# Patient Record
Sex: Male | Born: 1935
Health system: Southern US, Community
[De-identification: ages and names within clinical notes are randomized; demographics above are authoritative.]

## PROBLEM LIST (undated history)

## (undated) DIAGNOSIS — G629 Polyneuropathy, unspecified: Secondary | ICD-10-CM

## (undated) DIAGNOSIS — I714 Abdominal aortic aneurysm, without rupture, unspecified: Secondary | ICD-10-CM

## (undated) DIAGNOSIS — J219 Acute bronchiolitis, unspecified: Secondary | ICD-10-CM

## (undated) DIAGNOSIS — I219 Acute myocardial infarction, unspecified: Secondary | ICD-10-CM

## (undated) DIAGNOSIS — K4090 Unilateral inguinal hernia, without obstruction or gangrene, not specified as recurrent: Secondary | ICD-10-CM

## (undated) DIAGNOSIS — K635 Polyp of colon: Secondary | ICD-10-CM

## (undated) DIAGNOSIS — M1990 Unspecified osteoarthritis, unspecified site: Secondary | ICD-10-CM

## (undated) DIAGNOSIS — J449 Chronic obstructive pulmonary disease, unspecified: Secondary | ICD-10-CM

## (undated) DIAGNOSIS — N529 Male erectile dysfunction, unspecified: Secondary | ICD-10-CM

## (undated) DIAGNOSIS — G5602 Carpal tunnel syndrome, left upper limb: Secondary | ICD-10-CM

## (undated) DIAGNOSIS — R0600 Dyspnea, unspecified: Secondary | ICD-10-CM

## (undated) DIAGNOSIS — E785 Hyperlipidemia, unspecified: Secondary | ICD-10-CM

## (undated) DIAGNOSIS — Z923 Personal history of irradiation: Secondary | ICD-10-CM

## (undated) DIAGNOSIS — I10 Essential (primary) hypertension: Secondary | ICD-10-CM

## (undated) DIAGNOSIS — I2510 Atherosclerotic heart disease of native coronary artery without angina pectoris: Secondary | ICD-10-CM

## (undated) DIAGNOSIS — H3530 Unspecified macular degeneration: Secondary | ICD-10-CM

## (undated) HISTORY — DX: Essential (primary) hypertension: I10

## (undated) HISTORY — DX: Polyneuropathy, unspecified: G62.9

## (undated) HISTORY — PX: INCISION AND DRAINAGE PERIRECTAL ABSCESS: SHX1804

## (undated) HISTORY — DX: Abdominal aortic aneurysm, without rupture, unspecified: I71.40

## (undated) HISTORY — PX: COLONOSCOPY: SHX174

## (undated) HISTORY — DX: Hyperlipidemia, unspecified: E78.5

## (undated) HISTORY — PX: INGUINAL HERNIA REPAIR: SHX194

## (undated) HISTORY — PX: OTHER SURGICAL HISTORY: SHX169

## (undated) HISTORY — DX: Male erectile dysfunction, unspecified: N52.9

## (undated) HISTORY — DX: Atherosclerotic heart disease of native coronary artery without angina pectoris: I25.10

## (undated) HISTORY — PX: HERNIA REPAIR: SHX51

## (undated) HISTORY — DX: Polyp of colon: K63.5

## (undated) HISTORY — DX: Acute bronchiolitis, unspecified: J21.9

## (undated) HISTORY — DX: Chronic obstructive pulmonary disease, unspecified: J44.9

## (undated) HISTORY — PX: EYE SURGERY: SHX253

## (undated) HISTORY — DX: Unspecified macular degeneration: H35.30

## (undated) HISTORY — DX: Acute myocardial infarction, unspecified: I21.9

## (undated) HISTORY — DX: Personal history of irradiation: Z92.3

## (undated) HISTORY — DX: Abdominal aortic aneurysm, without rupture: I71.4

---

## 1998-09-28 ENCOUNTER — Encounter: Admission: RE | Admit: 1998-09-28 | Discharge: 1998-12-27 | Payer: Self-pay | Admitting: Internal Medicine

## 1999-09-05 ENCOUNTER — Ambulatory Visit (HOSPITAL_BASED_OUTPATIENT_CLINIC_OR_DEPARTMENT_OTHER): Admission: RE | Admit: 1999-09-05 | Discharge: 1999-09-05 | Payer: Self-pay | Admitting: Orthopedic Surgery

## 2000-03-19 ENCOUNTER — Ambulatory Visit (HOSPITAL_BASED_OUTPATIENT_CLINIC_OR_DEPARTMENT_OTHER): Admission: RE | Admit: 2000-03-19 | Discharge: 2000-03-19 | Payer: Self-pay | Admitting: Orthopedic Surgery

## 2001-12-17 ENCOUNTER — Encounter: Payer: Self-pay | Admitting: Internal Medicine

## 2001-12-17 ENCOUNTER — Ambulatory Visit (HOSPITAL_COMMUNITY): Admission: RE | Admit: 2001-12-17 | Discharge: 2001-12-17 | Payer: Self-pay | Admitting: Internal Medicine

## 2003-12-31 ENCOUNTER — Ambulatory Visit: Payer: Self-pay | Admitting: Internal Medicine

## 2004-02-17 ENCOUNTER — Ambulatory Visit: Payer: Self-pay | Admitting: Internal Medicine

## 2004-03-30 ENCOUNTER — Ambulatory Visit: Payer: Self-pay | Admitting: Internal Medicine

## 2004-05-26 ENCOUNTER — Ambulatory Visit: Payer: Self-pay | Admitting: Internal Medicine

## 2004-07-28 ENCOUNTER — Ambulatory Visit: Payer: Self-pay | Admitting: Internal Medicine

## 2004-11-30 ENCOUNTER — Ambulatory Visit: Payer: Self-pay | Admitting: Internal Medicine

## 2004-12-07 ENCOUNTER — Ambulatory Visit: Payer: Self-pay | Admitting: Internal Medicine

## 2005-01-15 ENCOUNTER — Ambulatory Visit: Payer: Self-pay | Admitting: Internal Medicine

## 2005-05-02 ENCOUNTER — Ambulatory Visit: Payer: Self-pay | Admitting: Internal Medicine

## 2005-05-08 ENCOUNTER — Ambulatory Visit: Payer: Self-pay | Admitting: Internal Medicine

## 2005-09-07 ENCOUNTER — Ambulatory Visit: Payer: Self-pay | Admitting: Internal Medicine

## 2005-09-18 ENCOUNTER — Ambulatory Visit: Payer: Self-pay | Admitting: Internal Medicine

## 2006-01-22 ENCOUNTER — Ambulatory Visit: Payer: Self-pay | Admitting: Internal Medicine

## 2006-01-22 LAB — CONVERTED CEMR LAB
BUN: 13 mg/dL (ref 6–23)
Potassium: 4.5 meq/L (ref 3.5–5.1)
Uric Acid, Serum: 5.5 mg/dL (ref 2.4–7.0)

## 2006-01-29 ENCOUNTER — Ambulatory Visit: Payer: Self-pay | Admitting: Internal Medicine

## 2006-05-22 ENCOUNTER — Ambulatory Visit: Payer: Self-pay | Admitting: Internal Medicine

## 2006-05-24 ENCOUNTER — Ambulatory Visit: Payer: Self-pay | Admitting: Internal Medicine

## 2006-05-29 ENCOUNTER — Ambulatory Visit: Payer: Self-pay | Admitting: Internal Medicine

## 2006-05-29 LAB — CONVERTED CEMR LAB
Hgb A1c MFr Bld: 6.1 % — ABNORMAL HIGH (ref 4.6–6.0)
Uric Acid, Serum: 5.2 mg/dL (ref 2.4–7.0)

## 2006-09-20 ENCOUNTER — Ambulatory Visit: Payer: Self-pay | Admitting: Internal Medicine

## 2006-09-23 DIAGNOSIS — Z8739 Personal history of other diseases of the musculoskeletal system and connective tissue: Secondary | ICD-10-CM

## 2006-09-24 ENCOUNTER — Ambulatory Visit: Payer: Self-pay | Admitting: Internal Medicine

## 2006-09-24 DIAGNOSIS — E785 Hyperlipidemia, unspecified: Secondary | ICD-10-CM | POA: Insufficient documentation

## 2006-09-24 DIAGNOSIS — E1169 Type 2 diabetes mellitus with other specified complication: Secondary | ICD-10-CM | POA: Insufficient documentation

## 2006-09-24 DIAGNOSIS — R7303 Prediabetes: Secondary | ICD-10-CM

## 2006-09-24 LAB — CONVERTED CEMR LAB
Cholesterol, target level: 200 mg/dL
HDL goal, serum: 40 mg/dL
LDL Goal: 100 mg/dL

## 2006-09-30 ENCOUNTER — Telehealth (INDEPENDENT_AMBULATORY_CARE_PROVIDER_SITE_OTHER): Payer: Self-pay | Admitting: *Deleted

## 2006-10-02 LAB — CONVERTED CEMR LAB
Basophils Relative: 1.1 % — ABNORMAL HIGH (ref 0.0–1.0)
Eosinophils Relative: 3.4 % (ref 0.0–5.0)
Folate: 10.5 ng/mL
HCT: 40.3 % (ref 39.0–52.0)
Iron: 75 ug/dL (ref 42–165)
Neutrophils Relative %: 50.7 % (ref 43.0–77.0)
RBC: 4.33 M/uL (ref 4.22–5.81)
RDW: 13.2 % (ref 11.5–14.6)
Transferrin: 268.6 mg/dL (ref >=212.0)
WBC: 5.6 10*3/uL (ref 4.5–10.5)

## 2006-10-03 ENCOUNTER — Encounter (INDEPENDENT_AMBULATORY_CARE_PROVIDER_SITE_OTHER): Payer: Self-pay | Admitting: *Deleted

## 2006-11-25 ENCOUNTER — Telehealth (INDEPENDENT_AMBULATORY_CARE_PROVIDER_SITE_OTHER): Payer: Self-pay | Admitting: *Deleted

## 2006-12-03 ENCOUNTER — Ambulatory Visit: Payer: Self-pay | Admitting: Internal Medicine

## 2006-12-08 LAB — CONVERTED CEMR LAB
HDL: 42.8 mg/dL (ref >39.0)
Total CK: 69 units/L (ref 7–195)
Triglycerides: 139 mg/dL (ref 0–149)

## 2006-12-09 ENCOUNTER — Encounter (INDEPENDENT_AMBULATORY_CARE_PROVIDER_SITE_OTHER): Payer: Self-pay | Admitting: *Deleted

## 2007-01-01 ENCOUNTER — Ambulatory Visit: Payer: Self-pay | Admitting: Internal Medicine

## 2007-01-01 ENCOUNTER — Telehealth (INDEPENDENT_AMBULATORY_CARE_PROVIDER_SITE_OTHER): Payer: Self-pay | Admitting: *Deleted

## 2007-02-27 ENCOUNTER — Telehealth (INDEPENDENT_AMBULATORY_CARE_PROVIDER_SITE_OTHER): Payer: Self-pay | Admitting: *Deleted

## 2007-03-11 ENCOUNTER — Encounter: Payer: Self-pay | Admitting: Internal Medicine

## 2007-05-22 ENCOUNTER — Telehealth (INDEPENDENT_AMBULATORY_CARE_PROVIDER_SITE_OTHER): Payer: Self-pay | Admitting: *Deleted

## 2007-06-11 ENCOUNTER — Ambulatory Visit: Payer: Self-pay | Admitting: Gastroenterology

## 2007-06-19 ENCOUNTER — Ambulatory Visit: Payer: Self-pay | Admitting: Gastroenterology

## 2007-06-19 ENCOUNTER — Encounter: Payer: Self-pay | Admitting: Internal Medicine

## 2007-07-16 ENCOUNTER — Ambulatory Visit: Payer: Self-pay | Admitting: Internal Medicine

## 2007-07-16 DIAGNOSIS — D126 Benign neoplasm of colon, unspecified: Secondary | ICD-10-CM | POA: Insufficient documentation

## 2007-07-19 LAB — CONVERTED CEMR LAB
ALT: 15 units/L (ref 0–53)
Basophils Absolute: 0 10*3/uL (ref 0.0–0.1)
Basophils Relative: 0.7 % (ref 0.0–1.0)
Bilirubin, Direct: 0.1 mg/dL (ref 0.0–0.3)
CO2: 28 meq/L (ref 19–32)
Calcium: 9.6 mg/dL (ref 8.4–10.5)
Cholesterol: 173 mg/dL (ref 0–200)
Creatinine, Ser: 1.1 mg/dL (ref 0.4–1.5)
Creatinine,U: 179.8 mg/dL
Eosinophils Absolute: 0.3 10*3/uL (ref 0.0–0.7)
GFR calc non Af Amer: 70 mL/min
Hemoglobin: 13.9 g/dL (ref 13.0–17.0)
Hgb A1c MFr Bld: 5.9 % (ref 4.6–6.0)
LDL Cholesterol: 101 mg/dL — ABNORMAL HIGH (ref 0–99)
Lymphocytes Relative: 35.4 % (ref 12.0–46.0)
MCHC: 33.3 g/dL (ref 30.0–36.0)
MCV: 94.9 fL (ref 78.0–100.0)
Microalb Creat Ratio: 17.8 mg/g (ref 0.0–30.0)
Microalb, Ur: 3.2 mg/dL — ABNORMAL HIGH (ref 0.0–1.9)
Neutro Abs: 2.8 10*3/uL (ref 1.4–7.7)
Neutrophils Relative %: 47.8 % (ref 43.0–77.0)
PSA: 1.42 ng/mL (ref 0.10–4.00)
RDW: 13.3 % (ref 11.5–14.6)
Sodium: 140 meq/L (ref 135–145)
Total Bilirubin: 1.1 mg/dL (ref 0.3–1.2)
Triglycerides: 119 mg/dL (ref 0–149)
Uric Acid, Serum: 5.6 mg/dL (ref 4.0–7.8)
VLDL: 24 mg/dL (ref 0–40)

## 2007-07-21 ENCOUNTER — Encounter (INDEPENDENT_AMBULATORY_CARE_PROVIDER_SITE_OTHER): Payer: Self-pay | Admitting: *Deleted

## 2007-08-28 ENCOUNTER — Encounter: Payer: Self-pay | Admitting: Internal Medicine

## 2007-08-28 ENCOUNTER — Telehealth (INDEPENDENT_AMBULATORY_CARE_PROVIDER_SITE_OTHER): Payer: Self-pay | Admitting: *Deleted

## 2007-12-18 ENCOUNTER — Telehealth (INDEPENDENT_AMBULATORY_CARE_PROVIDER_SITE_OTHER): Payer: Self-pay | Admitting: *Deleted

## 2008-02-16 ENCOUNTER — Telehealth (INDEPENDENT_AMBULATORY_CARE_PROVIDER_SITE_OTHER): Payer: Self-pay | Admitting: *Deleted

## 2008-07-15 ENCOUNTER — Ambulatory Visit: Payer: Self-pay | Admitting: Internal Medicine

## 2008-07-15 DIAGNOSIS — M545 Low back pain, unspecified: Secondary | ICD-10-CM | POA: Insufficient documentation

## 2008-07-16 ENCOUNTER — Encounter (INDEPENDENT_AMBULATORY_CARE_PROVIDER_SITE_OTHER): Payer: Self-pay | Admitting: *Deleted

## 2008-07-16 LAB — CONVERTED CEMR LAB
Hgb A1c MFr Bld: 6.2 % (ref 4.6–6.5)
Microalb Creat Ratio: 3.6 mg/g (ref 0.0–30.0)

## 2008-07-23 ENCOUNTER — Telehealth (INDEPENDENT_AMBULATORY_CARE_PROVIDER_SITE_OTHER): Payer: Self-pay | Admitting: *Deleted

## 2008-07-26 ENCOUNTER — Ambulatory Visit: Payer: Self-pay | Admitting: Internal Medicine

## 2008-07-26 DIAGNOSIS — M542 Cervicalgia: Secondary | ICD-10-CM | POA: Insufficient documentation

## 2008-08-20 ENCOUNTER — Encounter: Payer: Self-pay | Admitting: Internal Medicine

## 2008-11-17 ENCOUNTER — Ambulatory Visit: Payer: Self-pay | Admitting: Internal Medicine

## 2008-11-17 DIAGNOSIS — IMO0002 Reserved for concepts with insufficient information to code with codable children: Secondary | ICD-10-CM

## 2008-12-01 ENCOUNTER — Ambulatory Visit: Payer: Self-pay | Admitting: Internal Medicine

## 2008-12-09 ENCOUNTER — Ambulatory Visit: Payer: Self-pay | Admitting: Internal Medicine

## 2008-12-28 ENCOUNTER — Ambulatory Visit: Payer: Self-pay | Admitting: Internal Medicine

## 2008-12-28 DIAGNOSIS — G479 Sleep disorder, unspecified: Secondary | ICD-10-CM

## 2009-01-11 LAB — CONVERTED CEMR LAB
AST: 15 units/L (ref 0–37)
BUN: 12 mg/dL (ref 6–23)
Cholesterol: 148 mg/dL (ref 0–200)
HDL: 43.9 mg/dL (ref >39.00)
Hgb A1c MFr Bld: 6.4 % (ref 4.6–6.5)
LDL Cholesterol: 91 mg/dL (ref 0–99)
Triglycerides: 65 mg/dL (ref 0.0–149.0)
VLDL: 13 mg/dL (ref 0.0–40.0)

## 2009-02-08 ENCOUNTER — Telehealth (INDEPENDENT_AMBULATORY_CARE_PROVIDER_SITE_OTHER): Payer: Self-pay | Admitting: *Deleted

## 2009-03-31 ENCOUNTER — Encounter: Payer: Self-pay | Admitting: Internal Medicine

## 2009-05-12 ENCOUNTER — Telehealth: Payer: Self-pay | Admitting: Internal Medicine

## 2009-06-06 ENCOUNTER — Encounter: Payer: Self-pay | Admitting: Internal Medicine

## 2009-07-08 ENCOUNTER — Ambulatory Visit: Payer: Self-pay | Admitting: Internal Medicine

## 2009-07-11 LAB — CONVERTED CEMR LAB
Creatinine,U: 288.9 mg/dL
Hgb A1c MFr Bld: 6 % (ref 4.6–6.5)
Microalb Creat Ratio: 0.5 mg/g (ref 0.0–30.0)

## 2009-07-20 ENCOUNTER — Ambulatory Visit: Payer: Self-pay | Admitting: Internal Medicine

## 2009-07-20 DIAGNOSIS — I714 Abdominal aortic aneurysm, without rupture, unspecified: Secondary | ICD-10-CM | POA: Insufficient documentation

## 2009-09-22 ENCOUNTER — Encounter: Payer: Self-pay | Admitting: Internal Medicine

## 2009-11-24 ENCOUNTER — Ambulatory Visit: Payer: Self-pay | Admitting: Internal Medicine

## 2009-11-28 ENCOUNTER — Encounter: Payer: Self-pay | Admitting: Internal Medicine

## 2010-02-19 HISTORY — PX: CORONARY ARTERY BYPASS GRAFT: SHX141

## 2010-02-27 ENCOUNTER — Emergency Department (HOSPITAL_COMMUNITY)
Admission: EM | Admit: 2010-02-27 | Discharge: 2010-02-27 | Payer: Self-pay | Source: Home / Self Care | Admitting: Emergency Medicine

## 2010-02-27 ENCOUNTER — Inpatient Hospital Stay (HOSPITAL_COMMUNITY)
Admission: EM | Admit: 2010-02-27 | Discharge: 2010-03-11 | Payer: Self-pay | Source: Home / Self Care | Attending: Cardiothoracic Surgery | Admitting: Cardiothoracic Surgery

## 2010-02-28 ENCOUNTER — Encounter: Payer: Self-pay | Admitting: Cardiovascular Disease

## 2010-02-28 ENCOUNTER — Encounter: Payer: Self-pay | Admitting: Cardiothoracic Surgery

## 2010-03-01 ENCOUNTER — Encounter: Payer: Self-pay | Admitting: Cardiothoracic Surgery

## 2010-03-03 DIAGNOSIS — I219 Acute myocardial infarction, unspecified: Secondary | ICD-10-CM

## 2010-03-03 HISTORY — DX: Acute myocardial infarction, unspecified: I21.9

## 2010-03-03 HISTORY — PX: OTHER SURGICAL HISTORY: SHX169

## 2010-03-06 LAB — POCT I-STAT 4, (NA,K, GLUC, HGB,HCT)
Glucose, Bld: 136 mg/dL — ABNORMAL HIGH (ref 70–99)
Glucose, Bld: 114 mg/dL — ABNORMAL HIGH (ref 70–99)
Glucose, Bld: 98 mg/dL (ref 70–99)
Glucose, Bld: 113 mg/dL — ABNORMAL HIGH (ref 70–99)
Glucose, Bld: 147 mg/dL — ABNORMAL HIGH (ref 70–99)
Glucose, Bld: 96 mg/dL (ref 70–99)
HCT: 32 % — ABNORMAL LOW (ref 39.0–52.0)
HCT: 26 % — ABNORMAL LOW (ref 39.0–52.0)
HCT: 29 % — ABNORMAL LOW (ref 39.0–52.0)
HCT: 26 % — ABNORMAL LOW (ref 39.0–52.0)
HCT: 26 % — ABNORMAL LOW (ref 39.0–52.0)
HCT: 27 % — ABNORMAL LOW (ref 39.0–52.0)
Hemoglobin: 10.9 g/dL — ABNORMAL LOW (ref 13.0–17.0)
Hemoglobin: 8.8 g/dL — ABNORMAL LOW (ref 13.0–17.0)
Hemoglobin: 9.9 g/dL — ABNORMAL LOW (ref 13.0–17.0)
Hemoglobin: 8.8 g/dL — ABNORMAL LOW (ref 13.0–17.0)
Hemoglobin: 8.8 g/dL — ABNORMAL LOW (ref 13.0–17.0)
Hemoglobin: 9.2 g/dL — ABNORMAL LOW (ref 13.0–17.0)
Potassium: 3.8 mEq/L (ref 3.5–5.1)
Potassium: 3.8 mEq/L (ref 3.5–5.1)
Potassium: 4.1 mEq/L (ref 3.5–5.1)
Potassium: 4.7 mEq/L (ref 3.5–5.1)
Potassium: 4.6 mEq/L (ref 3.5–5.1)
Potassium: 4 mEq/L (ref 3.5–5.1)
Sodium: 140 mEq/L (ref 135–145)
Sodium: 137 mEq/L (ref 135–145)
Sodium: 140 mEq/L (ref 135–145)
Sodium: 138 mEq/L (ref 135–145)
Sodium: 135 mEq/L (ref 135–145)
Sodium: 138 mEq/L (ref 135–145)

## 2010-03-06 LAB — CBC
HCT: 41.4 % (ref 39.0–52.0)
HCT: 37 % — ABNORMAL LOW (ref 39.0–52.0)
HCT: 37.4 % — ABNORMAL LOW (ref 39.0–52.0)
HCT: 39.4 % (ref 39.0–52.0)
HCT: 38.9 % — ABNORMAL LOW (ref 39.0–52.0)
HCT: 26.7 % — ABNORMAL LOW (ref 39.0–52.0)
HCT: 27.9 % — ABNORMAL LOW (ref 39.0–52.0)
HCT: 28.6 % — ABNORMAL LOW (ref 39.0–52.0)
HCT: 41.1 % (ref 39.0–52.0)
HCT: 26.7 % — ABNORMAL LOW (ref 39.0–52.0)
Hemoglobin: 13.9 g/dL (ref 13.0–17.0)
Hemoglobin: 12.5 g/dL — ABNORMAL LOW (ref 13.0–17.0)
Hemoglobin: 12.6 g/dL — ABNORMAL LOW (ref 13.0–17.0)
Hemoglobin: 13.3 g/dL (ref 13.0–17.0)
Hemoglobin: 13.3 g/dL (ref 13.0–17.0)
Hemoglobin: 9.1 g/dL — ABNORMAL LOW (ref 13.0–17.0)
Hemoglobin: 9.6 g/dL — ABNORMAL LOW (ref 13.0–17.0)
Hemoglobin: 9.7 g/dL — ABNORMAL LOW (ref 13.0–17.0)
Hemoglobin: 13.7 g/dL (ref 13.0–17.0)
Hemoglobin: 9 g/dL — ABNORMAL LOW (ref 13.0–17.0)
MCH: 30.5 pg (ref 26.0–34.0)
MCH: 30.9 pg (ref 26.0–34.0)
MCH: 30.9 pg (ref 26.0–34.0)
MCH: 30.9 pg (ref 26.0–34.0)
MCH: 31.4 pg (ref 26.0–34.0)
MCH: 31 pg (ref 26.0–34.0)
MCH: 31.4 pg (ref 26.0–34.0)
MCH: 30.9 pg (ref 26.0–34.0)
MCH: 30.9 pg (ref 26.0–34.0)
MCH: 31.4 pg (ref 26.0–34.0)
MCHC: 33.6 g/dL (ref 30.0–36.0)
MCHC: 33.8 g/dL (ref 30.0–36.0)
MCHC: 33.7 g/dL (ref 30.0–36.0)
MCHC: 33.8 g/dL (ref 30.0–36.0)
MCHC: 34.2 g/dL (ref 30.0–36.0)
MCHC: 34.1 g/dL (ref 30.0–36.0)
MCHC: 34.4 g/dL (ref 30.0–36.0)
MCHC: 33.9 g/dL (ref 30.0–36.0)
MCHC: 33.3 g/dL (ref 30.0–36.0)
MCHC: 33.7 g/dL (ref 30.0–36.0)
MCV: 90.8 fL (ref 78.0–100.0)
MCV: 91.6 fL (ref 78.0–100.0)
MCV: 91.7 fL (ref 78.0–100.0)
MCV: 91.4 fL (ref 78.0–100.0)
MCV: 91.7 fL (ref 78.0–100.0)
MCV: 90.8 fL (ref 78.0–100.0)
MCV: 91.2 fL (ref 78.0–100.0)
MCV: 91.1 fL (ref 78.0–100.0)
MCV: 92.8 fL (ref 78.0–100.0)
MCV: 93 fL (ref 78.0–100.0)
Platelets: 186 10*3/uL (ref 150–400)
Platelets: 163 10*3/uL (ref 150–400)
Platelets: 163 10*3/uL (ref 150–400)
Platelets: 185 10*3/uL (ref 150–400)
Platelets: 177 10*3/uL (ref 150–400)
Platelets: 98 10*3/uL — ABNORMAL LOW (ref 150–400)
Platelets: 144 10*3/uL — ABNORMAL LOW (ref 150–400)
Platelets: 147 10*3/uL — ABNORMAL LOW (ref 150–400)
Platelets: 59 10*3/uL — ABNORMAL LOW (ref 150–400)
Platelets: 155 10*3/uL (ref 150–400)
RBC: 4.56 MIL/uL (ref 4.22–5.81)
RBC: 4.04 MIL/uL — ABNORMAL LOW (ref 4.22–5.81)
RBC: 4.08 MIL/uL — ABNORMAL LOW (ref 4.22–5.81)
RBC: 4.31 MIL/uL (ref 4.22–5.81)
RBC: 4.24 MIL/uL (ref 4.22–5.81)
RBC: 2.94 MIL/uL — ABNORMAL LOW (ref 4.22–5.81)
RBC: 3.06 MIL/uL — ABNORMAL LOW (ref 4.22–5.81)
RBC: 3.14 MIL/uL — ABNORMAL LOW (ref 4.22–5.81)
RBC: 4.43 MIL/uL (ref 4.22–5.81)
RBC: 2.87 MIL/uL — ABNORMAL LOW (ref 4.22–5.81)
RDW: 12.6 % (ref 11.5–15.5)
RDW: 12.6 % (ref 11.5–15.5)
RDW: 12.6 % (ref 11.5–15.5)
RDW: 12.6 % (ref 11.5–15.5)
RDW: 12.6 % (ref 11.5–15.5)
RDW: 12.5 % (ref 11.5–15.5)
RDW: 12.7 % (ref 11.5–15.5)
RDW: 12.8 % (ref 11.5–15.5)
RDW: 13.2 % (ref 11.5–15.5)
RDW: 13.1 % (ref 11.5–15.5)
WBC: 6.8 10*3/uL (ref 4.0–10.5)
WBC: 7.5 10*3/uL (ref 4.0–10.5)
WBC: 8 10*3/uL (ref 4.0–10.5)
WBC: 6.5 10*3/uL (ref 4.0–10.5)
WBC: 8.4 10*3/uL (ref 4.0–10.5)
WBC: 8.7 10*3/uL (ref 4.0–10.5)
WBC: 10.8 10*3/uL — ABNORMAL HIGH (ref 4.0–10.5)
WBC: 11.1 10*3/uL — ABNORMAL HIGH (ref 4.0–10.5)
WBC: 7.7 10*3/uL (ref 4.0–10.5)
WBC: 10.6 10*3/uL — ABNORMAL HIGH (ref 4.0–10.5)

## 2010-03-06 LAB — CARDIAC PANEL(CRET KIN+CKTOT+MB+TROPI)
CK, MB: 91.8 ng/mL (ref 0.3–4.0)
CK, MB: 133.9 ng/mL (ref 0.3–4.0)
CK, MB: 117.3 ng/mL (ref 0.3–4.0)
Relative Index: 17.8 — ABNORMAL HIGH (ref 0.0–2.5)
Relative Index: 17.3 — ABNORMAL HIGH (ref 0.0–2.5)
Relative Index: 14.7 — ABNORMAL HIGH (ref 0.0–2.5)
Total CK: 517 U/L — ABNORMAL HIGH (ref 7–232)
Total CK: 775 U/L — ABNORMAL HIGH (ref 7–232)
Total CK: 799 U/L — ABNORMAL HIGH (ref 7–232)
Troponin I: 3.24 ng/mL (ref 0.00–0.06)
Troponin I: 9.11 ng/mL (ref 0.00–0.06)
Troponin I: 11.68 ng/mL (ref 0.00–0.06)

## 2010-03-06 LAB — GLUCOSE, CAPILLARY
Glucose-Capillary: 128 mg/dL — ABNORMAL HIGH (ref 70–99)
Glucose-Capillary: 105 mg/dL — ABNORMAL HIGH (ref 70–99)
Glucose-Capillary: 184 mg/dL — ABNORMAL HIGH (ref 70–99)
Glucose-Capillary: 149 mg/dL — ABNORMAL HIGH (ref 70–99)
Glucose-Capillary: 153 mg/dL — ABNORMAL HIGH (ref 70–99)
Glucose-Capillary: 127 mg/dL — ABNORMAL HIGH (ref 70–99)
Glucose-Capillary: 109 mg/dL — ABNORMAL HIGH (ref 70–99)
Glucose-Capillary: 104 mg/dL — ABNORMAL HIGH (ref 70–99)
Glucose-Capillary: 143 mg/dL — ABNORMAL HIGH (ref 70–99)
Glucose-Capillary: 103 mg/dL — ABNORMAL HIGH (ref 70–99)
Glucose-Capillary: 111 mg/dL — ABNORMAL HIGH (ref 70–99)
Glucose-Capillary: 167 mg/dL — ABNORMAL HIGH (ref 70–99)
Glucose-Capillary: 88 mg/dL (ref 70–99)
Glucose-Capillary: 115 mg/dL — ABNORMAL HIGH (ref 70–99)
Glucose-Capillary: 93 mg/dL (ref 70–99)
Glucose-Capillary: 103 mg/dL — ABNORMAL HIGH (ref 70–99)
Glucose-Capillary: 106 mg/dL — ABNORMAL HIGH (ref 70–99)
Glucose-Capillary: 141 mg/dL — ABNORMAL HIGH (ref 70–99)
Glucose-Capillary: 58 mg/dL — ABNORMAL LOW (ref 70–99)
Glucose-Capillary: 65 mg/dL — ABNORMAL LOW (ref 70–99)
Glucose-Capillary: 96 mg/dL (ref 70–99)
Glucose-Capillary: 122 mg/dL — ABNORMAL HIGH (ref 70–99)
Glucose-Capillary: 126 mg/dL — ABNORMAL HIGH (ref 70–99)
Glucose-Capillary: 117 mg/dL — ABNORMAL HIGH (ref 70–99)
Glucose-Capillary: 117 mg/dL — ABNORMAL HIGH (ref 70–99)
Glucose-Capillary: 123 mg/dL — ABNORMAL HIGH (ref 70–99)
Glucose-Capillary: 119 mg/dL — ABNORMAL HIGH (ref 70–99)
Glucose-Capillary: 181 mg/dL — ABNORMAL HIGH (ref 70–99)
Glucose-Capillary: 197 mg/dL — ABNORMAL HIGH (ref 70–99)
Glucose-Capillary: 187 mg/dL — ABNORMAL HIGH (ref 70–99)
Glucose-Capillary: 188 mg/dL — ABNORMAL HIGH (ref 70–99)
Glucose-Capillary: 174 mg/dL — ABNORMAL HIGH (ref 70–99)
Glucose-Capillary: 223 mg/dL — ABNORMAL HIGH (ref 70–99)

## 2010-03-06 LAB — CROSSMATCH
ABO/RH(D): B POS
Antibody Screen: NEGATIVE
Unit division: 0
Unit division: 0

## 2010-03-06 LAB — BASIC METABOLIC PANEL
BUN: 12 mg/dL (ref 6–23)
BUN: 15 mg/dL (ref 6–23)
BUN: 14 mg/dL (ref 6–23)
BUN: 10 mg/dL (ref 6–23)
BUN: 28 mg/dL — ABNORMAL HIGH (ref 6–23)
CO2: 23 mEq/L (ref 19–32)
CO2: 24 mEq/L (ref 19–32)
CO2: 25 mEq/L (ref 19–32)
CO2: 21 mEq/L (ref 19–32)
CO2: 24 mEq/L (ref 19–32)
Calcium: 8.6 mg/dL (ref 8.4–10.5)
Calcium: 8.7 mg/dL (ref 8.4–10.5)
Calcium: 9 mg/dL (ref 8.4–10.5)
Calcium: 7.8 mg/dL — ABNORMAL LOW (ref 8.4–10.5)
Calcium: 8 mg/dL — ABNORMAL LOW (ref 8.4–10.5)
Chloride: 104 mEq/L (ref 96–112)
Chloride: 106 mEq/L (ref 96–112)
Chloride: 109 mEq/L (ref 96–112)
Chloride: 109 mEq/L (ref 96–112)
Chloride: 106 mEq/L (ref 96–112)
Creatinine, Ser: 1.3 mg/dL (ref 0.4–1.5)
Creatinine, Ser: 1.35 mg/dL (ref 0.4–1.5)
Creatinine, Ser: 1.41 mg/dL (ref 0.4–1.5)
Creatinine, Ser: 1.29 mg/dL (ref 0.4–1.5)
Creatinine, Ser: 2.08 mg/dL — ABNORMAL HIGH (ref 0.4–1.5)
GFR calc Af Amer: >60 mL/min (ref >60)
GFR calc Af Amer: >60 mL/min (ref >60)
GFR calc Af Amer: 59 mL/min — ABNORMAL LOW (ref >60)
GFR calc Af Amer: >60 mL/min (ref >60)
GFR calc Af Amer: 38 mL/min — ABNORMAL LOW (ref >60)
GFR calc non Af Amer: 54 mL/min — ABNORMAL LOW (ref >60)
GFR calc non Af Amer: 51 mL/min — ABNORMAL LOW (ref >60)
GFR calc non Af Amer: 49 mL/min — ABNORMAL LOW (ref >60)
GFR calc non Af Amer: 54 mL/min — ABNORMAL LOW (ref >60)
GFR calc non Af Amer: 31 mL/min — ABNORMAL LOW (ref >60)
Glucose, Bld: 106 mg/dL — ABNORMAL HIGH (ref 70–99)
Glucose, Bld: 98 mg/dL (ref 70–99)
Glucose, Bld: 129 mg/dL — ABNORMAL HIGH (ref 70–99)
Glucose, Bld: 127 mg/dL — ABNORMAL HIGH (ref 70–99)
Glucose, Bld: 174 mg/dL — ABNORMAL HIGH (ref 70–99)
Potassium: 3.7 mEq/L (ref 3.5–5.1)
Potassium: 3.8 mEq/L (ref 3.5–5.1)
Potassium: 4 mEq/L (ref 3.5–5.1)
Potassium: 4.3 mEq/L (ref 3.5–5.1)
Potassium: 4.5 mEq/L (ref 3.5–5.1)
Sodium: 136 mEq/L (ref 135–145)
Sodium: 139 mEq/L (ref 135–145)
Sodium: 140 mEq/L (ref 135–145)
Sodium: 138 mEq/L (ref 135–145)
Sodium: 137 mEq/L (ref 135–145)

## 2010-03-06 LAB — COMPREHENSIVE METABOLIC PANEL
ALT: 14 U/L (ref 0–53)
ALT: 21 U/L (ref 0–53)
AST: 21 U/L (ref 0–37)
AST: 68 U/L — ABNORMAL HIGH (ref 0–37)
Albumin: 3.8 g/dL (ref 3.5–5.2)
Albumin: 3 g/dL — ABNORMAL LOW (ref 3.5–5.2)
Alkaline Phosphatase: 64 U/L (ref 39–117)
Alkaline Phosphatase: 44 U/L (ref 39–117)
BUN: 22 mg/dL (ref 6–23)
BUN: 14 mg/dL (ref 6–23)
CO2: 25 mEq/L (ref 19–32)
CO2: 24 mEq/L (ref 19–32)
Calcium: 9.7 mg/dL (ref 8.4–10.5)
Calcium: 8.6 mg/dL (ref 8.4–10.5)
Chloride: 108 mEq/L (ref 96–112)
Chloride: 106 mEq/L (ref 96–112)
Creatinine, Ser: 1.5 mg/dL (ref 0.4–1.5)
Creatinine, Ser: 1.23 mg/dL (ref 0.4–1.5)
GFR calc Af Amer: 55 mL/min — ABNORMAL LOW (ref >60)
GFR calc Af Amer: >60 mL/min (ref >60)
GFR calc non Af Amer: 46 mL/min — ABNORMAL LOW (ref >60)
GFR calc non Af Amer: 57 mL/min — ABNORMAL LOW (ref >60)
Glucose, Bld: 155 mg/dL — ABNORMAL HIGH (ref 70–99)
Glucose, Bld: 137 mg/dL — ABNORMAL HIGH (ref 70–99)
Potassium: 4.2 mEq/L (ref 3.5–5.1)
Potassium: 3.9 mEq/L (ref 3.5–5.1)
Sodium: 142 mEq/L (ref 135–145)
Sodium: 138 mEq/L (ref 135–145)
Total Bilirubin: 0.6 mg/dL (ref 0.3–1.2)
Total Bilirubin: 0.6 mg/dL (ref 0.3–1.2)
Total Protein: 7.4 g/dL (ref 6.0–8.3)
Total Protein: 5.9 g/dL — ABNORMAL LOW (ref 6.0–8.3)

## 2010-03-06 LAB — ABO/RH: ABO/RH(D): B POS

## 2010-03-06 LAB — LIPID PANEL
Cholesterol: 136 mg/dL (ref 0–200)
HDL: 47 mg/dL (ref >39)
LDL Cholesterol: 74 mg/dL (ref 0–99)
Total CHOL/HDL Ratio: 2.9 RATIO
Triglycerides: 75 mg/dL (ref <150)
VLDL: 15 mg/dL (ref 0–40)

## 2010-03-06 LAB — CK TOTAL AND CKMB (NOT AT ARMC)
CK, MB: 5.3 ng/mL — ABNORMAL HIGH (ref 0.3–4.0)
Relative Index: 4.5 — ABNORMAL HIGH (ref 0.0–2.5)
Total CK: 118 U/L (ref 7–232)

## 2010-03-06 LAB — DIFFERENTIAL
Basophils Absolute: 0 10*3/uL (ref 0.0–0.1)
Basophils Absolute: 0 10*3/uL (ref 0.0–0.1)
Basophils Relative: 1 % (ref 0–1)
Basophils Relative: 0 % (ref 0–1)
Eosinophils Absolute: 0.3 10*3/uL (ref 0.0–0.7)
Eosinophils Absolute: 0.2 10*3/uL (ref 0.0–0.7)
Eosinophils Relative: 4 % (ref 0–5)
Eosinophils Relative: 2 % (ref 0–5)
Lymphocytes Relative: 32 % (ref 12–46)
Lymphocytes Relative: 32 % (ref 12–46)
Lymphs Abs: 2.2 10*3/uL (ref 0.7–4.0)
Lymphs Abs: 2.4 10*3/uL (ref 0.7–4.0)
Monocytes Absolute: 0.6 10*3/uL (ref 0.1–1.0)
Monocytes Absolute: 0.9 10*3/uL (ref 0.1–1.0)
Monocytes Relative: 8 % (ref 3–12)
Monocytes Relative: 11 % (ref 3–12)
Neutro Abs: 3.7 10*3/uL (ref 1.7–7.7)
Neutro Abs: 4.1 10*3/uL (ref 1.7–7.7)
Neutrophils Relative %: 55 % (ref 43–77)
Neutrophils Relative %: 54 % (ref 43–77)

## 2010-03-06 LAB — POCT CARDIAC MARKERS
CKMB, poc: 1.7 ng/mL (ref 1.0–8.0)
Myoglobin, poc: 107 ng/mL (ref 12–200)
Troponin i, poc: <0.05 ng/mL (ref 0.00–0.09)

## 2010-03-06 LAB — MAGNESIUM
Magnesium: 1.6 mg/dL (ref 1.5–2.5)
Magnesium: 2.4 mg/dL (ref 1.5–2.5)
Magnesium: 2.3 mg/dL (ref 1.5–2.5)

## 2010-03-06 LAB — POCT I-STAT 3, ART BLOOD GAS (G3+)
Acid-base deficit: 2 mmol/L (ref 0.0–2.0)
Acid-base deficit: 2 mmol/L (ref 0.0–2.0)
Acid-base deficit: 2 mmol/L (ref 0.0–2.0)
Acid-base deficit: 2 mmol/L (ref 0.0–2.0)
Acid-base deficit: 6 mmol/L — ABNORMAL HIGH (ref 0.0–2.0)
Acid-base deficit: 4 mmol/L — ABNORMAL HIGH (ref 0.0–2.0)
Bicarbonate: 23.7 mEq/L (ref 20.0–24.0)
Bicarbonate: 23 mEq/L (ref 20.0–24.0)
Bicarbonate: 22.8 mEq/L (ref 20.0–24.0)
Bicarbonate: 23.3 mEq/L (ref 20.0–24.0)
Bicarbonate: 19.2 mEq/L — ABNORMAL LOW (ref 20.0–24.0)
Bicarbonate: 21.5 mEq/L (ref 20.0–24.0)
O2 Saturation: 100 %
O2 Saturation: 100 %
O2 Saturation: 100 %
O2 Saturation: 97 %
O2 Saturation: 95 %
O2 Saturation: 94 %
Patient temperature: 35.3
Patient temperature: 37
Patient temperature: 37.8
TCO2: 25 mmol/L (ref 0–100)
TCO2: 24 mmol/L (ref 0–100)
TCO2: 24 mmol/L (ref 0–100)
TCO2: 25 mmol/L (ref 0–100)
TCO2: 20 mmol/L (ref 0–100)
TCO2: 23 mmol/L (ref 0–100)
pCO2 arterial: 41.5 mmHg (ref 35.0–45.0)
pCO2 arterial: 39.1 mmHg (ref 35.0–45.0)
pCO2 arterial: 38.3 mmHg (ref 35.0–45.0)
pCO2 arterial: 38.9 mmHg (ref 35.0–45.0)
pCO2 arterial: 36 mmHg (ref 35.0–45.0)
pCO2 arterial: 39.4 mmHg (ref 35.0–45.0)
pH, Arterial: 7.364 (ref 7.350–7.450)
pH, Arterial: 7.378 (ref 7.350–7.450)
pH, Arterial: 7.383 (ref 7.350–7.450)
pH, Arterial: 7.378 (ref 7.350–7.450)
pH, Arterial: 7.336 — ABNORMAL LOW (ref 7.350–7.450)
pH, Arterial: 7.348 — ABNORMAL LOW (ref 7.350–7.450)
pO2, Arterial: 363 mmHg — ABNORMAL HIGH (ref 80.0–100.0)
pO2, Arterial: 308 mmHg — ABNORMAL HIGH (ref 80.0–100.0)
pO2, Arterial: 349 mmHg — ABNORMAL HIGH (ref 80.0–100.0)
pO2, Arterial: 87 mmHg (ref 80.0–100.0)
pO2, Arterial: 79 mmHg — ABNORMAL LOW (ref 80.0–100.0)
pO2, Arterial: 76 mmHg — ABNORMAL LOW (ref 80.0–100.0)

## 2010-03-06 LAB — POCT I-STAT, CHEM 8
BUN: 10 mg/dL (ref 6–23)
Calcium, Ion: 1.14 mmol/L (ref 1.12–1.32)
Chloride: 106 mEq/L (ref 96–112)
Creatinine, Ser: 1.1 mg/dL (ref 0.4–1.5)
Glucose, Bld: 147 mg/dL — ABNORMAL HIGH (ref 70–99)
HCT: 28 % — ABNORMAL LOW (ref 39.0–52.0)
Hemoglobin: 9.5 g/dL — ABNORMAL LOW (ref 13.0–17.0)
Potassium: 4.5 mEq/L (ref 3.5–5.1)
Sodium: 137 mEq/L (ref 135–145)
TCO2: 21 mmol/L (ref 0–100)

## 2010-03-06 LAB — PROTIME-INR
INR: 0.93 (ref 0.00–1.49)
INR: 1.02 (ref 0.00–1.49)
INR: 1.64 — ABNORMAL HIGH (ref 0.00–1.49)
Prothrombin Time: 12.7 seconds (ref 11.6–15.2)
Prothrombin Time: 13.6 seconds (ref 11.6–15.2)
Prothrombin Time: 19.6 seconds — ABNORMAL HIGH (ref 11.6–15.2)

## 2010-03-06 LAB — HEPARIN LEVEL (UNFRACTIONATED)
Heparin Unfractionated: 0.31 IU/mL (ref 0.30–0.70)
Heparin Unfractionated: 0.32 IU/mL (ref 0.30–0.70)
Heparin Unfractionated: 0.39 IU/mL (ref 0.30–0.70)
Heparin Unfractionated: 0.42 IU/mL (ref 0.30–0.70)

## 2010-03-06 LAB — BLOOD GAS, ARTERIAL
Acid-Base Excess: 1 mmol/L (ref 0.0–2.0)
Bicarbonate: 24.7 mEq/L — ABNORMAL HIGH (ref 20.0–24.0)
Drawn by: 31996
FIO2: 0.21 %
O2 Saturation: 91.3 %
Patient temperature: 98.6
TCO2: 25.8 mmol/L (ref 0–100)
pCO2 arterial: 36.7 mmHg (ref 35.0–45.0)
pH, Arterial: 7.443 (ref 7.350–7.450)
pO2, Arterial: 62.3 mmHg — ABNORMAL LOW (ref 80.0–100.0)

## 2010-03-06 LAB — TSH: TSH: 1.31 u[IU]/mL (ref 0.350–4.500)

## 2010-03-06 LAB — CREATININE, SERUM
Creatinine, Ser: 1.13 mg/dL (ref 0.4–1.5)
Creatinine, Ser: 2.07 mg/dL — ABNORMAL HIGH (ref 0.4–1.5)
GFR calc Af Amer: >60 mL/min (ref >60)
GFR calc Af Amer: 38 mL/min — ABNORMAL LOW (ref >60)
GFR calc non Af Amer: >60 mL/min (ref >60)
GFR calc non Af Amer: 31 mL/min — ABNORMAL LOW (ref >60)

## 2010-03-06 LAB — URINALYSIS, ROUTINE W REFLEX MICROSCOPIC
Bilirubin Urine: NEGATIVE
Hgb urine dipstick: NEGATIVE
Ketones, ur: NEGATIVE mg/dL
Nitrite: NEGATIVE
Protein, ur: NEGATIVE mg/dL
Specific Gravity, Urine: 1.017 (ref 1.005–1.030)
Urine Glucose, Fasting: NEGATIVE mg/dL
Urobilinogen, UA: 1 mg/dL (ref 0.0–1.0)
pH: 5.5 (ref 5.0–8.0)

## 2010-03-06 LAB — PREPARE PLATELETS: Unit division: 0

## 2010-03-06 LAB — HEMOGLOBIN AND HEMATOCRIT, BLOOD
HCT: 22.1 % — ABNORMAL LOW (ref 39.0–52.0)
Hemoglobin: 7.6 g/dL — ABNORMAL LOW (ref 13.0–17.0)

## 2010-03-06 LAB — HEMOGLOBIN A1C
Hgb A1c MFr Bld: 6.4 % — ABNORMAL HIGH (ref <5.7)
Mean Plasma Glucose: 137 mg/dL — ABNORMAL HIGH (ref <117)

## 2010-03-06 LAB — PLATELET INHIBITION P2Y12
P2Y12 % Inhibition: 0 %
P2Y12 % Inhibition: 0 %
Platelet Function  P2Y12: 260 [PRU] (ref 194–418)
Platelet Function  P2Y12: 304 [PRU] (ref 194–418)
Platelet Function Baseline: 248 [PRU] (ref 194–418)
Platelet Function Baseline: 247 [PRU] (ref 194–418)

## 2010-03-06 LAB — TROPONIN I
Troponin I: 0.09 ng/mL — ABNORMAL HIGH (ref 0.00–0.06)
Troponin I: 2.2 ng/mL (ref 0.00–0.06)

## 2010-03-06 LAB — APTT
aPTT: 26 seconds (ref 24–37)
aPTT: 41 seconds — ABNORMAL HIGH (ref 24–37)

## 2010-03-06 LAB — MRSA PCR SCREENING: MRSA by PCR: NEGATIVE

## 2010-03-06 LAB — PLATELET COUNT: Platelets: 98 10*3/uL — ABNORMAL LOW (ref 150–400)

## 2010-03-06 LAB — GLYCOHEMOGLOBIN, TOTAL: Hemoglobin-A1c: 7.4 % (ref 5.4–7.4)

## 2010-03-06 LAB — LIPASE, BLOOD: Lipase: 37 U/L (ref 11–59)

## 2010-03-06 LAB — PHOSPHORUS: Phosphorus: 3.2 mg/dL (ref 2.3–4.6)

## 2010-03-08 LAB — GLUCOSE, CAPILLARY
Glucose-Capillary: 109 mg/dL — ABNORMAL HIGH (ref 70–99)
Glucose-Capillary: 146 mg/dL — ABNORMAL HIGH (ref 70–99)
Glucose-Capillary: 136 mg/dL — ABNORMAL HIGH (ref 70–99)
Glucose-Capillary: 147 mg/dL — ABNORMAL HIGH (ref 70–99)
Glucose-Capillary: 85 mg/dL (ref 70–99)
Glucose-Capillary: 169 mg/dL — ABNORMAL HIGH (ref 70–99)
Glucose-Capillary: 147 mg/dL — ABNORMAL HIGH (ref 70–99)
Glucose-Capillary: 218 mg/dL — ABNORMAL HIGH (ref 70–99)

## 2010-03-08 LAB — CBC
HCT: 26 % — ABNORMAL LOW (ref 39.0–52.0)
HCT: 25 % — ABNORMAL LOW (ref 39.0–52.0)
Hemoglobin: 8.7 g/dL — ABNORMAL LOW (ref 13.0–17.0)
Hemoglobin: 8.1 g/dL — ABNORMAL LOW (ref 13.0–17.0)
MCH: 31.2 pg (ref 26.0–34.0)
MCH: 30.2 pg (ref 26.0–34.0)
MCHC: 33.5 g/dL (ref 30.0–36.0)
MCHC: 32.4 g/dL (ref 30.0–36.0)
MCV: 93.2 fL (ref 78.0–100.0)
MCV: 93.3 fL (ref 78.0–100.0)
Platelets: 173 10*3/uL (ref 150–400)
Platelets: 229 10*3/uL (ref 150–400)
RBC: 2.79 MIL/uL — ABNORMAL LOW (ref 4.22–5.81)
RBC: 2.68 MIL/uL — ABNORMAL LOW (ref 4.22–5.81)
RDW: 13.2 % (ref 11.5–15.5)
RDW: 13.2 % (ref 11.5–15.5)
WBC: 10.1 10*3/uL (ref 4.0–10.5)
WBC: 9.6 10*3/uL (ref 4.0–10.5)

## 2010-03-08 LAB — BASIC METABOLIC PANEL
BUN: 33 mg/dL — ABNORMAL HIGH (ref 6–23)
BUN: 31 mg/dL — ABNORMAL HIGH (ref 6–23)
CO2: 25 mEq/L (ref 19–32)
CO2: 26 mEq/L (ref 19–32)
Calcium: 8.1 mg/dL — ABNORMAL LOW (ref 8.4–10.5)
Calcium: 8.4 mg/dL (ref 8.4–10.5)
Chloride: 103 mEq/L (ref 96–112)
Chloride: 106 mEq/L (ref 96–112)
Creatinine, Ser: 1.83 mg/dL — ABNORMAL HIGH (ref 0.4–1.5)
Creatinine, Ser: 1.7 mg/dL — ABNORMAL HIGH (ref 0.4–1.5)
GFR calc Af Amer: 44 mL/min — ABNORMAL LOW (ref >60)
GFR calc Af Amer: 48 mL/min — ABNORMAL LOW (ref >60)
GFR calc non Af Amer: 36 mL/min — ABNORMAL LOW (ref >60)
GFR calc non Af Amer: 39 mL/min — ABNORMAL LOW (ref >60)
Glucose, Bld: 75 mg/dL (ref 70–99)
Glucose, Bld: 104 mg/dL — ABNORMAL HIGH (ref 70–99)
Potassium: 3.8 mEq/L (ref 3.5–5.1)
Potassium: 3.9 mEq/L (ref 3.5–5.1)
Sodium: 135 mEq/L (ref 135–145)
Sodium: 141 mEq/L (ref 135–145)

## 2010-03-13 LAB — BASIC METABOLIC PANEL
BUN: 27 mg/dL — ABNORMAL HIGH (ref 6–23)
BUN: 21 mg/dL (ref 6–23)
CO2: 27 mEq/L (ref 19–32)
CO2: 29 mEq/L (ref 19–32)
Calcium: 8.5 mg/dL (ref 8.4–10.5)
Calcium: 8.8 mg/dL (ref 8.4–10.5)
Chloride: 108 mEq/L (ref 96–112)
Chloride: 103 mEq/L (ref 96–112)
Creatinine, Ser: 1.57 mg/dL — ABNORMAL HIGH (ref 0.4–1.5)
Creatinine, Ser: 1.6 mg/dL — ABNORMAL HIGH (ref 0.4–1.5)
GFR calc Af Amer: 52 mL/min — ABNORMAL LOW (ref >60)
GFR calc Af Amer: 51 mL/min — ABNORMAL LOW (ref >60)
GFR calc non Af Amer: 43 mL/min — ABNORMAL LOW (ref >60)
GFR calc non Af Amer: 42 mL/min — ABNORMAL LOW (ref >60)
Glucose, Bld: 86 mg/dL (ref 70–99)
Glucose, Bld: 131 mg/dL — ABNORMAL HIGH (ref 70–99)
Potassium: 3.8 mEq/L (ref 3.5–5.1)
Potassium: 4.5 mEq/L (ref 3.5–5.1)
Sodium: 144 mEq/L (ref 135–145)
Sodium: 143 mEq/L (ref 135–145)

## 2010-03-13 LAB — GLUCOSE, CAPILLARY
Glucose-Capillary: 88 mg/dL (ref 70–99)
Glucose-Capillary: 166 mg/dL — ABNORMAL HIGH (ref 70–99)
Glucose-Capillary: 137 mg/dL — ABNORMAL HIGH (ref 70–99)
Glucose-Capillary: 128 mg/dL — ABNORMAL HIGH (ref 70–99)
Glucose-Capillary: 104 mg/dL — ABNORMAL HIGH (ref 70–99)
Glucose-Capillary: 163 mg/dL — ABNORMAL HIGH (ref 70–99)
Glucose-Capillary: 107 mg/dL — ABNORMAL HIGH (ref 70–99)
Glucose-Capillary: 129 mg/dL — ABNORMAL HIGH (ref 70–99)
Glucose-Capillary: 55 mg/dL — ABNORMAL LOW (ref 70–99)
Glucose-Capillary: 110 mg/dL — ABNORMAL HIGH (ref 70–99)

## 2010-03-13 LAB — CBC
HCT: 24.5 % — ABNORMAL LOW (ref 39.0–52.0)
HCT: 26.8 % — ABNORMAL LOW (ref 39.0–52.0)
Hemoglobin: 8 g/dL — ABNORMAL LOW (ref 13.0–17.0)
Hemoglobin: 8.7 g/dL — ABNORMAL LOW (ref 13.0–17.0)
MCH: 30.2 pg (ref 26.0–34.0)
MCH: 30 pg (ref 26.0–34.0)
MCHC: 32.7 g/dL (ref 30.0–36.0)
MCHC: 32.5 g/dL (ref 30.0–36.0)
MCV: 92.5 fL (ref 78.0–100.0)
MCV: 92.4 fL (ref 78.0–100.0)
Platelets: 255 10*3/uL (ref 150–400)
Platelets: 398 10*3/uL (ref 150–400)
RBC: 2.65 MIL/uL — ABNORMAL LOW (ref 4.22–5.81)
RBC: 2.9 MIL/uL — ABNORMAL LOW (ref 4.22–5.81)
RDW: 13.1 % (ref 11.5–15.5)
RDW: 13.1 % (ref 11.5–15.5)
WBC: 6.8 10*3/uL (ref 4.0–10.5)
WBC: 8.5 10*3/uL (ref 4.0–10.5)

## 2010-03-13 NOTE — Consult Note (Addendum)
NAME:  Brian [name], Brian [name] NO.:  [id number]  MEDICAL RECORD NO.:  [id number]          PATIENT TYPE:  EMS  LOCATION:  [facility]                         FACILITY:  [facility]  PHYSICIAN:  Kerin Perna, M.D.  DATE OF BIRTH:  Jul 21, 1931  DATE OF CONSULTATION:  02/28/2010 DATE OF DISCHARGE:  02/27/2010                                CONSULTATION   REASON FOR CONSULTATION:  Severe multivessel coronary artery disease, non-STEMI, unstable angina.  HISTORY OF PRESENT ILLNESS:  I was asked to evaluate this very nice 77- year-old [place] American male, diabetic, ex-smoker for possible multivessel bypass grafting after being recently diagnosed with severe coronary artery disease.  The patient presented to the hospital with several days of substernal chest pain, which was similar to indigestion that lasted for several hours.  He eventually presented to the emergency room where he had some nonspecific EKG changes.  However, his point-of- care cardiac enzymes were positive and subsequently he was admitted and cardiac enzymes were repeated, and his peak CPK-MB was 133 with the troponin of 9.1.  The patient had no associated nausea, vomiting, diuresis, or syncope.  There is no prior history of angina or cardiac disease.  The patient was given a 600 mg Plavix load yesterday and was scheduled for cardiac catheterization today after his creatinine decreased from 1.5-1.3.  Cardiac cath demonstrated occlusion of a large circumflex marginal, 95% stenosis of the proximal LAD, and 80% stenosis of a large dominant right coronary.  LVEF was 50% and LVEDP was 26 mmHg. He was cathed via the right radial artery.  Due to his coronary anatomy, a surgical consultation was requested.  PAST MEDICAL HISTORY: 1. Type 2 diabetes mellitus. 2. Gout. 3. Hypertension. 4. Mild-to-moderate renal insufficiency.  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  He is widowed.  He does  not smoke, but he stopped smoking 11 years ago.  He does not drink alcohol.  HOME MEDICATIONS: 1. Allopurinol 150 mg daily. 2. Aspirin 81 mg daily. 3. Pravastatin 40 mg daily. 4. Enalapril 5 mg daily. 5. Vitamin D. 6. Ambien 7.5 mg daily. 7. Claritin 10 mg daily.  ALLERGIES:  DEMEROL and ACCUPRIL.  FAMILY HISTORY:  Negative for coronary artery disease.  Positive for diabetes.  REVIEW OF SYSTEMS:  CONSTITUTIONAL REVIEW:  Negative for fever or weight loss.  ENT REVIEW:  Negative for change in vision.  He has had bilateral cataract surgery.  No difficulty swallowing.  THORACIC REVIEW:  Negative for history of thoracic trauma, abnormal chest x-ray, recent episode of bronchitis, or prior history of hemoptysis.  CARDIAC REVIEW:  Positive for coronary artery disease and negative for history of valve disease, arrhythmia, or murmur.  A 2-D echo is pending.  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  He has also had an inguinal hernia repair.  UROLOGIC REVIEW:  Negative for BPH, hematuria, or UTI.  ENDOCRINE REVIEW:  Positive for diabetes.  Negative for thyroid disease.  VASCULAR REVIEW:  Positive for history of left leg varicose veins, negative for DVT, claudication, TIA.  NEUROLOGIC REVIEW: Negative for stroke or seizure.  HEMATOLOGIC REVIEW:  Negative for bleeding disorder.  His platelet function assay with P2Y12 following his Plavix was with minimal inhibition.  A repeat is pending.  PHYSICAL EXAMINATION:  VITAL SIGNS:  The patient is 5 feet 11 inches, weighs 82 kg.  Blood pressure 115/80, heart rate 60 sinus. GENERAL APPEARANCE:  A pleasant African American male, in his CC room following cardiac cath in no acute distress. HEENT:  Normocephalic.  Pupils are equal.  Pharynx is clear.  Dentition is adequate. NECK:  Without JVD, mass, or carotid bruit. LYMPHATICS:  No palpable supraclavicular or cervical adenopathy. LUNGS:  Breath sounds are  clear and there is no thoracic deformity. CARDIAC:  Regular rhythm without S3 gallop or murmur. ABDOMEN:  Soft and nontender without pulsatile mass. EXTREMITIES:  No clubbing, cyanosis, or edema.  Peripheral pulses are 2+ in both radial and ulnar vessels, 2+ in the femoral nonpalpable in the pedal region.  He has severe varicosities of the left leg.  No varicose veins in the right leg. NEUROLOGIC:  Alert and oriented without focal motor deficit.  LABORATORY DATA:  Duplex ultrasound of the abdomen shows a 3.8 cm abdominal aortic aneurysm.  His creatinine was 1.5 on admission, now down to 1.3.  Hemoglobin A1c is pending.  His LFTs are normal.  His peak CPK-MB was 133 ng/mL.  IMPRESSION AND PLAN:  The patient would benefit from multivessel bypass grafting.  We will assess his right leg for adequacy of saphenous vein conduit.  We will review his 2-D echocardiogram.  We will repeat the P2Y12 platelet assay in view of the 600 mg Plavix load yesterday.  He will be scheduled for surgical revascularization later this week.  Thank you for consultation.     Kerin Perna, M.D.     PV/MEDQ  D:  02/28/2010  T:  03/01/2010  Job:  [id number]  cc:   Verne Carrow, MD Titus Dubin. Alwyn Ren, MD,FACP,FCCP  Electronically Signed by Kerin Perna M.D. on 03/13/2010 08:18:08 AM

## 2010-03-13 NOTE — Op Note (Signed)
NAME:  Brian [name], Brian [name] NO.:  [id number]  MEDICAL RECORD NO.:  [id number]          PATIENT TYPE:  INP  LOCATION:  2315                         FACILITY:  [facility]  PHYSICIAN:  Kerin Perna, M.D.  DATE OF BIRTH:  1931/04/03  DATE OF PROCEDURE:  03/03/2010 DATE OF DISCHARGE:                              OPERATIVE REPORT   OPERATION: 1. Coronary artery bypass grafting x3 (left internal mammary artery to     LAD, saphenous vein graft to OM1, saphenous vein graft to posterior     descending). 2. Endoscopic harvest of right leg saphenous vein.  SURGEON:  Kerin Perna, MD.  ASSISTANT:  Sheliah Plane, MD and Stephanie Acre Dasovich, PA-C.  PREOPERATIVE DIAGNOSIS:  Severe three-vessel coronary artery disease, non-ST elevation myocardial infarction, unstable angina.  POSTOPERATIVE DIAGNOSIS:  Severe three-vessel coronary artery disease, non-ST elevation myocardial infarction, unstable angina.  ANESTHESIA:  General.  INDICATIONS:  The patient is a 77 year old African American male who presented with chest pain and positive cardiac enzymes.  Cardiac catheterization by Dr. Clifton James demonstrated occlusion of the circumflex, 95% stenosis of the LAD, and 80% stenosis of the distal right.  EF was fairly well-preserved, but he had left ventricular hypertrophy and hypertension.  There is a small 3.5-cm abdominal aortic aneurysm noted.  His cardiac enzymes peaked at 140 ng/mL and he was given a Plavix load of 600 mg.  After 72 hours of reperfusion and allowing a Plavix washout, he was scheduled for multivessel surgical revascularization.  Prior to surgery, I reviewed the results of cardiac cath with the patient and family and discussed indications and expected benefits of multivessel coronary artery bypass grafting for treatment of his severe coronary artery disease.  I discussed with him the details of surgery including the location of the surgical incisions, use of  general anesthesia and cardiopulmonary bypass, and the expected postoperative hospital recovery.  I discussed with the patient the risks to him of coronary bypass surgery including risks of MI, stroke, bleeding, blood transfusion requirement, infection, and death.  After reviewing all these issues, he demonstrated his understanding and agreed to proceed with the surgery under what I felt was an informed consent.  OPERATIVE FINDINGS: 1. Severe pleural adhesions in the left chest number with COPD -     emphysema. 2. Small caliber right leg saphenous vein measuring 2.5 mm in     diameter. 3. Severe varicosities of the left leg with no available conduit in     the left leg. 4. Left ventricular hypertrophy. 5. Good-quality mammary artery and some diffuse LAD disease.  PROCEDURE:  The patient was brought to the operating room and placed supine on the operating room table.  General anesthesia was induced.  A transesophageal echo was placed by the anesthesiologist.  The patient was prepped and draped as a sterile field.  A sternal incision was made as the saphenous vein was harvested endoscopically from the right leg. The left internal mammary artery was harvested as a pedicle graft from its origin at the subclavian vessels.  It was a good vessel with excellent flow.  The sternal retractor was placed.  When the vein was harvested, heparin was administered and the ACT was documented as being therapeutic.  The pericardium was opened and suspended.  Pursestrings were placed in the ascending aorta and right atrium and the patient was cannulated and placed on cardiopulmonary bypass.  The coronaries were identified for grafting - and cardioplegia catheters were placed for both antegrade and retrograde cold blood cardioplegia.  The patient was cooled to 32 degrees and the aortic crossclamp was applied.  800 mL of cold blood cardioplegia was delivered in split doses between the antegrade aortic and  retrograde coronary sinus catheters.  There is good cardioplegic arrest and septal temperature dropped less than 12 degrees. Cardioplegia was delivered every 20 minutes or less while the crossclamp was in place.  The distal coronary anastomoses were then performed.  The first distal anastomosis was to the posterior descending branch of the right coronary.  There is a proximal 80% stenosis.  Reverse saphenous vein was sewn end-to-side to this 1.5-mm vessel with running 7-0 Prolene and there is good flow through the graft.  The second distal anastomosis was the obtuse marginal branch of the left circumflex.  This vessel was totally occluded proximally.  As a 1.7-mm vessel, reverse saphenous vein was sewn end-to-side with running 7-0 Prolene with good flow through the graft.  Cardioplegia was redosed.  The third distal anastomosis was to the distal LAD.  It was placed beyond the plaque in the distal third of the vessel.  It was a proximal 95% high-grade stenosis plaque.  The left internal mammary arteries brought through an opening in the left lateral prior lateral pericardium was brought down on the LAD and sewn end-to- side with running 8-0 Prolene.  There is good flow through the anastomosis after briefly releasing the pedicle bulldog on the mammary artery.  The bulldog was reapplied and the pedicle was secured at the epicardium.  Cardioplegia was redosed.  While the crossclamp was still in place, two proximal vein anastomoses were performed on the ascending aorta using a 4.0-mm punch.  Prior to tying down the final proximal anastomosis, air was vented from the coronaries with a dose of retrograde warm blood cardioplegia.  The final proximal anastomosis was tied and the crossclamp was removed.  The heart resumed a spontaneous rhythm.  The grafts were opened and each had good flow.  Hemostasis was documented with proximal and distal anastomoses.  Temporary pacing wires were applied.  The  patient was rewarmed to 37 degrees.  The lungs were expanded and ventilator was resumed.  The patient was weaned off bypass without requiring inotropes. Blood pressure and cardiac output were stable.  Global LV function was preserved by 2-D echo.  Protamine was administered without adverse reaction.  The cannula was removed.  The mediastinum was irrigated with warm saline.  The pericardium was closed superiorly.  Two mediastinal and a left pleural chest tube were placed and brought out through separate incisions.  The sternum was closed with interrupted steel wire.  The pectoralis fascia was closed with a running #1 Vicryl.  Subcutaneous and skin layers were closed with running Vicryl and sterile dressings were applied.  Total bypass time was 108 minutes.     Kerin Perna, M.D.     PV/MEDQ  D:  03/03/2010  T:  03/04/2010  Job:  [id number]  cc:   Verne Carrow, MD Dr. Alwyn Ren  Electronically Signed by Kerin Perna M.D. on 03/13/2010 08:18:18 AM

## 2010-03-14 LAB — BASIC METABOLIC PANEL
BUN: 21 mg/dL (ref 6–23)
CO2: 30 mEq/L (ref 19–32)
Calcium: 8.6 mg/dL (ref 8.4–10.5)
Chloride: 98 mEq/L (ref 96–112)
Creatinine, Ser: 1.68 mg/dL — ABNORMAL HIGH (ref 0.4–1.5)
GFR calc Af Amer: 48 mL/min — ABNORMAL LOW (ref >60)
GFR calc non Af Amer: 40 mL/min — ABNORMAL LOW (ref >60)
Glucose, Bld: 85 mg/dL (ref 70–99)
Potassium: 3.4 mEq/L — ABNORMAL LOW (ref 3.5–5.1)
Sodium: 141 mEq/L (ref 135–145)

## 2010-03-18 NOTE — H&P (Signed)
NAME:  Brian, [name] NO.:  [id number]  MEDICAL RECORD NO.:  [id number]          PATIENT TYPE:  EMS  LOCATION:  [facility]JO                         FACILITY:  [facility]  PHYSICIAN:  Michiel Cowboy, MDDATE OF BIRTH:  December 14, 1931  DATE OF ADMISSION:  02/27/2010 DATE OF DISCHARGE:                             HISTORY & PHYSICAL   PRIMARY CARE PROVIDER:  Titus Dubin. Alwyn Ren, MD, FACP, FCCP  CHIEF COMPLAINT:  Chest pain.  The patient is a 77 year old male with past medical history consistent of hypertension.  The patient today woke up at night with chest pain which was dull ache.  He thought that was maybe indigestion.  He tried taking some Tums without much of relief.  The pain lasted for 3 hours. He eventually presented to the emergency department.  Pain was non- radiational, did not cause any shortness of breath.  No diaphoresis.  No nausea, no vomiting.  At this point, the patient is almost completely chest pain free, is greatly improved.  He did receive a nitro patch while he was in the emergency department and currently has a mild headache.  REVIEW OF SYSTEMS:  Otherwise negative.  No diarrhea.  No constipation. Review of systems is otherwise negative.  PAST MEDICAL HISTORY:  Significant for: 1. Gout. 2. Allergies. 3. Diabetes.  His diabetes is very mild.  SOCIAL HISTORY:  The patient does not smoke, does not drink or abuse drugs.  FAMILY HISTORY:  No coronary artery disease in the recent family.  ALLERGIES:  DEMEROL.  MEDICATIONS: 1. Tums as needed. 2. Allopurinol 150 mg a daily. 3. Aspirin 81 mg a daily. 4. Pravastatin 40 mg a daily. 5. Enalapril 5 mg daily. 6. Triamcinolone cream. 7. Vitamin D. 8. Claritin 10 mg a daily. 9. Ambien 7.5 mg a daily.  PHYSICAL EXAMINATION:  VITAL SIGNS:  Temperature 97.8, blood pressure 162/74, pulse 54, aspirations 24, satting 97% on room air. GENERAL:  The patient appears to be in no acute distress. HEENT:   Head, nontraumatic.  Moist mucous membranes. LUNGS:  Clear to auscultation bilaterally. HEART:  Regular rhythm.  No murmurs appreciated. ABDOMEN:  Soft, nontender, nondistended. EXTREMITIES:  Lower extremities without clubbing, cyanosis, edema. NEUROLOGIC:  Grossly intact. SKIN:  Clean, dry, and intact.  LABORATORY DATA:  White blood cell count 6.8, hemoglobin 13.9.  Sodium 144, potassium 4.2; creatinine 1.5, unknown baseline.  Lipase 37.  LFTs within normal limits.  Cardiac enzymes, iSTATs negative.  EKG showing possible early repol.  They have somewhat scooped out, ST-segment in leads V2, V5, V6, lead 2 and lead 3.  I do not have an old EKG compare to this.  ASSESSMENT AND PLAN: 1. This is a 78 year old gentleman diabetic with hypertension with     chest pain as well as somewhat atypical, but given somewhat     abnormal EKG, we will admit and further evaluate.  We will cycle     cardiac enzymes, repeat serial EKG, repeat another one on arrival     to the floor.  We will further risk stratify with fasting lipid    panel and hemoglobin A1c.  If  cardiac markers turn out positive, we     will call Cardiology.  The patient never had a stress test and may     probably benefit from one in the near future. 2. Diabetes.  We will put him on sliding scale. 3. Hyperlipidemia.  Continue pravastatin.  He actually denies history     of hypertension. 4. Prophylaxis.  Protonix and Lovenox.     Michiel Cowboy, MD     AVD/MEDQ  D:  02/27/2010  T:  02/27/2010  Job:  [id number]  cc:   Titus Dubin. Alwyn Ren, MD,FACP,FCCP  Electronically Signed by Therisa Doyne MD on 03/18/2010 06:23:13 AM

## 2010-03-20 NOTE — Discharge Summary (Signed)
NAME:  Brian [name], Brian [name] NO.:  [id number]  MEDICAL RECORD NO.:  [id number]          PATIENT TYPE:  INP  LOCATION:  2017                         FACILITY:  [facility]  PHYSICIAN:  Kerin Perna, M.D.  DATE OF BIRTH:  15-Oct-1931  DATE OF ADMISSION:  02/27/2010 DATE OF DISCHARGE:                              DISCHARGE SUMMARY   PRIMARY ADMITTING DIAGNOSIS:  Chest pain.  ADDITIONAL/DISCHARGE DIAGNOSES: 1. Severe multivessel coronary artery disease. 2. Non-ST elevation myocardial infarction. 3. Type 2 diabetes mellitus. 4. Gout. 5. Hypertension. 6. Postoperative acute on chronic renal insufficiency. 7. Remote history of tobacco abuse. 8. Postoperative acute blood loss anemia.  PROCEDURES PERFORMED: 1. Cardiac catheterization. 2. Coronary artery bypass grafting x3 (left internal mammary artery to     the LAD, saphenous vein graft to the first obtuse marginal,     saphenous vein graft to the posterior descending). 3. Endoscopic vein harvest, right leg.  HISTORY:  The patient is a 77 year old male who presented to the Emergency Department at [facility] complaining of an acute onset of chest discomfort.  The pain awakened him from sleep and was dull and aching.  He thought it was maybe indigestion and took some Tums without relief.  He did not have any associated shortness of breath, diaphoresis, nausea or vomiting.  He was seen by Cardiology and was admitted for further evaluation and treatment.  HOSPITAL COURSE:  Initially, the patient was noted to have nonspecific ST changes.  However, his point-of-care cardiac enzymes ultimately were positive and his peak CPK-MB was 133 with a troponin of 9.1.  He remained stable and pain-free following admission.  He was taken for cardiac catheterization on February 28, 2010 by Dr. Clifton James and was noted to have severe three-vessel coronary artery disease with large circumflex marginal, which was occluded and 95%  stenosis of the proximal LAD and 80% stenosis of large dominant right coronary artery, left ventricular ejection fraction was 50% and LVEDP was 26 mmHg.  Because of his multivessel disease and his coronary anatomy, he was felt to be a poor candidate for percutaneous intervention.  A cardiac surgery consultation was requested and the patient was seen by Dr. Kathlee Nations Trigt.  After his films were reviewed, Dr. Donata Clay agreed with need for CABG.  It was felt that since the patient had been given Plavix on admission, and had had a slight increase in his creatinine postcatheterization, that surgery should be delayed for 2-3 days to allow his renal function to return to baseline.  In the interim, he underwent a preoperative workup which included carotid Doppler studies, which showed no ICA stenosis.  He also underwent lower extremity ABIs, which were within normal limits bilaterally.  He remained stable and pain free.  All risks, benefits and alternatives of surgery were explained to the patient.  He agreed to proceed.  He was taken to the operating room on March 03, 2010, and underwent CABG x3 as described above.  Please see previously dictated operative report for complete details of surgery.  He tolerated the procedure well and was transferred to the SICU in stable condition.  He was able to be extubated shortly after surgery.  He was hemodynamically stable and doing well on postop day #1. In that time, his chest tubes and hemodynamic monitoring lines were removed and he was able to be transferred to the Step-Down Unit.  His postoperative course been notable for some bradycardia, initially requiring atrial pacing.  His pacer was slowly weaned and able to be discontinued and he has been started on a low-dose beta-blocker, which he appears to be tolerating well.  He has had no further episodes of bradycardia and presently he is in sinus rhythm with heart rates in the 70s-80s.  He has also  continued to have mild elevation in his creatinine postoperatively, which peaked on postop day #2 at 2.0.  His creatinine has continued to trend downward and at the present time is 1.5.  He has been mildly volume overloaded and was started on Lasix, which he has responded well.  He has had a mild blood loss anemia, which has not required a transfusion and which has been treated with supplemental iron.  He has remained afebrile and all vital signs have been stable. He has had some mild respiratory insufficiency, which has been treated with aggressive pulmonary toilet measures including incentive spirometry and a flutter valve.  Currently, his supplemental oxygen is being weaned, although he remains on 1-2 liters.  He is ambulating without difficulty with cardiac rehab phase 1 and is progressing well.  He is tolerating a regular diet and having normal bowel and bladder function. His most recent labs on postop day #6 show hemoglobin of 8.0, hematocrit 24.5, platelets 255, white count 6.8,  Sodium 143, potassium 4.5, BUN 21, creatinine 1.60.  His most recent chest x-ray shows bibasilar atelectasis and small bilateral pleural effusions.  We will repeat CBC on the morning of March 10, 2010.  We will also monitor his oxygen saturations over the next 24 hours in the event that he may need home oxygen briefly.  Hopefully has no acute changes occurred and he continues to remain stable, he will be ready for discharge home on March 10, 2010.  DISCHARGE MEDICATIONS: 1. Aggrenox 25/200 mg b.i.d. 2. Folic acid 1 mg daily. 3. Mucinex 600 mg b.i.d. 4. Nu-Iron 150 mg daily. 5. Metformin 500 mg b.i.d. 6. Crestor 40 mg nightly. 7. Ultram 50-100 mg q.4-6 h. p.r.n. for pain. 8. Enteric-coated aspirin 81 mg daily. 9. Allopurinol 100 mg daily. 10.Claritin 10 mg daily. 11.Temazepam 7.5 mg nightly. 12.Triamcinolone topical cream b.i.d. p.r.n. 13.Vitamin D 1000 units nightly.    Of note, the patient  has not been     started on ACE inhibitor postoperatively secondary to his elevated     renal function and this will be readdressed at his outpatient     cardiology appointment.  DISCHARGE INSTRUCTIONS:  He is asked to refrain from driving, heavy lifting, or strenuous activity.  He may continue ambulating daily and using his incentive spirometer.  He may shower daily and clean his incisions with soap and water.  He will continue low-fat, low-sodium, and carbohydrate-modified diet.  DISCHARGE FOLLOWUP:  He will need to make an appointment to follow up with Dr. Clifton James in 2 weeks.  He will then see Dr. Donata Clay in 3 weeks with a chest x-ray.  In the interim if he experiences any problems or has questions, he is asked to contact our office immediately.     Coral Ceo, P.A.   ______________________________ Kerin Perna, M.D.    GC/MEDQ  D:  03/09/2010  T:  03/10/2010  Job:  [id number]  cc:   [facility] Office Verne Carrow, MD Titus Dubin. Alwyn Ren, MD,FACP,FCCP  Electronically Signed by Coral Ceo P.A. on 03/13/2010 04:09:54 PM Electronically Signed by Kerin Perna M.D. on 03/20/2010 05:18:01 PM

## 2010-03-21 NOTE — Letter (Signed)
Summary: [facility]   Imported By: Lanelle Bal 10/04/2009 08:25:08  _____________________________________________________________________  External Attachment:    Type:   Image     Comment:   External Document

## 2010-03-21 NOTE — Letter (Signed)
Summary: [facility]   Imported By: Lanelle Bal 12/06/2009 16:11:24  _____________________________________________________________________  External Attachment:    Type:   Image     Comment:   External Document

## 2010-03-21 NOTE — Assessment & Plan Note (Signed)
Summary: FOLLOW UP/RH......Marland Kitchen   Vital Signs:  Patient profile:   77 year old male Weight:      178.2 pounds Pulse rate:   64 / minute Resp:     16 per minute BP sitting:   120 / 64  (left arm) Cuff size:   large  Vitals Entered By: Shonna Chock (July 20, 2009 9:07 AM) CC: 1.) 6 Month follow-up  2.) Refill Pravachol and Allopurinol Comments REVIEWED MED LIST, PATIENT AGREED DOSE AND INSTRUCTION CORRECT    CC:  1.) 6 Month follow-up  2.) Refill Pravachol and Allopurinol.  History of Present Illness: FBS 82-110; 2 hrs post meal < 160. No hypoglycemia; weight down 7-8#.No diet; CVE as walking 3-4X/week for 25-30 min w/o symptoms. A1c in NON Diabetes range of 6%.A1c was 5.8% , creat 1.2, HDL 52 & LDL 77 in 03/2009 @ [facility](records reviewed). "Small Aneurysm , < 3cm will be rechecked 04/2010"Review of records back to 2007 reveals no A1c > 6%. He believes A1c high was >7% in 2000.  Allergies: 1)  ! Demerol 2)  ! Accupril 3)  ! * Artifical Sweetners  Review of Systems General:  Denies fatigue. Eyes:  Denies blurring, double vision, and vision loss-both eyes; No retinopathy 01/2009. CV:  Denies chest pain or discomfort, leg cramps with exertion, lightheadness, near fainting, and shortness of breath with exertion. GI:  Denies abdominal pain, bloody stools, dark tarry stools, and indigestion. MS:  Denies joint pain, joint redness, and joint swelling; Last gout attack 2008. Derm:  Denies poor wound healing. Neuro:  Denies numbness and tingling; Occasional sharp pain under toes "momentarily". Psych:  Denies anxiety and depression. Endo:  Denies excessive hunger, excessive thirst, and excessive urination.  Physical Exam  General:  Appears younger than age,well-nourished; alert,appropriate and cooperative throughout examination Neck:  No deformities, masses, or tenderness noted. Lungs:  Normal respiratory effort, chest expands symmetrically. Lungs are clear to auscultation, no crackles or  wheezes. Heart:  regular rhythm, no murmur, no gallop, no rub, no JVD, no HJR, and bradycardia.   Abdomen:  Bowel sounds positive,abdomen soft and non-tender without masses, organomegaly or hernias noted. No AAA palpable; no bruits Pulses:  R and L carotid,radial,dorsalis pedis and posterior tibial pulses are full and equal bilaterally Extremities:  No clubbing, cyanosis, edema, or deformity noted . Good nail health Neurologic:  alert & oriented X3, sensation intact to light touch over feet, and DTRs symmetrical and normal.   Skin:  Intact without suspicious lesions or rashes Cervical Nodes:  No lymphadenopathy noted Axillary Nodes:  No palpable lymphadenopathy Psych:  memory intact for recent and remote, normally interactive, and subdued.     Impression & Recommendations:  Problem # 1:  DIABETES-TYPE 2 (ICD-250.00) A1c in NON DM range consistently The following medications were removed from the medication list:    Glucophage 500 Mg Tabs (Metformin hcl) .Marland Kitchen... 1 once daily with largest meal His updated medication list for this problem includes:    Adult Aspirin Low Strength 81 Mg Tbdp (Aspirin)    Enalapril Maleate 5 Mg Tabs (Enalapril maleate) .Marland Kitchen... 1 by mouth once daily  Problem # 2:  HYPERLIPIDEMIA NEC/NOS (ICD-272.4)  Lipids @ goal His updated medication list for this problem includes:    Pravachol 40 Mg Tabs (Pravastatin sodium) .Marland Kitchen... 1 qhs  Orders: Prescription Created Electronically [phone])  Problem # 3:  GOUT (ICD-274.9)  Inactive  Orders: Prescription Created Electronically [phone])  Problem # 4:  AAA (ICD-441.4) as per [facility]  Complete Medication List: 1)  Adult Aspirin Low Strength 81 Mg Tbdp (Aspirin) 2)  Triamcinolone Acetonide 0.1 % Crea (Triamcinolone acetonide) .... Prn 3)  Enalapril Maleate 5 Mg Tabs (Enalapril maleate) .Marland Kitchen.. 1 by mouth once daily 4)  Allopurinol 100 Mg Tabs (allopurinol)  .Marland Kitchen.. 1 once daily 5)  Pravachol 40 Mg Tabs (Pravastatin sodium) .Marland Kitchen.. 1  qhs 6)  Claritin 10 Mg Tabs (Loratadine) .... As needed 7)  Accu-chek Plus Test Strip  .... Test once daily 8)  Accu-chek Plus Lancet  .... Test once daily 9)  Temazepam 7.5 Mg Caps (Temazepam) .Marland Kitchen.. 1 by mouth at bedtime, changed by the [facility]  Patient Instructions: 1)  Please schedule a follow-up appointment @ [facility] in 6 months. 2)  Check your blood sugars regularly. If your readings are usually above :150 or below 90 you should contact our office. 3)  See your eye doctor yearly to check for diabetic eye damage. 4)  Check your feet each night for sore areas, calluses or signs of infection. 5)  Check your Blood Pressure regularly. If it is above:135/85 ON AVERAGE you should make an appointment. 6)  Hepatic Panel ;uric acid; 7)  Lipid Panel ; 8)  HbgA1C; 9)  BMP; 10)  Urine Microalbumin  @ [facility] Prescriptions: PRAVACHOL 40 MG  TABS (PRAVASTATIN SODIUM) 1 qhs  #90 x 3   Entered and Authorized by:   Marga Melnick MD   Signed by:   Marga Melnick MD on 07/20/2009   Method used:   Printed then faxed to ...       [facility] Environmental education officer)       [address] [address]       [place], [place]  [address]       Ph: [phone]       Fax: [phone]   RxID:   [id number] ALLOPURINOL 100 MG  TABS (ALLOPURINOL) 1 once daily  #90 x 3   Entered and Authorized by:   Marga Melnick MD   Signed by:   Marga Melnick MD on 07/20/2009   Method used:   Printed then faxed to ...       [facility] [employer] education officer)       [address] [address]       [place], [place]  [address]       Ph: [phone]       Fax: [phone]   RxID:   [id number]

## 2010-03-21 NOTE — Letter (Signed)
Summary: Letter to Patient with [place] of Aorta Results/[facility]  Letter to Patient with [place] of A[place] Results/[facility]   Imported By: Lanelle Bal 06/20/2009 12:20:14  _____________________________________________________________________  External Attachment:    Type:   Image     Comment:   External Document

## 2010-03-21 NOTE — Progress Notes (Signed)
Summary: [facility]WORK [facility]AP[facility] TO REVIEW  Phone Note Call from Patient Call back at Home Phone [phone]   Caller: Patient Summary of Call: PATIENT DROPPED OFF LABWORK PAPERS FROM [facility] FOR DR Vardaan Depascale TO REVIEW  ASKED HIM TO CALL [phone] IF DR Nissi Doffing NEEDED TO TALK TO HIM ABOUT THESE TESTS  WILL TAKE TO [name]'S DESK IN [employer] Initial call taken by: Jerolyn Shin,  May 12, 2009 12:14 PM  Follow-up for Phone Call        labs placed on ledge for dr Cendy Oconnor to review...........Marland KitchenFelecia Deloach CMA  May 12, 2009 12:32 PM   Additional Follow-up for Phone Call Additional follow up Details #1::        GREAT reports! DECREASE Glucophage from 500 mg 1&1/2 two times a day to 1 two times a day with 2 largest meals. Check A1c & urine microalbumin  in 3 months (790.29) Additional Follow-up by: Marga Melnick MD,  May 13, 2009 7:50 AM    New/Updated Medications: GLUCOPHAGE 500 MG  TABS (METFORMIN HCL) 1 two times a day

## 2010-03-21 NOTE — Miscellaneous (Signed)
Summary: Flu vacc documentation  Clinical Lists Changes  Orders: Added new Service order of Flu Vaccine 37yrs + MEDICARE PATIENTS (G2542) - Signed Added new Service order of Administration Flu vaccine - MCR (H0623) - Signed Observations: Added new observation of FLU VAX VIS: 09/13/2009 version (11/24/2009 9:31) Added new observation of FLU VAXLOT: [id number] (11/24/2009 9:31) Added new observation of FLU VAXMFR: Glaxosmithkline (11/24/2009 9:31) Added new observation of FLU VAX EXP: 08/19/2010 (11/24/2009 9:31) Added new observation of FLU VAX DSE: 0.74ml (11/24/2009 9:31) Added new observation of FLU VAX: Fluvax 3+ (11/24/2009 9:31)        Flu Vaccine Consent Questions     Do you have a history of severe allergic reactions to this vaccine? no    Any prior history of allergic reactions to egg and/or gelatin? no    Do you have a sensitivity to the preservative Thimersol? no    Do you have a past history of Guillan-Barre Syndrome? no    Do you currently have an acute febrile illness? no    Have you ever had a severe reaction to latex? no    Vaccine information given and explained to patient? yes    Are you currently pregnant? no    Lot Number:[id number]   Exp Date:08/19/2010   Site Given  Left Deltoid IMedflu

## 2010-03-28 ENCOUNTER — Other Ambulatory Visit: Payer: Self-pay | Admitting: Cardiothoracic Surgery

## 2010-03-28 DIAGNOSIS — I2510 Atherosclerotic heart disease of native coronary artery without angina pectoris: Secondary | ICD-10-CM

## 2010-03-29 ENCOUNTER — Ambulatory Visit
Admission: RE | Admit: 2010-03-29 | Discharge: 2010-03-29 | Disposition: A | Discharge status: Home / Self Care | Payer: MEDICARE | Source: Ambulatory Visit | Attending: Cardiothoracic Surgery | Admitting: Cardiothoracic Surgery

## 2010-03-29 ENCOUNTER — Ambulatory Visit (INDEPENDENT_AMBULATORY_CARE_PROVIDER_SITE_OTHER): Payer: MEDICARE | Admitting: Cardiothoracic Surgery

## 2010-03-29 ENCOUNTER — Encounter: Payer: Self-pay | Admitting: Internal Medicine

## 2010-03-29 ENCOUNTER — Encounter: Payer: Self-pay | Admitting: Cardiovascular Disease

## 2010-03-29 DIAGNOSIS — I2510 Atherosclerotic heart disease of native coronary artery without angina pectoris: Secondary | ICD-10-CM

## 2010-03-30 ENCOUNTER — Encounter: Payer: Self-pay | Admitting: Cardiovascular Disease

## 2010-03-31 ENCOUNTER — Other Ambulatory Visit: Payer: Self-pay

## 2010-03-31 ENCOUNTER — Encounter: Payer: Self-pay | Admitting: Cardiovascular Disease

## 2010-03-31 ENCOUNTER — Encounter (INDEPENDENT_AMBULATORY_CARE_PROVIDER_SITE_OTHER): Payer: MEDICARE | Admitting: Cardiovascular Disease

## 2010-03-31 DIAGNOSIS — I2510 Atherosclerotic heart disease of native coronary artery without angina pectoris: Secondary | ICD-10-CM

## 2010-03-31 DIAGNOSIS — I739 Peripheral vascular disease, unspecified: Secondary | ICD-10-CM

## 2010-03-31 DIAGNOSIS — Z951 Presence of aortocoronary bypass graft: Secondary | ICD-10-CM

## 2010-03-31 NOTE — Assessment & Plan Note (Signed)
OFFICE VISIT  JEVONTE, CLANTON A DOB:  [birth year]                                        March 29, 2010 CHART #:  [id number]  CURRENT PROBLEMS: 1. Status post coronary artery bypass grafting x3 for non-ST segment     elevation myocardial infarction and unstable angina, March 03, 2010. 2. Type 2 diabetes mellitus. 3. Hypertension.  PRESENT ILLNESS:  The patient is a very nice 77 year old gentleman who underwent multivessel bypass grafting 3 weeks ago when he presented with unstable angina and positive cardiac enzymes.  Cardiac cath by Dr. Clifton James demonstrated a 95% stenosis of the LAD, total occlusion of the circumflex, and an 80% stenosis of the right coronary.  LVEF was fairly well preserved.  After a Plavix washout, he underwent left IMA grafting to his LAD and vein grafts to the OM1 and posterior descending. Postoperatively, he did well and was discharged home on the 4th postoperative day on home oxygen due to a persistent low oxygen saturation, metformin, simvastatin, Ultram, Aggrenox, metoprolol 12.5 b.i.d., allopurinol, and iron tablets.  Since returning home, he has had no recurrent angina and surgical incisions are healing well.  He is walking daily and he has had no edema or weight gain.  In fact, he has lost weight and his appetite is poor.  He has had some nausea.  PHYSICAL EXAMINATION:  Vital Signs:  His blood pressure is 118/70, pulse 80, respirations 20, and saturation on room air is now on 100%.  Lungs: His breath sounds are clear and equal.  The sternum is stable and the incision is well healed.  Cardiac:  Rhythm is regular.  There is no gallop or murmur.  Extremities:  His leg incisions are well healed and he has no pedal edema.  DIAGNOSTIC TESTS:  A PA and lateral chest x-ray reveals changes with COPD, but no pleural effusion, no edema, and the sternal wires are all intact.  IMPRESSION AND PLAN:  The patient has done well  3-1/2 weeks after surgery and is ready to advance his activity level to include driving and light lifting up to 15 pounds.  He is encouraged to start cardiac rehab at the hospital.  Because of his nausea, I told him he could stop his iron and continue his other medications including the pravastatin 40 mg a day, Lopressor 12.5 mg a day, Aggrenox 1 p.o. b.i.d., and enteric- coated aspirin 81 mg a day.  We will discontinue his home oxygen.  The patient knows not to lift more than 15-20 pounds until May 21, 2010. Otherwise, he will return to the care of his physicians and return here as needed.  Kerin Perna, M.D. Electronically Signed  PV/MEDQ  D:  03/29/2010  T:  03/30/2010  Job:  [id number]  cc:   Titus Dubin. Alwyn Ren, MD,FACP,FCCP Verne Carrow, MD

## 2010-04-03 NOTE — Consult Note (Signed)
NAME:  Brian Brian [name], Brian [name] NO.:  [id number]  MEDICAL RECORD NO.:  [id number]          Brian [name] TYPE:  EMS  LOCATION:  [facility]                         FACILITY:  [facility]  PHYSICIAN:  Marca Ancona, MD      DATE OF BIRTH:  09/27/1931  DATE OF CONSULTATION:  02/27/2010 DATE OF DISCHARGE:  02/27/2010                                CONSULTATION   PRIMARY CARE PHYSICIAN:  Titus Dubin. Alwyn Ren, MD, FACP, FCCP  REASON FOR CONSULTATION:  Chest discomfort.  HISTORY OF PRESENT ILLNESS:  Brian Brian [name] Brian [name] is a 77 year old African American male with no known history of coronary artery disease but CAD equivalent of NIDDM since 2000, hyperlipidemia, and gout, as well as history significant for AAA (3.8 x 3.7 cm per ultrasound today), who presents with increasing frequency of chest discomfort at rest since 2 weeks ago with significant worsening this morning. Brian Brian [name] Brian [name] was in his usual state of health until 2 weeks ago when he was awakened with substernal chest discomfort described as a dull ache. Brian Brian [name] Brian [name] waited several minutes and it resolved on its own, and he ignored Brian Brian [name] symptoms until last night when he had approximately 4 hours of chest discomfort again like a dull ache in Brian Brian [name] substernal area without radiation or associated symptoms.  He thought perhaps it was indigestion and ignored again until he was awakened this morning with a similar discomfort, more severe this time and was brought to [facility] ED by his lady friend.  Here in Brian Brian [name] emergency department, Brian Brian [name] Brian [name] was noted to have very minimal less than to 1 mm of inferior ST depression, otherwise, no significant ST changes on EKG.  Minimal troponin I elevation of 0.09 in Brian Brian [name] setting of renal insufficiency with a creatinine of 1.5.  Brian Brian [name] Brian [name] had an ultrasound of Brian Brian [name] aorta showing a distal aortic aneurysm, 3.8 x 3.7 cm.  Chest x-ray showed no acute findings.  Of note, Brian Brian [name] Brian [name] has no history of hypertension but was mildly  hypertensive on admission with systolic blood pressure peak at 162.  His heart rate has been in Brian Brian [name] 50s to 60s.  All other vital signs within normal limits and stable.  Currently, Brian Brian [name] Brian [name] is asymptomatic but did have slight recurrence of chest discomfort briefly.  Also of note, Brian Brian [name] Brian [name]'s symptoms resolved when he arrived at Brian Brian [name] emergency department after receiving Nitropaste.  Brian Brian [name] Brian [name] has also received Vicodin and 40 mg Lovenox injection as well as four baby aspirin.  PAST MEDICAL HISTORY: 1. NIDDM (Glucophage 500 mg p.o. daily), 2000. 2. Hyperlipidemia. 3. Gout. 4. AAA (3.8 x 3.7 cm per ultrasound of aorta, February 27, 2010). 5. Seasonal allergies.  SOCIAL HISTORY:  Brian Brian [name] Brian [name] is a widower.  He is retired.  He has remote tobacco abuse history quitting in 1997.  No significant EtOH or illicit drug use.  No herbal meds.  Carb-modified diet.  No regular exercise but he is active and independent in regard to daily activities.  FAMILY HISTORY:  Brian Brian [name] Brian [name] has one brother and one sister with history of myocardial infarction but negative for premature diagnosis of coronary artery disease.  REVIEW OF  SYSTEMS:  Please see HPI.  All other systems reviewed and were negative.  CODE STATUS:  Full.  ALLERGIES: 1. DEMEROL. 2. ACCUPRIL. 3. ARTIFICIAL SWEETENERS.  MEDICATIONS: 1. Enteric-coated aspirin 81 mg p.o. daily. 2. Enalapril 5 mg p.o. daily. 3. Glucophage 500 mg p.o. daily. 4. Pravachol 40 mg p.o. nightly. 5. Allopurinol 100 mg p.o. daily. 6. Triamcinolone acetonide 0.1% cream p.r.n. 7. Temazepam 7.5 mg p.o. nightly p.r.n.  PHYSICAL EXAMINATION:  VITAL SIGNS:  Temperature 97.8 degrees Fahrenheit, BP 105-162 over 43-75 with pulse in Brian Brian [name] 50s to low 60s, respiration rate 14-24, O2 saturation 97% on room air. GENERAL:  Brian Brian [name] Brian [name] is alert and oriented x3, in no apparent distress, able to speak easily in  full sentences without respiratory distress. Brian Brian [name] Brian [name] is  lying comfortably with head of bed at 15 degrees and one pillow. HEENT:  Head is normocephalic, atraumatic.  Pupils equal, round, reactive to light.  Extraocular muscles are intact.  Nares are patent without discharge.  Oropharynx without erythema or exudates. NECK:  Supple without lymphadenopathy.  No thyromegaly.  No JVD. CARDIAC:  Heart rate is regular with audible S1 and S2.  No clicks, rubs, murmurs, or gallops.  Pulses are 2+ and equal in both upper and lower extremities bilaterally. LUNGS:  Clear to auscultation bilaterally. SKIN:  No rashes, lesions, or petechiae. ABDOMEN:  Soft, nontender, nondistended.  Normal abdominal bowel sounds. No rebound or guarding.  No hepatosplenomegaly. EXTREMITIES:  Without clubbing, cyanosis, or edema. MUSCULOSKELETAL:  Without joint deformity or effusions.  No spinal or CVA tenderness. NEUROLOGIC:  Cranial nerves II through XII grossly intact.  Strength 5/5 in all extremities and axial groups.  Normal sensation throughout and normal cerebellar function.  RADIOLOGY:  An ultrasound of Brian Brian [name] aorta showed a distal aortic abdominal aneurysm at 3.8 x 3.7 cm. Chest x-ray showed no acute findings. EKG; normal sinus and sinus bradycardia.  Both tracings completed today show 0.5-1 mm of ST depression in inferior leads also on initial tracing completed this morning 0.5- to 1-mm ST depression in V5 and V6.  No significant Q-waves.  Normal axis and no evidence of hypertrophy.  When compared to a prior tracing from 2009, these inferior ST changes are new.  LABORATORY DATA:  WBC is 6.8 with normal differential, HGB is 13.9, HCT is 41.4, PLT count is 186.  Sodium 142, potassium 4.2, chloride 108, bicarb 25, BUN 22, creatinine 1.5, glucose 155, lipase 37.  Point-of- care markers negative x1.  First full set of enzymes, CK 118, MB 5.3, and troponin I of 0.09.  Pro time is 12.7.  INR is 0.93.  ASSESSMENT AND PLAN:  Brian Brian [name] Brian [name] was seen by both Jarrett Ables,  PA- C, and Dr. Marca Ancona.  Brian Brian [name] Brian [name] is a 77 year old African American male with no known history of coronary artery disease but coronary artery disease equivalent of non- insulin-dependent diabetes mellitus, as well as hyperlipidemia, and gout who presents with chest discomfort and mild troponin I elevation, as well as mild changes on his EKG consistent with ischemia. 1. Coronary artery disease:  I suspect non-ST-segment elevation     myocardial infarction with mild troponin elevation already.  He is     a diabetic and currently chest pain free.     a.     EC ASA 325 p.o. daily, load with Plavix, heparin per      pharmacy, Nitropaste, Lopressor 12.5 mg p.o. q.8 h., Crestor 40 mg      p.o. nightly.  b.     Hydrate and plan for left heart catheterization in Brian Brian [name] a.m.      as long as Brian Brian [name] Brian [name] does not have any significant recurrence of      chest discomfort.     c.     Cycle cardiac enzymes. 2. Renal insufficiency:  Creatinine 1.5 up from 1.1 in 2009.  No other     labs available for comparison.  We will hold enalapril in Brian Brian [name] a.m.     prior to cath tomorrow.  We will hydrate starting at 8:00 p.m.     tonight in anticipation of left heart catheterization in a.m. 3. Diabetes mellitus:  Insulin sliding scale sensitive and hold     metformin.  Thanks for Brian Brian [name] consult.  We will continue to follow.     Jarrett Ables, PAC   ______________________________ Marca Ancona, MD    MS/MEDQ  D:  02/27/2010  T:  02/28/2010  Job:  [id number]  Electronically Signed by Jarrett Ables PAC on 03/01/2010 10:44:38 AM Electronically Signed by Marca Ancona MD on 03/06/2010 09:26:36 AM

## 2010-04-04 ENCOUNTER — Other Ambulatory Visit: Payer: Self-pay | Admitting: Internal Medicine

## 2010-04-04 ENCOUNTER — Ambulatory Visit (INDEPENDENT_AMBULATORY_CARE_PROVIDER_SITE_OTHER): Payer: MEDICARE | Admitting: Internal Medicine

## 2010-04-04 ENCOUNTER — Encounter: Payer: Self-pay | Admitting: Internal Medicine

## 2010-04-04 DIAGNOSIS — H9319 Tinnitus, unspecified ear: Secondary | ICD-10-CM | POA: Insufficient documentation

## 2010-04-04 DIAGNOSIS — M25559 Pain in unspecified hip: Secondary | ICD-10-CM | POA: Insufficient documentation

## 2010-04-04 DIAGNOSIS — E119 Type 2 diabetes mellitus without complications: Secondary | ICD-10-CM

## 2010-04-04 LAB — BASIC METABOLIC PANEL
BUN: 19 mg/dL (ref 6–23)
Calcium: 9.2 mg/dL (ref 8.4–10.5)
Chloride: 103 mEq/L (ref 96–112)
Creatinine, Ser: 1.4 mg/dL (ref 0.4–1.5)

## 2010-04-05 LAB — HEMOGLOBIN A1C: Hgb A1c MFr Bld: 6.3 % (ref 4.6–6.5)

## 2010-04-06 NOTE — Assessment & Plan Note (Signed)
Summary: eph/wpa   Visit Type:  post hospital Primary Provider:  Marga Melnick MD  CC:  no complaints.  History of Present Illness: 77 yo AAM with h/o HTN, hyperlipidemia, DM who was admitted to [facility] January 2012 with chest pain and ruled in for an MI with cardiac enzymes. He underwent cardiac cath on 02/28/10 demonstrating triple vessel CAD. 3V CABG on 03/03/10 per Dr. Donata Clay. He has done well. He did have postoperative anemia and volume overload. He has been seen by Dr. Donata Clay this week for f/u.   He has been doing well. No chest pain, SOB, palpitations, dizziness, near syncope or syncope.  He has been taking all of his medications.   Lipids in hospital 03/02/10 reviewed. Total chol 136, HDL 47, LDL 74.   Current Medications (verified): 1)  Adult Aspirin Low Strength 81 Mg  Tbdp (Aspirin) 2)  Triamcinolone Acetonide 0.1 %  Crea (Triamcinolone Acetonide) .... Prn 3)  Allopurinol 100 Mg  Tabs (Allopurinol) .Marland Kitchen.. 1 Once Daily 4)  Pravachol 40 Mg  Tabs (Pravastatin Sodium) .... ***not On Discharge List*** 1 At Bedtime 5)  Claritin 10 Mg Tabs (Loratadine) .... As Needed 6)  Accu-Chek Plus Test Strip .... Test Once Daily 7)  Accu-Chek Plus Lancet .... Test Once Daily 8)  Temazepam 7.5 Mg Caps (Temazepam) .Marland Kitchen.. 1 By Mouth At Bedtime, Ch[facility] By [facility] 9)  Aggrenox 25-200 Mg Xr12h-Cap (Aspirin-Dipyridamole) .... Two Times A Day 10)  Metformin Hcl 500 Mg Tabs (Metformin Hcl) .... Two Times A Day 11)  Pravastatin Sodium 40 Mg Tabs (Pravastatin Sodium) .... Take One Tablet By Mouth Daily At Bedtime 12)  Vitamin D 1000 Unit Tabs (Cholecalciferol) .... At Bedtime 13)  Ultram 50 Mg Tabs (Tramadol Hcl) .Marland Kitchen.. 1-2 Tablets Every 4 To 6 Hours As Needed For Pain  Allergies (verified): 1)  ! Demerol 2)  ! Accupril 3)  ! * Artifical Sweetners  Past History:  Past Medical History: CAD s/p 3V CABG 03/03/10. Diabetes mellitus, onset 2000 Gout ED Hyperlipidemia COLON POLYPS, PMH of  (none 2009), Dr Russella Dar ;  Macular Degeneration AAA  Past Surgical History: Perirectal abscess & fistulae 1953 (surgery x7 from 1953 to 1972) Colonoscopy neg 2009,Dr Stark ,due 2014 Inguinal herniorrhaphy  Cataract left eye 3 V CABG  Family History: Reviewed history from 07/15/2008 and no changes required. Father:deceased, MI @ 15 ,TB Mother: deceased, CHF Siblings: brother MI @ 54,DM ; sister MI @ 74; health hx of GP unknown  Social History: Reviewed history from 07/15/2008 and no changes required. Retired-Sales Rep for [employer] Former Smoker: 1998 Alcohol use-yes: socially  Regular exercise-no( see HPI) No illicit drug use  Review of Systems  The patient denies fatigue, malaise, fever, weight gain/loss, vision loss, decreased hearing, hoarseness, chest pain, palpitations, shortness of breath, prolonged cough, wheezing, sleep apnea, coughing up blood, abdominal pain, blood in stool, nausea, vomiting, diarrhea, heartburn, incontinence, blood in urine, muscle weakness, joint pain, leg swelling, rash, skin lesions, headache, fainting, dizziness, depression, anxiety, enlarged lymph nodes, easy bruising or bleeding, and environmental allergies.    Vital Signs:  Patient profile:   77 year old male Height:      71 inches Weight:      173 pounds BMI:     24.22 Pulse rate:   79 / minute BP sitting:   112 / 64  (left arm) Cuff size:   regular  Vitals Entered By: Hardin Negus, RMA (March 31, 2010 1:57 PM)  Physical Exam  General:  General: Well developed, well nourished, NAD HEENT: OP clear, mucus membranes moist SKIN: warm, dry. Sternal wound well healed.  Neuro: No focal deficits Musculoskeletal: Muscle strength 5/5 all ext Psychiatric: Mood and affect normal Neck: No JVD, no carotid bruits, no thyromegaly, no lymphadenopathy. Lungs:Clear bilaterally, no wheezes, rhonci, crackles CV: RRR no murmurs, gallops rubs Abdomen: soft, NT, ND, BS present Extremities: No  edema, pulses 2+.    EKG  Procedure date:  03/31/2010  Findings:      NSR, rate 79 bpm. TWI precordial leads post bypass.   Cardiac Cath  Procedure date:  02/28/2010  Findings:       1. The left main coronary artery had a 40-50% mid stenosis.   2. The left anterior descending artery was a large vessel that coursed       to the apex and gave off a moderate-sized diagonal branch.  The       proximal LAD had mild plaque disease.  The mid LAD had a 95% hazy       stenosis.  The distal LAD had diffuse plaque with 1 focal area of       40% stenosis.  First diagonal was moderate size with mild plaque       disease.   3. The circumflex artery was composed mainly of a large obtuse       marginal branch which had a 100% occlusion.   4. The right coronary artery is a large dominant vessel with plaque       throughout the proximal portion and a discrete 80% stenosis in the       midportion of the vessel.  There is diffuse plaque throughout the       distal vessel with a discrete 50% stenosis.  The posterior       descending artery and posterolateral branch had diffuse plaque but       no focally obstructive lesions.   5. Left ventricular angiogram was performed in the RAO projection that       showed normal left ventricular systolic function with ejection       fraction of 50-55%.   Echocardiogram  Procedure date:  02/28/2010  Findings:       Left ventricle: Wall thickness was increased in a pattern of mild     LVH. Systolic function was normal. The estimated ejection fraction     was in the range of 50% to 55%.   - Right ventricle: The cavity size was mildly decreased.   - Pulmonary arteries: Systolic pressure was mildly increased. PA     peak pressure: 41mm Hg (S)  Impression & Recommendations:  Problem # 1:  CORONARY ARTERY BYPASS GRAFT, HX OF (ICD-V45.81) Stable post bypass surgery. Continue ASA and beta blocker. Will d/c Aggrenox.  Will check BMET today. If his creatinine  is stable, will restart Enalapril 5mg  by mouth Qdaily.   Orders: TLB-BMP (Basic Metabolic Panel-BMET) (80048-METABOL) EKG w/ Interpretation (93000)  Problem # 2:  AAA (ICD-441.4) Will need yearly u/s. Will repeat at next visit.   Patient Instructions: 1)  Your physician recommends that you schedule a follow-up appointment in: 6 months. 2)  Your physician recommends that you continue on your current medications as directed. Please refer to the Current Medication list given to you today. Prescriptions: METOPROLOL TARTRATE 25 MG TABS (METOPROLOL TARTRATE) Take one half  tablet by mouth twice a day  #30 x 8   Entered by:  Whitney Maeola Sarah RN   Authorized by:   Verne Carrow, MD   Signed by:   Ellender Hose RN on 03/31/2010   Method used:   Electronically to        [facility] [id number] (retail)       [address] [place], [place]  [address]       Ph: [phone]       Fax: [phone]   RxID:   [id number]   Appended Document: eph/wpa Dear Mr. Vallin, Thank you for having your records forwarded to me . I reviewed your past medical history; you had done everything you could possibly do from a preventive care standpoint to protect your health. I think that helped prevent more severe complications. You will be in my thoughts & prayers. Hopp  Appended Document: eph/wpa mailed.

## 2010-04-06 NOTE — Consult Note (Signed)
Summary: [facility] Consultation Report  [facility] Consultation Report   Imported By: Earl Many 03/31/2010 16:59:02  _____________________________________________________________________  External Attachment:    Type:   Image     Comment:   External Document

## 2010-04-06 NOTE — Miscellaneous (Signed)
  Clinical Lists Changes  Observations: Added new observation of ECHOINTERP:   - Left ventricle: Wall thickness was increased in a pattern of mild     LVH. Systolic function was normal. The estimated ejection fraction     was in the range of 50% to 55%.   - Right ventricle: The cavity size was mildly decreased.   - Pulmonary arteries: Systolic pressure was mildly increased. PA     peak pressure: 41mm Hg (S). (02/28/2010 8:06) Added new observation of CARDCATHFIND: IMPRESSION: 1. Triple-vessel coronary artery disease. 2. Non-ST-elevation myocardial infarction. 3. Preserved left ventricular systolic function.   RECOMMENDATIONS:  This patient has severe three-vessel coronary artery disease.  He also has a history of diabetes mellitus.  I feel that surgical revascularization would be the best option for him at this time.  We will place a consultation to cardiothoracic surgery for possible coronary artery bypass grafting surgery.  We will check a P2Y12 platelet reactivity test today.  Unfortunately the patient was loaded with Plavix yesterday.  So likely delay his surgical procedure.   (02/28/2010 8:04)      Cardiac Cath  Procedure date:  02/28/2010  Findings:      IMPRESSION: 1. Triple-vessel coronary artery disease. 2. Non-ST-elevation myocardial infarction. 3. Preserved left ventricular systolic function.   RECOMMENDATIONS:  This patient has severe three-vessel coronary artery disease.  He also has a history of diabetes mellitus.  I feel that surgical revascularization would be the best option for him at this time.  We will place a consultation to cardiothoracic surgery for possible coronary artery bypass grafting surgery.  We will check a P2Y12 platelet reactivity test today.  Unfortunately the patient was loaded with Plavix yesterday.  So likely delay his surgical procedure.    Echocardiogram  Procedure date:  02/28/2010  Findings:        - Left ventricle: Wall  thickness was increased in a pattern of mild     LVH. Systolic function was normal. The estimated ejection fraction     was in the range of 50% to 55%.   - Right ventricle: The cavity size was mildly decreased.   - Pulmonary arteries: Systolic pressure was mildly increased. PA     peak pressure: 41mm Hg (S).

## 2010-04-11 ENCOUNTER — Telehealth: Payer: Self-pay | Admitting: Cardiovascular Disease

## 2010-04-12 NOTE — Letter (Signed)
Summary: [facility]  [facility]   Imported By: Maryln Gottron 04/05/2010 09:57:53  _____________________________________________________________________  External Attachment:    Type:   Image     Comment:   External Document

## 2010-04-12 NOTE — Progress Notes (Signed)
Summary: [facility]: Office Visit  [facility]: Office Visit   Imported By: Earl Many 04/04/2010 17:14:22  _____________________________________________________________________  External Attachment:    Type:   Image     Comment:   External Document

## 2010-04-12 NOTE — Assessment & Plan Note (Signed)
Summary: heart surg 03/03/2010---needs followup with pcp///sph   ([employer] .Marland KitchenMarland Kitchen   Vital Signs:  Patient profile:   77 year old male Weight:      174.4 pounds BMI:     24.41 Pulse rate:   72 / minute Resp:     14 per minute BP sitting:   116 / 62  (left arm) Cuff size:   regular  Vitals Entered By: Shonna Chock CMA (April 04, 2010 3:11 PM) CC: 1.) Follow-up visit: patient had heart surgery 03/03/10     2.) Sound in left ear x 2-3 days   3.) Left Hip concerns  4.)? if ok to sleep on left side, Type 2 diabetes mellitus follow-up, Lower Extremity Joint pain   Primary Care Provider:  Marga Melnick MD  CC:  1.) Follow-up visit: patient had heart surgery 03/03/10     2.) Sound in left ear x 2-3 days   3.) Left Hip concerns  4.)? if ok to sleep on left side, Type 2 diabetes mellitus follow-up, and Lower Extremity Joint pain.  History of Present Illness:    D/C Summary reviewed ; copy provided for him to take to [facility]. In Hospital his A1c was7.2%; it was  6.0% in 06/2009. He  reports weight loss since surgery due to anorexia, but denies polyuria, polydipsia, blurred vision, self managed hypoglycemia, and numbness of extremities.  The patient denies the following symptoms: neuropathic pain, chest pain, vomiting, orthostatic symptoms, poor wound healing, intermittent claudication, vision loss, and foot ulcer.  Since the last visit the patient reports good dietary compliance, exercising regularly, and monitoring blood glucose.  The patient has been measuring capillary blood glucose before breakfast, 108-138.       The patient also presents with Lower Extremity Joint pain which began while bedridden in [facility]. The patient reports decreased ROM, but denies swelling, redness, giving away, locking, popping, and stiffness for >1 hr.  The pain is located in the left hip &  began without  injury.  The pain is described as sharp, intermittent, and occuring at rest.  It resolved when he was lifted on to operating table ,  but it recurred 3 days  ago @ home.The patient denies the following symptoms: fever, rash, photosensitivity, eye symptoms, diarrhea, and dysuria.  Rx: Tramadol  helps.  Current Medications (verified): 1)  Adult Aspirin Low Strength 81 Mg  Tbdp (Aspirin) 2)  Triamcinolone Acetonide 0.1 %  Crea (Triamcinolone Acetonide) .... Prn 3)  Allopurinol 100 Mg  Tabs (Allopurinol) .Marland Kitchen.. 1 Once Daily 4)  Pravachol 40 Mg  Tabs (Pravastatin Sodium) .... ***not On Discharge List*** 1 At Bedtime 5)  Claritin 10 Mg Tabs (Loratadine) .... As Needed 6)  Accu-Chek Plus Test Strip .... Test Once Daily 7)  Accu-Chek Plus Lancet .... Test Once Daily 8)  Temazepam 7.5 Mg Caps (Temazepam) .Marland Kitchen.. 1 By Mouth At Bedtime, Changed By [facility] 9)  Aggrenox 25-200 Mg Xr12h-Cap (Aspirin-Dipyridamole) .... Two Times A Day 10)  Metformin Hcl 500 Mg Tabs (Metformin Hcl) .... Two Times A Day 11)  Pravastatin Sodium 40 Mg Tabs (Pravastatin Sodium) .... Take One Tablet By Mouth Daily At Bedtime 12)  Vitamin D 1000 Unit Tabs (Cholecalciferol) .... At Bedtime 13)  Ultram 50 Mg Tabs (Tramadol Hcl) .Marland Kitchen.. 1-2 Tablets Every 4 To 6 Hours As Needed For Pain 14)  Metoprolol Tartrate 25 Mg Tabs (Metoprolol Tartrate) .... Take One Half  Tablet By Mouth Twice A Day  Allergies: 1)  ! Demerol 2)  !  Accupril 3)  ! * Artifical Sweetners 4)  ! Ambien (Zolpidem Tartrate)  Review of Systems ENT:  Denies decreased hearing and earache; Roaring in L ear .  Physical Exam  General:  Appears tired but in no acute distress; alert,appropriate and cooperative throughout examination Ears:  External ear exam shows no significant lesions or deformities.  Otoscopic examination reveals clear canals, tympanic membranes are intact bilaterally without bulging, retraction, inflammation or discharge.  Hearing decreased to whisper in  L ear Lungs:  Normal respiratory effort, chest expands symmetrically. Lungs are clear to auscultation, no crackles or wheezes. Heart:   Normal rate and regular rhythm. S1 and S2 normal without gallop, murmur, click, rub .S4 Pulses:  R and L carotid,radial  pulses are full and equal bilaterally. Decreased pedal pulses w/o ischmic change Extremities:  No clubbing, cyanosis, edema. Good nail health. Pain Lposterior hip area with hip ROM.   Neurologic:  alert & oriented X3 and sensation intact to light touch over feet.     Impression & Recommendations:  Problem # 1:  DIABETES-TYPE 2 (ICD-250.00)  His updated medication list for this problem includes:    Adult Aspirin Low Strength 81 Mg Tbdp (Aspirin)    Metformin Hcl 500 Mg Tabs (Metformin hcl) .Marland Kitchen..Marland Kitchen Two times a day  Orders: Venipuncture (13086) TLB-A1C / Hgb A1C (Glycohemoglobin) (83036-A1C) TLB-Microalbumin/Creat Ratio, Urine (82043-MALB) Specimen Handling (57846)  Problem # 2:  TINNITUS (ICD-388.30)  "roaring " L ear with hearing loss  Orders: Audiology (Audio)  Problem # 3:  HIP PAIN, LEFT (ICD-719.45)  His updated medication list for this problem includes:    Adult Aspirin Low Strength 81 Mg Tbdp (Aspirin)    Ultram 50 Mg Tabs (Tramadol hcl) .Marland Kitchen... 1-2 tablets every 4 to 6 hours as needed for pain  Complete Medication List: 1)  Adult Aspirin Low Strength 81 Mg Tbdp (Aspirin) 2)  Triamcinolone Acetonide 0.1 % Crea (Triamcinolone acetonide) .... Prn 3)  Allopurinol 100 Mg Tabs (allopurinol)  .Marland Kitchen.. 1 once daily 4)  Pravachol 40 Mg Tabs (Pravastatin sodium) .... ***not on discharge list*** 1 at bedtime 5)  Claritin 10 Mg Tabs (Loratadine) .... As needed 6)  Accu-chek Plus Test Strip  .... Test once daily 7)  Accu-chek Plus Lancet  .... Test once daily 8)  Temazepam 7.5 Mg Caps (Temazepam) .Marland Kitchen.. 1 by mouth at bedtime, changed by the va 9)  Aggrenox 25-200 Mg Xr12h-cap (Aspirin-dipyridamole) .... Two times a day 10)  Metformin Hcl 500 Mg Tabs (Metformin hcl) .... Two times a day 11)  Pravastatin Sodium 40 Mg Tabs (Pravastatin sodium) .... Take one tablet by mouth  daily at bedtime 12)  Vitamin D 1000 Unit Tabs (Cholecalciferol) .... At bedtime 13)  Ultram 50 Mg Tabs (Tramadol hcl) .Marland Kitchen.. 1-2 tablets every 4 to 6 hours as needed for pain 14)  Metoprolol Tartrate 25 Mg Tabs (Metoprolol tartrate) .... Take one half  tablet by mouth twice a day  Patient Instructions: 1)  Physical Therapy @ Cardiac  Rehab should evaluate hip pain & gait. 2)  Check your blood sugars regularly. If your readings are usually above : 150 or below 90 you should contact our office. 3)  See your eye doctor yearly to check for diabetic eye damage. 4)  Check your feet each night for sore areas, calluses or signs of infection. Prescriptions: ULTRAM 50 MG TABS (TRAMADOL HCL) 1-2 tablets every 4 to 6 hours as needed for pain  #60 x 2   Entered and Authorized by:  Marga Melnick MD   Signed by:   Marga Melnick MD on 04/04/2010   Method used:   Print then Give to Patient   RxID:   [id number]    Orders Added: 1)  Est. Patient Level IV [14782] 2)  Venipuncture [95621] 3)  TLB-A1C / Hgb A1C (Glycohemoglobin) [83036-A1C] 4)  TLB-Microalbumin/Creat Ratio, Urine [82043-MALB] 5)  Specimen Handling [99000] 6)  Audiology [Audio]

## 2010-04-17 ENCOUNTER — Encounter (HOSPITAL_COMMUNITY): Payer: MEDICARE

## 2010-04-17 ENCOUNTER — Ambulatory Visit (HOSPITAL_COMMUNITY): Payer: MEDICARE

## 2010-04-17 ENCOUNTER — Other Ambulatory Visit: Payer: Self-pay

## 2010-04-17 ENCOUNTER — Encounter (HOSPITAL_COMMUNITY): Payer: MEDICARE | Attending: Cardiovascular Disease

## 2010-04-17 DIAGNOSIS — Z951 Presence of aortocoronary bypass graft: Secondary | ICD-10-CM | POA: Insufficient documentation

## 2010-04-17 DIAGNOSIS — E119 Type 2 diabetes mellitus without complications: Secondary | ICD-10-CM | POA: Insufficient documentation

## 2010-04-17 DIAGNOSIS — I714 Abdominal aortic aneurysm, without rupture, unspecified: Secondary | ICD-10-CM | POA: Insufficient documentation

## 2010-04-17 DIAGNOSIS — N189 Chronic kidney disease, unspecified: Secondary | ICD-10-CM | POA: Insufficient documentation

## 2010-04-17 DIAGNOSIS — I472 Ventricular tachycardia, unspecified: Secondary | ICD-10-CM | POA: Insufficient documentation

## 2010-04-17 DIAGNOSIS — E785 Hyperlipidemia, unspecified: Secondary | ICD-10-CM | POA: Insufficient documentation

## 2010-04-17 DIAGNOSIS — J4489 Other specified chronic obstructive pulmonary disease: Secondary | ICD-10-CM | POA: Insufficient documentation

## 2010-04-17 DIAGNOSIS — I4729 Other ventricular tachycardia: Secondary | ICD-10-CM | POA: Insufficient documentation

## 2010-04-17 DIAGNOSIS — I129 Hypertensive chronic kidney disease with stage 1 through stage 4 chronic kidney disease, or unspecified chronic kidney disease: Secondary | ICD-10-CM | POA: Insufficient documentation

## 2010-04-17 DIAGNOSIS — N289 Disorder of kidney and ureter, unspecified: Secondary | ICD-10-CM | POA: Insufficient documentation

## 2010-04-17 DIAGNOSIS — Z7982 Long term (current) use of aspirin: Secondary | ICD-10-CM | POA: Insufficient documentation

## 2010-04-17 DIAGNOSIS — I2510 Atherosclerotic heart disease of native coronary artery without angina pectoris: Secondary | ICD-10-CM | POA: Insufficient documentation

## 2010-04-17 DIAGNOSIS — J449 Chronic obstructive pulmonary disease, unspecified: Secondary | ICD-10-CM | POA: Insufficient documentation

## 2010-04-17 DIAGNOSIS — Z87891 Personal history of nicotine dependence: Secondary | ICD-10-CM | POA: Insufficient documentation

## 2010-04-17 DIAGNOSIS — Z5189 Encounter for other specified aftercare: Secondary | ICD-10-CM | POA: Insufficient documentation

## 2010-04-17 DIAGNOSIS — Z79899 Other long term (current) drug therapy: Secondary | ICD-10-CM | POA: Insufficient documentation

## 2010-04-17 DIAGNOSIS — I252 Old myocardial infarction: Secondary | ICD-10-CM | POA: Insufficient documentation

## 2010-04-18 ENCOUNTER — Encounter: Payer: Self-pay | Admitting: Internal Medicine

## 2010-04-18 LAB — GLUCOSE, CAPILLARY
Glucose-Capillary: 100 mg/dL — ABNORMAL HIGH (ref 70–99)
Glucose-Capillary: 99 mg/dL (ref 70–99)

## 2010-04-18 NOTE — Progress Notes (Signed)
Summary: LAB RESULTS  Phone Note Call from Patient Call back at Home Phone [phone]   Caller: Patient Reason for Call: Talk to Nurse, Lab or Test Results Summary of Call: PT CALLING FOR LAB RESULTS Initial call taken by: Edman Circle,  April 11, 2010 10:02 AM  Follow-up for Phone Call        Patient aware of lab results. Since his renal function is okay, he will restart Enalapril 5mg  daily. Whitney Maeola Sarah RN  April 11, 2010 1:56 PM  Follow-up by: Whitney Maeola Sarah RN,  April 11, 2010 1:56 PM    New/Updated Medications: ENALAPRIL MALEATE 5 MG TABS (ENALAPRIL MALEATE) take one tablet by mouth daily.

## 2010-04-19 ENCOUNTER — Other Ambulatory Visit: Payer: Self-pay

## 2010-04-19 ENCOUNTER — Encounter (HOSPITAL_COMMUNITY): Payer: MEDICARE

## 2010-04-20 LAB — GLUCOSE, CAPILLARY: Glucose-Capillary: 137 mg/dL — ABNORMAL HIGH (ref 70–99)

## 2010-04-21 ENCOUNTER — Other Ambulatory Visit: Payer: Self-pay

## 2010-04-21 ENCOUNTER — Encounter (HOSPITAL_COMMUNITY): Payer: MEDICARE | Attending: Cardiovascular Disease

## 2010-04-21 ENCOUNTER — Encounter: Payer: Self-pay | Admitting: Cardiovascular Disease

## 2010-04-21 ENCOUNTER — Encounter (HOSPITAL_COMMUNITY): Payer: MEDICARE

## 2010-04-21 DIAGNOSIS — I472 Ventricular tachycardia, unspecified: Secondary | ICD-10-CM | POA: Insufficient documentation

## 2010-04-21 DIAGNOSIS — E119 Type 2 diabetes mellitus without complications: Secondary | ICD-10-CM | POA: Insufficient documentation

## 2010-04-21 DIAGNOSIS — I714 Abdominal aortic aneurysm, without rupture, unspecified: Secondary | ICD-10-CM | POA: Insufficient documentation

## 2010-04-21 DIAGNOSIS — Z7982 Long term (current) use of aspirin: Secondary | ICD-10-CM | POA: Insufficient documentation

## 2010-04-21 DIAGNOSIS — J449 Chronic obstructive pulmonary disease, unspecified: Secondary | ICD-10-CM | POA: Insufficient documentation

## 2010-04-21 DIAGNOSIS — I4729 Other ventricular tachycardia: Secondary | ICD-10-CM | POA: Insufficient documentation

## 2010-04-21 DIAGNOSIS — I129 Hypertensive chronic kidney disease with stage 1 through stage 4 chronic kidney disease, or unspecified chronic kidney disease: Secondary | ICD-10-CM | POA: Insufficient documentation

## 2010-04-21 DIAGNOSIS — N289 Disorder of kidney and ureter, unspecified: Secondary | ICD-10-CM | POA: Insufficient documentation

## 2010-04-21 DIAGNOSIS — Z87891 Personal history of nicotine dependence: Secondary | ICD-10-CM | POA: Insufficient documentation

## 2010-04-21 DIAGNOSIS — Z951 Presence of aortocoronary bypass graft: Secondary | ICD-10-CM | POA: Insufficient documentation

## 2010-04-21 DIAGNOSIS — J4489 Other specified chronic obstructive pulmonary disease: Secondary | ICD-10-CM | POA: Insufficient documentation

## 2010-04-21 DIAGNOSIS — N189 Chronic kidney disease, unspecified: Secondary | ICD-10-CM | POA: Insufficient documentation

## 2010-04-21 DIAGNOSIS — Z79899 Other long term (current) drug therapy: Secondary | ICD-10-CM | POA: Insufficient documentation

## 2010-04-21 DIAGNOSIS — I2510 Atherosclerotic heart disease of native coronary artery without angina pectoris: Secondary | ICD-10-CM | POA: Insufficient documentation

## 2010-04-21 DIAGNOSIS — E785 Hyperlipidemia, unspecified: Secondary | ICD-10-CM | POA: Insufficient documentation

## 2010-04-21 DIAGNOSIS — I252 Old myocardial infarction: Secondary | ICD-10-CM | POA: Insufficient documentation

## 2010-04-21 DIAGNOSIS — Z5189 Encounter for other specified aftercare: Secondary | ICD-10-CM | POA: Insufficient documentation

## 2010-04-21 LAB — GLUCOSE, CAPILLARY: Glucose-Capillary: 94 mg/dL (ref 70–99)

## 2010-04-24 ENCOUNTER — Encounter (HOSPITAL_COMMUNITY): Payer: MEDICARE

## 2010-04-25 ENCOUNTER — Ambulatory Visit (INDEPENDENT_AMBULATORY_CARE_PROVIDER_SITE_OTHER): Payer: MEDICARE | Admitting: Internal Medicine

## 2010-04-25 ENCOUNTER — Encounter: Payer: Self-pay | Admitting: Internal Medicine

## 2010-04-25 DIAGNOSIS — J209 Acute bronchitis, unspecified: Secondary | ICD-10-CM

## 2010-04-26 ENCOUNTER — Encounter (HOSPITAL_COMMUNITY): Payer: MEDICARE

## 2010-04-27 NOTE — Therapy (Signed)
Summary: [facility]iology Evaluation/[facility]  [facility]iology Evaluation/[facility]   Imported By: Maryln Gottron 04/21/2010 08:59:17  _____________________________________________________________________  External Attachment:    Type:   Image     Comment:   External Document

## 2010-04-28 ENCOUNTER — Encounter (HOSPITAL_COMMUNITY): Payer: MEDICARE

## 2010-05-01 ENCOUNTER — Encounter (HOSPITAL_COMMUNITY): Payer: MEDICARE

## 2010-05-01 ENCOUNTER — Other Ambulatory Visit: Payer: Self-pay

## 2010-05-02 LAB — GLUCOSE, CAPILLARY: Glucose-Capillary: 180 mg/dL — ABNORMAL HIGH (ref 70–99)

## 2010-05-02 NOTE — Assessment & Plan Note (Signed)
Summary: cough and chest congestion/cdj   Vital Signs:  Patient profile:   77 year old male Weight:      166.8 pounds BMI:     23.35 Temp:     98.3 degrees F oral Pulse rate:   72 / minute Resp:     16 per minute BP sitting:   110 / 62  (left arm) Cuff size:   regular  Vitals Entered By: Shonna Chock CMA (April 25, 2010 10:26 AM) CC: Chest cold since last Thursday , URI symptoms   Primary Care Provider:  Marga Melnick MD  CC:  Chest cold since last Thursday  and URI symptoms.  History of Present Illness:    Onset 04/20/2010 as cough with white sputum; he now  reports persistent productive cough, but denies nasal congestion, purulent nasal discharge, sore throat, and earache.  The patient denies fever, dyspnea, and wheezing.  The patient denies headache.  The patient denies the following risk factors for Strep sinusitis: unilateral facial pain and tender adenopathy.  Pneumovax & Flu shots are up to date. Rx: Mucinex. FBS 94-120.  Current Medications (verified): 1)  Adult Aspirin Low Strength 81 Mg  Tbdp (Aspirin) 2)  Triamcinolone Acetonide 0.1 %  Crea (Triamcinolone Acetonide) .... Prn 3)  Allopurinol 100 Mg  Tabs (Allopurinol) .Marland Kitchen.. 1 Once Daily 4)  Claritin 10 Mg Tabs (Loratadine) .... As Needed 5)  Accu-Chek Plus Test Strip .... Test Once Daily 6)  Accu-Chek Plus Lancet .... Test Once Daily 7)  Temazepam 7.5 Mg Caps (Temazepam) .Marland Kitchen.. 1 By Mouth At Bedtime, Changed By [facility] 8)  Metformin Hcl 500 Mg Tabs (Metformin Hcl) .... Two Times A Day 9)  Pravastatin Sodium 40 Mg Tabs (Pravastatin Sodium) .... Take One Tablet By Mouth Daily At Bedtime 10)  Vitamin D 1000 Unit Tabs (Cholecalciferol) .... At Bedtime 11)  Ultram 50 Mg Tabs (Tramadol Hcl) .Marland Kitchen.. 1-2 Tablets Every 4 To 6 Hours As Needed For Pain 12)  Metoprolol Tartrate 25 Mg Tabs (Metoprolol Tartrate) .... Take One Half  Tablet By Mouth Twice A Day 13)  Enalapril Maleate 5 Mg Tabs (Enalapril Maleate) .... Take One Tablet By  Mouth Daily. 14)  Mucinex 600 Mg Xr12h-Tab (Guaifenesin) .... As Needed  Allergies: 1)  ! Demerol 2)  ! Accupril 3)  ! * Artifical Sweetners 4)  ! Ambien (Zolpidem Tartrate)  Physical Exam  General:  Appears tired but in no acute distress; alert,appropriate and cooperative throughout examination Ears:  R ear  and L ear  have wax Nose:  External nasal examination shows no deformity or inflammation. Nasal mucosa are  dry  without lesions or exudates. Mouth:  Oral mucosa and oropharynx without lesions or exudates.  Dentures Lungs:  Normal respiratory effort, chest expands symmetrically. Lungs are clear to auscultation, no crackles or wheezes. Cervical Nodes:  No lymphadenopathy noted Axillary Nodes:  No palpable lymphadenopathy   Impression & Recommendations:  Problem # 1:  BRONCHITIS-ACUTE (ICD-466.0)  His updated medication list for this problem includes:    Mucinex 600 Mg Xr12h-tab (Guaifenesin) .Marland Kitchen... As needed    Azithromycin 250 Mg Tabs (Azithromycin) .Marland Kitchen... As per pack  Orders: Prescription Created Electronically [phone])  Problem # 2:  GOUT (ICD-274.9) well controlled  Complete Medication List: 1)  Adult Aspirin Low Strength 81 Mg Tbdp (Aspirin) 2)  Triamcinolone Acetonide 0.1 % Crea (Triamcinolone acetonide) .... Prn 3)  Allopurinol 100 Mg Tabs (allopurinol)  .Marland Kitchen.. 1 once daily 4)  Claritin 10 Mg Tabs (Loratadine) .Marland KitchenMarland KitchenMarland Kitchen  As needed 5)  Accu-chek Plus Test Strip  .... Test once daily 6)  Accu-chek Plus Lancet  .... Test once daily 7)  Temazepam 7.5 Mg Caps (Temazepam) .Marland Kitchen.. 1 by mouth at bedtime, changed by the va 8)  Metformin Hcl 500 Mg Tabs (Metformin hcl) .... Two times a day 9)  Pravastatin Sodium 40 Mg Tabs (Pravastatin sodium) .... Take one tablet by mouth daily at bedtime 10)  Vitamin D 1000 Unit Tabs (Cholecalciferol) .... At bedtime 11)  Ultram 50 Mg Tabs (Tramadol hcl) .Marland Kitchen.. 1-2 tablets every 4 to 6 hours as needed for pain 12)  Metoprolol Tartrate 25 Mg Tabs  (Metoprolol tartrate) .... Take one half  tablet by mouth twice a day 13)  Enalapril Maleate 5 Mg Tabs (Enalapril maleate) .... Take one tablet by mouth daily. 14)  Mucinex 600 Mg Xr12h-tab (Guaifenesin) .... As needed 15)  Azithromycin 250 Mg Tabs (Azithromycin) .... As per pack  Patient Instructions: 1)  Use Mucinex DM as needed for cough. 2)  Drink as much NON dairy  fluid as you can tolerate for the next few days. Prescriptions: AZITHROMYCIN 250 MG TABS (AZITHROMYCIN) as per pack  #1 x 0   Entered and Authorized by:   Marga Melnick MD   Signed by:   Marga Melnick MD on 04/25/2010   Method used:   Electronically to        [facility] [id number] (retail)       [address] [place], [place]  [address]       Ph: [phone]       Fax: [phone]   RxID:   [id number]    Orders Added: 1)  Est. Patient Level III [84132] 2)  Prescription Created Electronically [phone]

## 2010-05-03 ENCOUNTER — Encounter (HOSPITAL_COMMUNITY): Payer: MEDICARE

## 2010-05-03 ENCOUNTER — Other Ambulatory Visit: Payer: Self-pay

## 2010-05-05 ENCOUNTER — Other Ambulatory Visit: Payer: Self-pay

## 2010-05-05 ENCOUNTER — Encounter (HOSPITAL_COMMUNITY): Payer: MEDICARE

## 2010-05-08 ENCOUNTER — Encounter (HOSPITAL_COMMUNITY): Payer: MEDICARE

## 2010-05-10 ENCOUNTER — Other Ambulatory Visit: Payer: Self-pay

## 2010-05-10 ENCOUNTER — Ambulatory Visit (INDEPENDENT_AMBULATORY_CARE_PROVIDER_SITE_OTHER): Payer: Self-pay | Admitting: Cardiothoracic Surgery

## 2010-05-10 ENCOUNTER — Encounter (HOSPITAL_COMMUNITY): Payer: MEDICARE

## 2010-05-10 DIAGNOSIS — I2510 Atherosclerotic heart disease of native coronary artery without angina pectoris: Secondary | ICD-10-CM

## 2010-05-11 NOTE — Assessment & Plan Note (Signed)
OFFICE VISIT  SIGMUND, MORERA A DOB:  12-07-1931                                        May 10, 2010 CHART #:  [id number]  CURRENT PROBLEMS: 1. Status post coronary artery bypass graft x3 on March 03, 2010 for     subendocardial myocardial infarction with unstable angina. 2. Chronic obstructive pulmonary disease with transient requirement of     home oxygen therapy following heart surgery, now improved. 3. Diabetes mellitus type 2.  PRESENT ILLNESS:  Mr. Weiand is a 77 year old gentleman returns for final surgical followup, 2 months after multivessel bypass grafting for unstable angina and he presented with chest pain and positive cardiac enzymes.  He is done well now and is performing well at cardiac rehab. He denies recurrent symptoms of angina and the surgical incisions are healing well.  His home oxygen has been removed and he has no breathing problems.  He remains on several medications monitored by his primary care physician and cardiologist including pravastatin, enalapril, metformin, tramadol, allopurinol, aspirin, metoprolol, vitamin D, and temazepam.  PHYSICAL EXAMINATION:  Vital Signs:  Blood pressure 114/70, pulse 90 and regular, respirations 18, saturation 94% on room air.  General:  He is alert and pleasant.  Chest:  Breath sounds are somewhat distant, but clear.  His sternal incision is well healed.  Cardiac:  Rhythm is regular without murmur or gallop.  Leg incision well healed.  There is no peripheral edema.  Neurologic exam is nonfocal.  IMPRESSION AND PLAN:  The patient has done well 2 months after surgery, understands that he can lift up to 20 pounds and then discontinue any limitation on activity after mid April.  He did complain of some isolated episodes (two) of expressive dysphasia.  I reviewed his carotid duplex scans and they were without significant disease preoperatively. He is only taking 81 mg of aspirin a day and I told  him to double that dose for to take a full 324 dose daily.  These symptoms have been isolated and are not increasing.  Otherwise, he seems to be doing well and will return here as needed.  Kerin Perna, M.D. Electronically Signed  PV/MEDQ  D:  05/10/2010  T:  05/11/2010  Job:  [id number]  cc:   Marca Ancona, MD Titus Dubin. Alwyn Ren, MD,FACP,FCCP

## 2010-05-12 ENCOUNTER — Encounter (HOSPITAL_COMMUNITY): Payer: MEDICARE

## 2010-05-15 ENCOUNTER — Encounter (HOSPITAL_COMMUNITY): Payer: MEDICARE

## 2010-05-17 ENCOUNTER — Encounter (HOSPITAL_COMMUNITY): Payer: MEDICARE

## 2010-05-18 NOTE — Letter (Signed)
Summary: [facility]  [facility]   Imported By: Marylou Mccoy 05/08/2010 15:15:28  _____________________________________________________________________  External Attachment:    Type:   Image     Comment:   External Document

## 2010-05-19 ENCOUNTER — Encounter (HOSPITAL_COMMUNITY): Payer: MEDICARE

## 2010-05-22 ENCOUNTER — Encounter (HOSPITAL_COMMUNITY): Payer: MEDICARE

## 2010-05-22 ENCOUNTER — Encounter (HOSPITAL_COMMUNITY): Payer: MEDICARE | Attending: Cardiovascular Disease

## 2010-05-22 DIAGNOSIS — I2510 Atherosclerotic heart disease of native coronary artery without angina pectoris: Secondary | ICD-10-CM | POA: Insufficient documentation

## 2010-05-22 DIAGNOSIS — I472 Ventricular tachycardia, unspecified: Secondary | ICD-10-CM | POA: Insufficient documentation

## 2010-05-22 DIAGNOSIS — J449 Chronic obstructive pulmonary disease, unspecified: Secondary | ICD-10-CM | POA: Insufficient documentation

## 2010-05-22 DIAGNOSIS — E785 Hyperlipidemia, unspecified: Secondary | ICD-10-CM | POA: Insufficient documentation

## 2010-05-22 DIAGNOSIS — Z87891 Personal history of nicotine dependence: Secondary | ICD-10-CM | POA: Insufficient documentation

## 2010-05-22 DIAGNOSIS — N289 Disorder of kidney and ureter, unspecified: Secondary | ICD-10-CM | POA: Insufficient documentation

## 2010-05-22 DIAGNOSIS — Z7982 Long term (current) use of aspirin: Secondary | ICD-10-CM | POA: Insufficient documentation

## 2010-05-22 DIAGNOSIS — I252 Old myocardial infarction: Secondary | ICD-10-CM | POA: Insufficient documentation

## 2010-05-22 DIAGNOSIS — I714 Abdominal aortic aneurysm, without rupture, unspecified: Secondary | ICD-10-CM | POA: Insufficient documentation

## 2010-05-22 DIAGNOSIS — Z79899 Other long term (current) drug therapy: Secondary | ICD-10-CM | POA: Insufficient documentation

## 2010-05-22 DIAGNOSIS — Z5189 Encounter for other specified aftercare: Secondary | ICD-10-CM | POA: Insufficient documentation

## 2010-05-22 DIAGNOSIS — E119 Type 2 diabetes mellitus without complications: Secondary | ICD-10-CM | POA: Insufficient documentation

## 2010-05-22 DIAGNOSIS — I4729 Other ventricular tachycardia: Secondary | ICD-10-CM | POA: Insufficient documentation

## 2010-05-22 DIAGNOSIS — I129 Hypertensive chronic kidney disease with stage 1 through stage 4 chronic kidney disease, or unspecified chronic kidney disease: Secondary | ICD-10-CM | POA: Insufficient documentation

## 2010-05-22 DIAGNOSIS — N189 Chronic kidney disease, unspecified: Secondary | ICD-10-CM | POA: Insufficient documentation

## 2010-05-22 DIAGNOSIS — J4489 Other specified chronic obstructive pulmonary disease: Secondary | ICD-10-CM | POA: Insufficient documentation

## 2010-05-22 DIAGNOSIS — Z951 Presence of aortocoronary bypass graft: Secondary | ICD-10-CM | POA: Insufficient documentation

## 2010-05-24 ENCOUNTER — Other Ambulatory Visit: Payer: Self-pay

## 2010-05-24 ENCOUNTER — Encounter (HOSPITAL_COMMUNITY): Payer: MEDICARE

## 2010-05-24 MED ORDER — GLUCOSE BLOOD VI STRP: ORAL_STRIP | Status: DC

## 2010-05-26 ENCOUNTER — Encounter (HOSPITAL_COMMUNITY): Payer: MEDICARE

## 2010-05-29 ENCOUNTER — Encounter (HOSPITAL_COMMUNITY): Payer: MEDICARE

## 2010-05-31 ENCOUNTER — Encounter (HOSPITAL_COMMUNITY): Payer: MEDICARE

## 2010-06-02 ENCOUNTER — Encounter (HOSPITAL_COMMUNITY): Payer: MEDICARE

## 2010-06-05 ENCOUNTER — Encounter (HOSPITAL_COMMUNITY): Payer: MEDICARE

## 2010-06-07 ENCOUNTER — Encounter (HOSPITAL_COMMUNITY): Payer: MEDICARE

## 2010-06-09 ENCOUNTER — Encounter (HOSPITAL_COMMUNITY): Payer: MEDICARE

## 2010-06-12 ENCOUNTER — Encounter (HOSPITAL_COMMUNITY): Payer: MEDICARE

## 2010-06-14 ENCOUNTER — Encounter (HOSPITAL_COMMUNITY): Payer: MEDICARE

## 2010-06-16 ENCOUNTER — Encounter (HOSPITAL_COMMUNITY): Payer: MEDICARE

## 2010-06-19 ENCOUNTER — Encounter (HOSPITAL_COMMUNITY): Payer: MEDICARE

## 2010-06-21 ENCOUNTER — Encounter (HOSPITAL_COMMUNITY): Payer: MEDICARE

## 2010-06-21 ENCOUNTER — Encounter (HOSPITAL_COMMUNITY): Payer: MEDICARE | Attending: Cardiovascular Disease

## 2010-06-21 DIAGNOSIS — E785 Hyperlipidemia, unspecified: Secondary | ICD-10-CM | POA: Insufficient documentation

## 2010-06-21 DIAGNOSIS — J4489 Other specified chronic obstructive pulmonary disease: Secondary | ICD-10-CM | POA: Insufficient documentation

## 2010-06-21 DIAGNOSIS — Z87891 Personal history of nicotine dependence: Secondary | ICD-10-CM | POA: Insufficient documentation

## 2010-06-21 DIAGNOSIS — Z5189 Encounter for other specified aftercare: Secondary | ICD-10-CM | POA: Insufficient documentation

## 2010-06-21 DIAGNOSIS — E119 Type 2 diabetes mellitus without complications: Secondary | ICD-10-CM | POA: Insufficient documentation

## 2010-06-21 DIAGNOSIS — I2510 Atherosclerotic heart disease of native coronary artery without angina pectoris: Secondary | ICD-10-CM | POA: Insufficient documentation

## 2010-06-21 DIAGNOSIS — I252 Old myocardial infarction: Secondary | ICD-10-CM | POA: Insufficient documentation

## 2010-06-21 DIAGNOSIS — Z951 Presence of aortocoronary bypass graft: Secondary | ICD-10-CM | POA: Insufficient documentation

## 2010-06-21 DIAGNOSIS — N189 Chronic kidney disease, unspecified: Secondary | ICD-10-CM | POA: Insufficient documentation

## 2010-06-21 DIAGNOSIS — I714 Abdominal aortic aneurysm, without rupture, unspecified: Secondary | ICD-10-CM | POA: Insufficient documentation

## 2010-06-21 DIAGNOSIS — Z79899 Other long term (current) drug therapy: Secondary | ICD-10-CM | POA: Insufficient documentation

## 2010-06-21 DIAGNOSIS — N289 Disorder of kidney and ureter, unspecified: Secondary | ICD-10-CM | POA: Insufficient documentation

## 2010-06-21 DIAGNOSIS — Z7982 Long term (current) use of aspirin: Secondary | ICD-10-CM | POA: Insufficient documentation

## 2010-06-21 DIAGNOSIS — I129 Hypertensive chronic kidney disease with stage 1 through stage 4 chronic kidney disease, or unspecified chronic kidney disease: Secondary | ICD-10-CM | POA: Insufficient documentation

## 2010-06-21 DIAGNOSIS — I472 Ventricular tachycardia, unspecified: Secondary | ICD-10-CM | POA: Insufficient documentation

## 2010-06-21 DIAGNOSIS — J449 Chronic obstructive pulmonary disease, unspecified: Secondary | ICD-10-CM | POA: Insufficient documentation

## 2010-06-21 DIAGNOSIS — I4729 Other ventricular tachycardia: Secondary | ICD-10-CM | POA: Insufficient documentation

## 2010-06-23 ENCOUNTER — Encounter (HOSPITAL_COMMUNITY): Payer: MEDICARE

## 2010-06-26 ENCOUNTER — Encounter (HOSPITAL_COMMUNITY): Payer: MEDICARE

## 2010-06-28 ENCOUNTER — Encounter (HOSPITAL_COMMUNITY): Payer: MEDICARE

## 2010-06-30 ENCOUNTER — Encounter (HOSPITAL_COMMUNITY): Payer: MEDICARE

## 2010-07-03 ENCOUNTER — Encounter (HOSPITAL_COMMUNITY): Payer: MEDICARE

## 2010-07-05 ENCOUNTER — Encounter (HOSPITAL_COMMUNITY): Payer: MEDICARE

## 2010-07-07 ENCOUNTER — Encounter (HOSPITAL_COMMUNITY): Payer: MEDICARE

## 2010-07-07 NOTE — Op Note (Signed)
Brian [name]. [facility]  Patient:    Brian [name], Brian [name]                      MRN: [id number] Proc. Date: 03/19/00 Adm. Date:  [id number] Attending:  Susa Day CC:         Two copies to Dr. Teressa Senter   Operative Report  PREOPERATIVE DIAGNOSIS:  Painful left wrist with ulnar sided ganglion and clinical evidence of both central and peripheral triangle fiber cartilage tear.  POSTOPERATIVE DIAGNOSIS:  Painful left wrist with ulnar sided ganglion and clinical evidence of both central and peripheral triangle fiber cartilage tear.  OPERATION: 1. Diagnostic arthroscopy, right wrist. 2. Arthroscopic debridement of degenerative central peripheral triangle    fiber cartilage tear. 3. Arthroscopic repair of peripheral triangle fiber cartilage tear with    0 Tycron utilizing Tuohy needle. 4. Excision of ulnar sided ganglion cyst.  SURGEON:  Katy Fitch. Sypher, Montez Hageman., M.D.  ASSISTANT:  Jonni Sanger, P.A.  ANESTHESIA:  Axillary block by the anesthesiologist.  ANESTHESIOLOGIST:  Burna Forts, M.D.  INDICATIONS:  Brian [name] is a 77 year old man who was referred by Dr. Alwyn Ren for evaluation of an ulnar sided ganglion and ulnar sided wrist pain. Clinical examination revealed ulnar neutral wrist with signs of a degenerative triangle fiber cartilage tear.  He had an ulnar sided ganglion between the ulna styloid and a piece of triquetral joint.  Initially recommended surgery approximately eight months ago; however, his ganglion involuted.  He postponed surgery until this time.  He had a recurrence of the ganglion and increasing ulnar sided wrist pain.  We recommended diagnostic arthroscopy followed by appropriate management of the ganglion.  DESCRIPTION OF PROCEDURE:  Brian [name] was brought to the operating room and placed in the supine position upon the operating table.  Following placement of axillary block in the holding area by Dr.  Jacklynn Bue, anesthesia was satisfactory in the right arm.  The arm was prepped with Betadine soap and solution and sterilely draped. Ancef 1 gram was administered as an IV prophylactic antibiotic.  The arm was exsanguinated with Esmarch bandage.  Arterial tourniquet was inflated to 240 mmHg.  The procedure commenced with introduction of the arthroscope through a standard 3-4 dorsal portal.  Diagnostic arthroscopy revealed intact hyaline intra-articular cartilages on the scaphoid, lunate, and triquetrum. The volar radial carpal and ulnar carpal ligaments were normal.  The scapholunate interosseous ligament had some fraying.  There was a very prominent dorsal synovial shelf extending across the dorsal aspect of the wrist from the ulnar scaphoid to the triquetrum.  This somewhat obscured view of the triangle fiber cartilage in the ulnar carpal joint.  There was a substantial central peripheral triangle fiber cartilage tear with the ulnar head visible through the tear and mild grade 1 and 2 chondromalacia of the ulnar head visible.  There was no corresponding chondromalacia on the ulnar aspect of the lunate.  There was a large peripheral triangle fiber cartilage tear dorsal to the prestyloid recess.  This extended to the capsule adjacent to the triquetral joint and is probably the origin of the ulnar sided ganglion.  The margins of the tear were obscured by reactive synovium.  The synovium was debrided with a resector followed by inspection of the peripheral tear.  This tear was amenable to repair with arthroscopic technique.  After debridement of the margins of the peripheral tear, the central tear was debrided to a stable rim with a  suction basket forceps and a suction shaver.  A Tuohy needle was used to place two mattress sutures of 0 Tycron that satisfactory obliterated the peripheral tear.  These were tied through an incision on the ulnar aspect of the wrist, distal to the ulnar  styloid which allowed visualization of the ganglion.  Remarkably after debridement of the peripheral tear, the ganglion had been drained of its contents.  The membrane of the ganglion was excised.  The sutures were tied over the capsule of the wrist joint taking care to avoid the dorsal ulnar sensory branch and a large dorsal vein.  The scope was then used to confirm satisfactory obliteration of the peripheral tear.  Photographic documentation of the tear and repair were completed with a digital camera.  The scope was then removed and the portal sites dressed with Xeroform.  The ulnar incision was then repaired with intradermal 3-0 Prolene and Steri-Strips.  The tourniquet was released and immediate capillary refill was noted in the fingers.  A voluminous gauze dressing was applied with sugar tong splint maintaining the forearm in supination and the wrist in neutral position. DD:  03/19/00 TD:  03/19/00 Job: [id number]

## 2010-07-07 NOTE — Assessment & Plan Note (Signed)
[facility]                        [place] OFFICE NOTE   NAME:Delauder, JABRE HEO                      MRN:          [id number]  DATE:05/24/2006                            DOB:          04-Nov-1931    Ralene Muskrat, date of birth 23-Mar-1931, was seen May 24, 2006;  he awoke April 3 and noted dried blood in his underwear.  He had  experienced no symptoms during the night.  He denies any rectal bleeding  or melena.  He had a negative colonoscopy in 1999 and in 2004, and would  have repeat scheduled in 2009.  He did have a perirectal abscess in 1999  with colon polyps history.  He was previously seen at [facility]  for ED;  there is no history of renal calculi or other bladder or kidney  problems.  Brother does have prostate cancer.   Weight is stable at 188, pulse was 60, respiratory rate 15, blood  pressure 110/60.  Genital exam was unremarkable.  Prostate is enlarged  without nodularity.  Hemoccult testing was negative.  There are no  perirectal abscesses or other lesions.   Dip urine was negative for blood.  PSA will be drawn with other labs  05/29/2006 at [facility].     Titus Dubin. Alwyn Ren, MD,FACP,FCCP  Electronically Signed    [facility]/MedQ  DD: 05/24/2006  DT: 05/24/2006  Job #: [id number]

## 2010-07-10 ENCOUNTER — Encounter (HOSPITAL_COMMUNITY): Payer: MEDICARE

## 2010-07-12 ENCOUNTER — Encounter (HOSPITAL_COMMUNITY): Payer: MEDICARE

## 2010-07-14 ENCOUNTER — Encounter (HOSPITAL_COMMUNITY): Payer: MEDICARE

## 2010-07-17 ENCOUNTER — Encounter (HOSPITAL_COMMUNITY): Payer: MEDICARE

## 2010-07-19 ENCOUNTER — Encounter (HOSPITAL_COMMUNITY): Payer: MEDICARE

## 2010-07-21 ENCOUNTER — Encounter (HOSPITAL_COMMUNITY): Payer: Medicare Other

## 2010-07-21 ENCOUNTER — Encounter (HOSPITAL_COMMUNITY): Payer: Medicare Other | Attending: Cardiovascular Disease

## 2010-07-21 DIAGNOSIS — I129 Hypertensive chronic kidney disease with stage 1 through stage 4 chronic kidney disease, or unspecified chronic kidney disease: Secondary | ICD-10-CM | POA: Insufficient documentation

## 2010-07-21 DIAGNOSIS — Z7982 Long term (current) use of aspirin: Secondary | ICD-10-CM | POA: Insufficient documentation

## 2010-07-21 DIAGNOSIS — Z79899 Other long term (current) drug therapy: Secondary | ICD-10-CM | POA: Insufficient documentation

## 2010-07-21 DIAGNOSIS — Z951 Presence of aortocoronary bypass graft: Secondary | ICD-10-CM | POA: Insufficient documentation

## 2010-07-21 DIAGNOSIS — N189 Chronic kidney disease, unspecified: Secondary | ICD-10-CM | POA: Insufficient documentation

## 2010-07-21 DIAGNOSIS — Z87891 Personal history of nicotine dependence: Secondary | ICD-10-CM | POA: Insufficient documentation

## 2010-07-21 DIAGNOSIS — I252 Old myocardial infarction: Secondary | ICD-10-CM | POA: Insufficient documentation

## 2010-07-21 DIAGNOSIS — I472 Ventricular tachycardia, unspecified: Secondary | ICD-10-CM | POA: Insufficient documentation

## 2010-07-21 DIAGNOSIS — I4729 Other ventricular tachycardia: Secondary | ICD-10-CM | POA: Insufficient documentation

## 2010-07-21 DIAGNOSIS — E785 Hyperlipidemia, unspecified: Secondary | ICD-10-CM | POA: Insufficient documentation

## 2010-07-21 DIAGNOSIS — J449 Chronic obstructive pulmonary disease, unspecified: Secondary | ICD-10-CM | POA: Insufficient documentation

## 2010-07-21 DIAGNOSIS — N289 Disorder of kidney and ureter, unspecified: Secondary | ICD-10-CM | POA: Insufficient documentation

## 2010-07-21 DIAGNOSIS — I2510 Atherosclerotic heart disease of native coronary artery without angina pectoris: Secondary | ICD-10-CM | POA: Insufficient documentation

## 2010-07-21 DIAGNOSIS — I714 Abdominal aortic aneurysm, without rupture, unspecified: Secondary | ICD-10-CM | POA: Insufficient documentation

## 2010-07-21 DIAGNOSIS — Z5189 Encounter for other specified aftercare: Secondary | ICD-10-CM | POA: Insufficient documentation

## 2010-07-21 DIAGNOSIS — E119 Type 2 diabetes mellitus without complications: Secondary | ICD-10-CM | POA: Insufficient documentation

## 2010-07-21 DIAGNOSIS — J4489 Other specified chronic obstructive pulmonary disease: Secondary | ICD-10-CM | POA: Insufficient documentation

## 2010-07-24 ENCOUNTER — Encounter: Payer: Self-pay | Admitting: Cardiovascular Disease

## 2010-07-25 ENCOUNTER — Ambulatory Visit: Payer: MEDICARE | Admitting: Internal Medicine

## 2010-07-27 ENCOUNTER — Encounter: Payer: Self-pay | Admitting: Internal Medicine

## 2010-07-27 ENCOUNTER — Ambulatory Visit (INDEPENDENT_AMBULATORY_CARE_PROVIDER_SITE_OTHER): Admitting: Internal Medicine

## 2010-07-27 DIAGNOSIS — E785 Hyperlipidemia, unspecified: Secondary | ICD-10-CM

## 2010-07-27 DIAGNOSIS — Z951 Presence of aortocoronary bypass graft: Secondary | ICD-10-CM

## 2010-07-27 DIAGNOSIS — E119 Type 2 diabetes mellitus without complications: Secondary | ICD-10-CM

## 2010-07-27 DIAGNOSIS — M109 Gout, unspecified: Secondary | ICD-10-CM

## 2010-07-27 LAB — HEMOGLOBIN A1C: Hgb A1c MFr Bld: 6.5 % (ref 4.6–6.5)

## 2010-07-27 LAB — HEPATIC FUNCTION PANEL
ALT: 12 U/L (ref 0–53)
AST: 18 U/L (ref 0–37)
Albumin: 4.2 g/dL (ref 3.5–5.2)

## 2010-07-27 LAB — CREATININE, SERUM: Creatinine, Ser: 1.1 mg/dL (ref 0.4–1.5)

## 2010-07-27 LAB — URIC ACID: Uric Acid, Serum: 5.9 mg/dL (ref 4.0–7.8)

## 2010-07-27 LAB — LIPID PANEL
HDL: 53.3 mg/dL (ref >39.00)
LDL Cholesterol: 100 mg/dL — ABNORMAL HIGH (ref 0–99)
VLDL: 14.2 mg/dL (ref 0.0–40.0)

## 2010-07-27 LAB — MICROALBUMIN / CREATININE URINE RATIO
Creatinine,U: 121.6 mg/dL
Microalb Creat Ratio: 0.8 mg/g (ref 0.0–30.0)

## 2010-07-27 LAB — TSH: TSH: 1.22 u[IU]/mL (ref 0.35–5.50)

## 2010-07-27 MED ORDER — PRAVASTATIN SODIUM 40 MG PO TABS: 40.0000 mg | ORAL_TABLET | Freq: Every day | ORAL | Status: DC

## 2010-07-27 MED ORDER — ALLOPURINOL 100 MG PO TABS: 100.0000 mg | ORAL_TABLET | Freq: Every day | ORAL | Status: DC

## 2010-07-27 NOTE — Patient Instructions (Signed)
Diabetes Monitor   The A1c test is used primarily to monitor the glucose control of diabetics over time. The goal of those with diabetes is to keep their blood glucose levels as close to normal as possible. This helps to minimize the complications caused by chronically elevated glucose levels, such as progressive damage to body organs like the kidneys, eyes, cardiovascular system, and nerves. The A1c test gives a picture of the average amount of glucose in the blood over the last few months. It can help a patient and his doctor know if the measures they are taking to control the patient's diabetes are successful or need to be adjusted.      NORMAL VALUES  Non diabetic adults: 5 %-6.1%  Good diabetic control: 6.2-6.4 %  Fair diabetic control: 6.5-7%  Poor diabetic control: greater than 7 % ( except with additional factors such as  advanced age; significant coronary or neurologic disease,etc). Check the A1c every 6 months if it is < 6.5%; every 4 months if  6.5% or higher. Goals for home glucose monitoring are : fasting  or morning glucose goal of  90-150. Two hours after any meal , goal = < 180, preferably < 150.

## 2010-07-27 NOTE — Progress Notes (Signed)
Subjective:    Patient ID: Brian [name], male    DOB: 03-May-1931, 77 y.o.   MRN: [id number]  HPI Diabetes status assessment: Fasting or morning glucose range:  88- 139 or average :  106  . Highest glucose 2 hours after any meal:  < 150. Hypoglycemia :  no .                                                     Excess thirst :no;  Excess hunger:  no ;  Excess urination:  no.                                  Lightheadedness with standing:  no. Chest pain:  no ; Palpitations :no ;  Pain in  calves with walking:  Minor RLE ? From donor vein  status  Non healing skin  ulcers or sores,especially over the feet:  no. Numbness or tingling or burning in feet : no .                                                                                                                                              Significant change in  Weight : no. Vision changes : no  .                                                                    Exercise : S/P Rehab . Nutrition/diet:  Heart healthy. Medication compliance : yes. Medication adverse  Effects:  no . Eye exam : due. Foot care : due.  A1c/ urine microalbumin monitor:  6.4% pre admission       Review of Systems no gout flares recently. Dr Aldean Ast seen 6/6 ; no active issue.     Objective:   Physical Exam Gen.: Healthy and well-nourished in appearance. Alert, appropriate and cooperative throughout exam. Neck: No deformities, masses, or tenderness noted.  Thyroid normal. Lungs: Normal respiratory effort; chest expands symmetrically. Lungs are clear to auscultation without rales, wheezes, or increased work of breathing. Heart: Slow rate and reg rhythm. Normal S1 and S2. No gallop, click, or rub. no murmur.  No clubbing, cyanosis, edema, or deformity noted. Range of motion  normal .Joints normal. Nail health  good. Vascular: Carotid, radial artery, dorsalis pedis and dorsalis posterior tibial pulses are full and equal. No bruits present.Pedal pulses  slightly reduced . Neurologic: Alert and oriented  x3. Deep tendon reflexes symmetrical and normal.  Light touch normal        Skin: Intact without suspicious lesions or rashes.No ischemic changes Lymph: No cervical, axillary, or inguinal lymphadenopathy present. Psych: Mood and affect are normal. Normally interactive                                                                                         Assessment & Plan:  #1 DM , ? Control #2 gout, quiescent #3 CAD   Plan: see Orders & recommendations

## 2010-07-31 ENCOUNTER — Other Ambulatory Visit: Payer: Self-pay

## 2010-07-31 MED ORDER — SIMVASTATIN 40 MG PO TABS: 40.0000 mg | ORAL_TABLET | Freq: Every day | ORAL | Status: DC

## 2010-08-10 ENCOUNTER — Ambulatory Visit: Admitting: Cardiovascular Disease

## 2010-08-24 DIAGNOSIS — Z0279 Encounter for issue of other medical certificate: Secondary | ICD-10-CM

## 2010-08-31 ENCOUNTER — Encounter: Payer: Self-pay | Admitting: Cardiovascular Disease

## 2010-09-05 ENCOUNTER — Encounter: Payer: Self-pay | Admitting: Cardiovascular Disease

## 2010-09-05 ENCOUNTER — Ambulatory Visit (INDEPENDENT_AMBULATORY_CARE_PROVIDER_SITE_OTHER): Payer: Medicare Other | Admitting: Cardiovascular Disease

## 2010-09-05 VITALS — BP 141/78 | HR 71 | Resp 16 | Ht 71.0 in | Wt 176.0 lb

## 2010-09-05 DIAGNOSIS — I2510 Atherosclerotic heart disease of native coronary artery without angina pectoris: Secondary | ICD-10-CM | POA: Insufficient documentation

## 2010-09-05 MED ORDER — METOPROLOL TARTRATE 25 MG PO TABS: 25.0000 mg | ORAL_TABLET | Freq: Two times a day (BID) | ORAL | Status: DC

## 2010-09-05 NOTE — Progress Notes (Signed)
History of Present Illness:77 yo AAM with h/o HTN, hyperlipidemia, DM who was admitted to [facility] January 2012 with chest pain and ruled in for an MI with cardiac enzymes. He underwent cardiac cath on 02/28/10 demonstrating triple vessel CAD. 3V CABG on 03/03/10 per Dr. Donata Clay.  He has been doing well. No chest pain, palpitations, dizziness, near syncope or syncope.  He has been taking all of his medications. His blood pressure has been well controlled at home. He does get fatigued and dyspneic when walking in the yard.   Primary care is Dr. Alwyn Ren. His last LDL in June 2012 was 100. Dr. Alwyn Ren has planned to change his statin. He will do this when his current Pravachol runs out.   Past Medical History  Diagnosis Date  . CAD (coronary artery disease) 03/03/10\  . Diabetes mellitus 2000    onset  . Gout   . ED (erectile dysfunction)   . Hyperlipidemia   . Colon polyps   . Macular degeneration   . AAA (abdominal aortic aneurysm)     Past Surgical History  Procedure Date  . Incision and drainage perirectal abscess   . Colonoscopy     neg 2009, due 2014  . Inguinal hernia repair   . Cataract left eye   . 3 v cabg     Current Outpatient Prescriptions  Medication Sig Dispense Refill  . allopurinol (ZYLOPRIM) 100 MG tablet Take 1 tablet (100 mg total) by mouth daily.  90 tablet  1  . Aspirin (ADULT ASPIRIN LOW STRENGTH) 81 MG EC tablet Take 81 mg by mouth daily.        . cholecalciferol (VITAMIN D) 1000 UNITS tablet Take 1,000 Units by mouth daily.        . enalapril (VASOTEC) 5 MG tablet Take 5 mg by mouth daily.        Marland Kitchen glucose blood (ACCU-CHEK COMPACT TEST DRUM) test strip Check blood sugar once daily  100 each  3  . loratadine (CLARITIN) 10 MG tablet Take 10 mg by mouth daily.        . metFORMIN (GLUMETZA) 500 MG (MOD) 24 hr tablet Take 500 mg by mouth 2 (two) times daily.        . pravastatin (PRAVACHOL) 40 MG tablet Take 1 tablet (40 mg total) by mouth daily. At  bedtime   90 tablet  1  . temazepam (RESTORIL) 7.5 MG capsule Take 7.5 mg by mouth. 1 at bedtime, changed by the [place]       . traMADol (ULTRAM) 50 MG tablet Take 50 mg by mouth every 6 (six) hours as needed.        . triamcinolone (KENALOG) 0.1 % cream Apply topically as needed.          Allergies  Allergen Reactions  . Meperidine Hcl   . Quinapril Hcl   . Zolpidem Tartrate     REACTION: Crazy dreams    History   Social History  . Marital Status: Widowed    Spouse Name: N/A    Number of Children: N/A  . Years of Education: N/A   Occupational History  . Not on file.   Social History Main Topics  . Smoking status: Former Games developer  . Smokeless tobacco: Not on file  . Alcohol Use: Yes     socially   . Drug Use: No  . Sexually Active: Not on file   Other Topics Concern  . Not on file   Social  History Narrative   Quit smoking in 1998. Retired Tax adviser. Regular exercise- no.     Family History  Problem Relation Age of Onset  . Heart failure Mother   . Heart attack Father   . Heart attack Sister 88  . Heart attack Brother 77  . Diabetes Brother     Review of Systems:  As stated in the HPI and otherwise negative.   BP 141/78  Pulse 71  Resp 16  Ht 5\' 11"  (1.803 m)  Wt 176 lb (79.833 kg)  BMI 24.55 kg/m2  Physical Examination: General: Well developed, well nourished, NAD HEENT: OP clear, mucus membranes moist SKIN: warm, dry. No rashes. Neuro: No focal deficits Musculoskeletal: Muscle strength 5/5 all ext Psychiatric: Mood and affect normal Neck: No JVD, no carotid bruits, no thyromegaly, no lymphadenopathy. Lungs:Clear bilaterally, no wheezes, rhonci, crackles Cardiovascular: Regular rate and rhythm. No murmurs, gallops or rubs. Abdomen:Soft. Bowel sounds present. Non-tender.  Extremities: No lower extremity edema. Pulses are 2 + in the bilateral DP/PT.

## 2010-09-05 NOTE — Patient Instructions (Signed)
Your physician recommends that you schedule a follow-up appointment in: 6 months  Your physician has requested that you have an echocardiogram. Echocardiography is a painless test that uses sound waves to create images of your heart. It provides your doctor with information about the size and shape of your heart and how well your heart's chambers and valves are working. This procedure takes approximately one hour. There are no restrictions for this procedure.  Your physician has recommended you make the following change in your medication: START LOPRESSOR 25 mg twice daily.

## 2010-09-05 NOTE — Assessment & Plan Note (Signed)
Stable. Will continue statin, ASA and start Lopressor 25 mg po BID. Will repeat Echo to assess LV function.

## 2010-09-15 ENCOUNTER — Other Ambulatory Visit: Payer: Self-pay | Admitting: *Deleted

## 2010-09-15 ENCOUNTER — Ambulatory Visit (HOSPITAL_COMMUNITY): Payer: Medicare Other | Attending: Cardiovascular Disease | Admitting: Radiology

## 2010-09-15 DIAGNOSIS — E785 Hyperlipidemia, unspecified: Secondary | ICD-10-CM | POA: Insufficient documentation

## 2010-09-15 DIAGNOSIS — I2510 Atherosclerotic heart disease of native coronary artery without angina pectoris: Secondary | ICD-10-CM

## 2010-09-15 DIAGNOSIS — I059 Rheumatic mitral valve disease, unspecified: Secondary | ICD-10-CM | POA: Insufficient documentation

## 2010-09-15 DIAGNOSIS — I079 Rheumatic tricuspid valve disease, unspecified: Secondary | ICD-10-CM | POA: Insufficient documentation

## 2010-09-15 MED ORDER — METOPROLOL TARTRATE 25 MG PO TABS: 25.0000 mg | ORAL_TABLET | Freq: Two times a day (BID) | ORAL | Status: DC

## 2010-09-15 NOTE — Telephone Encounter (Signed)
[facility]. Danielle Rankin

## 2010-09-27 ENCOUNTER — Encounter: Payer: Self-pay | Admitting: Internal Medicine

## 2010-10-25 ENCOUNTER — Telehealth: Payer: Self-pay | Admitting: Cardiovascular Disease

## 2010-10-25 NOTE — Telephone Encounter (Signed)
Spoke with Brian [name] and he would like to have cataract surgery on right eye in September by Dr. Dagoberto Ligas. Surgery not scheduled yet. Brian [name] wants to know if Dr. Clifton James feels he can proceed with surgery.  Brian [name] does not know if surgeon will require him to hold any medications prior to surgery. He will contact Dr. Vic Blackbird office to find out this information and call us back.  I told Brian [name] I would forward note to Dr. Clifton James for review.

## 2010-10-25 NOTE — Telephone Encounter (Signed)
Returning call back to nurse.  

## 2010-10-25 NOTE — Telephone Encounter (Signed)
Pt catarac surgery in October, wants to know if ok to proceed

## 2010-10-25 NOTE — Telephone Encounter (Signed)
Left message to call back  

## 2010-10-25 NOTE — Telephone Encounter (Signed)
OK to have surgery. Can we let him know? Thanks, chris

## 2010-10-26 ENCOUNTER — Telehealth: Payer: Self-pay | Admitting: Cardiovascular Disease

## 2010-10-26 NOTE — Telephone Encounter (Signed)
Pt. Aware Dr. Clifton James has said he is OK to have surgery.

## 2010-10-26 NOTE — Telephone Encounter (Signed)
He called eye doctor and they leave it up to cardiologist regarding d/c meds prior to surgery.  Please call him if any questions.

## 2010-10-26 NOTE — Telephone Encounter (Signed)
Left message to call back. See phone note started 10/25/10

## 2010-10-26 NOTE — Telephone Encounter (Signed)
Left message to call back  

## 2010-12-21 ENCOUNTER — Ambulatory Visit (INDEPENDENT_AMBULATORY_CARE_PROVIDER_SITE_OTHER): Payer: Medicare Other

## 2010-12-21 DIAGNOSIS — Z23 Encounter for immunization: Secondary | ICD-10-CM

## 2011-01-26 ENCOUNTER — Ambulatory Visit (INDEPENDENT_AMBULATORY_CARE_PROVIDER_SITE_OTHER): Payer: Medicare Other | Admitting: Internal Medicine

## 2011-01-26 VITALS — BP 126/60 | HR 73 | Temp 98.8°F | Wt 178.4 lb

## 2011-01-26 DIAGNOSIS — J209 Acute bronchitis, unspecified: Secondary | ICD-10-CM

## 2011-01-26 MED ORDER — AZITHROMYCIN 250 MG PO TABS: ORAL_TABLET | ORAL | Status: AC

## 2011-01-26 MED ORDER — HYDROCODONE-HOMATROPINE 5-1.5 MG/5ML PO SYRP: 5.0000 mL | ORAL_SOLUTION | Freq: Four times a day (QID) | ORAL | Status: DC | PRN

## 2011-01-26 NOTE — Patient Instructions (Signed)
Plain Mucinex for thick secretions ;force NON dairy fluids. Use a Neti pot daily as needed for sinus congestion  

## 2011-01-26 NOTE — Progress Notes (Signed)
  Subjective:    Patient ID: Brian [name], male    DOB: [birth year], 77 y.o.   MRN: [id number]  HPI Respiratory tract infection Onset/symptoms:12/4 as dry cough Exposures (illness/environmental/extrinsic):no Progression of symptoms:to intermittent white sputum Treatments/response:OTCs , ? "tussin". Flu shot taken 10/12 Present symptoms: Fever/chills/sweats:12/4 evening Frontal headache:yes Facial pain:no Nasal purulence:no but rhinitis Sore throat:no Dental pain:no Lymphadenopathy:no Wheezing/shortness of breath:some wheezing Associated extrinsic/allergic symptoms:itchy eyes/ sneezing:no Past medical history: Seasonal allergies: no/asthma:remotely diagnosed with asthma Smoking history:quit 2002          Review of Systems     Objective:   Physical Exam General appearance is of good health and nourishment; no acute distress or increased work of breathing is present.  No  lymphadenopathy about the head, neck, or axilla noted.   Eyes: No conjunctival inflammation or lid edema is present. Small pterygia present; vision normal  Ears:  External ear exam shows no significant lesions or deformities.  Otoscopic examination reveals clear canals, tympanic membranes are intact bilaterally without bulging, retraction, inflammation or discharge.  Nose:  External nasal examination shows no deformity or inflammation. Nasal mucosa are dry without lesions or exudates. No septal dislocation .No obstruction to airflow.   Oral exam: Dentures; lips and gums are healthy appearing.There is no oropharyngeal erythema or exudate noted.     Heart:  Normal rate and regular rhythm. S1 and S2 normal without gallop, murmur, click, rub or other extra sounds.   Lungs: no wheezes; minimal rhonchi present @ bases.No increased work of breathing.    Extremities:  No cyanosis, edema, or clubbing  noted    Skin: Warm & dry         Assessment & Plan:  #1 bronchitis without criteria for  rhinosinusitis  Plan: See orders and recommendations

## 2011-01-29 ENCOUNTER — Telehealth: Payer: Self-pay | Admitting: Internal Medicine

## 2011-01-29 DIAGNOSIS — J209 Acute bronchitis, unspecified: Secondary | ICD-10-CM

## 2011-01-30 MED ORDER — HYDROCODONE-HOMATROPINE 5-1.5 MG/5ML PO SYRP: 5.0000 mL | ORAL_SOLUTION | Freq: Four times a day (QID) | ORAL | Status: AC | PRN

## 2011-01-30 NOTE — Telephone Encounter (Signed)
Rx was sent, re-printed to re-send

## 2011-02-02 ENCOUNTER — Other Ambulatory Visit: Payer: Self-pay | Admitting: Internal Medicine

## 2011-02-02 DIAGNOSIS — E119 Type 2 diabetes mellitus without complications: Secondary | ICD-10-CM

## 2011-02-05 ENCOUNTER — Other Ambulatory Visit (INDEPENDENT_AMBULATORY_CARE_PROVIDER_SITE_OTHER): Payer: Medicare Other

## 2011-02-05 DIAGNOSIS — E119 Type 2 diabetes mellitus without complications: Secondary | ICD-10-CM

## 2011-02-05 DIAGNOSIS — Z0000 Encounter for general adult medical examination without abnormal findings: Secondary | ICD-10-CM

## 2011-02-05 LAB — HEPATIC FUNCTION PANEL
ALT: 11 U/L (ref 0–53)
AST: 15 U/L (ref 0–37)
Alkaline Phosphatase: 56 U/L (ref 39–117)
Total Bilirubin: 0.3 mg/dL (ref 0.3–1.2)

## 2011-02-05 LAB — LIPID PANEL: HDL: 43.4 mg/dL (ref >39.00)

## 2011-02-05 LAB — HEMOGLOBIN A1C: Hgb A1c MFr Bld: 6.2 % (ref 4.6–6.5)

## 2011-02-05 LAB — MICROALBUMIN / CREATININE URINE RATIO
Creatinine,U: 140 mg/dL
Microalb Creat Ratio: 2.2 mg/g (ref 0.0–30.0)
Microalb, Ur: 3.1 mg/dL — ABNORMAL HIGH (ref 0.0–1.9)

## 2011-02-07 ENCOUNTER — Encounter: Payer: Self-pay | Admitting: Internal Medicine

## 2011-02-07 ENCOUNTER — Ambulatory Visit (INDEPENDENT_AMBULATORY_CARE_PROVIDER_SITE_OTHER): Payer: Medicare Other | Admitting: Internal Medicine

## 2011-02-07 DIAGNOSIS — E119 Type 2 diabetes mellitus without complications: Secondary | ICD-10-CM

## 2011-02-07 DIAGNOSIS — R1013 Epigastric pain: Secondary | ICD-10-CM

## 2011-02-07 DIAGNOSIS — R1011 Right upper quadrant pain: Secondary | ICD-10-CM

## 2011-02-07 DIAGNOSIS — E785 Hyperlipidemia, unspecified: Secondary | ICD-10-CM

## 2011-02-07 LAB — CBC WITH DIFFERENTIAL/PLATELET
Basophils Absolute: 0 10*3/uL (ref 0.0–0.1)
Basophils Relative: 0.5 % (ref 0.0–3.0)
Eosinophils Absolute: 0.2 10*3/uL (ref 0.0–0.7)
Eosinophils Relative: 2.3 % (ref 0.0–5.0)
HCT: 37.7 % — ABNORMAL LOW (ref 39.0–52.0)
Hemoglobin: 12.6 g/dL — ABNORMAL LOW (ref 13.0–17.0)
Lymphocytes Relative: 31.7 % (ref 12.0–46.0)
Lymphs Abs: 2.2 10*3/uL (ref 0.7–4.0)
MCHC: 33.4 g/dL (ref 30.0–36.0)
MCV: 95.1 fl (ref 78.0–100.0)
Monocytes Absolute: 0.8 10*3/uL (ref 0.1–1.0)
Monocytes Relative: 11.1 % (ref 3.0–12.0)
Neutro Abs: 3.7 10*3/uL (ref 1.4–7.7)
Neutrophils Relative %: 54.4 % (ref 43.0–77.0)
Platelets: 350 10*3/uL (ref 150.0–400.0)
RBC: 3.97 Mil/uL — ABNORMAL LOW (ref 4.22–5.81)
RDW: 13.3 % (ref 11.5–14.6)
WBC: 6.8 10*3/uL (ref 4.5–10.5)

## 2011-02-07 LAB — ALT: ALT: 12 U/L (ref 0–53)

## 2011-02-07 LAB — AMYLASE: Amylase: 148 U/L — ABNORMAL HIGH (ref 27–131)

## 2011-02-07 LAB — AST: AST: 18 U/L (ref 0–37)

## 2011-02-07 MED ORDER — RANITIDINE HCL 150 MG PO CAPS: 150.0000 mg | ORAL_CAPSULE | Freq: Two times a day (BID) | ORAL | Status: DC

## 2011-02-07 NOTE — Assessment & Plan Note (Signed)
A1c is 6.2%; he has minimal microalbumin elevation of 3.1. His LDL goal would be less than 7 as long as  having no hypoglycemia.  I doubt the metformin is related to his GI symptoms; but it will be discontinued and A1c checked in 6-8 weeks. He will continue his nutrition and exercise.

## 2011-02-07 NOTE — Patient Instructions (Signed)
The triggers for reflux  include stress; the "aspirin family" ; alcohol; peppermint; and caffeine (coffee, tea, cola, and chocolate). The aspirin family would include aspirin and the nonsteroidal agents such as ibuprofen &  Naproxen. Tylenol would not cause reflux. If having GI symptoms ; food & drink should be avoided for @ least 2 hours before going to bed.Please complete stool cards  Please  schedule  Labs in 6-8 weeks  : A1c & urine microalbumin. PLEASE BRING THESE INSTRUCTIONS TO FOLLOW UP  LAB APPOINTMENT.This will guarantee correct labs are drawn, eliminating need for repeat blood sampling ( needle sticks ! ). Diagnoses /Codes: 250.00

## 2011-02-07 NOTE — Assessment & Plan Note (Signed)
Lipids are excellent; LDL is 66 and HDL is 43.4. No changes indicated

## 2011-02-07 NOTE — Progress Notes (Signed)
Subjective:    Patient ID: Brian [name], male    DOB: 1932/01/12, 77 y.o.   MRN: [id number]  HPI ABDOMINAL PAIN: Location: epigastric  Onset: 12/16   Radiation: no Severity: up to 4 Quality: indefinite  Duration: all day 12/16- present but resolving  Better with: no treament  Worse with: with direct pressure Symptoms Dysphagia: no Nausea/Vomiting: no  Diarrhea: no  Constipation: no  Melena/BRBPR: no  Hematemesis: no  Anorexia: no  Fever/Chills: no  Wt loss: no  EtOH use: no  NSAIDs/ASA: no  Past Surgeries: no endoscopy  Diabetes status assessment: Fasting or morning glucose  average :  101; low 62  . Highest glucose 2 hours after any meal:  < 147. Hypoglycemia :  No symptoms .                                                     Excess thirst; hunger; urination:  no.                                  Lightheadedness with standing:  no. Chest pain:  no ; Palpitations :no ;  Pain in  calves with walking:  no .                                                                                                                                 Non healing skin  ulcers or sores,especially over the feet:  no. Numbness or tingling or burning in feet : no .                                                                                                                                               Vision changes : no  .  Exercise : yes, walking 35 min 5X/ week . Nutrition/diet:  No plan. Medication compliance : yes, Metformin twice a day. Medication adverse  Effects:  no . Eye exam : last 9/11; appt 2/13. Foot care : no.  A1c/ urine microalbumin monitor:  6.2%   ; microalb 3.1.  He does have a past history of colon polyps. He has no history of gallbladder disease      Review of Systems     Objective:   Physical Exam Gen.: Thin but healthy  & well-nourished, appropriate and alert, weight Eyes: No  lid/conjunctival changes, small pterygia Neck: Normal range of motion, thyroid normal  Respiratory: No increased work of breathing or abnormal breath sounds Cardiac : Slow,regular rhythm, no extra heart sounds, gallop, murmur Abdomen: No organomegaly ,masses, bruits or aortic enlargement. Epigastric scars are well healed. He has slight tenderness in right upper quadrant Lymph: No lymphadenopathy of the neck or axilla Skin: No rashes, lesions, ulcers or ischemic changes Muscle skeletal: no nail changes Vasc:All pulses intact, no bruits present Neuro: Normal deep tendon reflexes, alert & oriented, sensation over feet intact Psych: judgment and insight, mood and affect normal         Assessment & Plan:   #1 epigastric vague discomfort with right upper quadrant tenderness. Hepatic enzymes were normal 12/17. He'll be placed on ranitidine. Stool cards, CBC and differential, repeat AST and ALT, amylase, and lipase will be corrected. Antireflux measures will be initiated

## 2011-02-09 ENCOUNTER — Other Ambulatory Visit: Payer: Self-pay | Admitting: Internal Medicine

## 2011-02-09 ENCOUNTER — Telehealth: Payer: Self-pay

## 2011-02-09 DIAGNOSIS — M109 Gout, unspecified: Secondary | ICD-10-CM

## 2011-02-09 MED ORDER — TEMAZEPAM 15 MG PO CAPS: 15.0000 mg | ORAL_CAPSULE | Freq: Every evening | ORAL | Status: DC | PRN

## 2011-02-09 MED ORDER — SIMVASTATIN 40 MG PO TABS: 40.0000 mg | ORAL_TABLET | Freq: Every day | ORAL | Status: DC

## 2011-02-09 MED ORDER — ALLOPURINOL 100 MG PO TABS: 100.0000 mg | ORAL_TABLET | Freq: Every day | ORAL | Status: DC

## 2011-02-09 NOTE — Telephone Encounter (Signed)
faxed to [facility] drug     KP

## 2011-02-09 NOTE — Telephone Encounter (Signed)
Anemia improved. Possible Mild pancreatitis based on amylase & lipase values. Avoid all alcohol ; repeat Amylase & Lipase 12/24 am. [place] of pancreas if symptoms not improving; to ER if symptoms progress significantly.   Spoke with patient and he stated he is doing much better and the pain has subsided. He agreed to come in on Monday to repeat the labs and understood if symptoms came back or worsened he would need to go to the ED. He also wanted me to make Hopp aware that he had an abdominal aneurysm.     KP

## 2011-02-09 NOTE — Telephone Encounter (Signed)
OK #[phone] 

## 2011-02-09 NOTE — Telephone Encounter (Signed)
Call from patient and he requested refills, he stated the 7.5 Temazepam is not working and he wants to increase to 10 mg. Please advise    KP

## 2011-02-12 ENCOUNTER — Other Ambulatory Visit (INDEPENDENT_AMBULATORY_CARE_PROVIDER_SITE_OTHER): Payer: Medicare Other

## 2011-02-12 DIAGNOSIS — R109 Unspecified abdominal pain: Secondary | ICD-10-CM

## 2011-02-14 ENCOUNTER — Telehealth: Payer: Self-pay

## 2011-02-14 NOTE — Telephone Encounter (Signed)
Message copied by Maurice Small on Wed Feb 14, 2011 10:23 AM ------      Message from: Pecola Lawless      Created: Mon Feb 12, 2011  3:06 PM       These values are now normal; any pancreatitis was mild and has resolved.

## 2011-02-14 NOTE — Telephone Encounter (Signed)
Left message on voicemail with lab results, informed patient copy to be mailed. Patient to call if any questions

## 2011-02-22 ENCOUNTER — Other Ambulatory Visit (INDEPENDENT_AMBULATORY_CARE_PROVIDER_SITE_OTHER): Payer: Medicare Other

## 2011-02-22 DIAGNOSIS — Z1211 Encounter for screening for malignant neoplasm of colon: Secondary | ICD-10-CM

## 2011-02-22 LAB — HEMOCCULT GUIAC POC 1CARD (OFFICE): Card #3 Fecal Occult Blood, POC: NEGATIVE

## 2011-02-23 ENCOUNTER — Other Ambulatory Visit: Payer: Self-pay | Admitting: Internal Medicine

## 2011-02-23 NOTE — Telephone Encounter (Signed)
Dr.Hopper please advise if this is was prescribed to be a long-term medication

## 2011-02-23 NOTE — Telephone Encounter (Signed)
Patient wants to know if he should get next refill of ranitidine and continue taking med, please contact patient

## 2011-02-23 NOTE — Telephone Encounter (Signed)
Reassess after 8 weeks as to whether  needed long-term.

## 2011-02-23 NOTE — Telephone Encounter (Signed)
Attempted to Inform patient; No answer, No answer machine.09:47am//SLS

## 2011-02-26 ENCOUNTER — Telehealth: Payer: Self-pay | Admitting: Internal Medicine

## 2011-02-26 NOTE — Telephone Encounter (Signed)
I would recommend taking the ranitidine twice a day for a total of 8 weeks and then as needed.

## 2011-02-26 NOTE — Telephone Encounter (Signed)
Dr.Hopper please advise 

## 2011-02-27 NOTE — Telephone Encounter (Signed)
Left a message for pt to return call 

## 2011-02-27 NOTE — Telephone Encounter (Signed)
Pt aware.

## 2011-02-28 NOTE — Telephone Encounter (Signed)
Left message on machine with Dr.Hopper's response, patient to call if questions or concern

## 2011-03-16 ENCOUNTER — Encounter: Payer: Self-pay | Admitting: Cardiovascular Disease

## 2011-03-16 ENCOUNTER — Ambulatory Visit (INDEPENDENT_AMBULATORY_CARE_PROVIDER_SITE_OTHER): Payer: Medicare Other | Admitting: Cardiovascular Disease

## 2011-03-16 VITALS — BP 152/71 | HR 48 | Ht 71.0 in | Wt 179.0 lb

## 2011-03-16 DIAGNOSIS — I2510 Atherosclerotic heart disease of native coronary artery without angina pectoris: Secondary | ICD-10-CM

## 2011-03-16 MED ORDER — METOPROLOL TARTRATE 25 MG PO TABS: 25.0000 mg | ORAL_TABLET | Freq: Two times a day (BID) | ORAL | Status: DC

## 2011-03-16 NOTE — Progress Notes (Signed)
History of Present Illness:78 yo AAM with h/o HTN, hyperlipidemia, DM who was admitted to [facility] January 2012 with chest pain and ruled in for an MI with cardiac enzymes. He underwent cardiac cath on 02/28/10 demonstrating triple vessel CAD. 3V CABG on 03/03/10 per Dr. Donata Clay. He is here today for follow up.   He has been doing well. No chest pain, palpitations, dizziness, near syncope or syncope. He has been taking all of his medications. His blood pressure has been well controlled at home in the 120-130 range systolically.   Primary care is Dr. Alwyn Ren. His last lipids 02/05/12: LDL was 66 and HDL was 43, followed by Dr. Alwyn Ren.     Past Medical History  Diagnosis Date  . CAD (coronary artery disease) 03/03/10\  . Diabetes mellitus 2000    onset  . Gout   . ED (erectile dysfunction)   . Hyperlipidemia   . Colon polyps   . Macular degeneration   . AAA (abdominal aortic aneurysm)     Past Surgical History  Procedure Date  . Incision and drainage perirectal abscess   . Colonoscopy     neg 2009, due 2014  . Inguinal hernia repair   . Cataract left eye   . 3 v cabg     Current Outpatient Prescriptions  Medication Sig Dispense Refill  . allopurinol (ZYLOPRIM) 100 MG tablet Take 1 tablet (100 mg total) by mouth daily.  90 tablet  1  . Aspirin (ADULT ASPIRIN LOW STRENGTH) 81 MG EC tablet Take 81 mg by mouth daily.        . cholecalciferol (VITAMIN D) 1000 UNITS tablet Take 1,000 Units by mouth daily.        . enalapril (VASOTEC) 5 MG tablet Take 5 mg by mouth daily.        Marland Kitchen glucose blood (ACCU-CHEK COMPACT TEST DRUM) test strip Check blood sugar once daily  100 each  3  . loratadine (CLARITIN) 10 MG tablet Take 10 mg by mouth daily.        . metoprolol tartrate (LOPRESSOR) 25 MG tablet Take 1 tablet (25 mg total) by mouth 2 (two) times daily.  180 tablet  3  . ranitidine (ZANTAC) 150 MG capsule Take 1 capsule (150 mg total) by mouth 2 (two) times daily.  60 capsule   2  . simvastatin (ZOCOR) 40 MG tablet Take 1 tablet (40 mg total) by mouth at bedtime.  90 tablet  0  . triamcinolone (KENALOG) 0.1 % cream Apply topically as needed.          Allergies  Allergen Reactions  . Meperidine Hcl   . Quinapril Hcl   . Zolpidem Tartrate     REACTION: Crazy dreams    History   Social History  . Marital Status: Widowed    Spouse Name: N/A    Number of Children: N/A  . Years of Education: N/A   Occupational History  . RETIRED    Social History Main Topics  . Smoking status: Former Smoker    Quit date: 02/20/1996  . Smokeless tobacco: Not on file  . Alcohol Use: Yes     socially   . Drug Use: No  . Sexually Active: Not on file   Other Topics Concern  . Not on file   Social History Narrative   Quit smoking in 1998. Retired Tax adviser. Regular exercise- no.     Family History  Problem Relation Age of Onset  .  Heart failure Mother   . Heart disease Mother     CHF  . Heart attack Father   . Heart disease Father 31    MI  . Heart attack Sister 30  . Heart disease Sister 51    MI  . Heart attack Brother 50  . Diabetes Brother   . Heart disease Brother 30    MI    Review of Systems:  As stated in the HPI and otherwise negative.   BP 159/68  Pulse 48  Ht 5\' 11"  (1.803 m)  Wt 179 lb (81.194 kg)  BMI 24.97 kg/m2  Physical Examination: General: Well developed, well nourished, NAD HEENT: OP clear, mucus membranes moist SKIN: warm, dry. No rashes. Neuro: No focal deficits Musculoskeletal: Muscle strength 5/5 all ext Psychiatric: Mood and affect normal Neck: No JVD, no carotid bruits, no thyromegaly, no lymphadenopathy. Lungs:Clear bilaterally, no wheezes, rhonci, crackles Cardiovascular: Regular rate and rhythm. No murmurs, gallops or rubs. Abdomen:Soft. Bowel sounds present. Non-tender.  Extremities: No lower extremity edema. Pulses are 2 + in the bilateral DP/PT.  EKG: Sinus bradycardia, 48 bpm.

## 2011-03-16 NOTE — Patient Instructions (Signed)
Your physician wants you to follow-up in: 12 months.  You will receive a reminder letter in the mail two months in advance. If you don't receive a letter, please call our office to schedule the follow-up appointment.  Your physician recommends that you continue on your current medications as directed. Please refer to the Current Medication list given to you today.   

## 2011-03-16 NOTE — Assessment & Plan Note (Signed)
Doing well s/p CABG in January 2012. He is on good medical therapy. He is tolerating all meds. BP is well controlled at home. Lipids are at goal. No changes today.

## 2011-03-21 ENCOUNTER — Other Ambulatory Visit: Payer: Self-pay | Admitting: Internal Medicine

## 2011-03-21 DIAGNOSIS — E119 Type 2 diabetes mellitus without complications: Secondary | ICD-10-CM

## 2011-03-22 ENCOUNTER — Other Ambulatory Visit (INDEPENDENT_AMBULATORY_CARE_PROVIDER_SITE_OTHER): Payer: Medicare Other

## 2011-03-22 DIAGNOSIS — E119 Type 2 diabetes mellitus without complications: Secondary | ICD-10-CM

## 2011-04-11 ENCOUNTER — Telehealth: Payer: Self-pay | Admitting: Cardiovascular Disease

## 2011-04-11 NOTE — Telephone Encounter (Signed)
New Msg: Pt calling wanting to know if he should continue to take metoprolol prior to pt cataract surgery scheduled for March 5th, 2013.   Lowest HR: 49 Highest HR: 66  Please return pt call to discuss further.

## 2011-04-11 NOTE — Telephone Encounter (Signed)
Spoke with pt and told him he should continue metoprolol

## 2011-05-01 ENCOUNTER — Encounter: Payer: Self-pay | Admitting: Internal Medicine

## 2011-05-01 ENCOUNTER — Ambulatory Visit (INDEPENDENT_AMBULATORY_CARE_PROVIDER_SITE_OTHER): Payer: Medicare Other | Admitting: Internal Medicine

## 2011-05-01 DIAGNOSIS — R001 Bradycardia, unspecified: Secondary | ICD-10-CM

## 2011-05-01 DIAGNOSIS — I498 Other specified cardiac arrhythmias: Secondary | ICD-10-CM

## 2011-05-01 DIAGNOSIS — E119 Type 2 diabetes mellitus without complications: Secondary | ICD-10-CM

## 2011-05-01 DIAGNOSIS — I2510 Atherosclerotic heart disease of native coronary artery without angina pectoris: Secondary | ICD-10-CM

## 2011-05-01 DIAGNOSIS — M109 Gout, unspecified: Secondary | ICD-10-CM

## 2011-05-01 DIAGNOSIS — R1013 Epigastric pain: Secondary | ICD-10-CM

## 2011-05-01 LAB — CBC WITH DIFFERENTIAL/PLATELET
Basophils Relative: 0.5 % (ref 0.0–3.0)
Eosinophils Absolute: 0.3 10*3/uL (ref 0.0–0.7)
Eosinophils Relative: 5.1 % — ABNORMAL HIGH (ref 0.0–5.0)
HCT: 39.8 % (ref 39.0–52.0)
Lymphs Abs: 2 10*3/uL (ref 0.7–4.0)
MCHC: 32.8 g/dL (ref 30.0–36.0)
MCV: 94 fl (ref 78.0–100.0)
Monocytes Absolute: 0.5 10*3/uL (ref 0.1–1.0)
Platelets: 163 10*3/uL (ref 150.0–400.0)
RBC: 4.23 Mil/uL (ref 4.22–5.81)
WBC: 5.3 10*3/uL (ref 4.5–10.5)

## 2011-05-01 LAB — URIC ACID: Uric Acid, Serum: 5.7 mg/dL (ref 4.0–7.8)

## 2011-05-01 MED ORDER — ALLOPURINOL 100 MG PO TABS: 100.0000 mg | ORAL_TABLET | Freq: Every day | ORAL | Status: DC

## 2011-05-01 MED ORDER — RANITIDINE HCL 150 MG PO TABS: 150.0000 mg | ORAL_TABLET | Freq: Two times a day (BID) | ORAL | Status: DC

## 2011-05-01 MED ORDER — RANITIDINE HCL 150 MG PO CAPS: 150.0000 mg | ORAL_CAPSULE | Freq: Two times a day (BID) | ORAL | Status: DC

## 2011-05-01 NOTE — Progress Notes (Signed)
  Subjective:    Patient ID: Brian [name], male    DOB: 07-07-1931, 78 y.o.   MRN: [id number]  HPI  #1 abdominal pain : Symptoms have  completely resolved with the ranitidine twice a day. He denies dysphagia, abdominal pain, rectal bleeding, or melena. He has never had an endoscopy #2 hypertension: He states his blood pressure averages 141/72 at home. He denies chest pain, palpitations, paroxysmal nocturnal dyspnea, claudication or edema. He does have exertional dyspnea. He is on no bronchodilators. O2 sats were 86%. He gives a past medical history of asthma diagnosed in 1976. There is no family history of lung disease. He smoked for 45 years up to one half packs per day.    Review of Systems He denies severe snoring or apnea; he does sleep alone. O2 sat was rechecked; it was 94%. Pulse was 44.He has noted the pulse rate as low as the mid 40s @ home.   Off metformin his fasting blood sugars average 122; glucoses 2 hours after a meal are less than 150. He denies hypoglycemia, polyuria, polydipsia, or polyphagia.     Objective:   Physical Exam Gen.: Healthy and well-nourished in appearance. Alert, appropriate and cooperative throughout exam. Eyes: No corneal or conjunctival inflammation noted. Nose: External nasal exam reveals no deformity or inflammation. Nasal mucosa are pink and moist. No lesions or exudates noted.   Mouth: Oral mucosa and oropharynx reveal no lesions or exudates. Complete dentures Neck: No deformities, masses, or tenderness noted.  No NVD Lungs: Normal respiratory effort; chest expands symmetrically. Lungs are clear to auscultation without rales, wheezes, or increased work of breathing.Decreased BS Heart: SLOW rate and slightly irregular  rhythm. Grade 1/2 over 6 flow murmur  Abdomen: Bowel sounds normal; abdomen soft and nontender. No masses, organomegaly or hernias noted.No HJR                                                                   Musculoskeletal/extremities:  No  cyanosis, edema, or deformity noted. Range of motion  normal .Tone & strength  normal.Joints normal. Nail health good; slight clubbing. Vascular: Carotid, radial artery pulses are full and equal.Decreased dorsalis pedis and  posterior tibial No bruits present. Neurologic: Alert and oriented x3.  Skin: Intact without suspicious lesions or rashes. Lymph: No cervical, axillary lymphadenopathy present. Psych: Mood and affect are normal. Normally interactive                                                                                         Assessment & Plan:  #1 abdominal pain resolved with ranitidine. This will be taken as needed in the future

## 2011-05-01 NOTE — Patient Instructions (Signed)
The triggers for dyspepsia or "heart burn"  include stress; the "aspirin family" ; alcohol; peppermint; and caffeine (coffee, tea, cola, and chocolate). The aspirin family would include aspirin and the nonsteroidal agents such as ibuprofen &  Naproxen. Tylenol would not cause reflux. If having dyspepsia ; food & drink should be avoided for @ least 2 hours before going to bed.  

## 2011-05-01 NOTE — Assessment & Plan Note (Signed)
By report glucoses are well controlled off metformin; A1c will be checked

## 2011-05-01 NOTE — Assessment & Plan Note (Signed)
He is on a beta blocker at low dose but is having significant bradycardia. Blood pressure is mildly elevated. Reassessment of the drug  regimen with his cardiologist will be pursued.

## 2011-05-09 ENCOUNTER — Ambulatory Visit: Payer: Medicare Other | Admitting: Physician Assistant

## 2011-05-10 ENCOUNTER — Encounter: Payer: Self-pay | Admitting: Physician Assistant

## 2011-05-10 ENCOUNTER — Ambulatory Visit (INDEPENDENT_AMBULATORY_CARE_PROVIDER_SITE_OTHER): Payer: Medicare Other | Admitting: Physician Assistant

## 2011-05-10 VITALS — BP 154/70 | HR 48 | Ht 71.0 in | Wt 183.0 lb

## 2011-05-10 DIAGNOSIS — I498 Other specified cardiac arrhythmias: Secondary | ICD-10-CM

## 2011-05-10 DIAGNOSIS — R001 Bradycardia, unspecified: Secondary | ICD-10-CM | POA: Insufficient documentation

## 2011-05-10 DIAGNOSIS — I2510 Atherosclerotic heart disease of native coronary artery without angina pectoris: Secondary | ICD-10-CM

## 2011-05-10 DIAGNOSIS — I714 Abdominal aortic aneurysm, without rupture, unspecified: Secondary | ICD-10-CM

## 2011-05-10 DIAGNOSIS — I10 Essential (primary) hypertension: Secondary | ICD-10-CM

## 2011-05-10 DIAGNOSIS — E1159 Type 2 diabetes mellitus with other circulatory complications: Secondary | ICD-10-CM | POA: Insufficient documentation

## 2011-05-10 MED ORDER — METOPROLOL SUCCINATE ER 25 MG PO TB24: ORAL_TABLET | ORAL | Status: DC

## 2011-05-10 MED ORDER — ENALAPRIL MALEATE 10 MG PO TABS
10.0000 mg | ORAL_TABLET | Freq: Every day | ORAL | Status: DC
Start: 2011-05-10 — End: 2012-02-26

## 2011-05-10 NOTE — Patient Instructions (Signed)
Your physician has recommended you make the following change in your medication:  Stop Metoprolol   Start Toprol XL 25mg  1/2 tablet daily   Increase Enalapril to 10 mg daily  Your physician recommends that you return for lab work in: 1 week for a BMET  Your physician recommends that you schedule a follow-up appointment in: 6 weeks with Tereso Newcomer PA. Same day as Dr. Clifton James is in the office.

## 2011-05-10 NOTE — Progress Notes (Signed)
[address] [place], [place]  [address] Phone: [phone] Fax:  [phone]  Date:  05/10/2011   Name:  Brian [name]       DOB:  09/21/1931 MRN:  [id number]  PCP:  Dr. Alwyn Ren  Primary Cardiologist:  Dr. Verne Carrow  Primary Electrophysiologist:  None    History of Present Illness: Brian [name] is a 78 y.o. male who presents for evaluation of bradycardia.  He has a history of CAD, status post CABG, AAA (4.0 cm by u/s in 11/2010 at [facility]), hypertension, hyperlipidemia, diabetes.  He suffered a NSTEMI in 02/2010.  LHC demonstrated three-vessel CAD with EF 50-55%.  CABG was performed by Dr. Donata [name] (LIMA-LAD, SVG-OM1, SVG-PDA).  Last echo in 08/2010: Mild LVH, EF 55%, grade 2 diastolic dysfunction, mild MR, mild LAE, mildly reduced RV function, mild RAE, PASP 42.  Last seen by Dr. Verne Carrow 02/2011 with plans for 1 year follow up.  Saw PCP recently.  BP was mildly elevated and HR noted to be low in the 50s.  His heart rate has been fairly stable over the last several mos.  He denies syncope or near syncope.  No lightheadedness or dizziness.  He feels tired but does not sleep well.  He denies orthopnea, PND or edema.  He notes dyspnea with more extreme activities since his MI without change.  No chest pain.    Past Medical History  Diagnosis Date  . CAD (coronary artery disease)     NSTEMI 02/2010;  s/p CABG 1/12 (L-LAD, SOM1, S-PDA); echo in 08/2010: Mild LVH, EF 55%, grade 2 diastolic dysfunction, mild MR, mild LAE, mildly reduced RV function, mild RAE, PASP 42  . Diabetes mellitus 2000    onset  . Gout   . ED (erectile dysfunction)   . Hyperlipidemia   . Colon polyps   . Macular degeneration   . AAA (abdominal aortic aneurysm)   . Asthma 1976  . HTN (hypertension)     Current Outpatient Prescriptions  Medication Sig Dispense Refill  . allopurinol (ZYLOPRIM) 100 MG tablet Take 1 tablet (100 mg total) by mouth daily.  90 tablet   1  . Aspirin (ADULT ASPIRIN LOW STRENGTH) 81 MG EC tablet Take 81 mg by mouth daily.        . Bromfenac Sodium (BROMDAY) 0.09 % SOLN Apply to eye. 1 drop in right eye daily      . cholecalciferol (VITAMIN D) 1000 UNITS tablet 2 ;000 units daily      . enalapril (VASOTEC) 5 MG tablet Take 5 mg by mouth daily.        Marland Kitchen gatifloxacin (ZYMAXID) 0.5 % SOLN Place 1 drop into the right eye 4 (four) times daily.      Marland Kitchen glucose blood (ACCU-CHEK COMPACT TEST DRUM) test strip Check blood sugar once daily  100 each  3  . loratadine (CLARITIN) 10 MG tablet Take 10 mg by mouth daily.        . metoprolol tartrate (LOPRESSOR) 25 MG tablet Take 1 tablet (25 mg total) by mouth 2 (two) times daily.  180 tablet  3  . ranitidine (ZANTAC) 150 MG tablet Take 1 tablet (150 mg total) by mouth 2 (two) times daily.  180 tablet  1  . simvastatin (ZOCOR) 40 MG tablet Take 1 tablet (40 mg total) by mouth at bedtime.  90 tablet  0  . triamcinolone (KENALOG) 0.1 % cream Apply topically as needed.  Allergies: Allergies  Allergen Reactions  . Meperidine Hcl   . Quinapril Hcl   . Zolpidem Tartrate     REACTION: Crazy dreams    History  Substance Use Topics  . Smoking status: Former Smoker    Quit date: 02/20/2003  . Smokeless tobacco: Not on file  . Alcohol Use: Yes     socially      ROS:  Please see the history of present illness.   All other systems reviewed and negative.   PHYSICAL EXAM: VS:  BP 154/70  Pulse 48  Ht 5\' 11"  (1.803 m)  Wt 183 lb (83.008 kg)  BMI 25.52 kg/m2 Well nourished, well developed, in no acute distress HEENT: normal Neck: no JVD Vasc: no carotid bruits Cardiac:  slow S1, S2; RRR; no murmur Lungs:  clear to auscultation bilaterally, no wheezing, rhonchi or rales Abd: soft, nontender, no hepatomegaly Ext: no edema Skin: warm and dry Neuro:  CNs 2-12 intact, no focal abnormalities noted  EKG:  Sinus bradycardia, heart rate 45, normal axis, nonspecific ST-T wave changes,  no change from prior tracing  ASSESSMENT AND PLAN:  1. CAD (coronary artery disease)  With MI occurring in 02/2010, would like to keep him some beta blocker through 2015.  He has been able to tolerate in the past.  Question if his fatigue may be related (ie mildly symptomatic from bradycardia).  Will decrease metoprolol as noted below.  Continue ASA and statin.  Follow up with Dr. Verne Carrow as planned.   2. Bradycardia  As noted above.  D/c metoprolol tartrate and change to metoprolol succinate 12.5 mg daily.  Follow up with me in 6 weeks to reassess.     3. Hypertension  Uncontrolled.  Increase Enalapril to 10 mg QD.  Check BMET in one week.  I can see him back in 6 weeks.    4. AAA  Followed closely by [facility] with serial ultrasounds.      SignedTereso Newcomer, PA-C  11:46 AM 05/10/2011

## 2011-05-16 ENCOUNTER — Telehealth: Payer: Self-pay | Admitting: Cardiovascular Disease

## 2011-05-16 NOTE — Telephone Encounter (Signed)
Please return call to patient at [phone]  Patient is having problem getting his Rx for enalapril (VASOTEC) 10 MG tablet Filled through [facility] as they have notes saying patient allergic to enalapril.  This is not the case as patient is currently taking Enalapril.  Please get in touch with [facility] [phone], as well as patient to advise.

## 2011-05-16 NOTE — Telephone Encounter (Signed)
Fax was completed yesterday and sent indicating pt has been on enalapril and tolerating it without problems for a year and that it was OK to fill. I spoke with [facility] rep today and gave them this information. They will fill prescription.  I called and let pt know this information.

## 2011-05-17 ENCOUNTER — Ambulatory Visit (INDEPENDENT_AMBULATORY_CARE_PROVIDER_SITE_OTHER): Payer: Medicare Other | Admitting: *Deleted

## 2011-05-17 DIAGNOSIS — I2510 Atherosclerotic heart disease of native coronary artery without angina pectoris: Secondary | ICD-10-CM

## 2011-05-17 DIAGNOSIS — E785 Hyperlipidemia, unspecified: Secondary | ICD-10-CM

## 2011-05-17 DIAGNOSIS — I714 Abdominal aortic aneurysm, without rupture: Secondary | ICD-10-CM

## 2011-05-17 LAB — BASIC METABOLIC PANEL
CO2: 27 mEq/L (ref 19–32)
Calcium: 9.1 mg/dL (ref 8.4–10.5)
Creatinine, Ser: 1.3 mg/dL (ref 0.4–1.5)
Glucose, Bld: 135 mg/dL — ABNORMAL HIGH (ref 70–99)

## 2011-05-18 ENCOUNTER — Telehealth: Payer: Self-pay | Admitting: *Deleted

## 2011-05-18 NOTE — Telephone Encounter (Signed)
Message copied by Tarri Fuller on Fri May 18, 2011 11:58 AM ------      Message from: [place], [place] T      Created: Thu May 17, 2011  1:22 PM       Please notify patient that the lab results are ok.      Tereso Newcomer, PA-C  1:21 PM 05/17/2011

## 2011-05-18 NOTE — Telephone Encounter (Signed)
pt notified of lab results today. Brian [name]  

## 2011-06-11 ENCOUNTER — Other Ambulatory Visit: Payer: Self-pay | Admitting: Internal Medicine

## 2011-06-21 ENCOUNTER — Encounter: Payer: Self-pay | Admitting: Physician Assistant

## 2011-06-21 ENCOUNTER — Ambulatory Visit (INDEPENDENT_AMBULATORY_CARE_PROVIDER_SITE_OTHER): Payer: Medicare Other | Admitting: Physician Assistant

## 2011-06-21 VITALS — BP 128/62 | HR 60 | Ht 71.0 in | Wt 183.0 lb

## 2011-06-21 DIAGNOSIS — R001 Bradycardia, unspecified: Secondary | ICD-10-CM

## 2011-06-21 DIAGNOSIS — I714 Abdominal aortic aneurysm, without rupture: Secondary | ICD-10-CM

## 2011-06-21 DIAGNOSIS — I10 Essential (primary) hypertension: Secondary | ICD-10-CM

## 2011-06-21 DIAGNOSIS — I2510 Atherosclerotic heart disease of native coronary artery without angina pectoris: Secondary | ICD-10-CM

## 2011-06-21 DIAGNOSIS — I498 Other specified cardiac arrhythmias: Secondary | ICD-10-CM

## 2011-06-21 NOTE — Patient Instructions (Signed)
WE WILL SEND YOU A LATER SOMETIME IN THE FALL TO MAKE AN APPOINTMENT TO SEE DR. MCALHANY IN JAN 2014  NO CHANGES WERE MADE TODAY

## 2011-06-21 NOTE — Progress Notes (Signed)
[address] [place], [place]  [address] Phone: [phone] Fax:  [phone]  Date:  06/21/2011   Name:  Brian [name]       DOB:  1931/10/31 MRN:  [id number]  PCP:  Dr. Alwyn Ren  Primary Cardiologist:  Dr. Verne Carrow  Primary Electrophysiologist:  None    History of Present Illness: Brian [name] is a 78 y.o. male who presents for follow up of bradycardia.  He has a history of CAD, status post CABG, AAA (4.0 cm by u/s in 11/2010 at [facility]), hypertension, hyperlipidemia, diabetes.  He suffered a NSTEMI in 02/2010.  LHC demonstrated three-vessel CAD with EF 50-55%.  CABG was performed by Dr. Donata Clay (LIMA-LAD, SVG-OM1, SVG-PDA).  Last echo in 08/2010: Mild LVH, EF 55%, grade 2 diastolic dysfunction, mild MR, mild LAE, mildly reduced RV function, mild RAE, PASP 42.  Last seen by Dr. Verne Carrow 02/2011 with plans for 1 year follow up.  I saw him 3/21 for bradycardia.  Question if he was mildly symptomatic.  I cut his metoprolol down to 12.5 mg QD.  I increased his enalapril for BP.  Follow up renal fxn and K+ ok.  BP and HR look better today.  He feels much better.  Notes dyspnea with more extreme activities, but this is improved.  Otherwise, denies chest pain, syncope, orthopnea, PND, edema.    Past Medical History  Diagnosis Date  . CAD (coronary artery disease)     NSTEMI 02/2010;  s/p CABG 1/12 (L-LAD, SOM1, S-PDA); echo in 08/2010: Mild LVH, EF 55%, grade 2 diastolic dysfunction, mild MR, mild LAE, mildly reduced RV function, mild RAE, PASP 42  . Diabetes mellitus 2000    onset  . Gout   . ED (erectile dysfunction)   . Hyperlipidemia   . Colon polyps   . Macular degeneration   . AAA (abdominal aortic aneurysm)   . Asthma 1976  . HTN (hypertension)     Current Outpatient Prescriptions  Medication Sig Dispense Refill  . allopurinol (ZYLOPRIM) 100 MG tablet Take 1 tablet (100 mg total) by mouth daily.  90 tablet  1  . Aspirin (ADULT  ASPIRIN LOW STRENGTH) 81 MG EC tablet Take 81 mg by mouth daily.        . cholecalciferol (VITAMIN D) 1000 UNITS tablet 2 ;000 units daily      . enalapril (VASOTEC) 10 MG tablet Take 1 tablet (10 mg total) by mouth daily.  90 tablet  3  . glucose blood (ACCU-CHEK COMPACT TEST DRUM) test strip Check blood sugar once daily  100 each  3  . loratadine (CLARITIN) 10 MG tablet Take 10 mg by mouth daily.        . metoprolol succinate (TOPROL-XL) 25 MG 24 hr tablet 1/2 tablet daily  45 tablet  3  . ranitidine (ZANTAC) 150 MG tablet Take 1 tablet (150 mg total) by mouth 2 (two) times daily.  180 tablet  1  . simvastatin (ZOCOR) 40 MG tablet TAKE 1 TABLET BY MOUTH AT BEDTIME  90 tablet  1  . temazepam (RESTORIL) 7.5 MG capsule Take 7.5 mg by mouth at bedtime as needed.      . triamcinolone (KENALOG) 0.1 % cream Apply topically as needed.        Marland Kitchen DISCONTD: temazepam (RESTORIL) 15 MG capsule Take 1 capsule (15 mg total) by mouth at bedtime as needed for sleep.  30 capsule  0    Allergies: Allergies  Allergen Reactions  . Meperidine Hcl   . Zolpidem Tartrate     REACTION: Crazy dreams    History  Substance Use Topics  . Smoking status: Former Smoker    Quit date: 02/20/2003  . Smokeless tobacco: Never Used  . Alcohol Use: Yes     socially      PHYSICAL EXAM: VS:  BP 128/62  Pulse 60  Ht 5\' 11"  (1.803 m)  Wt 183 lb (83.008 kg)  BMI 25.52 kg/m2 Well nourished, well developed, in no acute distress HEENT: normal Neck: no JVD Cardiac:  normal S1, S2; RRR; no murmur Lungs:  clear to auscultation bilaterally, no wheezing, rhonchi or rales Abd: soft, nontender, no hepatomegaly Ext: no edema Skin: warm and dry Neuro:  CNs 2-12 intact, no focal abnormalities noted  EKG:  Sinus bradycardia, heart rate 57, normal axis, nonspecific ST-T wave changes, PAC, no change from prior tracing  ASSESSMENT AND PLAN:  1. Bradycardia Much improved.  Continue current Rx.    2. HTN Controlled.   Continue current therapy.   3. CAD No angina.  Continue current Rx.  Follow up with Dr. Verne Carrow in 02/2012.  4. [facility]   Signed, Tereso Newcomer, PA-C  10:33 AM 06/21/2011

## 2011-07-12 ENCOUNTER — Encounter: Payer: Self-pay | Admitting: Internal Medicine

## 2011-07-12 ENCOUNTER — Ambulatory Visit (INDEPENDENT_AMBULATORY_CARE_PROVIDER_SITE_OTHER): Payer: Medicare Other | Admitting: Internal Medicine

## 2011-07-12 VITALS — BP 116/60 | HR 74 | Temp 98.0°F | Wt 182.6 lb

## 2011-07-12 DIAGNOSIS — S8992XA Unspecified injury of left lower leg, initial encounter: Secondary | ICD-10-CM

## 2011-07-12 DIAGNOSIS — M25569 Pain in unspecified knee: Secondary | ICD-10-CM

## 2011-07-12 MED ORDER — CELECOXIB 200 MG PO CAPS: 200.0000 mg | ORAL_CAPSULE | Freq: Two times a day (BID) | ORAL | Status: AC

## 2011-07-12 NOTE — Progress Notes (Signed)
  Subjective:    Patient ID: Brian [name], male    DOB: 05/11/1931, 78 y.o.   MRN: [id number]  HPI  One week ago he developed acute burning pain in the left inguinal area after he attempted to push his riding lawnmower away from electrical equipment up an incline.  He denies feeling or hearing a "snap" or "crack"  Since that time he's had constant pain in this area up to a 6. It does radiate to the upper left thigh both posteriorly and anteriorly.  It is described as a burning discomfort. Heat topically has been of some benefit.  Significantly Demerol caused angioedema remotely    Review of Systems he has had no nocturnal pain, melena, or rectal bleeding. There's been no associated fever, chills, or sweats. He denies dysuria, hematuria, or incontinence.  He's had no numbness and tingling in this area.     Objective:   Physical Exam he is thin but appears well-nourished. Appears younger than stated age.  Abdomen is nontender to palpation.  He limps with gait on the left leg. He is able to heel and toe walk without difficulty.  He is able to lie flat and sit up without help. Straight leg raising is negative  He describes pain in the left lateral thigh with range of motion of the hip. There is no pain to percussion over the left hip  There is tenderness to palpation in the left inguinal area. I cannot appreciate a direct or inguinal hernia.  Varices are noted in the left scrotum.        Assessment & Plan:  #1 left inguinal and thigh pain; clinically muscular/tendinous injury is suggested rather than herniation or hip fracture  Plan: He'll be given samples of Celebrex short-term. Referral will be made to physical therapy.

## 2011-07-12 NOTE — Patient Instructions (Addendum)
.  Share results with Physical Therapy. Soak in hot  bath twice a day

## 2011-07-24 ENCOUNTER — Ambulatory Visit: Payer: Medicare Other | Attending: Internal Medicine

## 2011-07-24 DIAGNOSIS — M25559 Pain in unspecified hip: Secondary | ICD-10-CM | POA: Insufficient documentation

## 2011-07-24 DIAGNOSIS — IMO0001 Reserved for inherently not codable concepts without codable children: Secondary | ICD-10-CM | POA: Insufficient documentation

## 2011-07-24 DIAGNOSIS — M25659 Stiffness of unspecified hip, not elsewhere classified: Secondary | ICD-10-CM | POA: Insufficient documentation

## 2011-07-24 DIAGNOSIS — R262 Difficulty in walking, not elsewhere classified: Secondary | ICD-10-CM | POA: Insufficient documentation

## 2011-07-27 ENCOUNTER — Ambulatory Visit: Payer: Medicare Other

## 2011-08-13 IMAGING — CR DG CHEST 2V
2 series · 2 of 2 positions shown · non-contrast
Comparison: 03/08/2010

CLINICAL DATA: Coronary atherosclerosis.

CHEST - 2 VIEW

[view not recorded (1 of 2)]
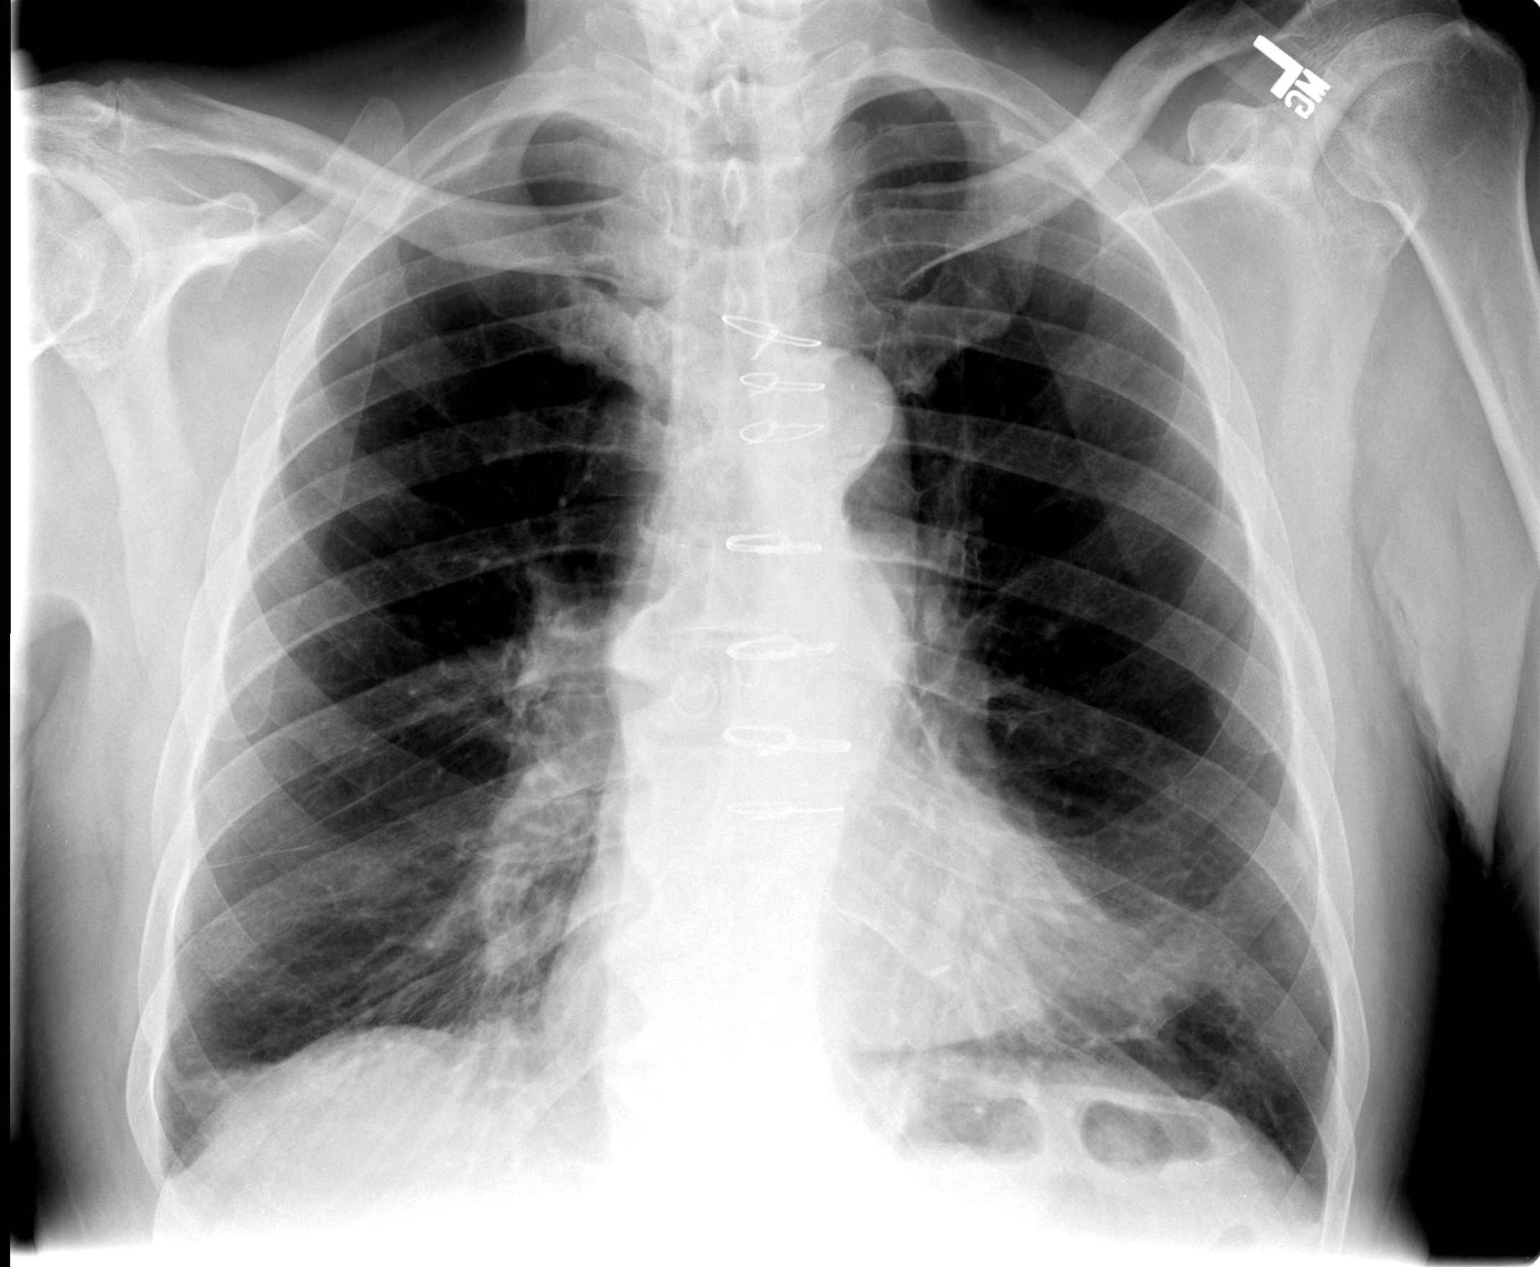

[view not recorded (2 of 2)]
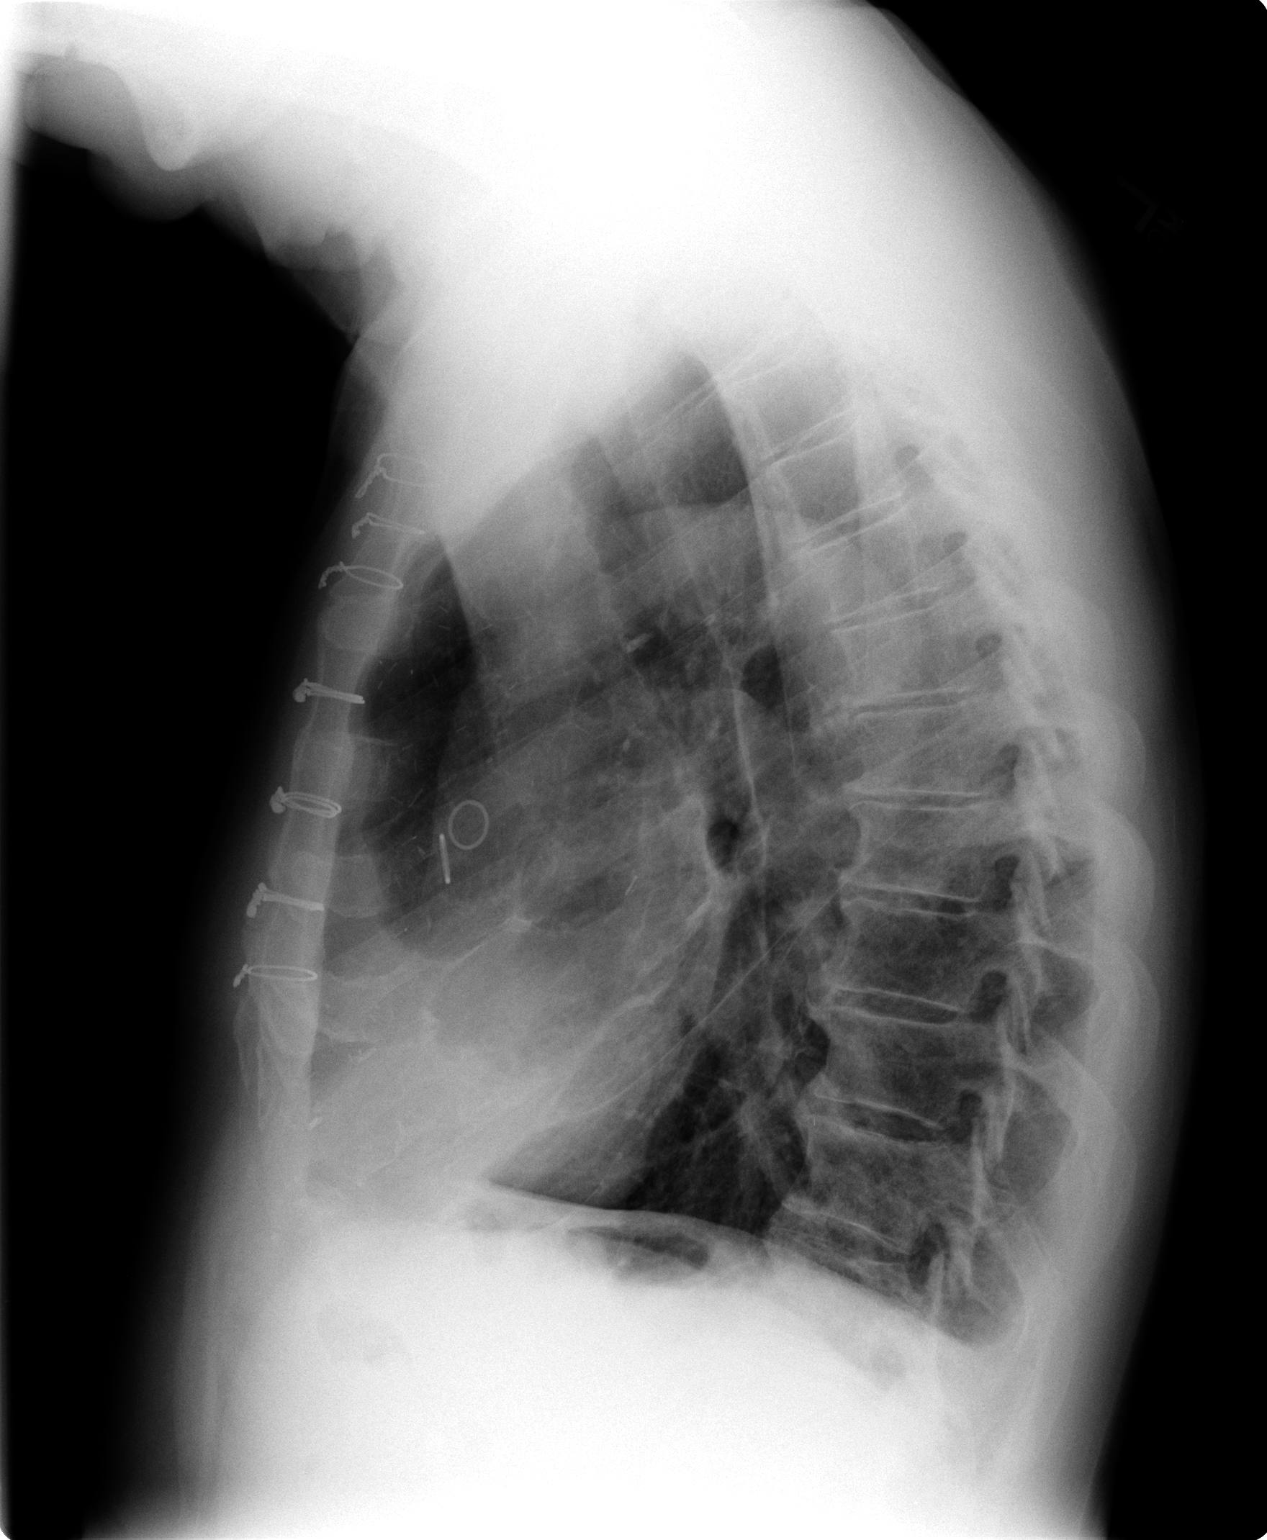

[2 of 2 positions shown; findings below may reference images not displayed]

FINDINGS: Changes of median sternotomy for CABG.  Heart size is
normal.  Thoracic aorta mildly tortuous.  Pulmonary vascularity is
normal.  The lungs are clear.  Right costophrenic angle minimally
blunted.  Left costophrenic angle is clear; previously identified
left pleural effusion has completely resolved.  Degenerative
changes of the thoracic spine.  No acute bony abnormality.
IMPRESSION: Minimal blunting of the right costophrenic angle may reflect a
tiny residual pleural fluid or pleural thickening.

## 2011-08-18 ENCOUNTER — Other Ambulatory Visit: Payer: Self-pay | Admitting: Internal Medicine

## 2011-10-23 ENCOUNTER — Encounter: Payer: Self-pay | Admitting: Internal Medicine

## 2011-10-23 ENCOUNTER — Ambulatory Visit (INDEPENDENT_AMBULATORY_CARE_PROVIDER_SITE_OTHER): Payer: Medicare Other | Admitting: Internal Medicine

## 2011-10-23 VITALS — BP 130/82 | HR 62 | Wt 180.2 lb

## 2011-10-23 DIAGNOSIS — I10 Essential (primary) hypertension: Secondary | ICD-10-CM

## 2011-10-23 DIAGNOSIS — M109 Gout, unspecified: Secondary | ICD-10-CM

## 2011-10-23 DIAGNOSIS — E785 Hyperlipidemia, unspecified: Secondary | ICD-10-CM

## 2011-10-23 DIAGNOSIS — E119 Type 2 diabetes mellitus without complications: Secondary | ICD-10-CM

## 2011-10-23 LAB — HEPATIC FUNCTION PANEL
Albumin: 4.1 g/dL (ref 3.5–5.2)
Alkaline Phosphatase: 54 U/L (ref 39–117)
Total Protein: 7.5 g/dL (ref 6.0–8.3)

## 2011-10-23 LAB — BASIC METABOLIC PANEL
BUN: 26 mg/dL — ABNORMAL HIGH (ref 6–23)
CO2: 26 mEq/L (ref 19–32)
Calcium: 9.3 mg/dL (ref 8.4–10.5)
Creatinine, Ser: 1.5 mg/dL (ref 0.4–1.5)
GFR: 58.7 mL/min — ABNORMAL LOW (ref >60.00)
Glucose, Bld: 106 mg/dL — ABNORMAL HIGH (ref 70–99)
Sodium: 138 mEq/L (ref 135–145)

## 2011-10-23 LAB — LIPID PANEL
Cholesterol: 136 mg/dL (ref 0–200)
HDL: 50.5 mg/dL (ref >39.00)
Triglycerides: 91 mg/dL (ref 0.0–149.0)

## 2011-10-23 LAB — HEMOGLOBIN A1C: Hgb A1c MFr Bld: 6.3 % (ref 4.6–6.5)

## 2011-10-23 LAB — URIC ACID: Uric Acid, Serum: 6 mg/dL (ref 4.0–7.8)

## 2011-10-23 LAB — MICROALBUMIN / CREATININE URINE RATIO: Microalb Creat Ratio: 0.8 mg/g (ref 0.0–30.0)

## 2011-10-23 NOTE — Assessment & Plan Note (Addendum)
A1c and urine microalbumin will be checked. He is on diet and exercise alone for diabetes

## 2011-10-23 NOTE — Patient Instructions (Addendum)
Blood Pressure Goal  Ideally is an AVERAGE < 135/85. This AVERAGE should be calculated from @ least 5-7 BP readings taken @ different times of day on different days of week. You should not respond to isolated BP readings , but rather the AVERAGE for that week.   If you have  activated My Chart; the results can be released to you as soon as they populate from the lab.

## 2011-10-23 NOTE — Assessment & Plan Note (Signed)
Lipid and hepatic function will be checked. LDL goal is less than 70

## 2011-10-23 NOTE — Assessment & Plan Note (Signed)
Focus will be on average rather than extreme range for blood pressure. Goals discussed; renal function will be checked

## 2011-10-23 NOTE — Progress Notes (Signed)
Subjective:    Patient ID: Brian [name], male    DOB: 02/26/1931, 78 y.o.   MRN: [id number]  HPI HYPERTENSION: Disease Monitoring: Blood pressure range-112/72-157/77 Chest pain, palpitations-no       Dyspnea- yes with yardwork Medications: Compliance- Cardiology increased Enalapril Lightheadedness,Syncope- no    Edema- no  DIABETES: Disease Monitoring: A1c 6.4% in 3/13 Blood Sugar ranges-average FBS 117; 2 hrs post high =162 Polyuria/phagia/dipsia- no but nocturia X 3       Visual problems-no; last Ophth exam 3/13; no retinopathy Medications: Compliance- on diet & exercise alone  Hypoglycemic symptoms- no  HYPERLIPIDEMIA: Disease Monitoring: See symptoms for Hypertension Medications: Compliance- yes Abd pain, bowel changes- no   Muscle aches- no      Review of Systems For the last month he describes rhinitis with clear drainage despite taking loratadine as needed. He denies associated itchy/watery eyes or sneezing. He also has no angioedema, cough, or wheezing. He is not experiencing nasal congestion/obstruction; nasal purulence; facial pain; anosmia; fatigue; fever; headache; halitosis; earache and dental pain.   For several months he describes pain in the right thumb and first 2 fingers as well as the 5 toes of the right foot. This is instantaneous and not associated with any particular position or activity. He denies associated numbness or tingling. He denies weakness in extremities or incontinence of urine or stool.     Objective:   Physical Exam Gen.:  well-nourished in appearance. Alert, appropriate and cooperative throughout exam.Appears younger than stated age . Eyes: No corneal or conjunctival inflammation noted.  Extraocular motion intact.  Ears: wax bilaterally Nose: External nasal exam reveals no deformity or inflammation. Nasal mucosa are pink and moist. No lesions or exudates noted.  Mouth: Oral mucosa and oropharynx reveal no lesions or exudates.  Dentures. Neck: No deformities, masses, or tenderness noted.  Thyroid normal.ROM essentially normal Lungs: Normal respiratory effort; chest expands symmetrically. Lungs are clear to auscultation without rales, wheezes, or increased work of breathing. Heart: Normal rate and rhythm. Normal S1 and S2. No gallop, click, or rub. S4 w/o  Murmur. Occasional premature Abdomen: Bowel sounds normal; abdomen soft and nontender. No masses, organomegaly or hernias noted.AAA not palpable  Musculoskeletal/extremities: No deformity or scoliosis noted of  the thoracic or lumbar spine. No clubbing, cyanosis, edema, or deformity noted. Range of motion  normal .Tone & strength  normal.Joints normal. Nail health  good. Negative straight leg raising bilaterally. Pes planus Vascular: Carotid, radial artery, dorsalis pedis and  posterior tibial pulses are full and equal. No bruits present. Neurologic: Alert and oriented x3. Deep tendon reflexes symmetrical but decreased @ knees. Tinel sign negative. Light touch normal over feet          Skin: Intact without suspicious lesions or rashes. Lymph: No cervical, axillary lymphadenopathy present. Psych: Mood and affect are normal. Normally interactive                                                                                         Assessment & Plan:  #1 see problem list with assessments and recommendations  #2 paroxysmal pain in C- 6 distribution of the right  hand & the R  toes. He'll be asked to keep a diary concerning the frequency and any potential triggers for the symptoms. Further evaluation will be based on those observations. If the foot symptoms persist or progress; podiatry consult would be indicated

## 2011-10-23 NOTE — Assessment & Plan Note (Signed)
This to symptoms do not sound like gout; but uric acid will be checked

## 2011-10-26 ENCOUNTER — Other Ambulatory Visit: Payer: Self-pay | Admitting: Internal Medicine

## 2011-11-28 ENCOUNTER — Other Ambulatory Visit: Payer: Self-pay | Admitting: Internal Medicine

## 2011-11-28 ENCOUNTER — Telehealth: Payer: Self-pay | Admitting: Internal Medicine

## 2011-11-28 DIAGNOSIS — I714 Abdominal aortic aneurysm, without rupture: Secondary | ICD-10-CM

## 2011-11-28 NOTE — Telephone Encounter (Signed)
Spoke with patient, patient states he has an aneurysm in his stomach. Patient states Dr.Hopper is aware of this. [facility] stated [facility] and he needs [place] to refer him to a specialist.   Special Instruction: Patient request AM appointment

## 2011-11-28 NOTE — Telephone Encounter (Signed)
Pt called and stated that he needs a referral for his stomach, he would like you to call him.

## 2011-11-29 ENCOUNTER — Other Ambulatory Visit: Payer: Self-pay | Admitting: Cardiology

## 2011-11-29 DIAGNOSIS — I714 Abdominal aortic aneurysm, without rupture: Secondary | ICD-10-CM

## 2011-12-03 ENCOUNTER — Encounter (INDEPENDENT_AMBULATORY_CARE_PROVIDER_SITE_OTHER): Payer: Medicare Other

## 2011-12-03 DIAGNOSIS — I714 Abdominal aortic aneurysm, without rupture: Secondary | ICD-10-CM

## 2011-12-05 ENCOUNTER — Ambulatory Visit (INDEPENDENT_AMBULATORY_CARE_PROVIDER_SITE_OTHER): Payer: Medicare Other | Admitting: *Deleted

## 2011-12-05 DIAGNOSIS — Z23 Encounter for immunization: Secondary | ICD-10-CM

## 2011-12-07 ENCOUNTER — Telehealth: Payer: Self-pay | Admitting: Internal Medicine

## 2011-12-07 ENCOUNTER — Other Ambulatory Visit: Payer: Self-pay | Admitting: Internal Medicine

## 2011-12-07 DIAGNOSIS — I714 Abdominal aortic aneurysm, without rupture: Secondary | ICD-10-CM

## 2011-12-07 NOTE — Telephone Encounter (Signed)
I asked if he had a preference but to date have received no notice. I'll make generic referral

## 2011-12-07 NOTE — Telephone Encounter (Signed)
Patient calling to check the status of his referral for his abdominal aneurysm to a surgeon.  Patient states he has been 'my charting' with Dr. Alwyn Ren about this, and that on 12-03-11, was told he would be referred to a Cardiovascular Surgeon.  There is no referral in the system.  Please advise.

## 2011-12-07 NOTE — Telephone Encounter (Signed)
Patient's referral and all pertinent information has been sent to [facility] for the AAA.  I have called & made patient aware of all, he understands.

## 2011-12-10 NOTE — Telephone Encounter (Addendum)
Per return fax from [facility], this patient should be referred to [facility], not their office.  I have completed [facility]'s form, and all info & faxed to their office.  They will contact the patient to schedule him for an appointment.  Per my call to patient, he has already received a call from [facility] to schedule him for an appointment, but patient is reluctant, because he states "Dr. Alwyn Ren specifically indicated I see a Cardiovascular surgeon.  Dr. Alwyn Ren, please advise.    Note:  Patient gone to a funeral this morning, will not be back home until after 1:00pm today.

## 2011-12-27 ENCOUNTER — Other Ambulatory Visit: Payer: Self-pay | Admitting: Internal Medicine

## 2011-12-28 NOTE — Telephone Encounter (Signed)
Rx sent.    MW 

## 2011-12-31 ENCOUNTER — Encounter: Payer: Medicare Other | Admitting: Surgery

## 2011-12-31 ENCOUNTER — Encounter: Payer: Self-pay | Admitting: Vascular Surgery

## 2012-01-01 ENCOUNTER — Ambulatory Visit (INDEPENDENT_AMBULATORY_CARE_PROVIDER_SITE_OTHER): Payer: Medicare Other | Admitting: Vascular Surgery

## 2012-01-01 ENCOUNTER — Encounter: Payer: Self-pay | Admitting: Vascular Surgery

## 2012-01-01 VITALS — BP 162/67 | HR 59 | Resp 16 | Ht 71.0 in | Wt 191.0 lb

## 2012-01-01 DIAGNOSIS — I714 Abdominal aortic aneurysm, without rupture: Secondary | ICD-10-CM

## 2012-01-01 NOTE — Progress Notes (Signed)
Subjective:     Patient ID: Brian [name], male   DOB: [birth year], 78 y.o.   MRN: [id number]  HPI this 78 year old male was referred by Dr. Lona Kettle for evaluation of abdominal aortic aneurysm. Patient was found to have an aneurysm at the [facility] in [place] and 20 in. Aneurysm has slowly enlarged and recently ultrasound performed 12/03/2011 revealed the aneurysm to be 4.2 x 3.8 cm in maximum diameter. Patient has had no abdominal or back symptoms. He does have a history of coronary artery bypass grafting by Dr. Donata Clay January 2012 and has done well since that time.  Past Medical History  Diagnosis Date  . CAD (coronary artery disease)     NSTEMI 02/2010;  s/p CABG 1/12 (L-LAD, SOM1, S-PDA); echo in 08/2010: Mild LVH, EF 55%, grade 2 diastolic dysfunction, mild MR, mild LAE, mildly reduced RV function, mild RAE, PASP 42  . Diabetes mellitus 2000    adult onset  . Gout   . ED (erectile dysfunction)   . Hyperlipidemia   . Colon polyps   . Macular degeneration   . AAA (abdominal aortic aneurysm)   . Asthma 1976  . HTN (hypertension)   . COPD (chronic obstructive pulmonary disease)   . Myocardial infarction 1 -13-2012    History  Substance Use Topics  . Smoking status: Former Smoker    Types: Cigarettes    Quit date: 02/20/2003  . Smokeless tobacco: Never Used  . Alcohol Use: 3.6 oz/week    3 Glasses of wine, 3 Shots of liquor per week     Comment: socially     Family History  Problem Relation Age of Onset  . Heart failure Mother   . Heart disease Mother     CHF  . Heart attack Father   . Heart disease Father 35    MI  . Heart attack Sister 38  . Heart disease Sister 7    MI  . Diabetes Sister   . Hyperlipidemia Sister   . Heart attack Brother 9  . Diabetes Brother   . Heart disease Brother 44    MI  . Cancer Brother   . Deep vein thrombosis Daughter   . Other Son     varicose veins    Allergies  Allergen Reactions  . Meperidine Hcl   . Zolpidem  Tartrate     REACTION: Crazy dreams    Current outpatient prescriptions:ACCU-CHEK COMPACT STRIPS test strip, TEST BLOOD SUGAR ONCE DAILY, Disp: 100 each, Rfl: 1;  allopurinol (ZYLOPRIM) 100 MG tablet, TAKE 1 TABLET BY MOUTH DAILY, Disp: 90 tablet, Rfl: 0;  Aspirin (ADULT ASPIRIN LOW STRENGTH) 81 MG EC tablet, Take 81 mg by mouth daily.  , Disp: , Rfl: ;  cholecalciferol (VITAMIN D) 1000 UNITS tablet, 2 ;000 units daily, Disp: , Rfl:  enalapril (VASOTEC) 10 MG tablet, Take 1 tablet (10 mg total) by mouth daily., Disp: 90 tablet, Rfl: 3;  loratadine (CLARITIN) 10 MG tablet, Take 10 mg by mouth daily.  , Disp: , Rfl: ;  metoprolol succinate (TOPROL-XL) 25 MG 24 hr tablet, 1/2 tablet daily, Disp: 45 tablet, Rfl: 3;  ranitidine (ZANTAC) 150 MG tablet, TAKE 1 TABLET BY MOUTH 2 TIMES DAILY, Disp: 180 tablet, Rfl: 2 simvastatin (ZOCOR) 40 MG tablet, TAKE 1 TABLET BY MOUTH AT BEDTIME, Disp: 90 tablet, Rfl: 1;  temazepam (RESTORIL) 7.5 MG capsule, Take 7.5 mg by mouth at bedtime as needed., Disp: , Rfl: ;  triamcinolone (KENALOG) 0.1 %  cream, Apply topically as needed.  , Disp: , Rfl:   BP 162/67  Pulse 59  Resp 16  Ht 5\' 11"  (1.803 m)  Wt 191 lb (86.637 kg)  BMI 26.64 kg/m2  SpO2 98%  Body mass index is 26.64 kg/(m^2).         Review of Systems denies chest pain or dyspnea on exertion. Is able to ambulate for 30 minutes 5 times a week. Denies hemoptysis, lateralizing weakness, amaurosis fugax, syncope. Other systems negative and complete review of systems     Objective:   Physical Exam blood pressure 162/6 MR rate 59 respirations 16 Gen.-alert and oriented x3 in no apparent distress HEENT normal for age Lungs no rhonchi or wheezing Cardiovascular regular rhythm no murmurs carotid pulses 3+ palpable no bruits audible Abdomen soft nontender -4 cm pulsatile mass in mid epigastrium-nontender  Musculoskeletal free of  major deformities Skin clear -no rashes Neurologic normal Lower extremities  3+ femoral and 2+ posterior tibial pulses palpable bilaterally with no edema  Today I reviewed the report of the duplex scan performed at [facility] care on 12/03/2011       Assessment:     #1-4.2 cm infrarenal AAA the duplex scan #2-coronary artery disease-status post coronary artery bypass grafting January 2012-doing well #3 hypertension #4 diabetes mellitus type 2    Plan:     Patient to return in one year with CT angiogram to see if size has enlarged and also to see if patient would be stent graft candidate if that becomes indicated in the future. No indication to repair aneurysm at this time Patient educated regarding symptoms of rupture

## 2012-01-02 NOTE — Addendum Note (Signed)
Addended by: Sharee Pimple on: 01/02/2012 09:09 AM   Modules accepted: Orders

## 2012-02-01 ENCOUNTER — Other Ambulatory Visit: Payer: Self-pay | Admitting: Internal Medicine

## 2012-02-20 HISTORY — PX: COLONOSCOPY: SHX174

## 2012-02-26 ENCOUNTER — Ambulatory Visit (INDEPENDENT_AMBULATORY_CARE_PROVIDER_SITE_OTHER): Payer: Medicare Other | Admitting: Cardiovascular Disease

## 2012-02-26 ENCOUNTER — Encounter: Payer: Self-pay | Admitting: Cardiovascular Disease

## 2012-02-26 VITALS — BP 142/72 | HR 67 | Ht 71.0 in | Wt 189.0 lb

## 2012-02-26 DIAGNOSIS — I2510 Atherosclerotic heart disease of native coronary artery without angina pectoris: Secondary | ICD-10-CM

## 2012-02-26 MED ORDER — ENALAPRIL MALEATE 10 MG PO TABS: 10.0000 mg | ORAL_TABLET | Freq: Every day | ORAL | Status: DC

## 2012-02-26 MED ORDER — METOPROLOL SUCCINATE ER 25 MG PO TB24: ORAL_TABLET | ORAL | Status: DC

## 2012-02-26 NOTE — Progress Notes (Signed)
History of Present Illness: 79 yo AAM with h/o HTN, hyperlipidemia, DM and CAD who was admitted to [facility] January 2012 with chest pain and ruled in for an MI with cardiac enzymes. He underwent cardiac cath on 02/28/10 demonstrating triple vessel CAD. 3V CABG on 03/03/10 per Dr. Donata Clay. He is here today for follow up. AAA followed in [facility].   He has been doing well. No chest pain, palpitations, dizziness, near syncope or syncope. He has been taking all of his medications. His blood pressure has been well controlled at home in the 130-140 range systolically.   Primary Care Physician: Dr. Alwyn Ren  Last Lipid Profile:Lipid Panel     Component Value Date/Time   CHOL 136 10/23/2011 0849   TRIG 91.0 10/23/2011 0849   HDL 50.50 10/23/2011 0849   CHOLHDL 3 10/23/2011 0849   VLDL 18.2 10/23/2011 0849   LDLCALC 67 10/23/2011 0849     Past Medical History  Diagnosis Date  . CAD (coronary artery disease)     NSTEMI 02/2010;  s/p CABG 1/12 (L-LAD, SOM1, S-PDA); echo in 08/2010: Mild LVH, EF 55%, grade 2 diastolic dysfunction, mild MR, mild LAE, mildly reduced RV function, mild RAE, PASP 42  . Diabetes mellitus 2000    adult onset  . Gout   . ED (erectile dysfunction)   . Hyperlipidemia   . Colon polyps   . Macular degeneration   . AAA (abdominal aortic aneurysm)   . Asthma 1976  . HTN (hypertension)   . COPD (chronic obstructive pulmonary disease)   . Myocardial infarction 1 -13-2012    Past Surgical History  Procedure Date  . Incision and drainage perirectal abscess   . Colonoscopy     neg 2009, due 2014  . Inguinal hernia repair   . Cataract left eye   . 3 vessel cabg 03/03/2010  . Coronary artery bypass graft 2012    X3    Current Outpatient Prescriptions  Medication Sig Dispense Refill  . ACCU-CHEK COMPACT STRIPS test strip TEST BLOOD SUGAR ONCE DAILY  100 each  1  . allopurinol (ZYLOPRIM) 100 MG tablet TAKE 1 TABLET BY MOUTH DAILY  90 tablet  0  . Aspirin (ADULT ASPIRIN  LOW STRENGTH) 81 MG EC tablet Take 81 mg by mouth daily.        . cholecalciferol (VITAMIN D) 1000 UNITS tablet 2 ;000 units daily      . enalapril (VASOTEC) 10 MG tablet Take 1 tablet (10 mg total) by mouth daily.  90 tablet  3  . metoprolol succinate (TOPROL-XL) 25 MG 24 hr tablet 1/2 tablet daily  45 tablet  3  . ranitidine (ZANTAC) 150 MG tablet TAKE 1 TABLET BY MOUTH 2 TIMES DAILY  180 tablet  2  . simvastatin (ZOCOR) 40 MG tablet TAKE 1 TABLET BY MOUTH AT BEDTIME  90 tablet  2  . temazepam (RESTORIL) 7.5 MG capsule Take 7.5 mg by mouth at bedtime as needed.      . triamcinolone (KENALOG) 0.1 % cream Apply topically as needed.          Allergies  Allergen Reactions  . Meperidine Hcl   . Zolpidem Tartrate     REACTION: Crazy dreams    History   Social History  . Marital Status: Widowed    Spouse Name: N/A    Number of Children: N/A  . Years of Education: N/A   Occupational History  . RETIRED    Social History Main  Topics  . Smoking status: Former Smoker    Types: Cigarettes    Quit date: 02/20/2003  . Smokeless tobacco: Never Used  . Alcohol Use: 3.6 oz/week    3 Glasses of wine, 3 Shots of liquor per week     Comment: socially   . Drug Use: No  . Sexually Active: Not on file   Other Topics Concern  . Not on file   Social History Narrative   Quit smoking in 1998. Retired Tax adviser. Regular exercise- no.     Family History  Problem Relation Age of Onset  . Heart failure Mother   . Heart disease Mother     CHF  . Heart attack Father   . Heart disease Father 28    MI  . Heart attack Sister 13  . Heart disease Sister 5    MI  . Diabetes Sister   . Hyperlipidemia Sister   . Heart attack Brother 13  . Diabetes Brother   . Heart disease Brother 30    MI  . Cancer Brother   . Deep vein thrombosis Daughter   . Other Son     varicose veins    Review of Systems:  As stated in the HPI and otherwise negative.   BP 142/72  Pulse 67  Ht 5\' 11"  (1.803 m)   Wt 189 lb (85.73 kg)  BMI 26.36 kg/m2  Physical Examination: General: Well developed, well nourished, NAD HEENT: OP clear, mucus membranes moist SKIN: warm, dry. No rashes. Neuro: No focal deficits Musculoskeletal: Muscle strength 5/5 all ext Psychiatric: Mood and affect normal Neck: No JVD, no carotid bruits, no thyromegaly, no lymphadenopathy. Lungs:Clear bilaterally, no wheezes, rhonci, crackles Cardiovascular: Regular rate and rhythm. No murmurs, gallops or rubs. Abdomen:Soft. Bowel sounds present. Non-tender.  Extremities: No lower extremity edema. Pulses are 2 + in the bilateral DP/PT.  Echo July 2012:  Left ventricle: The cavity size was normal. Wall thickness was increased in a pattern of mild LVH. Septal bounce consistent with prior cardiac surgery. Systolic function was normal. The estimated ejection fraction was 55%. Wall motion was normal; there were no regional wall motion abnormalities. Features are consistent with a pseudonormal left ventricular filling pattern, with concomitant abnormal relaxation and increased filling pressure (grade 2 diastolic dysfunction). E/medial e' > 15 suggests LV end diastolic pressure at least 20 mmHg. Septal thickness:13mm (ED, 2D). - Aortic valve: There was no stenosis. - Mitral valve: Mildly calcified annulus. Mild regurgitation. - Left atrium: The atrium was mildly dilated. - Right ventricle: The cavity size was normal. Systolic function was mildly reduced. - Right atrium: The atrium was mildly dilated. - Tricuspid valve: Peak RV-RA gradient: 32mm Hg (S). - Pulmonary arteries: PA peak pressure: 42mm Hg (S). - Systemic veins: IVC measured 1.8 cm with some respirophasic variation, suggesting RA pressure 10 mmHg. Impressions:  - Normal LV size with mild LV hypertrophy. Septal bounce consistent with prior cardiac surgery. EF 55%. Moderate diastolic dysfunction with evidence for elevated LV filling pressure. Normal RV size with  mildly decreased systolic function. Mild pulmonary hypertension. Mild MR.     Assessment and Plan:   1. CAD: Doing well s/p CABG in January 2012. He is on good medical therapy. He is tolerating all meds. BP is well controlled at home. Lipids are at goal. No changes today.

## 2012-02-26 NOTE — Patient Instructions (Addendum)
Your physician wants you to follow-up in:  12 months.  You will receive a reminder letter in the mail two months in advance. If you don't receive a letter, please call our office to schedule the follow-up appointment.   

## 2012-02-29 ENCOUNTER — Other Ambulatory Visit: Payer: Self-pay | Admitting: Internal Medicine

## 2012-03-06 ENCOUNTER — Other Ambulatory Visit: Payer: Self-pay | Admitting: Physician Assistant

## 2012-04-22 ENCOUNTER — Ambulatory Visit (INDEPENDENT_AMBULATORY_CARE_PROVIDER_SITE_OTHER): Payer: Medicare Other | Admitting: Internal Medicine

## 2012-04-22 ENCOUNTER — Encounter: Payer: Self-pay | Admitting: Internal Medicine

## 2012-04-22 VITALS — BP 132/78 | HR 51 | Wt 189.0 lb

## 2012-04-22 DIAGNOSIS — I714 Abdominal aortic aneurysm, without rupture, unspecified: Secondary | ICD-10-CM

## 2012-04-22 DIAGNOSIS — E785 Hyperlipidemia, unspecified: Secondary | ICD-10-CM

## 2012-04-22 DIAGNOSIS — I2510 Atherosclerotic heart disease of native coronary artery without angina pectoris: Secondary | ICD-10-CM

## 2012-04-22 DIAGNOSIS — E119 Type 2 diabetes mellitus without complications: Secondary | ICD-10-CM

## 2012-04-22 DIAGNOSIS — I10 Essential (primary) hypertension: Secondary | ICD-10-CM

## 2012-04-22 MED ORDER — ENALAPRIL MALEATE 10 MG PO TABS: ORAL_TABLET | ORAL | Status: DC

## 2012-04-22 NOTE — Patient Instructions (Addendum)
Minimal Blood Pressure Goal= AVERAGE <  135/85 due to AAA. This AVERAGE should be calculated from @ least 5-7 BP readings taken @ different times of day on different days of week. You should not respond to isolated BP readings , but rather the AVERAGE for that week .Please bring your  blood pressure cuff to office visits to verify that it is reliable.It  can also be checked against the blood pressure device at the pharmacy. Finger or wrist cuffs are not dependable; an arm cuff is. Review and correct the record as indicated. Please share record with all medical staff seen.

## 2012-04-22 NOTE — Assessment & Plan Note (Signed)
Copies of January labs from the [place] will be obtained to enter into the electronic medical record for continuity of care

## 2012-04-22 NOTE — Addendum Note (Signed)
Addended byMarga Melnick F on: 04/22/2012 10:53 AM   Modules accepted: Orders

## 2012-04-22 NOTE — Assessment & Plan Note (Signed)
A1c is excellent at 6.5% with no hypoglycemia. Minimal A1c goal would be 8% in view of his comorbidities

## 2012-04-22 NOTE — Assessment & Plan Note (Addendum)
Average 143/63; goal = < 135/85.  HCTZ is not an option because of his history of gout.  Increasing the beta blocker  is problematic because of his bradycardia.  Spironolactone addition would require close renal function monitor because of his ACE inhibitor.  At this time it  will be recommended he increase his Vasotec to one and one half pills daily.

## 2012-04-22 NOTE — Progress Notes (Signed)
Subjective:    Patient ID: Brian [name], male    DOB: 11/14/1931, 79 y.o.   MRN: [id number]  HPI The patient is here for followup of diabetes , hyperlipidemia, and hypertension.  The most recent A1c here  10/23/11 was 6.3 % , which correlates to an average sugar of 135, and long-term risk of 26 %. At [facility] it was 6.5% in January. Fasting blood sugar   average 121; low 84 w/o symptoms .  Highest two-hour postprandial glucose is <  151 . No hypoglycemia reported Last ophthalmologic examination 02/26/12 for detached retina; S/P laser; no retinopathy. Podiatry assessment pending @ [facility]. Diet is modified  low carb  Exercise  irregular 5 X/ week as walking CVE for 30  minutes.  The most recent lipids done @ [facility] January   . There is medical compliance with the statin.  Blood pressure range average 126/65- 163/76; average 143/63 . There is medical  compliance with antihypertensive medications  No  medication adverse effects suggested ; specifically no cough or angioedema      Review of Systems Constitutional: No  significant weight change;  excess fatigue Eyes: No  blurred vision;  double vision ; loss of vision Cardiovascular: no chest pain ;palpitations; racing; irregular rhythm ;syncope; claudication ; edema  Respiratory: EXERTIONAL dyspnea initially but this resolves;  paroxysmal nocturnal dyspnea Genitourinary: No dysuria; hematuria ; pyuria; frequency;  Incontinence;  Nocturia X 0-2 Musculoskeletal: No myalgias or muscle cramping  Dermatologic: No non healing  Lesions; change in color or temperature of skin Neurologic: No  limb weakness;  numbness ; Intermittent BURNING & SHOOTING pains under toes R> L. TINGLING in RUE fingertips sporadically Endocrine: No change in hair, skin, nails. No excessive thirst; excessive hunger;  excessive urination      Objective:   Physical Exam Gen.:  well-nourished in appearance. Alert, appropriate and cooperative throughout exam.Appears younger  than stated age   Eyes: No corneal or conjunctival inflammation noted.  Mouth: Oral mucosa and oropharynx reveal no lesions or exudates. denturesin good repair. Neck: No deformities, masses, or tenderness noted. Thyroid normal Lungs: Normal respiratory effort; chest expands symmetrically. Lungs are clear to auscultation without rales, wheezes, or increased work of breathing. Heart: Slow rate and regular rhythm. Normal S1 and S2. No gallop, click, or rub. No murmur. Abdomen: Bowel sounds normal; abdomen soft and nontender. No masses, organomegaly or hernias noted.Aorta palpable (known  AAA )                                Musculoskeletal/extremities: No clubbing, cyanosis, edema, or significant extremity  deformity noted. Range of motion normal .Tone & strength  Normal. Joints normal . Nail health good. Able to lie down & sit up w/o help.  Vascular: Carotid, radial artery, dorsalis pedis and  posterior tibial pulses are full and equal. No bruits present. Flat venous changes LLE Neurologic: Alert and oriented x3. Deep tendon reflexes symmetrical and normal.  Light touch normal over feet.    Skin: Intact without suspicious lesions or rashes. Lymph: No cervical, axillary lymphadenopathy present.  Psych: Mood and affect are normal. Normally interactive  Assessment & Plan:

## 2012-04-22 NOTE — Assessment & Plan Note (Signed)
Followup with Dr. Jerilee Field in November 2014. Focus should be on maintaining a blood pressure less than 135/85 on average if possible

## 2012-05-07 ENCOUNTER — Encounter: Payer: Self-pay | Admitting: Internal Medicine

## 2012-05-18 ENCOUNTER — Other Ambulatory Visit: Payer: Self-pay | Admitting: Internal Medicine

## 2012-05-30 ENCOUNTER — Encounter: Payer: Self-pay | Admitting: Gastroenterology

## 2012-06-11 ENCOUNTER — Encounter: Payer: Self-pay | Admitting: Gastroenterology

## 2012-07-10 ENCOUNTER — Encounter: Payer: Self-pay | Admitting: Gastroenterology

## 2012-07-10 ENCOUNTER — Ambulatory Visit (AMBULATORY_SURGERY_CENTER): Payer: Medicare Other | Admitting: *Deleted

## 2012-07-10 VITALS — Ht 71.0 in | Wt 184.2 lb

## 2012-07-10 DIAGNOSIS — Z1211 Encounter for screening for malignant neoplasm of colon: Secondary | ICD-10-CM

## 2012-07-10 MED ORDER — NA SULFATE-K SULFATE-MG SULF 17.5-3.13-1.6 GM/177ML PO SOLN: ORAL | Status: DC

## 2012-07-24 ENCOUNTER — Encounter: Payer: Self-pay | Admitting: Gastroenterology

## 2012-07-24 ENCOUNTER — Ambulatory Visit (AMBULATORY_SURGERY_CENTER): Payer: Medicare Other | Admitting: Gastroenterology

## 2012-07-24 VITALS — BP 139/65 | HR 52 | Temp 96.6°F | Resp 21 | Ht 71.0 in | Wt 184.0 lb

## 2012-07-24 DIAGNOSIS — D126 Benign neoplasm of colon, unspecified: Secondary | ICD-10-CM

## 2012-07-24 DIAGNOSIS — Z1211 Encounter for screening for malignant neoplasm of colon: Secondary | ICD-10-CM

## 2012-07-24 DIAGNOSIS — Z86010 Personal history of colonic polyps: Secondary | ICD-10-CM

## 2012-07-24 LAB — GLUCOSE, CAPILLARY: Glucose-Capillary: 82 mg/dL (ref 70–99)

## 2012-07-24 MED ORDER — SODIUM CHLORIDE 0.9 % IV SOLN: 500.0000 mL | INTRAVENOUS | Status: DC

## 2012-07-24 NOTE — Progress Notes (Signed)
Patient did not experience any of the following events: a burn prior to discharge; a fall within the facility; wrong site/side/patient/procedure/implant event; or a hospital transfer or hospital admission upon discharge from the facility. (G8907) Patient did not have preoperative order for IV antibiotic SSI prophylaxis. (G8918)  

## 2012-07-24 NOTE — Progress Notes (Signed)
Called to room to assist during endoscopic procedure.  Patient ID and intended procedure confirmed with present staff. Received instructions for my participation in the procedure from the performing physician.  

## 2012-07-24 NOTE — Patient Instructions (Addendum)
YOU HAD AN ENDOSCOPIC PROCEDURE TODAY AT [facility]: Refer to the procedure report that was given to you for any specific questions about what was found during the examination.  If the procedure report does not answer your questions, please call your gastroenterologist to clarify.  If you requested that your care partner not be given the details of your procedure findings, then the procedure report has been included in a sealed envelope for you to review at your convenience later.  YOU SHOULD EXPECT: Some feelings of bloating in the abdomen. Passage of more gas than usual.  Walking can help get rid of the air that was put into your GI tract during the procedure and reduce the bloating. If you had a lower endoscopy (such as a colonoscopy or flexible sigmoidoscopy) you may notice spotting of blood in your stool or on the toilet paper. If you underwent a bowel prep for your procedure, then you may not have a normal bowel movement for a few days.  DIET: Your first meal following the procedure should be a light meal and then it is ok to progress to your normal diet.  A half-sandwich or bowl of soup is an example of a good first meal.  Heavy or fried foods are harder to digest and may make you feel nauseous or bloated.  Likewise meals heavy in dairy and vegetables can cause extra gas to form and this can also increase the bloating.  Drink plenty of fluids but you should avoid alcoholic beverages for 24 hours.  ACTIVITY: Your care partner should take you home directly after the procedure.  You should plan to take it easy, moving slowly for the rest of the day.  You can resume normal activity the day after the procedure however you should NOT DRIVE or use heavy machinery for 24 hours (because of the sedation medicines used during the test).    SYMPTOMS TO REPORT IMMEDIATELY: A gastroenterologist can be reached at any hour.  During normal business hours, 8:30 AM to 5:00 PM Monday through Friday,  call [phone].  After hours and on weekends, please call the GI answering service at [phone] who will take a message and have the physician on call contact you.   Following lower endoscopy (colonoscopy or flexible sigmoidoscopy):  Excessive amounts of blood in the stool  Significant tenderness or worsening of abdominal pains  Swelling of the abdomen that is new, acute  Fever of 100F or higher s  FOLLOW UP: If any biopsies were taken you will be contacted by phone or by letter within the next 1-3 weeks.  Call your gastroenterologist if you have not heard about the biopsies in 3 weeks.  Our staff will call the home number listed on your records the next business day following your procedure to check on you and address any questions or concerns that you may have at that time regarding the information given to you following your procedure. This is a courtesy call and so if there is no answer at the home number and we have not heard from you through the emergency physician on call, we will assume that you have returned to your regular daily activities without incident.  SIGNATURES/CONFIDENTIALITY: You and/or your care partner have signed paperwork which will be entered into your electronic medical record.  These signatures attest to the fact that that the information above on your After Visit Summary has been reviewed and is understood.  Full responsibility of the confidentiality of  this discharge information lies with you and/or your care-partner.  Polyp information given.

## 2012-07-24 NOTE — Progress Notes (Signed)
Report to pacu rn, vss, bbs=clear 

## 2012-07-24 NOTE — Op Note (Signed)
[facility] [address] [place], [address]   COLONOSCOPY PROCEDURE REPORT  PATIENT: Brian, [name]  MR#: [id number] BIRTHDATE: August 08, 1931 , 80  yrs. old GENDER: Male ENDOSCOPIST: Meryl Dare, MD, [facility] PROCEDURE DATE:  07/24/2012 PROCEDURE:   Colonoscopy with snare polypectomy ASA CLASS:   Class II INDICATIONS:Patient's personal history of adenomatous colon polyps.  MEDICATIONS: MAC sedation, administered by CRNA and propofol (Diprivan) 170mg  IV DESCRIPTION OF PROCEDURE:   After the risks benefits and alternatives of the procedure were thoroughly explained, informed consent was obtained.  A digital rectal exam revealed no abnormalities of the rectum.   The LB [id number]  endoscope was introduced through the anus and advanced to the cecum, which was identified by both the appendix and ileocecal valve. No adverse events experienced.   The quality of the prep was excellent, using MoviPrep  The instrument was then slowly withdrawn as the colon was fully examined.  COLON FINDINGS: A 4mm, non-bleeding AVM was noted at the cecum.  A sessile polyp measuring 5 mm in size was found in the ascending colon.  A polypectomy was performed with a cold snare.  The resection was complete and the polyp tissue was completely retrieved.   The colon was otherwise normal.  There was no diverticulosis, inflammation, polyps or cancers unless previously stated.  Retroflexed views revealed no abnormalities. The time to cecum=3 minutes 33 seconds.  Withdrawal time=8 minutes 51 seconds. The scope was withdrawn and the procedure completed.  COMPLICATIONS: There were no complications.  ENDOSCOPIC IMPRESSION: 1.   AVM at the cecum 2.   Sessile polyp measuring 5 mm in the ascending colon; polypectomy performed with a cold snare  RECOMMENDATIONS: 1.  Await pathology results 2.  Given your age, you will not need another colonoscopy for colon cancer screening or polyp  surveillance.  These types of tests usually stop around the age 47.   eSigned:  Meryl Dare, MD, [facility] 07/24/2012 10:35 AM

## 2012-07-25 ENCOUNTER — Telehealth: Payer: Self-pay | Admitting: *Deleted

## 2012-07-25 NOTE — Telephone Encounter (Signed)
  Follow up Call-  Call back number 07/24/2012  Post procedure Call Back phone  # [phone]  Permission to leave phone message Yes     Patient questions:  Do you have a fever, pain , or abdominal swelling? no Pain Score  0 *  Have you tolerated food without any problems? yes  Have you been able to return to your normal activities? yes  Do you have any questions about your discharge instructions: Diet   no Medications  no Follow up visit  no  Do you have questions or concerns about your Care? no  Actions: * If pain score is 4 or above: No action needed, pain <4.  Pt. Stated that he had a "runny nose" up until bedtime last night.  No dyspnea with cough.  He did cough and sneeze often post procedure yesterday.   Denies any allergy to eggs.  Advised to call us back if needed. States runny nose has resolved this am.

## 2012-08-03 ENCOUNTER — Encounter: Payer: Self-pay | Admitting: Gastroenterology

## 2012-09-08 ENCOUNTER — Ambulatory Visit (INDEPENDENT_AMBULATORY_CARE_PROVIDER_SITE_OTHER): Payer: Medicare Other | Admitting: Internal Medicine

## 2012-09-08 ENCOUNTER — Encounter: Payer: Self-pay | Admitting: Internal Medicine

## 2012-09-08 VITALS — BP 130/78 | HR 52 | Temp 97.7°F | Wt 182.0 lb

## 2012-09-08 DIAGNOSIS — J310 Chronic rhinitis: Secondary | ICD-10-CM

## 2012-09-08 MED ORDER — FLUTICASONE PROPIONATE 50 MCG/ACT NA SUSP: NASAL | Status: DC

## 2012-09-08 NOTE — Progress Notes (Signed)
  Subjective:    Patient ID: Brian [name], male    DOB: May 26, 1931, 79 y.o.   MRN: [id number]  HPI   Symptoms began 3 months ago as rhinitis with clear drainage. This has persisted and been stable. There was no significant response to Claritin or Allegra 12.  He has a past history of asthma but has been quiescent. He is not using inhalers at this time. He quit smoking in 2005.    Review of Systems  He denies other extrinsic symptoms such as itchy, watery eyes or sneezing. There's been no associated fever, chills, sweats.  He has not had rhinosinusitis symptoms of frontal headache, facial pain, nasal purulence, dental pain, sore throat, otic pain, or otic discharge.  He has no cough or sputum production. He states that rarely he'll note a wheeze while supine at night.     Objective:   Physical Exam General appearance:thin but in good health ;well nourished; no acute distress or increased work of breathing is present.  No  lymphadenopathy about the head, neck, or axilla noted.   Eyes: No conjunctival inflammation or lid edema is present. Minor pterygia bilaterally  Ears:  External ear exam shows no significant lesions or deformities.  Otoscopic examination reveals clear canals, tympanic membranes are intact bilaterally without bulging, retraction, inflammation or discharge.  Nose:  External nasal examination shows no deformity or inflammation. Nasal mucosa are dry w/o without lesions or exudates. No septal dislocation or deviation.No obstruction to airflow.   Oral exam: Dentures; lips and gums are healthy appearing.There is no oropharyngeal erythema or exudate noted.   Neck:  No deformities,  masses, or tenderness noted.      Heart:  Slow rate and slightly irregular rhythm. S1 and S2 normal without gallop, murmur, click, rub or other extra sounds.   Lungs:Chest clear to auscultation; no wheezes, rhonchi,rales ,or rubs present.No increased work of breathing.Decreased  BS  Extremities:  No cyanosis, edema, or clubbing  noted    Skin: Warm & dry          Assessment & Plan:  #1rhinitis See Orders

## 2012-09-08 NOTE — Patient Instructions (Addendum)

## 2012-09-18 ENCOUNTER — Other Ambulatory Visit: Payer: Self-pay | Admitting: Internal Medicine

## 2012-09-24 ENCOUNTER — Other Ambulatory Visit: Payer: Self-pay

## 2012-09-29 ENCOUNTER — Other Ambulatory Visit: Payer: Self-pay | Admitting: Internal Medicine

## 2012-10-21 ENCOUNTER — Other Ambulatory Visit: Payer: Self-pay | Admitting: Internal Medicine

## 2012-10-21 ENCOUNTER — Encounter: Payer: Self-pay | Admitting: Internal Medicine

## 2012-10-22 ENCOUNTER — Other Ambulatory Visit: Payer: Self-pay | Admitting: *Deleted

## 2012-10-22 NOTE — Telephone Encounter (Signed)
This was opened in error I see that patient has 5 refills for this medication.  Ag cma

## 2012-10-22 NOTE — Telephone Encounter (Signed)
Med filled.  

## 2012-10-27 ENCOUNTER — Telehealth: Payer: Self-pay | Admitting: *Deleted

## 2012-10-27 ENCOUNTER — Other Ambulatory Visit: Payer: Self-pay | Admitting: *Deleted

## 2012-10-27 DIAGNOSIS — J310 Chronic rhinitis: Secondary | ICD-10-CM

## 2012-10-27 MED ORDER — FLUTICASONE PROPIONATE 50 MCG/ACT NA SUSP: NASAL | Status: DC

## 2012-10-27 NOTE — Telephone Encounter (Signed)
Patient requested that his prescription for Flonase be sent to [facility] and it has been done.  Ag cma

## 2012-10-27 NOTE — Telephone Encounter (Signed)
Rx request from [facility], However patient has already picked up his prescription from [facility] 09/08/12 and he still has 4 -R left.  I have attempted to contact patient to ask would he like his medication switched to [facility] had to leave vm.   Ag cma

## 2012-10-29 ENCOUNTER — Encounter: Payer: Self-pay | Admitting: Internal Medicine

## 2012-10-29 ENCOUNTER — Ambulatory Visit (INDEPENDENT_AMBULATORY_CARE_PROVIDER_SITE_OTHER): Payer: Medicare Other | Admitting: Internal Medicine

## 2012-10-29 VITALS — BP 122/64 | HR 58 | Temp 97.7°F | Wt 181.6 lb

## 2012-10-29 DIAGNOSIS — M109 Gout, unspecified: Secondary | ICD-10-CM

## 2012-10-29 DIAGNOSIS — R7309 Other abnormal glucose: Secondary | ICD-10-CM

## 2012-10-29 DIAGNOSIS — B353 Tinea pedis: Secondary | ICD-10-CM

## 2012-10-29 DIAGNOSIS — G609 Hereditary and idiopathic neuropathy, unspecified: Secondary | ICD-10-CM | POA: Insufficient documentation

## 2012-10-29 DIAGNOSIS — I10 Essential (primary) hypertension: Secondary | ICD-10-CM

## 2012-10-29 DIAGNOSIS — E785 Hyperlipidemia, unspecified: Secondary | ICD-10-CM

## 2012-10-29 DIAGNOSIS — R7303 Prediabetes: Secondary | ICD-10-CM

## 2012-10-29 LAB — BASIC METABOLIC PANEL
BUN: 22 mg/dL (ref 6–23)
CO2: 27 mEq/L (ref 19–32)
Chloride: 105 mEq/L (ref 96–112)
Creatinine, Ser: 1.4 mg/dL (ref 0.4–1.5)

## 2012-10-29 LAB — AST: AST: 16 U/L (ref 0–37)

## 2012-10-29 LAB — LIPID PANEL
Cholesterol: 139 mg/dL (ref 0–200)
HDL: 43.9 mg/dL (ref >39.00)
LDL Cholesterol: 77 mg/dL (ref 0–99)
VLDL: 17.8 mg/dL (ref 0.0–40.0)

## 2012-10-29 LAB — MICROALBUMIN / CREATININE URINE RATIO: Microalb, Ur: 1.4 mg/dL (ref 0.0–1.9)

## 2012-10-29 LAB — TSH: TSH: 1.32 u[IU]/mL (ref 0.35–5.50)

## 2012-10-29 MED ORDER — TRIAMCINOLONE ACETONIDE 0.1 % EX CREA: TOPICAL_CREAM | CUTANEOUS | Status: DC

## 2012-10-29 MED ORDER — TRIAMCINOLONE ACETONIDE 0.1 % EX CREA: TOPICAL_CREAM | CUTANEOUS | Status: DC | PRN

## 2012-10-29 NOTE — Assessment & Plan Note (Addendum)
A1c and urine microalbumin 

## 2012-10-29 NOTE — Patient Instructions (Addendum)
Please review the record and make any corrections; share this with all medical staff seen. If you note change or progression in symptoms please contact us through My Chart ASAP. This will allow us to respond as quickly as possible and schedule appropriate studies (blood test or imaging). 

## 2012-10-29 NOTE — Assessment & Plan Note (Signed)
With history of aortic aneurysm; blood pressure goal is average less than 135/85 minimally

## 2012-10-29 NOTE — Assessment & Plan Note (Signed)
LDL was 86 in February at the [place]. It should be less than 70. Lipids will be checked

## 2012-10-29 NOTE — Assessment & Plan Note (Signed)
Uric acid

## 2012-10-29 NOTE — Progress Notes (Signed)
Subjective:    Patient ID: Brian [name], male    DOB: Apr 22, 1931, 79 y.o.   MRN: [id number]  HPI He is here for followup of his pre-diabetic condition. His last fasting glucose here was 106 with an A1c of 6.3% in September of 2013. He is on no diabetic agents  He has dyslipidemia and is on simvastatin. Lipids were at goal in September.  The [place] records were reviewed. His LDL was 86 in February and A1c 6.5%. Urine microalbumin was elevated at 2.66.    Review of Systems He is on a heart healthy diet; he exercises 30 minutes 5 times per week without symptoms. Specifically he denies chest pain, palpitations, dyspnea, or claudication.  Family history is positive for premature coronary disease in his sister who had a heart attack at 34. His brother and father had heart attacks at 39 and 63 respectively. His LDL goal is less than 70.  He has not had gout in the recent past  His fasting blood sugars average 121 and two-hour postprandial glucoses average 166. He denies polyphagia, polydipsia, or probably urea. He does have nocturia intermittently up to 2-3 times per night.  He's been diagnosis of an early peripheral neuropathy in the [place]; topical agent has been prescribed which has been beneficial.  He has no nonhealing lesions of the extremities.     Objective:   Physical Exam Gen.: Healthy and well-nourished in appearance. Alert, appropriate and cooperative throughout exam.Appears younger than stated age   Eyes: No corneal or conjunctival inflammation noted. Pterygia Nose: External nasal exam reveals no deformity or inflammation. Nasal mucosa are pink and moist. No lesions or exudates noted.   Mouth: Oral mucosa and oropharynx reveal no lesions or exudates. Dentures in good repair. Neck: No deformities, masses, or tenderness noted.  Thyroid small. Lungs: Normal respiratory effort; chest expands symmetrically. Lungs are clear to auscultation without rales, wheezes, or increased work of  breathing. Heart: Slow rate and regular rhythm. Normal S1 and S2. No gallop, click, or rub. No murmur. Abdomen: Bowel sounds normal; abdomen soft and nontender. No masses, organomegaly or hernias noted.                                  Musculoskeletal/extremities:  No clubbing, cyanosis, edema, or significant extremity  deformity noted. Range of motion normal .Tone & strength  Normal. Joints normal . Post traumatic nail changes. Able to lie down & sit up w/o help. Negative SLR bilaterally Vascular: Carotid, radial artery, dorsalis pedis and  posterior tibial pulses are full and equal. No bruits present. Neurologic: Alert and oriented x3. Deep tendon reflexes symmetrical and normal.   Light touch normal over feet.      Skin: Intact without suspicious lesions or rashes. Lymph: No cervical, axillary lymphadenopathy present. Psych: Mood and affect are normal. Normally interactive                                                                                        Assessment & Plan:  See Current Assessment & Plan in Problem List under specific Diagnosis

## 2012-11-05 ENCOUNTER — Encounter: Payer: Self-pay | Admitting: Internal Medicine

## 2012-11-20 ENCOUNTER — Ambulatory Visit (INDEPENDENT_AMBULATORY_CARE_PROVIDER_SITE_OTHER): Payer: Medicare Other

## 2012-11-20 DIAGNOSIS — Z23 Encounter for immunization: Secondary | ICD-10-CM

## 2012-11-21 ENCOUNTER — Other Ambulatory Visit: Payer: Self-pay | Admitting: Internal Medicine

## 2012-11-21 NOTE — Telephone Encounter (Signed)
Refill for simvastatin sent to [facility] 

## 2012-11-30 ENCOUNTER — Other Ambulatory Visit: Payer: Self-pay | Admitting: Internal Medicine

## 2012-12-01 ENCOUNTER — Other Ambulatory Visit: Payer: Self-pay | Admitting: *Deleted

## 2012-12-01 DIAGNOSIS — I2510 Atherosclerotic heart disease of native coronary artery without angina pectoris: Secondary | ICD-10-CM

## 2012-12-01 DIAGNOSIS — I10 Essential (primary) hypertension: Secondary | ICD-10-CM

## 2012-12-01 MED ORDER — ENALAPRIL MALEATE 10 MG PO TABS: ORAL_TABLET | ORAL | Status: DC

## 2012-12-01 NOTE — Telephone Encounter (Signed)
Enalapril refill sent to pharmacy

## 2012-12-23 ENCOUNTER — Other Ambulatory Visit: Payer: Self-pay | Admitting: Vascular Surgery

## 2012-12-23 LAB — CREATININE, SERUM: Creat: 1.36 mg/dL — ABNORMAL HIGH (ref 0.50–1.35)

## 2012-12-23 LAB — BUN: BUN: 19 mg/dL (ref 6–23)

## 2012-12-29 ENCOUNTER — Encounter: Payer: Self-pay | Admitting: Vascular Surgery

## 2012-12-30 ENCOUNTER — Ambulatory Visit (INDEPENDENT_AMBULATORY_CARE_PROVIDER_SITE_OTHER): Payer: Medicare Other | Admitting: Vascular Surgery

## 2012-12-30 ENCOUNTER — Encounter: Payer: Self-pay | Admitting: Vascular Surgery

## 2012-12-30 ENCOUNTER — Ambulatory Visit
Admission: RE | Admit: 2012-12-30 | Discharge: 2012-12-30 | Disposition: A | Discharge status: Home / Self Care | Payer: Medicare Other | Source: Ambulatory Visit | Attending: Vascular Surgery | Admitting: Vascular Surgery

## 2012-12-30 ENCOUNTER — Other Ambulatory Visit: Payer: Self-pay | Admitting: Vascular Surgery

## 2012-12-30 VITALS — BP 138/65 | HR 65 | Ht 71.0 in | Wt 183.0 lb

## 2012-12-30 DIAGNOSIS — I714 Abdominal aortic aneurysm, without rupture: Secondary | ICD-10-CM

## 2012-12-30 MED ORDER — IOHEXOL 350 MG/ML SOLN
80.0000 mL | Freq: Once | INTRAVENOUS | Status: AC | PRN
  Administered 2012-12-30: 80 mL via INTRAVENOUS

## 2012-12-30 NOTE — Progress Notes (Signed)
Subjective:     Patient ID: Brian [name], male   DOB: 1931/06/13, 79 y.o.   MRN: [id number]  HPI this 79 year old male returns for continued followup regarding his abdominal aortic aneurysm which was discovered 1 year ago by Dr. Lona Kettle on ultrasound exam. At that time it measured 4.2 cm. Patient has had no abnormal or back symptoms and has been healthy over the past 12 months. He does have a remote history of coronary artery bypass grafting 2 years ago but has had no cardiac symptoms.  Past Medical History  Diagnosis Date  . CAD (coronary artery disease)     NSTEMI 02/2010;  s/p CABG 1/12 (L-LAD, SOM1, S-PDA); echo in 08/2010: Mild LVH, EF 55%, grade 2 diastolic dysfunction, mild MR, mild LAE, mildly reduced RV function, mild RAE, PASP 42  . Diabetes mellitus 2000    adult onset  . Gout   . ED (erectile dysfunction)   . Hyperlipidemia   . Colon polyps   . Macular degeneration   . AAA (abdominal aortic aneurysm)   . Asthma 1976  . HTN (hypertension)   . COPD (chronic obstructive pulmonary disease)   . Myocardial infarction 1 -13-2012  . Neuropathy     History  Substance Use Topics  . Smoking status: Former Smoker    Types: Cigarettes    Quit date: 02/20/2003  . Smokeless tobacco: Never Used  . Alcohol Use: 3.6 oz/week    3 Glasses of wine, 3 Shots of liquor per week     Comment:      Family History  Problem Relation Age of Onset  . Heart failure Mother   . Heart disease Mother     CHF  . Heart attack Father   . Heart disease Father 34    MI  . Heart attack Sister 14  . Heart disease Sister 62    MI  . Diabetes Sister   . Hyperlipidemia Sister   . Heart attack Brother 90  . Diabetes Brother   . Heart disease Brother 63    MI  . Cancer Brother   . Deep vein thrombosis Daughter   . Other Son     varicose veins  . Colon cancer Neg Hx     Allergies  Allergen Reactions  . Meperidine Hcl     Tongue swelling Because of a history of documented adverse  serious drug reaction;Medi Alert bracelet  is recommended  . Zolpidem Tartrate     REACTION: Crazy dreams    Current outpatient prescriptions:ACCU-CHEK COMPACT STRIPS test strip, TEST BLOOD SUGAR ONCE DAILY, Disp: 100 each, Rfl: 1;  allopurinol (ZYLOPRIM) 100 MG tablet, TAKE 1 TABLET DAILY, Disp: 90 tablet, Rfl: 1;  Aspirin (ADULT ASPIRIN LOW STRENGTH) 81 MG EC tablet, Take 81 mg by mouth daily.  , Disp: , Rfl: ;  capsaicin (ZOSTRIX) 0.025 % cream, Apply 1 application topically as needed., Disp: , Rfl:  capsicum oleoresin (TRIXAICIN) 0.025 % cream, Apply 1 application topically as needed., Disp: , Rfl: ;  cetirizine (ZYRTEC) 10 MG tablet, Take 10 mg by mouth daily., Disp: , Rfl: ;  cholecalciferol (VITAMIN D) 1000 UNITS tablet, 2 ;000 units daily, Disp: , Rfl: ;  enalapril (VASOTEC) 10 MG tablet, TAKE ONE AND ONE-HALF TABLETS DAILY, Disp: 135 tablet, Rfl: 1;  enalapril (VASOTEC) 10 MG tablet, 1& 1/2 qd, Disp: 135 tablet, Rfl: 1 fluticasone (FLONASE) 50 MCG/ACT nasal spray, 1 spray in each nostril twice a day as needed. Use the "crossover" technique  as discussed, Disp: 16 g, Rfl: 5;  metoprolol succinate (TOPROL-XL) 25 MG 24 hr tablet, 1/2 tablet daily, Disp: 45 tablet, Rfl: 3;  ranitidine (ZANTAC) 150 MG tablet, TAKE 1 TABLET TWICE A DAY, Disp: 180 tablet, Rfl: 1;  simvastatin (ZOCOR) 40 MG tablet, TAKE 1 TABLET AT BEDTIME, Disp: 90 tablet, Rfl: 1 temazepam (RESTORIL) 7.5 MG capsule, Take 7.5 mg by mouth at bedtime as needed., Disp: , Rfl: ;  triamcinolone cream (KENALOG) 0.1 %, Bid prn, Disp: 240 g, Rfl: 2  BP 138/65  Pulse 65  Ht 5\' 11"  (1.803 m)  Wt 183 lb (83.008 kg)  BMI 25.53 kg/m2  SpO2 94%  Body mass index is 25.53 kg/(m^2).          Review of Systems denies chest pain but does have mild dyspnea on exertion. No claudication, lateralizing weakness, aphasia, or any other symptoms in the complete review of systems     Objective:   Physical Exam BP 138/65  Pulse 65  Ht 5\' 11"   (1.803 m)  Wt 183 lb (83.008 kg)  BMI 25.53 kg/m2  SpO2 94%  Gen.-alert and oriented x3 in no apparent distress HEENT normal for age Lungs no rhonchi or wheezing Cardiovascular regular rhythm no murmurs carotid pulses 3+ palpable no bruits audible Abdomen soft nontender no palpable masses Musculoskeletal free of  major deformities Skin clear -no rashes Neurologic normal Lower extremities 3+ femoral and dorsalis pedis pulses palpable bilaterally with no edema  Today I ordered a CT angiogram of the abdomen and pelvis which are reviewed a computer. The aneurysm does appear to be a good candidate for stent grafting if ever necessary. Maximum diameter today is between 4.6 and 4.7 cm-larger than the previous measurement one year ago which was by duplex scanning      Assessment:     4.6 cm infrarenal abdominal aortic aneurysm-good candidate for aortic stent grafting if necessary    Plan:     Return in 6 months with duplex scan in our office to follow this aortic aneurysm

## 2013-01-06 ENCOUNTER — Ambulatory Visit: Payer: Medicare Other | Admitting: Vascular Surgery

## 2013-01-06 ENCOUNTER — Other Ambulatory Visit: Payer: Medicare Other

## 2013-01-19 ENCOUNTER — Telehealth: Payer: Self-pay | Admitting: Internal Medicine

## 2013-01-19 ENCOUNTER — Other Ambulatory Visit: Payer: Self-pay | Admitting: Internal Medicine

## 2013-01-19 DIAGNOSIS — G609 Hereditary and idiopathic neuropathy, unspecified: Secondary | ICD-10-CM

## 2013-01-19 DIAGNOSIS — R7303 Prediabetes: Secondary | ICD-10-CM

## 2013-01-19 NOTE — Telephone Encounter (Signed)
Order placed

## 2013-01-19 NOTE — Telephone Encounter (Signed)
Please advise 

## 2013-01-19 NOTE — Telephone Encounter (Signed)
Patient is calling to request a referral to see a podiatrist for his diabetic foot pain. Please advise.

## 2013-01-24 ENCOUNTER — Other Ambulatory Visit: Payer: Self-pay | Admitting: Cardiovascular Disease

## 2013-01-28 LAB — HM DIABETES FOOT EXAM

## 2013-02-03 ENCOUNTER — Ambulatory Visit (INDEPENDENT_AMBULATORY_CARE_PROVIDER_SITE_OTHER): Payer: Medicare Other

## 2013-02-03 VITALS — BP 162/86 | HR 55 | Resp 20 | Ht 71.0 in | Wt 177.0 lb

## 2013-02-03 DIAGNOSIS — R52 Pain, unspecified: Secondary | ICD-10-CM

## 2013-02-03 DIAGNOSIS — E1140 Type 2 diabetes mellitus with diabetic neuropathy, unspecified: Secondary | ICD-10-CM

## 2013-02-03 DIAGNOSIS — E1142 Type 2 diabetes mellitus with diabetic polyneuropathy: Secondary | ICD-10-CM

## 2013-02-03 DIAGNOSIS — E1149 Type 2 diabetes mellitus with other diabetic neurological complication: Secondary | ICD-10-CM

## 2013-02-03 DIAGNOSIS — M2040 Other hammer toe(s) (acquired), unspecified foot: Secondary | ICD-10-CM

## 2013-02-03 NOTE — Progress Notes (Signed)
Subjective:    Patient ID: Brian [name], male    DOB: [birth year], 79 y.o.   MRN: [id number] "Foot pain, under the bottom I'm having shooting pains.  It doesn't happen every day."  Foot Pain This is a new (Plantar Pain B/L, Shooting Pains from Metatarsals To Toes) problem. Episode onset: 5 years. The problem occurs intermittently. The problem has been gradually worsening. Associated symptoms comments: Diabetes, Neuropathy;  Tingling on Right foot. Nothing aggravates the symptoms. Treatments tried: Trixaicin Cream. The treatment provided mild relief.   patient has greater than five-year history of non-insulin-dependent diabetes diet controlled. Most recent A1c is been 6.5 patient cases been steady all along.    Review of Systems  Constitutional: Negative.   HENT: Negative.   Respiratory: Negative.   Cardiovascular: Negative.   Gastrointestinal: Negative.   Endocrine: Negative.   Musculoskeletal: Negative.   Skin: Negative.   Allergic/Immunologic: Negative.   Neurological: Negative.   Hematological: Negative.   Psychiatric/Behavioral: Negative.   All other systems reviewed and are negative.       Objective:   Physical Exam  Vitals reviewed. Constitutional: He is oriented to person, place, and time. He appears well-developed and well-nourished.  Cardiovascular:  Pulses:      Dorsalis pedis pulses are 2+ on the right side, and 2+ on the left side.       Posterior tibial pulses are 1+ on the right side, and 1+ on the left side.  Capillary refill timed 3-4 seconds all digits. Skin temperature warm. Turgor normal. Moderate varicosities noted both lower extremities left slightly worse than right  Musculoskeletal:  Orthopedic biomechanical exam reveals rectus foot type bilateral some mild flexible digital contractures 2 through 5/hammertoe deformities. Hallux is rectus bilateral no signs of fracture or other osseous abnormalities x-rays reveal mild inferior and retrocalcaneal  spurring. There is some promontory changes more so on the left and right foot with some Lisfranc's asymmetric joint space narrowing and possibly early arthropathy most significant Lisfranc for fifth metatarsal base and cuboid left more so than right  Neurological: He is alert and oriented to person, place, and time. He has normal strength and normal reflexes.  Epicritic and proprioceptive sensations appear to be intact bilateral there is normal plantar response. DTRs intact and symmetric bilateral. Patient is describing paresthesias or burning in his feet and toes especially when he goes to bed on certain days this is not constant and worse a reproducible at today's visit.  Skin: Skin is warm and dry. No cyanosis. Nails show no clubbing.  Skin color pigment normal hair growth diminished absent nails slightly criptotic with slight discoloration. No open wounds ulcerations no significant keratoses identified.  Psychiatric: He has a normal mood and affect. His behavior is normal.          Assessment & Plan:  Assessment this time his diabetes with early diabetic neuropathy and slight paresthesias. Orthopedic biomechanical exam unremarkable with rectus feet noted bilateral only significant finding other than the neuropathy is patient's wearing his shoes 2 short [place] shoes actually just barely long enough for his toes suggested going up a half size to a size in length to allow more room for toes. The tight shoes may also be causing some of the constriction and possible neuritis or neuralgia which is affecting his toes and feet. Patient also recommended to try a adults or senior multivitamin with B12 complex and folic acid suggested are read followup and revisit in 3-6 months to assess any changes or  exacerbations. Suggest a 36 month followup next  Alvan Dame DPM

## 2013-02-03 NOTE — Patient Instructions (Signed)
Diabetes and Foot Care Diabetes may cause you to have problems because of poor blood supply (circulation) to your feet and legs. This may cause the skin on your feet to become thinner, break easier, and heal more slowly. Your skin may become dry, and the skin may peel and crack. You may also have nerve damage in your legs and feet causing decreased feeling in them. You may not notice minor injuries to your feet that could lead to infections or more serious problems. Taking care of your feet is one of the most important things you can do for yourself.  HOME CARE INSTRUCTIONS  Wear shoes at all times, even in the house. Do not go barefoot. Bare feet are easily injured.  Check your feet daily for blisters, cuts, and redness. If you cannot see the bottom of your feet, use a mirror or ask someone for help.  Wash your feet with warm water (do not use hot water) and mild soap. Then pat your feet and the areas between your toes until they are completely dry. Do not soak your feet as this can dry your skin.  Apply a moisturizing lotion or petroleum jelly (that does not contain alcohol and is unscented) to the skin on your feet and to dry, brittle toenails. Do not apply lotion between your toes.  Trim your toenails straight across. Do not dig under them or around the cuticle. File the edges of your nails with an emery board or nail file.  Do not cut corns or calluses or try to remove them with medicine.  Wear clean socks or stockings every day. Make sure they are not too tight. Do not wear knee-high stockings since they may decrease blood flow to your legs.  Wear shoes that fit properly and have enough cushioning. To break in new shoes, wear them for just a few hours a day. This prevents you from injuring your feet. Always look in your shoes before you put them on to be sure there are no objects inside.  Do not cross your legs. This may decrease the blood flow to your feet.  If you find a minor scrape,  cut, or break in the skin on your feet, keep it and the skin around it clean and dry. These areas may be cleansed with mild soap and water. Do not cleanse the area with peroxide, alcohol, or iodine.  When you remove an adhesive bandage, be sure not to damage the skin around it.  If you have a wound, look at it several times a day to make sure it is healing.  Diabetic Neuropathy Diabetic neuropathy is a nerve disease or nerve damage that is caused by diabetes mellitus. About half of all people with diabetes mellitus have some form of nerve damage. Nerve damage is more common in those who have had diabetes mellitus for many years and who generally have not had good control of their blood sugar (glucose) level. Diabetic neuropathy is a common complication of diabetes mellitus. There are three more common types of diabetic neuropathy and a fourth type that is less common and less understood:   Peripheral neuropathy This is the most common type of diabetic neuropathy. It causes damage to the nerves of the feet and legs first and then eventually the hands and arms.The damage affects the ability to sense touch.  Autonomic neuropathy This type causes damage to the autonomic nervous system, which controls the following functions:  Heartbeat.  Body temperature.  Blood pressure.  Urination.  Digestion.  Sweating.  Sexual function.  Focal neuropathy Focal neuropathy can be painful and unpredictable and occurs most often in older adults with diabetes mellitus. It involves a specific nerve or one area and often comes on suddenly. It usually does not cause long-term problems.  Radiculoplexus neuropathy Sometimes called lumbosacral radiculoplexus neuropathy, radiculoplexus neuropathy affects the nerves of the thighs, hips, buttocks, or legs. It is more common in people with type 2 diabetes mellitus and in older men. It is characterized by debilitating pain, weakness, and atrophy, usually in the thigh  muscles. CAUSES  The cause of peripheral, autonomic, and focal neuropathies is diabetes mellitus that is uncontrolled and high glucose levels. The cause of radiculoplexus neuropathy is unknown. However, it is thought to be caused by inflammation related to uncontrolled glucose levels. SIGNS AND SYMPTOMS  Peripheral Neuropathy Peripheral neuropathy develops slowly over time. When the nerves of the feet and legs no longer work there may be:   Burning, stabbing, or aching pain in the legs or feet.  Inability to feel pressure or pain in your feet. This can lead to:  Thick calluses over pressure areas.  Pressure sores.  Ulcers.  Foot deformities.  Reduced ability to feel temperature changes.  Muscle weakness. Autonomic Neuropathy The symptoms of autonomic neuropathy vary depending on which nerves are affected. Symptoms may include:  Problems with digestion, such as:  Feeling sick to your stomach (nausea).  Vomiting.  Bloating.  Constipation.  Diarrhea.  Abdominal pain.  Difficulty with urination. This occurs if you lose your ability to sense when your bladder is full. Problems include:  Urine leakage (incontinence).  Inability to empty your bladder completely (retention).  Rapid or irregular heartbeat (palpitations).  Blood pressure drops when you stand up (orthostatic hypotension). When you stand up you may feel:  Dizzy.  Weak.  Faint.  In men, inability to attain and maintain an erection.  In women, vaginal dryness and problems with decreased sexual desire and arousal.  Problems with body temperature regulation.  Increased or decreased sweating. Focal Neuropathy  Abnormal eye movements or abnormal alignment of both eyes.  Weakness in the wrist.  Foot drop. This results in an inability to lift the foot properly and abnormal walking or foot movement.  Paralysis on one side of your face (Bell palsy).  Chest or abdominal pain. Radiculoplexus  Neuropathy  Sudden, severe pain in your hip, thigh, or buttocks.  Weakness and wasting of thigh muscles.  Difficulty rising from a seated position.  Abdominal swelling.  Unexplained weight loss (usually more than 10 lb [4.5 kg]). DIAGNOSIS  Peripheral Neuropathy Your senses may be tested. Sensory function testing can be done with:  A light touch using a monofilament.  A vibration with tuning fork.  A sharp sensation with a pin prick Other tests that can help diagnose neuropathy are:  Nerve conduction velocity. This test checks the transmission of an electrical current through a nerve.  Electromyography. This shows how muscles respond to electrical signals transmitted by nearby nerves.  Quantitative sensory testing. This is used to assess how your nerves respond to vibrations and changes in temperature. Autonomic Neuropathy Diagnosis is often based on reported symptoms. Tell your health care provider if you experience:   Dizziness.   Constipation.   Diarrhea.   Inappropriate urination or inability to urinate.   Inability to get or maintain an erection.  Tests that may be done include:   Electrocardiography or Holter monitor. These are tests that can help show problems with  the heart rate or heart rhythm.   An X-ray exam may be done. Focal Neuropathy Diagnosis is made based on your symptoms and what your health care provider finds during your exam. Other tests may be done. They may include:  Nerve conduction velocities. This checks the transmission of electrical current through a nerve.  Electromyography. This shows how muscles respond to electrical signals transmitted by nearby nerves.  Quantitative sensory testing. This test is used to assess how your nerves respond to vibration and changes in temperature. Radiculoplexus Neuropathy  Often the first thing is to eliminate any other issue or problems that might be the cause, as there is no stick test for  diagnosis.  X-ray exam of your spine and lumbar region.  Spinal tap to rule out cancer.  MRI to rule out other lesions. TREATMENT  Once nerve damage occurs it cannot be reversed. The goal of treatment is to keep the disease or nerve damage from getting worse and affecting more nerve fibers. Controlling your blood glucose level is the key. Most people with radiculoplexus neuropathy see at least a partial improvement over time. You will need to keep your blood glucose and HbA1c levels in the target range determined by your health care provider. Things that help control blood glucose levels include:   Blood glucose monitoring.   Meal planning.   Physical activity.   Diabetes medicine.  Over time, maintaining lower blood glucose levels helps lessen symptoms. Sometimes, prescription pain medicine is needed. HOME CARE INSTRUCTIONS:  Do not smoke.  Keep your blood glucose level in the range that you and your health care provider have determined acceptable for you.  Keep your blood pressure level in the range that you and your health care provider have determined acceptable for you.  Eat a well-balanced diet.  Be active every day.  Check your feet every day. SEEK MEDICAL CARE IF:   You have burning, stabbing, or aching pain in the legs or feet.  You are unable to feel pressure or pain in your feet.  You develop problems with digestion such as:  Nausea.  Vomiting.  Bloating.  Constipation.  Diarrhea.  Abdominal pain.  You have difficulty with urination, such as:  Incontinence.  Retention.  You have palpitations.  You develop orthostatic hypotension. When you stand up you may feel:  Dizzy.  Weak.  Faint.  You cannot attain and maintain an erection (in men).  You have vaginal dryness and problems with decreased sexual desire and arousal (in women).  You have severe pain in your thighs, legs, or buttocks.  You have unexplained weight loss. Document  Released: 04/16/2001 Document Revised: 10/08/2012 Document Reviewed: 07/17/2012 [facility] Patient Information 2014 [place], [place].   Do not use heating pads or hot water bottles. They may burn your skin. If you have lost feeling in your feet or legs, you may not know it is happening until it is too late.  Make sure your health care provider performs a complete foot exam at least annually or more often if you have foot problems. Report any cuts, sores, or bruises to your health care provider immediately. SEEK MEDICAL CARE IF:   You have an injury that is not healing.  You have cuts or breaks in the skin.  You have an ingrown nail.  You notice redness on your legs or feet.  You feel burning or tingling in your legs or feet.  You have pain or cramps in your legs and feet.  Your legs or  feet are numb.  Your feet always feel cold. SEEK IMMEDIATE MEDICAL CARE IF:   There is increasing redness, swelling, or pain in or around a wound.  There is a red line that goes up your leg.  Pus is coming from a wound.  You develop a fever or as directed by your health care provider.  You notice a bad smell coming from an ulcer or wound. Document Released: 02/03/2000 Document Revised: 10/08/2012 Document Reviewed: 07/15/2012 [facility] Patient Information 2014 [place], [place].    Recommended to possibly help with diabetic neuropathy is to take a daily adults or senior adult multivitamin such as a [employer], quantity or any [employer] were [employer] similar to that. Look for multivitamins with B complex and folic acid Try taking his vitamins daily for Lisa 3 months duration to see if any improvements occur.

## 2013-02-24 ENCOUNTER — Other Ambulatory Visit: Payer: Self-pay | Admitting: Internal Medicine

## 2013-02-24 NOTE — Telephone Encounter (Signed)
Ranitidine refilled per protocol. JG//CMA

## 2013-03-10 ENCOUNTER — Other Ambulatory Visit: Payer: Self-pay | Admitting: *Deleted

## 2013-03-10 ENCOUNTER — Telehealth: Payer: Self-pay | Admitting: Internal Medicine

## 2013-03-10 MED ORDER — SIMVASTATIN 40 MG PO TABS: ORAL_TABLET | ORAL | Status: DC

## 2013-03-10 NOTE — Telephone Encounter (Signed)
Simvastatin e-scribed to pharmacy. JG//CMA

## 2013-03-10 NOTE — Telephone Encounter (Signed)
Patient states that he received a letter from [facility] today letting him know that [facility] has been unsucessful with getting refills approved at our office for his Simvastatin rx. Patient wants to know if there is an issue with him getting this rx refilled. Please advise.

## 2013-03-15 ENCOUNTER — Other Ambulatory Visit: Payer: Self-pay | Admitting: Internal Medicine

## 2013-03-16 NOTE — Telephone Encounter (Signed)
Allopurinol refilled per protocol. JG//CMA 

## 2013-03-20 ENCOUNTER — Ambulatory Visit: Payer: Medicare Other | Admitting: Cardiovascular Disease

## 2013-03-20 ENCOUNTER — Ambulatory Visit (INDEPENDENT_AMBULATORY_CARE_PROVIDER_SITE_OTHER): Payer: Medicare Other | Admitting: Cardiovascular Disease

## 2013-03-20 ENCOUNTER — Encounter: Payer: Self-pay | Admitting: Cardiovascular Disease

## 2013-03-20 VITALS — BP 131/49 | HR 55 | Ht 71.0 in | Wt 181.0 lb

## 2013-03-20 DIAGNOSIS — E785 Hyperlipidemia, unspecified: Secondary | ICD-10-CM

## 2013-03-20 DIAGNOSIS — I10 Essential (primary) hypertension: Secondary | ICD-10-CM

## 2013-03-20 DIAGNOSIS — I2510 Atherosclerotic heart disease of native coronary artery without angina pectoris: Secondary | ICD-10-CM

## 2013-03-20 NOTE — Patient Instructions (Signed)
Your physician wants you to follow-up in:  12 months.  You will receive a reminder letter in the mail two months in advance. If you don't receive a letter, please call our office to schedule the follow-up appointment.   

## 2013-03-20 NOTE — Progress Notes (Signed)
History of Present Illness: 80 yo AAM with h/o HTN, hyperlipidemia, DM and CAD here today for cardiac follow up. He was admitted to [facility] January 2012 with chest pain and ruled in for an MI with cardiac enzymes. He underwent cardiac cath on 02/28/10 demonstrating triple vessel CAD. 3V CABG on 03/03/10 per Dr. Prescott Gum. AAA followed in [facility]. Echo July 2012 with normal LV size and function, mild MR.   He is here today for follow up. He has been doing well. No chest pain, palpitations, dizziness, near syncope or syncope. He has been taking all of his medications. His blood pressure has been well controlled at home.   Primary Care Physician: Dr. Linna Darner  Last Lipid Profile:Lipid Panel     Component Value Date/Time   CHOL 139 10/29/2012 1005   TRIG 89.0 10/29/2012 1005   HDL 43.90 10/29/2012 1005   CHOLHDL 3 10/29/2012 1005   VLDL 17.8 10/29/2012 1005   LDLCALC 77 10/29/2012 1005    Past Medical History  Diagnosis Date  . CAD (coronary artery disease)     NSTEMI 02/2010;  s/p CABG 1/12 (L-LAD, SOM1, S-PDA); echo in 08/2010: Mild LVH, EF 12%, grade 2 diastolic dysfunction, mild MR, mild LAE, mildly reduced RV function, mild RAE, PASP 42  . Diabetes mellitus 2000    adult onset  . Gout   . ED (erectile dysfunction)   . Hyperlipidemia   . Colon polyps   . Macular degeneration   . AAA (abdominal aortic aneurysm)   . Asthma 1976  . HTN (hypertension)   . COPD (chronic obstructive pulmonary disease)   . Myocardial infarction 1 -13-2012  . Neuropathy     Past Surgical History  Procedure Laterality Date  . Incision and drainage perirectal abscess    . Colonoscopy      neg 2009, due 2014  . Inguinal hernia repair    . Cataract left eye    . 3 vessel cabg  03/03/2010  . Coronary artery bypass graft  2012    X3    Current Outpatient Prescriptions  Medication Sig Dispense Refill  . ACCU-CHEK COMPACT STRIPS test strip TEST BLOOD SUGAR ONCE DAILY  100 each  1  . allopurinol  (ZYLOPRIM) 100 MG tablet TAKE 1 TABLET DAILY  90 tablet  1  . Aspirin (ADULT ASPIRIN LOW STRENGTH) 81 MG EC tablet Take 81 mg by mouth daily.        . capsaicin (ZOSTRIX) 0.025 % cream Apply 1 application topically as needed.      . capsicum oleoresin (TRIXAICIN) 0.025 % cream Apply 1 application topically as needed.      . cetirizine (ZYRTEC) 10 MG tablet Take 10 mg by mouth daily.      . cholecalciferol (VITAMIN D) 1000 UNITS tablet 2 ;000 units daily      . enalapril (VASOTEC) 10 MG tablet TAKE ONE AND ONE-HALF TABLETS DAILY  135 tablet  1  . fluticasone (FLONASE) 50 MCG/ACT nasal spray 1 spray in each nostril twice a day as needed. Use the "crossover" technique as discussed  16 g  5  . metoprolol succinate (TOPROL-XL) 25 MG 24 hr tablet TAKE ONE-HALF (1/2) TABLET DAILY  45 tablet  0  . Multiple Vitamins-Minerals (MULTIVITAL PO) Take by mouth daily.      . ranitidine (ZANTAC) 150 MG tablet TAKE 1 TABLET TWICE A DAY  180 tablet  1  . simvastatin (ZOCOR) 40 MG tablet TAKE 1 TABLET BY  MOUTH AT BEDTIME  90 tablet  1  . temazepam (RESTORIL) 7.5 MG capsule Take 7.5 mg by mouth at bedtime as needed.      . triamcinolone cream (KENALOG) 0.1 % Bid prn  240 g  2   No current facility-administered medications for this visit.    Allergies  Allergen Reactions  . Meperidine Hcl     Tongue swelling Because of a history of documented adverse serious drug reaction;Medi Alert bracelet  is recommended  . Zolpidem Tartrate     REACTION: Crazy dreams    History   Social History  . Marital Status: Widowed    Spouse Name: N/A    Number of Children: N/A  . Years of Education: N/A   Occupational History  . RETIRED    Social History Main Topics  . Smoking status: Former Smoker    Types: Cigarettes    Quit date: 02/20/2003  . Smokeless tobacco: Never Used  . Alcohol Use: 3.6 oz/week    3 Glasses of wine, 3 Shots of liquor per week     Comment:  1-3 drinks a week  . Drug Use: No  . Sexual  Activity: Not on file   Other Topics Concern  . Not on file   Social History Narrative   Quit smoking in 1998. 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     Family History  Problem Relation Age of Onset  . Heart failure Mother   . Heart disease Mother     CHF  . Heart attack Father   . Heart disease Father 61    MI  . Heart attack Sister 17  . Heart disease Sister 43    MI  . Diabetes Sister   . Hyperlipidemia Sister   . Heart attack Brother 34  . Diabetes Brother   . Heart disease Brother 12    MI  . Cancer Brother   . Deep vein thrombosis Daughter   . Other Son     varicose veins  . Colon cancer Neg Hx     Review of Systems:  As stated in the HPI and otherwise negative.   BP 131/49  Pulse 55  Ht 5\' 11"  (1.803 m)  Wt 181 lb (82.101 kg)  BMI 25.26 kg/m2  Physical Examination: General: Well developed, well nourished, NAD HEENT: OP clear, mucus membranes moist SKIN: warm, dry. No rashes. Neuro: No focal deficits Musculoskeletal: Muscle strength 5/5 all ext Psychiatric: Mood and affect normal Neck: No JVD, no carotid bruits, no thyromegaly, no lymphadenopathy. Lungs:Clear bilaterally, no wheezes, rhonci, crackles Cardiovascular: Regular rate and rhythm. No murmurs, gallops or rubs. Abdomen:Soft. Bowel sounds present. Non-tender.  Extremities: No lower extremity edema. Pulses are 2 + in the bilateral DP/PT.  Echo July 2012:  Left ventricle: The cavity size was normal. Wall thickness was increased in a pattern of mild LVH. Septal bounce consistent with prior cardiac surgery. Systolic function was normal. The estimated ejection fraction was 55%. Wall motion was normal; there were no regional wall motion abnormalities. Features are consistent with a pseudonormal left ventricular filling pattern, with concomitant abnormal relaxation and increased filling pressure (grade 2 diastolic dysfunction). E/medial e' > 15 suggests LV end diastolic pressure at least 20  mmHg. Septal thickness:18mm (ED, 2D). - Aortic valve: There was no stenosis. - Mitral valve: Mildly calcified annulus. Mild regurgitation. - Left atrium: The atrium was mildly dilated. - Right ventricle: The cavity size was normal. Systolic function was mildly reduced. - Right atrium: The  atrium was mildly dilated. - Tricuspid valve: Peak RV-RA gradient: 86mm Hg (S). - Pulmonary arteries: PA peak pressure: 68mm Hg (S). - Systemic veins: IVC measured 1.8 cm with some respirophasic variation, suggesting RA pressure 10 mmHg. Impressions:  - Normal LV size with mild LV hypertrophy. Septal bounce consistent with prior cardiac surgery. EF 55%. Moderate diastolic dysfunction with evidence for elevated LV filling pressure. Normal RV size with mildly decreased systolic function. Mild pulmonary hypertension. Mild MR.  EKG: Sinus brady, rate 55 bpm. 1st degree AV block.   Assessment and Plan:   1. CAD: Doing well s/p CABG in January 2012. He is on good medical therapy. He is tolerating all meds. BP is well controlled at home. Lipids are at goal. No changes today.   2. HTN: BP is well controlled. No changes.   3. Hyperlipidemia: He is on a statin. Lipids are well controlled.

## 2013-04-08 ENCOUNTER — Other Ambulatory Visit: Payer: Self-pay | Admitting: Cardiovascular Disease

## 2013-04-26 ENCOUNTER — Other Ambulatory Visit: Payer: Self-pay | Admitting: Internal Medicine

## 2013-04-28 ENCOUNTER — Encounter: Payer: Self-pay | Admitting: Internal Medicine

## 2013-04-28 ENCOUNTER — Ambulatory Visit (INDEPENDENT_AMBULATORY_CARE_PROVIDER_SITE_OTHER): Payer: Medicare Other | Admitting: Internal Medicine

## 2013-04-28 VITALS — BP 140/70 | HR 57 | Temp 97.3°F | Resp 15 | Ht 70.0 in | Wt 181.8 lb

## 2013-04-28 DIAGNOSIS — Z23 Encounter for immunization: Secondary | ICD-10-CM

## 2013-04-28 DIAGNOSIS — I10 Essential (primary) hypertension: Secondary | ICD-10-CM

## 2013-04-28 DIAGNOSIS — Z0000 Encounter for general adult medical examination without abnormal findings: Secondary | ICD-10-CM

## 2013-04-28 DIAGNOSIS — R7303 Prediabetes: Secondary | ICD-10-CM

## 2013-04-28 DIAGNOSIS — R7309 Other abnormal glucose: Secondary | ICD-10-CM

## 2013-04-28 DIAGNOSIS — E785 Hyperlipidemia, unspecified: Secondary | ICD-10-CM

## 2013-04-28 NOTE — Progress Notes (Signed)
Subjective:    Patient ID: [name], male    DOB: [birth year], 80 y.o.   MRN: [id number]  HPI  Medicare Wellness Visit: Psychosocial and medical history were reviewed as required by Medicare (history related to abuse, antisocial behavior , firearm risk). Social history: Caffeine: 1 cup /day  , Alcohol:2-3 shots on weekend , Tobacco ZSW:FUXN 2008 Exercise:5X/week as walking  Personal safety/fall risk:no Limitations of activities of daily living:no Seatbelt/ smoke alarm use:yes Healthcare Power of Attorney/Living Will status: UTD Ophthalmologic exam status:UTD Hearing evaluation status:not current Orientation: Oriented X 3 Memory and recall: good Math testing: good Depression/anxiety assessment: no Foreign travel history: Last time out of the [place] was a visit to the [place] in [place] status for influenza/pneumonia/ shingles /tetanus:UTD Transfusion history: None Preventive health care maintenance status: Colonoscopy as per protocol/standard care: UTD Dental care: Dentures Chart reviewed and updated. Active issues reviewed and addressed as documented below.    Review of Systems HYPERTENSION: Disease Monitoring: Blood pressure range/ average : 136-7/67 Medication Compliance: yes  PRE Diabetes :  FBS range/average:123.55 Highest 2 hr post meal glucose:<160 Medication compliance:no meds Ophthamology care:no retinopathy 8/14 Podiatry care:UTD  HYPERLIPIDEMIA: Disease Monitoring: Medication Compliance:   Labs were performed at the [facility] 03/06/13. A1c was prediabetic at 6.3%. LDL was 65, HDL 49, triglycerides 55. Urine microalbumin was minimally elevated at 2.44 with normals less than 2.4. Renal function revealed a creatinine of 1.4, high normal. GFR was 59, slightly reduced.  Chest pain, palpitations: No     Dyspnea:Yes Edema: No Claudication: No Lightheadedness,Syncope: No Weight gain/loss: No Polyuria/phagia/dipsia: Gets up to urinate 2-3 times nightly    Blurred vision /diplopia/lossof vision: Limb numbness/tingling/burning: No Non healing skin lesions:No Abd pain, bowel changes: No  Myalgias: No Memory loss:No       Objective:   Physical Exam Gen.: Healthy and well-nourished in appearance. Alert, appropriate and cooperative throughout exam. Appears younger than stated age  Head: Normocephalic without obvious abnormalities; pattern alopecia  Eyes: No corneal or conjunctival inflammation noted. Pupils equal round reactive to light and accommodation. Extraocular motion intact. Minimal pterygia bilatEars: External  ear exam reveals no significant lesions or deformities. Canals clear .TMs normal. Hearing is grossly decreased on R Nose: External nasal exam reveals no deformity or inflammation. Nasal mucosa are pink and moist. No lesions or exudates noted.   Mouth: Oral mucosa and oropharynx reveal no lesions or exudates. dentures in good repair. Neck: No deformities, masses, or tenderness noted. Range of motion decreased. Thyroid normal Lungs: Normal respiratory effort; chest expands symmetrically. Lungs are clear to auscultation without rales, wheezes, or increased work of breathing. Heart:Sounds slightly distant. Slow rate and regular  rhythm. Normal S1 and S2. No gallop, click, or rub. No murmur. Abdomen: Bowel sounds normal; abdomen soft and nontender. No masses, organomegaly or hernias noted.Aorta palpable Genitalia: as per Urology                                  Musculoskeletal/extremities: No deformity or scoliosis noted of  the thoracic or lumbar spine.   No clubbing, cyanosis, edema, or significant extremity  deformity noted. Range of motion normal .Tone & strength normal. Hand joints normal Fingernail / toenail health good. Able to lie down & sit up w/o help. Negative SLR bilaterally Vascular: Carotid, radial artery, dorsalis pedis and  posterior tibial pulses are full and equal. No bruits present. Neurologic: Alert and oriented x3.  Deep tendon reflexes symmetrical and normal.  Gait normal  including Skin: Intact without suspicious lesions or rashes. Lymph: No cervical, axillary lymphadenopathy present. Psych: Mood and affect are normal. Normally interactive                                                                                        Assessment & Plan:  #1 comprehensive physical exam; no acute findings  Plan: see Orders  & Recommendations

## 2013-04-28 NOTE — Progress Notes (Signed)
Pre visit review using our clinic review tool, if applicable. No additional management support is needed unless otherwise documented below in the visit note. 

## 2013-04-28 NOTE — Patient Instructions (Signed)
Minimal Blood Pressure Goal= AVERAGE < 140/90;  Ideal is an AVERAGE < 135/85 to protect the aorta. This AVERAGE should be calculated from @ least 5-7 BP readings taken @ different times of day on different days of week. You should not respond to isolated BP readings , but rather the AVERAGE for that week .Please bring your  blood pressure cuff to office visits to verify that it is reliable.It  can also be checked against the blood pressure device at the pharmacy.

## 2013-04-29 ENCOUNTER — Encounter: Payer: Self-pay | Admitting: Internal Medicine

## 2013-05-28 ENCOUNTER — Other Ambulatory Visit: Payer: Self-pay

## 2013-07-06 ENCOUNTER — Encounter: Payer: Self-pay | Admitting: Family

## 2013-07-07 ENCOUNTER — Ambulatory Visit (HOSPITAL_COMMUNITY)
Admission: RE | Admit: 2013-07-07 | Discharge: 2013-07-07 | Disposition: A | Discharge status: Home / Self Care | Payer: Medicare Other | Source: Ambulatory Visit | Attending: Family | Admitting: Family

## 2013-07-07 ENCOUNTER — Ambulatory Visit (INDEPENDENT_AMBULATORY_CARE_PROVIDER_SITE_OTHER): Payer: Medicare Other | Admitting: Family

## 2013-07-07 ENCOUNTER — Encounter: Payer: Self-pay | Admitting: Family

## 2013-07-07 VITALS — BP 125/72 | HR 69 | Temp 97.0°F | Ht 70.0 in | Wt 176.0 lb

## 2013-07-07 DIAGNOSIS — I714 Abdominal aortic aneurysm, without rupture, unspecified: Secondary | ICD-10-CM

## 2013-07-07 DIAGNOSIS — Z48812 Encounter for surgical aftercare following surgery on the circulatory system: Secondary | ICD-10-CM

## 2013-07-07 NOTE — Progress Notes (Signed)
[facility] [place]  Established Abdominal Aortic Aneurysm  History of Present Illness  Brian [name] is a 80 y.o. (03-Jun-1931) male patient of Dr. Kellie Simmering who returns for continued followup regarding his abdominal aortic aneurysm which was discovered 1 year ago by Dr. Ignacia Palma on ultrasound exam. At that time it measured 4.2 cm. He does have a remote history of coronary artery bypass grafting but has had no cardiac symptoms. In November, 2014 CT angiogram of the abdomen and pelvis demonstartes that the aneurysm does appear to be a good candidate for stent grafting if ever necessary. Maximum diameter then was between 4.6 and 4.7 cm-larger than the previous measurement one year ago which was by duplex scanning   The patient does not have back or abdominal pain.   The patient denies claudication in legs with walking, denies non healing wounds. The patient denies history of stroke or TIA symptoms.  Pt Diabetic: Yes, states last A1C was 6.3, in control Pt smoker: former smoker, quit in 2005  Past Medical History  Diagnosis Date  . CAD (coronary artery disease)     NSTEMI 02/2010;  s/p CABG 1/12 (L-LAD, SOM1, S-PDA); echo in 08/2010: Mild LVH, EF 92%, grade 2 diastolic dysfunction, mild MR, mild LAE, mildly reduced RV function, mild RAE, PASP 42  . Diabetes mellitus 2000    adult onset  . Gout   . ED (erectile dysfunction)   . Hyperlipidemia   . Colon polyps   . Macular degeneration   . AAA (abdominal aortic aneurysm)   . Asthma 1976  . HTN (hypertension)   . COPD (chronic obstructive pulmonary disease)   . Myocardial infarction 1 -13-2012  . Neuropathy    Past Surgical History  Procedure Laterality Date  . Incision and drainage perirectal abscess    . Colonoscopy      neg 2009, due 2014  . Inguinal hernia repair    . Cataract left eye    . 3 vessel cabg  03/03/2010  . Coronary artery bypass graft  2012    X3   Social History History   Social  History  . Marital Status: Widowed    Spouse Name: N/A    Number of Children: N/A  . Years of Education: N/A   Occupational History  . RETIRED    Social History Main Topics  . Smoking status: Former Smoker    Types: Cigarettes    Quit date: 02/20/2003  . Smokeless tobacco: Never Used  . Alcohol Use: 3.6 oz/week    3 Glasses of wine, 3 Shots of liquor per week     Comment:  1-3 drinks a week  . Drug Use: No  . Sexual Activity: Not on file   Other Topics Concern  . Not on file   Social History Narrative   Quit smoking in 1998. 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    Family History Family History  Problem Relation Age of Onset  . Heart failure Mother   . Heart disease Mother     CHF  . Heart attack Mother   . Heart attack Father   . Heart disease Father 4    MI  . Heart attack Sister 40  . Heart disease Sister 29    MI  . Diabetes Sister   . Hyperlipidemia Sister   . Hypertension Sister   . Heart attack Brother 69  . Diabetes Brother   . Heart disease Brother 49    MI  .  Cancer Brother   . Hypertension Brother   . Deep vein thrombosis Daughter   . Other Son     varicose veins  . Colon cancer Neg Hx     Current Outpatient Prescriptions on File Prior to Visit  Medication Sig Dispense Refill  . allopurinol (ZYLOPRIM) 100 MG tablet TAKE 1 TABLET DAILY  90 tablet  1  . Aspirin (ADULT ASPIRIN LOW STRENGTH) 81 MG EC tablet Take 81 mg by mouth daily.        . capsaicin (ZOSTRIX) 0.025 % cream Apply 1 application topically as needed.      . capsicum oleoresin (TRIXAICIN) 0.025 % cream Apply 1 application topically as needed.      . cholecalciferol (VITAMIN D) 1000 UNITS tablet 2 ;000 units daily      . enalapril (VASOTEC) 10 MG tablet TAKE ONE AND ONE-HALF TABLETS DAILY  135 tablet  0  . metoprolol succinate (TOPROL-XL) 25 MG 24 hr tablet TAKE ONE-HALF (1/2) TABLET DAILY  45 tablet  3  . Multiple Vitamins-Minerals (MULTIVITAL PO) Take by mouth daily.      .  ranitidine (ZANTAC) 150 MG tablet TAKE 1 TABLET TWICE A DAY  180 tablet  1  . simvastatin (ZOCOR) 40 MG tablet TAKE 1 TABLET BY MOUTH AT BEDTIME  90 tablet  1  . temazepam (RESTORIL) 7.5 MG capsule Take 7.5 mg by mouth at bedtime as needed.      . triamcinolone cream (KENALOG) 0.1 % Bid prn  240 g  2  . ACCU-CHEK COMPACT STRIPS test strip TEST BLOOD SUGAR ONCE DAILY  100 each  1   No current facility-administered medications on file prior to visit.   Allergies  Allergen Reactions  . Meperidine Hcl     Tongue swelling Because of a history of documented adverse serious drug reaction;Medi Alert bracelet  is recommended  . Zolpidem Tartrate     REACTION: Crazy dreams    ROS: See HPI for pertinent positives and negatives.  Physical Examination  Filed Vitals:   07/07/13 0904  BP: 125/72  Pulse: 69  Temp: 97 F (36.1 C)  TempSrc: Oral  Height: 5\' 10"  (1.778 m)  Weight: 176 lb (79.833 kg)  SpO2: 99%   Body mass index is 25.25 kg/(m^2).  General: A&O x 3, WD.  Pulmonary: Sym exp, good air movt, CTAB, no rales, rhonchi, or wheezing.  Cardiac: RRR, Nl S1, S2, no detected murmur.   Carotid Bruits Left Right   Negative Negative   Aorta is not palpable Radial pulses are 2+ palpable and =                          VASCULAR EXAM:                                                                                                         LE Pulses LEFT RIGHT       FEMORAL   palpable   palpable        POPLITEAL  not  palpable   not palpable       POSTERIOR TIBIAL  not palpable   not palpable        DORSALIS PEDIS      ANTERIOR TIBIAL not palpable   palpable      Gastrointestinal: soft, NTND, -G/R, - HSM, - masses, - CVAT B.  Musculoskeletal: M/S 5/5 throughout, Extremities without ischemic changes.  Neurologic: CN 2-12 intact, Pain and light touch intact in extremities are intact, Motor exam as listed above.  Non-Invasive Vascular Imaging  AAA Duplex  (07/07/2013)  Previous size: 4.65 cm (Date: November, 2014 CTA)  Current size:  4.29 cm (Date: 07/07/2013)  ABDOMINAL AORTA DUPLEX EVALUATION    INDICATION: Evaluation of abdominal aorta.    PREVIOUS INTERVENTION(S):     DUPLEX EXAM:     LOCATION DIAMETER AP (cm) DIAMETER TRANSVERSE (cm) VELOCITIES (cm/sec)  Aorta Proximal 2.26 2.34 33  Aorta Mid 3.41 3.61 28  Aorta Distal 4.29 3.85 37  Right Common Iliac Artery 1.32 1.50 62  Left Common Iliac Artery 1.48 1.63 342    Previous max aortic diameter:  4.2 (outside facility) Date: 2013     ADDITIONAL FINDINGS:     IMPRESSION: Patent abdominal aorta with tandem aneurysmal dilatations in the mid and distal segments. Intraluminal thrombus present.    Compared to the previous exam:  No prior exam performed at this facility for comparison.     Medical Decision Making  The patient is a 80 y.o. male who presents with asymptomatic AAA with no increase in size, largest measurement today at 4.29 cm at distal aorta. Patent abdominal aorta with tandem aneurysmal dilatations in the mid and distal segments. Intraluminal thrombus present.   Based on this patient's exam and diagnostic studies, the patient will follow up in 6 months  with the following studies: AAA Duplex.  Consideration for repair of AAA would be made when the size approaches 4.8 or 5.0 cm, growth > 1 cm/yr, and symptomatic status.  I emphasized the importance of maximal medical management including strict control of blood pressure, blood glucose, and lipid levels, antiplatelet agents, obtaining regular exercise, and continued cessation of smoking.   The patient was given information about AAA including signs, symptoms, treatment, and how to minimize the risk of enlargement and rupture of aneurysms.    The patient was advised to call 911 should the patient experience sudden onset abdominal or back pain.   Thank you for allowing [place] to participate in this patient's  care.  Clemon Chambers, RN, MSN, FNP-C [facility] [place] Office: [facility] Physician: Early  07/07/2013, 9:20 AM

## 2013-07-07 NOTE — Patient Instructions (Signed)

## 2013-07-09 ENCOUNTER — Other Ambulatory Visit: Payer: Self-pay | Admitting: Internal Medicine

## 2013-08-04 ENCOUNTER — Ambulatory Visit (INDEPENDENT_AMBULATORY_CARE_PROVIDER_SITE_OTHER): Payer: Medicare Other

## 2013-08-04 VITALS — BP 142/80 | HR 62 | Resp 16 | Ht 71.0 in | Wt 175.5 lb

## 2013-08-04 DIAGNOSIS — R52 Pain, unspecified: Secondary | ICD-10-CM

## 2013-08-04 DIAGNOSIS — E1149 Type 2 diabetes mellitus with other diabetic neurological complication: Secondary | ICD-10-CM

## 2013-08-04 DIAGNOSIS — E1140 Type 2 diabetes mellitus with diabetic neuropathy, unspecified: Secondary | ICD-10-CM

## 2013-08-04 DIAGNOSIS — E1142 Type 2 diabetes mellitus with diabetic polyneuropathy: Secondary | ICD-10-CM

## 2013-08-04 NOTE — Progress Notes (Signed)
   Subjective:    Patient ID: Brian [name], male    DOB: 1932/01/24, 80 y.o.   MRN: [id number]  HPI Comments: Pt states still has the burning pain on and off, but not as severe, and occasionally the shooting pain from the feet up his legs.     Review of Systems no new findings or systemic changes noted     Objective:   Physical Exam Vascular status is intact pedal pulses are palpable DP +2/4 bilateral PT one over 4 bilateral temperature is warm to cool turgor diminished there is no edema rubor pallor noted neurologically epicritic and proprioceptive sensations appear to be intact patient continues to have some slight paresthesia occasions and shooting sensation or tingle but not as intense or severe as it had previously been patient is walking regularly 3 or 4 times a week exercising well he is wearing a larger shoes and no longer having the pain or cramping or discomfort in the toes he was having previously. Cement taking diet supplements with vitamins including folic acid B complex and the sensations have improved no burning or tingling or stinging is noted at this time. No cramping is noted by the patient orthopedic biomechanical exam unremarkable rectus foot ankle mid tarsus subtalar joint motion is normal assessment this time his diabetes with peripheral neuropathy and slight paresthesia which is responding well to vitamin some limitation in shoe changes. Patient been managing his diabetes well       Assessment & Plan:  Assessment diabetes with peripheral neuropathy improved with use of vitamin supplementation and topical treatments patient also monitoring his diet and and blood sugar well discharge to an as-needed basis suggest a 1 year followup in an admit foot checkup if needed. Contact us immediately if any new problems exacerbations difficulties wounds or sores or ulcerations or infections were to occur  Harriet Masson DPM

## 2013-08-04 NOTE — Patient Instructions (Signed)
Diabetes and Foot Care Diabetes may cause you to have problems because of poor blood supply (circulation) to your feet and legs. This may cause the skin on your feet to become thinner, break easier, and heal more slowly. Your skin may become dry, and the skin may peel and crack. You may also have nerve damage in your legs and feet causing decreased feeling in them. You may not notice minor injuries to your feet that could lead to infections or more serious problems. Taking care of your feet is one of the most important things you can do for yourself.  HOME CARE INSTRUCTIONS  Wear shoes at all times, even in the house. Do not go barefoot. Bare feet are easily injured.  Check your feet daily for blisters, cuts, and redness. If you cannot see the bottom of your feet, use a mirror or ask someone for help.  Wash your feet with warm water (do not use hot water) and mild soap. Then pat your feet and the areas between your toes until they are completely dry. Do not soak your feet as this can dry your skin.  Apply a moisturizing lotion or petroleum jelly (that does not contain alcohol and is unscented) to the skin on your feet and to dry, brittle toenails. Do not apply lotion between your toes.  Trim your toenails straight across. Do not dig under them or around the cuticle. File the edges of your nails with an emery board or nail file.  Do not cut corns or calluses or try to remove them with medicine.  Wear clean socks or stockings every day. Make sure they are not too tight. Do not wear knee-high stockings since they may decrease blood flow to your legs.  Wear shoes that fit properly and have enough cushioning. To break in new shoes, wear them for just a few hours a day. This prevents you from injuring your feet. Always look in your shoes before you put them on to be sure there are no objects inside.  Do not cross your legs. This may decrease the blood flow to your feet.  If you find a minor scrape,  cut, or break in the skin on your feet, keep it and the skin around it clean and dry. These areas may be cleansed with mild soap and water. Do not cleanse the area with peroxide, alcohol, or iodine.  When you remove an adhesive bandage, be sure not to damage the skin around it.  If you have a wound, look at it several times a day to make sure it is healing.  Do not use heating pads or hot water bottles. They may burn your skin. If you have lost feeling in your feet or legs, you may not know it is happening until it is too late.  Make sure your health care Brian [name] performs a complete foot exam at least annually or more often if you have foot problems. Report any cuts, sores, or bruises to your health care Brian [name] immediately. SEEK MEDICAL CARE IF:   You have an injury that is not healing.  You have cuts or breaks in the skin.  You have an ingrown nail.  You notice redness on your legs or feet.  You feel burning or tingling in your legs or feet.  You have pain or cramps in your legs and feet.  Your legs or feet are numb.  Your feet always feel cold. SEEK IMMEDIATE MEDICAL CARE IF:   There is increasing redness,   swelling, or pain in or around a wound.  There is a red line that goes up your leg.  Pus is coming from a wound.  You develop a fever or as directed by your health care Brian [name].  You notice a bad smell coming from an ulcer or wound. Document Released: 02/03/2000 Document Revised: 10/08/2012 Document Reviewed: 07/15/2012 Exit[employer] Patient Information 2014 [employer].  

## 2013-08-07 ENCOUNTER — Other Ambulatory Visit: Payer: Self-pay | Admitting: Internal Medicine

## 2013-08-24 ENCOUNTER — Other Ambulatory Visit: Payer: Self-pay | Admitting: Internal Medicine

## 2013-10-05 ENCOUNTER — Encounter: Payer: Self-pay | Admitting: Gastroenterology

## 2013-10-22 LAB — HM DIABETES EYE EXAM

## 2013-10-29 ENCOUNTER — Other Ambulatory Visit (INDEPENDENT_AMBULATORY_CARE_PROVIDER_SITE_OTHER): Payer: Medicare Other

## 2013-10-29 ENCOUNTER — Encounter: Payer: Self-pay | Admitting: Internal Medicine

## 2013-10-29 ENCOUNTER — Ambulatory Visit (INDEPENDENT_AMBULATORY_CARE_PROVIDER_SITE_OTHER): Payer: Medicare Other | Admitting: Internal Medicine

## 2013-10-29 VITALS — BP 130/60 | HR 57 | Temp 97.8°F | Wt 175.2 lb

## 2013-10-29 DIAGNOSIS — R7303 Prediabetes: Secondary | ICD-10-CM

## 2013-10-29 DIAGNOSIS — R7309 Other abnormal glucose: Secondary | ICD-10-CM

## 2013-10-29 DIAGNOSIS — G609 Hereditary and idiopathic neuropathy, unspecified: Secondary | ICD-10-CM

## 2013-10-29 DIAGNOSIS — Z23 Encounter for immunization: Secondary | ICD-10-CM

## 2013-10-29 LAB — MICROALBUMIN / CREATININE URINE RATIO
CREATININE, U: 99.8 mg/dL
Microalb Creat Ratio: 1.3 mg/g (ref 0.0–30.0)
Microalb, Ur: 1.3 mg/dL (ref 0.0–1.9)

## 2013-10-29 LAB — HEMOGLOBIN A1C: Hgb A1c MFr Bld: 6.5 % (ref 4.6–6.5)

## 2013-10-29 NOTE — Patient Instructions (Signed)
Your next office appointment will be determined based upon review of your pending labs. Those instructions will be transmitted to you through My Chart . 

## 2013-10-29 NOTE — Progress Notes (Signed)
Pre visit review using our clinic review tool, if applicable. No additional management support is needed unless otherwise documented below in the visit note. 

## 2013-10-29 NOTE — Progress Notes (Signed)
   Subjective:    Patient ID: Brian [name], male    DOB: 12-25-1931, 80 y.o.   MRN: [id number]  HPI FBS range/ average: 105-150/130 2 hour post meal glucose :<201/ average 162 Hypoglycemia not reported Ophthalmologic exam is current ;no retinopathy present. Foot care current Diet is  low carb Exercise minutes 5-6  times per week as walking 30-40 min w/o symptoms     Review of Systems  Polyuria, polyphagia, polydipsia absent.  There is no blurred vision, double vision, or loss of vision.  Also denied are numbness, tingling, or burning of the extremities.  Occasional shooting pains in feet No nonhealing skin lesions present.  Weight is stable.      Objective:   Physical Exam  Positive or pertinent findings include: Pattern alopecia is present. He appears much younger than stated age Has bilateral pterygia medially. He has complete dentures He has a slow slightly irregular rhythm. Aorta is palpable ; I cannot appreciate an aneurysm clinically. Pedal pulses are decreased but equal.   Appears healthy and well-nourished & in no acute distress No carotid bruits are present.No neck pain distention present at 10 - 15 degrees. Thyroid normal to palpation Chest is clear with no increased work of breathing No renal artery bruits Abdomen soft with no organomegaly or masses. No HJR No clubbing, cyanosis or edema present. No ischemic skin changes are present .  Fingernails healthy  Alert and oriented. Strength, tone, DTRs reflexes normal                Assessment & Plan:  #1pre Diabetes See orders

## 2013-11-30 ENCOUNTER — Other Ambulatory Visit: Payer: Self-pay | Admitting: Internal Medicine

## 2014-01-11 ENCOUNTER — Encounter: Payer: Self-pay | Admitting: Family

## 2014-01-12 ENCOUNTER — Ambulatory Visit (HOSPITAL_COMMUNITY)
Admission: RE | Admit: 2014-01-12 | Discharge: 2014-01-12 | Disposition: A | Discharge status: Home / Self Care | Payer: Medicare Other | Source: Ambulatory Visit | Attending: Family | Admitting: Family

## 2014-01-12 ENCOUNTER — Ambulatory Visit (INDEPENDENT_AMBULATORY_CARE_PROVIDER_SITE_OTHER): Payer: Medicare Other | Admitting: Family

## 2014-01-12 ENCOUNTER — Encounter: Payer: Self-pay | Admitting: Family

## 2014-01-12 VITALS — BP 143/76 | HR 52 | Resp 16 | Ht 71.0 in | Wt 175.0 lb

## 2014-01-12 DIAGNOSIS — I714 Abdominal aortic aneurysm, without rupture, unspecified: Secondary | ICD-10-CM

## 2014-01-12 DIAGNOSIS — Z48812 Encounter for surgical aftercare following surgery on the circulatory system: Secondary | ICD-10-CM

## 2014-01-12 NOTE — Progress Notes (Addendum)
[facility]  Established Abdominal Aortic Aneurysm  History of Present Illness  Brian [name] is a 80 y.o. (01-23-1932) male patient of Dr. Kellie Simmering who returns for continued followup regarding his abdominal aortic aneurysm which was discovered in 2013 by Dr. Ignacia Palma on ultrasound exam. At that time it measured 4.2 cm. He does have a remote history of coronary artery bypass grafting but has had no cardiac symptoms. In November, 2014 CT angiogram of the abdomen and pelvis demonstartes that the aneurysm does appear to be a good candidate for stent grafting if ever necessary. Maximum diameter then was between 4.6 and 4.7 cm-larger than the previous measurement one year ago which was by duplex scanning.  The patient does not have back or abdominal pain.  The patient denies claudication in legs with walking, denies non healing wounds. The patient denies history of stroke or TIA symptoms. He exercises (walks) 35-40 minutes, 5 days/week. The pt states that his PCP is aware of his irregular heart rhythm.  Pt Diabetic: Yes, states diet controlled Pt smoker: former smoker, quit in 2005  Past Medical History  Diagnosis Date  . CAD (coronary artery disease)     NSTEMI 02/2010;  s/p CABG 1/12 (L-LAD, SOM1, S-PDA); echo in 08/2010: Mild LVH, EF 50%, grade 2 diastolic dysfunction, mild MR, mild LAE, mildly reduced RV function, mild RAE, PASP 42  . Diabetes mellitus 2000    adult onset  . Gout   . ED (erectile dysfunction)   . Hyperlipidemia   . Colon polyps   . Macular degeneration   . AAA (abdominal aortic aneurysm)   . Asthma 1976  . HTN (hypertension)   . COPD (chronic obstructive pulmonary disease)   . Myocardial infarction 1 -13-2012  . Neuropathy    Past Surgical History  Procedure Laterality Date  . Incision and drainage perirectal abscess    . Colonoscopy      neg 2009, due 2014  . Inguinal hernia repair    . Cataract left eye    . 3 vessel cabg   03/03/2010  . Coronary artery bypass graft  2012    X3   Social History History   Social History  . Marital Status: Widowed    Spouse Name: N/A    Number of Children: N/A  . Years of Education: N/A   Occupational History  . RETIRED    Social History Main Topics  . Smoking status: Former Smoker    Types: Cigarettes    Quit date: 02/20/2003  . Smokeless tobacco: Never Used  . Alcohol Use: 3.6 oz/week    3 Glasses of wine, 3 Shots of liquor per week     Comment:  1-3 drinks a week  . Drug Use: No  . Sexual Activity: Not on file   Other Topics Concern  . Not on file   Social History Narrative   Quit smoking in 1998. 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    Family History Family History  Problem Relation Age of Onset  . Heart failure Mother   . Heart disease Mother     CHF  . Heart attack Mother   . Heart attack Father   . Heart disease Father 81    MI  . Heart attack Sister 34  . Heart disease Sister 6    MI  . Diabetes Sister   . Hyperlipidemia Sister   . Hypertension Sister   . Heart attack Brother 68  . Diabetes  Brother   . Heart disease Brother 61    MI  . Cancer Brother   . Hypertension Brother   . Deep vein thrombosis Daughter   . Other Son     varicose veins  . Colon cancer Neg Hx     Current Outpatient Prescriptions on File Prior to Visit  Medication Sig Dispense Refill  . ACCU-CHEK COMPACT STRIPS test strip TEST BLOOD SUGAR ONCE DAILY 100 each 1  . allopurinol (ZYLOPRIM) 100 MG tablet TAKE 1 TABLET DAILY 90 tablet 3  . Aspirin (ADULT ASPIRIN LOW STRENGTH) 81 MG EC tablet Take 81 mg by mouth daily.      . capsaicin (ZOSTRIX) 0.025 % cream Apply 1 application topically as needed.    . capsicum oleoresin (TRIXAICIN) 0.025 % cream Apply 1 application topically as needed.    . chlorpheniramine (CHLOR-TRIMETON) 4 MG tablet Take 4 mg by mouth every 4 (four) hours as needed for allergies.    . cholecalciferol (VITAMIN D) 1000 UNITS tablet 2 ;000  units daily    . enalapril (VASOTEC) 10 MG tablet TAKE ONE AND ONE-HALF TABLETS DAILY 135 tablet 0  . metoprolol succinate (TOPROL-XL) 25 MG 24 hr tablet TAKE ONE-HALF (1/2) TABLET DAILY 45 tablet 3  . Multiple Vitamins-Minerals (MULTIVITAL PO) Take by mouth daily.    . ranitidine (ZANTAC) 150 MG tablet TAKE 1 TABLET TWICE A DAY 180 tablet 1  . simvastatin (ZOCOR) 40 MG tablet TAKE 1 TABLET AT BEDTIME 90 tablet 3  . temazepam (RESTORIL) 7.5 MG capsule Take 7.5 mg by mouth at bedtime as needed.    . triamcinolone cream (KENALOG) 0.1 % Bid prn 240 g 2   No current facility-administered medications on file prior to visit.   Allergies  Allergen Reactions  . Meperidine Hcl     Tongue swelling Because of a history of documented adverse serious drug reaction;Medi Alert bracelet  is recommended  . Zolpidem Tartrate     REACTION: Crazy dreams    ROS: See HPI for pertinent positives and negatives.  Physical Examination  Filed Vitals:   01/12/14 0921  BP: 143/76  Pulse: 52  Resp: 16  Height: 5\' 11"  (1.803 m)  Weight: 175 lb (79.379 kg)  SpO2: 99%   Body mass index is 24.42 kg/(m^2).   General: A&O x 3, WD.  Pulmonary: Sym exp, good air movt, CTAB, no rales, rhonchi, or wheezing.  Cardiac: Irregular, Nl S1, S2, no detected murmur.   Carotid Bruits Left Right   Negative Negative  Aorta is not palpable Radial pulses are 2+ palpable and =   VASCULAR EXAM:     LE Pulses LEFT RIGHT   FEMORAL  palpable  palpable    POPLITEAL not palpable  not palpable   POSTERIOR TIBIAL not palpable  not palpable    DORSALIS PEDIS  ANTERIOR TIBIAL palpable  palpable      Gastrointestinal: soft, NTND, -G/R, - HSM, - masses palpated, - CVAT B.  Musculoskeletal: M/S 5/5 throughout,  Extremities without ischemic changes.  Neurologic: CN 2-12 intact, Pain and light touch intact in extremities are intact, Motor exam as listed above.     Non-Invasive Vascular Imaging  AAA Duplex (01/12/2014)  Previous size: 4.29 cm (Date: 07/07/13)  Current size:  4.42 cm (Date: 01/12/2014)  Medical Decision Making  The patient is a 80 y.o. male who presents with asymptomatic AAA stable in size compared to 2014 CT abdomen.   Based on this patient's exam and diagnostic studies, the  patient will follow up in 6 months  with the following studies: AAA Duplex.  Consideration for repair of AAA would be made when the size approaches 4.8 or 5.0 cm, growth > 1 cm/yr, and symptomatic status.  I emphasized the importance of maximal medical management including strict control of blood pressure, blood glucose, and lipid levels, antiplatelet agents, obtaining regular exercise, and continued  cessation of smoking.   The patient was given information about AAA including signs, symptoms, treatment, and how to minimize the risk of enlargement and rupture of aneurysms.    The patient was advised to call 911 should the patient experience sudden onset abdominal or back pain.   Thank you for allowing [place] to participate in this patient's care.  Clemon Chambers, RN, MSN, FNP-C [facility] Office: [phone]  Clinic Physician: Kellie Simmering  01/12/2014, 9:14 AM

## 2014-01-12 NOTE — Patient Instructions (Signed)
Abdominal Aortic Aneurysm An aneurysm is a weakened or damaged part of an artery wall that bulges from the normal force of blood pumping through the body. An abdominal aortic aneurysm is an aneurysm that occurs in the lower part of the aorta, the main artery of the body.  The major concern with an abdominal aortic aneurysm is that it can enlarge and burst (rupture) or blood can flow between the layers of the wall of the aorta through a tear (aorticdissection). Both of these conditions can cause bleeding inside the body and can be life threatening unless diagnosed and treated promptly. CAUSES  The exact cause of an abdominal aortic aneurysm is unknown. Some contributing factors are:   A hardening of the arteries caused by the buildup of fat and other substances in the lining of a blood vessel (arteriosclerosis).  Inflammation of the walls of an artery (arteritis).   Connective tissue diseases, such as Marfan syndrome.   Abdominal trauma.   An infection, such as syphilis or staphylococcus, in the wall of the aorta (infectious aortitis) caused by bacteria. RISK FACTORS  Risk factors that contribute to an abdominal aortic aneurysm may include:  Age older than 60 years.   High blood pressure (hypertension).  Male gender.  Ethnicity (white race).  Obesity.  Family history of aneurysm (first degree relatives only).  Tobacco use. PREVENTION  The following healthy lifestyle habits may help decrease your risk of abdominal aortic aneurysm:  Quitting smoking. Smoking can raise your blood pressure and cause arteriosclerosis.  Limiting or avoiding alcohol.  Keeping your blood pressure, blood sugar level, and cholesterol levels within normal limits.  Decreasing your salt intake. In somepeople, too much salt can raise blood pressure and increase your risk of abdominal aortic aneurysm.  Eating a diet low in saturated fats and cholesterol.  Increasing your fiber intake by including  whole grains, vegetables, and fruits in your diet. Eating these foods may help lower blood pressure.  Maintaining a healthy weight.  Staying physically active and exercising regularly. SYMPTOMS  The symptoms of abdominal aortic aneurysm may vary depending on the size and rate of growth of the aneurysm.Most grow slowly and do not have any symptoms. When symptoms do occur, they may include:  Pain (abdomen, side, lower back, or groin). The pain may vary in intensity. A sudden onset of severe pain may indicate that the aneurysm has ruptured.  Feeling full after eating only small amounts of food.  Nausea or vomiting or both.  Feeling a pulsating lump in the abdomen.  Feeling faint or passing out. DIAGNOSIS  Since most unruptured abdominal aortic aneurysms have no symptoms, they are often discovered during diagnostic exams for other conditions. An aneurysm may be found during the following procedures:  Ultrasonography (A one-time screening for abdominal aortic aneurysm by ultrasonography is also recommended for all men aged 65-75 years who have ever smoked).  X-ray exams.  A computed tomography (CT).  Magnetic resonance imaging (MRI).  Angiography or arteriography. TREATMENT  Treatment of an abdominal aortic aneurysm depends on the size of your aneurysm, your age, and risk factors for rupture. Medication to control blood pressure and pain may be used to manage aneurysms smaller than 6 cm. Regular monitoring for enlargement may be recommended by your caregiver if:  The aneurysm is 3-4 cm in size (an annual ultrasonography may be recommended).  The aneurysm is 4-4.5 cm in size (an ultrasonography every 6 months may be recommended).  The aneurysm is larger than 4.5 cm in   size (your caregiver may ask that you be examined by a vascular surgeon). If your aneurysm is larger than 6 cm, surgical repair may be recommended. There are two main methods for repair of an aneurysm:   Endovascular  repair (a minimally invasive surgery). This is done most often.  Open repair. This method is used if an endovascular repair is not possible. Document Released: 11/15/2004 Document Revised: 06/02/2012 Document Reviewed: 03/07/2012 [employer] Patient Information 2015 [employer]. This information is not intended to replace advice given to you by your health care provider. Make sure you discuss any questions you have with your health care provider.  

## 2014-01-13 NOTE — Addendum Note (Signed)
Addended by: Dorthula Rue L on: 01/13/2014 12:50 PM   Modules accepted: Orders

## 2014-02-07 ENCOUNTER — Other Ambulatory Visit: Payer: Self-pay | Admitting: Internal Medicine

## 2014-02-08 NOTE — Telephone Encounter (Signed)
I do not see this medication on current medication list

## 2014-02-08 NOTE — Telephone Encounter (Signed)
Now OTC; Rx if his insurance will pay for Rx rather than same OTC drug

## 2014-02-10 ENCOUNTER — Other Ambulatory Visit: Payer: Self-pay | Admitting: Internal Medicine

## 2014-02-25 ENCOUNTER — Other Ambulatory Visit: Payer: Self-pay | Admitting: Cardiovascular Disease

## 2014-03-24 ENCOUNTER — Ambulatory Visit (INDEPENDENT_AMBULATORY_CARE_PROVIDER_SITE_OTHER): Payer: Medicare Other | Admitting: Cardiovascular Disease

## 2014-03-24 ENCOUNTER — Encounter: Payer: Self-pay | Admitting: Cardiovascular Disease

## 2014-03-24 VITALS — BP 130/60 | HR 67 | Ht 70.0 in | Wt 176.1 lb

## 2014-03-24 DIAGNOSIS — I10 Essential (primary) hypertension: Secondary | ICD-10-CM | POA: Diagnosis not present

## 2014-03-24 DIAGNOSIS — I25810 Atherosclerosis of coronary artery bypass graft(s) without angina pectoris: Secondary | ICD-10-CM

## 2014-03-24 DIAGNOSIS — E785 Hyperlipidemia, unspecified: Secondary | ICD-10-CM

## 2014-03-24 NOTE — Patient Instructions (Signed)
Your physician wants you to follow-up in:  12 months.  You will receive a reminder letter in the mail two months in advance. If you don't receive a letter, please call our office to schedule the follow-up appointment.   

## 2014-03-24 NOTE — Progress Notes (Signed)
History of Present Illness: 81 yo AAM with h/o HTN, hyperlipidemia, DM and CAD here today for cardiac follow up. He was admitted to [facility] January 2012 with a NSTEMI. He underwent cardiac cath on 02/28/10 demonstrating triple vessel CAD. 3V CABG on 03/03/10 per Dr. Prescott Gum. AAA followed in [facility], last seen there November 2015. Echo July 2012 with normal LV size and function, mild MR.   He is here today for follow up. He has been doing well. No chest pain, palpitations, dizziness, near syncope or syncope. He has been taking all of his medications. He has been walking 35-40 minutes 4-5 days per week.    Primary Care Physician: Dr. Linna Darner  Last Lipid Profile:Lipid Panel     Component Value Date/Time   CHOL 139 10/29/2012 1005   TRIG 89.0 10/29/2012 1005   HDL 43.90 10/29/2012 1005   CHOLHDL 3 10/29/2012 1005   VLDL 17.8 10/29/2012 1005   LDLCALC 77 10/29/2012 1005    Past Medical History  Diagnosis Date  . CAD (coronary artery disease)     NSTEMI 02/2010;  s/p CABG 1/12 (L-LAD, SOM1, S-PDA); echo in 08/2010: Mild LVH, EF 83%, grade 2 diastolic dysfunction, mild MR, mild LAE, mildly reduced RV function, mild RAE, PASP 42  . Diabetes mellitus 2000    adult onset  . Gout   . ED (erectile dysfunction)   . Hyperlipidemia   . Colon polyps   . Macular degeneration   . AAA (abdominal aortic aneurysm)   . Asthma 1976  . HTN (hypertension)   . COPD (chronic obstructive pulmonary disease)   . Myocardial infarction 1 -13-2012  . Neuropathy     Past Surgical History  Procedure Laterality Date  . Incision and drainage perirectal abscess    . Colonoscopy      neg 2009, due 2014  . Inguinal hernia repair    . Cataract left eye    . 3 vessel cabg  03/03/2010  . Coronary artery bypass graft  2012    X3    Current Outpatient Prescriptions  Medication Sig Dispense Refill  . ACCU-CHEK COMPACT STRIPS test strip TEST BLOOD SUGAR ONCE DAILY 100 each 1  . allopurinol (ZYLOPRIM)  100 MG tablet TAKE 1 TABLET DAILY 90 tablet 3  . Aspirin (ADULT ASPIRIN LOW STRENGTH) 81 MG EC tablet Take 81 mg by mouth daily.      . capsaicin (ZOSTRIX) 0.025 % cream Apply 1 application topically as needed.    . capsicum oleoresin (TRIXAICIN) 0.025 % cream Apply 1 application topically as needed.    . chlorpheniramine (CHLOR-TRIMETON) 4 MG tablet Take 4 mg by mouth every 4 (four) hours as needed for allergies.    . cholecalciferol (VITAMIN D) 1000 UNITS tablet 2 ;000 units daily    . enalapril (VASOTEC) 10 MG tablet TAKE ONE AND ONE-HALF TABLETS DAILY 135 tablet 1  . fluticasone (FLONASE) 50 MCG/ACT nasal spray USE 1 SPRAY IN EACH NOSTRIL TWICE A DAY AS NEEDED (USE THE CROSSOVER TECHNIQUE AS DISCUSSED) 48 g 3  . Magnesium Oxide 420 MG TABS Take 420 mg by mouth daily. 420 mg by mouth daily    . metoprolol succinate (TOPROL-XL) 25 MG 24 hr tablet TAKE ONE-HALF (1/2) TABLET DAILY 45 tablet 0  . Multiple Vitamins-Minerals (MULTIVITAL PO) Take by mouth daily.    . ranitidine (ZANTAC) 150 MG tablet TAKE 1 TABLET TWICE A DAY 180 tablet 1  . simvastatin (ZOCOR) 40 MG tablet TAKE 1  TABLET AT BEDTIME 90 tablet 3  . temazepam (RESTORIL) 7.5 MG capsule Take 7.5 mg by mouth at bedtime as needed.    . triamcinolone cream (KENALOG) 0.1 % Bid prn (Patient taking differently: Apply 1 application topically 2 (two) times daily. Bid prn apply to skin as needed for inflammation) 240 g 2   No current facility-administered medications for this visit.    Allergies  Allergen Reactions  . Meperidine Hcl     Tongue swelling Because of a history of documented adverse serious drug reaction;Medi Alert bracelet  is recommended  . Zolpidem Tartrate     REACTION: Crazy dreams    History   Social History  . Marital Status: Widowed    Spouse Name: N/A    Number of Children: N/A  . Years of Education: N/A   Occupational History  . RETIRED    Social History Main Topics  . Smoking status: Former Smoker     Types: Cigarettes    Quit date: 02/20/2003  . Smokeless tobacco: Never Used  . Alcohol Use: 3.6 oz/week    3 Glasses of wine, 3 Shots of liquor per week     Comment:  1-3 drinks a week  . Drug Use: No  . Sexual Activity: Not on file   Other Topics Concern  . Not on file   Social History Narrative   Quit smoking in 1998. 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     Family History  Problem Relation Age of Onset  . Heart failure Mother   . Heart disease Mother     CHF  . Heart attack Mother   . Hypertension Mother   . Heart attack Father   . Heart disease Father 80    MI  . Hypertension Father   . Heart attack Sister 20  . Heart disease Sister 44    MI  . Diabetes Sister   . Hyperlipidemia Sister   . Hypertension Sister   . Varicose Veins Sister   . Heart attack Brother 42  . Diabetes Brother   . Heart disease Brother 54    MI  . Cancer Brother   . Hypertension Brother   . Deep vein thrombosis Daughter   . Other Son     varicose veins  . Colon cancer Neg Hx     Review of Systems:  As stated in the HPI and otherwise negative.   BP 130/60 mmHg  Pulse 67  Ht 5\' 10"  (1.778 m)  Wt 176 lb 1.9 oz (79.888 kg)  BMI 25.27 kg/m2  SpO2 99%  Physical Examination: General: Well developed, well nourished, NAD HEENT: OP clear, mucus membranes moist SKIN: warm, dry. No rashes. Neuro: No focal deficits Musculoskeletal: Muscle strength 5/5 all ext Psychiatric: Mood and affect normal Neck: No JVD, no carotid bruits, no thyromegaly, no lymphadenopathy. Lungs:Clear bilaterally, no wheezes, rhonci, crackles Cardiovascular: Regular rate and rhythm. No murmurs, gallops or rubs. Abdomen:Soft. Bowel sounds present. Non-tender.  Extremities: No lower extremity edema. Pulses are 2 + in the bilateral DP/PT.  Echo July 2012:  Left ventricle: The cavity size was normal. Wall thickness was increased in a pattern of mild LVH. Septal bounce consistent with prior cardiac surgery.  Systolic function was normal. The estimated ejection fraction was 55%. Wall motion was normal; there were no regional wall motion abnormalities. Features are consistent with a pseudonormal left ventricular filling pattern, with concomitant abnormal relaxation and increased filling pressure (grade 2 diastolic dysfunction). E/medial e' > 15 suggests  LV end diastolic pressure at least 20 mmHg. Septal thickness:49mm (ED, 2D). - Aortic valve: There was no stenosis. - Mitral valve: Mildly calcified annulus. Mild regurgitation. - Left atrium: The atrium was mildly dilated. - Right ventricle: The cavity size was normal. Systolic function was mildly reduced. - Right atrium: The atrium was mildly dilated. - Tricuspid valve: Peak RV-RA gradient: 19mm Hg (S). - Pulmonary arteries: PA peak pressure: 39mm Hg (S). - Systemic veins: IVC measured 1.8 cm with some respirophasic variation, suggesting RA pressure 10 mmHg. Impressions:  - Normal LV size with mild LV hypertrophy. Septal bounce consistent with prior cardiac surgery. EF 55%. Moderate diastolic dysfunction with evidence for elevated LV filling pressure. Normal RV size with mildly decreased systolic function. Mild pulmonary hypertension. Mild MR.  EKG: NSR, rate 67 bpm. 1st degree AV block. PVC  Assessment and Plan:   1. CAD: Doing well s/p CABG in January 2012. He is on good medical therapy. He is tolerating all meds. BP is well controlled at home. Lipids are at goal. No changes today.   2. HTN: BP is well controlled. No changes.   3. Hyperlipidemia: He is on a statin. Lipids are well controlled.

## 2014-03-26 ENCOUNTER — Other Ambulatory Visit: Payer: Self-pay

## 2014-03-26 MED ORDER — RANITIDINE HCL 150 MG PO TABS: 150.0000 mg | ORAL_TABLET | Freq: Two times a day (BID) | ORAL | Status: DC

## 2014-04-20 ENCOUNTER — Ambulatory Visit (INDEPENDENT_AMBULATORY_CARE_PROVIDER_SITE_OTHER): Payer: Medicare Other | Admitting: Internal Medicine

## 2014-04-20 ENCOUNTER — Other Ambulatory Visit (INDEPENDENT_AMBULATORY_CARE_PROVIDER_SITE_OTHER): Payer: Medicare Other

## 2014-04-20 ENCOUNTER — Encounter: Payer: Self-pay | Admitting: Internal Medicine

## 2014-04-20 VITALS — BP 130/68 | HR 64 | Temp 97.7°F | Resp 15 | Ht 70.0 in | Wt 178.8 lb

## 2014-04-20 DIAGNOSIS — R7309 Other abnormal glucose: Secondary | ICD-10-CM | POA: Diagnosis not present

## 2014-04-20 DIAGNOSIS — N289 Disorder of kidney and ureter, unspecified: Secondary | ICD-10-CM | POA: Diagnosis not present

## 2014-04-20 DIAGNOSIS — Z8739 Personal history of other diseases of the musculoskeletal system and connective tissue: Secondary | ICD-10-CM

## 2014-04-20 DIAGNOSIS — Z8639 Personal history of other endocrine, nutritional and metabolic disease: Secondary | ICD-10-CM

## 2014-04-20 DIAGNOSIS — E785 Hyperlipidemia, unspecified: Secondary | ICD-10-CM | POA: Diagnosis not present

## 2014-04-20 DIAGNOSIS — R7303 Prediabetes: Secondary | ICD-10-CM

## 2014-04-20 LAB — URIC ACID: Uric Acid, Serum: 5.4 mg/dL (ref 4.0–7.8)

## 2014-04-20 LAB — TSH: TSH: 1.77 u[IU]/mL (ref 0.35–4.50)

## 2014-04-20 NOTE — Assessment & Plan Note (Addendum)
D/C  Allopurinol if uric acid WNL

## 2014-04-20 NOTE — Patient Instructions (Signed)
  Your next office appointment will be determined based upon review of your pending labs   Those instructions will be transmitted to you through My Chart   Critical values will be called.  Followup as needed for any active or acute issue. Please report any significant change in your symptoms.

## 2014-04-20 NOTE — Assessment & Plan Note (Signed)
Uric acid

## 2014-04-20 NOTE — Progress Notes (Signed)
   Subjective:    Patient ID: Brian [name], male    DOB: Dec 30, 1931, 81 y.o.   MRN: [id number]  HPI The patient is here to assess status of active health conditions.  PMH, FH, & Social History reviewed & updated.   He has been compliant with his medicines without adverse effects. .He exercises 5-6 days per week without cardio pulmonary symptoms except for occasional dyspnea on exertion.  Fasting blood sugar averages 137; glucoses 2 hours after meal less than 170. Ophthalmologic exam revealed no retinopathy in August 2015. Podiatry exam was done in December 2015. He also had diabetic foot testing at the [place].  Pressure average less than 135/85.  Fasting labs 03/02/14 from the [place] were reviewed. Triglycerides 129; HDL 57; LDL 72; A1c 6.4; eosinophil count 5.7%; creatinine 1.5; total protein 8.2 (less than 8.1)   Review of Systems    He has ankle pain @ night; no improvement with Mg++ supplement Rxed by the [facility]. Occasional DOE  All below NEGATIVE: Chest pain, palpitations       Edema Claudication  Lightheadedness,Syncope Weight change Polyuria/phagia/dipsia    Blurred vision /diplopia/lossof vision Limb numbness/tingling/burning Non healing skin lesions Abd pain, bowel changes   Myalgias    Objective:   Physical Exam  Gen.: Pattern alopecia.Healthy  & well-nourished, appropriate and alert, weight Eyes: No lid/conjunctival changes, extraocular motion intact, fundi. Small pterygium bilaterally Neck: Normal range of motion, thyroid Respiratory: No increased work of breathing or abnormal breath sounds Cardiac : regular rhythm, no extra heart sounds, gallop, murmur Abdomen: No organomegaly ,masses, bruits or clinical aortic enlargement. Aortic pulsations palpable Lymph: No lymphadenopathy of the neck or axilla Skin: No rashes, lesions, ulcers or ischemic changes Muscle skeletal: no nail changes; joints Vasc:All pulses equal; DPP decreased Neuro: Normal deep tendon reflexes,  alert & oriented, sensation over feet intact Psych: judgment and insight, mood and affect normal      Assessment & Plan:  See Current Assessment & Plan in Problem List under specific Diagnosis

## 2014-04-20 NOTE — Progress Notes (Signed)
Pre visit review using our clinic review tool, if applicable. No additional management support is needed unless otherwise documented below in the visit note. 

## 2014-04-21 NOTE — Assessment & Plan Note (Signed)
Monitor A1c 6 mos

## 2014-04-21 NOTE — Assessment & Plan Note (Signed)
No change in meds indicated

## 2014-04-28 ENCOUNTER — Encounter: Payer: Self-pay | Admitting: Internal Medicine

## 2014-04-29 ENCOUNTER — Ambulatory Visit: Payer: Medicare Other | Admitting: Internal Medicine

## 2014-05-06 ENCOUNTER — Other Ambulatory Visit: Payer: Self-pay | Admitting: Cardiovascular Disease

## 2014-06-20 DIAGNOSIS — J219 Acute bronchiolitis, unspecified: Secondary | ICD-10-CM

## 2014-06-20 HISTORY — DX: Acute bronchiolitis, unspecified: J21.9

## 2014-07-02 ENCOUNTER — Ambulatory Visit (INDEPENDENT_AMBULATORY_CARE_PROVIDER_SITE_OTHER): Payer: Medicare Other | Admitting: Internal Medicine

## 2014-07-02 ENCOUNTER — Encounter: Payer: Self-pay | Admitting: Internal Medicine

## 2014-07-02 VITALS — BP 122/70 | HR 65 | Temp 98.2°F | Resp 18 | Ht 71.0 in | Wt 174.0 lb

## 2014-07-02 DIAGNOSIS — M609 Myositis, unspecified: Secondary | ICD-10-CM | POA: Diagnosis not present

## 2014-07-02 DIAGNOSIS — M791 Myalgia: Secondary | ICD-10-CM

## 2014-07-02 DIAGNOSIS — IMO0001 Reserved for inherently not codable concepts without codable children: Secondary | ICD-10-CM | POA: Insufficient documentation

## 2014-07-02 MED ORDER — DICLOFENAC SODIUM 1 % TD GEL: 2.0000 g | Freq: Three times a day (TID) | TRANSDERMAL | Status: DC | PRN

## 2014-07-02 NOTE — Progress Notes (Signed)
Pre visit review using our clinic review tool, if applicable. No additional management support is needed unless otherwise documented below in the visit note. 

## 2014-07-02 NOTE — Patient Instructions (Signed)
We will send in a medicine that is a cream with anti-inflammatory medicine in it called voltaren. We have given you a sample to try. You can apply it where the pain is up to 3 times per day.  If you are not feeling better in about 2 weeks call us back and we can try something else.   Muscle Strain A muscle strain is an injury that occurs when a muscle is stretched beyond its normal length. Usually a small number of muscle fibers are torn when this happens. Muscle strain is rated in degrees. First-degree strains have the least amount of muscle fiber tearing and pain. Second-degree and third-degree strains have increasingly more tearing and pain.  Usually, recovery from muscle strain takes 1-2 weeks. Complete healing takes 5-6 weeks.  CAUSES  Muscle strain happens when a sudden, violent force placed on a muscle stretches it too far. This may occur with lifting, sports, or a fall.  RISK FACTORS Muscle strain is especially common in athletes.  SIGNS AND SYMPTOMS At the site of the muscle strain, there may be:  Pain.  Bruising.  Swelling.  Difficulty using the muscle due to pain or lack of normal function. DIAGNOSIS  Your health care provider will perform a physical exam and ask about your medical history. TREATMENT  Often, the best treatment for a muscle strain is resting, icing, and applying cold compresses to the injured area.  HOME CARE INSTRUCTIONS   Use the PRICE method of treatment to promote muscle healing during the first 2-3 days after your injury. The PRICE method involves:  Protecting the muscle from being injured again.  Restricting your activity and resting the injured body part.  Icing your injury. To do this, put ice in a plastic bag. Place a towel between your skin and the bag. Then, apply the ice and leave it on from 15-20 minutes each hour. After the third day, switch to moist heat packs.  Apply compression to the injured area with a splint or elastic bandage. Be  careful not to wrap it too tightly. This may interfere with blood circulation or increase swelling.  Elevate the injured body part above the level of your heart as often as you can.  Only take over-the-counter or prescription medicines for pain, discomfort, or fever as directed by your health care provider.  Warming up prior to exercise helps to prevent future muscle strains. SEEK MEDICAL CARE IF:   You have increasing pain or swelling in the injured area.  You have numbness, tingling, or a significant loss of strength in the injured area. MAKE SURE YOU:   Understand these instructions.  Will watch your condition.  Will get help right away if you are not doing well or get worse. Document Released: 02/05/2005 Document Revised: 11/26/2012 Document Reviewed: 09/04/2012 [facility] Patient Information 2015 [place], [place]. This information is not intended to replace advice given to you by your health care provider. Make sure you discuss any questions you have with your health care provider.

## 2014-07-02 NOTE — Assessment & Plan Note (Signed)
Suspect this is sore muscles from his cough which has since resolved. Given his extensive cardiac history will avoid nsaids. Rx for voltaren gel and can try tylenol prn if needed.

## 2014-07-02 NOTE — Progress Notes (Signed)
   Subjective:    Patient ID: Brian [name], male    DOB: 1932/02/15, 81 y.o.   MRN: [id number]  HPI The patient is an 81 YO man who is coming in for neck pain. He had a cough last week and was coughing quite a bit. After the cough went away he has had pain in the sides of his neck the and tops of his shoulders. No numbness in his hands or arms, no chest pains. No cough or SOB. He tried advil for it and fell asleep because it was so strong. The pain was relieved but back when he awoke. He also tried heat and ice packs which were not effective. Stable to mildly improved over the last few days. Some decreased ROM with his neck from pain.  Review of Systems  Constitutional: Negative for activity change, appetite change and fatigue.  HENT: Negative.   Respiratory: Negative for cough, chest tightness, shortness of breath and wheezing.   Cardiovascular: Negative.   Musculoskeletal: Positive for myalgias. Negative for back pain, arthralgias and neck stiffness.      Objective:   Physical Exam  Constitutional: He appears well-developed and well-nourished.  HENT:  Head: Normocephalic and atraumatic.  Eyes: EOM are normal.  Neck: No JVD present. No thyromegaly present.  Full passive ROM, active ROM limited by pain  Cardiovascular: Normal rate and regular rhythm.   Pulmonary/Chest: Effort normal. No respiratory distress. He has no wheezes. He has no rales.  Abdominal: Soft. Bowel sounds are normal.  Musculoskeletal: He exhibits tenderness.  Kernig negative, tender along the shoulder line, not tender along the spine or in shoulder joint.   Lymphadenopathy:    He has no cervical adenopathy.   Filed Vitals:   07/02/14 1032  BP: 122/70  Pulse: 65  Temp: 98.2 F (36.8 C)  TempSrc: Oral  Resp: 18  Height: '5\' 11"'$  (1.803 m)  Weight: 174 lb (78.926 kg)  SpO2: 99%      Assessment & Plan:

## 2014-07-08 ENCOUNTER — Encounter: Payer: Self-pay | Admitting: Internal Medicine

## 2014-07-08 ENCOUNTER — Ambulatory Visit (INDEPENDENT_AMBULATORY_CARE_PROVIDER_SITE_OTHER)
Admission: RE | Admit: 2014-07-08 | Discharge: 2014-07-08 | Disposition: A | Discharge status: Home / Self Care | Payer: Medicare Other | Source: Ambulatory Visit | Attending: Internal Medicine | Admitting: Internal Medicine

## 2014-07-08 ENCOUNTER — Ambulatory Visit (INDEPENDENT_AMBULATORY_CARE_PROVIDER_SITE_OTHER): Payer: Medicare Other | Admitting: Internal Medicine

## 2014-07-08 VITALS — BP 128/62 | HR 72 | Temp 97.7°F | Resp 16 | Wt 173.0 lb

## 2014-07-08 DIAGNOSIS — R079 Chest pain, unspecified: Secondary | ICD-10-CM | POA: Diagnosis not present

## 2014-07-08 DIAGNOSIS — J209 Acute bronchitis, unspecified: Secondary | ICD-10-CM | POA: Diagnosis not present

## 2014-07-08 DIAGNOSIS — R05 Cough: Secondary | ICD-10-CM | POA: Diagnosis not present

## 2014-07-08 MED ORDER — HYDROCODONE-HOMATROPINE 5-1.5 MG/5ML PO SYRP: 5.0000 mL | ORAL_SOLUTION | Freq: Three times a day (TID) | ORAL | Status: DC | PRN

## 2014-07-08 MED ORDER — AMOXICILLIN-POT CLAVULANATE 875-125 MG PO TABS: 1.0000 | ORAL_TABLET | Freq: Two times a day (BID) | ORAL | Status: DC

## 2014-07-08 NOTE — Patient Instructions (Signed)
  Your next office appointment will be determined based upon review of your pending xrays  Those instructions will be transmitted to you  By My Chart  Critical results will be called.   Followup as needed for any active or acute issue. Please report any significant change in your symptoms.

## 2014-07-08 NOTE — Progress Notes (Signed)
Pre visit review using our clinic review tool, if applicable. No additional management support is needed unless otherwise documented below in the visit note. 

## 2014-07-08 NOTE — Progress Notes (Signed)
   Subjective:    Patient ID: Brian [name], male    DOB: March 08, 1931, 81 y.o.   MRN: [id number]  HPI  His symptoms began 3 weeks ago as sore throat. Within the last week he's developed a cough productive of green/brown sputum. He was seen last week with pain in his neck and upper shoulders related to the cough and was given a topical agent. That has improved but he has some right anterior chest pain with coughing. He's also had some slight wheezing. He's been using Mucinex.  He has no upper respiratory tract infection symptoms.    Review of Systems The cough is not associated with shortness of breath or hemoptysis.  He has no extrinsic symptoms of itchy, watery eyes or sneezing. Frontal headache, facial pain , nasal purulence, dental pain, otic pain or otic discharge denied. No fever , chills or sweats.    Objective:   Physical Exam  General appearance:Adequately nourished; no acute distress or increased work of breathing is present.    Lymphatic: No  lymphadenopathy about the head, neck, or axilla .  Eyes: No conjunctival inflammation or lid edema is present. There is no scleral icterus.  Ears:  External ear exam shows no significant lesions or deformities.  Otoscopic examination reveals clear canals, tympanic membranes are intact bilaterally without bulging, retraction, inflammation or discharge.  Nose:  External nasal examination shows no deformity or inflammation. Nasal mucosa are pink and moist without lesions or exudates No septal dislocation or deviation.No obstruction to airflow.   Oral exam: Complete dentures; lips and gums are healthy appearing.There is no oropharyngeal erythema or exudate .  Neck:  No deformities, thyromegaly, masses, or tenderness noted.   Supple with full range of motion without pain.   Heart:  Normal rate and regular rhythm. S1 and S2 normal without gallop, murmur, click, rub or other extra sounds.   Lungs:Chest clear to auscultation; no wheezes,  rhonchi,rales ,or rubs present. Slight splinting on the right  Extremities:  No cyanosis, edema, or clubbing  noted    Skin: Warm & dry w/o tenting  . No significant lesions or rash.       Assessment & Plan:  #1 acute bronchitis; rule out pneumonia  Plan: See orders and recommendations

## 2014-07-09 ENCOUNTER — Encounter: Payer: Self-pay | Admitting: Family

## 2014-07-13 ENCOUNTER — Ambulatory Visit (HOSPITAL_COMMUNITY)
Admission: RE | Admit: 2014-07-13 | Discharge: 2014-07-13 | Disposition: A | Discharge status: Home / Self Care | Payer: Medicare Other | Source: Ambulatory Visit | Attending: Family | Admitting: Family

## 2014-07-13 ENCOUNTER — Ambulatory Visit (INDEPENDENT_AMBULATORY_CARE_PROVIDER_SITE_OTHER): Payer: Medicare Other | Admitting: Family

## 2014-07-13 ENCOUNTER — Encounter: Payer: Self-pay | Admitting: Family

## 2014-07-13 VITALS — BP 130/63 | HR 52 | Resp 14 | Ht 71.0 in | Wt 170.0 lb

## 2014-07-13 DIAGNOSIS — I714 Abdominal aortic aneurysm, without rupture, unspecified: Secondary | ICD-10-CM

## 2014-07-13 DIAGNOSIS — Z87891 Personal history of nicotine dependence: Secondary | ICD-10-CM | POA: Diagnosis not present

## 2014-07-13 DIAGNOSIS — Z951 Presence of aortocoronary bypass graft: Secondary | ICD-10-CM | POA: Insufficient documentation

## 2014-07-13 DIAGNOSIS — Z48812 Encounter for surgical aftercare following surgery on the circulatory system: Secondary | ICD-10-CM

## 2014-07-13 NOTE — Patient Instructions (Signed)
Abdominal Aortic Aneurysm An aneurysm is a weakened or damaged part of an artery wall that bulges from the normal force of blood pumping through the body. An abdominal aortic aneurysm is an aneurysm that occurs in the lower part of the aorta, the main artery of the body.  The major concern with an abdominal aortic aneurysm is that it can enlarge and burst (rupture) or blood can flow between the layers of the wall of the aorta through a tear (aorticdissection). Both of these conditions can cause bleeding inside the body and can be life threatening unless diagnosed and treated promptly. CAUSES  The exact cause of an abdominal aortic aneurysm is unknown. Some contributing factors are:   A hardening of the arteries caused by the buildup of fat and other substances in the lining of a blood vessel (arteriosclerosis).  Inflammation of the walls of an artery (arteritis).   Connective tissue diseases, such as Marfan syndrome.   Abdominal trauma.   An infection, such as syphilis or staphylococcus, in the wall of the aorta (infectious aortitis) caused by bacteria. RISK FACTORS  Risk factors that contribute to an abdominal aortic aneurysm may include:  Age older than 60 years.   High blood pressure (hypertension).  Male gender.  Ethnicity (white race).  Obesity.  Family history of aneurysm (first degree relatives only).  Tobacco use. PREVENTION  The following healthy lifestyle habits may help decrease your risk of abdominal aortic aneurysm:  Quitting smoking. Smoking can raise your blood pressure and cause arteriosclerosis.  Limiting or avoiding alcohol.  Keeping your blood pressure, blood sugar level, and cholesterol levels within normal limits.  Decreasing your salt intake. In somepeople, too much salt can raise blood pressure and increase your risk of abdominal aortic aneurysm.  Eating a diet low in saturated fats and cholesterol.  Increasing your fiber intake by including  whole grains, vegetables, and fruits in your diet. Eating these foods may help lower blood pressure.  Maintaining a healthy weight.  Staying physically active and exercising regularly. SYMPTOMS  The symptoms of abdominal aortic aneurysm may vary depending on the size and rate of growth of the aneurysm.Most grow slowly and do not have any symptoms. When symptoms do occur, they may include:  Pain (abdomen, side, lower back, or groin). The pain may vary in intensity. A sudden onset of severe pain may indicate that the aneurysm has ruptured.  Feeling full after eating only small amounts of food.  Nausea or vomiting or both.  Feeling a pulsating lump in the abdomen.  Feeling faint or passing out. DIAGNOSIS  Since most unruptured abdominal aortic aneurysms have no symptoms, they are often discovered during diagnostic exams for other conditions. An aneurysm may be found during the following procedures:  Ultrasonography (A one-time screening for abdominal aortic aneurysm by ultrasonography is also recommended for all men aged 65-75 years who have ever smoked).  X-ray exams.  A computed tomography (CT).  Magnetic resonance imaging (MRI).  Angiography or arteriography. TREATMENT  Treatment of an abdominal aortic aneurysm depends on the size of your aneurysm, your age, and risk factors for rupture. Medication to control blood pressure and pain may be used to manage aneurysms smaller than 6 cm. Regular monitoring for enlargement may be recommended by your caregiver if:  The aneurysm is 3-4 cm in size (an annual ultrasonography may be recommended).  The aneurysm is 4-4.5 cm in size (an ultrasonography every 6 months may be recommended).  The aneurysm is larger than 4.5 cm in   size (your caregiver may ask that you be examined by a vascular surgeon). If your aneurysm is larger than 6 cm, surgical repair may be recommended. There are two main methods for repair of an aneurysm:   Endovascular  repair (a minimally invasive surgery). This is done most often.  Open repair. This method is used if an endovascular repair is not possible. Document Released: 11/15/2004 Document Revised: 06/02/2012 Document Reviewed: 03/07/2012 [employer] Patient Information 2015 [employer]. This information is not intended to replace advice given to you by your health care provider. Make sure you discuss any questions you have with your health care provider.  

## 2014-07-13 NOTE — Progress Notes (Signed)
[facility] [place]  Established Abdominal Aortic Aneurysm  History of Present Illness  Brian [name] is a 81 y.o. (26-Jul-1931) male patient of Dr. Kellie Simmering who returns for continued followup regarding his abdominal aortic aneurysm which was discovered in 2013 by Dr. Ignacia Palma on ultrasound exam. At that time it measured 4.2 cm. He does have a remote history of coronary artery bypass grafting but has had no cardiac symptoms. In November, 2014 CT angiogram of the abdomen and pelvis demonstrates that the aneurysm does appear to be a good candidate for stent grafting if ever necessary. Maximum diameter then was between 4.6 and 4.7 cm-larger than the previous measurement the year prior which was by duplex scanning.  The patient does not have back or abdominal pain.  The patient denies claudication in legs with walking, denies non healing wounds. The patient denies history of stroke or TIA symptoms. He exercises (walks) 35-40 minutes, 5 days/week. The pt states that his PCP is aware of his irregular heart rhythm.  Pt Diabetic: Yes, states diet controlled, states last A1C was 6.3 or 6.4 Pt smoker: former smoker, quit in 2005   Past Medical History  Diagnosis Date  . CAD (coronary artery disease)     NSTEMI 02/2010;  s/p CABG 1/12 (L-LAD, SOM1, S-PDA); echo in 08/2010: Mild LVH, EF 58%, grade 2 diastolic dysfunction, mild MR, mild LAE, mildly reduced RV function, mild RAE, PASP 42  . Diabetes mellitus 2000    adult onset  . Gout   . ED (erectile dysfunction)   . Hyperlipidemia   . Colon polyps   . Macular degeneration   . AAA (abdominal aortic aneurysm)   . Asthma 1976  . HTN (hypertension)   . COPD (chronic obstructive pulmonary disease)   . Myocardial infarction 1 -13-2012  . Neuropathy    Past Surgical History  Procedure Laterality Date  . Incision and drainage perirectal abscess    . Colonoscopy      neg 2009  . Inguinal hernia repair    . Cataract  left eye    . 3 vessel cabg  03/03/2010  . Coronary artery bypass graft  2012    X3  . Colonoscopy  2014    5 mm sessile polyp; AVM   Social History History   Social History  . Marital Status: Widowed    Spouse Name: N/A  . Number of Children: N/A  . Years of Education: N/A   Occupational History  . RETIRED    Social History Main Topics  . Smoking status: Former Smoker    Types: Cigarettes    Quit date: 02/20/2003  . Smokeless tobacco: Never Used  . Alcohol Use: 3.6 oz/week    3 Glasses of wine, 3 Shots of liquor per week     Comment:  1-3 drinks a week  . Drug Use: No  . Sexual Activity: Not on file   Other Topics Concern  . Not on file   Social History Narrative   Quit smoking in 1998. 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    Family History Family History  Problem Relation Age of Onset  . Heart failure Mother   . Heart disease Mother     CHF  . Heart attack Mother   . Hypertension Mother   . Heart attack Father   . Heart disease Father 18    MI  . Hypertension Father   . Heart attack Sister 35  . Heart disease Sister 63  MI  . Diabetes Sister   . Hyperlipidemia Sister   . Hypertension Sister   . Varicose Veins Sister   . Heart attack Brother 73  . Diabetes Brother   . Heart disease Brother 31    MI  . Cancer Brother   . Hypertension Brother   . Deep vein thrombosis Daughter   . Other Son     varicose veins  . Colon cancer Neg Hx     Current Outpatient Prescriptions on File Prior to Visit  Medication Sig Dispense Refill  . ACCU-CHEK COMPACT STRIPS test strip TEST BLOOD SUGAR ONCE DAILY 100 each 1  . amoxicillin-clavulanate (AUGMENTIN) 875-125 MG per tablet Take 1 tablet by mouth 2 (two) times daily. 20 tablet 0  . Aspirin (ADULT ASPIRIN LOW STRENGTH) 81 MG EC tablet Take 81 mg by mouth daily.      . capsaicin (ZOSTRIX) 0.025 % cream Apply 1 application topically as needed.    . capsicum oleoresin (TRIXAICIN) 0.025 % cream Apply 1  application topically as needed.    . chlorpheniramine (CHLOR-TRIMETON) 4 MG tablet Take 4 mg by mouth every 4 (four) hours as needed for allergies.    . cholecalciferol (VITAMIN D) 1000 UNITS tablet 2 ;000 units daily    . diclofenac sodium (VOLTAREN) 1 % GEL Apply 2 g topically 3 (three) times daily as needed. 100 g 0  . enalapril (VASOTEC) 10 MG tablet TAKE ONE AND ONE-HALF TABLETS DAILY 135 tablet 1  . fluticasone (FLONASE) 50 MCG/ACT nasal spray USE 1 SPRAY IN EACH NOSTRIL TWICE A DAY AS NEEDED (USE THE CROSSOVER TECHNIQUE AS DISCUSSED) 48 g 3  . guaiFENesin (MUCINEX) 600 MG 12 hr tablet Take by mouth 2 (two) times daily.    Marland Kitchen HYDROcodone-homatropine (HYCODAN) 5-1.5 MG/5ML syrup Take 5 mLs by mouth every 8 (eight) hours as needed for cough. 120 mL 0  . Magnesium Oxide 420 MG TABS Take 420 mg by mouth daily. 420 mg by mouth daily    . metoprolol succinate (TOPROL-XL) 25 MG 24 hr tablet TAKE ONE-HALF (1/2) TABLET DAILY 45 tablet 2  . Multiple Vitamins-Minerals (MULTIVITAL PO) Take by mouth daily.    . ranitidine (ZANTAC) 150 MG tablet Take 1 tablet (150 mg total) by mouth 2 (two) times daily. 180 tablet 1  . simvastatin (ZOCOR) 40 MG tablet TAKE 1 TABLET AT BEDTIME 90 tablet 3  . temazepam (RESTORIL) 7.5 MG capsule Take 7.5 mg by mouth at bedtime as needed.    . triamcinolone cream (KENALOG) 0.1 % Bid prn (Patient taking differently: Apply 1 application topically 2 (two) times daily. Bid prn apply to skin as needed for inflammation) 240 g 2   No current facility-administered medications on file prior to visit.   Allergies  Allergen Reactions  . Meperidine Hcl     Tongue swelling Because of a history of documented adverse serious drug reaction;Medi Alert bracelet  is recommended  . Zolpidem Tartrate     REACTION: Crazy dreams    ROS: See HPI for pertinent positives and negatives.  Physical Examination  Filed Vitals:   07/13/14 0916  BP: 130/63  Pulse: 52  Resp: 14  Height: 5'  11" (1.803 m)  Weight: 170 lb (77.111 kg)  SpO2: 99%   Body mass index is 23.72 kg/(m^2).   General: A&O x 3, WD.  Pulmonary: Sym exp, good air movt, CTAB, no rales, rhonchi, or wheezing.  Cardiac: Irregular, Nl S1, S2, no detected murmur.   Carotid Bruits  Left Right   Negative Negative  Aorta is palpable Radial pulses are 2+ palpable and =   VASCULAR EXAM:     LE Pulses LEFT RIGHT   FEMORAL  palpable  palpable    POPLITEAL not palpable  not palpable   POSTERIOR TIBIAL  palpable  not palpable    DORSALIS PEDIS  ANTERIOR TIBIAL palpable  palpable      Gastrointestinal: soft, NTND, -G/R, - HSM, - masses palpated, - CVAT B.  Musculoskeletal: M/S 5/5 throughout, Extremities without ischemic changes.  Neurologic: CN 2-12 intact, Pain and light touch intact in extremities are intact, Motor exam as listed above.         Non-Invasive Vascular Imaging  AAA Duplex (07/13/2014) ABDOMINAL AORTA DUPLEX EVALUATION    INDICATION: Follow-up abdominal aortic aneurysm     PREVIOUS INTERVENTION(S):     DUPLEX EXAM:     LOCATION DIAMETER AP (cm) DIAMETER TRANSVERSE (cm) VELOCITIES (cm/sec)  Aorta Proximal 2.43 2.42 54  Aorta Mid 3.67 3.65 39  Aorta Distal 4.76 3.59 28  Right Common Iliac Artery 1.46 1.36 93  Left Common Iliac Artery 1.32 1.32 109    Previous max aortic diameter:  4.42cm Date: 01/12/2014  ADDITIONAL FINDINGS:     IMPRESSION: 1. Tandem aneurysm of the infrarenal aorta is observed. The proximal aneurysm appears fusiform and measures 3.67cm x 3.65cm. The distal aneurysm is more saccular and measures 4.76cm. 2. There is a small artery branching from the posterior wall of the aorta at the largest segment of the distal aneurysm with  increased velocities observed (450cm/second). This may be a prominent lumbar artery.    Compared to the previous exam:  Slight increase in distal aneurysm; however, this may be due to saccular nature of the aneurysm and technical variations in measurement.     Medical Decision Making  The patient is a 81 y.o. male who presents with asymptomatic AAA with a slight increase in size to 4.76 cm.   Based on this patient's exam and diagnostic studies, and after discussing with Dr. Kellie Simmering, the patient will follow up in 6 months with the following studies: AAA Duplex and see Dr. Kellie Simmering.  Consideration for repair of AAA would be made when the size approaches 5.0 cm, growth > 1 cm/yr, and symptomatic status.  I emphasized the importance of maximal medical management including strict control of blood pressure, blood glucose, and lipid levels, antiplatelet agents, obtaining regular exercise, and continued cessation of smoking.   The patient was given information about AAA including signs, symptoms, treatment, and how to minimize the risk of enlargement and rupture of aneurysms.    The patient was advised to call 911 should the patient experience sudden onset abdominal or back pain.   Thank you for allowing [place] to participate in this patient's care.  Clemon Chambers, RN, MSN, FNP-C [facility] [place] [facility] Office: [phone]  Clinic Physician: Kellie Simmering  07/13/2014, 9:13 AM

## 2014-07-13 NOTE — Addendum Note (Signed)
Addended by: Mena Goes on: 07/13/2014 03:54 PM   Modules accepted: Orders

## 2014-07-14 ENCOUNTER — Other Ambulatory Visit: Payer: Self-pay | Admitting: Internal Medicine

## 2014-07-20 ENCOUNTER — Other Ambulatory Visit: Payer: Self-pay | Admitting: Internal Medicine

## 2014-07-20 NOTE — Telephone Encounter (Signed)
vasotec rx sent to pharm

## 2014-08-10 ENCOUNTER — Ambulatory Visit (INDEPENDENT_AMBULATORY_CARE_PROVIDER_SITE_OTHER): Payer: Medicare Other | Admitting: Podiatry

## 2014-08-10 ENCOUNTER — Ambulatory Visit: Payer: TRICARE For Life (TFL) | Admitting: Podiatry

## 2014-08-10 ENCOUNTER — Encounter: Payer: Self-pay | Admitting: Podiatry

## 2014-08-10 ENCOUNTER — Ambulatory Visit: Payer: Medicare Other

## 2014-08-10 ENCOUNTER — Ambulatory Visit (INDEPENDENT_AMBULATORY_CARE_PROVIDER_SITE_OTHER): Payer: Medicare Other

## 2014-08-10 VITALS — BP 144/63 | HR 71 | Resp 18

## 2014-08-10 DIAGNOSIS — M76821 Posterior tibial tendinitis, right leg: Secondary | ICD-10-CM

## 2014-08-10 DIAGNOSIS — R52 Pain, unspecified: Secondary | ICD-10-CM | POA: Diagnosis not present

## 2014-08-10 DIAGNOSIS — M2040 Other hammer toe(s) (acquired), unspecified foot: Secondary | ICD-10-CM | POA: Diagnosis not present

## 2014-08-10 DIAGNOSIS — E1140 Type 2 diabetes mellitus with diabetic neuropathy, unspecified: Secondary | ICD-10-CM | POA: Diagnosis not present

## 2014-08-10 LAB — HM DIABETES FOOT EXAM

## 2014-08-11 NOTE — Progress Notes (Signed)
He presents today for one-year follow-up on his diabetes and diabetic peripheral neuropathy. He states that everything seems to be stable however he had developed some pain to the forefoot which lasted about 25 minutes in the right foot just recently. He states that happened one time and was fleeting area  Objective: Vital signs are stable he is alert and oriented 3. Pulses are strongly palpable bilateral. No changes in his neurologic finding. Deep tendon reflexes are intact bilateral and muscle strength is normal bilateral. Orthopedic evaluation of his drains all joints distal to the ankle have full range of motion without crepitation. An cutaneous evaluation demonstrates supple well-hydrated cutis without open lesions or wounds.  Assessment: Diabetic peripheral neuropathy well maintained with diabetic and glucose control.  Plan: In general conservative therapy should that his pain redevelop and become more persistent he is to notify us immediately.

## 2014-08-26 ENCOUNTER — Encounter: Payer: Self-pay | Admitting: Internal Medicine

## 2014-08-26 ENCOUNTER — Ambulatory Visit (INDEPENDENT_AMBULATORY_CARE_PROVIDER_SITE_OTHER): Payer: Medicare Other | Admitting: Internal Medicine

## 2014-08-26 ENCOUNTER — Other Ambulatory Visit (INDEPENDENT_AMBULATORY_CARE_PROVIDER_SITE_OTHER): Payer: Medicare Other

## 2014-08-26 VITALS — BP 142/70 | HR 60 | Temp 97.7°F | Resp 18 | Wt 171.0 lb

## 2014-08-26 DIAGNOSIS — E119 Type 2 diabetes mellitus without complications: Secondary | ICD-10-CM | POA: Diagnosis not present

## 2014-08-26 DIAGNOSIS — I714 Abdominal aortic aneurysm, without rupture, unspecified: Secondary | ICD-10-CM

## 2014-08-26 DIAGNOSIS — I10 Essential (primary) hypertension: Secondary | ICD-10-CM | POA: Diagnosis not present

## 2014-08-26 LAB — HEMOGLOBIN A1C: HEMOGLOBIN A1C: 6.2 % (ref 4.6–6.5)

## 2014-08-26 LAB — BASIC METABOLIC PANEL
BUN: 28 mg/dL — AB (ref 6–23)
CALCIUM: 9.9 mg/dL (ref 8.4–10.5)
CO2: 27 meq/L (ref 19–32)
Chloride: 103 mEq/L (ref 96–112)
Creatinine, Ser: 1.52 mg/dL — ABNORMAL HIGH (ref 0.40–1.50)
GFR: 56.95 mL/min — AB (ref >60.00)
Glucose, Bld: 113 mg/dL — ABNORMAL HIGH (ref 70–99)
Potassium: 5.1 mEq/L (ref 3.5–5.1)
Sodium: 137 mEq/L (ref 135–145)

## 2014-08-26 LAB — MICROALBUMIN / CREATININE URINE RATIO
Creatinine,U: 133.4 mg/dL
Microalb Creat Ratio: 1.3 mg/g (ref 0.0–30.0)
Microalb, Ur: 1.7 mg/dL (ref 0.0–1.9)

## 2014-08-26 NOTE — Progress Notes (Signed)
Pre visit review using our clinic review tool, if applicable. No additional management support is needed unless otherwise documented below in the visit note. 

## 2014-08-26 NOTE — Progress Notes (Signed)
   Subjective:    Patient ID: Brian [name], male    DOB: 14-Mar-1931, 81 y.o.   MRN: [id number]  HPI He is here in follow up of his pre diabetes and HTN.  FBS range/ average: 128 2 hour post meal glucose :150  Last A1c was performed at the [place] in January and was 6.5% by history. Hypoglycemia not reported Ophthalmologic exam is current ;no retinopathy present. Foot care current (in EMR) Diet is heart healthy , low carb &  Low salt Exercise 5-6  times per week for 30-45 minutes as walking w/o cardiopulmonary symptoms.  Dr. Kellie Simmering is monitoring his aneurysm. He states that the diameter is 4.75 cm. Blood pressure at home averages 125/69.  He is off his allopurinol as his last uric acid was 5.4 on 04/20/14. He's had no gout flares off medication.  Review of Systems  Polyuria, polyphagia, polydipsia absent.  There is no blurred vision, double vision, or loss of vision.   No postural dizziness noted. Denied are numbness, tingling, or burning of the extremities.  No nonhealing skin lesions present.  Weight is stable.   Chest pain, palpitations, tachycardia,  paroxysmal nocturnal dyspnea, claudication or edema are absent. Exertional dyspnea only with high level of exercise. Occasional RLE cramping @ vein harvest site. This is treated with ingestion of yellow mustard.         Objective:   Physical Exam Pertinent or positive findings include: Very thin. Pattern alopecia is present. Complete dentures are present. He has bilateral pterygia of the eyes. Second heart sound is accentuated. Aortic aneurysm is palpable.  General appearance :Thin but adequately nourished; in no distress.  Eyes: No conjunctival inflammation or scleral icterus is present.  Oral exam:  Lips and gums are healthy appearing.There is no oropharyngeal erythema or exudate noted.   Heart:  Normal rate and regular rhythm. S1  normal without gallop, murmur, click, rub or other extra sounds    Lungs:Chest clear to  auscultation; no wheezes, rhonchi,rales ,or rubs present.No increased work of breathing.   Abdomen: bowel sounds normal, soft and non-tender without masses, organomegaly or hernias noted.  No guarding or rebound.  Vascular : all pulses equal ; no bruits present.  Skin:Warm & dry.  Intact without suspicious lesions or rashes ; no tenting or jaundice   Lymphatic: No lymphadenopathy is noted about the head, neck, axilla  Neuro: Strength, tone & DTRs normal.        Assessment & Plan:  #1 DM, diet controlled #2 AAA. BP goals discussed See orders

## 2014-08-26 NOTE — Patient Instructions (Signed)
Minimal Blood Pressure Goal= AVERAGE < 135/85. This AVERAGE should be calculated from @ least 5-7 BP readings taken @ different times of day on different days of week. You should not respond to isolated BP readings , but rather the AVERAGE for that week .Please bring your  blood pressure cuff to office visits to verify that it is reliable.It  can also be checked against the blood pressure device at the pharmacy. Finger or wrist cuffs are not dependable; an arm cuff is.   Your next office appointment will be determined based upon review of your pending labs  and  xrays  Those written interpretation of the lab results and instructions will be transmitted to you by My Chart   Critical results will be called.   Followup as needed for any active or acute issue. Please report any significant change in your symptoms.  Share results with [facility] medical staff seen

## 2014-09-03 ENCOUNTER — Other Ambulatory Visit: Payer: Self-pay | Admitting: Internal Medicine

## 2014-09-13 ENCOUNTER — Encounter: Payer: Self-pay | Admitting: Internal Medicine

## 2014-09-13 ENCOUNTER — Ambulatory Visit (INDEPENDENT_AMBULATORY_CARE_PROVIDER_SITE_OTHER): Payer: Medicare Other | Admitting: Internal Medicine

## 2014-09-13 VITALS — BP 132/68 | HR 64 | Temp 97.9°F | Resp 18 | Wt 172.0 lb

## 2014-09-13 DIAGNOSIS — J209 Acute bronchitis, unspecified: Secondary | ICD-10-CM

## 2014-09-13 DIAGNOSIS — K219 Gastro-esophageal reflux disease without esophagitis: Secondary | ICD-10-CM

## 2014-09-13 MED ORDER — OMEPRAZOLE 20 MG PO CPDR: 20.0000 mg | DELAYED_RELEASE_CAPSULE | Freq: Every day | ORAL | Status: DC

## 2014-09-13 MED ORDER — AZITHROMYCIN 250 MG PO TABS: ORAL_TABLET | ORAL | Status: DC

## 2014-09-13 NOTE — Progress Notes (Signed)
Pre visit review using our clinic review tool, if applicable. No additional management support is needed unless otherwise documented below in the visit note. 

## 2014-09-13 NOTE — Patient Instructions (Signed)
Plain Mucinex (NOT D) for thick secretions ;force NON dairy fluids .   Nasal cleansing in the shower as discussed with lather of mild shampoo.After 10 seconds wash off lather while  exhaling through nostrils. Make sure that all residual soap is removed to prevent irritation.  Flonase OR Nasacort AQ 1 spray in each nostril twice a day as needed. Use the "crossover" technique into opposite nostril spraying toward opposite ear @ 45 degree angle, not straight up into nostril.  Plain Allegra (NOT D )  160 daily , Loratidine 10 mg , OR Zyrtec 10 mg @ bedtime  as needed for itchy eyes & sneezing.  Reflux of gastric acid may be asymptomatic as this may occur mainly during sleep.The triggers for reflux  include stress; the "aspirin family" ; alcohol; peppermint; and caffeine (coffee, tea, cola, and chocolate). The aspirin family would include aspirin and the nonsteroidal agents such as ibuprofen &  Naproxen. Tylenol would not cause reflux. If having symptoms ; food & drink should be avoided for @ least 2 hours before going to bed. Take the protein pump inhibitor  Omeprazole 30 minutes before breakfast .  Anoro  one inhalations daily; gargle and spit after use. Lot #: [id number] Exp :2/18

## 2014-09-13 NOTE — Progress Notes (Signed)
   Subjective:    Patient ID: Brian [name], male    DOB: 1931-12-28, 81 y.o.   MRN: [id number]  HPI   He was treated for bronchitis in May with Augmentin and a narcotic cough syrup with complete resolution of symptoms. Chest x-ray at that time revealed no active process.   12-14 days ago he developed a cough and chest congestion with the production of thick, white/yellow sputum. He treated this with Mucinex and expectorant cough syrup. The symptoms are worse during the day but occasionally wake him up due to the cough. He has no associated fever, chills or sweats. There is no associated chest pain, shortness of breath, or wheezing.  Review of Systems Frontal headache, facial pain , nasal purulence, dental pain, sore throat , otic pain or otic discharge denied. No fever , chills or sweats.  He is on ranitidine twice a day and denies significant reflux symptoms.    Objective:   Physical Exam  General appearance:Adequately nourished; no acute distress or increased work of breathing is present.  Pattern alopecia and goatee present.  Lymphatic: No  lymphadenopathy about the head, neck, or axilla .  Eyes: No conjunctival inflammation or lid edema is present. There is no scleral icterus.  Ears:  External ear exam shows no significant lesions or deformities.  Otoscopic examination reveals clear canals, tympanic membranes are intact bilaterally without bulging, retraction, inflammation or discharge.  Nose:  External nasal examination shows no deformity or inflammation. Nasal mucosa erythematous without lesions or exudates No septal dislocation or deviation.No obstruction to airflow.   Oral exam: Complete dentures; lips and gums are healthy appearing.There is mild oropharyngeal erythema without exudate .  Neck:  No deformities, thyromegaly, masses, or tenderness noted.   Supple with full range of motion without pain.   Heart:  Normal rate and regular rhythm. S1 normal. S2 accentuated. No  associated gallop, murmur, click, rub or other extra sounds.   Lungs:Chest clear to auscultation; no wheezes, rhonchi,rales ,or rubs present. Breath sounds slightly decreased.  Extremities:  No cyanosis or edema. Suggestion of clubbing  noted    Skin: Warm & dry w/o tenting  No significant lesions or rash.        Assessment & Plan:  #1 acute bronchitis w/o bronchospasm #2 GERD Plan: See orders and recommendations

## 2014-10-08 ENCOUNTER — Other Ambulatory Visit: Payer: Self-pay | Admitting: Emergency Medicine

## 2014-10-08 DIAGNOSIS — K219 Gastro-esophageal reflux disease without esophagitis: Secondary | ICD-10-CM

## 2014-10-08 MED ORDER — OMEPRAZOLE 20 MG PO CPDR: 20.0000 mg | DELAYED_RELEASE_CAPSULE | Freq: Every day | ORAL | Status: DC

## 2014-10-13 ENCOUNTER — Other Ambulatory Visit: Payer: Self-pay | Admitting: Internal Medicine

## 2014-10-14 ENCOUNTER — Other Ambulatory Visit: Payer: Self-pay

## 2014-10-14 MED ORDER — SIMVASTATIN 40 MG PO TABS: 40.0000 mg | ORAL_TABLET | Freq: Every day | ORAL | Status: DC

## 2014-11-08 DIAGNOSIS — H26491 Other secondary cataract, right eye: Secondary | ICD-10-CM | POA: Diagnosis not present

## 2014-11-08 DIAGNOSIS — Z961 Presence of intraocular lens: Secondary | ICD-10-CM | POA: Diagnosis not present

## 2014-11-08 DIAGNOSIS — H11003 Unspecified pterygium of eye, bilateral: Secondary | ICD-10-CM | POA: Diagnosis not present

## 2014-11-08 DIAGNOSIS — E119 Type 2 diabetes mellitus without complications: Secondary | ICD-10-CM | POA: Diagnosis not present

## 2014-11-08 LAB — HM DIABETES EYE EXAM

## 2014-11-23 ENCOUNTER — Other Ambulatory Visit: Payer: Self-pay

## 2014-11-23 ENCOUNTER — Ambulatory Visit (INDEPENDENT_AMBULATORY_CARE_PROVIDER_SITE_OTHER): Payer: Medicare Other

## 2014-11-23 DIAGNOSIS — K219 Gastro-esophageal reflux disease without esophagitis: Secondary | ICD-10-CM

## 2014-11-23 DIAGNOSIS — Z23 Encounter for immunization: Secondary | ICD-10-CM | POA: Diagnosis not present

## 2014-11-23 MED ORDER — OMEPRAZOLE 20 MG PO CPDR: 20.0000 mg | DELAYED_RELEASE_CAPSULE | Freq: Every day | ORAL | Status: DC

## 2014-11-24 ENCOUNTER — Encounter: Payer: Self-pay | Admitting: Internal Medicine

## 2014-12-29 ENCOUNTER — Ambulatory Visit (INDEPENDENT_AMBULATORY_CARE_PROVIDER_SITE_OTHER): Payer: Medicare Other | Admitting: Internal Medicine

## 2014-12-29 ENCOUNTER — Encounter: Payer: Self-pay | Admitting: Internal Medicine

## 2014-12-29 VITALS — BP 150/62 | HR 57 | Temp 98.1°F | Resp 12 | Ht 71.0 in | Wt 181.0 lb

## 2014-12-29 DIAGNOSIS — L739 Follicular disorder, unspecified: Secondary | ICD-10-CM | POA: Diagnosis not present

## 2014-12-29 MED ORDER — CEPHALEXIN 500 MG PO CAPS: 500.0000 mg | ORAL_CAPSULE | Freq: Two times a day (BID) | ORAL | Status: DC

## 2014-12-29 NOTE — Patient Instructions (Signed)
Use  Aveeno Daily  Moisturizing Lotion  twice a day  for the skin lesions. Bathe with moisturizing liquid soap , not bar soap.  Please take Zyrtec 10 mg or generic equivalent at bedtime

## 2014-12-29 NOTE — Progress Notes (Signed)
Pre visit review using our clinic review tool, if applicable. No additional management support is needed unless otherwise documented below in the visit note. 

## 2014-12-29 NOTE — Progress Notes (Signed)
   Subjective:    Patient ID: Brian [name], male    DOB: [birth year], 81 y.o.   MRN: [id number]  HPI He has had itching over the left chest for 6-7 months; one month ago this began on the right chest area also. As of 11/4 he's noted itching in the mid chest. After he scratched repeatedly he has noted a diffuse rash over the chest. He's been using Cetaphil and Kenalog with partial response.  Review of Systems  No associated itchy, watery eyes.  Swelling of the lips or tongue denied.  Shortness of breath, wheezing, or cough absent.  No rash or urticaria noted.  Fever ,chills , or sweats denied. Purulence absent.  Diarrhea not present.      Objective:   Physical Exam Pertinent or positive findings include: pattern alopecia is present. He has a Engineering geologist. He has complete dentures. There are micropapular lesions over the anterior chest which suggest folliculitis. There is a very large leaf nevus of the left lateral chest. Dorsalis pedis pulses are decreased.  General appearance :adequately nourished; in no distress.  Eyes: No conjunctival inflammation or scleral icterus is present.  Oral exam:  Lips and gums are healthy appearing.There is no oropharyngeal erythema or exudate noted.   Heart:  Normal rate and regular rhythm. S1 and S2 normal without gallop, murmur, click, rub or other extra sounds    Lungs:Chest clear to auscultation; no wheezes, rhonchi,rales ,or rubs present.No increased work of breathing.   Abdomen: bowel sounds normal, soft and non-tender without masses, organomegaly or hernias noted.  No guarding or rebound.  Vascular : all pulses equal ; no bruits present.  Skin:Warm & dry; no tenting    Lymphatic: No lymphadenopathy is noted about the head, neck, axilla  Neuro: Strength, tone normal.      Assessment & Plan:  #1 Folliculitis  See orders and after visit summary

## 2015-01-10 ENCOUNTER — Other Ambulatory Visit: Payer: Self-pay | Admitting: Internal Medicine

## 2015-01-10 ENCOUNTER — Other Ambulatory Visit: Payer: Self-pay | Admitting: Emergency Medicine

## 2015-01-10 MED ORDER — FLUTICASONE PROPIONATE 50 MCG/ACT NA SUSP: NASAL | Status: DC

## 2015-01-11 ENCOUNTER — Encounter: Payer: Self-pay | Admitting: Vascular Surgery

## 2015-01-18 ENCOUNTER — Encounter: Payer: Self-pay | Admitting: Vascular Surgery

## 2015-01-18 ENCOUNTER — Other Ambulatory Visit: Payer: Self-pay | Admitting: Family

## 2015-01-18 ENCOUNTER — Ambulatory Visit (INDEPENDENT_AMBULATORY_CARE_PROVIDER_SITE_OTHER): Payer: Medicare Other | Admitting: Vascular Surgery

## 2015-01-18 ENCOUNTER — Ambulatory Visit (HOSPITAL_COMMUNITY)
Admission: RE | Admit: 2015-01-18 | Discharge: 2015-01-18 | Disposition: A | Discharge status: Home / Self Care | Payer: Medicare Other | Source: Ambulatory Visit | Attending: Vascular Surgery | Admitting: Vascular Surgery

## 2015-01-18 VITALS — BP 166/74 | HR 55 | Temp 97.6°F | Resp 18 | Ht 71.0 in | Wt 180.3 lb

## 2015-01-18 DIAGNOSIS — I714 Abdominal aortic aneurysm, without rupture, unspecified: Secondary | ICD-10-CM

## 2015-01-18 DIAGNOSIS — Z87891 Personal history of nicotine dependence: Secondary | ICD-10-CM | POA: Diagnosis not present

## 2015-01-18 DIAGNOSIS — I739 Peripheral vascular disease, unspecified: Secondary | ICD-10-CM | POA: Insufficient documentation

## 2015-01-18 NOTE — Progress Notes (Signed)
Subjective:     Patient ID: Brian [name], male   DOB: [birth year], 81 y.o.   MRN: [id number]  HPI this 81 year old male returns for continued follow-up regarding his abdominal aortic aneurysm. We have been following this for the past few years and it is gradually increased in size. Most recently it was 4.76 cm 6 months ago and today by ultrasound it measures 4.80 cm. He has had no abdominal or back pain. Have right calf claudication symptoms but is able to ambulate up to 1 mile. He said no history of rest pain and nonhealing ulcers infection or gangrene.  Past Medical History  Diagnosis Date  . CAD (coronary artery disease)     NSTEMI 02/2010;  s/p CABG 1/12 (L-LAD, SOM1, S-PDA); echo in 08/2010: Mild LVH, EF 06%, grade 2 diastolic dysfunction, mild MR, mild LAE, mildly reduced RV function, mild RAE, PASP 42  . Diabetes mellitus 2000    adult onset  . Gout   . ED (erectile dysfunction)   . Hyperlipidemia   . Colon polyps   . Macular degeneration   . AAA (abdominal aortic aneurysm) ([place])   . Asthma 1976  . HTN (hypertension)   . COPD (chronic obstructive pulmonary disease) ([place])   . Myocardial infarction ([place]) 1 -13-2012  . Neuropathy ([place])   . Bronchiolitis May 2016    Social History  Substance Use Topics  . Smoking status: Former Smoker    Types: Cigarettes    Quit date: 02/20/2003  . Smokeless tobacco: Never Used  . Alcohol Use: 3.6 oz/week    3 Glasses of wine, 3 Shots of liquor per week     Comment:  1-3 drinks a week    Family History  Problem Relation Age of Onset  . Heart failure Mother   . Heart disease Mother     CHF  . Heart attack Mother   . Hypertension Mother   . Heart attack Father   . Heart disease Father 8    MI  . Hypertension Father   . Heart attack Sister 71  . Heart disease Sister 4    MI  . Diabetes Sister   . Hyperlipidemia Sister   . Hypertension Sister   . Varicose Veins Sister   . Heart attack Brother 41  . Diabetes Brother   . Heart  disease Brother 61    MI  . Cancer Brother   . Hypertension Brother   . Deep vein thrombosis Daughter   . Other Son     varicose veins  . Colon cancer Neg Hx     Allergies  Allergen Reactions  . Meperidine Hcl     Tongue swelling Because of a history of documented adverse serious drug reaction;Medi Alert bracelet  is recommended  . Zolpidem Tartrate     REACTION: Crazy dreams     Current outpatient prescriptions:  .  ACCU-CHEK COMPACT STRIPS test strip, TEST BLOOD SUGAR ONCE DAILY, Disp: 100 each, Rfl: 1 .  Aspirin (ADULT ASPIRIN LOW STRENGTH) 81 MG EC tablet, Take 81 mg by mouth daily.  , Disp: , Rfl:  .  capsaicin (ZOSTRIX) 0.025 % cream, Apply 1 application topically as needed., Disp: , Rfl:  .  capsicum oleoresin (TRIXAICIN) 0.025 % cream, Apply 1 application topically as needed., Disp: , Rfl:  .  cephALEXin (KEFLEX) 500 MG capsule, Take 1 capsule (500 mg total) by mouth 2 (two) times daily., Disp: 14 capsule, Rfl: 0 .  cetirizine (ZYRTEC) 10 MG tablet,  Take 10 mg by mouth daily., Disp: , Rfl:  .  cholecalciferol (VITAMIN D) 1000 UNITS tablet, 2 ;000 units daily, Disp: , Rfl:  .  enalapril (VASOTEC) 10 MG tablet, TAKE ONE AND ONE-HALF TABLETS DAILY, Disp: 135 tablet, Rfl: 2 .  fluticasone (FLONASE) 50 MCG/ACT nasal spray, USE 1 SPRAY IN EACH NOSTRIL TWICE A DAY AS NEEDED (USE THE CROSSOVER TECHNIQUE AS DISCUSSED), Disp: 48 g, Rfl: 3 .  Magnesium Oxide 420 MG TABS, Take 420 mg by mouth daily. 420 mg by mouth daily, Disp: , Rfl:  .  metoprolol succinate (TOPROL-XL) 25 MG 24 hr tablet, TAKE ONE-HALF (1/2) TABLET DAILY, Disp: 45 tablet, Rfl: 2 .  Multiple Vitamins-Minerals (MULTIVITAL PO), Take by mouth daily., Disp: , Rfl:  .  simvastatin (ZOCOR) 40 MG tablet, Take 1 tablet (40 mg total) by mouth at bedtime., Disp: 90 tablet, Rfl: 1 .  temazepam (RESTORIL) 7.5 MG capsule, Take 7.5 mg by mouth at bedtime as needed., Disp: , Rfl:  .  triamcinolone cream (KENALOG) 0.1 %, Bid prn  (Patient taking differently: Apply 1 application topically 2 (two) times daily. Bid prn apply to skin as needed for inflammation), Disp: 240 g, Rfl: 2 .  azithromycin (ZITHROMAX Z-PAK) 250 MG tablet, 2 day 1, then 1 qd (Patient not taking: Reported on 12/29/2014), Disp: 6 tablet, Rfl: 0 .  chlorpheniramine (CHLOR-TRIMETON) 4 MG tablet, Take 4 mg by mouth every 4 (four) hours as needed for allergies., Disp: , Rfl:  .  omeprazole (PRILOSEC) 20 MG capsule, Take 1 capsule (20 mg total) by mouth daily. (Patient not taking: Reported on 01/18/2015), Disp: 90 capsule, Rfl: 3  Filed Vitals:   01/18/15 0855 01/18/15 0859  BP: 148/65 166/74  Pulse: 55   Temp: 97.6 F (36.4 C)   TempSrc: Oral   Resp: 18   Height: '5\' 11"'$  (1.803 m)   Weight: 180 lb 4.8 oz (81.784 kg)   SpO2: 96%     Body mass index is 25.16 kg/(m^2).           Review of Systems patient has remote history of coronary artery disease with coronary artery bypass grafting but currently asymptomatic. Also has COPD. Right leg, location as described above. Other systems negative and complete review of systems    Objective:   Physical Exam BP 166/74 mmHg  Pulse 55  Temp(Src) 97.6 F (36.4 C) (Oral)  Resp 18  Ht '5\' 11"'$  (1.803 m)  Wt 180 lb 4.8 oz (81.784 kg)  BMI 25.16 kg/m2  SpO2 96%  Gen.-alert and oriented x3 in no apparent distress HEENT normal for age Lungs no rhonchi or wheezing Cardiovascular regular rhythm no murmurs carotid pulses 3+ palpable no bruits audible Abdomen soft nontender -5 cm pulsatile mass Musculoskeletal free of  major deformities Skin clear -no rashes Neurologic normal Lower extremities 3+ femoral and 2+ popliteal and 2+ dorsalis pedis pulse on the left 3+ femoral and absent popliteal and distal pulses on the right. Some pretibial varicosities left leg with no ulceration.  Today I ordered a duplex scan of his abdominal aortic aneurysm which I reviewed and interpreted. Maximum diameter today is  4.80 cm. Also reviewed CT angiogram performed in 2014 which reveals the patient is a satisfactory candidate for aortic stent grafting if aneurysm enlarges any further       Assessment:     Abdominal aortic aneurysm-4.80 cm in maximum diameter PAD with right superficial femoral artery occlusion and right calf claudication-stable Coronary artery disease status post  remote history coronary artery bypass grafting-asymptomatic COPD    Plan:     Return in 6 months with duplex scan of abdominal aortic aneurysm to watch size closely If patient develops any symptoms of abdominal or flank pain he will report immediately to the emergency department for CT scan.

## 2015-01-18 NOTE — Progress Notes (Signed)
Filed Vitals:   01/18/15 0855 01/18/15 0859  BP: 148/65 166/74  Pulse: 55   Temp: 97.6 F (36.4 C)   TempSrc: Oral   Resp: 18   Height: '5\' 11"'$  (1.803 m)   Weight: 180 lb 4.8 oz (81.784 kg)   SpO2: 96%

## 2015-01-18 NOTE — Addendum Note (Signed)
Addended by: Dorthula Rue L on: 01/18/2015 11:05 AM   Modules accepted: Orders

## 2015-01-30 ENCOUNTER — Other Ambulatory Visit: Payer: Self-pay | Admitting: Cardiovascular Disease

## 2015-03-09 ENCOUNTER — Ambulatory Visit (INDEPENDENT_AMBULATORY_CARE_PROVIDER_SITE_OTHER): Payer: Medicare Other | Admitting: Nurse Practitioner

## 2015-03-09 ENCOUNTER — Encounter: Payer: Self-pay | Admitting: Nurse Practitioner

## 2015-03-09 VITALS — BP 138/68 | HR 61 | Temp 97.6°F | Ht 71.0 in | Wt 180.0 lb

## 2015-03-09 DIAGNOSIS — Z23 Encounter for immunization: Secondary | ICD-10-CM | POA: Diagnosis not present

## 2015-03-09 DIAGNOSIS — M542 Cervicalgia: Secondary | ICD-10-CM | POA: Diagnosis not present

## 2015-03-09 MED ORDER — METHOCARBAMOL 500 MG PO TABS: ORAL_TABLET | ORAL | Status: DC

## 2015-03-09 NOTE — Patient Instructions (Signed)
Take 1/2 to 1 tablet right before bed, don't get up with this medication or drive until you feel like it has worn off. Only take it if you feel you need it for the pain.   Aleve once in the morning, heat, and stretching.   Follow up if anything changes  Thank you for getting your Pneumonia vaccine today!

## 2015-03-09 NOTE — Progress Notes (Signed)
Patient ID: Brian [name], male    DOB: 1931/04/03  Age: 82 y.o. MRN: [id number]  CC: Neck Pain and Immunizations   HPI VICENT FEBLES presents for CC of neck generalized x 7 days.   1) Woke up with it on Thursday after sleeping on it wrong he reports  Aching pain has improved today Denies shooting pains or worse on left vs. Right side  History of cervicalgia  Denies fever, chills, sweats  Treatment to date:  Heat- helpful  Ibuprofen- helpful   History Rishikesh has a past medical history of CAD (coronary artery disease); Diabetes mellitus (2000); Gout; ED (erectile dysfunction); Hyperlipidemia; Colon polyps; Macular degeneration; AAA (abdominal aortic aneurysm) ([place]); Asthma (1976); HTN (hypertension); COPD (chronic obstructive pulmonary disease) ([place]); Myocardial infarction ([place]) (1 -13-2012); Neuropathy ([place]); and Bronchiolitis (May 2016).   He has past surgical history that includes Incision and drainage perirectal abscess; Colonoscopy; Inguinal hernia repair; cataract left eye; 3 vessel CABG (03/03/2010); Coronary artery bypass graft (2012); Colonoscopy (2014); and Eye surgery.   His family history includes Cancer in his brother; Deep vein thrombosis in his daughter; Diabetes in his brother and sister; Heart attack in his father and mother; Heart attack (age of onset: 42) in his sister; Heart attack (age of onset: 55) in his brother; Heart disease in his mother; Heart disease (age of onset: 52) in his sister; Heart disease (age of onset: 51) in his brother; Heart disease (age of onset: 77) in his father; Heart failure in his mother; Hyperlipidemia in his sister; Hypertension in his brother, father, mother, and sister; Other in his son; Varicose Veins in his sister. There is no history of Colon cancer.He reports that he quit smoking about 12 years ago. His smoking use included Cigarettes. He has never used smokeless tobacco. He reports that he drinks about 3.6 oz of alcohol per week. He  reports that he does not use illicit drugs.  Outpatient Prescriptions Prior to Visit  Medication Sig Dispense Refill  . ACCU-CHEK COMPACT STRIPS test strip TEST BLOOD SUGAR ONCE DAILY 100 each 1  . Aspirin (ADULT ASPIRIN LOW STRENGTH) 81 MG EC tablet Take 81 mg by mouth daily.      . capsaicin (ZOSTRIX) 0.025 % cream Apply 1 application topically as needed.    . capsicum oleoresin (TRIXAICIN) 0.025 % cream Apply 1 application topically as needed.    . cetirizine (ZYRTEC) 10 MG tablet Take 10 mg by mouth daily.    . cholecalciferol (VITAMIN D) 1000 UNITS tablet 2 ;000 units daily    . enalapril (VASOTEC) 10 MG tablet TAKE ONE AND ONE-HALF TABLETS DAILY 135 tablet 2  . fluticasone (FLONASE) 50 MCG/ACT nasal spray USE 1 SPRAY IN EACH NOSTRIL TWICE A DAY AS NEEDED (USE THE CROSSOVER TECHNIQUE AS DISCUSSED) 48 g 3  . Magnesium Oxide 420 MG TABS Take 420 mg by mouth daily. 420 mg by mouth daily    . Multiple Vitamins-Minerals (MULTIVITAL PO) Take by mouth daily.    . simvastatin (ZOCOR) 40 MG tablet Take 1 tablet (40 mg total) by mouth at bedtime. 90 tablet 1  . temazepam (RESTORIL) 7.5 MG capsule Take 7.5 mg by mouth at bedtime as needed.    . TOPROL XL 25 MG 24 hr tablet TAKE ONE-HALF (1/2) TABLET DAILY 45 tablet 0  . triamcinolone cream (KENALOG) 0.1 % Bid prn (Patient taking differently: Apply 1 application topically 2 (two) times daily. Bid prn apply to skin as needed for inflammation) 240 g 2  .  azithromycin (ZITHROMAX Z-PAK) 250 MG tablet 2 day 1, then 1 qd (Patient not taking: Reported on 12/29/2014) 6 tablet 0  . cephALEXin (KEFLEX) 500 MG capsule Take 1 capsule (500 mg total) by mouth 2 (two) times daily. 14 capsule 0  . chlorpheniramine (CHLOR-TRIMETON) 4 MG tablet Take 4 mg by mouth every 4 (four) hours as needed for allergies.    Marland Kitchen omeprazole (PRILOSEC) 20 MG capsule Take 1 capsule (20 mg total) by mouth daily. (Patient not taking: Reported on 01/18/2015) 90 capsule 3   No  facility-administered medications prior to visit.    ROS Review of Systems  Constitutional: Negative for fever, chills, diaphoresis and fatigue.  Eyes: Negative for visual disturbance.  Musculoskeletal: Positive for myalgias, neck pain and neck stiffness.    Objective:  BP 138/68 mmHg  Pulse 61  Temp(Src) 97.6 F (36.4 C) (Oral)  Ht '5\' 11"'$  (1.803 m)  Wt 180 lb (81.647 kg)  BMI 25.12 kg/m2  SpO2 93%  Physical Exam  Constitutional: He is oriented to person, place, and time. He appears well-developed and well-nourished. No distress.  HENT:  Head: Normocephalic and atraumatic.  Right Ear: External ear normal.  Left Ear: External ear normal.  Eyes: EOM are normal. Pupils are equal, round, and reactive to light. Right eye exhibits no discharge. Left eye exhibits no discharge. No scleral icterus.  Neck: Normal range of motion. Neck supple. No tracheal deviation present.  Musculoskeletal: Normal range of motion. He exhibits tenderness. He exhibits no edema.  Tight trapezius muscles bilaterally  Normal ROM with pain when going extreme left and extreme right, able to touch chin to chest  Lymphadenopathy:    He has no cervical adenopathy.  Neurological: He is alert and oriented to person, place, and time. He exhibits normal muscle tone. Coordination normal.  Skin: Skin is warm and dry. No rash noted. He is not diaphoretic.  Psychiatric: He has a normal mood and affect. His behavior is normal. Judgment and thought content normal.   Assessment & Plan:   Jeptha was seen today for neck pain and immunizations.  Diagnoses and all orders for this visit:  Need for prophylactic vaccination against Streptococcus pneumoniae (pneumococcus) -     Pneumococcal conjugate vaccine 13-valent  Cervicalgia  Other orders -     methocarbamol (ROBAXIN) 500 MG tablet; Take 1/2 tablet to 1 tablet by mouth at bedtime   I have discontinued Mr. Mittman chlorpheniramine, azithromycin, omeprazole, and  cephALEXin. I am also having him start on methocarbamol. Additionally, I am having him maintain his Aspirin, cholecalciferol, temazepam, ACCU-CHEK COMPACT STRIPS, capsicum oleoresin, triamcinolone cream, capsaicin, Multiple Vitamins-Minerals (MULTIVITAL PO), Magnesium Oxide, enalapril, simvastatin, fluticasone, cetirizine, and TOPROL XL.  Meds ordered this encounter  Medications  . methocarbamol (ROBAXIN) 500 MG tablet    Sig: Take 1/2 tablet to 1 tablet by mouth at bedtime    Dispense:  15 tablet    Refill:  0    Order Specific Question:  Supervising Provider    Answer:  Crecencio Mc [[id number]]     Follow-up: Return if symptoms worsen or fail to improve.

## 2015-03-09 NOTE — Progress Notes (Signed)
Pre visit review using our clinic review tool, if applicable. No additional management support is needed unless otherwise documented below in the visit note. 

## 2015-03-09 NOTE — Assessment & Plan Note (Addendum)
Established problem improving MSK in nature  Robaxin prn at night gave precautions related to this prefer him to take 1/2 tablet right before bed NSAIDs okay and heat to continue FU prn worsening/failure to improve.

## 2015-03-11 LAB — LIPID PANEL
CHOLESTEROL: 146 mg/dL (ref 0–200)
HDL: 54 mg/dL (ref 35–70)
LDL Cholesterol: 72 mg/dL
TRIGLYCERIDES: 99 mg/dL (ref 40–160)

## 2015-03-11 LAB — BASIC METABOLIC PANEL
CREATININE: 1.3 mg/dL (ref 0.6–1.3)
Glucose: 102 mg/dL
POTASSIUM: 4.8 mmol/L (ref 3.4–5.3)
Sodium: 143 mmol/L (ref 137–147)

## 2015-03-11 LAB — CBC AND DIFFERENTIAL
NEUTROS ABS: 48 /uL
Platelets: 204 10*3/uL (ref 150–399)
WBC: 7.1 10^3/mL

## 2015-03-11 LAB — HEMOGLOBIN A1C: Hemoglobin A1C: 6.4

## 2015-04-01 LAB — PULMONARY FUNCTION TEST

## 2015-04-05 ENCOUNTER — Other Ambulatory Visit (INDEPENDENT_AMBULATORY_CARE_PROVIDER_SITE_OTHER): Payer: Medicare Other

## 2015-04-05 ENCOUNTER — Ambulatory Visit (INDEPENDENT_AMBULATORY_CARE_PROVIDER_SITE_OTHER): Payer: Medicare Other | Admitting: Internal Medicine

## 2015-04-05 ENCOUNTER — Encounter: Payer: Self-pay | Admitting: Internal Medicine

## 2015-04-05 VITALS — BP 144/70 | HR 52 | Temp 97.7°F | Ht 71.0 in | Wt 188.6 lb

## 2015-04-05 DIAGNOSIS — R040 Epistaxis: Secondary | ICD-10-CM | POA: Diagnosis not present

## 2015-04-05 DIAGNOSIS — N289 Disorder of kidney and ureter, unspecified: Secondary | ICD-10-CM

## 2015-04-05 DIAGNOSIS — I10 Essential (primary) hypertension: Secondary | ICD-10-CM

## 2015-04-05 LAB — BASIC METABOLIC PANEL
BUN: 24 mg/dL — ABNORMAL HIGH (ref 6–23)
CALCIUM: 9.5 mg/dL (ref 8.4–10.5)
CO2: 29 mEq/L (ref 19–32)
CREATININE: 1.32 mg/dL (ref 0.40–1.50)
Chloride: 104 mEq/L (ref 96–112)
GFR: 66.92 mL/min (ref >60.00)
Glucose, Bld: 100 mg/dL — ABNORMAL HIGH (ref 70–99)
Potassium: 4.6 mEq/L (ref 3.5–5.1)
SODIUM: 139 meq/L (ref 135–145)

## 2015-04-05 LAB — CBC WITH DIFFERENTIAL/PLATELET
BASOS ABS: 0.1 10*3/uL (ref 0.0–0.1)
BASOS PCT: 0.8 % (ref 0.0–3.0)
EOS ABS: 0.5 10*3/uL (ref 0.0–0.7)
Eosinophils Relative: 8.1 % — ABNORMAL HIGH (ref 0.0–5.0)
HCT: 40.7 % (ref 39.0–52.0)
HEMOGLOBIN: 13.6 g/dL (ref 13.0–17.0)
Lymphocytes Relative: 39.3 % (ref 12.0–46.0)
Lymphs Abs: 2.6 10*3/uL (ref 0.7–4.0)
MCHC: 33.3 g/dL (ref 30.0–36.0)
MCV: 91 fl (ref 78.0–100.0)
MONO ABS: 0.7 10*3/uL (ref 0.1–1.0)
Monocytes Relative: 10.5 % (ref 3.0–12.0)
NEUTROS ABS: 2.7 10*3/uL (ref 1.4–7.7)
Neutrophils Relative %: 41.3 % — ABNORMAL LOW (ref 43.0–77.0)
PLATELETS: 211 10*3/uL (ref 150.0–400.0)
RBC: 4.47 Mil/uL (ref 4.22–5.81)
RDW: 13.5 % (ref 11.5–15.5)
WBC: 6.6 10*3/uL (ref 4.0–10.5)

## 2015-04-05 LAB — PROTIME-INR
INR: 1.1 ratio — AB (ref 0.8–1.0)
PROTHROMBIN TIME: 11.6 s (ref 9.6–13.1)

## 2015-04-05 MED ORDER — TRIAMCINOLONE ACETONIDE 55 MCG/ACT NA AERO: 2.0000 | INHALATION_SPRAY | Freq: Every day | NASAL | Status: DC

## 2015-04-05 NOTE — Progress Notes (Signed)
Subjective:    Patient ID: Brian [name], male    DOB: 1931/10/31, 82 y.o.   MRN: [id number]  HPI  New pt of Dr Quay Burow (not yet seen), c/o once weekly x 3 wks nosebleed that seems to only occur at night, wakes him up, seems large volume to him, gets up and uses cold compresses and pressure for about 15 min to stop.  Takes the zyrtec daily,m On flonase for allergies, but has only used once this yr last Thursday, has used fairly freq last fall without nosebleed.  No trauma.  On asa 81 mg, but no anticoagulants.  No hx of other chronic sinusitis, polyps or recurrent infections.  No fever, no sick contacts.  BP has been fairly well controlled, at home usually less than 140's.  No platelet problem or other known coagulopathy, no know liver disease. Has admittedly dry night time environment and not sure having the tea kettle on at night for humidity is working Pt denies chest pain, increased sob or doe, wheezing, orthopnea, PND, increased LE swelling, palpitations, dizziness or syncope. Past Medical History  Diagnosis Date  . CAD (coronary artery disease)     NSTEMI 02/2010;  s/p CABG 1/12 (L-LAD, SOM1, S-PDA); echo in 08/2010: Mild LVH, EF 40%, grade 2 diastolic dysfunction, mild MR, mild LAE, mildly reduced RV function, mild RAE, PASP 42  . Diabetes mellitus 2000    adult onset  . Gout   . ED (erectile dysfunction)   . Hyperlipidemia   . Colon polyps   . Macular degeneration   . AAA (abdominal aortic aneurysm) ([place])   . Asthma 1976  . HTN (hypertension)   . COPD (chronic obstructive pulmonary disease) ([place])   . Myocardial infarction ([place]) 1 -13-2012  . Neuropathy ([place])   . Bronchiolitis May 2016   Past Surgical History  Procedure Laterality Date  . Incision and drainage perirectal abscess    . Colonoscopy      neg 2009  . Inguinal hernia repair    . Cataract left eye    . 3 vessel cabg  03/03/2010  . Coronary artery bypass graft  2012    X3  . Colonoscopy  2014    5 mm sessile polyp;  AVM  . Eye surgery      reports that he quit smoking about 12 years ago. His smoking use included Cigarettes. He has never used smokeless tobacco. He reports that he drinks about 3.6 oz of alcohol per week. He reports that he does not use illicit drugs. family history includes Cancer in his brother; Deep vein thrombosis in his daughter; Diabetes in his brother and sister; Heart attack in his father and mother; Heart attack (age of onset: 46) in his sister; Heart attack (age of onset: 37) in his brother; Heart disease in his mother; Heart disease (age of onset: 63) in his sister; Heart disease (age of onset: 34) in his brother; Heart disease (age of onset: 76) in his father; Heart failure in his mother; Hyperlipidemia in his sister; Hypertension in his brother, father, mother, and sister; Other in his son; Varicose Veins in his sister. There is no history of Colon cancer. Allergies  Allergen Reactions  . Meperidine Hcl     Tongue swelling Because of a history of documented adverse serious drug reaction;Medi Alert bracelet  is recommended  . Zolpidem Tartrate     REACTION: Crazy dreams   Review of Systems  Constitutional: Negative for unusual diaphoresis or night sweats HENT:  Negative for ringing in ear or discharge Eyes: Negative for double vision or worsening visual disturbance.  Respiratory: Negative for choking and stridor.   Gastrointestinal: Negative for vomiting or other signifcant bowel change Genitourinary: Negative for hematuria or change in urine volume.  Musculoskeletal: Negative for other MSK pain or swelling Skin: Negative for color change and worsening wound.  Neurological: Negative for tremors and numbness other than noted  Psychiatric/Behavioral: Negative for decreased concentration or agitation other than above       Objective:   Physical Exam BP 144/70 mmHg  Pulse 52  Temp(Src) 97.7 F (36.5 C) (Oral)  Ht '5\' 11"'$  (1.803 m)  Wt 188 lb 9 oz (85.531 kg)  BMI 26.31 kg/m2   SpO2 96% VS noted,  Constitutional: Pt appears in no significant distress HENT: Head: NCAT.  Right Ear: External ear normal.  Left Ear: External ear normal.  Eyes: . Pupils are equal, round, and reactive to light. Conjunctivae and EOM are normal Neck: Normal range of motion. Neck supple.  Cardiovascular: Normal rate and regular rhythm.   Pulmonary/Chest: Effort normal and breath sounds without rales or wheezing.  Neurological: Pt is alert. Not confused , motor grossly intact Skin: Skin is warm. No rash, no LE edema Psychiatric: Pt behavior is normal. No agitation.     Assessment & Plan:

## 2015-04-05 NOTE — Assessment & Plan Note (Signed)
I suspect most likely related to irritation from sleeping in dry environment, for humdifier at bedside at night, ok also to change the flonase to nasacort though seems likely related, check cbc, INR, and refer ENT as has occurred mult times

## 2015-04-05 NOTE — Progress Notes (Signed)
Pre visit review using our clinic review tool, if applicable. No additional management support is needed unless otherwise documented below in the visit note. 

## 2015-04-05 NOTE — Patient Instructions (Signed)
Please use a humidifier at the bedside at night if you can  OK to stop the flonase  Please take all new medication as prescribed - the Nasacort AQ  Please continue all other medications as before, including the aspirin for now  Please have the pharmacy call with any other refills you may need.  Please keep your appointments with your specialists as you may have planned  You will be contacted regarding the referral for: ENT  Please go to the LAB in the Basement (turn left off the elevator) for the tests to be done today  You will be contacted by phone if any changes need to be made immediately.  Otherwise, you will receive a letter about your results with an explanation, but please check with MyChart first.  Please remember to sign up for MyChart if you have not done so, as this will be important to you in the future with finding out test results, communicating by private email, and scheduling acute appointments online when needed.

## 2015-04-05 NOTE — Assessment & Plan Note (Signed)
stable overall by history and exam, recent data reviewed with pt, and pt to continue medical treatment as before,  to f/u any worsening symptoms or concerns BP Readings from Last 3 Encounters:  04/05/15 144/70  03/09/15 138/68  01/18/15 166/74

## 2015-04-05 NOTE — Assessment & Plan Note (Signed)
Last cr July 2016 - ok for fu lab today as well

## 2015-04-20 DIAGNOSIS — R040 Epistaxis: Secondary | ICD-10-CM | POA: Diagnosis not present

## 2015-04-26 ENCOUNTER — Ambulatory Visit (INDEPENDENT_AMBULATORY_CARE_PROVIDER_SITE_OTHER): Payer: Medicare Other | Admitting: Internal Medicine

## 2015-04-26 ENCOUNTER — Encounter: Payer: Self-pay | Admitting: Internal Medicine

## 2015-04-26 ENCOUNTER — Ambulatory Visit (INDEPENDENT_AMBULATORY_CARE_PROVIDER_SITE_OTHER)
Admission: RE | Admit: 2015-04-26 | Discharge: 2015-04-26 | Disposition: A | Discharge status: Home / Self Care | Payer: Medicare Other | Source: Ambulatory Visit | Attending: Internal Medicine | Admitting: Internal Medicine

## 2015-04-26 VITALS — BP 144/76 | HR 56 | Temp 97.6°F | Resp 18 | Wt 180.0 lb

## 2015-04-26 DIAGNOSIS — J441 Chronic obstructive pulmonary disease with (acute) exacerbation: Secondary | ICD-10-CM

## 2015-04-26 DIAGNOSIS — G609 Hereditary and idiopathic neuropathy, unspecified: Secondary | ICD-10-CM

## 2015-04-26 DIAGNOSIS — N289 Disorder of kidney and ureter, unspecified: Secondary | ICD-10-CM

## 2015-04-26 DIAGNOSIS — I2510 Atherosclerotic heart disease of native coronary artery without angina pectoris: Secondary | ICD-10-CM

## 2015-04-26 DIAGNOSIS — E1122 Type 2 diabetes mellitus with diabetic chronic kidney disease: Secondary | ICD-10-CM | POA: Insufficient documentation

## 2015-04-26 DIAGNOSIS — E119 Type 2 diabetes mellitus without complications: Secondary | ICD-10-CM | POA: Insufficient documentation

## 2015-04-26 DIAGNOSIS — I10 Essential (primary) hypertension: Secondary | ICD-10-CM

## 2015-04-26 DIAGNOSIS — E1169 Type 2 diabetes mellitus with other specified complication: Secondary | ICD-10-CM | POA: Insufficient documentation

## 2015-04-26 DIAGNOSIS — E785 Hyperlipidemia, unspecified: Secondary | ICD-10-CM

## 2015-04-26 DIAGNOSIS — R7303 Prediabetes: Secondary | ICD-10-CM

## 2015-04-26 DIAGNOSIS — J439 Emphysema, unspecified: Secondary | ICD-10-CM | POA: Diagnosis not present

## 2015-04-26 MED ORDER — ENALAPRIL MALEATE 10 MG PO TABS: 15.0000 mg | ORAL_TABLET | Freq: Every day | ORAL | Status: DC

## 2015-04-26 MED ORDER — TEMAZEPAM 7.5 MG PO CAPS: 7.5000 mg | ORAL_CAPSULE | Freq: Every evening | ORAL | Status: DC | PRN

## 2015-04-26 MED ORDER — PREDNISONE 20 MG PO TABS: 40.0000 mg | ORAL_TABLET | Freq: Every day | ORAL | Status: DC

## 2015-04-26 MED ORDER — SIMVASTATIN 40 MG PO TABS: 40.0000 mg | ORAL_TABLET | Freq: Every day | ORAL | Status: DC

## 2015-04-26 NOTE — Assessment & Plan Note (Signed)
Slightly elevated here today, but well controlled at home Continue home monitoring  Continue current medications

## 2015-04-26 NOTE — Assessment & Plan Note (Signed)
no current symptoms Monitor for now

## 2015-04-26 NOTE — Assessment & Plan Note (Signed)
Taking simvastatin 40 mg daily Lipid panel controlled - done by [facility]

## 2015-04-26 NOTE — Assessment & Plan Note (Signed)
Ongoing for over two months - no overall improvement Slightly temporary improvement with albuterol cxr to rule out infection Will do prednisone 40 mg daily for 5 days - discussed concern with increasing his sugars temporarily but his diabetes is well controlled and I think the benefits outweigh the risks.  I think this will be more effective than oral steroids at this  Continue albuterol as needed, but use should decrease over the next few days Call if no improvement

## 2015-04-26 NOTE — Progress Notes (Signed)
Subjective:    Patient ID: Brian [name], male    DOB: 1931/02/23, 82 y.o.   MRN: [id number]  HPI He is here to establish with a new pcp.   He is here for follow up.  COPD:  He had a flare of his COPD that started in December.  He went to the [place], but not until January.  He gets very SOB with mild exertion, has a dry cough and wheeze.  He has never had a copd exacerbation like this before.  There has been no improvement since December.  He has never used an inhaler, but when he saw the [place] they prescribed albuterol.  He is using the inhaler  at least 4 times a day.  It does help, but he sometimes needs it more than 4 tims a day.  He still has a dry cough, wheezing that wakes him up at night, and dyspnea with exertion with walking from the parking lot.  When his copd excacerbation started he did not have cold symptoms.     Diabetes: He is not taking any medication now - he was on medication in the past. He is compliant with a diabetic diet. He is exercising regularly, except for the past couple of months due to his COPD. He monitors his sugars and they have been running 120's. He checks his feet daily and denies foot lesions. He is up-to-date with an ophthalmology examination.   CAD, Hypertension: He is taking his medication daily. He is compliant with a low sodium diet.  He denies chest pain, edema,and regular headaches. He is exercising regularly.  He does monitor his blood pressure at home, 133/73 this morning.    Hyperlipidemia: He is taking his medication daily. He is compliant with a low fat/cholesterol diet. He is exercising regularly. He denies myalgias.   Renal insufficiency:  His most recent blood work showed normal renal function, but he has had some mild insufficiency in the recent past.  He does not take nsaids.  He tries to drink plenty of fluids throughout the day, but is not always successful.  Sleep difficulties:  He uses restoril sometimes.  He sometimes wakes up a couple of  hours after he goes to sleep and will sometimes take the medication then.  He does not take it nightly.  Medications and allergies reviewed with patient and updated if appropriate.  Patient Active Problem List   Diagnosis Date Noted  . Epistaxis 04/05/2015  . AAA (abdominal aortic aneurysm) without rupture ([place]) 01/18/2015  . PAD (peripheral artery disease) ([place]) 01/18/2015  . Myalgia and myositis 07/02/2014  . Renal insufficiency 04/20/2014  . Unspecified hereditary and idiopathic peripheral neuropathy 10/29/2012  . Hypertension 05/10/2011  . Bradycardia 05/10/2011  . CAD (coronary artery disease) 09/05/2010  . TINNITUS 04/04/2010  . CORONARY ARTERY BYPASS GRAFT, HX OF 03/31/2010  . SLEEP DISORDER 12/28/2008  . Cervicalgia 07/26/2008  . POLYP, COLON 07/16/2007  . Pre-diabetes 09/24/2006  . Hyperlipidemia 09/24/2006  . History of gout 09/23/2006    Current Outpatient Prescriptions on File Prior to Visit  Medication Sig Dispense Refill  . ACCU-CHEK COMPACT STRIPS test strip TEST BLOOD SUGAR ONCE DAILY 100 each 1  . Aspirin (ADULT ASPIRIN LOW STRENGTH) 81 MG EC tablet Take 81 mg by mouth daily.      . capsaicin (ZOSTRIX) 0.025 % cream Apply 1 application topically as needed.    . capsicum oleoresin (TRIXAICIN) 0.025 % cream Apply 1 application topically as needed.    Marland Kitchen  cetirizine (ZYRTEC) 10 MG tablet Take 10 mg by mouth daily.    . cholecalciferol (VITAMIN D) 1000 UNITS tablet 2 ;000 units daily    . enalapril (VASOTEC) 10 MG tablet TAKE ONE AND ONE-HALF TABLETS DAILY 135 tablet 2  . Magnesium Oxide 420 MG TABS Take 420 mg by mouth daily. 420 mg by mouth daily    . Multiple Vitamins-Minerals (MULTIVITAL PO) Take by mouth daily.    . simvastatin (ZOCOR) 40 MG tablet Take 1 tablet (40 mg total) by mouth at bedtime. 90 tablet 1  . temazepam (RESTORIL) 7.5 MG capsule Take 7.5 mg by mouth at bedtime as needed.    . TOPROL XL 25 MG 24 hr tablet TAKE ONE-HALF (1/2) TABLET DAILY 45  tablet 0  . triamcinolone (NASACORT AQ) 55 MCG/ACT AERO nasal inhaler Place 2 sprays into the nose daily. 1 Inhaler 12   No current facility-administered medications on file prior to visit.    Past Medical History  Diagnosis Date  . CAD (coronary artery disease)     NSTEMI 02/2010;  s/p CABG 1/12 (L-LAD, SOM1, S-PDA); echo in 08/2010: Mild LVH, EF 76%, grade 2 diastolic dysfunction, mild MR, mild LAE, mildly reduced RV function, mild RAE, PASP 42  . Diabetes mellitus 2000    adult onset  . Gout   . ED (erectile dysfunction)   . Hyperlipidemia   . Colon polyps   . Macular degeneration   . AAA (abdominal aortic aneurysm) ([place])   . Asthma 1976  . HTN (hypertension)   . COPD (chronic obstructive pulmonary disease) ([place])   . Myocardial infarction ([place]) 1 -13-2012  . Neuropathy ([place])   . Bronchiolitis May 2016    Past Surgical History  Procedure Laterality Date  . Incision and drainage perirectal abscess    . Colonoscopy      neg 2009  . Inguinal hernia repair    . Cataract left eye    . 3 vessel cabg  03/03/2010  . Coronary artery bypass graft  2012    X3  . Colonoscopy  2014    5 mm sessile polyp; AVM  . Eye surgery      Social History   Social History  . Marital Status: Widowed    Spouse Name: N/A  . Number of Children: N/A  . Years of Education: N/A   Occupational History  . RETIRED    Social History Main Topics  . Smoking status: Former Smoker    Types: Cigarettes    Quit date: 02/20/2003  . Smokeless tobacco: Never Used  . Alcohol Use: 3.6 oz/week    3 Glasses of wine, 3 Shots of liquor per week     Comment:  1-3 drinks a week  . Drug Use: No  . Sexual Activity: Not Asked   Other Topics Concern  . None   Social History Narrative   Quit smoking in 1998. 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     Family History  Problem Relation Age of Onset  . Heart failure Mother   . Heart disease Mother     CHF  . Heart attack Mother   . Hypertension Mother    . Heart attack Father   . Heart disease Father 18    MI  . Hypertension Father   . Heart attack Sister 49  . Heart disease Sister 33    MI  . Diabetes Sister   . Hyperlipidemia Sister   . Hypertension Sister   . Varicose  Veins Sister   . Heart attack Brother 64  . Diabetes Brother   . Heart disease Brother 42    MI  . Cancer Brother   . Hypertension Brother   . Deep vein thrombosis Daughter   . Other Son     varicose veins  . Colon cancer Neg Hx     Review of Systems  Constitutional: Negative for fever, chills and appetite change.  HENT: Negative for congestion, ear pain and sore throat.   Respiratory: Positive for cough, shortness of breath and wheezing.   Cardiovascular: Positive for palpitations (when he lays down - he can't breath well). Negative for chest pain and leg swelling.  Gastrointestinal: Negative for nausea and abdominal pain.       No GERD  Musculoskeletal: Positive for myalgias (leg cramping).  Neurological: Negative for dizziness, light-headedness and headaches.       Objective:   Filed Vitals:   04/26/15 0826  BP: 144/76  Pulse: 56  Temp: 97.6 F (36.4 C)  Resp: 18   Filed Weights   04/26/15 0826  Weight: 180 lb (81.647 kg)   Body mass index is 25.12 kg/(m^2).   Physical Exam Constitutional: Appears well-developed and well-nourished. No distress.  Neck: b/l ear canal with moderate amount of cerumen, no ear canal erythema, TM's only partially visualized, oropharynx without erythema or exudate, Neck supple. No tracheal deviation present. No thyromegaly present.  No carotid bruit. No cervical adenopathy.   Cardiovascular: Normal rate, regular rhythm and normal heart sounds.   No murmur heard.  No edema Pulmonary/Chest: Effort normal, good air entry, no crackles, diffuse mild expiratory wheeze.  No respiratory distress. Abd: soft, nontender       Assessment & Plan:   See Problem List for Assessment and Plan of chronic medical  problems.

## 2015-04-26 NOTE — Progress Notes (Signed)
Pre visit review using our clinic review tool, if applicable. No additional management support is needed unless otherwise documented below in the visit note. 

## 2015-04-26 NOTE — Assessment & Plan Note (Signed)
Last renal function through [place] is normal Avoid nsaids Continue good fluid intake monitor

## 2015-04-26 NOTE — Assessment & Plan Note (Signed)
Recent a1c through the [facility] 6.4% Controlled with lifestyle Continue diabetic diet and regular exercise Blood work monitored through the [place]

## 2015-04-26 NOTE — Assessment & Plan Note (Addendum)
S/p CABG in 2012 On ASA, statin, BB No concerning symptoms Follows with cardiology

## 2015-04-26 NOTE — Patient Instructions (Addendum)
Have a chest xray done today.   Test(s) ordered today. Your results will be released to [place] (or called to you) after review, usually within 72hours after test completion. If any changes need to be made, you will be notified at that same time.   Medications reviewed and updated.  Changes include taking a steroid for 5 days.  Your prescription(s) have been submitted to your pharmacy. Please take as directed and contact our office if you believe you are having problem(s) with the medication(s).

## 2015-04-27 NOTE — Progress Notes (Signed)
Chief Complaint  Patient presents with  . Shortness of Breath      History of Present Illness: 82 yo AAM with h/o HTN, hyperlipidemia, DM and CAD here today for cardiac follow up. He was admitted to [facility] January 2012 with a NSTEMI. He underwent cardiac cath on 02/28/10 demonstrating triple vessel CAD. 3V CABG on 03/03/10 per Dr. Prescott Gum. AAA followed in [facility], last seen there November 2016. Echo July 2012 with normal LV size and function, mild MR.   He is here today for follow up. He has been doing well. No chest pain, palpitations, dizziness, near syncope or syncope. He has been taking all of his medications. He has been walking 35-40 minutes 4-5 days per week.  He has baseline SOB that he feels is due to COPD. No recent changes. No fevers. No LE edema.   Primary Care Physician: Binnie Rail, MD  Past Medical History  Diagnosis Date  . CAD (coronary artery disease)     NSTEMI 02/2010;  s/p CABG 1/12 (L-LAD, SOM1, S-PDA); echo in 08/2010: Mild LVH, EF 37%, grade 2 diastolic dysfunction, mild MR, mild LAE, mildly reduced RV function, mild RAE, PASP 42  . Diabetes mellitus 2000    adult onset  . Gout   . ED (erectile dysfunction)   . Hyperlipidemia   . Colon polyps   . Macular degeneration   . AAA (abdominal aortic aneurysm) ([place])   . Asthma 1976  . HTN (hypertension)   . COPD (chronic obstructive pulmonary disease) ([place])   . Myocardial infarction ([place]) 1 -13-2012  . Neuropathy ([place])   . Bronchiolitis May 2016    Past Surgical History  Procedure Laterality Date  . Incision and drainage perirectal abscess    . Colonoscopy      neg 2009  . Inguinal hernia repair    . Cataract left eye    . 3 vessel cabg  03/03/2010  . Coronary artery bypass graft  2012    X3  . Colonoscopy  2014    5 mm sessile polyp; AVM  . Eye surgery      Current Outpatient Prescriptions  Medication Sig Dispense Refill  . ACCU-CHEK COMPACT STRIPS test strip TEST BLOOD SUGAR ONCE DAILY  100 each 1  . albuterol (PROVENTIL HFA;VENTOLIN HFA) 108 (90 Base) MCG/ACT inhaler Inhale 2 puffs into the lungs every 6 (six) hours as needed for wheezing or shortness of breath.    . Aspirin (ADULT ASPIRIN LOW STRENGTH) 81 MG EC tablet Take 81 mg by mouth daily.      . capsaicin (ZOSTRIX) 0.025 % cream Apply 1 application topically as needed.    . capsicum oleoresin (TRIXAICIN) 0.025 % cream Apply 1 application topically as needed.    . cetirizine (ZYRTEC) 10 MG tablet Take 10 mg by mouth daily.    . cholecalciferol (VITAMIN D) 1000 UNITS tablet 2 ;000 units daily    . enalapril (VASOTEC) 10 MG tablet Take 1.5 tablets (15 mg total) by mouth daily. 135 tablet 3  . Magnesium Oxide 420 MG TABS Take 420 mg by mouth daily. 420 mg by mouth daily    . Multiple Vitamins-Minerals (MULTIVITAL PO) Take by mouth daily.    . predniSONE (DELTASONE) 20 MG tablet Take 2 tablets (40 mg total) by mouth daily with breakfast. 10 tablet 0  . simvastatin (ZOCOR) 40 MG tablet Take 1 tablet (40 mg total) by mouth at bedtime. 90 tablet 3  . temazepam (RESTORIL) 7.5  MG capsule Take 1 capsule (7.5 mg total) by mouth at bedtime as needed. 90 capsule 0  . TOPROL XL 25 MG 24 hr tablet TAKE ONE-HALF (1/2) TABLET DAILY 45 tablet 0  . triamcinolone (NASACORT AQ) 55 MCG/ACT AERO nasal inhaler Place 2 sprays into the nose daily. 1 Inhaler 12   No current facility-administered medications for this visit.    Allergies  Allergen Reactions  . Meperidine Hcl     Tongue swelling Because of a history of documented adverse serious drug reaction;Medi Alert bracelet  is recommended  . Zolpidem Tartrate     REACTION: Crazy dreams    Social History   Social History  . Marital Status: Widowed    Spouse Name: N/A  . Number of Children: N/A  . Years of Education: N/A   Occupational History  . RETIRED    Social History Main Topics  . Smoking status: Former Smoker    Types: Cigarettes    Quit date: 02/20/2003  . Smokeless  tobacco: Never Used  . Alcohol Use: 3.6 oz/week    3 Glasses of wine, 3 Shots of liquor per week     Comment:  1-3 drinks a week  . Drug Use: No  . Sexual Activity: Not on file   Other Topics Concern  . Not on file   Social History Narrative   Quit smoking in 1998. 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     Family History  Problem Relation Age of Onset  . Heart failure Mother   . Heart disease Mother     CHF  . Heart attack Mother   . Hypertension Mother   . Heart attack Father   . Heart disease Father 73    MI  . Hypertension Father   . Heart attack Sister 52  . Heart disease Sister 58    MI  . Diabetes Sister   . Hyperlipidemia Sister   . Hypertension Sister   . Varicose Veins Sister   . Heart attack Brother 70  . Diabetes Brother   . Heart disease Brother 4    MI  . Cancer Brother   . Hypertension Brother   . Deep vein thrombosis Daughter   . Other Son     varicose veins  . Colon cancer Neg Hx     Review of Systems:  As stated in the HPI and otherwise negative.   BP 160/82 mmHg  Pulse 56  Ht '5\' 11"'$  (1.803 m)  Wt 184 lb 1.9 oz (83.516 kg)  BMI 25.69 kg/m2  Physical Examination: General: Well developed, well nourished, NAD HEENT: OP clear, mucus membranes moist SKIN: warm, dry. No rashes. Neuro: No focal deficits Musculoskeletal: Muscle strength 5/5 all ext Psychiatric: Mood and affect normal Neck: No JVD, no carotid bruits, no thyromegaly, no lymphadenopathy. Lungs:Clear bilaterally, no wheezes, rhonci, crackles Cardiovascular: Regular rate and rhythm. No murmurs, gallops or rubs. Abdomen:Soft. Bowel sounds present. Non-tender.  Extremities: No lower extremity edema. Pulses are 2 + in the bilateral DP/PT.  Echo July 2012:  Left ventricle: The cavity size was normal. Wall thickness was increased in a pattern of mild LVH. Septal bounce consistent with prior cardiac surgery. Systolic function was normal. The estimated ejection fraction was 55%.  Wall motion was normal; there were no regional wall motion abnormalities. Features are consistent with a pseudonormal left ventricular filling pattern, with concomitant abnormal relaxation and increased filling pressure (grade 2 diastolic dysfunction). E/medial e' > 15 suggests LV end diastolic pressure at  least 20 mmHg. Septal thickness:72m (ED, 2D). - Aortic valve: There was no stenosis. - Mitral valve: Mildly calcified annulus. Mild regurgitation. - Left atrium: The atrium was mildly dilated. - Right ventricle: The cavity size was normal. Systolic function was mildly reduced. - Right atrium: The atrium was mildly dilated. - Tricuspid valve: Peak RV-RA gradient: 35mHg (S). - Pulmonary arteries: PA peak pressure: 429mg (S). - Systemic veins: IVC measured 1.8 cm with some respirophasic variation, suggesting RA pressure 10 mmHg. Impressions:  - Normal LV size with mild LV hypertrophy. Septal bounce consistent with prior cardiac surgery. EF 55%. Moderate diastolic dysfunction with evidence for elevated LV filling pressure. Normal RV size with mildly decreased systolic function. Mild pulmonary hypertension. Mild MR.  EKG:  EKG is ordered today. The ekg ordered today demonstrates Sinus brady, rate 56 bpm. PACs  Recent Labs: 04/05/2015: BUN 24*; Creatinine, Ser 1.32; Hemoglobin 13.6; Platelets 211.0; Potassium 4.6; Sodium 139   Lipid Panel    Component Value Date/Time   CHOL 139 10/29/2012 1005   TRIG 89.0 10/29/2012 1005   HDL 43.90 10/29/2012 1005   CHOLHDL 3 10/29/2012 1005   VLDL 17.8 10/29/2012 1005   LDLCALC 77 10/29/2012 1005     Wt Readings from Last 3 Encounters:  04/28/15 184 lb 1.9 oz (83.516 kg)  04/26/15 180 lb (81.647 kg)  04/05/15 188 lb 9 oz (85.531 kg)     Other studies Reviewed: Additional studies/ records that were reviewed today include: . Review of the above records demonstrates:    Assessment and Plan:   1. CAD: Doing well s/p CABG in January  2012. He is on good medical therapy. He is tolerating all meds. EKG personally reviewed by me today.   2. HTN: BP is elevated today but has been well controlled at home. I have reviewed his medications in detail with him today. He will follow at home and let me know if it remains elevated. I reviewed his BMET from last month. Renal function is stable. No changes.   3. Hyperlipidemia: He is on a statin. [facility]  4. Dyspnea: Likely due to COPD but will repeat echo now to assess LV function since it has been 5 years since his bypass and he has had no assessment of LVEF since then.   Current medicines are reviewed at length with the patient today.  The patient does not have concerns regarding medicines.  The following changes have been made:  no change  Labs/ tests ordered today include:   Orders Placed This Encounter  Procedures  . EKG 12-Lead  . Echocardiogram    Disposition:   FU with me in 12  months  Signed, ChrLauree ChandlerD 04/28/2015 9:33 AM    [facility] [id number]  [id number]: [phone]: [phone]

## 2015-04-28 ENCOUNTER — Ambulatory Visit (INDEPENDENT_AMBULATORY_CARE_PROVIDER_SITE_OTHER): Payer: Medicare Other | Admitting: Cardiovascular Disease

## 2015-04-28 ENCOUNTER — Encounter: Payer: Self-pay | Admitting: Cardiovascular Disease

## 2015-04-28 VITALS — BP 160/82 | HR 56 | Ht 71.0 in | Wt 184.1 lb

## 2015-04-28 DIAGNOSIS — E785 Hyperlipidemia, unspecified: Secondary | ICD-10-CM | POA: Diagnosis not present

## 2015-04-28 DIAGNOSIS — R0600 Dyspnea, unspecified: Secondary | ICD-10-CM | POA: Diagnosis not present

## 2015-04-28 DIAGNOSIS — I10 Essential (primary) hypertension: Secondary | ICD-10-CM

## 2015-04-28 DIAGNOSIS — I25810 Atherosclerosis of coronary artery bypass graft(s) without angina pectoris: Secondary | ICD-10-CM | POA: Diagnosis not present

## 2015-04-28 NOTE — Patient Instructions (Signed)

## 2015-04-29 ENCOUNTER — Other Ambulatory Visit: Payer: Self-pay | Admitting: Cardiovascular Disease

## 2015-05-12 ENCOUNTER — Encounter: Payer: Self-pay | Admitting: Internal Medicine

## 2015-05-12 ENCOUNTER — Other Ambulatory Visit: Payer: Self-pay

## 2015-05-12 ENCOUNTER — Ambulatory Visit (HOSPITAL_COMMUNITY): Payer: Medicare Other | Attending: Cardiology

## 2015-05-12 DIAGNOSIS — I119 Hypertensive heart disease without heart failure: Secondary | ICD-10-CM | POA: Diagnosis not present

## 2015-05-12 DIAGNOSIS — I2510 Atherosclerotic heart disease of native coronary artery without angina pectoris: Secondary | ICD-10-CM | POA: Insufficient documentation

## 2015-05-12 DIAGNOSIS — R0600 Dyspnea, unspecified: Secondary | ICD-10-CM | POA: Diagnosis not present

## 2015-05-12 DIAGNOSIS — E119 Type 2 diabetes mellitus without complications: Secondary | ICD-10-CM | POA: Diagnosis not present

## 2015-05-12 DIAGNOSIS — Z951 Presence of aortocoronary bypass graft: Secondary | ICD-10-CM | POA: Insufficient documentation

## 2015-05-12 DIAGNOSIS — I071 Rheumatic tricuspid insufficiency: Secondary | ICD-10-CM | POA: Insufficient documentation

## 2015-05-12 DIAGNOSIS — I252 Old myocardial infarction: Secondary | ICD-10-CM | POA: Diagnosis not present

## 2015-05-12 DIAGNOSIS — I340 Nonrheumatic mitral (valve) insufficiency: Secondary | ICD-10-CM | POA: Insufficient documentation

## 2015-05-12 DIAGNOSIS — E785 Hyperlipidemia, unspecified: Secondary | ICD-10-CM | POA: Diagnosis not present

## 2015-05-12 DIAGNOSIS — I714 Abdominal aortic aneurysm, without rupture: Secondary | ICD-10-CM | POA: Diagnosis not present

## 2015-05-16 ENCOUNTER — Telehealth: Payer: Self-pay | Admitting: Cardiovascular Disease

## 2015-05-16 NOTE — Telephone Encounter (Signed)
New message   Pt is calling for ECHO results he is returning call for rn

## 2015-05-16 NOTE — Telephone Encounter (Signed)
Spoke with pt and reviewed echo results with him.  

## 2015-05-30 ENCOUNTER — Emergency Department (HOSPITAL_COMMUNITY): Payer: Medicare Other

## 2015-05-30 ENCOUNTER — Observation Stay (HOSPITAL_COMMUNITY)
Admission: EM | Admit: 2015-05-30 | Discharge: 2015-06-02 | Disposition: A | Discharge status: Home / Self Care | Payer: Medicare Other | Attending: Internal Medicine | Admitting: Internal Medicine

## 2015-05-30 ENCOUNTER — Encounter (HOSPITAL_COMMUNITY): Payer: Self-pay | Admitting: Emergency Medicine

## 2015-05-30 ENCOUNTER — Encounter (HOSPITAL_COMMUNITY): Payer: Self-pay | Admitting: *Deleted

## 2015-05-30 ENCOUNTER — Ambulatory Visit (INDEPENDENT_AMBULATORY_CARE_PROVIDER_SITE_OTHER)
Admission: EM | Admit: 2015-05-30 | Discharge: 2015-05-30 | Disposition: A | Discharge status: Nursing Home (Includes ICF) | Payer: Medicare Other | Source: Home / Self Care | Attending: Emergency Medicine | Admitting: Emergency Medicine

## 2015-05-30 DIAGNOSIS — I2510 Atherosclerotic heart disease of native coronary artery without angina pectoris: Secondary | ICD-10-CM | POA: Diagnosis not present

## 2015-05-30 DIAGNOSIS — N189 Chronic kidney disease, unspecified: Secondary | ICD-10-CM | POA: Diagnosis present

## 2015-05-30 DIAGNOSIS — I10 Essential (primary) hypertension: Secondary | ICD-10-CM | POA: Diagnosis present

## 2015-05-30 DIAGNOSIS — M109 Gout, unspecified: Secondary | ICD-10-CM | POA: Insufficient documentation

## 2015-05-30 DIAGNOSIS — R059 Cough, unspecified: Secondary | ICD-10-CM

## 2015-05-30 DIAGNOSIS — N529 Male erectile dysfunction, unspecified: Secondary | ICD-10-CM | POA: Insufficient documentation

## 2015-05-30 DIAGNOSIS — G629 Polyneuropathy, unspecified: Secondary | ICD-10-CM | POA: Diagnosis not present

## 2015-05-30 DIAGNOSIS — Z951 Presence of aortocoronary bypass graft: Secondary | ICD-10-CM | POA: Diagnosis not present

## 2015-05-30 DIAGNOSIS — K635 Polyp of colon: Secondary | ICD-10-CM | POA: Insufficient documentation

## 2015-05-30 DIAGNOSIS — R6510 Systemic inflammatory response syndrome (SIRS) of non-infectious origin without acute organ dysfunction: Secondary | ICD-10-CM | POA: Diagnosis not present

## 2015-05-30 DIAGNOSIS — N1832 Chronic kidney disease, stage 3b: Secondary | ICD-10-CM | POA: Diagnosis present

## 2015-05-30 DIAGNOSIS — N183 Chronic kidney disease, stage 3 unspecified: Secondary | ICD-10-CM | POA: Diagnosis present

## 2015-05-30 DIAGNOSIS — E785 Hyperlipidemia, unspecified: Secondary | ICD-10-CM | POA: Insufficient documentation

## 2015-05-30 DIAGNOSIS — E1159 Type 2 diabetes mellitus with other circulatory complications: Secondary | ICD-10-CM | POA: Diagnosis present

## 2015-05-30 DIAGNOSIS — K529 Noninfective gastroenteritis and colitis, unspecified: Secondary | ICD-10-CM

## 2015-05-30 DIAGNOSIS — R531 Weakness: Secondary | ICD-10-CM

## 2015-05-30 DIAGNOSIS — K8020 Calculus of gallbladder without cholecystitis without obstruction: Secondary | ICD-10-CM | POA: Diagnosis not present

## 2015-05-30 DIAGNOSIS — R509 Fever, unspecified: Secondary | ICD-10-CM

## 2015-05-30 DIAGNOSIS — H3530 Unspecified macular degeneration: Secondary | ICD-10-CM | POA: Diagnosis not present

## 2015-05-30 DIAGNOSIS — R0989 Other specified symptoms and signs involving the circulatory and respiratory systems: Secondary | ICD-10-CM

## 2015-05-30 DIAGNOSIS — J449 Chronic obstructive pulmonary disease, unspecified: Secondary | ICD-10-CM | POA: Diagnosis present

## 2015-05-30 DIAGNOSIS — R197 Diarrhea, unspecified: Secondary | ICD-10-CM

## 2015-05-30 DIAGNOSIS — N1831 Chronic kidney disease, stage 3a: Secondary | ICD-10-CM | POA: Diagnosis present

## 2015-05-30 DIAGNOSIS — Z79899 Other long term (current) drug therapy: Secondary | ICD-10-CM | POA: Insufficient documentation

## 2015-05-30 DIAGNOSIS — E119 Type 2 diabetes mellitus without complications: Secondary | ICD-10-CM

## 2015-05-30 DIAGNOSIS — I252 Old myocardial infarction: Secondary | ICD-10-CM | POA: Diagnosis not present

## 2015-05-30 DIAGNOSIS — Z87891 Personal history of nicotine dependence: Secondary | ICD-10-CM | POA: Diagnosis not present

## 2015-05-30 DIAGNOSIS — Z7952 Long term (current) use of systemic steroids: Secondary | ICD-10-CM | POA: Insufficient documentation

## 2015-05-30 DIAGNOSIS — N179 Acute kidney failure, unspecified: Secondary | ICD-10-CM | POA: Diagnosis present

## 2015-05-30 DIAGNOSIS — R112 Nausea with vomiting, unspecified: Secondary | ICD-10-CM | POA: Diagnosis not present

## 2015-05-30 DIAGNOSIS — I714 Abdominal aortic aneurysm, without rupture: Secondary | ICD-10-CM | POA: Diagnosis not present

## 2015-05-30 DIAGNOSIS — Z7982 Long term (current) use of aspirin: Secondary | ICD-10-CM | POA: Diagnosis not present

## 2015-05-30 DIAGNOSIS — R05 Cough: Secondary | ICD-10-CM

## 2015-05-30 DIAGNOSIS — N184 Chronic kidney disease, stage 4 (severe): Secondary | ICD-10-CM | POA: Diagnosis present

## 2015-05-30 DIAGNOSIS — E1169 Type 2 diabetes mellitus with other specified complication: Secondary | ICD-10-CM

## 2015-05-30 DIAGNOSIS — E1122 Type 2 diabetes mellitus with diabetic chronic kidney disease: Secondary | ICD-10-CM

## 2015-05-30 LAB — COMPREHENSIVE METABOLIC PANEL
ALBUMIN: 3.4 g/dL — AB (ref 3.5–5.0)
ALT: 19 U/L (ref 17–63)
AST: 28 U/L (ref 15–41)
Alkaline Phosphatase: 49 U/L (ref 38–126)
Anion gap: 11 (ref 5–15)
BUN: 23 mg/dL — ABNORMAL HIGH (ref 6–20)
CHLORIDE: 110 mmol/L (ref 101–111)
CO2: 20 mmol/L — ABNORMAL LOW (ref 22–32)
Calcium: 8.4 mg/dL — ABNORMAL LOW (ref 8.9–10.3)
Creatinine, Ser: 1.71 mg/dL — ABNORMAL HIGH (ref 0.61–1.24)
GFR calc Af Amer: 41 mL/min — ABNORMAL LOW (ref >60)
GFR calc non Af Amer: 36 mL/min — ABNORMAL LOW (ref >60)
Glucose, Bld: 144 mg/dL — ABNORMAL HIGH (ref 65–99)
POTASSIUM: 3.6 mmol/L (ref 3.5–5.1)
Sodium: 141 mmol/L (ref 135–145)
TOTAL PROTEIN: 6.3 g/dL — AB (ref 6.5–8.1)
Total Bilirubin: 0.8 mg/dL (ref 0.3–1.2)

## 2015-05-30 LAB — CBC WITH DIFFERENTIAL/PLATELET
BASOS ABS: 0 10*3/uL (ref 0.0–0.1)
Basophils Relative: 0 %
EOS PCT: 0 %
Eosinophils Absolute: 0 10*3/uL (ref 0.0–0.7)
HEMATOCRIT: 40.5 % (ref 39.0–52.0)
HEMOGLOBIN: 13.5 g/dL (ref 13.0–17.0)
LYMPHS PCT: 3 %
Lymphs Abs: 0.3 10*3/uL — ABNORMAL LOW (ref 0.7–4.0)
MCH: 30.9 pg (ref 26.0–34.0)
MCHC: 33.3 g/dL (ref 30.0–36.0)
MCV: 92.7 fL (ref 78.0–100.0)
Monocytes Absolute: 0.3 10*3/uL (ref 0.1–1.0)
Monocytes Relative: 2 %
NEUTROS ABS: 12.4 10*3/uL — AB (ref 1.7–7.7)
NEUTROS PCT: 95 %
Platelets: 185 10*3/uL (ref 150–400)
RBC: 4.37 MIL/uL (ref 4.22–5.81)
RDW: 13 % (ref 11.5–15.5)
WBC: 13 10*3/uL — AB (ref 4.0–10.5)

## 2015-05-30 LAB — I-STAT CG4 LACTIC ACID, ED: LACTIC ACID, VENOUS: 1.09 mmol/L (ref 0.5–2.0)

## 2015-05-30 MED ORDER — SODIUM CHLORIDE 0.9 % IV BOLUS (SEPSIS)
500.0000 mL | INTRAVENOUS | Status: AC
  Administered 2015-05-31: 500 mL via INTRAVENOUS

## 2015-05-30 MED ORDER — IOPAMIDOL (ISOVUE-300) INJECTION 61%
INTRAVENOUS | Status: AC
  Administered 2015-05-30: 75 mL
  Filled 2015-05-30: qty 75

## 2015-05-30 MED ORDER — ACETAMINOPHEN 325 MG PO TABS
ORAL_TABLET | ORAL | Status: AC
  Filled 2015-05-30: qty 3

## 2015-05-30 MED ORDER — SODIUM CHLORIDE 0.9 % IV BOLUS (SEPSIS)
1000.0000 mL | INTRAVENOUS | Status: AC
  Administered 2015-05-30 (×2): 1000 mL via INTRAVENOUS

## 2015-05-30 MED ORDER — ACETAMINOPHEN 325 MG PO TABS
975.0000 mg | ORAL_TABLET | Freq: Once | ORAL | Status: AC
Start: 2015-05-30 — End: 2015-05-30
  Administered 2015-05-30: 975 mg via ORAL

## 2015-05-30 NOTE — ED Provider Notes (Signed)
CSN: [id number]     Arrival date & time 05/30/15  1849 History   First MD Initiated Contact with Patient 05/30/15 2010     Chief Complaint  Patient presents with  . Fever   (Consider location/radiation/quality/duration/timing/severity/associated sxs/prior Treatment) HPI Pt states that last night he was returning from [place], [place] stopped got a cheese steak and then a few hours later began diarrhea and vomiting.  Tried OTC  Anti diarrheal medication, not helping. TNTC at this time. Also continues to vomit. Not feeling well. Did not know he had fever.  Past Medical History  Diagnosis Date  . CAD (coronary artery disease)     NSTEMI 02/2010;  s/p CABG 1/12 (L-LAD, SOM1, S-PDA); echo in 08/2010: Mild LVH, EF 52%, grade 2 diastolic dysfunction, mild MR, mild LAE, mildly reduced RV function, mild RAE, PASP 42  . Diabetes mellitus 2000    adult onset  . Gout   . ED (erectile dysfunction)   . Hyperlipidemia   . Colon polyps   . Macular degeneration   . AAA (abdominal aortic aneurysm) ([place])   . Asthma 1976  . HTN (hypertension)   . COPD (chronic obstructive pulmonary disease) ([place])   . Myocardial infarction ([place]) 1 -13-2012  . Neuropathy ([place])   . Bronchiolitis May 2016   Past Surgical History  Procedure Laterality Date  . Incision and drainage perirectal abscess    . Colonoscopy      neg 2009  . Inguinal hernia repair    . Cataract left eye    . 3 vessel cabg  03/03/2010  . Coronary artery bypass graft  2012    X3  . Colonoscopy  2014    5 mm sessile polyp; AVM  . Eye surgery     Family History  Problem Relation Age of Onset  . Heart failure Mother   . Heart disease Mother     CHF  . Heart attack Mother   . Hypertension Mother   . Heart attack Father   . Heart disease Father 45    MI  . Hypertension Father   . Heart attack Sister 26  . Heart disease Sister 37    MI  . Diabetes Sister   . Hyperlipidemia Sister   . Hypertension Sister   . Varicose Veins Sister   .  Heart attack Brother 71  . Diabetes Brother   . Heart disease Brother 52    MI  . Cancer Brother   . Hypertension Brother   . Deep vein thrombosis Daughter   . Other Son     varicose veins  . Colon cancer Neg Hx    Social History  Substance Use Topics  . Smoking status: Former Smoker    Types: Cigarettes    Quit date: 02/20/2003  . Smokeless tobacco: Never Used  . Alcohol Use: 3.6 oz/week    3 Glasses of wine, 3 Shots of liquor per week     Comment:  1-3 drinks a week    Review of Systems D&V, fever,  Allergies  Meperidine hcl and Zolpidem tartrate  Home Medications   Prior to Admission medications   Medication Sig Start Date End Date Taking? Authorizing Provider  ACCU-CHEK COMPACT STRIPS test strip TEST BLOOD SUGAR ONCE DAILY 08/18/11   Hendricks Limes, MD  albuterol (PROVENTIL HFA;VENTOLIN HFA) 108 (90 Base) MCG/ACT inhaler Inhale 2 puffs into the lungs every 6 (six) hours as needed for wheezing or shortness of breath.    Historical Provider,  MD  Aspirin (ADULT ASPIRIN LOW STRENGTH) 81 MG EC tablet Take 81 mg by mouth daily.      Historical Provider, MD  capsaicin (ZOSTRIX) 0.025 % cream Apply 1 application topically as needed.    Historical Provider, MD  capsicum oleoresin (TRIXAICIN) 0.025 % cream Apply 1 application topically as needed.    Historical Provider, MD  cetirizine (ZYRTEC) 10 MG tablet Take 10 mg by mouth daily.    Historical Provider, MD  cholecalciferol (VITAMIN D) 1000 UNITS tablet 2 ;000 units daily    Historical Provider, MD  enalapril (VASOTEC) 10 MG tablet Take 1.5 tablets (15 mg total) by mouth daily. 04/26/15   Binnie Rail, MD  Magnesium Oxide 420 MG TABS Take 420 mg by mouth daily. 420 mg by mouth daily    Historical Provider, MD  metoprolol succinate (TOPROL XL) 25 MG 24 hr tablet Take 0.5 tablets (12.5 mg total) by mouth daily. 04/29/15   Burnell Blanks, MD  Multiple Vitamins-Minerals (MULTIVITAL PO) Take by mouth daily.    Historical  Provider, MD  predniSONE (DELTASONE) 20 MG tablet Take 2 tablets (40 mg total) by mouth daily with breakfast. 04/26/15   Binnie Rail, MD  simvastatin (ZOCOR) 40 MG tablet Take 1 tablet (40 mg total) by mouth at bedtime. 04/26/15   Binnie Rail, MD  temazepam (RESTORIL) 7.5 MG capsule Take 1 capsule (7.5 mg total) by mouth at bedtime as needed. 04/26/15   Binnie Rail, MD  triamcinolone (NASACORT AQ) 55 MCG/ACT AERO nasal inhaler Place 2 sprays into the nose daily. 04/05/15   Biagio Borg, MD   Meds Ordered and Administered this Visit   Medications  acetaminophen (TYLENOL) tablet 975 mg (not administered)    BP 130/75 mmHg  Pulse 101  Temp(Src) 102.3 F (39.1 C) (Oral)  Resp 16  SpO2 90% No data found.   Physical Exam NURSES NOTES AND VITAL SIGNS REVIEWED. CONSTITUTIONAL: Well developed, well nourished, no acute distress, but appears ill HEENT: normocephalic, atraumatic, right and left TM's are normal, Mucosa a bit dry EYES: Conjunctiva normal NECK:normal ROM, supple, no adenopathy PULMONARY:No respiratory distress, normal effort, Lungs: CTAb/l, no wheezes, or increased work of breathing CARDIOVASCULAR: RRR, no murmur ABDOMEN: soft, ND, NT, +'ve BS MUSCULOSKELETAL: Normal ROM of all extremities,  SKIN: warm and dry without rash PSYCHIATRIC: Mood and affect, behavior are normal  ED Course  Procedures (including critical care time)  Labs Review Labs Reviewed - No data to display  Imaging Review No results found.   Visual Acuity Review  Right Eye Distance:   Left Eye Distance:   Bilateral Distance:    Right Eye Near:   Left Eye Near:    Bilateral Near:      Advise transfer to ER from [facility] by ambulance.  IV and antipyretic to be given.    MDM   1. Fever, unspecified fever cause   2. Diarrhea in adult patient   3. Weakness generalized    Pt to [facility] ED    Konrad Felix, [place] 05/30/15 2028

## 2015-05-30 NOTE — ED Notes (Signed)
Pt  Reports  Symptoms  Of  Fever  Body  Aches       Nausea   Vomiting  And  Diarrhea        Symptoms  Started  Yesterday  He  Reports  Shortness  Of  Breath   Weakness  And  Body  Aches  As   Well

## 2015-05-30 NOTE — ED Provider Notes (Signed)
CSN: [id number]     Arrival date & time 05/30/15  2116 History   First MD Initiated Contact with Patient 05/30/15 2133     No chief complaint on file.    (Consider location/radiation/quality/duration/timing/severity/associated sxs/prior Treatment) Patient is a 82 y.o. male presenting with diarrhea. The history is provided by the patient and the spouse.  Diarrhea Quality:  Semi-solid Severity:  Moderate Onset quality:  Sudden Duration:  1 day Timing:  Constant Progression:  Unchanged Relieved by:  Nothing Worsened by:  Nothing tried Ineffective treatments:  None tried Associated symptoms: chills, cough (starting yesterday), fever and vomiting   Risk factors: no recent antibiotic use   Risk factors comment:  Recent travel to [place], [place]   Past Medical History  Diagnosis Date  . CAD (coronary artery disease)     NSTEMI 02/2010;  s/p CABG 1/12 (L-LAD, SOM1, S-PDA); echo in 08/2010: Mild LVH, EF 35%, grade 2 diastolic dysfunction, mild MR, mild LAE, mildly reduced RV function, mild RAE, PASP 42  . Diabetes mellitus 2000    adult onset  . Gout   . ED (erectile dysfunction)   . Hyperlipidemia   . Colon polyps   . Macular degeneration   . AAA (abdominal aortic aneurysm) ([place])   . Asthma 1976  . HTN (hypertension)   . COPD (chronic obstructive pulmonary disease) ([place])   . Myocardial infarction ([place]) 1 -13-2012  . Neuropathy ([place])   . Bronchiolitis May 2016   Past Surgical History  Procedure Laterality Date  . Incision and drainage perirectal abscess    . Colonoscopy      neg 2009  . Inguinal hernia repair    . Cataract left eye    . 3 vessel cabg  03/03/2010  . Coronary artery bypass graft  2012    X3  . Colonoscopy  2014    5 mm sessile polyp; AVM  . Eye surgery     Family History  Problem Relation Age of Onset  . Heart failure Mother   . Heart disease Mother     CHF  . Heart attack Mother   . Hypertension Mother   . Heart attack Father   . Heart disease Father  47    MI  . Hypertension Father   . Heart attack Sister 9  . Heart disease Sister 61    MI  . Diabetes Sister   . Hyperlipidemia Sister   . Hypertension Sister   . Varicose Veins Sister   . Heart attack Brother 52  . Diabetes Brother   . Heart disease Brother 53    MI  . Cancer Brother   . Hypertension Brother   . Deep vein thrombosis Daughter   . Other Son     varicose veins  . Colon cancer Neg Hx    Social History  Substance Use Topics  . Smoking status: Former Smoker    Types: Cigarettes    Quit date: 02/20/2003  . Smokeless tobacco: Never Used  . Alcohol Use: 3.6 oz/week    3 Glasses of wine, 3 Shots of liquor per week     Comment:  1-3 drinks a week    Review of Systems  Constitutional: Positive for fever and chills.  Gastrointestinal: Positive for vomiting and diarrhea.  All other systems reviewed and are negative.     Allergies  Meperidine hcl and Zolpidem tartrate  Home Medications   Prior to Admission medications   Medication Sig Start Date End Date Taking? Authorizing Provider  ACCU-CHEK COMPACT STRIPS test strip TEST BLOOD SUGAR ONCE DAILY 08/18/11   Hendricks Limes, MD  albuterol (PROVENTIL HFA;VENTOLIN HFA) 108 (90 Base) MCG/ACT inhaler Inhale 2 puffs into the lungs every 6 (six) hours as needed for wheezing or shortness of breath.    Historical Provider, MD  Aspirin (ADULT ASPIRIN LOW STRENGTH) 81 MG EC tablet Take 81 mg by mouth daily.      Historical Provider, MD  capsaicin (ZOSTRIX) 0.025 % cream Apply 1 application topically as needed.    Historical Provider, MD  capsicum oleoresin (TRIXAICIN) 0.025 % cream Apply 1 application topically as needed.    Historical Provider, MD  cetirizine (ZYRTEC) 10 MG tablet Take 10 mg by mouth daily.    Historical Provider, MD  cholecalciferol (VITAMIN D) 1000 UNITS tablet 2 ;000 units daily    Historical Provider, MD  enalapril (VASOTEC) 10 MG tablet Take 1.5 tablets (15 mg total) by mouth daily. 04/26/15    Binnie Rail, MD  Magnesium Oxide 420 MG TABS Take 420 mg by mouth daily. 420 mg by mouth daily    Historical Provider, MD  metoprolol succinate (TOPROL XL) 25 MG 24 hr tablet Take 0.5 tablets (12.5 mg total) by mouth daily. 04/29/15   Burnell Blanks, MD  Multiple Vitamins-Minerals (MULTIVITAL PO) Take by mouth daily.    Historical Provider, MD  predniSONE (DELTASONE) 20 MG tablet Take 2 tablets (40 mg total) by mouth daily with breakfast. 04/26/15   Binnie Rail, MD  simvastatin (ZOCOR) 40 MG tablet Take 1 tablet (40 mg total) by mouth at bedtime. 04/26/15   Binnie Rail, MD  temazepam (RESTORIL) 7.5 MG capsule Take 1 capsule (7.5 mg total) by mouth at bedtime as needed. 04/26/15   Binnie Rail, MD  triamcinolone (NASACORT AQ) 55 MCG/ACT AERO nasal inhaler Place 2 sprays into the nose daily. 04/05/15   Biagio Borg, MD   BP 120/58 mmHg  Pulse 88  Temp(Src) 101.9 F (38.8 C) (Oral)  Resp 34  Ht '5\' 11"'$  (1.803 m)  Wt 174 lb (78.926 kg)  BMI 24.28 kg/m2  SpO2 95% Physical Exam  Constitutional: He is oriented to person, place, and time. He appears well-developed and well-nourished. He appears ill. No distress.  HENT:  Head: Normocephalic and atraumatic.  Eyes: Conjunctivae are normal.  Neck: Neck supple. No tracheal deviation present.  Cardiovascular: Normal rate, regular rhythm and normal heart sounds.   Pulmonary/Chest: Effort normal and breath sounds normal. No respiratory distress. He has no wheezes. He has no rales.  Abdominal: Soft. He exhibits no distension. There is no tenderness.  Neurological: He is alert and oriented to person, place, and time. GCS eye subscore is 4. GCS verbal subscore is 5. GCS motor subscore is 6.  Skin: Skin is warm and dry. No rash noted.  Psychiatric: He has a normal mood and affect.  Vitals reviewed.   ED Course  Procedures (including critical care time) Labs Review Labs Reviewed  COMPREHENSIVE METABOLIC PANEL - Abnormal; Notable for the  following:    CO2 20 (*)    Glucose, Bld 144 (*)    BUN 23 (*)    Creatinine, Ser 1.71 (*)    Calcium 8.4 (*)    Total Protein 6.3 (*)    Albumin 3.4 (*)    GFR calc non Af Amer 36 (*)    GFR calc Af Amer 41 (*)    All other components within normal limits  CBC WITH DIFFERENTIAL/PLATELET -  Abnormal; Notable for the following:    WBC 13.0 (*)    Neutro Abs 12.4 (*)    Lymphs Abs 0.3 (*)    All other components within normal limits  URINALYSIS, ROUTINE W REFLEX MICROSCOPIC (NOT AT [facility]) - Abnormal; Notable for the following:    Color, Urine AMBER (*)    APPearance CLOUDY (*)    Bilirubin Urine MODERATE (*)    Ketones, ur 15 (*)    Protein, ur 100 (*)    All other components within normal limits  URINE MICROSCOPIC-ADD ON - Abnormal; Notable for the following:    Squamous Epithelial / LPF 0-5 (*)    Bacteria, UA RARE (*)    Casts HYALINE CASTS (*)    All other components within normal limits  CULTURE, BLOOD (ROUTINE X 2)  CULTURE, BLOOD (ROUTINE X 2)  URINE CULTURE  INFLUENZA PANEL BY PCR (TYPE A & B, H1N1)  I-STAT CG4 LACTIC ACID, ED    Imaging Review Dg Chest 2 View  05/30/2015  CLINICAL DATA:  Fever body aches nausea and vomiting. EXAM: CHEST  2 VIEW COMPARISON:  04/26/2015 FINDINGS: There is hyperinflation lungs are clear. The pulmonary vasculature is normal. There is no pleural effusion. Hilar and mediastinal contours are unremarkable and unchanged. There is prior sternotomy and CABG. IMPRESSION: Hyperinflation.  No acute findings. Electronically Signed   By: Andreas Newport M.D.   On: 05/30/2015 22:47   Ct Abdomen Pelvis W Contrast  05/31/2015  CLINICAL DATA:  Nausea vomiting diarrhea and fever for 1 day. EXAM: CT ABDOMEN AND PELVIS WITH CONTRAST TECHNIQUE: Multidetector CT imaging of the abdomen and pelvis was performed using the standard protocol following bolus administration of intravenous contrast. CONTRAST:  64m ISOVUE-300 IOPAMIDOL (ISOVUE-300) INJECTION 61%  COMPARISON:  12/30/2012 FINDINGS: Lower chest:  No significant abnormality Hepatobiliary: There are calculi within the gallbladder lumen. No bile duct dilatation. Liver is unremarkable. Pancreas: Normal Spleen: Go Adrenals/Urinary Tract: The adrenals and kidneys are normal in appearance. There is no urinary calculus evident. There is no hydronephrosis or ureteral dilatation. Collecting systems and ureters appear unremarkable. Stomach/Bowel: The stomach is unremarkable. There is abnormal dilatation of small bowel to a mild degree, throughout its entire length of without caliber transition. There is slightly prominent mural enhancement, particularly in the ileum. This may represent enteritis. No significant mural or mesenteric edema. Vascular/Lymphatic: There is a 4.7 cm infrarenal abdominal aortic aneurysm, not significantly different from 12/30/2012. Reproductive: Unremarkable Other: No focal acute inflammatory changes are evident in the abdomen or pelvis. There is no ascites. There is no adenopathy. Musculoskeletal: No significant skeletal lesions are evident. IMPRESSION: 1. Mild abnormal small bowel dilatation without caliber transition. This may represent a mild degree of enteritis. Very low-grade partial obstruction at the terminal ileum cannot be entirely excluded. 2. Cholelithiasis 3. Unchanged 4.7 cm infrarenal abdominal aortic aneurysm. Electronically Signed   By: DAndreas NewportM.D.   On: 05/31/2015 00:28   I have personally reviewed and evaluated these images and lab results as part of my medical decision-making.   EKG Interpretation   Date/Time:  Monday May 30 2015 22:28:17 EDT Ventricular Rate:  88 PR Interval:  195 QRS Duration: 144 QT Interval:  405 QTC Calculation: 490 R Axis:   -96 Text Interpretation:  Sinus rhythm RBBB and LAFB Right bundle branch block  New since previous tracing Confirmed by Dorsel Flinn MD, Kayven Aldaco (([id number] on  05/30/2015 11:05:52 PM      MDM   Final diagnoses:  Nausea vomiting and diarrhea  SIRS (systemic inflammatory response syndrome) ([place])   82 y.o. male presents with nausea, vomiting and diarrhea today. He was seen at urgent care prior to arrival and noted to have a fever to 102. He was given Tylenol and sent over here for further evaluation. He has a history of COPD, diabetes, coronary artery disease. He had preceding cough symptoms starting yesterday. No evidence of pneumonia on chest x-ray. He is high risk for influenza given his demographics and is within the window for treatment with Tamiflu so he was tested for influenza. Fluid resuscitated pending completion of infectious workup in case of unrecognized sepsis. Elevated white blood cell count and GI symptoms with some decline in his renal function today. CT scan ordered to evaluate for occult intra-abdominal source with possible gastroenteritis. Hospitalist was consulted for admission d/t high risk demographic and SIRS and will see the patient in the emergency department.   Leo Grosser, MD 05/31/15 0100

## 2015-05-30 NOTE — ED Notes (Signed)
N/V/D all day since 0145 this am. Pt picked up [place] doctors office  102 Oral 90% RA 96% 2L Rhonchi lower lungs. Hr Afib 100 110/60 170 CBG  20 LH '975mg'$  tylenol

## 2015-05-31 ENCOUNTER — Encounter (HOSPITAL_COMMUNITY): Payer: Self-pay | Admitting: Internal Medicine

## 2015-05-31 DIAGNOSIS — E1122 Type 2 diabetes mellitus with diabetic chronic kidney disease: Secondary | ICD-10-CM

## 2015-05-31 DIAGNOSIS — R112 Nausea with vomiting, unspecified: Secondary | ICD-10-CM | POA: Diagnosis not present

## 2015-05-31 DIAGNOSIS — N1832 Chronic kidney disease, stage 3b: Secondary | ICD-10-CM | POA: Diagnosis present

## 2015-05-31 DIAGNOSIS — J439 Emphysema, unspecified: Secondary | ICD-10-CM | POA: Diagnosis not present

## 2015-05-31 DIAGNOSIS — R197 Diarrhea, unspecified: Secondary | ICD-10-CM | POA: Diagnosis present

## 2015-05-31 DIAGNOSIS — N1831 Chronic kidney disease, stage 3a: Secondary | ICD-10-CM | POA: Diagnosis present

## 2015-05-31 DIAGNOSIS — K8020 Calculus of gallbladder without cholecystitis without obstruction: Secondary | ICD-10-CM | POA: Diagnosis not present

## 2015-05-31 DIAGNOSIS — I2510 Atherosclerotic heart disease of native coronary artery without angina pectoris: Secondary | ICD-10-CM | POA: Diagnosis not present

## 2015-05-31 DIAGNOSIS — R6510 Systemic inflammatory response syndrome (SIRS) of non-infectious origin without acute organ dysfunction: Secondary | ICD-10-CM

## 2015-05-31 DIAGNOSIS — N183 Chronic kidney disease, stage 3 unspecified: Secondary | ICD-10-CM | POA: Diagnosis present

## 2015-05-31 DIAGNOSIS — N184 Chronic kidney disease, stage 4 (severe): Secondary | ICD-10-CM | POA: Diagnosis present

## 2015-05-31 DIAGNOSIS — N179 Acute kidney failure, unspecified: Secondary | ICD-10-CM

## 2015-05-31 DIAGNOSIS — N182 Chronic kidney disease, stage 2 (mild): Secondary | ICD-10-CM

## 2015-05-31 DIAGNOSIS — K529 Noninfective gastroenteritis and colitis, unspecified: Secondary | ICD-10-CM

## 2015-05-31 DIAGNOSIS — N189 Chronic kidney disease, unspecified: Secondary | ICD-10-CM

## 2015-05-31 DIAGNOSIS — I10 Essential (primary) hypertension: Secondary | ICD-10-CM

## 2015-05-31 DIAGNOSIS — J449 Chronic obstructive pulmonary disease, unspecified: Secondary | ICD-10-CM | POA: Diagnosis present

## 2015-05-31 LAB — COMPREHENSIVE METABOLIC PANEL
ALBUMIN: 3.3 g/dL — AB (ref 3.5–5.0)
ALT: 31 U/L (ref 17–63)
AST: 48 U/L — AB (ref 15–41)
Alkaline Phosphatase: 44 U/L (ref 38–126)
Anion gap: 12 (ref 5–15)
BUN: 20 mg/dL (ref 6–20)
CHLORIDE: 112 mmol/L — AB (ref 101–111)
CO2: 19 mmol/L — AB (ref 22–32)
CREATININE: 1.67 mg/dL — AB (ref 0.61–1.24)
Calcium: 7.9 mg/dL — ABNORMAL LOW (ref 8.9–10.3)
GFR calc Af Amer: 43 mL/min — ABNORMAL LOW (ref >60)
GFR calc non Af Amer: 37 mL/min — ABNORMAL LOW (ref >60)
Glucose, Bld: 155 mg/dL — ABNORMAL HIGH (ref 65–99)
POTASSIUM: 4.1 mmol/L (ref 3.5–5.1)
SODIUM: 143 mmol/L (ref 135–145)
Total Bilirubin: 0.7 mg/dL (ref 0.3–1.2)
Total Protein: 6.4 g/dL — ABNORMAL LOW (ref 6.5–8.1)

## 2015-05-31 LAB — URINALYSIS, ROUTINE W REFLEX MICROSCOPIC
GLUCOSE, UA: NEGATIVE mg/dL
HGB URINE DIPSTICK: NEGATIVE
Ketones, ur: 15 mg/dL — AB
Leukocytes, UA: NEGATIVE
Nitrite: NEGATIVE
Protein, ur: 100 mg/dL — AB
Specific Gravity, Urine: 1.029 (ref 1.005–1.030)
pH: 5 (ref 5.0–8.0)

## 2015-05-31 LAB — GLUCOSE, CAPILLARY
GLUCOSE-CAPILLARY: 74 mg/dL (ref 65–99)
GLUCOSE-CAPILLARY: 78 mg/dL (ref 65–99)
GLUCOSE-CAPILLARY: 84 mg/dL (ref 65–99)
Glucose-Capillary: 136 mg/dL — ABNORMAL HIGH (ref 65–99)
Glucose-Capillary: 123 mg/dL — ABNORMAL HIGH (ref 65–99)

## 2015-05-31 LAB — URINE MICROSCOPIC-ADD ON

## 2015-05-31 LAB — CBC
HEMATOCRIT: 39.8 % (ref 39.0–52.0)
Hemoglobin: 12.6 g/dL — ABNORMAL LOW (ref 13.0–17.0)
MCH: 29.7 pg (ref 26.0–34.0)
MCHC: 31.7 g/dL (ref 30.0–36.0)
MCV: 93.9 fL (ref 78.0–100.0)
Platelets: 178 10*3/uL (ref 150–400)
RBC: 4.24 MIL/uL (ref 4.22–5.81)
RDW: 13.1 % (ref 11.5–15.5)
WBC: 15.6 10*3/uL — AB (ref 4.0–10.5)

## 2015-05-31 LAB — TROPONIN I
TROPONIN I: 0.03 ng/mL (ref <0.031)
Troponin I: <0.03 ng/mL (ref <0.031)
Troponin I: 0.03 ng/mL (ref <0.031)

## 2015-05-31 LAB — MAGNESIUM: Magnesium: 1.4 mg/dL — ABNORMAL LOW (ref 1.7–2.4)

## 2015-05-31 LAB — INFLUENZA PANEL BY PCR (TYPE A & B)
H1N1 flu by pcr: NOT DETECTED
INFLAPCR: NEGATIVE
Influenza B By PCR: NEGATIVE

## 2015-05-31 LAB — TSH: TSH: 0.753 u[IU]/mL (ref 0.350–4.500)

## 2015-05-31 LAB — PHOSPHORUS: PHOSPHORUS: 3.4 mg/dL (ref 2.5–4.6)

## 2015-05-31 MED ORDER — SODIUM CHLORIDE 0.9% FLUSH
3.0000 mL | Freq: Two times a day (BID) | INTRAVENOUS | Status: DC
  Administered 2015-05-31 – 2015-06-02 (×4): 3 mL via INTRAVENOUS

## 2015-05-31 MED ORDER — IPRATROPIUM BROMIDE 0.02 % IN SOLN
0.5000 mg | Freq: Four times a day (QID) | RESPIRATORY_TRACT | Status: DC
  Administered 2015-05-31 – 2015-06-01 (×6): 0.5 mg via RESPIRATORY_TRACT
  Filled 2015-05-31 (×6): qty 2.5

## 2015-05-31 MED ORDER — ONDANSETRON HCL 4 MG/2ML IJ SOLN
4.0000 mg | Freq: Four times a day (QID) | INTRAMUSCULAR | Status: DC | PRN
  Administered 2015-06-01: 4 mg via INTRAVENOUS
  Filled 2015-05-31: qty 2

## 2015-05-31 MED ORDER — HYDROCODONE-ACETAMINOPHEN 5-325 MG PO TABS: 1.0000 | ORAL_TABLET | ORAL | Status: DC | PRN

## 2015-05-31 MED ORDER — LORATADINE 10 MG PO TABS
10.0000 mg | ORAL_TABLET | Freq: Every day | ORAL | Status: DC
  Administered 2015-05-31 – 2015-06-02 (×3): 10 mg via ORAL
  Filled 2015-05-31 (×3): qty 1

## 2015-05-31 MED ORDER — TEMAZEPAM 7.5 MG PO CAPS
7.5000 mg | ORAL_CAPSULE | Freq: Every evening | ORAL | Status: DC | PRN
  Administered 2015-05-31: 7.5 mg via ORAL
  Filled 2015-05-31 (×2): qty 1

## 2015-05-31 MED ORDER — METOPROLOL SUCCINATE ER 25 MG PO TB24
12.5000 mg | ORAL_TABLET | Freq: Every day | ORAL | Status: DC
  Administered 2015-05-31 – 2015-06-02 (×3): 12.5 mg via ORAL
  Filled 2015-05-31 (×3): qty 1

## 2015-05-31 MED ORDER — ONDANSETRON HCL 4 MG PO TABS: 4.0000 mg | ORAL_TABLET | Freq: Four times a day (QID) | ORAL | Status: DC | PRN

## 2015-05-31 MED ORDER — CETYLPYRIDINIUM CHLORIDE 0.05 % MT LIQD
7.0000 mL | Freq: Two times a day (BID) | OROMUCOSAL | Status: DC
  Administered 2015-05-31 – 2015-06-01 (×4): 7 mL via OROMUCOSAL

## 2015-05-31 MED ORDER — MAGNESIUM SULFATE 4 GM/100ML IV SOLN
4.0000 g | Freq: Once | INTRAVENOUS | Status: AC
  Administered 2015-05-31: 4 g via INTRAVENOUS
  Filled 2015-05-31: qty 100

## 2015-05-31 MED ORDER — SODIUM CHLORIDE 0.9 % IV SOLN
INTRAVENOUS | Status: AC
  Administered 2015-05-31: 02:00:00 via INTRAVENOUS

## 2015-05-31 MED ORDER — ASPIRIN 81 MG PO CHEW
81.0000 mg | CHEWABLE_TABLET | Freq: Every day | ORAL | Status: DC
  Administered 2015-05-31 – 2015-06-02 (×3): 81 mg via ORAL
  Filled 2015-05-31 (×3): qty 1

## 2015-05-31 MED ORDER — SIMVASTATIN 40 MG PO TABS
40.0000 mg | ORAL_TABLET | Freq: Every day | ORAL | Status: DC
  Administered 2015-05-31: 40 mg via ORAL
  Filled 2015-05-31: qty 1

## 2015-05-31 MED ORDER — INSULIN ASPART 100 UNIT/ML ~~LOC~~ SOLN: 0.0000 [IU] | Freq: Every day | SUBCUTANEOUS | Status: DC

## 2015-05-31 MED ORDER — INSULIN ASPART 100 UNIT/ML ~~LOC~~ SOLN
0.0000 [IU] | Freq: Three times a day (TID) | SUBCUTANEOUS | Status: DC
  Administered 2015-05-31: 1 [IU] via SUBCUTANEOUS

## 2015-05-31 MED ORDER — ACETAMINOPHEN 325 MG PO TABS: 650.0000 mg | ORAL_TABLET | Freq: Four times a day (QID) | ORAL | Status: DC | PRN

## 2015-05-31 MED ORDER — GUAIFENESIN ER 600 MG PO TB12
600.0000 mg | ORAL_TABLET | Freq: Two times a day (BID) | ORAL | Status: DC
  Administered 2015-05-31 – 2015-06-02 (×5): 600 mg via ORAL
  Filled 2015-05-31 (×5): qty 1

## 2015-05-31 MED ORDER — ACETAMINOPHEN 650 MG RE SUPP: 650.0000 mg | Freq: Four times a day (QID) | RECTAL | Status: DC | PRN

## 2015-05-31 MED ORDER — ENOXAPARIN SODIUM 30 MG/0.3ML ~~LOC~~ SOLN: 30.0000 mg | SUBCUTANEOUS | Status: DC

## 2015-05-31 MED ORDER — ENOXAPARIN SODIUM 40 MG/0.4ML ~~LOC~~ SOLN
40.0000 mg | SUBCUTANEOUS | Status: DC
  Administered 2015-05-31 – 2015-06-02 (×3): 40 mg via SUBCUTANEOUS
  Filled 2015-05-31 (×3): qty 0.4

## 2015-05-31 MED ORDER — SODIUM CHLORIDE 0.9 % IV SOLN
INTRAVENOUS | Status: DC
  Administered 2015-05-31: 21:00:00 via INTRAVENOUS

## 2015-05-31 MED ORDER — ALBUTEROL SULFATE (2.5 MG/3ML) 0.083% IN NEBU
2.5000 mg | INHALATION_SOLUTION | RESPIRATORY_TRACT | Status: DC | PRN
  Administered 2015-05-31: 2.5 mg via RESPIRATORY_TRACT
  Filled 2015-05-31: qty 3

## 2015-05-31 NOTE — Progress Notes (Signed)
Received report.  Room ready.   

## 2015-05-31 NOTE — Care Management Obs Status (Signed)
[facility] NOTIFICATION   Patient Details  Name: KADIR AZUCENA MRN: [id number] Date of Birth: 1931/06/06   Medicare Observation Status Notification Given:  Yes    Aviyon Hocevar, Rory Percy, RN 05/31/2015, 11:45 AM

## 2015-05-31 NOTE — H&P (Signed)
PCP: Binnie Rail, MD  [facility]  Referring provider Laneta Simmers   Chief Complaint:  Nausea/vomitting diarrhea/fever, SOB, bodyaches  HPI: Brian [name] is a 82 y.o. male   has a past medical history of CAD (coronary artery disease); Diabetes mellitus (2000); Gout; ED (erectile dysfunction); Hyperlipidemia; Colon polyps; Macular degeneration; AAA (abdominal aortic aneurysm) ([place]); Asthma (1976); HTN (hypertension); COPD (chronic obstructive pulmonary disease) ([place]); Myocardial infarction ([place]) (1 -13-2012); Neuropathy ([place]); and Bronchiolitis (May 2016).   Presented with < 24 hours of acute onset of diarrhea, fever, nausea vomiting body aches after eating a cheese steak at a place while traveling from [place] there  he was visiting for a funeral. Patietn presented to urgent care and was sent to ER by EMS.He could not keep anything down may have had 40-50 BM's  IN ER: influenza swabed, CXR showng hyperinflaton but  no  infiltrate   Regarding pertinent past history: Diabetes not on insulin diet controlled, has COPD/asthma he has been on Albuteral inhaler as need.   Hospitalist was called for admission for  SIRS  Review of Systems:    Pertinent positives include:  abdominal pain, nausea, vomiting, diarrhea, Fevers, chills, fatigue, shortness of breath at rest.  dyspnea on exertion  Constitutional:  No weight loss, night sweats, weight loss  HEENT:  No headaches, Difficulty swallowing,Tooth/dental problems,Sore throat,  No sneezing, itching, ear ache, nasal congestion, post nasal drip,  Cardio-vascular:  No chest pain, Orthopnea, PND, anasarca, dizziness, palpitations.no Bilateral lower extremity swelling  GI:  No heartburn, indigestion, change in bowel habits, loss of appetite, melena, blood in stool, hematemesis Resp:  no  No, No excess mucus, no productive cough, No non-productive cough, No coughing up of blood.No change in color of mucus.No  wheezing. Skin:  no rash or lesions. No jaundice GU:  no dysuria, change in color of urine, no urgency or frequency. No straining to urinate.  No flank pain.  Musculoskeletal:  No joint pain or no joint swelling. No decreased range of motion. No back pain.  Psych:  No change in mood or affect. No depression or anxiety. No memory loss.  Neuro: no localizing neurological complaints, no tingling, no weakness, no double vision, no gait abnormality, no slurred speech, no confusion  Otherwise ROS are negative except for above, 10 systems were reviewed  Past Medical History: Past Medical History  Diagnosis Date  . CAD (coronary artery disease)     NSTEMI 02/2010;  s/p CABG 1/12 (L-LAD, SOM1, S-PDA); echo in 08/2010: Mild LVH, EF 11%, grade 2 diastolic dysfunction, mild MR, mild LAE, mildly reduced RV function, mild RAE, PASP 42  . Diabetes mellitus 2000    adult onset  . Gout   . ED (erectile dysfunction)   . Hyperlipidemia   . Colon polyps   . Macular degeneration   . AAA (abdominal aortic aneurysm) ([place])   . Asthma 1976  . HTN (hypertension)   . COPD (chronic obstructive pulmonary disease) ([place])   . Myocardial infarction ([place]) 1 -13-2012  . Neuropathy ([place])   . Bronchiolitis May 2016   Past Surgical History  Procedure Laterality Date  . Incision and drainage perirectal abscess    . Colonoscopy      neg 2009  . Inguinal hernia repair    . Cataract left eye    . 3 vessel cabg  03/03/2010  . Coronary artery bypass graft  2012    X3  . Colonoscopy  2014  5 mm sessile polyp; AVM  . Eye surgery       Medications: Prior to Admission medications   Medication Sig Start Date End Date Taking? Authorizing Provider  albuterol (PROVENTIL HFA;VENTOLIN HFA) 108 (90 Base) MCG/ACT inhaler Inhale 2 puffs into the lungs every 6 (six) hours as needed for wheezing or shortness of breath.   Yes Historical Provider, MD  Aspirin (ADULT ASPIRIN LOW STRENGTH) 81 MG EC tablet Take 81 mg by mouth  daily.     Yes Historical Provider, MD  capsaicin (ZOSTRIX) 0.025 % cream Apply 1 application topically as needed (for irritation).    Yes Historical Provider, MD  cetirizine (ZYRTEC) 10 MG tablet Take 10 mg by mouth daily.   Yes Historical Provider, MD  cholecalciferol (VITAMIN D) 1000 UNITS tablet Take 2,000 Units by mouth daily.    Yes Historical Provider, MD  enalapril (VASOTEC) 10 MG tablet Take 1.5 tablets (15 mg total) by mouth daily. 04/26/15  Yes Binnie Rail, MD  loperamide (IMODIUM A-D) 2 MG tablet Take 2 mg by mouth 4 (four) times daily as needed for diarrhea or loose stools.   Yes Historical Provider, MD  Magnesium Oxide 420 MG TABS Take 420 mg by mouth daily.    Yes Historical Provider, MD  metoprolol succinate (TOPROL XL) 25 MG 24 hr tablet Take 0.5 tablets (12.5 mg total) by mouth daily. 04/29/15  Yes Burnell Blanks, MD  Multiple Vitamins-Minerals (MULTIVITAL PO) Take 1 tablet by mouth daily.    Yes Historical Provider, MD  simvastatin (ZOCOR) 40 MG tablet Take 1 tablet (40 mg total) by mouth at bedtime. 04/26/15  Yes Binnie Rail, MD  temazepam (RESTORIL) 7.5 MG capsule Take 1 capsule (7.5 mg total) by mouth at bedtime as needed. 04/26/15  Yes Binnie Rail, MD  triamcinolone (NASACORT AQ) 55 MCG/ACT AERO nasal inhaler Place 2 sprays into the nose daily. 04/05/15  Yes Biagio Borg, MD  ACCU-CHEK COMPACT STRIPS test strip TEST BLOOD SUGAR ONCE DAILY 08/18/11   Hendricks Limes, MD  predniSONE (DELTASONE) 20 MG tablet Take 2 tablets (40 mg total) by mouth daily with breakfast. 04/26/15   Binnie Rail, MD    Allergies:   Allergies  Allergen Reactions  . Meperidine Hcl     Tongue swelling Because of a history of documented adverse serious drug reaction;Medi Alert bracelet  is recommended  . Zolpidem Tartrate     REACTION: Crazy dreams    Social History:  Ambulatory   independently and still drives Lives at home alone family lives close by     reports that he quit smoking  about 12 years ago. His smoking use included Cigarettes. He has never used smokeless tobacco. He reports that he drinks about 3.6 oz of alcohol per week. He reports that he does not use illicit drugs.     Family History: family history includes Cancer in his brother; Deep vein thrombosis in his daughter; Diabetes in his brother and sister; Heart attack in his father and mother; Heart attack (age of onset: 98) in his sister; Heart attack (age of onset: 25) in his brother; Heart disease in his mother; Heart disease (age of onset: 55) in his sister; Heart disease (age of onset: 48) in his brother; Heart disease (age of onset: 70) in his father; Heart failure in his mother; Hyperlipidemia in his sister; Hypertension in his brother, father, mother, and sister; Other in his son; Varicose Veins in his sister. There is no  history of Colon cancer.    Physical Exam: Patient Vitals for the past 24 hrs:  BP Temp Temp src Pulse Resp SpO2 Height Weight  05/30/15 2345 111/62 mmHg - - 87 - 92 % - -  05/30/15 2330 127/59 mmHg - - 87 (!) 30 90 % - -  05/30/15 2315 125/63 mmHg - - 87 - 93 % - -  05/30/15 2300 126/56 mmHg - - 84 (!) 29 94 % - -  05/30/15 2141 120/58 mmHg 101.9 F (38.8 C) Oral 88 (!) 34 95 % - -  05/30/15 2132 - - - - - - '5\' 11"'$  (1.803 m) 78.926 kg (174 lb)  05/30/15 2127 - - - - - 96 % - -    1. General:  in No Acute distress 2. Psychological: Alert and   Oriented 3. Head/ENT:     Dry Mucous Membranes                          Head Non traumatic, neck supple                          Normal  Dentition 4. SKIN:   decreased Skin turgor,  Skin clean Dry and intact no rash 5. Heart: Regular rate and rhythm no  Murmur, Rub or gallop 6. Lungs:   no wheezes or crackles   7. Abdomen: Soft, non-tender, Non distended 8. Lower extremities: no clubbing, cyanosis, or edema 9. Neurologically Grossly intact, moving all 4 extremities equally 10. MSK: Normal range of motion  body mass index is 24.28  kg/(m^2).   Labs on Admission:   Results for orders placed or performed during the hospital encounter of 05/30/15 (from the past 24 hour(s))  Comprehensive metabolic panel     Status: Abnormal   Collection Time: 05/30/15 10:17 PM  Result Value Ref Range   Sodium 141 135 - 145 mmol/L   Potassium 3.6 3.5 - 5.1 mmol/L   Chloride 110 101 - 111 mmol/L   CO2 20 (L) 22 - 32 mmol/L   Glucose, Bld 144 (H) 65 - 99 mg/dL   BUN 23 (H) 6 - 20 mg/dL   Creatinine, Ser 1.71 (H) 0.61 - 1.24 mg/dL   Calcium 8.4 (L) 8.9 - 10.3 mg/dL   Total Protein 6.3 (L) 6.5 - 8.1 g/dL   Albumin 3.4 (L) 3.5 - 5.0 g/dL   AST 28 15 - 41 U/L   ALT 19 17 - 63 U/L   Alkaline Phosphatase 49 38 - 126 U/L   Total Bilirubin 0.8 0.3 - 1.2 mg/dL   GFR calc non Af Amer 36 (L) >60 mL/min   GFR calc Af Amer 41 (L) >60 mL/min   Anion gap 11 5 - 15  CBC WITH DIFFERENTIAL     Status: Abnormal   Collection Time: 05/30/15 10:17 PM  Result Value Ref Range   WBC 13.0 (H) 4.0 - 10.5 K/uL   RBC 4.37 4.22 - 5.81 MIL/uL   Hemoglobin 13.5 13.0 - 17.0 g/dL   HCT 40.5 39.0 - 52.0 %   MCV 92.7 78.0 - 100.0 fL   MCH 30.9 26.0 - 34.0 pg   MCHC 33.3 30.0 - 36.0 g/dL   RDW 13.0 11.5 - 15.5 %   Platelets 185 150 - 400 K/uL   Neutrophils Relative % 95 %   Neutro Abs 12.4 (H) 1.7 - 7.7 K/uL   Lymphocytes Relative 3 %  Lymphs Abs 0.3 (L) 0.7 - 4.0 K/uL   Monocytes Relative 2 %   Monocytes Absolute 0.3 0.1 - 1.0 K/uL   Eosinophils Relative 0 %   Eosinophils Absolute 0.0 0.0 - 0.7 K/uL   Basophils Relative 0 %   Basophils Absolute 0.0 0.0 - 0.1 K/uL  I-Stat CG4 Lactic Acid, ED  (not at  [facility])     Status: None   Collection Time: 05/30/15 10:29 PM  Result Value Ref Range   Lactic Acid, Venous 1.09 0.5 - 2.0 mmol/L    UA ordered   Lab Results  Component Value Date   HGBA1C 6.4 03/11/2015    Estimated Creatinine Clearance: 36.1 mL/min (by C-G formula based on Cr of 1.71).  BNP (last 3 results) No results for input(s): PROBNP in  the last 8760 hours.  Other results:  I have pearsonaly reviewed this: ECG REPORT  Rate:88  Rhythm: RBBB ST&T Change: no acute ischemia QTC 490  Filed Weights   05/30/15 2132  Weight: 78.926 kg (174 lb)     Cultures: No results found for: SDES, SPECREQUEST, CULT, REPTSTATUS   Radiological Exams on Admission: Dg Chest 2 View  05/30/2015  CLINICAL DATA:  Fever body aches nausea and vomiting. EXAM: CHEST  2 VIEW COMPARISON:  04/26/2015 FINDINGS: There is hyperinflation lungs are clear. The pulmonary vasculature is normal. There is no pleural effusion. Hilar and mediastinal contours are unremarkable and unchanged. There is prior sternotomy and CABG. IMPRESSION: Hyperinflation.  No acute findings. Electronically Signed   By: Andreas Newport M.D.   On: 05/30/2015 22:47    Chart has been reviewed  Family   at  Bedside  plan of care was discussed  Son Shontez Sermon [phone]  Assessment/Plan  82 yo M w hx of HTN, COPD, diet controlled DM here with likely gastroenteritis.   Present on Admission:  . SIRS (systemic inflammatory response syndrome) (HCC) - we'll slightly secondary to gastroenteritis. Lactic acid within normal limits. Vital signs improved with IV fluid resuscitation. For now obtain blood cultures but hold off on antibiotics as a source most likely viral. Await results of blood cultures stool PCR and influenza panel  . CAD (coronary artery disease) stable continue aspirin and statin and beta blocker  . COPD (chronic obstructive pulmonary disease) (HCC) albuterol when necessary scheduled Atrovent and Mucinex as needed benefit from an ambulatory pulse ox prior to discharge  . Hypertension stable continue home medications  . Diarrhea was likely secondary to gastroenteritis given febrile illness. We will rehydrate obtain stool PCR  . Acute on chronic renal failure (HCC) secondary to dehydration will hold ACE inhibitor  . Gastroenteritis supportive management Dyspnea  given history of coronary disease post cycle cardiac enzymes repeat serial EKG  Prophylaxis:  Lovenox   CODE STATUS:  FULL CODE  as per patient    Disposition:  To home once workup is complete and patient is stable  Other plan as per orders.  I have spent a total of 56 min on this admission    Alfie Rideaux 05/31/2015, 1:14 AM    [facility]  Pager [phone]   after 2 AM please page floor coverage PA If 7AM-7PM, please contact the day team taking care of the patient  [email or web address]  Password [email or web address]

## 2015-05-31 NOTE — Progress Notes (Signed)
[facility] PROGRESS NOTE  ANASTACIO BUA JOA:[id number] DOB: [birth year] DOA: 05/30/2015 PCP: Binnie Rail, MD  Brief Summary  Brian [name] is a 82 y.o. male with history of CAD (coronary artery disease); Diabetes mellitus (2000); Gout; ED (erectile dysfunction); Hyperlipidemia; Colon polyps; Macular degeneration; AAA (abdominal aortic aneurysm) ([place]); Asthma (1976); HTN (hypertension); COPD (chronic obstructive pulmonary disease) ([place]); Myocardial infarction ([place]) (1 -13-2012); Neuropathy ([place]); and Bronchiolitis (May 2016).   Presented with < 24 hours of acute onset of diarrhea, fever, nausea vomiting body aches after eating a cheese steak.  Patient presented to urgent care and was sent to ER by EMS.  He could not keep anything down may have had 40-50 BM's  Assessment/Plan  SIRS with fever, tachycardia, tachypnea in association with gastroenteritis.  Still having copious stools resulting in hypomagnesemia and nongap metabolic acidosis -  Add on GI pathogen panel -  Continue IVF -  Hold on antibiotics since likely viral infection -  IV magnesium supplementation -  If patient develops hypotension, worsening tachycardia, or other signs of worsening infection, would start antibiotics -  BCx NGTD  CAD, stable, chest pain free, continue aspirin, statin, and BB  COPD, stable, continue atrovent  Hypertension, blood pressures are low normal.   -  Continue low dose metoprolol -  Hold ACEI  Generalized weakness due to acute illness -  PT evaluation  AAA, 4.7 cm infrarenal AAA seen on CT -  F/u with vascular surgery  Diet:  Advance to regular Access:  PIV IVF:  yes Proph:  lovenox  Code Status: full Family Communication: patient and son Disposition Plan: pending tolerating regular diet and able to stay hydrated.  Diarrhea slowing down.    Consultants:  None  Procedures:  CT ab/p  Antibiotics:  none   HPI/Subjective:  Still having copious watery diarrhea  without blood or melena.  Denies nausea and feels hungry.  Denies abdominal pain.      Objective: Filed Vitals:   05/31/15 0804 05/31/15 0858 05/31/15 1013 05/31/15 1415  BP: 113/48  117/56   Pulse: 63     Temp: 98.4 F (36.9 C)     TempSrc: Oral     Resp: 19     Height:      Weight:      SpO2: 95% 95%  94%    Intake/Output Summary (Last 24 hours) at 05/31/15 1512 Last data filed at 05/31/15 1026  Gross per 24 hour  Intake   4120 ml  Output    300 ml  Net   3820 ml   Filed Weights   05/30/15 2132 05/31/15 0220  Weight: 78.926 kg (174 lb) 78.8 kg (173 lb 11.6 oz)   Body mass index is 24.24 kg/(m^2).  Exam:   General:  Adult male, No acute distress  HEENT:  NCAT, MMM  Cardiovascular:  RRR, nl S1, S2 no mrg, 2+ pulses, warm extremities  Respiratory:  CTAB, no increased WOB  Abdomen:   Hyperactive BS, soft, NT/ND  MSK:   Normal tone and bulk, no LEE  Neuro:  Grossly intact  Data Reviewed: Basic Metabolic Panel:  Recent Labs Lab 05/30/15 2217 05/31/15 0225  NA 141 143  K 3.6 4.1  CL 110 112*  CO2 20* 19*  GLUCOSE 144* 155*  BUN 23* 20  CREATININE 1.71* 1.67*  CALCIUM 8.4* 7.9*  MG  --  1.4*  PHOS  --  3.4   Liver Function Tests:  Recent Labs Lab 05/30/15 2217 05/31/15  0225  AST 28 48*  ALT 19 31  ALKPHOS 49 44  BILITOT 0.8 0.7  PROT 6.3* 6.4*  ALBUMIN 3.4* 3.3*   No results for input(s): LIPASE, AMYLASE in the last 168 hours. No results for input(s): AMMONIA in the last 168 hours. CBC:  Recent Labs Lab 05/30/15 2217 05/31/15 0225  WBC 13.0* 15.6*  NEUTROABS 12.4*  --   HGB 13.5 12.6*  HCT 40.5 39.8  MCV 92.7 93.9  PLT 185 178    Recent Results (from the past 240 hour(s))  Blood Culture (routine x 2)     Status: None (Preliminary result)   Collection Time: 05/30/15 10:17 PM  Result Value Ref Range Status   Specimen Description BLOOD RIGHT FOREARM  Final   Special Requests BOTTLES DRAWN AEROBIC ONLY 10CC  Final   Culture NO  GROWTH < 24 HOURS  Final   Report Status PENDING  Incomplete  Blood Culture (routine x 2)     Status: None (Preliminary result)   Collection Time: 05/30/15 10:25 PM  Result Value Ref Range Status   Specimen Description BLOOD RIGHT HAND  Final   Special Requests BOTTLES DRAWN AEROBIC ONLY 5CC  Final   Culture NO GROWTH < 24 HOURS  Final   Report Status PENDING  Incomplete     Studies: Dg Chest 2 View  05/30/2015  CLINICAL DATA:  Fever body aches nausea and vomiting. EXAM: CHEST  2 VIEW COMPARISON:  04/26/2015 FINDINGS: There is hyperinflation lungs are clear. The pulmonary vasculature is normal. There is no pleural effusion. Hilar and mediastinal contours are unremarkable and unchanged. There is prior sternotomy and CABG. IMPRESSION: Hyperinflation.  No acute findings. Electronically Signed   By: Andreas Newport M.D.   On: 05/30/2015 22:47   Ct Abdomen Pelvis W Contrast  05/31/2015  CLINICAL DATA:  Nausea vomiting diarrhea and fever for 1 day. EXAM: CT ABDOMEN AND PELVIS WITH CONTRAST TECHNIQUE: Multidetector CT imaging of the abdomen and pelvis was performed using the standard protocol following bolus administration of intravenous contrast. CONTRAST:  15m ISOVUE-300 IOPAMIDOL (ISOVUE-300) INJECTION 61% COMPARISON:  12/30/2012 FINDINGS: Lower chest:  No significant abnormality Hepatobiliary: There are calculi within the gallbladder lumen. No bile duct dilatation. Liver is unremarkable. Pancreas: Normal Spleen: Go Adrenals/Urinary Tract: The adrenals and kidneys are normal in appearance. There is no urinary calculus evident. There is no hydronephrosis or ureteral dilatation. Collecting systems and ureters appear unremarkable. Stomach/Bowel: The stomach is unremarkable. There is abnormal dilatation of small bowel to a mild degree, throughout its entire length of without caliber transition. There is slightly prominent mural enhancement, particularly in the ileum. This may represent enteritis. No  significant mural or mesenteric edema. Vascular/Lymphatic: There is a 4.7 cm infrarenal abdominal aortic aneurysm, not significantly different from 12/30/2012. Reproductive: Unremarkable Other: No focal acute inflammatory changes are evident in the abdomen or pelvis. There is no ascites. There is no adenopathy. Musculoskeletal: No significant skeletal lesions are evident. IMPRESSION: 1. Mild abnormal small bowel dilatation without caliber transition. This may represent a mild degree of enteritis. Very low-grade partial obstruction at the terminal ileum cannot be entirely excluded. 2. Cholelithiasis 3. Unchanged 4.7 cm infrarenal abdominal aortic aneurysm. Electronically Signed   By: DAndreas NewportM.D.   On: 05/31/2015 00:28    Scheduled Meds: . antiseptic oral rinse  7 mL Mouth Rinse BID  . aspirin  81 mg Oral Daily  . enoxaparin (LOVENOX) injection  40 mg Subcutaneous Q24H  . guaiFENesin  600 mg  Oral BID  . insulin aspart  0-5 Units Subcutaneous QHS  . insulin aspart  0-9 Units Subcutaneous TID WC  . ipratropium  0.5 mg Nebulization Q6H  . loratadine  10 mg Oral Daily  . metoprolol succinate  12.5 mg Oral Daily  . simvastatin  40 mg Oral QHS  . sodium chloride flush  3 mL Intravenous Q12H   Continuous Infusions:   Active Problems:   CAD (coronary artery disease)   Hypertension   Diabetes ([place])   SIRS (systemic inflammatory response syndrome) (HCC)   COPD (chronic obstructive pulmonary disease) (HCC)   Diarrhea   CKD (chronic kidney disease) stage 3, GFR 30-59 ml/min   Acute on chronic renal failure (HCC)   Gastroenteritis    Time spent: 30 min    Tiyana Galla, [facility] Pager [phone]. If 7PM-7AM, please contact night-coverage at [email or web address], password [facility] 05/31/2015, 3:12 PM

## 2015-06-01 ENCOUNTER — Observation Stay (HOSPITAL_COMMUNITY): Payer: Medicare Other

## 2015-06-01 DIAGNOSIS — N183 Chronic kidney disease, stage 3 (moderate): Secondary | ICD-10-CM

## 2015-06-01 DIAGNOSIS — J9811 Atelectasis: Secondary | ICD-10-CM | POA: Diagnosis not present

## 2015-06-01 DIAGNOSIS — J439 Emphysema, unspecified: Secondary | ICD-10-CM | POA: Diagnosis not present

## 2015-06-01 DIAGNOSIS — I2510 Atherosclerotic heart disease of native coronary artery without angina pectoris: Secondary | ICD-10-CM | POA: Diagnosis not present

## 2015-06-01 DIAGNOSIS — R112 Nausea with vomiting, unspecified: Secondary | ICD-10-CM | POA: Diagnosis not present

## 2015-06-01 DIAGNOSIS — N179 Acute kidney failure, unspecified: Secondary | ICD-10-CM | POA: Diagnosis not present

## 2015-06-01 DIAGNOSIS — R6510 Systemic inflammatory response syndrome (SIRS) of non-infectious origin without acute organ dysfunction: Secondary | ICD-10-CM | POA: Diagnosis not present

## 2015-06-01 DIAGNOSIS — R197 Diarrhea, unspecified: Secondary | ICD-10-CM | POA: Diagnosis not present

## 2015-06-01 DIAGNOSIS — R05 Cough: Secondary | ICD-10-CM | POA: Diagnosis not present

## 2015-06-01 LAB — GASTROINTESTINAL PANEL BY PCR, STOOL (REPLACES STOOL CULTURE)
ADENOVIRUS F40/41: NOT DETECTED
Astrovirus: NOT DETECTED
CRYPTOSPORIDIUM: NOT DETECTED
CYCLOSPORA CAYETANENSIS: NOT DETECTED
Campylobacter species: NOT DETECTED
E. coli O157: NOT DETECTED
ENTAMOEBA HISTOLYTICA: NOT DETECTED
ENTEROAGGREGATIVE E COLI (EAEC): NOT DETECTED
Enteropathogenic E coli (EPEC): NOT DETECTED
Enterotoxigenic E coli (ETEC): NOT DETECTED
GIARDIA LAMBLIA: NOT DETECTED
Norovirus GI/GII: DETECTED — AB
PLESIMONAS SHIGELLOIDES: NOT DETECTED
Rotavirus A: NOT DETECTED
SALMONELLA SPECIES: NOT DETECTED
SHIGELLA/ENTEROINVASIVE E COLI (EIEC): NOT DETECTED
Sapovirus (I, II, IV, and V): NOT DETECTED
Shiga like toxin producing E coli (STEC): NOT DETECTED
VIBRIO CHOLERAE: NOT DETECTED
VIBRIO SPECIES: NOT DETECTED
YERSINIA ENTEROCOLITICA: NOT DETECTED

## 2015-06-01 LAB — NOROVIRUS GROUP 1 & 2 BY PCR, STOOL
Norovirus 1 by PCR: NEGATIVE
Norovirus 2  by PCR: POSITIVE — AB

## 2015-06-01 LAB — CBC
HCT: 35.1 % — ABNORMAL LOW (ref 39.0–52.0)
HEMOGLOBIN: 11.4 g/dL — AB (ref 13.0–17.0)
MCH: 31.1 pg (ref 26.0–34.0)
MCHC: 32.5 g/dL (ref 30.0–36.0)
MCV: 95.6 fL (ref 78.0–100.0)
PLATELETS: 137 10*3/uL — AB (ref 150–400)
RBC: 3.67 MIL/uL — AB (ref 4.22–5.81)
RDW: 13.6 % (ref 11.5–15.5)
WBC: 7.9 10*3/uL (ref 4.0–10.5)

## 2015-06-01 LAB — GLUCOSE, CAPILLARY
GLUCOSE-CAPILLARY: 88 mg/dL (ref 65–99)
Glucose-Capillary: 87 mg/dL (ref 65–99)
Glucose-Capillary: 89 mg/dL (ref 65–99)
Glucose-Capillary: 88 mg/dL (ref 65–99)

## 2015-06-01 LAB — COMPREHENSIVE METABOLIC PANEL
ALK PHOS: 57 U/L (ref 38–126)
ALT: 108 U/L — AB (ref 17–63)
AST: 89 U/L — AB (ref 15–41)
Albumin: 2.7 g/dL — ABNORMAL LOW (ref 3.5–5.0)
Anion gap: 7 (ref 5–15)
BUN: 19 mg/dL (ref 6–20)
CALCIUM: 7.6 mg/dL — AB (ref 8.9–10.3)
CHLORIDE: 114 mmol/L — AB (ref 101–111)
CO2: 20 mmol/L — AB (ref 22–32)
CREATININE: 1.31 mg/dL — AB (ref 0.61–1.24)
GFR calc Af Amer: 57 mL/min — ABNORMAL LOW (ref >60)
GFR calc non Af Amer: 49 mL/min — ABNORMAL LOW (ref >60)
GLUCOSE: 94 mg/dL (ref 65–99)
Potassium: 4.5 mmol/L (ref 3.5–5.1)
SODIUM: 141 mmol/L (ref 135–145)
Total Bilirubin: 0.4 mg/dL (ref 0.3–1.2)
Total Protein: 5.2 g/dL — ABNORMAL LOW (ref 6.5–8.1)

## 2015-06-01 LAB — URINE CULTURE: CULTURE: NO GROWTH

## 2015-06-01 LAB — ACETAMINOPHEN LEVEL: Acetaminophen (Tylenol), Serum: <10 ug/mL — ABNORMAL LOW (ref 10–30)

## 2015-06-01 LAB — HEMOGLOBIN A1C
Hgb A1c MFr Bld: 6.5 % — ABNORMAL HIGH (ref 4.8–5.6)
Mean Plasma Glucose: 140 mg/dL

## 2015-06-01 MED ORDER — IPRATROPIUM BROMIDE 0.02 % IN SOLN
0.5000 mg | Freq: Three times a day (TID) | RESPIRATORY_TRACT | Status: DC
  Administered 2015-06-02: 0.5 mg via RESPIRATORY_TRACT
  Filled 2015-06-01: qty 2.5

## 2015-06-01 MED ORDER — WHITE PETROLATUM GEL
Status: AC
  Administered 2015-06-01: 0.2
  Filled 2015-06-01: qty 1

## 2015-06-01 NOTE — Progress Notes (Signed)
[facility] PROGRESS NOTE  Brian [name] PIR:[id number] DOB: March 30, 1931 DOA: 05/30/2015 PCP: Binnie Rail, MD  Brief Summary  Brian [name] is a 82 y.o. male with history of CAD (coronary artery disease); Diabetes mellitus (2000); Gout; ED (erectile dysfunction); Hyperlipidemia; Colon polyps; Macular degeneration; AAA (abdominal aortic aneurysm) ([place]); Asthma (1976); HTN (hypertension); COPD (chronic obstructive pulmonary disease) ([place]); Myocardial infarction ([place]) (1 -13-2012); Neuropathy ([place]); and Bronchiolitis (May 2016).   Presented with < 24 hours of acute onset of diarrhea, fever, nausea vomiting body aches after eating a cheese steak.  Patient presented to urgent care and was sent to ER by EMS.  He could not keep anything down may have had 40-50 BM's  Assessment/Plan  SIRS with fever, tachycardia, tachypnea in association with gastroenteritis.  Stools slowing down, still having nausea, but starting to eat and drink a little.  Hypomagnesemia and nongap metabolic acidosis related to diarrhea.  -  GI pathogen panel -  D/c IVF -  Hold on antibiotics since likely viral infection -  BCx NGTD  CAD, stable, chest pain free, continue aspirin, and BB.  Hold statin due to rising LFTs  COPD, stable, continue atrovent  Hypertension, blood pressures are low normal.   -  Continue low dose metoprolol -  Hold ACEI  Generalized weakness due to acute illness -  PT recommending cardiopulmonary rehab  AAA, 4.7 cm infrarenal AAA seen on CT -  F/u with vascular surgery  Rising LFTs despite IVF, likely related to viral illness -  Acute hepatitis panel and tylenol level -  D/c tylenol and statin   Mild normocytic anemia and thrombocytopenia likely related to acute illness and marrow suppression -  Repeat CBC in AM  Diet:  Advance to regular Access:  PIV IVF:  off Proph:  lovenox  Code Status: full Family Communication: patient and son Disposition Plan: pending tolerating  regular diet and able to stay hydrated.  Diarrhea slowing down.  Likely home tomorrow   Consultants:  None  Procedures:  CT ab/p  Antibiotics:  none   HPI/Subjective:  Still having watery diarrhea but decreasing in frequency.  Having nausea but was able to eat some yesterday and will try to eat some breakfast today.  Denies abdominal pain.   Feels very weak and needs assistance going to the bathroom.    Objective: Filed Vitals:   05/31/15 2020 06/01/15 0447 06/01/15 0827 06/01/15 0855  BP:  109/43  120/55  Pulse:  63  77  Temp: 98.1 F (36.7 C) 98.2 F (36.8 C)  98.7 F (37.1 C)  TempSrc: Oral Oral  Oral  Resp: '20 18  18  '$ Height:      Weight: 78.7 kg (173 lb 8 oz)     SpO2: 97% 90% 95% 95%    Intake/Output Summary (Last 24 hours) at 06/01/15 1554 Last data filed at 06/01/15 1508  Gross per 24 hour  Intake   2055 ml  Output    200 ml  Net   1855 ml   Filed Weights   05/30/15 2132 05/31/15 0220 05/31/15 2020  Weight: 78.926 kg (174 lb) 78.8 kg (173 lb 11.6 oz) 78.7 kg (173 lb 8 oz)   Body mass index is 24.21 kg/(m^2).  Exam:   General:  Adult male, No acute distress  HEENT:  NCAT, MMM  Cardiovascular:  RRR, nl S1, S2 no mrg, 2+ pulses, warm extremities  Respiratory:  CTAB, no increased WOB  Abdomen:   Hyperactive BS, soft, NT/ND  MSK:   Normal tone and bulk, no LEE  Neuro:  Grossly intact  Data Reviewed: Basic Metabolic Panel:  Recent Labs Lab 05/30/15 2217 05/31/15 0225 06/01/15 0834  NA 141 143 141  K 3.6 4.1 4.5  CL 110 112* 114*  CO2 20* 19* 20*  GLUCOSE 144* 155* 94  BUN 23* 20 19  CREATININE 1.71* 1.67* 1.31*  CALCIUM 8.4* 7.9* 7.6*  MG  --  1.4*  --   PHOS  --  3.4  --    Liver Function Tests:  Recent Labs Lab 05/30/15 2217 05/31/15 0225 06/01/15 0834  AST 28 48* 89*  ALT 19 31 108*  ALKPHOS 49 44 57  BILITOT 0.8 0.7 0.4  PROT 6.3* 6.4* 5.2*  ALBUMIN 3.4* 3.3* 2.7*   No results for input(s): LIPASE, AMYLASE in the  last 168 hours. No results for input(s): AMMONIA in the last 168 hours. CBC:  Recent Labs Lab 05/30/15 2217 05/31/15 0225 06/01/15 0834  WBC 13.0* 15.6* 7.9  NEUTROABS 12.4*  --   --   HGB 13.5 12.6* 11.4*  HCT 40.5 39.8 35.1*  MCV 92.7 93.9 95.6  PLT 185 178 137*    Recent Results (from the past 240 hour(s))  Blood Culture (routine x 2)     Status: None (Preliminary result)   Collection Time: 05/30/15 10:17 PM  Result Value Ref Range Status   Specimen Description BLOOD RIGHT FOREARM  Final   Special Requests BOTTLES DRAWN AEROBIC ONLY 10CC  Final   Culture NO GROWTH 2 DAYS  Final   Report Status PENDING  Incomplete  Blood Culture (routine x 2)     Status: None (Preliminary result)   Collection Time: 05/30/15 10:25 PM  Result Value Ref Range Status   Specimen Description BLOOD RIGHT HAND  Final   Special Requests BOTTLES DRAWN AEROBIC ONLY 5CC  Final   Culture NO GROWTH 2 DAYS  Final   Report Status PENDING  Incomplete  Urine culture     Status: None   Collection Time: 05/31/15 12:32 AM  Result Value Ref Range Status   Specimen Description URINE, CLEAN CATCH  Final   Special Requests NONE  Final   Culture NO GROWTH 1 DAY  Final   Report Status 06/01/2015 FINAL  Final     Studies: Dg Chest 2 View  05/30/2015  CLINICAL DATA:  Fever body aches nausea and vomiting. EXAM: CHEST  2 VIEW COMPARISON:  04/26/2015 FINDINGS: There is hyperinflation lungs are clear. The pulmonary vasculature is normal. There is no pleural effusion. Hilar and mediastinal contours are unremarkable and unchanged. There is prior sternotomy and CABG. IMPRESSION: Hyperinflation.  No acute findings. Electronically Signed   By: Andreas Newport M.D.   On: 05/30/2015 22:47   Ct Abdomen Pelvis W Contrast  05/31/2015  CLINICAL DATA:  Nausea vomiting diarrhea and fever for 1 day. EXAM: CT ABDOMEN AND PELVIS WITH CONTRAST TECHNIQUE: Multidetector CT imaging of the abdomen and pelvis was performed using the  standard protocol following bolus administration of intravenous contrast. CONTRAST:  31m ISOVUE-300 IOPAMIDOL (ISOVUE-300) INJECTION 61% COMPARISON:  12/30/2012 FINDINGS: Lower chest:  No significant abnormality Hepatobiliary: There are calculi within the gallbladder lumen. No bile duct dilatation. Liver is unremarkable. Pancreas: Normal Spleen: Go Adrenals/Urinary Tract: The adrenals and kidneys are normal in appearance. There is no urinary calculus evident. There is no hydronephrosis or ureteral dilatation. Collecting systems and ureters appear unremarkable. Stomach/Bowel: The stomach is unremarkable. There is abnormal dilatation of small  bowel to a mild degree, throughout its entire length of without caliber transition. There is slightly prominent mural enhancement, particularly in the ileum. This may represent enteritis. No significant mural or mesenteric edema. Vascular/Lymphatic: There is a 4.7 cm infrarenal abdominal aortic aneurysm, not significantly different from 12/30/2012. Reproductive: Unremarkable Other: No focal acute inflammatory changes are evident in the abdomen or pelvis. There is no ascites. There is no adenopathy. Musculoskeletal: No significant skeletal lesions are evident. IMPRESSION: 1. Mild abnormal small bowel dilatation without caliber transition. This may represent a mild degree of enteritis. Very low-grade partial obstruction at the terminal ileum cannot be entirely excluded. 2. Cholelithiasis 3. Unchanged 4.7 cm infrarenal abdominal aortic aneurysm. Electronically Signed   By: Andreas Newport M.D.   On: 05/31/2015 00:28   Dg Chest Port 1 View  06/01/2015  CLINICAL DATA:  Rows, cough, coronary artery disease post MI, diabetes mellitus, COPD, hypertension EXAM: PORTABLE CHEST 1 VIEW COMPARISON:  Portable exam 1141 hours compared to 05/30/2015 FINDINGS: Upper normal size of cardiac silhouette post CABG. Atherosclerotic calcification mild tortuosity of thoracic aorta. Additional  convex density superimposed with LEFT pulmonary hilum, more prominent than on prior exam. Significance is uncertain; hilar/mediastinal adenopathy not entirely excluded. Minimal bibasilar atelectasis. Lungs emphysematous but otherwise clear. No pleural effusion or pneumothorax. Bones demineralized. IMPRESSION: Post CABG. COPD changes with bibasilar atelectasis. Abnormal soft tissue density superimposed with the LEFT pulmonary hilum uncertain etiology, more prominent than on the preceding study but current exam is AP and prior study was PA. Adenopathy not completely excluded. CT chest with contrast recommended to further evaluate. Electronically Signed   By: Lavonia Dana M.D.   On: 06/01/2015 13:11    Scheduled Meds: . antiseptic oral rinse  7 mL Mouth Rinse BID  . aspirin  81 mg Oral Daily  . enoxaparin (LOVENOX) injection  40 mg Subcutaneous Q24H  . guaiFENesin  600 mg Oral BID  . insulin aspart  0-5 Units Subcutaneous QHS  . insulin aspart  0-9 Units Subcutaneous TID WC  . ipratropium  0.5 mg Nebulization Q6H  . loratadine  10 mg Oral Daily  . metoprolol succinate  12.5 mg Oral Daily  . simvastatin  40 mg Oral QHS  . sodium chloride flush  3 mL Intravenous Q12H   Continuous Infusions:   Active Problems:   CAD (coronary artery disease)   Hypertension   Diabetes ([place])   SIRS (systemic inflammatory response syndrome) (HCC)   COPD (chronic obstructive pulmonary disease) (HCC)   Diarrhea   CKD (chronic kidney disease) stage 3, GFR 30-59 ml/min   Acute on chronic renal failure (HCC)   Gastroenteritis    Time spent: 30 min    Emelie Newsom, [place] [facility] Pager [phone]. If 7PM-7AM, please contact night-coverage at [email or web address], password [facility] 06/01/2015, 3:54 PM

## 2015-06-01 NOTE — Evaluation (Signed)
Physical Therapy Evaluation Patient Details Name: Brian [name] MRN: [id number] DOB: [birth year] Today's Date: 06/01/2015   History of Present Illness  82 y.o. male with Hx of CAD and COPD. Admitted for SIRS with fever, tachycardia, tachypnea in association with gastroenteritis  Clinical Impression  Pt admitted with above diagnosis. Pt currently with functional limitations due to the deficits listed below (see PT Problem List). SpO2 87% on room air while ambulating, 90% at rest on room air, and 97% on 2L supplemental O2 at rest. Minimal sway noted with gait, able to self correct. Educated on energy conservation techniques. Limited in community due to dyspnea with need for frequent rest breaks (would greatly benefit from rollator.) Pt will benefit from skilled PT to increase their independence and safety with mobility to allow discharge to the venue listed below.       Follow Up Recommendations Other (comment) (Cardiopulmonary rehab)    Equipment Recommendations  Other (comment) (Rollator)    Recommendations for Other Services       Precautions / Restrictions Precautions Precaution Comments: Monitor O2 Restrictions Weight Bearing Restrictions: No      Mobility  Bed Mobility               General bed mobility comments: In recliner  Transfers Overall transfer level: Needs assistance Equipment used: None Transfers: Sit to/from Stand Sit to Stand: Supervision         General transfer comment: supervision for safety. No physical assist required. Min sway noted but able to self correct.  Ambulation/Gait Ambulation/Gait assistance: Supervision Ambulation Distance (Feet): 115 Feet Assistive device: None Gait Pattern/deviations: Step-through pattern;Decreased stride length;Drifts right/left Gait velocity: decreased Gait velocity interpretation: Below normal speed for age/gender General Gait Details: Some sway noted but able to self-correct with supervision provided for  safety throughout distance. 4/4 dyspnea requiring 1 prolonged standing rest break but resolved. SpO2 87% on room air, improved quickly upon sitting.  Stairs            Wheelchair Mobility    Modified Rankin (Stroke Patients Only)       Balance Overall balance assessment: Needs assistance Sitting-balance support: No upper extremity supported;Feet supported Sitting balance-Leahy Scale: Good     Standing balance support: No upper extremity supported Standing balance-Leahy Scale: Good                               Pertinent Vitals/Pain Pain Assessment: No/denies pain    Home Living Family/patient expects to be discharged to:: Private residence Living Arrangements: Alone Available Help at Discharge: Friend(s);Family;Available PRN/intermittently Type of Home: House Home Access: Stairs to enter Entrance Stairs-Rails: None Entrance Stairs-Number of Steps: 1 Home Layout: One level Home Equipment: None      Prior Function Level of Independence: Independent               Hand Dominance   Dominant Hand: Right    Extremity/Trunk Assessment   Upper Extremity Assessment: Defer to OT evaluation           Lower Extremity Assessment: Generalized weakness         Communication   Communication: No difficulties  Cognition Arousal/Alertness: Awake/alert Behavior During Therapy: WFL for tasks assessed/performed Overall Cognitive Status: Within Functional Limits for tasks assessed                      General Comments General comments (skin integrity, edema, etc.): SpO2 at rest  on 2L 97%    Exercises        Assessment/Plan    PT Assessment Patient needs continued PT services  PT Diagnosis Difficulty walking;Abnormality of gait;Generalized weakness   PT Problem List Decreased strength;Decreased activity tolerance;Decreased balance;Decreased mobility;Decreased knowledge of use of DME;Cardiopulmonary status limiting activity  PT  Treatment Interventions DME instruction;Gait training;Functional mobility training;Therapeutic activities;Balance training;Stair training;Therapeutic exercise;Patient/family education   PT Goals (Current goals can be found in the Care Plan section) Acute Rehab PT Goals Patient Stated Goal: Go home by tomorrow PT Goal Formulation: With patient Time For Goal Achievement: 06/15/15 Potential to Achieve Goals: Good    Frequency Min 3X/week   Barriers to discharge Decreased caregiver support lives alone    Co-evaluation               End of Session Equipment Utilized During Treatment: Gait belt Activity Tolerance: Patient tolerated treatment well Patient left: in chair;with call bell/phone within reach Nurse Communication: Mobility status (sats)    Functional Assessment Tool Used: clinical observation Functional Limitation: Mobility: Walking and moving around Mobility: Walking and Moving Around Current Status [phone]): At least 1 percent but less than 20 percent impaired, limited or restricted Mobility: Walking and Moving Around Goal Status [phone]): At least 1 percent but less than 20 percent impaired, limited or restricted    Time: 1350-1406 PT Time Calculation (min) (ACUTE ONLY): 16 min   Charges:   PT Evaluation $PT Eval Low Complexity: 1 Procedure     PT G Codes:   PT G-Codes **NOT FOR INPATIENT CLASS** Functional Assessment Tool Used: clinical observation Functional Limitation: Mobility: Walking and moving around Mobility: Walking and Moving Around Current Status (H3716): At least 1 percent but less than 20 percent impaired, limited or restricted Mobility: Walking and Moving Around Goal Status [phone]): At least 1 percent but less than 20 percent impaired, limited or restricted    Ellouise Newer 06/01/2015, 3:42 PM  Camille Bal [place], [place]

## 2015-06-01 NOTE — Progress Notes (Signed)
PT Cancellation Note  Patient Details Name: Brian [name] MRN: [id number] DOB: 1931/10/20   Cancelled Treatment:    Reason Eval/Treat Not Completed: Patient at procedure or test/unavailable NT assisting patient in room at this time. Will follow up shortly for PT evaluation.  Ellouise Newer 06/01/2015, 10:57 AM  Elayne Snare, [place]

## 2015-06-02 ENCOUNTER — Telehealth: Payer: Self-pay | Admitting: *Deleted

## 2015-06-02 ENCOUNTER — Encounter: Payer: Self-pay | Admitting: Internal Medicine

## 2015-06-02 DIAGNOSIS — R112 Nausea with vomiting, unspecified: Secondary | ICD-10-CM | POA: Diagnosis not present

## 2015-06-02 DIAGNOSIS — N179 Acute kidney failure, unspecified: Secondary | ICD-10-CM | POA: Diagnosis not present

## 2015-06-02 DIAGNOSIS — A09 Infectious gastroenteritis and colitis, unspecified: Secondary | ICD-10-CM

## 2015-06-02 DIAGNOSIS — R6510 Systemic inflammatory response syndrome (SIRS) of non-infectious origin without acute organ dysfunction: Secondary | ICD-10-CM | POA: Diagnosis not present

## 2015-06-02 DIAGNOSIS — R197 Diarrhea, unspecified: Secondary | ICD-10-CM

## 2015-06-02 LAB — COMPREHENSIVE METABOLIC PANEL
ALT: 86 U/L — ABNORMAL HIGH (ref 17–63)
ANION GAP: 11 (ref 5–15)
AST: 54 U/L — ABNORMAL HIGH (ref 15–41)
Albumin: 3.1 g/dL — ABNORMAL LOW (ref 3.5–5.0)
Alkaline Phosphatase: 61 U/L (ref 38–126)
BUN: 13 mg/dL (ref 6–20)
CHLORIDE: 111 mmol/L (ref 101–111)
CO2: 20 mmol/L — AB (ref 22–32)
Calcium: 8.1 mg/dL — ABNORMAL LOW (ref 8.9–10.3)
Creatinine, Ser: 1.17 mg/dL (ref 0.61–1.24)
GFR, EST NON AFRICAN AMERICAN: 57 mL/min — AB (ref >60)
Glucose, Bld: 97 mg/dL (ref 65–99)
POTASSIUM: 4.8 mmol/L (ref 3.5–5.1)
SODIUM: 142 mmol/L (ref 135–145)
Total Bilirubin: 0.5 mg/dL (ref 0.3–1.2)
Total Protein: 5.8 g/dL — ABNORMAL LOW (ref 6.5–8.1)

## 2015-06-02 LAB — CBC
HCT: 37.8 % — ABNORMAL LOW (ref 39.0–52.0)
Hemoglobin: 12.1 g/dL — ABNORMAL LOW (ref 13.0–17.0)
MCH: 30.3 pg (ref 26.0–34.0)
MCHC: 32 g/dL (ref 30.0–36.0)
MCV: 94.5 fL (ref 78.0–100.0)
PLATELETS: 163 10*3/uL (ref 150–400)
RBC: 4 MIL/uL — AB (ref 4.22–5.81)
RDW: 13.2 % (ref 11.5–15.5)
WBC: 6.3 10*3/uL (ref 4.0–10.5)

## 2015-06-02 LAB — HEPATITIS PANEL, ACUTE
HEP A IGM: NEGATIVE
HEP B S AG: NEGATIVE
Hep B C IgM: NEGATIVE

## 2015-06-02 LAB — GLUCOSE, CAPILLARY
GLUCOSE-CAPILLARY: 72 mg/dL (ref 65–99)
GLUCOSE-CAPILLARY: 107 mg/dL — AB (ref 65–99)

## 2015-06-02 NOTE — Telephone Encounter (Signed)
Transition Care Management Follow-up Telephone Call   Date discharged? 06/02/15   How have you been since you were released from the hospital? Pt states he is doing all right came home this morning   Do you understand why you were in the hospital? YES   Do you understand the discharge instructions? YES   Where were you discharged to? Home   Items Reviewed:  Medications reviewed: YES  Allergies reviewed: YES  Dietary changes reviewed: YES  Referrals reviewed: No referral needed   Functional Questionnaire:   Activities of Daily Living (ADLs):   He states he are independent in the following: ambulation, bathing and hygiene, feeding, continence, grooming, toileting and dressing States he doesn't require assistance   Any transportation issues/concerns?: YES       Any patient concerns? NO   Confirmed importance and date/time of follow-up visits scheduled{YES, appt 06/09/15  Provider Appointment booked with Terri Piedra  Confirmed with patient if condition begins to worsen call PCP or go to the ER.  Patient was given the office number and encouraged to call back with question or concerns.  : YES

## 2015-06-02 NOTE — Discharge Summary (Signed)
Physician Discharge Summary  Brian [name] XMI:[id number] DOB: February 07, 1932 DOA: 05/30/2015  PCP: Binnie Rail, MD  Admit date: 05/30/2015 Discharge date: 06/02/2015  Time spent: 25 minutes  Recommendations for Outpatient Follow-up:  1. Discharge home with outpatient PCP follow-up in one week. Please check LFTs. If improved , may resume statin.   Discharge Diagnoses:  Principal Problem:   SIRS (systemic inflammatory response syndrome) (HCC)   Active Problems:   Acute viral Gastroenteritis (norovirus)   Diarrhea   CAD (coronary artery disease)   Hypertension   Diabetes (HCC)   COPD (chronic obstructive pulmonary disease) (HCC)   CKD (chronic kidney disease) stage 3, GFR 30-59 ml/min   Acute on chronic renal failure (HCC)   Transaminitis    Discharge Condition: Fair  Diet recommendation: Diabetic/heart healthy  CODE STATUS: Full code  Filed Weights   05/31/15 0220 05/31/15 2020 06/01/15 2001  Weight: 78.8 kg (173 lb 11.6 oz) 78.7 kg (173 lb 8 oz) 78.6 kg (173 lb 4.5 oz)    History of present illness:  Please refer to admission H&P for details, in brief, 82 year old male with a C of CAD, diabetes mellitus, gout, AAA, hypertension, COPD, coronary artery disease presented with acute onset of nonbloody diarrhea associated with fever, nausea, vomiting and bodyaches after eating a cheesesteak. Reported unable to keep anything down and had numerous episodes of diarrhea. Initially presented to urgent care and sent to the ED.  Patient found to have SIRS with associated gastroenteritis. Admitted to hospitalist service.  Hospital Course:  SIRS secondary to acute norovirus gastroenteritis Patient was febrile with tachycardia, tachypnea. Symptoms secondary to acute viral gastroenteritis. Monitor with IV fluids, antiemetics and Lomotil. Had non-anion gap metabolic acidosis and hypomagnesemia secondary to diarrhea. Improved with hydration. Blood cultures negative.  No further  diarrhea since overnight. Nausea and vomiting improved. Stable to be discharged home. Encouraged hydration.  Acute transaminitis Possibly due to viral illness. Hepatitis panel negative. LFTs improving. Will hold his statin until LFTs stable  outpatient  Essential hypertension Blood pressure low normal due to dehydration. Now improved with fluids. Resume home medications.  CAD, continue aspirin, and BB. Holding statin due to transaminitis.  COPD stable, continue atrovent    Generalized weakness  due to acute illness 82 recommended cardiopulmonary rehabilitation  AAA 4.7 cm infrarenal AAA seen on CT - F/u with vascular surgery  Patient stable to be discharged home with outpatient follow-up  Procedures:  None  Consultations:  None  Discharge Exam: Filed Vitals:   06/02/15 0430 06/02/15 0810  BP: 135/54 139/54  Pulse: 66 70  Temp: 98.2 F (36.8 C) 99.5 F (37.5 C)  Resp: 18 17    General: Elderly male not in distress HEENT: No pallor, moist mucosa, supple neck Chest: Clear bilaterally  CVS: Normal S1 and S2, no murmurs rub or gallop GI: Soft, nondistended, nontender, bowel sounds present Musculoskeletal: Warm, no edema CNS: Alert and oriented  Discharge Instructions    Current Discharge Medication List    CONTINUE these medications which have NOT CHANGED   Details  albuterol (PROVENTIL HFA;VENTOLIN HFA) 108 (90 Base) MCG/ACT inhaler Inhale 2 puffs into the lungs every 6 (six) hours as needed for wheezing or shortness of breath.    Aspirin (ADULT ASPIRIN LOW STRENGTH) 81 MG EC tablet Take 81 mg by mouth daily.      capsaicin (ZOSTRIX) 0.025 % cream Apply 1 application topically as needed (for irritation).     cetirizine (ZYRTEC) 10 MG tablet Take 10 mg  by mouth daily.    cholecalciferol (VITAMIN D) 1000 UNITS tablet Take 2,000 Units by mouth daily.     enalapril (VASOTEC) 10 MG tablet Take 1.5 tablets (15 mg total) by mouth daily. Qty: 135 tablet,  Refills: 3    loperamide (IMODIUM A-D) 2 MG tablet Take 2 mg by mouth 4 (four) times daily as needed for diarrhea or loose stools.    Magnesium Oxide 420 MG TABS Take 420 mg by mouth daily.     metoprolol succinate (TOPROL XL) 25 MG 24 hr tablet Take 0.5 tablets (12.5 mg total) by mouth daily. Qty: 45 tablet, Refills: 3    Multiple Vitamins-Minerals (MULTIVITAL PO) Take 1 tablet by mouth daily.          temazepam (RESTORIL) 7.5 MG capsule Take 1 capsule (7.5 mg total) by mouth at bedtime as needed. Qty: 90 capsule, Refills: 0    triamcinolone (NASACORT AQ) 55 MCG/ACT AERO nasal inhaler Place 2 sprays into the nose daily. Qty: 1 Inhaler, Refills: 12    ACCU-CHEK COMPACT STRIPS test strip TEST BLOOD SUGAR ONCE DAILY Qty: 100 each, Refills: 1      STOP taking these medications     predniSONE (DELTASONE) 20 MG tablet   simvastatin (ZOCOR) 40 MG tablet        Allergies  Allergen Reactions  . Meperidine Hcl     Tongue swelling Because of a history of documented adverse serious drug reaction;Medi Alert bracelet  is recommended  . Zolpidem Tartrate     REACTION: Crazy dreams   Follow-up Information    Follow up with Binnie Rail, MD. Schedule an appointment as soon as possible for a visit in 1 week.   Specialty:  Internal Medicine   Contact information:   [place] [address] [phone]        The results of significant diagnostics from this hospitalization (including imaging, microbiology, ancillary and laboratory) are listed below for reference.    Significant Diagnostic Studies: Dg Chest 2 View  05/30/2015  CLINICAL DATA:  Fever body aches nausea and vomiting. EXAM: CHEST  2 VIEW COMPARISON:  04/26/2015 FINDINGS: There is hyperinflation lungs are clear. The pulmonary vasculature is normal. There is no pleural effusion. Hilar and mediastinal contours are unremarkable and unchanged. There is prior sternotomy and CABG. IMPRESSION: Hyperinflation.  No  acute findings. Electronically Signed   By: Andreas Newport M.D.   On: 05/30/2015 22:47   Ct Abdomen Pelvis W Contrast  05/31/2015  CLINICAL DATA:  Nausea vomiting diarrhea and fever for 1 day. EXAM: CT ABDOMEN AND PELVIS WITH CONTRAST TECHNIQUE: Multidetector CT imaging of the abdomen and pelvis was performed using the standard protocol following bolus administration of intravenous contrast. CONTRAST:  77m ISOVUE-300 IOPAMIDOL (ISOVUE-300) INJECTION 61% COMPARISON:  12/30/2012 FINDINGS: Lower chest:  No significant abnormality Hepatobiliary: There are calculi within the gallbladder lumen. No bile duct dilatation. Liver is unremarkable. Pancreas: Normal Spleen: Go Adrenals/Urinary Tract: The adrenals and kidneys are normal in appearance. There is no urinary calculus evident. There is no hydronephrosis or ureteral dilatation. Collecting systems and ureters appear unremarkable. Stomach/Bowel: The stomach is unremarkable. There is abnormal dilatation of small bowel to a mild degree, throughout its entire length of without caliber transition. There is slightly prominent mural enhancement, particularly in the ileum. This may represent enteritis. No significant mural or mesenteric edema. Vascular/Lymphatic: There is a 4.7 cm infrarenal abdominal aortic aneurysm, not significantly different from 12/30/2012. Reproductive: Unremarkable Other: No focal acute  inflammatory changes are evident in the abdomen or pelvis. There is no ascites. There is no adenopathy. Musculoskeletal: No significant skeletal lesions are evident. IMPRESSION: 1. Mild abnormal small bowel dilatation without caliber transition. This may represent a mild degree of enteritis. Very low-grade partial obstruction at the terminal ileum cannot be entirely excluded. 2. Cholelithiasis 3. Unchanged 4.7 cm infrarenal abdominal aortic aneurysm. Electronically Signed   By: Andreas Newport M.D.   On: 05/31/2015 00:28   Dg Chest Port 1 View  06/01/2015   CLINICAL DATA:  Rows, cough, coronary artery disease post MI, diabetes mellitus, COPD, hypertension EXAM: PORTABLE CHEST 1 VIEW COMPARISON:  Portable exam 1141 hours compared to 05/30/2015 FINDINGS: Upper normal size of cardiac silhouette post CABG. Atherosclerotic calcification mild tortuosity of thoracic aorta. Additional convex density superimposed with LEFT pulmonary hilum, more prominent than on prior exam. Significance is uncertain; hilar/mediastinal adenopathy not entirely excluded. Minimal bibasilar atelectasis. Lungs emphysematous but otherwise clear. No pleural effusion or pneumothorax. Bones demineralized. IMPRESSION: Post CABG. COPD changes with bibasilar atelectasis. Abnormal soft tissue density superimposed with the LEFT pulmonary hilum uncertain etiology, more prominent than on the preceding study but current exam is AP and prior study was PA. Adenopathy not completely excluded. CT chest with contrast recommended to further evaluate. Electronically Signed   By: Lavonia Dana M.D.   On: 06/01/2015 13:11    Microbiology: Recent Results (from the past 240 hour(s))  Blood Culture (routine x 2)     Status: None (Preliminary result)   Collection Time: 05/30/15 10:17 PM  Result Value Ref Range Status   Specimen Description BLOOD RIGHT FOREARM  Final   Special Requests BOTTLES DRAWN AEROBIC ONLY 10CC  Final   Culture NO GROWTH 2 DAYS  Final   Report Status PENDING  Incomplete  Blood Culture (routine x 2)     Status: None (Preliminary result)   Collection Time: 05/30/15 10:25 PM  Result Value Ref Range Status   Specimen Description BLOOD RIGHT HAND  Final   Special Requests BOTTLES DRAWN AEROBIC ONLY 5CC  Final   Culture NO GROWTH 2 DAYS  Final   Report Status PENDING  Incomplete  Urine culture     Status: None   Collection Time: 05/31/15 12:32 AM  Result Value Ref Range Status   Specimen Description URINE, CLEAN CATCH  Final   Special Requests NONE  Final   Culture NO GROWTH 1 DAY  Final    Report Status 06/01/2015 FINAL  Final  Gastrointestinal Panel by PCR , Stool     Status: Abnormal   Collection Time: 06/01/15 11:57 AM  Result Value Ref Range Status   Campylobacter species NOT DETECTED NOT DETECTED Final   Plesimonas shigelloides NOT DETECTED NOT DETECTED Final   Salmonella species NOT DETECTED NOT DETECTED Final   Yersinia enterocolitica NOT DETECTED NOT DETECTED Final   Vibrio species NOT DETECTED NOT DETECTED Final   Vibrio cholerae NOT DETECTED NOT DETECTED Final   Enteroaggregative E coli (EAEC) NOT DETECTED NOT DETECTED Final   Enteropathogenic E coli (EPEC) NOT DETECTED NOT DETECTED Final   Enterotoxigenic E coli (ETEC) NOT DETECTED NOT DETECTED Final   Shiga like toxin producing E coli (STEC) NOT DETECTED NOT DETECTED Final   E. coli O157 NOT DETECTED NOT DETECTED Final   Shigella/Enteroinvasive E coli (EIEC) NOT DETECTED NOT DETECTED Final   Cryptosporidium NOT DETECTED NOT DETECTED Final   Cyclospora cayetanensis NOT DETECTED NOT DETECTED Final   Entamoeba histolytica NOT DETECTED NOT DETECTED Final  Giardia lamblia NOT DETECTED NOT DETECTED Final   Adenovirus F40/41 NOT DETECTED NOT DETECTED Final   Astrovirus NOT DETECTED NOT DETECTED Final   Norovirus GI/GII DETECTED (A) NOT DETECTED Final    Comment: CRITICAL RESULT CALLED TO, READ BACK BY AND VERIFIED WITH: JAY NARANBAS AT 9892 06/01/15 SDR    Rotavirus A NOT DETECTED NOT DETECTED Final   Sapovirus (I, II, IV, and V) NOT DETECTED NOT DETECTED Final     Labs: Basic Metabolic Panel:  Recent Labs Lab 05/30/15 2217 05/31/15 0225 06/01/15 0834 06/02/15 0558  NA 141 143 141 142  K 3.6 4.1 4.5 4.8  CL 110 112* 114* 111  CO2 20* 19* 20* 20*  GLUCOSE 144* 155* 94 97  BUN 23* '20 19 13  '$ CREATININE 1.71* 1.67* 1.31* 1.17  CALCIUM 8.4* 7.9* 7.6* 8.1*  MG  --  1.4*  --   --   PHOS  --  3.4  --   --    Liver Function Tests:  Recent Labs Lab 05/30/15 2217 05/31/15 0225 06/01/15 0834  06/02/15 0558  AST 28 48* 89* 54*  ALT 19 31 108* 86*  ALKPHOS 49 44 57 61  BILITOT 0.8 0.7 0.4 0.5  PROT 6.3* 6.4* 5.2* 5.8*  ALBUMIN 3.4* 3.3* 2.7* 3.1*   No results for input(s): LIPASE, AMYLASE in the last 168 hours. No results for input(s): AMMONIA in the last 168 hours. CBC:  Recent Labs Lab 05/30/15 2217 05/31/15 0225 06/01/15 0834 06/02/15 0558  WBC 13.0* 15.6* 7.9 6.3  NEUTROABS 12.4*  --   --   --   HGB 13.5 12.6* 11.4* 12.1*  HCT 40.5 39.8 35.1* 37.8*  MCV 92.7 93.9 95.6 94.5  PLT 185 178 137* 163   Cardiac Enzymes:  Recent Labs Lab 05/31/15 0225 05/31/15 0825 05/31/15 1505  TROPONINI <0.03 0.03 0.03   BNP: BNP (last 3 results) No results for input(s): BNP in the last 8760 hours.  ProBNP (last 3 results) No results for input(s): PROBNP in the last 8760 hours.  CBG:  Recent Labs Lab 06/01/15 0752 06/01/15 1210 06/01/15 1616 06/01/15 2152 06/02/15 0809  GLUCAP 87 89 88 88 72       Signed:  Louellen Molder MD.  [facility] 06/02/2015, 10:03 AM

## 2015-06-02 NOTE — Discharge Instructions (Signed)
Diarrhea  Diarrhea is watery poop (stool). It can make you feel weak, tired, thirsty, or give you a dry mouth (signs of dehydration). Watery poop is a sign of another problem, most often an infection. It often lasts 2-3 days. It can last longer if it is a sign of something serious. Take care of yourself as told by your doctor.  HOME CARE   · Drink 1 cup (8 ounces) of fluid each time you have watery poop.  · Do not drink the following fluids:    Those that contain simple sugars (fructose, glucose, galactose, lactose, sucrose, maltose).    Sports drinks.    Fruit juices.    Whole milk products.    Sodas.    Drinks with caffeine (coffee, tea, soda) or alcohol.  · Oral rehydration solution may be used if the doctor says it is okay. You may make your own solution. Follow this recipe:    ?-? teaspoon table salt.    ¾ teaspoon baking soda.    ? teaspoon salt substitute containing potassium chloride.    1 ? tablespoons sugar.    1 liter (34 ounces) of water.  · Avoid the following foods:    High fiber foods, such as raw fruits and vegetables.    Nuts, seeds, and whole grain breads and cereals.     Those that are sweetened with sugar alcohols (xylitol, sorbitol, mannitol).  · Try eating the following foods:    Starchy foods, such as rice, toast, pasta, low-sugar cereal, oatmeal, baked potatoes, crackers, and bagels.    Bananas.    Applesauce.  · Eat probiotic-rich foods, such as yogurt and milk products that are fermented.  · Wash your hands well after each time you have watery poop.  · Only take medicine as told by your doctor.  · Take a warm bath to help lessen burning or pain from having watery poop.  GET HELP RIGHT AWAY IF:   · You cannot drink fluids without throwing up (vomiting).  · You keep throwing up.  · You have blood in your poop, or your poop looks black and tarry.  · You do not pee (urinate) in 6-8 hours, or there is only a small amount of very dark pee.  · You have belly (abdominal) pain that gets worse or  stays in the same spot (localizes).  · You are weak, dizzy, confused, or light-headed.  · You have a very bad headache.  · Your watery poop gets worse or does not get better.  · You have a fever or lasting symptoms for more than 2-3 days.  · You have a fever and your symptoms suddenly get worse.  MAKE SURE YOU:   · Understand these instructions.  · Will watch your condition.  · Will get help right away if you are not doing well or get worse.     This information is not intended to replace advice given to you by your health care provider. Make sure you discuss any questions you have with your health care provider.     Document Released: 07/25/2007 Document Revised: 02/26/2014 Document Reviewed: 10/14/2011  Elsevier Interactive Patient Education ©2016 [employer]

## 2015-06-04 LAB — CULTURE, BLOOD (ROUTINE X 2)
Culture: NO GROWTH
Culture: NO GROWTH

## 2015-06-09 ENCOUNTER — Encounter: Payer: Self-pay | Admitting: Family

## 2015-06-09 ENCOUNTER — Other Ambulatory Visit (INDEPENDENT_AMBULATORY_CARE_PROVIDER_SITE_OTHER): Payer: Medicare Other

## 2015-06-09 ENCOUNTER — Ambulatory Visit (INDEPENDENT_AMBULATORY_CARE_PROVIDER_SITE_OTHER): Payer: Medicare Other | Admitting: Family

## 2015-06-09 VITALS — BP 114/62 | HR 57 | Temp 97.5°F | Resp 14 | Ht 71.0 in | Wt 178.0 lb

## 2015-06-09 DIAGNOSIS — K529 Noninfective gastroenteritis and colitis, unspecified: Secondary | ICD-10-CM | POA: Diagnosis not present

## 2015-06-09 DIAGNOSIS — Z1283 Encounter for screening for malignant neoplasm of skin: Secondary | ICD-10-CM | POA: Diagnosis not present

## 2015-06-09 DIAGNOSIS — R6510 Systemic inflammatory response syndrome (SIRS) of non-infectious origin without acute organ dysfunction: Secondary | ICD-10-CM

## 2015-06-09 LAB — COMPREHENSIVE METABOLIC PANEL
ALBUMIN: 4 g/dL (ref 3.5–5.2)
ALT: 20 U/L (ref 0–53)
AST: 13 U/L (ref 0–37)
Alkaline Phosphatase: 70 U/L (ref 39–117)
BUN: 17 mg/dL (ref 6–23)
CALCIUM: 9.5 mg/dL (ref 8.4–10.5)
CHLORIDE: 103 meq/L (ref 96–112)
CO2: 29 mEq/L (ref 19–32)
Creatinine, Ser: 1.51 mg/dL — ABNORMAL HIGH (ref 0.40–1.50)
GFR: 57.27 mL/min — AB (ref >60.00)
Glucose, Bld: 94 mg/dL (ref 70–99)
POTASSIUM: 4.8 meq/L (ref 3.5–5.1)
Sodium: 138 mEq/L (ref 135–145)
Total Bilirubin: 0.4 mg/dL (ref 0.2–1.2)
Total Protein: 7.3 g/dL (ref 6.0–8.3)

## 2015-06-09 NOTE — Assessment & Plan Note (Signed)
Symptoms of gastroenteritis appear resolved. Able to tolerate solid foods and advancing diet as tolerated. Encouraged continued consumption sports drinks. Obtain complete metabolic panel to check liver function, kidney function and electrolytes. Continue to hold statin medication at this time pending liver function tests. Follow-up if symptoms return or worsen.

## 2015-06-09 NOTE — Progress Notes (Signed)
Pre visit review using our clinic review tool, if applicable. No additional management support is needed unless otherwise documented below in the visit note. 

## 2015-06-09 NOTE — Patient Instructions (Signed)
Thank you for choosing [facility].  Summary/Instructions:  Please continue to drink plenty of fluids. Include sports drinks and advance your diet as tolerated.   Please stop in the lab for your blood work.  If your symptoms worsen or fail to improve, please contact our office for further instruction, or in case of emergency go directly to the emergency room at the closest medical facility.

## 2015-06-09 NOTE — Progress Notes (Signed)
Subjective:    Patient ID: [name], male    DOB: 08-Feb-1932, 82 y.o.   MRN: [id number]  Chief Complaint  Patient presents with  . Hospitalization Follow-up    has the noro virus and states that he feels much better he is just weak    HPI:  TYSHUN TUCKERMAN is a 82 y.o. male who  has a past medical history of CAD (coronary artery disease); Diabetes mellitus (2000); Gout; ED (erectile dysfunction); Hyperlipidemia; Colon polyps; Macular degeneration; AAA (abdominal aortic aneurysm) ([place]); Asthma (1976); HTN (hypertension); COPD (chronic obstructive pulmonary disease) ([place]); Myocardial infarction ([place]) (1 -13-2012); Neuropathy ([place]); and Bronchiolitis (May 2016). and presents today for an office visit.  Recently evaluated in the emergency department and admitted to the hospital for diarrhea of one day combined with chills, cough, fever, and vomiting. Physical exam with constitutional exam appearing ill and no other significant findings. Blood work Was significant for a glucose of 144, serum creatinine 1.71, calcium 8.4, GFR 41, white blood cells 13, and a normal urinalysis. Prior to arrival in the ED he was seen in urgent care with a fever of 102. Flu test was negative. CT scan consistent with possible gastroenteritis. He was admitted with concerns for systemic inflammatory response syndrome. He was monitored with IV fluids, and time at X, and Lomotil. Improved with hydration and blood cultures were negative. Also noted to have acute transaminitis possibly related to the viral illness. On discharge his LFTs were improving. His statin was held secondary to LFT elevation. He was resumed on home medications upon discharge. Hospitalist recommended follow-up with primary care with the recommendations of checking liver function tests. All hospital records, labs, and imaging reviewed in detail.  Since leaving the hospital he reports feeling weak but overall improved. Denies any additional instance of  diarrhea, vomiting, or nausea. No fevers. He is progressing his diet as tolerated. Reports drinking plenty of fluids. Has about 2 bowel movements per day which are formed. Normally 3-4 per day is his baseline.   Allergies  Allergen Reactions  . Meperidine Hcl     Tongue swelling Because of a history of documented adverse serious drug reaction;Medi Alert bracelet  is recommended  . Zolpidem Tartrate     REACTION: Crazy dreams     Current Outpatient Prescriptions on File Prior to Visit  Medication Sig Dispense Refill  . ACCU-CHEK COMPACT STRIPS test strip TEST BLOOD SUGAR ONCE DAILY 100 each 1  . albuterol (PROVENTIL HFA;VENTOLIN HFA) 108 (90 Base) MCG/ACT inhaler Inhale 2 puffs into the lungs every 6 (six) hours as needed for wheezing or shortness of breath.    . Aspirin (ADULT ASPIRIN LOW STRENGTH) 81 MG EC tablet Take 81 mg by mouth daily.      . capsaicin (ZOSTRIX) 0.025 % cream Apply 1 application topically as needed (for irritation).     . cetirizine (ZYRTEC) 10 MG tablet Take 10 mg by mouth daily.    . cholecalciferol (VITAMIN D) 1000 UNITS tablet Take 2,000 Units by mouth daily.     . enalapril (VASOTEC) 10 MG tablet Take 1.5 tablets (15 mg total) by mouth daily. 135 tablet 3  . Magnesium Oxide 420 MG TABS Take 420 mg by mouth daily.     . metoprolol succinate (TOPROL XL) 25 MG 24 hr tablet Take 0.5 tablets (12.5 mg total) by mouth daily. 45 tablet 3  . Multiple Vitamins-Minerals (MULTIVITAL PO) Take 1 tablet by mouth daily.     . temazepam (  RESTORIL) 7.5 MG capsule Take 1 capsule (7.5 mg total) by mouth at bedtime as needed. 90 capsule 0  . triamcinolone (NASACORT AQ) 55 MCG/ACT AERO nasal inhaler Place 2 sprays into the nose daily. 1 Inhaler 12   No current facility-administered medications on file prior to visit.     Past Surgical History  Procedure Laterality Date  . Incision and drainage perirectal abscess    . Colonoscopy      neg 2009  . Inguinal hernia repair    .  Cataract left eye    . 3 vessel cabg  03/03/2010  . Coronary artery bypass graft  2012    X3  . Colonoscopy  2014    5 mm sessile polyp; AVM  . Eye surgery      Review of Systems  Constitutional: Negative for fever and chills.  Respiratory: Negative for chest tightness and shortness of breath.   Cardiovascular: Negative for chest pain, palpitations and leg swelling.  Gastrointestinal: Negative for nausea, vomiting, abdominal pain, diarrhea, constipation, blood in stool, anal bleeding and rectal pain.      Objective:    BP 114/62 mmHg  Pulse 57  Temp(Src) 97.5 F (36.4 C) (Oral)  Resp 14  Ht _0  (1.803 m)  Wt 178 lb (80.74 kg)  BMI 24.84 kg/m2  SpO2 94% Nursing note and vital signs reviewed.  Physical Exam  Constitutional: He is oriented to person, place, and time. He appears well-developed and well-nourished. No distress.  Cardiovascular: Normal rate, regular rhythm, normal heart sounds and intact distal pulses.   Pulmonary/Chest: Effort normal and breath sounds normal.  Abdominal: Soft. Bowel sounds are normal. He exhibits no distension and no mass. There is no tenderness. There is no rebound and no guarding.  Neurological: He is alert and oriented to person, place, and time.  Skin: Skin is warm and dry.  Psychiatric: He has a normal mood and affect. His behavior is normal. Judgment and thought content normal.       Assessment & Plan:   Problem List Items Addressed This Visit      Digestive   Gastroenteritis - Primary    Symptoms of gastroenteritis appear resolved. Able to tolerate solid foods and advancing diet as tolerated. Encouraged continued consumption sports drinks. Obtain complete metabolic panel to check liver function, kidney function and electrolytes. Continue to hold statin medication at this time pending liver function tests. Follow-up if symptoms return or worsen.      Relevant Orders   Comp Met (CMET) (Completed)     Other   RESOLVED: SIRS  (systemic inflammatory response syndrome) (HCC)    Other Visit Diagnoses    Screening exam for skin cancer        Relevant Orders    Ambulatory referral to Dermatology        I have discontinued Mr. Closs loperamide. I am also having him maintain his Aspirin, cholecalciferol, ACCU-CHEK COMPACT STRIPS, capsaicin, Multiple Vitamins-Minerals (MULTIVITAL PO), Magnesium Oxide, cetirizine, triamcinolone, enalapril, temazepam, albuterol, and metoprolol succinate.   Follow-up: Return if symptoms worsen or fail to improve.  Mauricio Po, FNP

## 2015-06-30 DIAGNOSIS — L821 Other seborrheic keratosis: Secondary | ICD-10-CM | POA: Diagnosis not present

## 2015-06-30 DIAGNOSIS — D485 Neoplasm of uncertain behavior of skin: Secondary | ICD-10-CM | POA: Diagnosis not present

## 2015-06-30 DIAGNOSIS — D225 Melanocytic nevi of trunk: Secondary | ICD-10-CM | POA: Diagnosis not present

## 2015-07-13 ENCOUNTER — Encounter: Payer: Self-pay | Admitting: Vascular Surgery

## 2015-07-19 ENCOUNTER — Ambulatory Visit (INDEPENDENT_AMBULATORY_CARE_PROVIDER_SITE_OTHER)
Admission: RE | Admit: 2015-07-19 | Discharge: 2015-07-19 | Disposition: A | Discharge status: Home / Self Care | Payer: Medicare Other | Source: Ambulatory Visit | Attending: Vascular Surgery | Admitting: Vascular Surgery

## 2015-07-19 ENCOUNTER — Encounter: Payer: Self-pay | Admitting: Vascular Surgery

## 2015-07-19 ENCOUNTER — Ambulatory Visit (INDEPENDENT_AMBULATORY_CARE_PROVIDER_SITE_OTHER): Payer: Medicare Other | Admitting: Vascular Surgery

## 2015-07-19 ENCOUNTER — Ambulatory Visit (HOSPITAL_COMMUNITY)
Admission: RE | Admit: 2015-07-19 | Discharge: 2015-07-19 | Disposition: A | Discharge status: Home / Self Care | Payer: Medicare Other | Source: Ambulatory Visit | Attending: Vascular Surgery | Admitting: Vascular Surgery

## 2015-07-19 VITALS — BP 138/67 | HR 56 | Temp 96.7°F | Resp 18 | Ht 71.0 in | Wt 174.0 lb

## 2015-07-19 DIAGNOSIS — I2510 Atherosclerotic heart disease of native coronary artery without angina pectoris: Secondary | ICD-10-CM | POA: Diagnosis not present

## 2015-07-19 DIAGNOSIS — E785 Hyperlipidemia, unspecified: Secondary | ICD-10-CM | POA: Diagnosis not present

## 2015-07-19 DIAGNOSIS — I714 Abdominal aortic aneurysm, without rupture, unspecified: Secondary | ICD-10-CM

## 2015-07-19 DIAGNOSIS — R0989 Other specified symptoms and signs involving the circulatory and respiratory systems: Secondary | ICD-10-CM | POA: Diagnosis present

## 2015-07-19 DIAGNOSIS — I739 Peripheral vascular disease, unspecified: Secondary | ICD-10-CM | POA: Insufficient documentation

## 2015-07-19 DIAGNOSIS — E119 Type 2 diabetes mellitus without complications: Secondary | ICD-10-CM | POA: Diagnosis not present

## 2015-07-19 DIAGNOSIS — I10 Essential (primary) hypertension: Secondary | ICD-10-CM | POA: Insufficient documentation

## 2015-07-19 NOTE — Progress Notes (Signed)
Subjective:     Patient ID: Brian [name], male   DOB: 1931-08-28, 82 y.o.   MRN: [id number]  HPI this 82 year old male returns for continued follow-up regarding his abdominal aortic aneurysm which we have been following. It has measured between 4.7 and 4.8 cm in the past. He was scheduled to get a CT angiogram today but did go to the emergency room 06/01/2015 for nausea vomiting and diarrhea and a CT scan without contrast was performed. I have reviewed this scan reveals the aneurysm to be 4.7 cm in maximum diameter-no change. Patient's other symptoms have resolved.  Past Medical History  Diagnosis Date  . CAD (coronary artery disease)     NSTEMI 02/2010;  s/p CABG 1/12 (L-LAD, SOM1, S-PDA); echo in 08/2010: Mild LVH, EF 26%, grade 2 diastolic dysfunction, mild MR, mild LAE, mildly reduced RV function, mild RAE, PASP 42  . Diabetes mellitus 2000    adult onset  . Gout   . ED (erectile dysfunction)   . Hyperlipidemia   . Colon polyps   . Macular degeneration   . AAA (abdominal aortic aneurysm) ([place])   . Asthma 1976  . HTN (hypertension)   . COPD (chronic obstructive pulmonary disease) ([place])   . Myocardial infarction ([place]) 1 -13-2012  . Neuropathy ([place])   . Bronchiolitis May 2016    Social History  Substance Use Topics  . Smoking status: Former Smoker    Types: Cigarettes    Quit date: 02/20/2003  . Smokeless tobacco: Never Used  . Alcohol Use: 3.6 oz/week    3 Glasses of wine, 3 Shots of liquor per week     Comment:  1-3 drinks a week    Family History  Problem Relation Age of Onset  . Heart failure Mother   . Heart disease Mother     CHF  . Heart attack Mother   . Hypertension Mother   . Heart attack Father   . Heart disease Father 52    MI  . Hypertension Father   . Heart attack Sister 46  . Heart disease Sister 80    MI  . Diabetes Sister   . Hyperlipidemia Sister   . Hypertension Sister   . Varicose Veins Sister   . Heart attack Brother 27  . Diabetes Brother    . Heart disease Brother 8    MI  . Cancer Brother   . Hypertension Brother   . Deep vein thrombosis Daughter   . Other Son     varicose veins  . Colon cancer Neg Hx     Allergies  Allergen Reactions  . Meperidine Hcl     Tongue swelling Because of a history of documented adverse serious drug reaction;Medi Alert bracelet  is recommended  . Zolpidem Tartrate     REACTION: Crazy dreams     Current outpatient prescriptions:  .  ACCU-CHEK COMPACT STRIPS test strip, TEST BLOOD SUGAR ONCE DAILY, Disp: 100 each, Rfl: 1 .  albuterol (PROVENTIL HFA;VENTOLIN HFA) 108 (90 Base) MCG/ACT inhaler, Inhale 2 puffs into the lungs every 6 (six) hours as needed for wheezing or shortness of breath., Disp: , Rfl:  .  Aspirin (ADULT ASPIRIN LOW STRENGTH) 81 MG EC tablet, Take 81 mg by mouth daily.  , Disp: , Rfl:  .  capsaicin (ZOSTRIX) 0.025 % cream, Apply 1 application topically as needed (for irritation). , Disp: , Rfl:  .  cetirizine (ZYRTEC) 10 MG tablet, Take 10 mg by mouth daily., Disp: ,  Rfl:  .  cholecalciferol (VITAMIN D) 1000 UNITS tablet, Take 2,000 Units by mouth daily. , Disp: , Rfl:  .  enalapril (VASOTEC) 10 MG tablet, Take 1.5 tablets (15 mg total) by mouth daily., Disp: 135 tablet, Rfl: 3 .  Magnesium Oxide 420 MG TABS, Take 420 mg by mouth daily. , Disp: , Rfl:  .  metoprolol succinate (TOPROL XL) 25 MG 24 hr tablet, Take 0.5 tablets (12.5 mg total) by mouth daily., Disp: 45 tablet, Rfl: 3 .  Multiple Vitamins-Minerals (MULTIVITAL PO), Take 1 tablet by mouth daily. , Disp: , Rfl:  .  temazepam (RESTORIL) 7.5 MG capsule, Take 1 capsule (7.5 mg total) by mouth at bedtime as needed., Disp: 90 capsule, Rfl: 0 .  triamcinolone (NASACORT AQ) 55 MCG/ACT AERO nasal inhaler, Place 2 sprays into the nose daily., Disp: 1 Inhaler, Rfl: 12  Filed Vitals:   07/19/15 0917  BP: 138/67  Pulse: 56  Temp: 96.7 F (35.9 C)  Resp: 18  Height: '5\' 11"'$  (1.803 m)  Weight: 174 lb (78.926 kg)  SpO2:  95%    Body mass index is 24.28 kg/(m^2).           Review of Systems denies chest pain, dyspnea on exertion, PND, orthopnea, hemoptysis. Patient does have right calf claudication but is able to ambulate up to 1 mile. He has known right superficial femoral artery occlusion. Patient has history of COPD, asthma, coronary artery disease with previous coronary artery bypass grafting in 2012 but doing well. Other systems negative and complete review of systems     Objective:   Physical Exam BP 138/67 mmHg  Pulse 56  Temp(Src) 96.7 F (35.9 C)  Resp 18  Ht '5\' 11"'$  (1.803 m)  Wt 174 lb (78.926 kg)  BMI 24.28 kg/m2  SpO2 95%    Gen.-alert and oriented x3 in no apparent distress HEENT normal for age Lungs no rhonchi or wheezing Cardiovascular regular rhythm no murmurs carotid pulses 3+ palpable no bruits audible Abdomen soft nontender -5 cm pulsatile mass. Musculoskeletal free of  major deformities Skin clear -no rashes Neurologic normal Lower extremities 3+ femoral pulses palpable bilaterally. Left leg has 2-3+ popliteal pulse palpable. Right leg has absent popliteal and distal pulses.  Today I reviewed the CT scan which was performed 06/01/2015 and also reviewed the report from the radiologist. Patient does have a 4.7 cm infrarenal abdominal aortic aneurysm. The diameter of the neck in the infrarenal position is approximately 27 mm area      Assessment:     Stable abdominal aortic aneurysm with no evidence of enlargement or rupture past 6 months Coronary artery disease history coronary artery bypass grafting 2012 doing well COPD Hypertension    Plan:     Patient will return in 6 months with duplex scan in our office and see Dr. Servando Snare

## 2015-08-11 ENCOUNTER — Ambulatory Visit (INDEPENDENT_AMBULATORY_CARE_PROVIDER_SITE_OTHER): Payer: TRICARE For Life (TFL) | Admitting: Podiatry

## 2015-08-11 ENCOUNTER — Encounter: Payer: Self-pay | Admitting: Podiatry

## 2015-08-11 VITALS — BP 131/81 | HR 66 | Resp 12

## 2015-08-11 DIAGNOSIS — M79676 Pain in unspecified toe(s): Secondary | ICD-10-CM

## 2015-08-11 DIAGNOSIS — B351 Tinea unguium: Secondary | ICD-10-CM

## 2015-08-11 DIAGNOSIS — E1140 Type 2 diabetes mellitus with diabetic neuropathy, unspecified: Secondary | ICD-10-CM

## 2015-08-11 NOTE — Progress Notes (Signed)
Mr. [place] presents today for follow-up of his diabetes would like to consider having his nails cut.  Objective: Vital signs stable. Pulses are palpable. Neurologic sensorium is intact per Semmes-Weinstein monofilament and vibratory sensation is intact. Degenerative flexors are intact muscle strength is 5 over 5 dorsiflexion plantar flexors and inverters and everters all intrinsic musculature is intact. Orthopedic evaluation demonstrates FULL range of motion without crepitation. Cutaneous evaluation demonstrates no open lesions or wounds his toenails are thick yellow dystrophic with mycotic and painful palpation.  Assessment: Pain and limp secondary to onychomycosis 1 through 5 bilateral. Diabetes mellitus without complications.  Plan: Debridement of toenails 1 through 5 bilateral covered service secondary to pain. Follow up with him in 6 months

## 2015-09-20 ENCOUNTER — Ambulatory Visit (INDEPENDENT_AMBULATORY_CARE_PROVIDER_SITE_OTHER): Payer: Medicare Other | Admitting: Internal Medicine

## 2015-09-20 ENCOUNTER — Other Ambulatory Visit (INDEPENDENT_AMBULATORY_CARE_PROVIDER_SITE_OTHER): Payer: Medicare Other

## 2015-09-20 ENCOUNTER — Encounter: Payer: Self-pay | Admitting: Internal Medicine

## 2015-09-20 VITALS — BP 172/84 | HR 56 | Temp 97.2°F | Resp 16 | Wt 176.0 lb

## 2015-09-20 DIAGNOSIS — I10 Essential (primary) hypertension: Secondary | ICD-10-CM | POA: Diagnosis not present

## 2015-09-20 DIAGNOSIS — N183 Chronic kidney disease, stage 3 unspecified: Secondary | ICD-10-CM

## 2015-09-20 DIAGNOSIS — G4762 Sleep related leg cramps: Secondary | ICD-10-CM

## 2015-09-20 LAB — CBC WITH DIFFERENTIAL/PLATELET
Basophils Absolute: 0.1 10*3/uL (ref 0.0–0.1)
Basophils Relative: 0.9 % (ref 0.0–3.0)
EOS ABS: 0.3 10*3/uL (ref 0.0–0.7)
EOS PCT: 5.4 % — AB (ref 0.0–5.0)
HEMATOCRIT: 40.6 % (ref 39.0–52.0)
HEMOGLOBIN: 13.6 g/dL (ref 13.0–17.0)
LYMPHS PCT: 36.6 % (ref 12.0–46.0)
Lymphs Abs: 2.1 10*3/uL (ref 0.7–4.0)
MCHC: 33.4 g/dL (ref 30.0–36.0)
MCV: 90.6 fl (ref 78.0–100.0)
MONO ABS: 0.6 10*3/uL (ref 0.1–1.0)
Monocytes Relative: 11 % (ref 3.0–12.0)
Neutro Abs: 2.7 10*3/uL (ref 1.4–7.7)
Neutrophils Relative %: 46.1 % (ref 43.0–77.0)
Platelets: 217 10*3/uL (ref 150.0–400.0)
RBC: 4.48 Mil/uL (ref 4.22–5.81)
RDW: 14.5 % (ref 11.5–15.5)
WBC: 5.8 10*3/uL (ref 4.0–10.5)

## 2015-09-20 LAB — TSH: TSH: 1.16 u[IU]/mL (ref 0.35–4.50)

## 2015-09-20 LAB — COMPREHENSIVE METABOLIC PANEL
ALBUMIN: 4.1 g/dL (ref 3.5–5.2)
ALK PHOS: 57 U/L (ref 39–117)
ALT: 11 U/L (ref 0–53)
AST: 15 U/L (ref 0–37)
BILIRUBIN TOTAL: 0.5 mg/dL (ref 0.2–1.2)
BUN: 22 mg/dL (ref 6–23)
CALCIUM: 9.6 mg/dL (ref 8.4–10.5)
CO2: 28 mEq/L (ref 19–32)
CREATININE: 1.38 mg/dL (ref 0.40–1.50)
Chloride: 103 mEq/L (ref 96–112)
GFR: 63.5 mL/min (ref >60.00)
Glucose, Bld: 93 mg/dL (ref 70–99)
Potassium: 5.1 mEq/L (ref 3.5–5.1)
SODIUM: 138 meq/L (ref 135–145)
TOTAL PROTEIN: 7.3 g/dL (ref 6.0–8.3)

## 2015-09-20 LAB — MAGNESIUM: MAGNESIUM: 2 mg/dL (ref 1.5–2.5)

## 2015-09-20 LAB — HEMOGLOBIN A1C: Hgb A1c MFr Bld: 6.1 % (ref 4.6–6.5)

## 2015-09-20 NOTE — Progress Notes (Signed)
Pre visit review using our clinic review tool, if applicable. No additional management support is needed unless otherwise documented below in the visit note. 

## 2015-09-20 NOTE — Assessment & Plan Note (Signed)
BP well controlled at home Continue to monitor at home Continue current medications

## 2015-09-20 NOTE — Assessment & Plan Note (Signed)
Discussed all of the possible causes He will look for patterns Will check labs today Discussed leg stretching and medications that may help - both agree we should avoid meds if possible

## 2015-09-20 NOTE — Assessment & Plan Note (Signed)
Check cmp May be the cause of his nocturnal cramps

## 2015-09-20 NOTE — Patient Instructions (Signed)
  Test(s) ordered today. Your results will be released to [facility] (or called to you) after review, usually within 72hours after test completion. If any changes need to be made, you will be notified at that same time.    Medications reviewed and updated.  No changes recommended at this time.

## 2015-09-20 NOTE — Progress Notes (Signed)
Subjective:    Patient ID: Brian [name], male    DOB: Aug 12, 1931, 82 y.o.   MRN: [id number]  HPI He is here for an acute visit.  Leg cramps at night:  It started about 2-3 years ago.  The cramping occurs in ankles and calves.  It does not occur nightly.  He has to get up and walk around to get rid of the cramps.   He denies any pattern to the cramping - they do not seem too be related to activity, hydration or anything else.   He was told a few months ago his decreased kidney function may be the cause.    He checks his BP at home and his BP is well controlled - it is never this high.   Medications and allergies reviewed with patient and updated if appropriate.  Patient Active Problem List   Diagnosis Date Noted  . Nausea vomiting and diarrhea   . COPD (chronic obstructive pulmonary disease) ([place]) 05/31/2015  . Diarrhea 05/31/2015  . CKD (chronic kidney disease) stage 3, GFR 30-59 ml/min 05/31/2015  . Acute on chronic renal failure ([place]) 05/31/2015  . Gastroenteritis 05/31/2015  . Diabetes ([place]) 04/26/2015  . COPD exacerbation ([place]) 04/26/2015  . Epistaxis 04/05/2015  . AAA (abdominal aortic aneurysm) without rupture ([place]) 01/18/2015  . PAD (peripheral artery disease) ([place]) 01/18/2015  . Myalgia and myositis 07/02/2014  . Renal insufficiency 04/20/2014  . Hereditary and idiopathic peripheral neuropathy 10/29/2012  . Hypertension 05/10/2011  . Bradycardia 05/10/2011  . CAD (coronary artery disease) 09/05/2010  . TINNITUS 04/04/2010  . CORONARY ARTERY BYPASS GRAFT, HX OF 03/31/2010  . SLEEP DISORDER 12/28/2008  . Cervicalgia 07/26/2008  . POLYP, COLON 07/16/2007  . Hyperlipidemia 09/24/2006  . History of gout 09/23/2006    Current Outpatient Prescriptions on File Prior to Visit  Medication Sig Dispense Refill  . ACCU-CHEK COMPACT STRIPS test strip TEST BLOOD SUGAR ONCE DAILY 100 each 1  . albuterol (PROVENTIL HFA;VENTOLIN HFA) 108 (90 Base) MCG/ACT inhaler Inhale 2  puffs into the lungs every 6 (six) hours as needed for wheezing or shortness of breath.    . Aspirin (ADULT ASPIRIN LOW STRENGTH) 81 MG EC tablet Take 81 mg by mouth daily.      . capsaicin (ZOSTRIX) 0.025 % cream Apply 1 application topically as needed (for irritation).     . cetirizine (ZYRTEC) 10 MG tablet Take 10 mg by mouth daily as needed.     . cholecalciferol (VITAMIN D) 1000 UNITS tablet Take 2,000 Units by mouth daily.     . enalapril (VASOTEC) 10 MG tablet Take 1.5 tablets (15 mg total) by mouth daily. 135 tablet 3  . Magnesium Oxide 420 MG TABS Take 420 mg by mouth daily.     . metoprolol succinate (TOPROL XL) 25 MG 24 hr tablet Take 0.5 tablets (12.5 mg total) by mouth daily. 45 tablet 3  . Multiple Vitamins-Minerals (MULTIVITAL PO) Take 1 tablet by mouth daily.     . temazepam (RESTORIL) 7.5 MG capsule Take 1 capsule (7.5 mg total) by mouth at bedtime as needed. 90 capsule 0  . triamcinolone (NASACORT AQ) 55 MCG/ACT AERO nasal inhaler Place 2 sprays into the nose daily. 1 Inhaler 12   No current facility-administered medications on file prior to visit.     Past Medical History:  Diagnosis Date  . AAA (abdominal aortic aneurysm) ([place])   . Asthma 1976  . Bronchiolitis May 2016  . CAD (coronary artery  disease)    NSTEMI 02/2010;  s/p CABG 1/12 (L-LAD, SOM1, S-PDA); echo in 08/2010: Mild LVH, EF 69%, grade 2 diastolic dysfunction, mild MR, mild LAE, mildly reduced RV function, mild RAE, PASP 42  . Colon polyps   . COPD (chronic obstructive pulmonary disease) ([place])   . Diabetes mellitus 2000   adult onset  . ED (erectile dysfunction)   . Gout   . HTN (hypertension)   . Hyperlipidemia   . Macular degeneration   . Myocardial infarction ([place]) 1 -13-2012  . Neuropathy [facility])     Past Surgical History:  Procedure Laterality Date  . 3 vessel CABG  03/03/2010  . cataract left eye    . COLONOSCOPY     neg 2009  . COLONOSCOPY  2014   5 mm sessile polyp; AVM  . CORONARY ARTERY  BYPASS GRAFT  2012   X3  . EYE SURGERY    . INCISION AND DRAINAGE PERIRECTAL ABSCESS    . INGUINAL HERNIA REPAIR      Social History   Social History  . Marital status: Widowed    Spouse name: N/A  . Number of children: N/A  . Years of education: N/A   Occupational History  . RETIRED Retired   Social History Main Topics  . Smoking status: Former Smoker    Types: Cigarettes    Quit date: 02/20/2003  . Smokeless tobacco: Never Used  . Alcohol use 3.6 oz/week    3 Glasses of wine, 3 Shots of liquor per week     Comment:  1-3 drinks a week  . Drug use: No  . Sexual activity: Not on file   Other Topics Concern  . Not on file   Social History Narrative   Quit smoking in 1998. 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     Family History  Problem Relation Age of Onset  . Heart failure Mother   . Heart disease Mother     CHF  . Heart attack Mother   . Hypertension Mother   . Heart attack Father   . Heart disease Father 64    MI  . Hypertension Father   . Heart attack Sister 83  . Heart disease Sister 107    MI  . Diabetes Sister   . Hyperlipidemia Sister   . Hypertension Sister   . Varicose Veins Sister   . Heart attack Brother 29  . Diabetes Brother   . Heart disease Brother 66    MI  . Cancer Brother   . Hypertension Brother   . Deep vein thrombosis Daughter   . Other Son     varicose veins  . Colon cancer Neg Hx     Review of Systems  Constitutional: Negative for fever.  Respiratory: Shortness of breath: related to asthma.   Cardiovascular: Negative for chest pain, palpitations and leg swelling.  Gastrointestinal: Negative for abdominal pain and nausea.  Musculoskeletal: Negative for back pain.  Neurological: Negative for light-headedness, numbness and headaches.       Sharp pain in feet from neuropathy       Objective:   Vitals:   09/20/15 1112  BP: (!) 172/84  Pulse: (!) 56  Resp: 16  Temp: 97.2 F (36.2 C)   Filed Weights   09/20/15  1112  Weight: 176 lb (79.8 kg)   Body mass index is 24.55 kg/m.   Physical Exam Constitutional: Appears well-developed and well-nourished. No distress.  HENT:  Head: Normocephalic and atraumatic.  Neck: Neck supple. No tracheal deviation present. No thyromegaly present.  Cardiovascular: Normal rate, regular rhythm and normal heart sounds.   No murmur heard. No carotid bruit  Pulmonary/Chest: Effort normal and breath sounds normal. No respiratory distress. No has no wheezes. No rales.  Musculoskeletal: No edema.  Lymphadenopathy: No cervical adenopathy.  Skin: Skin is warm and dry. Not diaphoretic.  Psychiatric: Normal mood and affect. Behavior is normal.         Assessment & Plan:   See Problem List for Assessment and Plan of chronic medical problems.

## 2015-09-21 ENCOUNTER — Encounter: Payer: Self-pay | Admitting: Internal Medicine

## 2015-11-08 ENCOUNTER — Ambulatory Visit (INDEPENDENT_AMBULATORY_CARE_PROVIDER_SITE_OTHER): Payer: Medicare Other

## 2015-11-08 DIAGNOSIS — Z23 Encounter for immunization: Secondary | ICD-10-CM

## 2015-11-18 ENCOUNTER — Encounter: Payer: Self-pay | Admitting: Internal Medicine

## 2015-11-18 DIAGNOSIS — E119 Type 2 diabetes mellitus without complications: Secondary | ICD-10-CM | POA: Diagnosis not present

## 2015-11-18 DIAGNOSIS — H11003 Unspecified pterygium of eye, bilateral: Secondary | ICD-10-CM | POA: Diagnosis not present

## 2015-11-18 DIAGNOSIS — H524 Presbyopia: Secondary | ICD-10-CM | POA: Diagnosis not present

## 2015-11-18 DIAGNOSIS — Z961 Presence of intraocular lens: Secondary | ICD-10-CM | POA: Diagnosis not present

## 2015-11-18 LAB — HM DIABETES EYE EXAM

## 2015-12-19 ENCOUNTER — Telehealth: Payer: Self-pay | Admitting: *Deleted

## 2015-12-19 MED ORDER — TEMAZEPAM 7.5 MG PO CAPS: 7.5000 mg | ORAL_CAPSULE | Freq: Every evening | ORAL | 0 refills | Status: DC | PRN

## 2015-12-19 NOTE — Telephone Encounter (Signed)
Printed script & faxed to [facility].Marland KitchenMarland KitchenLMB

## 2015-12-19 NOTE — Telephone Encounter (Signed)
Rec'd fax pt requesting refills on his Temazepam 90 day supply...Brian [name]

## 2015-12-19 NOTE — Telephone Encounter (Signed)
Ok to fill 

## 2016-01-02 ENCOUNTER — Other Ambulatory Visit: Payer: Self-pay | Admitting: *Deleted

## 2016-01-02 DIAGNOSIS — I728 Aneurysm of other specified arteries: Secondary | ICD-10-CM

## 2016-01-03 ENCOUNTER — Other Ambulatory Visit: Payer: Self-pay | Admitting: *Deleted

## 2016-01-03 DIAGNOSIS — I714 Abdominal aortic aneurysm, without rupture, unspecified: Secondary | ICD-10-CM

## 2016-01-05 ENCOUNTER — Encounter: Payer: Self-pay | Admitting: Vascular Surgery

## 2016-01-06 ENCOUNTER — Encounter: Payer: Self-pay | Admitting: Vascular Surgery

## 2016-01-06 ENCOUNTER — Ambulatory Visit (INDEPENDENT_AMBULATORY_CARE_PROVIDER_SITE_OTHER): Payer: Medicare Other | Admitting: Vascular Surgery

## 2016-01-06 ENCOUNTER — Ambulatory Visit (HOSPITAL_COMMUNITY)
Admission: RE | Admit: 2016-01-06 | Discharge: 2016-01-06 | Disposition: A | Discharge status: Home / Self Care | Payer: Medicare Other | Source: Ambulatory Visit | Attending: Vascular Surgery | Admitting: Vascular Surgery

## 2016-01-06 ENCOUNTER — Other Ambulatory Visit: Payer: Self-pay

## 2016-01-06 VITALS — BP 155/74 | HR 51 | Temp 97.2°F | Resp 18 | Ht 71.0 in | Wt 175.2 lb

## 2016-01-06 DIAGNOSIS — I714 Abdominal aortic aneurysm, without rupture, unspecified: Secondary | ICD-10-CM

## 2016-01-06 NOTE — Progress Notes (Signed)
Patient ID: Brian [name], male   DOB: [birth year], 82 y.o.   MRN: [id number]  Reason for Consult: Re-evaluation (6 month f/u AAA)   Referred by Binnie Rail, MD  Subjective:     HPI:  Brian [name] is a 82 y.o. male for follow-up of abdominal aortic aneurysm. He was previously noted to be 4.8 cm and in reviewing his CT scan has 2 left renal arteries one of which would render him very short neck of his aneurysm. He has no new back or abdominal pain. He continues to walk daily. He controls his diabetes with diet. He has history of CAD status post CABG 2012. He is a former smoker having quit 12 years ago. He does take aspirin and statin daily. He has no limitations to his walking at this time.  Past Medical History:  Diagnosis Date  . AAA (abdominal aortic aneurysm) ([place])   . Asthma 1976  . Bronchiolitis May 2016  . CAD (coronary artery disease)    NSTEMI 02/2010;  s/p CABG 1/12 (L-LAD, SOM1, S-PDA); echo in 08/2010: Mild LVH, EF 51%, grade 2 diastolic dysfunction, mild MR, mild LAE, mildly reduced RV function, mild RAE, PASP 42  . Colon polyps   . COPD (chronic obstructive pulmonary disease) ([place])   . Diabetes mellitus 2000   adult onset  . ED (erectile dysfunction)   . Gout   . HTN (hypertension)   . Hyperlipidemia   . Macular degeneration   . Myocardial infarction 1 -13-2012  . Neuropathy ([place])    Family History  Problem Relation Age of Onset  . Heart failure Mother   . Heart disease Mother     CHF  . Heart attack Mother   . Hypertension Mother   . Heart attack Father   . Heart disease Father 7    MI  . Hypertension Father   . Heart attack Sister 60  . Heart disease Sister 36    MI  . Diabetes Sister   . Hyperlipidemia Sister   . Hypertension Sister   . Varicose Veins Sister   . Heart attack Brother 43  . Diabetes Brother   . Heart disease Brother 22    MI  . Cancer Brother   . Hypertension Brother   . Deep vein thrombosis Daughter   . Other Son    varicose veins  . Colon cancer Neg Hx    Past Surgical History:  Procedure Laterality Date  . 3 vessel CABG  03/03/2010  . cataract left eye    . COLONOSCOPY     neg 2009  . COLONOSCOPY  2014   5 mm sessile polyp; AVM  . CORONARY ARTERY BYPASS GRAFT  2012   X3  . EYE SURGERY    . INCISION AND DRAINAGE PERIRECTAL ABSCESS    . INGUINAL HERNIA REPAIR      Short Social History:  Social History  Substance Use Topics  . Smoking status: Former Smoker    Types: Cigarettes    Quit date: 02/20/2003  . Smokeless tobacco: Never Used  . Alcohol use 3.6 oz/week    3 Glasses of wine, 3 Shots of liquor per week     Comment:  1-3 drinks a week    Allergies  Allergen Reactions  . Meperidine Hcl     Tongue swelling Because of a history of documented adverse serious drug reaction;Medi Alert bracelet  is recommended  . Zolpidem Tartrate     REACTION: Crazy dreams  Current Outpatient Prescriptions  Medication Sig Dispense Refill  . ACCU-CHEK COMPACT STRIPS test strip TEST BLOOD SUGAR ONCE DAILY 100 each 1  . albuterol (PROVENTIL HFA;VENTOLIN HFA) 108 (90 Base) MCG/ACT inhaler Inhale 4 puffs into the lungs every 6 (six) hours as needed for wheezing or shortness of breath.     . Aspirin (ADULT ASPIRIN LOW STRENGTH) 81 MG EC tablet Take 81 mg by mouth daily.      . capsaicin (ZOSTRIX) 0.025 % cream Apply 1 application topically as needed (for irritation).     . cholecalciferol (VITAMIN D) 1000 UNITS tablet Take 3,000 Units by mouth daily.     . enalapril (VASOTEC) 10 MG tablet Take 1.5 tablets (15 mg total) by mouth daily. 135 tablet 3  . Magnesium Oxide 420 MG TABS Take 420 mg by mouth daily.     . metoprolol succinate (TOPROL XL) 25 MG 24 hr tablet Take 0.5 tablets (12.5 mg total) by mouth daily. 45 tablet 3  . Multiple Vitamins-Minerals (MULTIVITAL PO) Take 1 tablet by mouth daily.     . temazepam (RESTORIL) 7.5 MG capsule Take 1 capsule (7.5 mg total) by mouth at bedtime as needed. 90  capsule 0  . triamcinolone (NASACORT AQ) 55 MCG/ACT AERO nasal inhaler Place 2 sprays into the nose daily. 1 Inhaler 12  . cetirizine (ZYRTEC) 10 MG tablet Take 10 mg by mouth daily as needed.      No current facility-administered medications for this visit.     Review of Systems  Constitutional:  Constitutional negative. Eyes: Eyes negative.  Respiratory: Respiratory negative.  Cardiovascular: Cardiovascular negative.  GI: Gastrointestinal negative.  GU: Genitourinary negative. Musculoskeletal: Musculoskeletal negative.  Skin: Skin negative.  Neurological: Neurological negative. Hematologic: Hematologic/lymphatic negative.  Psychiatric: Psychiatric negative.        Objective:  Objective   Vitals:   01/06/16 0925 01/06/16 0929  BP: (!) 164/70 (!) 155/74  Pulse: (!) 51   Resp: 18   Temp: 97.2 F (36.2 C)   TempSrc: Oral   SpO2: 98%   Weight: 175 lb 3.2 oz (79.5 kg)   Height: '5\' 11"'$  (1.803 m)    Body mass index is 24.44 kg/m.  Physical Exam  Constitutional: He is oriented to person, place, and time. He appears well-nourished.  HENT:  Head: Atraumatic.  Eyes: EOM are normal.  Neck: Normal range of motion.  Cardiovascular: Normal rate.   Pulmonary/Chest: Effort normal.  Abdominal: Soft. He exhibits mass. There is no tenderness. There is no rebound.  Musculoskeletal: Normal range of motion. He exhibits no edema.  Neurological: He is alert and oriented to person, place, and time. No cranial nerve deficit.  Skin: Skin is warm and dry.    Data: Max aortic diameter is 5.0 cm today. This is compared to 4.8 cm previously.     Assessment/Plan:    82 year old male history of abdominal aortic aneurysm. This is noted be very slow growing at this time and I told him that I expect at some point he could have change that would necessitate CT scan for further investigation of endovascular repair. In reviewing his old CT scans he does have a very short neck 2 left renal  arteries and would probably need to be a candidate for a Zenith fenestrated device at this time. We will follow him at 6 month interval with a repeat ultrasound. If there are no have told him should he have pain in his back or abdomen that is new in onset he  should be seen on an urgent basis. He demonstrates good understanding follow-up in 6 months.     Waynetta Sandy MD [facility]

## 2016-02-09 ENCOUNTER — Encounter: Payer: Self-pay | Admitting: Podiatry

## 2016-02-09 ENCOUNTER — Ambulatory Visit (INDEPENDENT_AMBULATORY_CARE_PROVIDER_SITE_OTHER): Payer: Medicare Other | Admitting: Podiatry

## 2016-02-09 DIAGNOSIS — E1140 Type 2 diabetes mellitus with diabetic neuropathy, unspecified: Secondary | ICD-10-CM

## 2016-02-09 DIAGNOSIS — B351 Tinea unguium: Secondary | ICD-10-CM | POA: Diagnosis not present

## 2016-02-09 NOTE — Progress Notes (Signed)
He presents today with a chief complaint of painful elongated toenails. States that his diabetes is under good control that he does get some numbness and tingling in his toes.  Objective: Vital signs are stable alert and oriented 3. Pulses are palpable. Neurologic sensorium is intact. Deep tendon reflexes are intact muscle strength is normal bilateral. Toenails are thick yellow dystrophic and mycotic and painful on palpation as well as debridement. No open lesions or wounds are noted.  Assessment: Pain in limb secondary to onychomycosis.  Plan: Debridement of toenails 1 through 5 bilateral.

## 2016-03-06 ENCOUNTER — Ambulatory Visit: Payer: Medicare Other | Admitting: Internal Medicine

## 2016-03-16 LAB — HEPATIC FUNCTION PANEL
ALK PHOS: 78 U/L (ref 25–125)
ALT: 21 U/L (ref 10–40)
AST: 16 U/L (ref 14–40)
BILIRUBIN DIRECT: 0.1 mg/dL (ref 0.01–0.4)
Bilirubin, Total: 0.5 mg/dL

## 2016-03-16 LAB — LIPID PANEL
Cholesterol: 155 mg/dL (ref 0–200)
HDL: 76 mg/dL — AB (ref 35–70)
LDL CALC: 68 mg/dL
Triglycerides: 55 mg/dL (ref 40–160)

## 2016-03-16 LAB — BASIC METABOLIC PANEL
Creatinine: 1.5 mg/dL — AB (ref 0.6–1.3)
Potassium: 4.8 mmol/L (ref 3.4–5.3)
Sodium: 143 mmol/L (ref 137–147)

## 2016-03-28 ENCOUNTER — Ambulatory Visit (INDEPENDENT_AMBULATORY_CARE_PROVIDER_SITE_OTHER): Payer: Medicare Other | Admitting: Internal Medicine

## 2016-03-28 ENCOUNTER — Encounter: Payer: Self-pay | Admitting: Internal Medicine

## 2016-03-28 VITALS — BP 150/66 | HR 52 | Temp 97.6°F | Resp 18 | Wt 176.0 lb

## 2016-03-28 DIAGNOSIS — I2510 Atherosclerotic heart disease of native coronary artery without angina pectoris: Secondary | ICD-10-CM

## 2016-03-28 DIAGNOSIS — J439 Emphysema, unspecified: Secondary | ICD-10-CM

## 2016-03-28 DIAGNOSIS — G479 Sleep disorder, unspecified: Secondary | ICD-10-CM

## 2016-03-28 DIAGNOSIS — N183 Chronic kidney disease, stage 3 unspecified: Secondary | ICD-10-CM

## 2016-03-28 DIAGNOSIS — N182 Chronic kidney disease, stage 2 (mild): Secondary | ICD-10-CM

## 2016-03-28 DIAGNOSIS — R040 Epistaxis: Secondary | ICD-10-CM

## 2016-03-28 DIAGNOSIS — E7800 Pure hypercholesterolemia, unspecified: Secondary | ICD-10-CM | POA: Diagnosis not present

## 2016-03-28 DIAGNOSIS — I10 Essential (primary) hypertension: Secondary | ICD-10-CM

## 2016-03-28 DIAGNOSIS — E1122 Type 2 diabetes mellitus with diabetic chronic kidney disease: Secondary | ICD-10-CM

## 2016-03-28 MED ORDER — TEMAZEPAM 7.5 MG PO CAPS: 7.5000 mg | ORAL_CAPSULE | Freq: Every evening | ORAL | 1 refills | Status: DC | PRN

## 2016-03-28 MED ORDER — METOPROLOL SUCCINATE ER 25 MG PO TB24: 12.5000 mg | ORAL_TABLET | Freq: Every day | ORAL | 3 refills | Status: DC

## 2016-03-28 MED ORDER — ENALAPRIL MALEATE 10 MG PO TABS: 15.0000 mg | ORAL_TABLET | Freq: Every day | ORAL | 3 refills | Status: DC

## 2016-03-28 MED ORDER — SIMVASTATIN 40 MG PO TABS: 40.0000 mg | ORAL_TABLET | Freq: Every day | ORAL | 3 refills | Status: DC

## 2016-03-28 NOTE — Assessment & Plan Note (Signed)
Related to Nasacort, which he has stopped Saline nasal sprays Humidifier Can apply Vaseline to nasal passageway if needed

## 2016-03-28 NOTE — Assessment & Plan Note (Signed)
No chest pain, palps Continue ASA, BB and statin Continue regular exercise

## 2016-03-28 NOTE — Assessment & Plan Note (Signed)
Reviewed lipid panel  - controlled Continue daily statin Regular exercise and healthy diet encouraged

## 2016-03-28 NOTE — Assessment & Plan Note (Signed)
BP well controlled at home, slightly elevated here Stressed good BP control Current regimen effective and well tolerated Continue current medications at current doses He brought blood work from the [place] - reviewed

## 2016-03-28 NOTE — Assessment & Plan Note (Signed)
Recent a1c 6.2% Eye exams up to date Exercising regularly Continue diabetic diet

## 2016-03-28 NOTE — Assessment & Plan Note (Signed)
Has SOB and wheeze with certain activities - not with walking Uses albuterol as needed, which works well Discussed daily long acting inhaler - he deferred trying it at this time

## 2016-03-28 NOTE — Assessment & Plan Note (Signed)
Stable  Cr 1.5

## 2016-03-28 NOTE — Patient Instructions (Signed)
   Medications reviewed and updated.  No changes recommended at this time.  Your prescription(s) have been submitted to your pharmacy. Please take as directed and contact our office if you believe you are having problem(s) with the medication(s).    Please followup in 6 months   

## 2016-03-28 NOTE — Progress Notes (Signed)
Subjective:    Patient ID: Brian [name], male    DOB: [birth year], 83 y.o.   MRN: [id number]  HPI The patient is here for follow up.  CAD, Hypertension: He is taking his medication daily. He is compliant with a low sodium diet.  He denies chest pain, palpitations, edema, shortness of breath and regular headaches. He is exercising regularly.  He does monitor his blood pressure at home - 138/67 at home today.    Hyperlipidemia: He is taking his medication daily. He is compliant with a low fat/cholesterol diet. He is exercising regularly. He denies myalgias.   COPD:  He uses the albuterol every morning as needed.  He denies symptoms when walking - he will have SOB and wheeze when doing house work.   Sleep disorder:  He takes the restoril as needed.  He sometimes sleeps without the medication.  The medication works well and he denies feeling groggy in the morning.   Diabetes: He is controlling his sugars with diet.  He is compliant with a diabetic diet. He is exercising regularly  Walks 3-4 times a week.  He checks his feet daily and denies foot lesions. He is up-to-date with an ophthalmology examination.    Medications and allergies reviewed with patient and updated if appropriate.  Patient Active Problem List   Diagnosis Date Noted  . Nocturnal leg cramps 09/20/2015  . Nausea vomiting and diarrhea   . COPD (chronic obstructive pulmonary disease) ([place]) 05/31/2015  . CKD (chronic kidney disease) stage 3, GFR 30-59 ml/min 05/31/2015  . Gastroenteritis 05/31/2015  . Diabetes ([place]) 04/26/2015  . Epistaxis 04/05/2015  . AAA (abdominal aortic aneurysm) without rupture ([place]) 01/18/2015  . PAD (peripheral artery disease) ([place]) 01/18/2015  . Myalgia and myositis 07/02/2014  . Hereditary and idiopathic peripheral neuropathy 10/29/2012  . Hypertension 05/10/2011  . Bradycardia 05/10/2011  . CAD (coronary artery disease) 09/05/2010  . TINNITUS 04/04/2010  . CORONARY ARTERY BYPASS GRAFT,  HX OF 03/31/2010  . SLEEP DISORDER 12/28/2008  . Cervicalgia 07/26/2008  . POLYP, COLON 07/16/2007  . Hyperlipidemia 09/24/2006  . History of gout 09/23/2006    Current Outpatient Prescriptions on File Prior to Visit  Medication Sig Dispense Refill  . ACCU-CHEK COMPACT STRIPS test strip TEST BLOOD SUGAR ONCE DAILY 100 each 1  . albuterol (PROVENTIL HFA;VENTOLIN HFA) 108 (90 Base) MCG/ACT inhaler Inhale 4 puffs into the lungs every 6 (six) hours as needed for wheezing or shortness of breath.     . Aspirin (ADULT ASPIRIN LOW STRENGTH) 81 MG EC tablet Take 81 mg by mouth daily.      . capsaicin (ZOSTRIX) 0.025 % cream Apply 1 application topically as needed (for irritation).     . cetirizine (ZYRTEC) 10 MG tablet Take 10 mg by mouth daily as needed.     . cholecalciferol (VITAMIN D) 1000 UNITS tablet Take 3,000 Units by mouth daily.     . enalapril (VASOTEC) 10 MG tablet Take 1.5 tablets (15 mg total) by mouth daily. 135 tablet 3  . Magnesium Oxide 420 MG TABS Take 420 mg by mouth daily.     . metoprolol succinate (TOPROL XL) 25 MG 24 hr tablet Take 0.5 tablets (12.5 mg total) by mouth daily. 45 tablet 3  . Multiple Vitamins-Minerals (MULTIVITAL PO) Take 1 tablet by mouth daily.     . temazepam (RESTORIL) 7.5 MG capsule Take 1 capsule (7.5 mg total) by mouth at bedtime as needed. 90 capsule 0  . triamcinolone (  NASACORT AQ) 55 MCG/ACT AERO nasal inhaler Place 2 sprays into the nose daily. 1 Inhaler 12   No current facility-administered medications on file prior to visit.     Past Medical History:  Diagnosis Date  . AAA (abdominal aortic aneurysm) ([place])   . Asthma 1976  . Bronchiolitis May 2016  . CAD (coronary artery disease)    NSTEMI 02/2010;  s/p CABG 1/12 (L-LAD, SOM1, S-PDA); echo in 08/2010: Mild LVH, EF 67%, grade 2 diastolic dysfunction, mild MR, mild LAE, mildly reduced RV function, mild RAE, PASP 42  . Colon polyps   . COPD (chronic obstructive pulmonary disease) ([place])   .  Diabetes mellitus 2000   adult onset  . ED (erectile dysfunction)   . Gout   . HTN (hypertension)   . Hyperlipidemia   . Macular degeneration   . Myocardial infarction 1 -13-2012  . Neuropathy [facility])     Past Surgical History:  Procedure Laterality Date  . 3 vessel CABG  03/03/2010  . cataract left eye    . COLONOSCOPY     neg 2009  . COLONOSCOPY  2014   5 mm sessile polyp; AVM  . CORONARY ARTERY BYPASS GRAFT  2012   X3  . EYE SURGERY    . INCISION AND DRAINAGE PERIRECTAL ABSCESS    . INGUINAL HERNIA REPAIR      Social History   Social History  . Marital status: Widowed    Spouse name: N/A  . Number of children: N/A  . Years of education: N/A   Occupational History  . RETIRED Retired   Social History Main Topics  . Smoking status: Former Smoker    Types: Cigarettes    Quit date: 02/20/2003  . Smokeless tobacco: Never Used  . Alcohol use 3.6 oz/week    3 Glasses of wine, 3 Shots of liquor per week     Comment:  1-3 drinks a week  . Drug use: No  . Sexual activity: Not on file   Other Topics Concern  . Not on file   Social History Narrative   Quit smoking in 1998. 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     Family History  Problem Relation Age of Onset  . Heart failure Mother   . Heart disease Mother     CHF  . Heart attack Mother   . Hypertension Mother   . Heart attack Father   . Heart disease Father 41    MI  . Hypertension Father   . Heart attack Sister 79  . Heart disease Sister 35    MI  . Diabetes Sister   . Hyperlipidemia Sister   . Hypertension Sister   . Varicose Veins Sister   . Heart attack Brother 25  . Diabetes Brother   . Heart disease Brother 94    MI  . Cancer Brother   . Hypertension Brother   . Deep vein thrombosis Daughter   . Other Son     varicose veins  . Colon cancer Neg Hx     Review of Systems  Constitutional: Negative for fever.  Respiratory: Positive for shortness of breath and wheezing. Negative for cough.    Cardiovascular: Negative for chest pain, palpitations and leg swelling.  Gastrointestinal: Negative for abdominal pain.  Neurological: Negative for light-headedness and headaches.       Objective:   Vitals:   03/28/16 0855  BP: (!) 150/66  Pulse: (!) 52  Resp: 18  Temp: 97.6 F (36.4  C)   Wt Readings from Last 3 Encounters:  03/28/16 176 lb (79.8 kg)  01/06/16 175 lb 3.2 oz (79.5 kg)  09/20/15 176 lb (79.8 kg)   Body mass index is 24.55 kg/m.   Physical Exam    Constitutional: Appears well-developed and well-nourished. No distress.  HENT:  Head: Normocephalic and atraumatic.  Neck: Neck supple. No tracheal deviation present. No thyromegaly present.  No cervical lymphadenopathy Cardiovascular: Normal rate, regular rhythm and normal heart sounds.   No murmur heard. No carotid bruit .  No edema Pulmonary/Chest: Effort normal and breath sounds normal. No respiratory distress. No has no wheezes. No rales.  Skin: Skin is warm and dry. Not diaphoretic.  Psychiatric: Normal mood and affect. Behavior is normal.      Assessment & Plan:    See Problem List for Assessment and Plan of chronic medical problems.   FU in 6 months

## 2016-03-28 NOTE — Assessment & Plan Note (Signed)
Takes restoril as needed Overall controlled

## 2016-04-09 ENCOUNTER — Encounter: Payer: Self-pay | Admitting: Internal Medicine

## 2016-04-09 LAB — CALCIUM: Calcium: 9 mg/dL

## 2016-06-21 ENCOUNTER — Encounter: Payer: Self-pay | Admitting: Vascular Surgery

## 2016-06-29 ENCOUNTER — Ambulatory Visit (HOSPITAL_COMMUNITY)
Admission: RE | Admit: 2016-06-29 | Discharge: 2016-06-29 | Disposition: A | Discharge status: Home / Self Care | Payer: Medicare Other | Source: Ambulatory Visit | Attending: Vascular Surgery | Admitting: Vascular Surgery

## 2016-06-29 ENCOUNTER — Encounter: Payer: Self-pay | Admitting: Vascular Surgery

## 2016-06-29 ENCOUNTER — Ambulatory Visit (INDEPENDENT_AMBULATORY_CARE_PROVIDER_SITE_OTHER): Payer: Medicare Other | Admitting: Vascular Surgery

## 2016-06-29 VITALS — BP 124/62 | HR 58 | Temp 96.8°F | Ht 71.0 in | Wt 171.4 lb

## 2016-06-29 DIAGNOSIS — L97929 Non-pressure chronic ulcer of unspecified part of left lower leg with unspecified severity: Secondary | ICD-10-CM | POA: Diagnosis not present

## 2016-06-29 DIAGNOSIS — I714 Abdominal aortic aneurysm, without rupture, unspecified: Secondary | ICD-10-CM

## 2016-06-29 DIAGNOSIS — I83029 Varicose veins of left lower extremity with ulcer of unspecified site: Secondary | ICD-10-CM | POA: Diagnosis not present

## 2016-06-29 NOTE — Progress Notes (Signed)
Patient ID: [name], male   DOB: [birth year], 83 y.o.   MRN: [id number]  Reason for Consult: Aneurysm and Varicose Veins   Referred by Binnie Rail, MD  Subjective:     HPI:  GABRIELLE MESTER is a 83 y.o. male with history of diabetes and coronary artery disease and former smoking. He has known abdominal aortic aneurysm and has been followed with CT scan from last year for the aneurysm was 4.7 cm. He follows up today with abdominal aortic duplex. He is not having any abdominal pain or back pain. He continues to walk with no limitations. He does complain of varicosities in his left lower extremity on the anterior leg to give him discomfort and have bled on occasion. He however does not want these repaired this time has not had any recent bleeding. He does take aspirin on a daily basis.  Past Medical History:  Diagnosis Date  . AAA (abdominal aortic aneurysm) ([place])   . Asthma 1976  . Bronchiolitis May 2016  . CAD (coronary artery disease)    NSTEMI 02/2010;  s/p CABG 1/12 (L-LAD, SOM1, S-PDA); echo in 08/2010: Mild LVH, EF 28%, grade 2 diastolic dysfunction, mild MR, mild LAE, mildly reduced RV function, mild RAE, PASP 42  . Colon polyps   . COPD (chronic obstructive pulmonary disease) ([place])   . Diabetes mellitus 2000   adult onset  . ED (erectile dysfunction)   . Gout   . HTN (hypertension)   . Hyperlipidemia   . Macular degeneration   . Myocardial infarction ([place]) 1 -13-2012  . Neuropathy    Family History  Problem Relation Age of Onset  . Heart failure Mother   . Heart disease Mother        CHF  . Heart attack Mother   . Hypertension Mother   . Heart attack Father   . Heart disease Father 42       MI  . Hypertension Father   . Heart attack Sister 14  . Heart disease Sister 53       MI  . Diabetes Sister   . Hyperlipidemia Sister   . Hypertension Sister   . Varicose Veins Sister   . Heart attack Brother 12  . Diabetes Brother   . Heart disease Brother 29       MI  . Cancer Brother   . Hypertension Brother   . Deep vein thrombosis Daughter   . Other Son        varicose veins  . Colon cancer Neg Hx    Past Surgical History:  Procedure Laterality Date  . 3 vessel CABG  03/03/2010  . cataract left eye    . COLONOSCOPY     neg 2009  . COLONOSCOPY  2014   5 mm sessile polyp; AVM  . CORONARY ARTERY BYPASS GRAFT  2012   X3  . EYE SURGERY    . INCISION AND DRAINAGE PERIRECTAL ABSCESS    . INGUINAL HERNIA REPAIR      Short Social History:  Social History  Substance Use Topics  . Smoking status: Former Smoker    Types: Cigarettes    Quit date: 02/20/2003  . Smokeless tobacco: Never Used  . Alcohol use 3.6 oz/week    3 Glasses of wine, 3 Shots of liquor per week     Comment:  1-3 drinks a week    Allergies  Allergen Reactions  . Meperidine Hcl     Tongue swelling  Because of a history of documented adverse serious drug reaction;Medi Alert bracelet  is recommended  . Zolpidem Tartrate     REACTION: Crazy dreams    Current Outpatient Prescriptions  Medication Sig Dispense Refill  . ACCU-CHEK COMPACT STRIPS test strip TEST BLOOD SUGAR ONCE DAILY 100 each 1  . albuterol (PROVENTIL HFA;VENTOLIN HFA) 108 (90 Base) MCG/ACT inhaler Inhale 4 puffs into the lungs every 6 (six) hours as needed for wheezing or shortness of breath.     . Aspirin (ADULT ASPIRIN LOW STRENGTH) 81 MG EC tablet Take 81 mg by mouth daily.      . capsaicin (ZOSTRIX) 0.025 % cream Apply 1 application topically as needed (for irritation).     . cetirizine (ZYRTEC) 10 MG tablet Take 10 mg by mouth daily as needed.     . cholecalciferol (VITAMIN D) 1000 UNITS tablet Take 3,000 Units by mouth daily.     . enalapril (VASOTEC) 10 MG tablet Take 1.5 tablets (15 mg total) by mouth daily. 135 tablet 3  . Magnesium Oxide 420 MG TABS Take 420 mg by mouth daily.     . metoprolol succinate (TOPROL XL) 25 MG 24 hr tablet Take 0.5 tablets (12.5 mg total) by mouth daily. 45 tablet 3   . Multiple Vitamins-Minerals (MULTIVITAL PO) Take 1 tablet by mouth daily.     . simvastatin (ZOCOR) 40 MG tablet Take 1 tablet (40 mg total) by mouth daily. 90 tablet 3  . temazepam (RESTORIL) 7.5 MG capsule Take 1 capsule (7.5 mg total) by mouth at bedtime as needed. 90 capsule 1  . triamcinolone (NASACORT AQ) 55 MCG/ACT AERO nasal inhaler Place 2 sprays into the nose daily. 1 Inhaler 12   No current facility-administered medications for this visit.     Review of Systems  Constitutional:  Constitutional negative. HENT: HENT negative.  Eyes: Eyes negative.  Respiratory: Respiratory negative.  Cardiovascular: Cardiovascular negative.  GI: Gastrointestinal negative.  Musculoskeletal: Positive for leg pain.  Skin: Skin negative.  Neurological: Neurological negative. Hematologic: Hematologic/lymphatic negative.  Psychiatric: Psychiatric negative.        Objective:  Objective   Vitals:   06/29/16 0908  BP: 124/62  Pulse: (!) 58  Temp: (!) 96.8 F (36 C)  SpO2: 96%  Weight: 171 lb 6.4 oz (77.7 kg)  Height: '5\' 11"'$  (1.803 m)   Body mass index is 23.91 kg/m.  Physical Exam  Constitutional: He is oriented to person, place, and time. He appears well-developed.  HENT:  Head: Normocephalic.  Neck: Normal range of motion.  Cardiovascular: Normal rate.   Pulses:      Radial pulses are 2+ on the right side, and 2+ on the left side.       Femoral pulses are 2+ on the right side, and 2+ on the left side.      Popliteal pulses are 2+ on the right side, and 2+ on the left side.  Pulmonary/Chest: Effort normal.  Abdominal: Soft. He exhibits mass.  Musculoskeletal: Normal range of motion. He exhibits no edema.  Anterior left leg varicosities and spider vein of bilateral ankles left > right  Lymphadenopathy:    He has no cervical adenopathy.  Neurological: He is alert and oriented to person, place, and time.  Skin: Skin is warm and dry.  Psychiatric: He has a normal mood and  affect. His behavior is normal. Judgment and thought content normal.    Data: I have independently interpreted his aortic duplex demonstrating distal aorta 4.7 cm  there is no significant change when compared to his CT scan     Assessment/Plan:     83 year old male with known abdominal aortic aneurysm with a very short neck bilateral CT scan. He has duplex demonstrating stable size at this time and will follow him up in 1 year with repeat abdominal aortic duplex. I discussed with him that should he need repair he would need a CT scan prior. He also has complaints of varicose veins but does not want intervention today but we will get reflux study prior to his follow-up in 1 year. I discussed with him the signs and symptoms of aortic aneurysm rupture and told him he has a low risk at this time and we will see him in one year.     Waynetta Sandy MD [facility]

## 2016-07-02 NOTE — Addendum Note (Signed)
Addended by: Lianne Cure A on: 07/02/2016 02:57 PM   Modules accepted: Orders

## 2016-07-17 ENCOUNTER — Encounter: Payer: Self-pay | Admitting: Internal Medicine

## 2016-07-17 ENCOUNTER — Ambulatory Visit (INDEPENDENT_AMBULATORY_CARE_PROVIDER_SITE_OTHER): Payer: Medicare Other | Admitting: Internal Medicine

## 2016-07-17 VITALS — BP 170/70 | HR 44 | Temp 97.9°F | Resp 16 | Wt 172.0 lb

## 2016-07-17 DIAGNOSIS — I10 Essential (primary) hypertension: Secondary | ICD-10-CM | POA: Diagnosis not present

## 2016-07-17 DIAGNOSIS — R001 Bradycardia, unspecified: Secondary | ICD-10-CM

## 2016-07-17 DIAGNOSIS — R42 Dizziness and giddiness: Secondary | ICD-10-CM | POA: Diagnosis not present

## 2016-07-17 MED ORDER — MECLIZINE HCL 25 MG PO TABS: 25.0000 mg | ORAL_TABLET | Freq: Three times a day (TID) | ORAL | 0 refills | Status: DC | PRN

## 2016-07-17 NOTE — Assessment & Plan Note (Signed)
Monitor HR at home - slightly low here today - he will monitor -- has been in the 60's at home

## 2016-07-17 NOTE — Progress Notes (Signed)
Subjective:    Patient ID: Brian [name], male    DOB: 20-Sep-1931, 83 y.o.   MRN: [id number]  HPI He is here for an acute visit.   BP yesterday 132/67.  It is usually controlled at home.  He checks it every other day. His HR is usually in the 60's.    Dizziness, balance off:  Yesterday he washed out his right ear with warm water.  He was not able to hear out of it as well when on the phone and figure he had wax in it.  He was able to get a chunk of wax out.  He went outside and cut the grass.  He started having dizziness after cutting the grass.  He felt sea sick.  He had nausea and vomited x 1 this morning.  He has had constant dizziness since it started.  The dizziness comes on with head movements and bending over.  He denies numbness/tingling and weakness in the arms.  He denies headaches.  No fever or cold symptoms.  He has had some chills.  No abd pain.  No cp, palpitations, leg swelling.     Medications and allergies reviewed with patient and updated if appropriate.  Patient Active Problem List   Diagnosis Date Noted  . Nocturnal leg cramps 09/20/2015  . COPD (chronic obstructive pulmonary disease) ([place]) 05/31/2015  . CKD (chronic kidney disease) stage 3, GFR 30-59 ml/min 05/31/2015  . Diabetes ([place]) 04/26/2015  . Epistaxis 04/05/2015  . AAA (abdominal aortic aneurysm) without rupture ([place]) 01/18/2015  . PAD (peripheral artery disease) ([place]) 01/18/2015  . Myalgia and myositis 07/02/2014  . Hereditary and idiopathic peripheral neuropathy 10/29/2012  . Hypertension 05/10/2011  . Bradycardia 05/10/2011  . CAD (coronary artery disease) 09/05/2010  . TINNITUS 04/04/2010  . CORONARY ARTERY BYPASS GRAFT, HX OF 03/31/2010  . Disturbance in sleep behavior 12/28/2008  . Cervicalgia 07/26/2008  . POLYP, COLON 07/16/2007  . Hyperlipidemia 09/24/2006  . History of gout 09/23/2006    Current Outpatient Prescriptions on File Prior to Visit  Medication Sig Dispense Refill  .  ACCU-CHEK COMPACT STRIPS test strip TEST BLOOD SUGAR ONCE DAILY 100 each 1  . albuterol (PROVENTIL HFA;VENTOLIN HFA) 108 (90 Base) MCG/ACT inhaler Inhale 4 puffs into the lungs every 6 (six) hours as needed for wheezing or shortness of breath.     . Aspirin (ADULT ASPIRIN LOW STRENGTH) 81 MG EC tablet Take 81 mg by mouth daily.      . capsaicin (ZOSTRIX) 0.025 % cream Apply 1 application topically as needed (for irritation).     . cetirizine (ZYRTEC) 10 MG tablet Take 10 mg by mouth daily as needed.     . cholecalciferol (VITAMIN D) 1000 UNITS tablet Take 3,000 Units by mouth daily.     . enalapril (VASOTEC) 10 MG tablet Take 1.5 tablets (15 mg total) by mouth daily. 135 tablet 3  . Magnesium Oxide 420 MG TABS Take 420 mg by mouth daily.     . metoprolol succinate (TOPROL XL) 25 MG 24 hr tablet Take 0.5 tablets (12.5 mg total) by mouth daily. 45 tablet 3  . Multiple Vitamins-Minerals (MULTIVITAL PO) Take 1 tablet by mouth daily.     . simvastatin (ZOCOR) 40 MG tablet Take 1 tablet (40 mg total) by mouth daily. 90 tablet 3  . temazepam (RESTORIL) 7.5 MG capsule Take 1 capsule (7.5 mg total) by mouth at bedtime as needed. 90 capsule 1  . triamcinolone (NASACORT AQ) 55  MCG/ACT AERO nasal inhaler Place 2 sprays into the nose daily. 1 Inhaler 12   No current facility-administered medications on file prior to visit.     Past Medical History:  Diagnosis Date  . AAA (abdominal aortic aneurysm) ([place])   . Asthma 1976  . Bronchiolitis May 2016  . CAD (coronary artery disease)    NSTEMI 02/2010;  s/p CABG 1/12 (L-LAD, SOM1, S-PDA); echo in 08/2010: Mild LVH, EF 74%, grade 2 diastolic dysfunction, mild MR, mild LAE, mildly reduced RV function, mild RAE, PASP 42  . Colon polyps   . COPD (chronic obstructive pulmonary disease) ([place])   . Diabetes mellitus 2000   adult onset  . ED (erectile dysfunction)   . Gout   . HTN (hypertension)   . Hyperlipidemia   . Macular degeneration   . Myocardial  infarction ([place]) 1 -13-2012  . Neuropathy     Past Surgical History:  Procedure Laterality Date  . 3 vessel CABG  03/03/2010  . cataract left eye    . COLONOSCOPY     neg 2009  . COLONOSCOPY  2014   5 mm sessile polyp; AVM  . CORONARY ARTERY BYPASS GRAFT  2012   X3  . EYE SURGERY    . INCISION AND DRAINAGE PERIRECTAL ABSCESS    . INGUINAL HERNIA REPAIR      Social History   Social History  . Marital status: Widowed    Spouse name: N/A  . Number of children: N/A  . Years of education: N/A   Occupational History  . RETIRED Retired   Social History Main Topics  . Smoking status: Former Smoker    Types: Cigarettes    Quit date: 02/20/2003  . Smokeless tobacco: Never Used  . Alcohol use 3.6 oz/week    3 Glasses of wine, 3 Shots of liquor per week     Comment:  1-3 drinks a week  . Drug use: No  . Sexual activity: Not on file   Other Topics Concern  . Not on file   Social History Narrative   Quit smoking in 1998. 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     Family History  Problem Relation Age of Onset  . Heart failure Mother   . Heart disease Mother        CHF  . Heart attack Mother   . Hypertension Mother   . Heart attack Father   . Heart disease Father 42       MI  . Hypertension Father   . Heart attack Sister 37  . Heart disease Sister 73       MI  . Diabetes Sister   . Hyperlipidemia Sister   . Hypertension Sister   . Varicose Veins Sister   . Heart attack Brother 53  . Diabetes Brother   . Heart disease Brother 56       MI  . Cancer Brother   . Hypertension Brother   . Deep vein thrombosis Daughter   . Other Son        varicose veins  . Colon cancer Neg Hx     Review of Systems  Constitutional: Positive for chills. Negative for fever.  HENT: Negative for congestion, ear pain, sinus pain and sore throat.   Respiratory: Positive for shortness of breath (DOE - chronic and unchanged). Negative for cough and wheezing.   Cardiovascular:  Negative for chest pain and palpitations.  Gastrointestinal: Positive for nausea and vomiting. Negative for abdominal pain.  Neurological: Positive for dizziness. Negative for weakness, numbness and headaches.       Objective:   Vitals:   07/17/16 1549  BP: (!) 170/70  Pulse: (!) 44  Resp: 16  Temp: 97.9 F (36.6 C)   Filed Weights   07/17/16 1549  Weight: 172 lb (78 kg)   Body mass index is 23.99 kg/m.  Wt Readings from Last 3 Encounters:  07/17/16 172 lb (78 kg)  06/29/16 171 lb 6.4 oz (77.7 kg)  03/28/16 176 lb (79.8 kg)     Physical Exam  Constitutional: He is oriented to person, place, and time. He appears well-developed and well-nourished. No distress.  HENT:  Head: Normocephalic and atraumatic.  Left Ear: External ear normal.  Mouth/Throat: Oropharynx is clear and moist.  B/l ear canals with mild cerumen but no redness, unable to visualize TM b/l clearly  Eyes: Conjunctivae are normal.  Neck: Neck supple. No tracheal deviation present. No thyromegaly present.  Cardiovascular: Normal rate, regular rhythm and normal heart sounds.   Pulmonary/Chest: Effort normal and breath sounds normal. No respiratory distress. He has no wheezes. He has no rales.  Musculoskeletal: He exhibits no edema.  Lymphadenopathy:    He has no cervical adenopathy.  Neurological: He is alert and oriented to person, place, and time. No cranial nerve deficit. Coordination normal.  Normal sensation and strength all extremities  Skin: Skin is warm and dry. He is not diaphoretic.          Assessment & Plan:   See Problem List for Assessment and Plan of chronic medical problems.

## 2016-07-17 NOTE — Assessment & Plan Note (Signed)
Elevated here today, but has been well controlled at home Continue to monitor closely at home

## 2016-07-17 NOTE — Patient Instructions (Signed)
Take meclizine three times a day as needed.  Benign Positional Vertigo Vertigo is the feeling that you or your surroundings are moving when they are not. Benign positional vertigo is the most common form of vertigo. The cause of this condition is not serious (is benign). This condition is triggered by certain movements and positions (is positional). This condition can be dangerous if it occurs while you are doing something that could endanger you or others, such as driving. What are the causes? In many cases, the cause of this condition is not known. It may be caused by a disturbance in an area of the inner ear that helps your brain to sense movement and balance. This disturbance can be caused by a viral infection (labyrinthitis), head injury, or repetitive motion. What increases the risk? This condition is more likely to develop in:  Women.  People who are 62 years of age or older. What are the signs or symptoms? Symptoms of this condition usually happen when you move your head or your eyes in different directions. Symptoms may start suddenly, and they usually last for less than a minute. Symptoms may include:  Loss of balance and falling.  Feeling like you are spinning or moving.  Feeling like your surroundings are spinning or moving.  Nausea and vomiting.  Blurred vision.  Dizziness.  Involuntary eye movement (nystagmus). Symptoms can be mild and cause only slight annoyance, or they can be severe and interfere with daily life. Episodes of benign positional vertigo may return (recur) over time, and they may be triggered by certain movements. Symptoms may improve over time. How is this diagnosed? This condition is usually diagnosed by medical history and a physical exam of the head, neck, and ears. You may be referred to a health care provider who specializes in ear, nose, and throat (ENT) problems (otolaryngologist) or a provider who specializes in disorders of the nervous system  (neurologist). You may have additional testing, including:  MRI.  A CT scan.  Eye movement tests. Your health care provider may ask you to change positions quickly while he or she watches you for symptoms of benign positional vertigo, such as nystagmus. Eye movement may be tested with an electronystagmogram (ENG), caloric stimulation, the Dix-Hallpike test, or the roll test.  An electroencephalogram (EEG). This records electrical activity in your brain.  Hearing tests. How is this treated? Usually, your health care provider will treat this by moving your head in specific positions to adjust your inner ear back to normal. Surgery may be needed in severe cases, but this is rare. In some cases, benign positional vertigo may resolve on its own in 2-4 weeks. Follow these instructions at home: Safety   Move slowly.Avoid sudden body or head movements.  Avoid driving.  Avoid operating heavy machinery.  Avoid doing any tasks that would be dangerous to you or others if a vertigo episode would occur.  If you have trouble walking or keeping your balance, try using a cane for stability. If you feel dizzy or unstable, sit down right away.  Return to your normal activities as told by your health care provider. Ask your health care provider what activities are safe for you. General instructions   Take over-the-counter and prescription medicines only as told by your health care provider.  Avoid certain positions or movements as told by your health care provider.  Drink enough fluid to keep your urine clear or pale yellow.  Keep all follow-up visits as told by your health care  provider. This is important. Contact a health care provider if:  You have a fever.  Your condition gets worse or you develop new symptoms.  Your family or friends notice any behavioral changes.  Your nausea or vomiting gets worse.  You have numbness or a "pins and needles" sensation. Get help right away if:  You  have difficulty speaking or moving.  You are always dizzy.  You faint.  You develop severe headaches.  You have weakness in your legs or arms.  You have changes in your hearing or vision.  You develop a stiff neck.  You develop sensitivity to light. This information is not intended to replace advice given to you by your health care provider. Make sure you discuss any questions you have with your health care provider. Document Released: 11/13/2005 Document Revised: 07/14/2015 Document Reviewed: 05/31/2014 Elsevier Interactive Patient Education  2017 [employer].

## 2016-07-17 NOTE — Assessment & Plan Note (Signed)
No neurological deficit Occurred after cleaning out right ear Worse with head movements Likely BPPV or labyrinthitis Declined PT at this time Meclizine 25 mg TID prn  Call if no improvement

## 2016-07-19 ENCOUNTER — Ambulatory Visit: Payer: TRICARE For Life (TFL) | Admitting: Podiatry

## 2016-08-28 NOTE — Progress Notes (Signed)
Chief Complaint  Patient presents with  . Follow-up    CAD   History of Present Illness: 83 yo AAM with h/o HTN, hyperlipidemia, DM, COPD and CAD s/p CABG here today for cardiac follow up. He was admitted to [facility] January 2012 with a NSTEMI. Cardiac cath January 2012 with severe triple vessel CAD. He underwent 3V CABG on 03/03/10. AAA followed in [facility], last seen there May 2018. Echo March 2017 with normal LV size and function, mild MR.   He is here today for follow up. The patient denies any chest pain, palpitations, lower extremity edema, orthopnea, PND, dizziness, near syncope or syncope. He has baseline dyspnea due to his COPD.   Primary Care Physician: Binnie Rail, MD  Past Medical History:  Diagnosis Date  . AAA (abdominal aortic aneurysm) ([place])   . Asthma 1976  . Bronchiolitis May 2016  . CAD (coronary artery disease)    NSTEMI 02/2010;  s/p CABG 1/12 (L-LAD, SOM1, S-PDA); echo in 08/2010: Mild LVH, EF 42%, grade 2 diastolic dysfunction, mild MR, mild LAE, mildly reduced RV function, mild RAE, PASP 42  . Colon polyps   . COPD (chronic obstructive pulmonary disease) ([place])   . Diabetes mellitus 2000   adult onset  . ED (erectile dysfunction)   . Gout   . HTN (hypertension)   . Hyperlipidemia   . Macular degeneration   . Myocardial infarction ([place]) 1 -13-2012  . Neuropathy     Past Surgical History:  Procedure Laterality Date  . 3 vessel CABG  03/03/2010  . cataract left eye    . COLONOSCOPY     neg 2009  . COLONOSCOPY  2014   5 mm sessile polyp; AVM  . CORONARY ARTERY BYPASS GRAFT  2012   X3  . EYE SURGERY    . INCISION AND DRAINAGE PERIRECTAL ABSCESS    . INGUINAL HERNIA REPAIR      Current Outpatient Prescriptions  Medication Sig Dispense Refill  . ACCU-CHEK COMPACT STRIPS test strip TEST BLOOD SUGAR ONCE DAILY 100 each 1  . albuterol (PROVENTIL HFA;VENTOLIN HFA) 108 (90 Base) MCG/ACT inhaler Inhale 4 puffs into the lungs every 6 (six) hours as  needed for wheezing or shortness of breath.     . Aspirin (ADULT ASPIRIN LOW STRENGTH) 81 MG EC tablet Take 81 mg by mouth daily.      . cetirizine (ZYRTEC) 10 MG tablet Take 10 mg by mouth daily as needed.     . cholecalciferol (VITAMIN D) 1000 UNITS tablet Take 3,000 Units by mouth daily.     . enalapril (VASOTEC) 10 MG tablet Take 1.5 tablets (15 mg total) by mouth daily. 135 tablet 3  . Magnesium Oxide 420 MG TABS Take 420 mg by mouth daily.     . metoprolol succinate (TOPROL XL) 25 MG 24 hr tablet Take 0.5 tablets (12.5 mg total) by mouth daily. 45 tablet 3  . Multiple Vitamins-Minerals (MULTIVITAL PO) Take 1 tablet by mouth daily.     . simvastatin (ZOCOR) 40 MG tablet Take 1 tablet (40 mg total) by mouth daily. 90 tablet 3  . temazepam (RESTORIL) 7.5 MG capsule Take 1 capsule (7.5 mg total) by mouth at bedtime as needed. 90 capsule 1   No current facility-administered medications for this visit.     Allergies  Allergen Reactions  . Meperidine Hcl     Tongue swelling Because of a history of documented adverse serious drug reaction;Medi Alert bracelet  is  recommended  . Zolpidem Tartrate     REACTION: Crazy dreams    Social History   Social History  . Marital status: Widowed    Spouse name: N/A  . Number of children: N/A  . Years of education: N/A   Occupational History  . RETIRED Retired   Social History Main Topics  . Smoking status: Former Smoker    Types: Cigarettes    Quit date: 02/20/2003  . Smokeless tobacco: Never Used  . Alcohol use 3.6 oz/week    3 Glasses of wine, 3 Shots of liquor per week     Comment:  1-3 drinks a week  . Drug use: No  . Sexual activity: Not on file   Other Topics Concern  . Not on file   Social History Narrative   Quit smoking in 1998. 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     Family History  Problem Relation Age of Onset  . Heart failure Mother   . Heart disease Mother        CHF  . Heart attack Mother   . Hypertension  Mother   . Heart attack Father   . Heart disease Father 32       MI  . Hypertension Father   . Heart attack Sister 27  . Heart disease Sister 8       MI  . Diabetes Sister   . Hyperlipidemia Sister   . Hypertension Sister   . Varicose Veins Sister   . Heart attack Brother 14  . Diabetes Brother   . Heart disease Brother 65       MI  . Cancer Brother   . Hypertension Brother   . Deep vein thrombosis Daughter   . Other Son        varicose veins  . Colon cancer Neg Hx     Review of Systems:  As stated in the HPI and otherwise negative.   BP 136/60   Pulse 65   Ht 5\' 11"  (1.803 m)   Wt 174 lb 12.8 oz (79.3 kg)   SpO2 97%   BMI 24.38 kg/m   Physical Examination:  General: Well developed, well nourished, NAD  HEENT: OP clear, mucus membranes moist  SKIN: warm, dry. No rashes. Neuro: No focal deficits  Musculoskeletal: Muscle strength 5/5 all ext  Psychiatric: Mood and affect normal  Neck: No JVD, no carotid bruits, no thyromegaly, no lymphadenopathy.  Lungs:Clear bilaterally, no wheezes, rhonci, crackles Cardiovascular: Regular rate and rhythm. No murmurs, gallops or rubs. Abdomen:Soft. Bowel sounds present. Non-tender.  Extremities: No lower extremity edema. Pulses are 2 + in the bilateral DP/PT.  Echo March 2017: Left ventricle: The cavity size was normal. There was mild focal   basal hypertrophy of the septum. Systolic function was normal.   The estimated ejection fraction was in the range of 55% to 60%.   Wall motion was normal; there were no regional wall motion   abnormalities. Doppler parameters are consistent with abnormal   left ventricular relaxation (grade 1 diastolic dysfunction).   Doppler parameters are consistent with high ventricular filling   pressure. - Mitral valve: There was mild regurgitation. - Right ventricle: The cavity size was mildly dilated. - Right atrium: The atrium was mildly dilated.  Impressions:  - Normal LV systolic  function; grade 1 diastolic dysfunction; mild   MR; mild RAE and RVE; trace TR.  EKG:  EKG is ordered today. The ekg ordered today demonstrates sinus bradycardia, rate 56 bpm.  1st degree AV block. PAC. RBBB  Recent Labs: 09/20/2015: BUN 22; Hemoglobin 13.6; Magnesium 2.0; Platelets 217.0; TSH 1.16 03/16/2016: ALT 21; Creatinine 1.5; Potassium 4.8; Sodium 143   Lipid Panel    Component Value Date/Time   CHOL 155 03/16/2016   TRIG 55 03/16/2016   HDL 76 (A) 03/16/2016   CHOLHDL 3 10/29/2012 1005   VLDL 17.8 10/29/2012 1005   LDLCALC 68 03/16/2016     Wt Readings from Last 3 Encounters:  08/29/16 174 lb 12.8 oz (79.3 kg)  07/17/16 172 lb (78 kg)  06/29/16 171 lb 6.4 oz (77.7 kg)     Other studies Reviewed: Additional studies/ records that were reviewed today include: . Review of the above records demonstrates:    Assessment and Plan:   1. CAD s/p CABG without angina: He is having no chest pain suggestive of angina. Will continue ASA, beta blocker, statin.    2. HTN: BP is controlled. No changes.   3. Hyperlipidemia: LDL is at goal. Continue statin.   Current medicines are reviewed at length with the patient today.  The patient does not have concerns regarding medicines.  The following changes have been made:  no change  Labs/ tests ordered today include:   Orders Placed This Encounter  Procedures  . EKG 12-Lead    Disposition:   FU with me in 12  months  Signed, Lauree Chandler, MD 08/29/2016 10:24 AM    [facility] [place], [place], [place]  [address] Phone: [phone]; Fax: [phone]

## 2016-08-29 ENCOUNTER — Ambulatory Visit (INDEPENDENT_AMBULATORY_CARE_PROVIDER_SITE_OTHER): Payer: Medicare Other | Admitting: Cardiovascular Disease

## 2016-08-29 ENCOUNTER — Encounter: Payer: Self-pay | Admitting: Cardiovascular Disease

## 2016-08-29 VITALS — BP 136/60 | HR 65 | Ht 71.0 in | Wt 174.8 lb

## 2016-08-29 DIAGNOSIS — I25810 Atherosclerosis of coronary artery bypass graft(s) without angina pectoris: Secondary | ICD-10-CM | POA: Diagnosis not present

## 2016-08-29 DIAGNOSIS — E7800 Pure hypercholesterolemia, unspecified: Secondary | ICD-10-CM

## 2016-08-29 DIAGNOSIS — I10 Essential (primary) hypertension: Secondary | ICD-10-CM

## 2016-08-29 NOTE — Patient Instructions (Signed)

## 2016-08-30 ENCOUNTER — Ambulatory Visit (INDEPENDENT_AMBULATORY_CARE_PROVIDER_SITE_OTHER): Payer: Medicare Other | Admitting: Podiatry

## 2016-08-30 ENCOUNTER — Ambulatory Visit (INDEPENDENT_AMBULATORY_CARE_PROVIDER_SITE_OTHER): Payer: Medicare Other

## 2016-08-30 ENCOUNTER — Encounter: Payer: Self-pay | Admitting: Podiatry

## 2016-08-30 VITALS — BP 176/91 | HR 60

## 2016-08-30 DIAGNOSIS — R600 Localized edema: Secondary | ICD-10-CM

## 2016-08-30 DIAGNOSIS — R609 Edema, unspecified: Secondary | ICD-10-CM | POA: Diagnosis not present

## 2016-08-30 DIAGNOSIS — M779 Enthesopathy, unspecified: Secondary | ICD-10-CM

## 2016-08-30 DIAGNOSIS — I999 Unspecified disorder of circulatory system: Secondary | ICD-10-CM | POA: Diagnosis not present

## 2016-08-30 NOTE — Progress Notes (Signed)
   Subjective:    Patient ID: Brian [name], male    DOB: 19-Nov-1931, 83 y.o.   MRN: [id number]  HPI  Chief Complaint  Patient presents with  . Foot Pain    swelling in Rt foot 10 days ago       Review of Systems  Constitutional: Negative.   HENT: Negative.   Eyes: Negative.   Respiratory: Negative.   Cardiovascular: Negative.   Gastrointestinal: Negative.   Endocrine: Positive for cold intolerance.  Genitourinary: Negative.   Musculoskeletal: Negative.   Skin: Positive for color change.  Allergic/Immunologic: Negative.   Neurological: Negative.   Hematological: Negative.   Psychiatric/Behavioral: Negative.        Objective:   Physical Exam        Assessment & Plan:

## 2016-08-31 NOTE — Progress Notes (Signed)
Subjective:    Patient ID: Brian [name], male   DOB: 83 y.o.   MRN: [id number]   HPI patient states that he developed swelling in his right foot and was concerned about this and wanted to make sure there was nothing pathologically wrong with history of diabetes with neuropathy    ROS      Objective:  Physical Exam neurovascular status found to be unchanged from previous visits with negative Homans sign noted. Patient's found to have mild forefoot edema right with nonpitting type swelling with patient stating that it has reduced some over the last week with minimal discomfort noted     Assessment:     Possibility for trauma versus other unknown swelling event with no history that there was any significant trauma    Plan:    H&P x-ray reviewed and compression stocking applied with the advised on elevation soaks. If any leg pain should occur further swelling patient is to go to the emergency room and contact us but I do think this should be something that will resolve over time with compression elevation  X-rays were negative for signs of fracture or bone pathology

## 2016-09-09 IMAGING — DX DG CHEST 2V
2 series · 2 of 2 positions shown · non-contrast
Comparison: Chest x-ray of 07/08/2014

CLINICAL DATA: COPD exacerbation Um a history of asthma, former
smoking history

EXAM:
CHEST  2 VIEW

[chest pa]
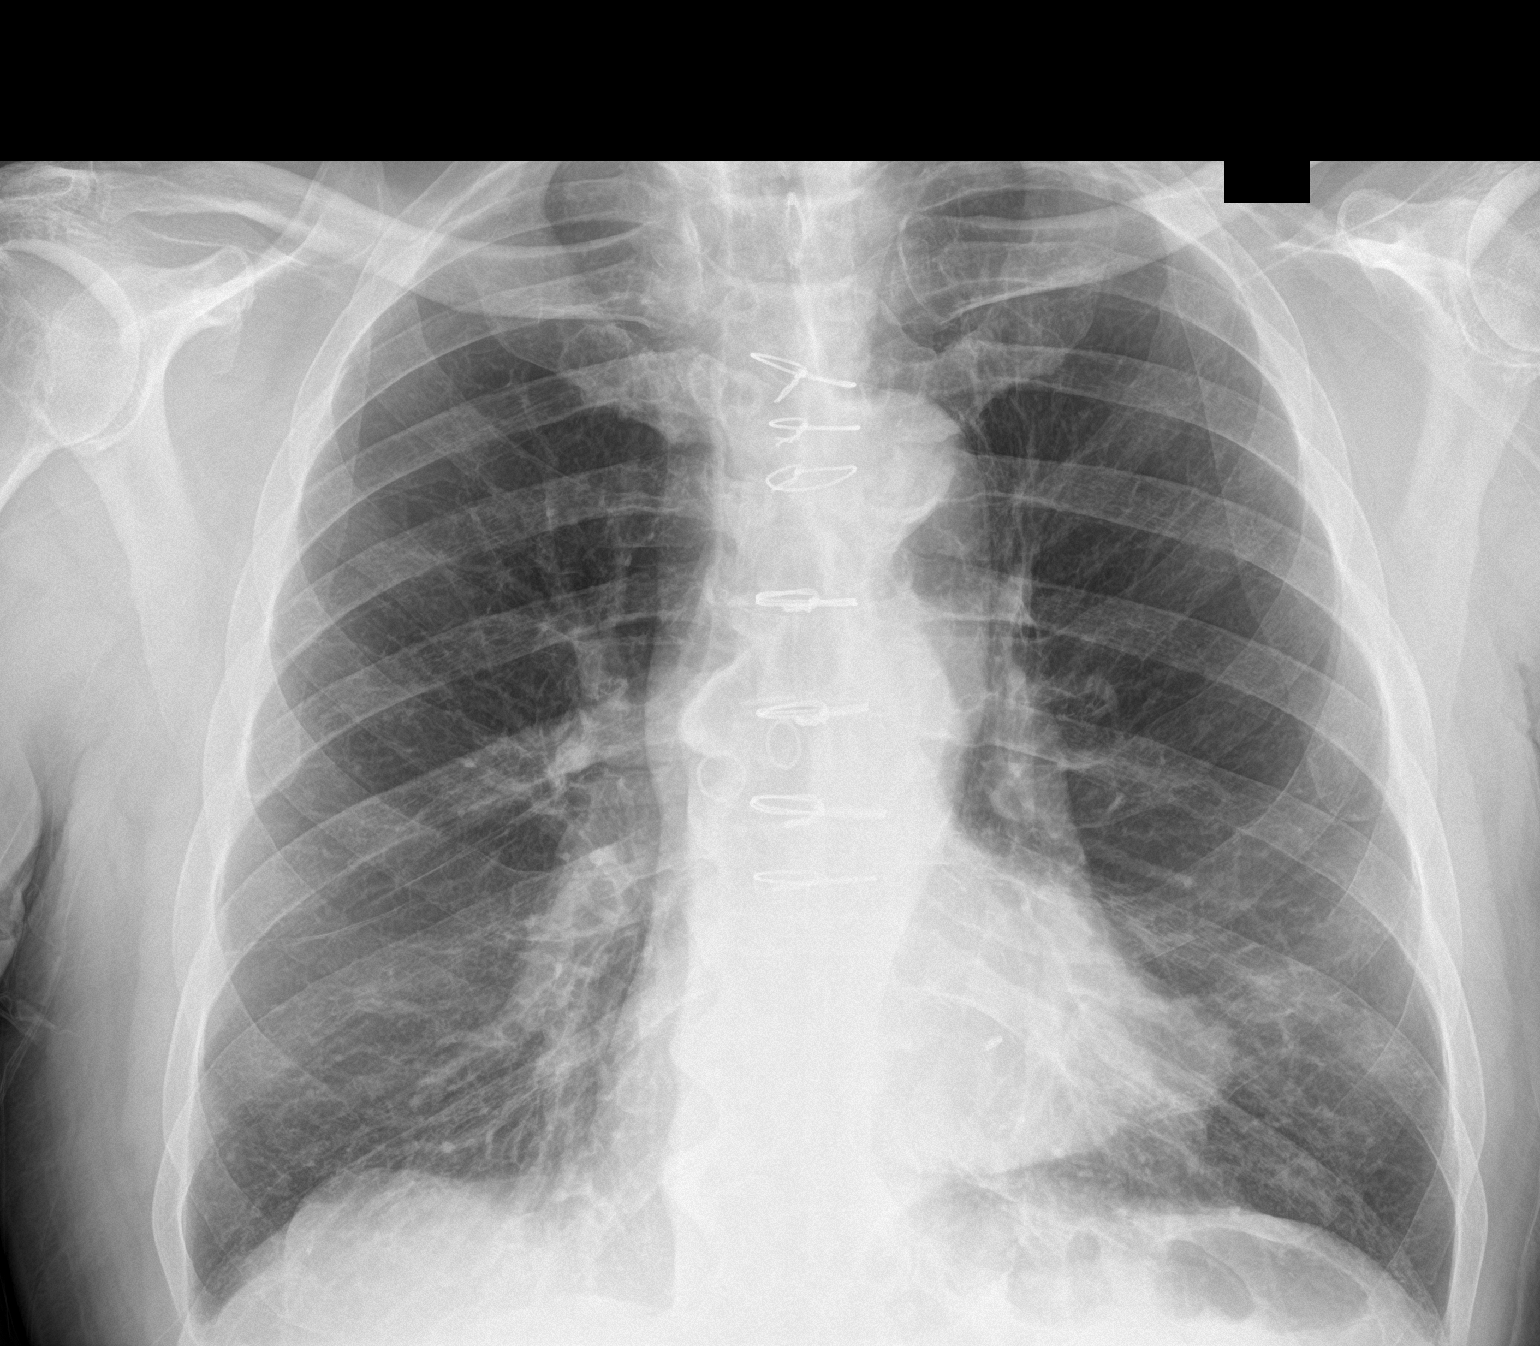

[chest lat]
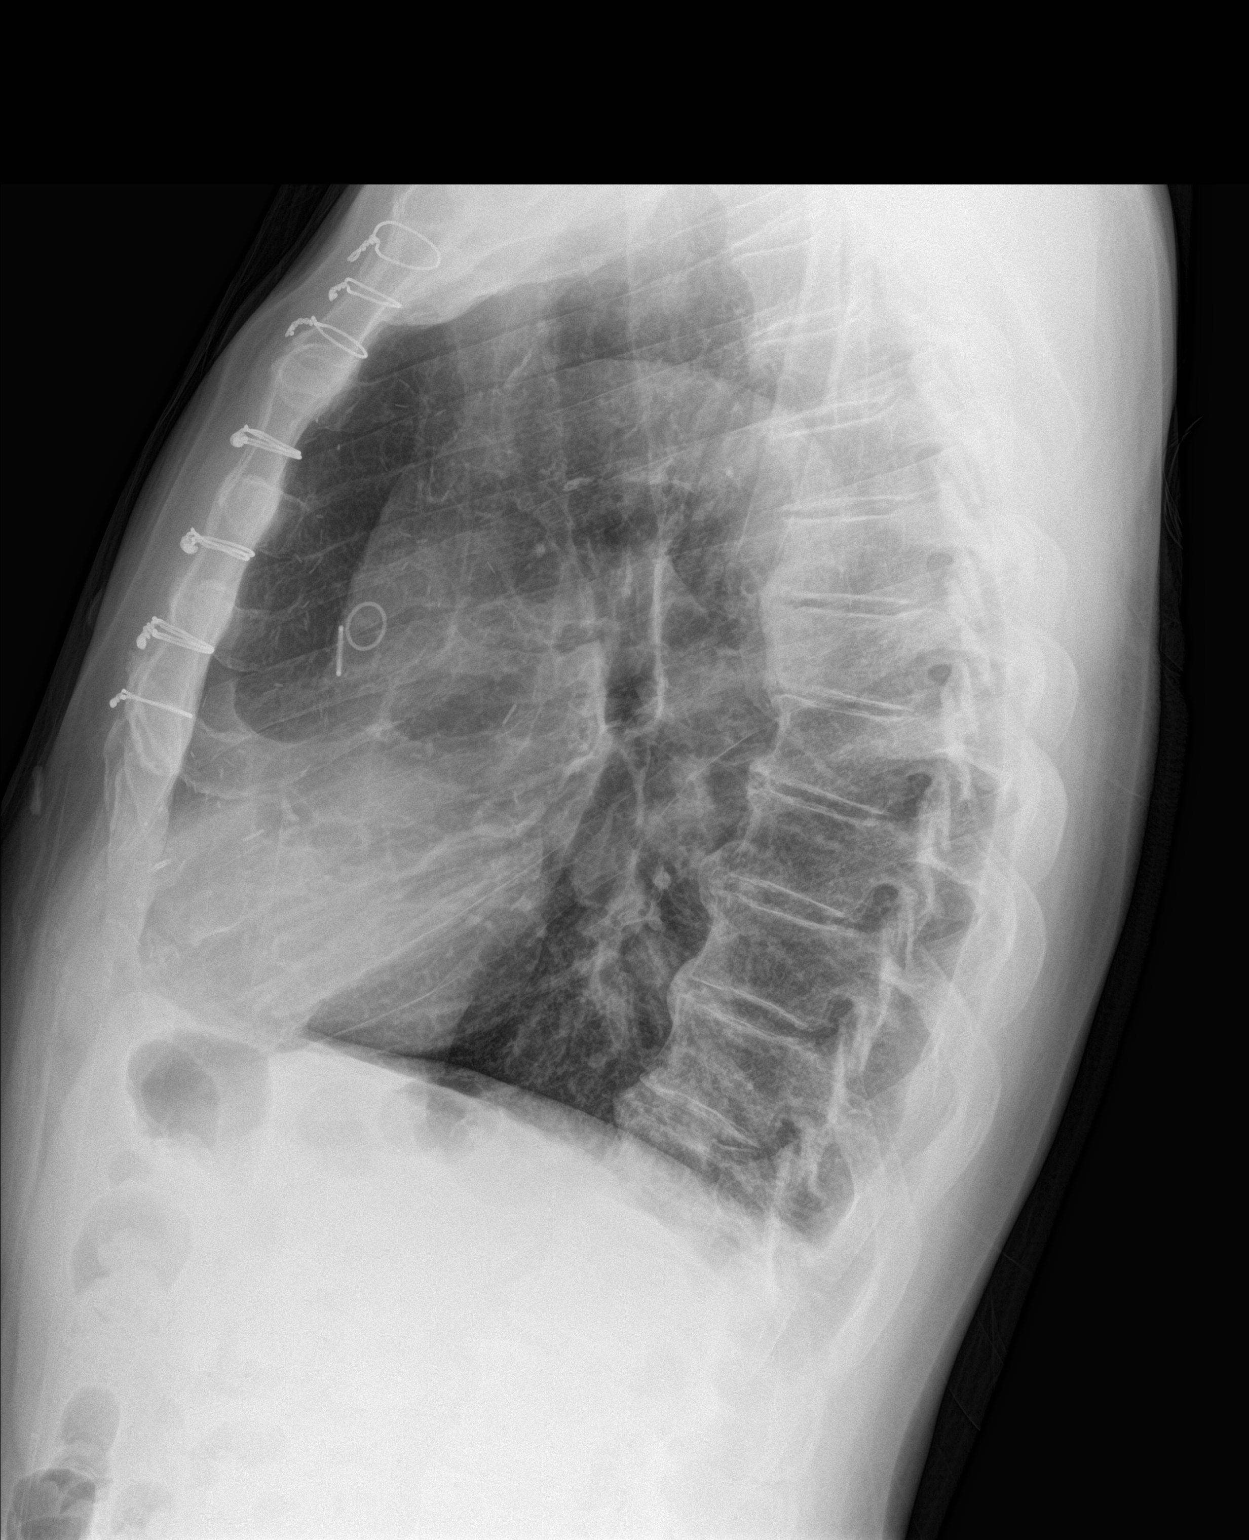

[2 of 2 positions shown; findings below may reference images not displayed]

FINDINGS: The lungs are clear but hyperaerated with flattened hemidiaphragms
suggesting and element of emphysema. No infiltrate or effusion is
seen. Mediastinal and hilar contours are unremarkable. The heart is
within upper limits normal. Median sternotomy sutures are noted from
prior CABG. There are degenerative changes throughout the mid to
lower thoracic spine.
IMPRESSION: No definite active process.  Emphysema.

## 2016-09-24 NOTE — Patient Instructions (Addendum)
  Test(s) ordered today. Your results will be released to [place] (or called to you) after review, usually within 72hours after test completion. If any changes need to be made, you will be notified at that same time.  All other Health Maintenance issues reviewed.   All recommended immunizations and age-appropriate screenings are up-to-date or discussed.  No immunizations administered today.   Medications reviewed and updated.  No changes recommended at this time.  Your prescription(s) have been submitted to your pharmacy. Please take as directed and contact our office if you believe you are having problem(s) with the medication(s).   Please followup in 6 months

## 2016-09-24 NOTE — Progress Notes (Signed)
Subjective:    Patient ID: Brian [name], male    DOB: 12-13-1931, 83 y.o.   MRN: [id number]  HPI The patient is here for follow up.  Needs wellnes  CAD, Hypertension: He is taking his medication daily. He is compliant with a low sodium diet.  He denies chest pain, palpitations, edema, and regular headaches. He is exercising regularly - twice a week walking.      Hyperlipidemia: He is taking his medication daily. He is compliant with a low fat/cholesterol diet. He is exercising regularly. He denies myalgias.   Diabetes: He is controlling his sugars with diet. He is compliant with a diabetic diet. He is exercising regularly. He monitors his sugars and they have been running 118-124. He checks his feet daily and denies foot lesions. He is up-to-date with an ophthalmology examination.   Sleep disturbance:  He takes restoril as needed for his sleep.  The medication works fairly well and he denies side effects.    COPD:  He gets SOB and wheeze with some activities.  He uses albuterol as needed - he has needed the albuterol a little more. He denies any cough, fever or chills. The albuterol works well.  Medications and allergies reviewed with patient and updated if appropriate.  Patient Active Problem List   Diagnosis Date Noted  . Vertigo 07/17/2016  . Nocturnal leg cramps 09/20/2015  . COPD (chronic obstructive pulmonary disease) ([place]) 05/31/2015  . CKD (chronic kidney disease) stage 3, GFR 30-59 ml/min 05/31/2015  . Diabetes ([place]) 04/26/2015  . Epistaxis 04/05/2015  . AAA (abdominal aortic aneurysm) without rupture ([place]) 01/18/2015  . PAD (peripheral artery disease) ([place]) 01/18/2015  . Myalgia and myositis 07/02/2014  . Hereditary and idiopathic peripheral neuropathy 10/29/2012  . Hypertension 05/10/2011  . Bradycardia 05/10/2011  . CAD (coronary artery disease) 09/05/2010  . TINNITUS 04/04/2010  . CORONARY ARTERY BYPASS GRAFT, HX OF 03/31/2010  . Disturbance in sleep behavior  12/28/2008  . Cervicalgia 07/26/2008  . POLYP, COLON 07/16/2007  . Hyperlipidemia 09/24/2006  . History of gout 09/23/2006    Current Outpatient Prescriptions on File Prior to Visit  Medication Sig Dispense Refill  . ACCU-CHEK COMPACT STRIPS test strip TEST BLOOD SUGAR ONCE DAILY 100 each 1  . albuterol (PROVENTIL HFA;VENTOLIN HFA) 108 (90 Base) MCG/ACT inhaler Inhale 2 puffs into the lungs every 6 (six) hours as needed for wheezing or shortness of breath.     . Aspirin (ADULT ASPIRIN LOW STRENGTH) 81 MG EC tablet Take 81 mg by mouth daily.      . cetirizine (ZYRTEC) 10 MG tablet Take 10 mg by mouth daily as needed.     . cholecalciferol (VITAMIN D) 1000 UNITS tablet Take 3,000 Units by mouth daily.     . enalapril (VASOTEC) 10 MG tablet Take 1.5 tablets (15 mg total) by mouth daily. 135 tablet 3  . Magnesium Oxide 420 MG TABS Take 420 mg by mouth daily.     . metoprolol succinate (TOPROL XL) 25 MG 24 hr tablet Take 0.5 tablets (12.5 mg total) by mouth daily. 45 tablet 3  . Multiple Vitamins-Minerals (MULTIVITAL PO) Take 1 tablet by mouth daily.     . simvastatin (ZOCOR) 40 MG tablet Take 1 tablet (40 mg total) by mouth daily. 90 tablet 3   No current facility-administered medications on file prior to visit.     Past Medical History:  Diagnosis Date  . AAA (abdominal aortic aneurysm) ([place])   . Asthma 1976  .  Bronchiolitis May 2016  . CAD (coronary artery disease)    NSTEMI 02/2010;  s/p CABG 1/12 (L-LAD, SOM1, S-PDA); echo in 08/2010: Mild LVH, EF 87%, grade 2 diastolic dysfunction, mild MR, mild LAE, mildly reduced RV function, mild RAE, PASP 42  . Colon polyps   . COPD (chronic obstructive pulmonary disease) ([place])   . Diabetes mellitus 2000   adult onset  . ED (erectile dysfunction)   . Gout   . HTN (hypertension)   . Hyperlipidemia   . Macular degeneration   . Myocardial infarction ([place]) 1 -13-2012  . Neuropathy     Past Surgical History:  Procedure Laterality Date  . 3  vessel CABG  03/03/2010  . cataract left eye    . COLONOSCOPY     neg 2009  . COLONOSCOPY  2014   5 mm sessile polyp; AVM  . CORONARY ARTERY BYPASS GRAFT  2012   X3  . EYE SURGERY    . INCISION AND DRAINAGE PERIRECTAL ABSCESS    . INGUINAL HERNIA REPAIR      Social History   Social History  . Marital status: Widowed    Spouse name: N/A  . Number of children: N/A  . Years of education: N/A   Occupational History  . RETIRED Retired   Social History Main Topics  . Smoking status: Former Smoker    Types: Cigarettes    Quit date: 02/20/2003  . Smokeless tobacco: Never Used  . Alcohol use 3.6 oz/week    3 Glasses of wine, 3 Shots of liquor per week     Comment:  1-3 drinks a week  . Drug use: No  . Sexual activity: Not on file   Other Topics Concern  . Not on file   Social History Narrative   Quit smoking in 1998. 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     Family History  Problem Relation Age of Onset  . Heart failure Mother   . Heart disease Mother        CHF  . Heart attack Mother   . Hypertension Mother   . Heart attack Father   . Heart disease Father 28       MI  . Hypertension Father   . Heart attack Sister 3  . Heart disease Sister 75       MI  . Diabetes Sister   . Hyperlipidemia Sister   . Hypertension Sister   . Varicose Veins Sister   . Heart attack Brother 38  . Diabetes Brother   . Heart disease Brother 63       MI  . Cancer Brother   . Hypertension Brother   . Deep vein thrombosis Daughter   . Other Son        varicose veins  . Colon cancer Neg Hx     Review of Systems  Constitutional: Negative for chills and fever.  Respiratory: Positive for shortness of breath and wheezing. Negative for cough.   Cardiovascular: Negative for chest pain, palpitations and leg swelling.  Neurological: Negative for light-headedness and headaches.       Objective:   Vitals:   09/26/16 0847  BP: 134/68  Pulse: (!) 58  Resp: 16  Temp: 97.6 F  (36.4 C)   Wt Readings from Last 3 Encounters:  09/26/16 173 lb (78.5 kg)  08/29/16 174 lb 12.8 oz (79.3 kg)  07/17/16 172 lb (78 kg)   Body mass index is 24.13 kg/m.   Physical Exam  Constitutional: Appears well-developed and well-nourished. No distress.  HENT:  Head: Normocephalic and atraumatic.  Neck: Neck supple. No tracheal deviation present. No thyromegaly present.  No cervical lymphadenopathy Cardiovascular: Normal rate, regular rhythm and normal heart sounds.   No murmur heard. No carotid bruit .  No edema Pulmonary/Chest: Effort normal and breath sounds normal. No respiratory distress. No has no wheezes. No rales.  Skin: Skin is warm and dry. Not diaphoretic.  Psychiatric: Normal mood and affect. Behavior is normal.      Assessment & Plan:    See Problem List for Assessment and Plan of chronic medical problems.

## 2016-09-26 ENCOUNTER — Ambulatory Visit (INDEPENDENT_AMBULATORY_CARE_PROVIDER_SITE_OTHER): Payer: Medicare Other | Admitting: Internal Medicine

## 2016-09-26 ENCOUNTER — Other Ambulatory Visit (INDEPENDENT_AMBULATORY_CARE_PROVIDER_SITE_OTHER): Payer: Medicare Other

## 2016-09-26 ENCOUNTER — Encounter: Payer: Self-pay | Admitting: Internal Medicine

## 2016-09-26 VITALS — BP 134/68 | HR 58 | Temp 97.6°F | Resp 16 | Wt 173.0 lb

## 2016-09-26 DIAGNOSIS — I2510 Atherosclerotic heart disease of native coronary artery without angina pectoris: Secondary | ICD-10-CM

## 2016-09-26 DIAGNOSIS — E7800 Pure hypercholesterolemia, unspecified: Secondary | ICD-10-CM | POA: Diagnosis not present

## 2016-09-26 DIAGNOSIS — N183 Chronic kidney disease, stage 3 unspecified: Secondary | ICD-10-CM

## 2016-09-26 DIAGNOSIS — G479 Sleep disorder, unspecified: Secondary | ICD-10-CM

## 2016-09-26 DIAGNOSIS — E1122 Type 2 diabetes mellitus with diabetic chronic kidney disease: Secondary | ICD-10-CM

## 2016-09-26 DIAGNOSIS — N182 Chronic kidney disease, stage 2 (mild): Secondary | ICD-10-CM

## 2016-09-26 DIAGNOSIS — I10 Essential (primary) hypertension: Secondary | ICD-10-CM

## 2016-09-26 LAB — COMPREHENSIVE METABOLIC PANEL
ALT: 14 U/L (ref 0–53)
AST: 16 U/L (ref 0–37)
Albumin: 4.3 g/dL (ref 3.5–5.2)
Alkaline Phosphatase: 65 U/L (ref 39–117)
BUN: 24 mg/dL — ABNORMAL HIGH (ref 6–23)
CALCIUM: 9.6 mg/dL (ref 8.4–10.5)
CHLORIDE: 104 meq/L (ref 96–112)
CO2: 28 meq/L (ref 19–32)
CREATININE: 1.54 mg/dL — AB (ref 0.40–1.50)
GFR: 55.81 mL/min — ABNORMAL LOW (ref >60.00)
Glucose, Bld: 106 mg/dL — ABNORMAL HIGH (ref 70–99)
Potassium: 5.8 mEq/L — ABNORMAL HIGH (ref 3.5–5.1)
Sodium: 137 mEq/L (ref 135–145)
Total Bilirubin: 0.7 mg/dL (ref 0.2–1.2)
Total Protein: 7.5 g/dL (ref 6.0–8.3)

## 2016-09-26 LAB — LIPID PANEL
CHOLESTEROL: 151 mg/dL (ref 0–200)
HDL: 51.3 mg/dL (ref >39.00)
LDL Cholesterol: 81 mg/dL (ref 0–99)
NONHDL: 99.61
TRIGLYCERIDES: 91 mg/dL (ref 0.0–149.0)
Total CHOL/HDL Ratio: 3
VLDL: 18.2 mg/dL (ref 0.0–40.0)

## 2016-09-26 LAB — CBC WITH DIFFERENTIAL/PLATELET
BASOS PCT: 1.3 % (ref 0.0–3.0)
Basophils Absolute: 0.1 10*3/uL (ref 0.0–0.1)
EOS ABS: 0.4 10*3/uL (ref 0.0–0.7)
Eosinophils Relative: 6.8 % — ABNORMAL HIGH (ref 0.0–5.0)
HEMATOCRIT: 43.8 % (ref 39.0–52.0)
Hemoglobin: 14.5 g/dL (ref 13.0–17.0)
LYMPHS ABS: 1.9 10*3/uL (ref 0.7–4.0)
Lymphocytes Relative: 33.5 % (ref 12.0–46.0)
MCHC: 33.2 g/dL (ref 30.0–36.0)
MCV: 93.9 fl (ref 78.0–100.0)
MONO ABS: 0.6 10*3/uL (ref 0.1–1.0)
Monocytes Relative: 10.3 % (ref 3.0–12.0)
NEUTROS ABS: 2.8 10*3/uL (ref 1.4–7.7)
Neutrophils Relative %: 48.1 % (ref 43.0–77.0)
PLATELETS: 192 10*3/uL (ref 150.0–400.0)
RBC: 4.67 Mil/uL (ref 4.22–5.81)
RDW: 13.3 % (ref 11.5–15.5)
WBC: 5.8 10*3/uL (ref 4.0–10.5)

## 2016-09-26 LAB — HEMOGLOBIN A1C: Hgb A1c MFr Bld: 6.3 % (ref 4.6–6.5)

## 2016-09-26 LAB — TSH: TSH: 1.88 u[IU]/mL (ref 0.35–4.50)

## 2016-09-26 MED ORDER — ZOSTER VAC RECOMB ADJUVANTED 50 MCG/0.5ML IM SUSR: 0.5000 mL | Freq: Once | INTRAMUSCULAR | 1 refills | Status: AC

## 2016-09-26 MED ORDER — TEMAZEPAM 7.5 MG PO CAPS: 7.5000 mg | ORAL_CAPSULE | Freq: Every evening | ORAL | 1 refills | Status: DC | PRN

## 2016-09-26 NOTE — Assessment & Plan Note (Signed)
Diet controlled Sugars are well controlled at home Continue diabetic diet and regular exercise Will check A1c

## 2016-09-26 NOTE — Assessment & Plan Note (Signed)
Controlled Taking Restoril nightly without side effects  will continue

## 2016-09-26 NOTE — Assessment & Plan Note (Addendum)
No chest pain, palpitations Continue aspirin, statin Continue regular exercise Cbc, cmp, lipids

## 2016-09-26 NOTE — Assessment & Plan Note (Signed)
BP Readings from Last 3 Encounters:  09/26/16 134/68  08/30/16 (!) 176/91  08/29/16 136/60   BP well controlled Current regimen effective and well tolerated Continue current medications at current doses cmp

## 2016-09-26 NOTE — Assessment & Plan Note (Signed)
Check lipid panel  Continue daily statin Regular exercise and healthy diet encouraged  

## 2016-09-26 NOTE — Assessment & Plan Note (Signed)
Check CMP He does not take any NSAIDs

## 2016-09-28 ENCOUNTER — Other Ambulatory Visit: Payer: Self-pay | Admitting: Internal Medicine

## 2016-09-28 DIAGNOSIS — E875 Hyperkalemia: Secondary | ICD-10-CM

## 2016-10-01 ENCOUNTER — Other Ambulatory Visit (INDEPENDENT_AMBULATORY_CARE_PROVIDER_SITE_OTHER): Payer: Medicare Other

## 2016-10-01 DIAGNOSIS — E875 Hyperkalemia: Secondary | ICD-10-CM

## 2016-10-01 LAB — BASIC METABOLIC PANEL
BUN: 23 mg/dL (ref 6–23)
CHLORIDE: 103 meq/L (ref 96–112)
CO2: 26 meq/L (ref 19–32)
Calcium: 9.3 mg/dL (ref 8.4–10.5)
Creatinine, Ser: 1.57 mg/dL — ABNORMAL HIGH (ref 0.40–1.50)
GFR: 54.58 mL/min — ABNORMAL LOW (ref >60.00)
GLUCOSE: 100 mg/dL — AB (ref 70–99)
POTASSIUM: 5 meq/L (ref 3.5–5.1)
SODIUM: 136 meq/L (ref 135–145)

## 2016-11-22 DIAGNOSIS — H11003 Unspecified pterygium of eye, bilateral: Secondary | ICD-10-CM | POA: Diagnosis not present

## 2016-11-22 DIAGNOSIS — H52203 Unspecified astigmatism, bilateral: Secondary | ICD-10-CM | POA: Diagnosis not present

## 2016-11-22 DIAGNOSIS — H43813 Vitreous degeneration, bilateral: Secondary | ICD-10-CM | POA: Diagnosis not present

## 2016-11-22 DIAGNOSIS — E119 Type 2 diabetes mellitus without complications: Secondary | ICD-10-CM | POA: Diagnosis not present

## 2016-11-22 LAB — HM DIABETES EYE EXAM

## 2016-12-11 ENCOUNTER — Ambulatory Visit: Payer: Medicare Other | Admitting: General Practice

## 2017-03-01 ENCOUNTER — Other Ambulatory Visit: Payer: Self-pay

## 2017-03-01 DIAGNOSIS — I83893 Varicose veins of bilateral lower extremities with other complications: Secondary | ICD-10-CM

## 2017-03-15 ENCOUNTER — Encounter: Payer: Self-pay | Admitting: Family

## 2017-03-15 ENCOUNTER — Ambulatory Visit (INDEPENDENT_AMBULATORY_CARE_PROVIDER_SITE_OTHER): Payer: Medicare Other | Admitting: Family

## 2017-03-15 ENCOUNTER — Other Ambulatory Visit: Payer: Self-pay

## 2017-03-15 ENCOUNTER — Ambulatory Visit (HOSPITAL_COMMUNITY)
Admission: RE | Admit: 2017-03-15 | Discharge: 2017-03-15 | Disposition: A | Discharge status: Home / Self Care | Payer: Medicare Other | Source: Ambulatory Visit | Attending: Family | Admitting: Family

## 2017-03-15 VITALS — BP 136/78 | HR 64 | Temp 97.7°F | Resp 14 | Ht 71.0 in | Wt 166.0 lb

## 2017-03-15 DIAGNOSIS — L97121 Non-pressure chronic ulcer of left thigh limited to breakdown of skin: Secondary | ICD-10-CM

## 2017-03-15 DIAGNOSIS — I714 Abdominal aortic aneurysm, without rupture, unspecified: Secondary | ICD-10-CM

## 2017-03-15 DIAGNOSIS — I83021 Varicose veins of left lower extremity with ulcer of thigh: Secondary | ICD-10-CM

## 2017-03-15 DIAGNOSIS — I83893 Varicose veins of bilateral lower extremities with other complications: Secondary | ICD-10-CM | POA: Insufficient documentation

## 2017-03-15 NOTE — Progress Notes (Signed)
CC: Venous Insufficiency, varicose veins, hx of AAA  History of Present Illness  Brian [name] is a 84 y.o. ([birth year]) male with history of diabetes and coronary artery disease and former smoking. He has known abdominal aortic aneurysm and has been followed with CT scan from 2017 for the aneurysm; was 4.7 cm.   He is not having any abdominal pain or back pain. He continues to walk with no limitations. He does complain of varicosities in his left lower extremity on the anterior leg to give him discomfort and has bled on occasion. He did not want these repaired at his May 2018 visit. He does take 81 mg aspirin on a daily basis. He also takes a daily statin.  Dr. Donzetta Matters last evaluated pt on 06-29-16. At that time pt has anterior left leg varicosities and spider vein of bilateral ankles left > right  with known abdominal aortic aneurysm with a very short neck bilateral CT scan. Duplex demonstrated stable size at that time and pt was to follow up in 1 year with repeat abdominal aortic duplex.  Dr. Donzetta Matters discussed with him that should he need repair he would need a CT scan prior. He also has complaints of varicose veins but does not want intervention at that time but we will get reflux study prior to his follow-up in 1 year.   He returns today with c/o intermittent shooting pains in both thighs and calves, left was worse than right, that started about 17 days ago, pain worsened about 2 weeks ago. Pain resolved about 4 days after it started. He started taking Tylenol to relieve the pain.  He has not had this type of pain before. He occasionally wears knee high compression hose, states they help little, wears them more in the Winter.   He has no known hx of back problems.  ABI's in May of 2017 showed 0.78 on the right with monophasic waveforms, 1.05 on the left with biphasic waveforms.   He quit smoking in 2005. He states he does have DM, states his last A1C was 6.4, does not take any glycemic  lowering agents.    Past Medical History:  Diagnosis Date  . AAA (abdominal aortic aneurysm) ([place])   . Asthma 1976  . Bronchiolitis May 2016  . CAD (coronary artery disease)    NSTEMI 02/2010;  s/p CABG 1/12 (L-LAD, SOM1, S-PDA); echo in 08/2010: Mild LVH, EF 10%, grade 2 diastolic dysfunction, mild MR, mild LAE, mildly reduced RV function, mild RAE, PASP 42  . Colon polyps   . COPD (chronic obstructive pulmonary disease) ([place])   . Diabetes mellitus 2000   adult onset  . ED (erectile dysfunction)   . Gout   . HTN (hypertension)   . Hyperlipidemia   . Macular degeneration   . Myocardial infarction ([place]) 1 -13-2012  . Neuropathy     Social History Social History   Tobacco Use  . Smoking status: Former Smoker    Types: Cigarettes    Last attempt to quit: 02/20/2003    Years since quitting: 14.0  . Smokeless tobacco: Never Used  Substance Use Topics  . Alcohol use: Yes    Alcohol/week: 3.6 oz    Types: 3 Glasses of wine, 3 Shots of liquor per week    Comment:  1-3 drinks a week  . Drug use: No    Family History Family History  Problem Relation Age of Onset  . Heart failure Mother   . Heart disease  Mother        CHF  . Heart attack Mother   . Hypertension Mother   . Heart attack Father   . Heart disease Father 48       MI  . Hypertension Father   . Heart attack Sister 72  . Heart disease Sister 31       MI  . Diabetes Sister   . Hyperlipidemia Sister   . Hypertension Sister   . Varicose Veins Sister   . Heart attack Brother 68  . Diabetes Brother   . Heart disease Brother 62       MI  . Cancer Brother   . Hypertension Brother   . Deep vein thrombosis Daughter   . Other Son        varicose veins  . Colon cancer Neg Hx     Surgical History Past Surgical History:  Procedure Laterality Date  . 3 vessel CABG  03/03/2010  . cataract left eye    . COLONOSCOPY     neg 2009  . COLONOSCOPY  2014   5 mm sessile polyp; AVM  . CORONARY ARTERY BYPASS GRAFT   2012   X3  . EYE SURGERY    . INCISION AND DRAINAGE PERIRECTAL ABSCESS    . INGUINAL HERNIA REPAIR      Allergies  Allergen Reactions  . Meperidine Hcl     Tongue swelling Because of a history of documented adverse serious drug reaction;Medi Alert bracelet  is recommended  . Zolpidem Tartrate     REACTION: Crazy dreams    Current Outpatient Medications  Medication Sig Dispense Refill  . ACCU-CHEK COMPACT STRIPS test strip TEST BLOOD SUGAR ONCE DAILY 100 each 1  . albuterol (PROVENTIL HFA;VENTOLIN HFA) 108 (90 Base) MCG/ACT inhaler Inhale 2 puffs into the lungs every 6 (six) hours as needed for wheezing or shortness of breath.     . Aspirin (ADULT ASPIRIN LOW STRENGTH) 81 MG EC tablet Take 81 mg by mouth daily.      . cetirizine (ZYRTEC) 10 MG tablet Take 10 mg by mouth daily as needed.     . cholecalciferol (VITAMIN D) 1000 UNITS tablet Take 3,000 Units by mouth daily.     . enalapril (VASOTEC) 10 MG tablet Take 1.5 tablets (15 mg total) by mouth daily. 135 tablet 3  . Magnesium Oxide 420 MG TABS Take 420 mg by mouth daily.     . meclizine (ANTIVERT) 25 MG tablet Take 25 mg by mouth 3 (three) times daily as needed for dizziness.    . metoprolol succinate (TOPROL XL) 25 MG 24 hr tablet Take 0.5 tablets (12.5 mg total) by mouth daily. 45 tablet 3  . Multiple Vitamins-Minerals (MULTIVITAL PO) Take 1 tablet by mouth daily.     . simvastatin (ZOCOR) 40 MG tablet Take 1 tablet (40 mg total) by mouth daily. 90 tablet 3  . temazepam (RESTORIL) 7.5 MG capsule Take 1 capsule (7.5 mg total) by mouth at bedtime as needed. 90 capsule 1   No current facility-administered medications for this visit.     REVIEW OF SYSTEMS: see HPI for pertinent positives and negatives   Physical Examination  Vitals:   03/15/17 1352  BP: 136/78  Pulse: 64  Resp: 14  Temp: 97.7 F (36.5 C)  TempSrc: Oral  SpO2: 98%  Weight: 166 lb (75.3 kg)  Height: 5\' 11"  (1.803 m)   Body mass index is 23.15  kg/m.  General: The patient appears his  stated age.   HEENT:  No gross abnormalities Pulmonary: Respirations are non-labored, CTAB Abdomen: Soft and non-tender with normal pitched bowel sounds.  Musculoskeletal: There are no major deformities.   Neurologic: No focal weakness or paresthesias are detected. Negative bilateral SLR.  Skin: There are no ulcers or rashes noted. Psychiatric: The patient has normal affect. Cardiovascular: There is a regular rate and rhythm without significant murmur appreciated. Multiple varicosities both lower legs, few in both thighs,   Vascular: Vessel Right Left  Radial 2+Palpable 2+Palpable  Carotid  without bruit  without bruit  Aorta 2+ palpable N/A  Femoral 2+Palpable 2+Palpable  Popliteal Not palpable 2+ palpable  PT Not Palpable Not Palpable  DP Not Palpable 2+Palpable     DATA  BLE Venous Duplex (Date: 03/15/2017):  Reflux in several veins of both legs, no DVT.    Medical Decision Making  TERMAINE ROUPP is a 84 y.o. male who had a 4 day hx of intermittent pain in both thighs and calves, left worse than right, which ended 13 days ago. He has never experienced this type of pain before this.  He has multiple varicosities in both legs.  He has a known abdominal aortic aneurysm and has been followed with CT scan from 2017 for the aneurysm; was 4.7 cm.  ABI's in May of 2017 showed 0.78 on the right with monophasic waveforms, 1.05 on the left with biphasic waveforms.    I discussed with Dr. Bridgett Larsson pt HPI and no DVT in either leg on venous study today. He has no signs of ischemia in his lower extremities, ABI in May of 2017 showed 0.78 on the right, 100% on the left.  The pain pt describes is not venous or arterial in origin. I advised pt to return to his PCP, consider evaluation of lumbar spine, or muscoskeletal origin.  He will return as scheduled in May 2019 with AAA duplex and see Dr. Donzetta Matters.     Clemon Chambers, RN, MSN, FNP-C [facility] Office: [phone]  03/15/2017, 2:03 PM  Clinic MD: Bridgett Larsson

## 2017-03-15 NOTE — Patient Instructions (Signed)
Varicose Veins Varicose veins are veins that have become enlarged and twisted. They are usually seen in the legs but can occur in other parts of the body as well. What are the causes? This condition is the result of valves in the veins not working properly. Valves in the veins help to return blood from the leg to the heart. If these valves are damaged, blood flows backward and backs up into the veins in the leg near the skin. This causes the veins to become larger. What increases the risk? People who are on their feet a lot, who are pregnant, or who are overweight are more likely to develop varicose veins. What are the signs or symptoms?  Bulging, twisted-appearing, bluish veins, most commonly found on the legs.  Leg pain or a feeling of heaviness. These symptoms may be worse at the end of the day.  Leg swelling.  Changes in skin color. How is this diagnosed? A health care provider can usually diagnose varicose veins by examining your legs. Your health care provider may also recommend an ultrasound of your leg veins. How is this treated? Most varicose veins can be treated at home.However, other treatments are available for people who have persistent symptoms or want to improve the cosmetic appearance of the varicose veins. These treatment options include:  Sclerotherapy. A solution is injected into the vein to close it off.  Laser treatment. A laser is used to heat the vein to close it off.  Radiofrequency vein ablation. An electrical current produced by radio waves is used to close off the vein.  Phlebectomy. The vein is surgically removed through small incisions made over the varicose vein.  Vein ligation and stripping. The vein is surgically removed through incisions made over the varicose vein after the vein has been tied (ligated). Follow these instructions at home:   Do not stand or sit in one position for long periods of time. Do not sit with your legs crossed. Rest with your  legs raised during the day.  Wear compression stockings as directed by your health care provider. These stockings help to prevent blood clots and reduce swelling in your legs.  Do not wear other tight, encircling garments around your legs, pelvis, or waist.  Walk as much as possible to increase blood flow.  Raise the foot of your bed at night with 2-inch blocks.  If you get a cut in the skin over the vein and the vein bleeds, lie down with your leg raised and press on it with a clean cloth until the bleeding stops. Then place a bandage (dressing) on the cut. See your health care provider if it continues to bleed. Contact a health care provider if:  The skin around your ankle starts to break down.  You have pain, redness, tenderness, or hard swelling in your leg over a vein.  You are uncomfortable because of leg pain. This information is not intended to replace advice given to you by your health care provider. Make sure you discuss any questions you have with your health care provider. Document Released: 11/15/2004 Document Revised: 07/14/2015 Document Reviewed: 08/09/2015 Elsevier Interactive Patient Education  2017 Elsevier Inc.  

## 2017-03-19 ENCOUNTER — Encounter: Payer: Self-pay | Admitting: Internal Medicine

## 2017-03-19 LAB — VITAMIN D 25 HYDROXY (VIT D DEFICIENCY, FRACTURES): Vit D, 25-Hydroxy: 33.73

## 2017-03-19 LAB — HEMOGLOBIN A1C: Hemoglobin A1C: 6.3

## 2017-03-23 ENCOUNTER — Other Ambulatory Visit: Payer: Self-pay | Admitting: Internal Medicine

## 2017-03-27 ENCOUNTER — Other Ambulatory Visit: Payer: Self-pay | Admitting: Internal Medicine

## 2017-03-28 NOTE — Patient Instructions (Addendum)
   All other Health Maintenance issues reviewed.   All recommended immunizations and age-appropriate screenings are up-to-date or discussed.  No immunizations administered today.   Medications reviewed and updated.  No changes recommended at this time.    Please followup in 6 months   

## 2017-03-28 NOTE — Progress Notes (Signed)
Subjective:    Patient ID: Brian [name], male    DOB: 08-26-1931, 84 y.o.   MRN: [id number]  HPI The patient is here for follow up.  Diabetes: He is controlling his sugars with diet. He is compliant with a diabetic diet. He is exercising regularly. He monitors his sugars and they have been running 122. He checks his feet daily and denies foot lesions. He is up-to-date with an ophthalmology examination.   COPD:  He uses his albuterol as needed.  Some days he needs it 3 times and some days none.  He has wheeze and SOB with activity sometimes.    CAD, Hyperlipidemia: He is taking his medication daily.  He denies any chest pain, palpitations and shortness of breath.  He is compliant with a low fat/cholesterol diet. He is exercising regularly. He denies myalgias.    Sleep difficulties:  He is taking restoril as needed.  Medication will provide approximately 5 hours of sleep.  He does not take it on a regular basis.  Leg pain;  He saw vascular and his symptoms are no vascular in nature.  His legs were very sensitive to touch - his upper leg and lower legs.  He did see his [place] doctor and they did xrays of his back and hip and there were no concerns.  The sensitivity resolved.  He is taking tylenol.    Medications and allergies reviewed with patient and updated if appropriate.  Patient Active Problem List   Diagnosis Date Noted  . Vertigo 07/17/2016  . Nocturnal leg cramps 09/20/2015  . COPD (chronic obstructive pulmonary disease) ([place]) 05/31/2015  . CKD (chronic kidney disease) stage 3, GFR 30-59 ml/min ([facility]) 05/31/2015  . Diabetes ([place]) 04/26/2015  . Epistaxis 04/05/2015  . AAA (abdominal aortic aneurysm) without rupture ([place]) 01/18/2015  . PAD (peripheral artery disease) ([place]) 01/18/2015  . Myalgia and myositis 07/02/2014  . Hereditary and idiopathic peripheral neuropathy 10/29/2012  . Hypertension 05/10/2011  . Bradycardia 05/10/2011  . CAD (coronary artery disease) 09/05/2010  .  TINNITUS 04/04/2010  . CORONARY ARTERY BYPASS GRAFT, HX OF 03/31/2010  . Disturbance in sleep behavior 12/28/2008  . Cervicalgia 07/26/2008  . POLYP, COLON 07/16/2007  . Hyperlipidemia 09/24/2006  . History of gout 09/23/2006    Current Outpatient Medications on File Prior to Visit  Medication Sig Dispense Refill  . ACCU-CHEK COMPACT STRIPS test strip TEST BLOOD SUGAR ONCE DAILY 100 each 1  . albuterol (PROVENTIL HFA;VENTOLIN HFA) 108 (90 Base) MCG/ACT inhaler Inhale 2 puffs into the lungs every 6 (six) hours as needed for wheezing or shortness of breath.     . Aspirin (ADULT ASPIRIN LOW STRENGTH) 81 MG EC tablet Take 81 mg by mouth daily.      . cetirizine (ZYRTEC) 10 MG tablet Take 10 mg by mouth daily as needed.     . cholecalciferol (VITAMIN D) 1000 UNITS tablet Take 3,000 Units by mouth daily.     . enalapril (VASOTEC) 10 MG tablet TAKE ONE AND ONE-HALF TABLETS DAILY 135 tablet 1  . Magnesium Oxide 420 MG TABS Take 420 mg by mouth daily.     . meclizine (ANTIVERT) 25 MG tablet Take 25 mg by mouth 3 (three) times daily as needed for dizziness.    . metoprolol succinate (TOPROL-XL) 25 MG 24 hr tablet TAKE ONE-HALF (1/2) TABLET DAILY 45 tablet 1  . Multiple Vitamins-Minerals (MULTIVITAL PO) Take 1 tablet by mouth daily.     . simvastatin (ZOCOR) 40  MG tablet TAKE 1 TABLET DAILY 90 tablet 1  . temazepam (RESTORIL) 7.5 MG capsule Take 1 capsule (7.5 mg total) by mouth at bedtime as needed. 90 capsule 1   No current facility-administered medications on file prior to visit.     Past Medical History:  Diagnosis Date  . AAA (abdominal aortic aneurysm) ([place])   . Asthma 1976  . Bronchiolitis May 2016  . CAD (coronary artery disease)    NSTEMI 02/2010;  s/p CABG 1/12 (L-LAD, SOM1, S-PDA); echo in 08/2010: Mild LVH, EF 17%, grade 2 diastolic dysfunction, mild MR, mild LAE, mildly reduced RV function, mild RAE, PASP 42  . Colon polyps   . COPD (chronic obstructive pulmonary disease) ([place])     . Diabetes mellitus 2000   adult onset  . ED (erectile dysfunction)   . Gout   . HTN (hypertension)   . Hyperlipidemia   . Macular degeneration   . Myocardial infarction ([place]) 1 -13-2012  . Neuropathy     Past Surgical History:  Procedure Laterality Date  . 3 vessel CABG  03/03/2010  . cataract left eye    . COLONOSCOPY     neg 2009  . COLONOSCOPY  2014   5 mm sessile polyp; AVM  . CORONARY ARTERY BYPASS GRAFT  2012   X3  . EYE SURGERY    . INCISION AND DRAINAGE PERIRECTAL ABSCESS    . INGUINAL HERNIA REPAIR      Social History   Socioeconomic History  . Marital status: Widowed    Spouse name: None  . Number of children: None  . Years of education: None  . Highest education level: None  Social Needs  . Financial resource strain: None  . Food insecurity - worry: None  . Food insecurity - inability: None  . Transportation needs - medical: None  . Transportation needs - non-medical: None  Occupational History  . Occupation: RETIRED    Employer: RETIRED  Tobacco Use  . Smoking status: Former Smoker    Types: Cigarettes    Last attempt to quit: 02/20/2003    Years since quitting: 14.1  . Smokeless tobacco: Never Used  Substance and Sexual Activity  . Alcohol use: Yes    Alcohol/week: 3.6 oz    Types: 3 Glasses of wine, 3 Shots of liquor per week    Comment:  1-3 drinks a week  . Drug use: No  . Sexual activity: None  Other Topics Concern  . None  Social History Narrative   Quit smoking in 1998. 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     Family History  Problem Relation Age of Onset  . Heart failure Mother   . Heart disease Mother        CHF  . Heart attack Mother   . Hypertension Mother   . Heart attack Father   . Heart disease Father 66       MI  . Hypertension Father   . Heart attack Sister 22  . Heart disease Sister 78       MI  . Diabetes Sister   . Hyperlipidemia Sister   . Hypertension Sister   . Varicose Veins Sister   . Heart attack  Brother 91  . Diabetes Brother   . Heart disease Brother 40       MI  . Cancer Brother   . Hypertension Brother   . Deep vein thrombosis Daughter   . Other Son  varicose veins  . Colon cancer Neg Hx     Review of Systems  Constitutional: Negative for chills and fever.  Respiratory: Negative for cough, shortness of breath and wheezing.   Cardiovascular: Negative for chest pain, palpitations and leg swelling.  Neurological: Negative for light-headedness and headaches.       Objective:   Vitals:   03/29/17 0847  BP: 130/60  Pulse: 63  Resp: 16  Temp: 97.8 F (36.6 C)  SpO2: 95%   Wt Readings from Last 3 Encounters:  03/29/17 170 lb (77.1 kg)  03/15/17 166 lb (75.3 kg)  09/26/16 173 lb (78.5 kg)   Body mass index is 23.71 kg/m.   Physical Exam    Constitutional: Appears well-developed and well-nourished. No distress.  HENT:  Head: Normocephalic and atraumatic.  Neck: Neck supple. No tracheal deviation present. No thyromegaly present.  No cervical lymphadenopathy Cardiovascular: Normal rate, regular rhythm and normal heart sounds.   No murmur heard. No carotid bruit .  No edema Pulmonary/Chest: Effort normal and breath sounds normal. No respiratory distress. No has no wheezes. No rales.  Skin: Skin is warm and dry. Not diaphoretic.  Psychiatric: Normal mood and affect. Behavior is normal.      Assessment & Plan:    See Problem List for Assessment and Plan of chronic medical problems.

## 2017-03-29 ENCOUNTER — Encounter: Payer: Self-pay | Admitting: Internal Medicine

## 2017-03-29 ENCOUNTER — Ambulatory Visit (INDEPENDENT_AMBULATORY_CARE_PROVIDER_SITE_OTHER): Payer: Medicare Other | Admitting: Internal Medicine

## 2017-03-29 VITALS — BP 130/60 | HR 63 | Temp 97.8°F | Resp 16 | Ht 71.0 in | Wt 170.0 lb

## 2017-03-29 DIAGNOSIS — J439 Emphysema, unspecified: Secondary | ICD-10-CM | POA: Diagnosis not present

## 2017-03-29 DIAGNOSIS — E1122 Type 2 diabetes mellitus with diabetic chronic kidney disease: Secondary | ICD-10-CM

## 2017-03-29 DIAGNOSIS — G479 Sleep disorder, unspecified: Secondary | ICD-10-CM | POA: Diagnosis not present

## 2017-03-29 DIAGNOSIS — N183 Chronic kidney disease, stage 3 unspecified: Secondary | ICD-10-CM

## 2017-03-29 DIAGNOSIS — E7849 Other hyperlipidemia: Secondary | ICD-10-CM | POA: Diagnosis not present

## 2017-03-29 DIAGNOSIS — N182 Chronic kidney disease, stage 2 (mild): Secondary | ICD-10-CM

## 2017-03-29 DIAGNOSIS — I10 Essential (primary) hypertension: Secondary | ICD-10-CM | POA: Diagnosis not present

## 2017-03-29 NOTE — Assessment & Plan Note (Signed)
Continue statin [facility] will have me send a copy Continue regular exercise and healthy diet

## 2017-03-29 NOTE — Assessment & Plan Note (Signed)
BP well controlled Current regimen effective and well tolerated Continue current medications at current doses Blood work is completed at the [place]

## 2017-03-29 NOTE — Assessment & Plan Note (Signed)
Blood work done at the [place] - he will have them send me a copy

## 2017-03-29 NOTE — Assessment & Plan Note (Signed)
Diet controlled Sugars well controlled at home Continuing regular exercise and healthy diet Blood work just completed at the [place]

## 2017-03-29 NOTE — Assessment & Plan Note (Signed)
Uses his inhaler as needed-typically needed with some activities, which can cause wheezing and shortness of breath Overall he feels this medication is working well and does not feel that he needs anything in addition to this

## 2017-03-29 NOTE — Assessment & Plan Note (Signed)
Taking temazepam as needed-he does not take it on a daily basis Medication provides him with approximately 5 hours of sleep We will continue

## 2017-04-09 ENCOUNTER — Encounter: Payer: Self-pay | Admitting: Internal Medicine

## 2017-04-25 ENCOUNTER — Encounter: Payer: Self-pay | Admitting: Internal Medicine

## 2017-05-03 ENCOUNTER — Ambulatory Visit (INDEPENDENT_AMBULATORY_CARE_PROVIDER_SITE_OTHER): Payer: Medicare Other | Admitting: Podiatry

## 2017-05-03 ENCOUNTER — Ambulatory Visit (INDEPENDENT_AMBULATORY_CARE_PROVIDER_SITE_OTHER): Payer: Medicare Other

## 2017-05-03 ENCOUNTER — Encounter: Payer: Self-pay | Admitting: Podiatry

## 2017-05-03 ENCOUNTER — Other Ambulatory Visit: Payer: Self-pay | Admitting: Podiatry

## 2017-05-03 DIAGNOSIS — M779 Enthesopathy, unspecified: Secondary | ICD-10-CM

## 2017-05-03 DIAGNOSIS — M1000 Idiopathic gout, unspecified site: Secondary | ICD-10-CM | POA: Diagnosis not present

## 2017-05-03 DIAGNOSIS — M79671 Pain in right foot: Secondary | ICD-10-CM

## 2017-05-03 MED ORDER — TRIAMCINOLONE ACETONIDE 10 MG/ML IJ SUSP
10.0000 mg | Freq: Once | INTRAMUSCULAR | Status: AC
  Administered 2017-05-03: 10 mg

## 2017-05-03 NOTE — Patient Instructions (Signed)

## 2017-05-03 NOTE — Progress Notes (Signed)
Subjective:   Patient ID: Brian [name], male   DOB: 84 y.o.   MRN: [id number]   HPI Patient states he woke up the night before with redness and pain around the first metatarsal head right and does not remember specific injury.  States is been very sore and making it hard to walk or wear shoe gear   ROS      Objective:  Physical Exam  Neurovascular status intact with inflammation pain around the first MPJ right with redness and fluid buildup noted     Assessment:  Inflammatory capsulitis first MPJ right with possibility of gout-like symptomatology and mild structural bunion     Plan:  H&P conditions reviewed and carefully injected around the first MPJ right 3 mg Kenalog 5 mg Xylocaine advised on soaks anti-inflammatories and gave him all instructions on gout and educated him on gout and his symptoms were to occur again we will need to treat consider treating gout for this problem

## 2017-05-27 NOTE — Progress Notes (Signed)
Subjective:    Patient ID: Brian [name], male    DOB: 12-23-1931, 84 y.o.   MRN: [id number]  HPI The patient is here for an acute visit.   Pain under left rib: It is a dull, deep pain and is intermittent in the let middle abdomen. The intensity of pain is 4/10 and typically lasts 10 seconds.  He denies injury or activity that may have caused it.  It started three weeks ago.  It is not getting worse.  He typically can feel it 2-3 times a day - it only occurred once yesterday.  There is no pattern to the pain - it is not related to any specific activity, eating or movement.     He denies fever, change in appetite or weight, changes in bowels or urine.     Medications and allergies reviewed with patient and updated if appropriate.  Patient Active Problem List   Diagnosis Date Noted  . Vertigo 07/17/2016  . Nocturnal leg cramps 09/20/2015  . COPD (chronic obstructive pulmonary disease) ([place]) 05/31/2015  . CKD (chronic kidney disease) stage 3, GFR 30-59 ml/min (HCC) 05/31/2015  . Diabetes ([place]) 04/26/2015  . Epistaxis 04/05/2015  . AAA (abdominal aortic aneurysm) without rupture ([place]) 01/18/2015  . PAD (peripheral artery disease) ([place]) 01/18/2015  . Myalgia and myositis 07/02/2014  . Hereditary and idiopathic peripheral neuropathy 10/29/2012  . Hypertension 05/10/2011  . Bradycardia 05/10/2011  . CAD (coronary artery disease) 09/05/2010  . TINNITUS 04/04/2010  . CORONARY ARTERY BYPASS GRAFT, HX OF 03/31/2010  . Disturbance in sleep behavior 12/28/2008  . Cervicalgia 07/26/2008  . POLYP, COLON 07/16/2007  . Hyperlipidemia 09/24/2006  . History of gout 09/23/2006    Current Outpatient Medications on File Prior to Visit  Medication Sig Dispense Refill  . ACCU-CHEK COMPACT STRIPS test strip TEST BLOOD SUGAR ONCE DAILY 100 each 1  . albuterol (PROVENTIL HFA;VENTOLIN HFA) 108 (90 Base) MCG/ACT inhaler Inhale 2 puffs into the lungs every 4 (four) hours as needed for wheezing or  shortness of breath.     . Aspirin (ADULT ASPIRIN LOW STRENGTH) 81 MG EC tablet Take 81 mg by mouth daily.      . cetirizine (ZYRTEC) 10 MG tablet Take 10 mg by mouth daily as needed.     . cholecalciferol (VITAMIN D) 1000 UNITS tablet Take 3,000 Units by mouth daily.     . enalapril (VASOTEC) 10 MG tablet TAKE ONE AND ONE-HALF TABLETS DAILY 135 tablet 1  . Magnesium Oxide 420 MG TABS Take 420 mg by mouth daily.     . meclizine (ANTIVERT) 25 MG tablet Take 25 mg by mouth 3 (three) times daily as needed for dizziness.    . metoprolol succinate (TOPROL-XL) 25 MG 24 hr tablet TAKE ONE-HALF (1/2) TABLET DAILY 45 tablet 1  . Multiple Vitamins-Minerals (MULTIVITAL PO) Take 1 tablet by mouth daily.     . simvastatin (ZOCOR) 40 MG tablet TAKE 1 TABLET DAILY 90 tablet 1  . temazepam (RESTORIL) 7.5 MG capsule Take 1 capsule (7.5 mg total) by mouth at bedtime as needed. 90 capsule 1   No current facility-administered medications on file prior to visit.     Past Medical History:  Diagnosis Date  . AAA (abdominal aortic aneurysm) ([place])   . Asthma 1976  . Bronchiolitis May 2016  . CAD (coronary artery disease)    NSTEMI 02/2010;  s/p CABG 1/12 (L-LAD, SOM1, S-PDA); echo in 08/2010: Mild LVH, EF 18%, grade 2 diastolic  dysfunction, mild MR, mild LAE, mildly reduced RV function, mild RAE, PASP 42  . Colon polyps   . COPD (chronic obstructive pulmonary disease) ([place])   . Diabetes mellitus 2000   adult onset  . ED (erectile dysfunction)   . Gout   . HTN (hypertension)   . Hyperlipidemia   . Macular degeneration   . Myocardial infarction ([place]) 1 -13-2012  . Neuropathy     Past Surgical History:  Procedure Laterality Date  . 3 vessel CABG  03/03/2010  . cataract left eye    . COLONOSCOPY     neg 2009  . COLONOSCOPY  2014   5 mm sessile polyp; AVM  . CORONARY ARTERY BYPASS GRAFT  2012   X3  . EYE SURGERY    . INCISION AND DRAINAGE PERIRECTAL ABSCESS    . INGUINAL HERNIA REPAIR      Social  History   Socioeconomic History  . Marital status: Widowed    Spouse name: Not on file  . Number of children: Not on file  . Years of education: Not on file  . Highest education level: Not on file  Occupational History  . Occupation: RETIRED    Employer: RETIRED  Social Needs  . Financial resource strain: Not on file  . Food insecurity:    Worry: Not on file    Inability: Not on file  . Transportation needs:    Medical: Not on file    Non-medical: Not on file  Tobacco Use  . Smoking status: Former Smoker    Types: Cigarettes    Last attempt to quit: 02/20/2003    Years since quitting: 14.2  . Smokeless tobacco: Never Used  Substance and Sexual Activity  . Alcohol use: Yes    Alcohol/week: 3.6 oz    Types: 3 Glasses of wine, 3 Shots of liquor per week    Comment:  1-3 drinks a week  . Drug use: No  . Sexual activity: Not on file  Lifestyle  . Physical activity:    Days per week: Not on file    Minutes per session: Not on file  . Stress: Not on file  Relationships  . Social connections:    Talks on phone: Not on file    Gets together: Not on file    Attends religious service: Not on file    Active member of club or organization: Not on file    Attends meetings of clubs or organizations: Not on file    Relationship status: Not on file  Other Topics Concern  . 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     Family History  Problem Relation Age of Onset  . Heart failure Mother   . Heart disease Mother        CHF  . Heart attack Mother   . Hypertension Mother   . Heart attack Father   . Heart disease Father 49       MI  . Hypertension Father   . Heart attack Sister 44  . Heart disease Sister 46       MI  . Diabetes Sister   . Hyperlipidemia Sister   . Hypertension Sister   . Varicose Veins Sister   . Heart attack Brother 25  . Diabetes Brother   . Heart disease Brother 31       MI  . Cancer  Brother   . Hypertension Brother   .  Deep vein thrombosis Daughter   . Other Son        varicose veins  . Colon cancer Neg Hx     Review of Systems  Constitutional: Negative for appetite change, chills, fatigue, fever and unexpected weight change.  Respiratory: Positive for wheezing (from COPD/asthma). Negative for cough and shortness of breath.   Cardiovascular: Positive for palpitations (occ, not new). Negative for chest pain and leg swelling.  Gastrointestinal: Positive for abdominal pain. Negative for blood in stool, constipation, diarrhea and nausea.       No gerd  Genitourinary: Negative for dysuria and hematuria.  Musculoskeletal: Negative for back pain.  Skin: Negative for rash.  Neurological: Negative for light-headedness and headaches.       Objective:   Vitals:   05/28/17 1025  BP: (!) 164/74  Pulse: (!) 59  Resp: 16  Temp: 98.1 F (36.7 C)  SpO2: 92%   BP Readings from Last 3 Encounters:  05/28/17 (!) 164/74  03/29/17 130/60  03/15/17 136/78   Wt Readings from Last 3 Encounters:  05/28/17 172 lb (78 kg)  03/29/17 170 lb (77.1 kg)  03/15/17 166 lb (75.3 kg)   Body mass index is 23.99 kg/m.   Physical Exam  Constitutional: He is oriented to person, place, and time. He appears well-developed and well-nourished. No distress.  HENT:  Head: Normocephalic and atraumatic.  Eyes: Conjunctivae are normal.  Cardiovascular: Normal rate, regular rhythm and normal heart sounds.  No murmur heard. Pulmonary/Chest: Effort normal and breath sounds normal. No respiratory distress. He has no wheezes. He has no rales.  Abdominal: Soft. He exhibits no distension and no mass. There is tenderness (just left and above umbilicus). There is no rebound and no guarding. No hernia.  Musculoskeletal: He exhibits no edema.  Neurological: He is alert and oriented to person, place, and time.  Skin: Skin is warm and dry. No rash noted. He is not diaphoretic. No erythema.            Assessment & Plan:    See Problem List for Assessment and Plan of chronic medical problems.

## 2017-05-28 ENCOUNTER — Other Ambulatory Visit (INDEPENDENT_AMBULATORY_CARE_PROVIDER_SITE_OTHER): Payer: Medicare Other

## 2017-05-28 ENCOUNTER — Ambulatory Visit (INDEPENDENT_AMBULATORY_CARE_PROVIDER_SITE_OTHER): Payer: Medicare Other | Admitting: Internal Medicine

## 2017-05-28 ENCOUNTER — Encounter: Payer: Self-pay | Admitting: Internal Medicine

## 2017-05-28 VITALS — BP 164/74 | HR 59 | Temp 98.1°F | Resp 16 | Wt 172.0 lb

## 2017-05-28 DIAGNOSIS — R109 Unspecified abdominal pain: Secondary | ICD-10-CM

## 2017-05-28 LAB — COMPREHENSIVE METABOLIC PANEL
ALK PHOS: 59 U/L (ref 39–117)
ALT: 13 U/L (ref 0–53)
AST: 17 U/L (ref 0–37)
Albumin: 4.2 g/dL (ref 3.5–5.2)
BILIRUBIN TOTAL: 0.7 mg/dL (ref 0.2–1.2)
BUN: 20 mg/dL (ref 6–23)
CO2: 30 mEq/L (ref 19–32)
CREATININE: 1.37 mg/dL (ref 0.40–1.50)
Calcium: 9.3 mg/dL (ref 8.4–10.5)
Chloride: 102 mEq/L (ref 96–112)
GFR: 63.77 mL/min (ref >60.00)
GLUCOSE: 132 mg/dL — AB (ref 70–99)
Potassium: 4.3 mEq/L (ref 3.5–5.1)
Sodium: 138 mEq/L (ref 135–145)
TOTAL PROTEIN: 7.7 g/dL (ref 6.0–8.3)

## 2017-05-28 LAB — CBC WITH DIFFERENTIAL/PLATELET
BASOS ABS: 0.1 10*3/uL (ref 0.0–0.1)
Basophils Relative: 1.1 % (ref 0.0–3.0)
EOS ABS: 0.2 10*3/uL (ref 0.0–0.7)
Eosinophils Relative: 4.4 % (ref 0.0–5.0)
HCT: 42.2 % (ref 39.0–52.0)
Hemoglobin: 14.2 g/dL (ref 13.0–17.0)
LYMPHS ABS: 1.7 10*3/uL (ref 0.7–4.0)
Lymphocytes Relative: 31.7 % (ref 12.0–46.0)
MCHC: 33.6 g/dL (ref 30.0–36.0)
MCV: 92.7 fl (ref 78.0–100.0)
MONO ABS: 0.6 10*3/uL (ref 0.1–1.0)
Monocytes Relative: 11.5 % (ref 3.0–12.0)
NEUTROS PCT: 51.3 % (ref 43.0–77.0)
Neutro Abs: 2.8 10*3/uL (ref 1.4–7.7)
Platelets: 190 10*3/uL (ref 150.0–400.0)
RBC: 4.55 Mil/uL (ref 4.22–5.81)
RDW: 13.2 % (ref 11.5–15.5)
WBC: 5.5 10*3/uL (ref 4.0–10.5)

## 2017-05-28 LAB — AMYLASE: AMYLASE: 60 U/L (ref 27–131)

## 2017-05-28 LAB — LIPASE: Lipase: 32 U/L (ref 11.0–59.0)

## 2017-05-28 NOTE — Patient Instructions (Addendum)
An abdominal ultrasound was ordered -someone will call you to schedule this and we will call you with the results.  We may need to get a Ct Scan done.    Have blood work done today.     If your pain worsens please call.

## 2017-05-28 NOTE — Assessment & Plan Note (Signed)
3 weeks of LMQ pain - deep, dull pain lasts 10 sec, unrelated to anything and no associated symptoms Tender on exam above and left of umbilicus [place] of abdomen ordered --- may need to get a Ct scan Check labs - cbc, cmp, amylase and lipase

## 2017-06-04 ENCOUNTER — Ambulatory Visit
Admission: RE | Admit: 2017-06-04 | Discharge: 2017-06-04 | Disposition: A | Discharge status: Home / Self Care | Payer: Medicare Other | Source: Ambulatory Visit | Attending: Internal Medicine | Admitting: Internal Medicine

## 2017-06-04 DIAGNOSIS — R109 Unspecified abdominal pain: Secondary | ICD-10-CM | POA: Diagnosis not present

## 2017-07-05 ENCOUNTER — Encounter (HOSPITAL_COMMUNITY): Payer: Medicare Other

## 2017-07-05 ENCOUNTER — Encounter: Payer: Self-pay | Admitting: Vascular Surgery

## 2017-07-05 ENCOUNTER — Other Ambulatory Visit: Payer: Self-pay

## 2017-07-05 ENCOUNTER — Other Ambulatory Visit (HOSPITAL_COMMUNITY): Payer: Medicare Other

## 2017-07-05 ENCOUNTER — Ambulatory Visit (HOSPITAL_COMMUNITY)
Admission: RE | Admit: 2017-07-05 | Discharge: 2017-07-05 | Disposition: A | Discharge status: Home / Self Care | Payer: Medicare Other | Source: Ambulatory Visit | Attending: Vascular Surgery | Admitting: Vascular Surgery

## 2017-07-05 ENCOUNTER — Ambulatory Visit (INDEPENDENT_AMBULATORY_CARE_PROVIDER_SITE_OTHER): Payer: Medicare Other | Admitting: Vascular Surgery

## 2017-07-05 VITALS — BP 158/76 | HR 61 | Temp 97.4°F | Resp 16 | Ht 71.0 in | Wt 172.0 lb

## 2017-07-05 DIAGNOSIS — I714 Abdominal aortic aneurysm, without rupture, unspecified: Secondary | ICD-10-CM

## 2017-07-05 DIAGNOSIS — I872 Venous insufficiency (chronic) (peripheral): Secondary | ICD-10-CM | POA: Diagnosis not present

## 2017-07-05 DIAGNOSIS — I83021 Varicose veins of left lower extremity with ulcer of thigh: Secondary | ICD-10-CM | POA: Diagnosis not present

## 2017-07-05 DIAGNOSIS — L97121 Non-pressure chronic ulcer of left thigh limited to breakdown of skin: Secondary | ICD-10-CM

## 2017-07-05 DIAGNOSIS — L97929 Non-pressure chronic ulcer of unspecified part of left lower leg with unspecified severity: Secondary | ICD-10-CM

## 2017-07-05 DIAGNOSIS — I83029 Varicose veins of left lower extremity with ulcer of unspecified site: Secondary | ICD-10-CM | POA: Diagnosis not present

## 2017-07-05 NOTE — Progress Notes (Signed)
Patient ID: Brian [name], male   DOB: 1931/12/27, 84 y.o.   MRN: [id number]  Reason for Consult: Aneurysm (1 yr f/u AAA. LE reflux bilat.  Pt had Abdominal US on 06/04/17 at [facility].)   Referred by Binnie Rail, MD  Subjective:     HPI:  Brian [name] is a 84 y.o. male with hypertension hyperlipidemia and diabetes with known abdominal aortic aneurysm.  He recently had abdominal pain was sent for ultrasound which showed his aneurysm to be stable at 4.9 cm.  He was then to follow-up with [place] with a repeat ultrasound but that was canceled given his recent abdominal evaluation.  His belly pain has been intermittent and somewhat in the right upper quadrant and he is being worked up for his gallbladder with possible gallstones.  He is not having any new back pain at this time.  He did have some left lower extremity pain at his last visit but that has resolved as well.  Prior to our evaluation today he did undergo a lower extremity venous reflux studies.  Past Medical History:  Diagnosis Date  . AAA (abdominal aortic aneurysm) ([place])   . Asthma 1976  . Bronchiolitis May 2016  . CAD (coronary artery disease)    NSTEMI 02/2010;  s/p CABG 1/12 (L-LAD, SOM1, S-PDA); echo in 08/2010: Mild LVH, EF 64%, grade 2 diastolic dysfunction, mild MR, mild LAE, mildly reduced RV function, mild RAE, PASP 42  . Colon polyps   . COPD (chronic obstructive pulmonary disease) ([place])   . Diabetes mellitus 2000   adult onset  . ED (erectile dysfunction)   . Gout   . HTN (hypertension)   . Hyperlipidemia   . Macular degeneration   . Myocardial infarction ([place]) 1 -13-2012  . Neuropathy    Family History  Problem Relation Age of Onset  . Heart failure Mother   . Heart disease Mother        CHF  . Heart attack Mother   . Hypertension Mother   . Heart attack Father   . Heart disease Father 56       MI  . Hypertension Father   . Heart attack Sister 46  . Heart disease Sister 16       MI  .  Diabetes Sister   . Hyperlipidemia Sister   . Hypertension Sister   . Varicose Veins Sister   . Heart attack Brother 51  . Diabetes Brother   . Heart disease Brother 32       MI  . Cancer Brother   . Hypertension Brother   . Deep vein thrombosis Daughter   . Other Son        varicose veins  . Colon cancer Neg Hx    Past Surgical History:  Procedure Laterality Date  . 3 vessel CABG  03/03/2010  . cataract left eye    . COLONOSCOPY     neg 2009  . COLONOSCOPY  2014   5 mm sessile polyp; AVM  . CORONARY ARTERY BYPASS GRAFT  2012   X3  . EYE SURGERY    . INCISION AND DRAINAGE PERIRECTAL ABSCESS    . INGUINAL HERNIA REPAIR      Short Social History:  Social History   Tobacco Use  . Smoking status: Former Smoker    Types: Cigarettes    Last attempt to quit: 02/20/2003    Years since quitting: 14.3  . Smokeless tobacco: Never Used  Substance Use  Topics  . Alcohol use: Yes    Alcohol/week: 3.6 oz    Types: 3 Glasses of wine, 3 Shots of liquor per week    Comment:  1-3 drinks a week    Allergies  Allergen Reactions  . Meperidine Hcl     Tongue swelling Because of a history of documented adverse serious drug reaction;Medi Alert bracelet  is recommended  . Zolpidem Tartrate     REACTION: Crazy dreams    Current Outpatient Medications  Medication Sig Dispense Refill  . ACCU-CHEK COMPACT STRIPS test strip TEST BLOOD SUGAR ONCE DAILY 100 each 1  . albuterol (PROVENTIL HFA;VENTOLIN HFA) 108 (90 Base) MCG/ACT inhaler Inhale 2 puffs into the lungs every 4 (four) hours as needed for wheezing or shortness of breath.     . Aspirin (ADULT ASPIRIN LOW STRENGTH) 81 MG EC tablet Take 81 mg by mouth daily.      . cetirizine (ZYRTEC) 10 MG tablet Take 10 mg by mouth daily as needed.     . cholecalciferol (VITAMIN D) 1000 UNITS tablet Take 3,000 Units by mouth daily.     . enalapril (VASOTEC) 10 MG tablet TAKE ONE AND ONE-HALF TABLETS DAILY 135 tablet 1  . Magnesium Oxide 420 MG  TABS Take 420 mg by mouth daily.     . meclizine (ANTIVERT) 25 MG tablet Take 25 mg by mouth 3 (three) times daily as needed for dizziness.    . metoprolol succinate (TOPROL-XL) 25 MG 24 hr tablet TAKE ONE-HALF (1/2) TABLET DAILY 45 tablet 1  . Multiple Vitamins-Minerals (MULTIVITAL PO) Take 1 tablet by mouth daily.     . simvastatin (ZOCOR) 40 MG tablet TAKE 1 TABLET DAILY 90 tablet 1  . temazepam (RESTORIL) 7.5 MG capsule Take 1 capsule (7.5 mg total) by mouth at bedtime as needed. 90 capsule 1   No current facility-administered medications for this visit.     Review of Systems  Constitutional:  Constitutional negative. HENT: HENT negative.  Eyes: Eyes negative.  Cardiovascular: Positive for leg swelling.  GI: Positive for abdominal pain.  Musculoskeletal: Positive for leg pain.  Neurological: Neurological negative. Hematologic: Hematologic/lymphatic negative.  Psychiatric: Psychiatric negative.        Objective:  Objective   Vitals:   07/05/17 1030  BP: (!) 158/76  Pulse: 61  Resp: 16  Temp: (!) 97.4 F (36.3 C)  TempSrc: Oral  SpO2: 93%  Weight: 172 lb (78 kg)  Height: 5\' 11"  (1.803 m)   Body mass index is 23.99 kg/m.  Physical Exam  Constitutional: He is oriented to person, place, and time. He appears well-developed.  HENT:  Head: Normocephalic.  Eyes: Pupils are equal, round, and reactive to light.  Neck: Normal range of motion. Neck supple.  Cardiovascular: Normal rate.  Pulses:      Carotid pulses are 2+ on the right side, and 2+ on the left side.      Radial pulses are 2+ on the right side, and 2+ on the left side.       Femoral pulses are 2+ on the right side, and 2+ on the left side.      Popliteal pulses are 0 on the right side, and 2+ on the left side.  Pulmonary/Chest: Effort normal.  Musculoskeletal: He exhibits edema.  Varicosities clustered left anterior leg with spider veins of the left ankle  Neurological: He is alert and oriented to person,  place, and time.  Skin: Skin is warm and dry.  Psychiatric: He  has a normal mood and affect. His behavior is normal. Judgment and thought content normal.    Data:  IMPRESSION: Question tiny nonshadowing calculi versus sludge dependently within gallbladder.  Nonvisualization of pancreas.  Distal abdominal aortic aneurysm 4.9 x 4.1 cm extending to aortic bifurcation.   I have independently interpreted her venous reflux study with the below findings.        Assessment/Plan:     84 year old male with known abdominal aortic aneurysm that is been slow-growing notably 4.4 cm back in 2015 and then by CT scan was 4.7 cm in 2017.  By duplex now is 4.9 cm this was done for abdominal pain that is intermittent and is possibly related to cholelithiasis.  He was having left lower extremity pain and does have reflux with see to venous disease but is without symptoms at this time.  From that standpoint we will not check his lower extremities again he does have palpable pulses on the left side probably has SFA disease on the right but not have claudication or rest pain or tissue loss.  We will follow him up in another 6 months with a dedicated abdominal aortic aneurysm duplex.  I discussed with him signs and symptoms of rupture for which she would need emergent evaluation.  We would need a CT scan to plan surgery if he has enlargement of his aneurysm particularly to 5.5 cm.  On previous CT scan he had a very short neck but possibly need complex endovascular versus open repair.  He demonstrates good understanding we will see him in another 6 months.     Waynetta Sandy MD [facility]

## 2017-07-09 ENCOUNTER — Telehealth: Payer: Self-pay | Admitting: Emergency Medicine

## 2017-07-09 NOTE — Telephone Encounter (Signed)
Called patient to schedule AWV. Patient declined at this time. 

## 2017-07-12 ENCOUNTER — Other Ambulatory Visit (HOSPITAL_COMMUNITY): Payer: Medicare Other

## 2017-07-12 ENCOUNTER — Ambulatory Visit: Payer: Medicare Other | Admitting: Vascular Surgery

## 2017-07-31 ENCOUNTER — Other Ambulatory Visit: Payer: Self-pay

## 2017-07-31 DIAGNOSIS — I714 Abdominal aortic aneurysm, without rupture, unspecified: Secondary | ICD-10-CM

## 2017-08-27 ENCOUNTER — Other Ambulatory Visit: Payer: Self-pay | Admitting: Internal Medicine

## 2017-08-31 ENCOUNTER — Other Ambulatory Visit: Payer: Self-pay | Admitting: Internal Medicine

## 2017-09-20 ENCOUNTER — Encounter: Payer: Self-pay | Admitting: Cardiovascular Disease

## 2017-09-20 ENCOUNTER — Ambulatory Visit (INDEPENDENT_AMBULATORY_CARE_PROVIDER_SITE_OTHER): Payer: Medicare Other | Admitting: Cardiovascular Disease

## 2017-09-20 VITALS — BP 132/60 | HR 61 | Ht 71.0 in | Wt 172.2 lb

## 2017-09-20 DIAGNOSIS — I25810 Atherosclerosis of coronary artery bypass graft(s) without angina pectoris: Secondary | ICD-10-CM

## 2017-09-20 DIAGNOSIS — E7800 Pure hypercholesterolemia, unspecified: Secondary | ICD-10-CM | POA: Diagnosis not present

## 2017-09-20 DIAGNOSIS — I10 Essential (primary) hypertension: Secondary | ICD-10-CM

## 2017-09-20 NOTE — Progress Notes (Signed)
Chief Complaint  Patient presents with  . Follow-up    CAD   History of Present Illness: 84 yo male with h/o HTN, hyperlipidemia, DM, COPD and CAD s/p CABG here today for cardiac follow up. He was admitted to [facility] January 2012 with a NSTEMI. Cardiac cath January 2012 with severe triple vessel CAD. He underwent 3V CABG on 03/03/10. AAA followed in [facility].  Echo March 2017 with normal LV size and function, mild MR.   He is here today for follow up. The patient denies any chest pain, palpitations, lower extremity edema, orthopnea, PND, dizziness, near syncope or syncope. He has chronic dyspnea due to his COPD.   Primary Care Physician: Binnie Rail, MD  Past Medical History:  Diagnosis Date  . AAA (abdominal aortic aneurysm) ([place])   . Asthma 1976  . Bronchiolitis May 2016  . CAD (coronary artery disease)    NSTEMI 02/2010;  s/p CABG 1/12 (L-LAD, SOM1, S-PDA); echo in 08/2010: Mild LVH, EF 58%, grade 2 diastolic dysfunction, mild MR, mild LAE, mildly reduced RV function, mild RAE, PASP 42  . Colon polyps   . COPD (chronic obstructive pulmonary disease) ([place])   . Diabetes mellitus 2000   adult onset  . ED (erectile dysfunction)   . Gout   . HTN (hypertension)   . Hyperlipidemia   . Macular degeneration   . Myocardial infarction ([place]) 1 -13-2012  . Neuropathy     Past Surgical History:  Procedure Laterality Date  . 3 vessel CABG  03/03/2010  . cataract left eye    . COLONOSCOPY     neg 2009  . COLONOSCOPY  2014   5 mm sessile polyp; AVM  . CORONARY ARTERY BYPASS GRAFT  2012   X3  . EYE SURGERY    . INCISION AND DRAINAGE PERIRECTAL ABSCESS    . INGUINAL HERNIA REPAIR      Current Outpatient Medications  Medication Sig Dispense Refill  . ACCU-CHEK COMPACT STRIPS test strip TEST BLOOD SUGAR ONCE DAILY 100 each 1  . albuterol (PROVENTIL HFA;VENTOLIN HFA) 108 (90 Base) MCG/ACT inhaler Inhale 2 puffs into the lungs every 4 (four) hours as needed for wheezing or  shortness of breath.     . Aspirin (ADULT ASPIRIN LOW STRENGTH) 81 MG EC tablet Take 81 mg by mouth daily.      . cetirizine (ZYRTEC) 10 MG tablet Take 10 mg by mouth daily as needed.     . cholecalciferol (VITAMIN D) 1000 UNITS tablet Take 3,000 Units by mouth daily.     . enalapril (VASOTEC) 10 MG tablet TAKE ONE AND ONE-HALF TABLETS DAILY 135 tablet 1  . Magnesium Oxide 420 MG TABS Take 420 mg by mouth daily.     . Multiple Vitamins-Minerals (MULTIVITAL PO) Take 1 tablet by mouth daily.     . simvastatin (ZOCOR) 40 MG tablet TAKE 1 TABLET DAILY 90 tablet 1  . temazepam (RESTORIL) 7.5 MG capsule Take 1 capsule (7.5 mg total) by mouth at bedtime as needed. 90 capsule 1  . TOPROL XL 25 MG 24 hr tablet TAKE ONE-HALF (1/2) TABLET DAILY 45 tablet 1   No current facility-administered medications for this visit.     Allergies  Allergen Reactions  . Meperidine Hcl     Tongue swelling Because of a history of documented adverse serious drug reaction;Medi Alert bracelet  is recommended  . Zolpidem Tartrate     REACTION: Crazy dreams    Social History  Socioeconomic History  . Marital status: Widowed    Spouse name: Not on file  . Number of children: Not on file  . Years of education: Not on file  . Highest education level: Not on file  Occupational History  . Occupation: RETIRED    Employer: RETIRED  Social Needs  . Financial resource strain: Not on file  . Food insecurity:    Worry: Not on file    Inability: Not on file  . Transportation needs:    Medical: Not on file    Non-medical: Not on file  Tobacco Use  . Smoking status: Former Smoker    Types: Cigarettes    Last attempt to quit: 02/20/2003    Years since quitting: 14.5  . Smokeless tobacco: Never Used  Substance and Sexual Activity  . Alcohol use: Yes    Alcohol/week: 3.6 oz    Types: 3 Glasses of wine, 3 Shots of liquor per week    Comment:  1-3 drinks a week  . Drug use: No  . Sexual activity: Not on file    Lifestyle  . Physical activity:    Days per week: Not on file    Minutes per session: Not on file  . Stress: Not on file  Relationships  . Social connections:    Talks on phone: Not on file    Gets together: Not on file    Attends religious service: Not on file    Active member of club or organization: Not on file    Attends meetings of clubs or organizations: Not on file    Relationship status: Not on file  . Intimate partner violence:    Fear of current or ex partner: Not on file    Emotionally abused: Not on file    Physically abused: Not on file    Forced sexual activity: Not on file  Other Topics Concern  . 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     Family History  Problem Relation Age of Onset  . Heart failure Mother   . Heart disease Mother        CHF  . Heart attack Mother   . Hypertension Mother   . Heart attack Father   . Heart disease Father 58       MI  . Hypertension Father   . Heart attack Sister 67  . Heart disease Sister 40       MI  . Diabetes Sister   . Hyperlipidemia Sister   . Hypertension Sister   . Varicose Veins Sister   . Heart attack Brother 34  . Diabetes Brother   . Heart disease Brother 84       MI  . Cancer Brother   . Hypertension Brother   . Deep vein thrombosis Daughter   . Other Son        varicose veins  . Colon cancer Neg Hx     Review of Systems:  As stated in the HPI and otherwise negative.   BP 132/60   Pulse 61   Ht 5\' 11"  (1.803 m)   Wt 172 lb 3.2 oz (78.1 kg)   SpO2 93%   BMI 24.02 kg/m   Physical Examination:  General: Well developed, well nourished, NAD  HEENT: OP clear, mucus membranes moist  SKIN: warm, dry. No rashes. Neuro: No focal deficits  Musculoskeletal: Muscle strength 5/5 all ext  Psychiatric: Mood  and affect normal  Neck: No JVD, no carotid bruits, no thyromegaly, no lymphadenopathy.  Lungs:Clear bilaterally, no wheezes,  rhonci, crackles Cardiovascular: Regular rate and rhythm. No murmurs, gallops or rubs. Abdomen:Soft. Bowel sounds present. Non-tender.  Extremities: No lower extremity edema. Pulses are 2 + in the bilateral DP/PT.  Echo March 2017: Left ventricle: The cavity size was normal. There was mild focal   basal hypertrophy of the septum. Systolic function was normal.   The estimated ejection fraction was in the range of 55% to 60%.   Wall motion was normal; there were no regional wall motion   abnormalities. Doppler parameters are consistent with abnormal   left ventricular relaxation (grade 1 diastolic dysfunction).   Doppler parameters are consistent with high ventricular filling   pressure. - Mitral valve: There was mild regurgitation. - Right ventricle: The cavity size was mildly dilated. - Right atrium: The atrium was mildly dilated.  Impressions:  - Normal LV systolic function; grade 1 diastolic dysfunction; mild   MR; mild RAE and RVE; trace TR.  EKG:  EKG is ordered today. The ekg ordered today demonstrates sinus, rate 61 bpm. 1st degree AV block. PACs. RBBB. LAFB  Recent Labs: 09/26/2016: TSH 1.88 05/28/2017: ALT 13; BUN 20; Creatinine, Ser 1.37; Hemoglobin 14.2; Platelets 190.0; Potassium 4.3; Sodium 138   Lipid Panel    Component Value Date/Time   CHOL 151 09/26/2016 0920   TRIG 91.0 09/26/2016 0920   HDL 51.30 09/26/2016 0920   CHOLHDL 3 09/26/2016 0920   VLDL 18.2 09/26/2016 0920   LDLCALC 81 09/26/2016 0920     Wt Readings from Last 3 Encounters:  09/20/17 172 lb 3.2 oz (78.1 kg)  07/05/17 172 lb (78 kg)  05/28/17 172 lb (78 kg)     Other studies Reviewed: Additional studies/ records that were reviewed today include: . Review of the above records demonstrates:    Assessment and Plan:   1. CAD s/p CABG without angina: No chest pain. Continue ASA, statin and beta blocker.     2. HTN: BP is well controlled. No changes  3. Hyperlipidemia: Lipids are well  controlled. Continue statin.   Current medicines are reviewed at length with the patient today.  The patient does not have concerns regarding medicines.  The following changes have been made:  no change  Labs/ tests ordered today include:   Orders Placed This Encounter  Procedures  . EKG 12-Lead    Disposition:   FU with me in 12  months  Signed, Lauree Chandler, MD 09/20/2017 4:20 PM    [facility] [address], [place], [place]  [address] Phone: [phone]; Fax: [phone]

## 2017-09-20 NOTE — Patient Instructions (Signed)

## 2017-09-25 NOTE — Progress Notes (Signed)
Subjective:    Patient ID: Brian [name], male    DOB: 08/17/1931, 84 y.o.   MRN: [id number]  HPI The patient is here for follow up.  Diabetes: He is taking his medication daily as prescribed. He is compliant with a diabetic diet. He is exercising regularly.  He checks his feet daily and denies foot lesions. He is up-to-date with an ophthalmology examination.   COPD:  Sometimes when he lays down or bends over he has difficulty getting his breath.  He often feels SOB when showering.  He walks 3-4 times a week inside and he feels fine.  He is using his albuterol more now - can use it up to every 4 hrs. he has occasional wheezing, but denies cough and chest tightness.  There are no fevers or chills.  CAD, Hyperlipidemia: He is taking his medication daily. He is compliant with a low fat/cholesterol diet. He denies chest pain, palps.  He recently saw his cardiologist.  He is exercising regularly. He denies myalgias.    His BP at home is well controlled.    Insomnia:  He takes restorial as needed.  The medication works well.     Medications and allergies reviewed with patient and updated if appropriate.  Patient Active Problem List   Diagnosis Date Noted  . Abdominal pain 05/28/2017  . Vertigo 07/17/2016  . Nocturnal leg cramps 09/20/2015  . COPD (chronic obstructive pulmonary disease) ([place]) 05/31/2015  . CKD (chronic kidney disease) stage 3, GFR 30-59 ml/min (HCC) 05/31/2015  . Diabetes ([place]) 04/26/2015  . Epistaxis 04/05/2015  . AAA (abdominal aortic aneurysm) without rupture ([place]) 01/18/2015  . PAD (peripheral artery disease) ([place]) 01/18/2015  . Myalgia and myositis 07/02/2014  . Hereditary and idiopathic peripheral neuropathy 10/29/2012  . Hypertension 05/10/2011  . Bradycardia 05/10/2011  . CAD (coronary artery disease) 09/05/2010  . TINNITUS 04/04/2010  . CORONARY ARTERY BYPASS GRAFT, HX OF 03/31/2010  . Disturbance in sleep behavior 12/28/2008  . Cervicalgia 07/26/2008    . POLYP, COLON 07/16/2007  . Hyperlipidemia 09/24/2006  . History of gout 09/23/2006    Current Outpatient Medications on File Prior to Visit  Medication Sig Dispense Refill  . ACCU-CHEK COMPACT STRIPS test strip TEST BLOOD SUGAR ONCE DAILY 100 each 1  . albuterol (PROVENTIL HFA;VENTOLIN HFA) 108 (90 Base) MCG/ACT inhaler Inhale 2 puffs into the lungs every 4 (four) hours as needed for wheezing or shortness of breath.     . Aspirin (ADULT ASPIRIN LOW STRENGTH) 81 MG EC tablet Take 81 mg by mouth daily.      . cetirizine (ZYRTEC) 10 MG tablet Take 10 mg by mouth daily as needed.     . cholecalciferol (VITAMIN D) 1000 UNITS tablet Take 3,000 Units by mouth daily.     . enalapril (VASOTEC) 10 MG tablet TAKE ONE AND ONE-HALF TABLETS DAILY 135 tablet 1  . Magnesium Oxide 420 MG TABS Take 420 mg by mouth daily.     . Multiple Vitamins-Minerals (MULTIVITAL PO) Take 1 tablet by mouth daily.     . simvastatin (ZOCOR) 40 MG tablet TAKE 1 TABLET DAILY 90 tablet 1  . temazepam (RESTORIL) 7.5 MG capsule Take 1 capsule (7.5 mg total) by mouth at bedtime as needed. 90 capsule 1  . TOPROL XL 25 MG 24 hr tablet TAKE ONE-HALF (1/2) TABLET DAILY 45 tablet 1   No current facility-administered medications on file prior to visit.     Past Medical History:  Diagnosis Date  .  AAA (abdominal aortic aneurysm) ([place])   . Asthma 1976  . Bronchiolitis May 2016  . CAD (coronary artery disease)    NSTEMI 02/2010;  s/p CABG 1/12 (L-LAD, SOM1, S-PDA); echo in 08/2010: Mild LVH, EF 26%, grade 2 diastolic dysfunction, mild MR, mild LAE, mildly reduced RV function, mild RAE, PASP 42  . Colon polyps   . COPD (chronic obstructive pulmonary disease) ([place])   . Diabetes mellitus 2000   adult onset  . ED (erectile dysfunction)   . Gout   . HTN (hypertension)   . Hyperlipidemia   . Macular degeneration   . Myocardial infarction ([place]) 1 -13-2012  . Neuropathy     Past Surgical History:  Procedure Laterality Date  . 3  vessel CABG  03/03/2010  . cataract left eye    . COLONOSCOPY     neg 2009  . COLONOSCOPY  2014   5 mm sessile polyp; AVM  . CORONARY ARTERY BYPASS GRAFT  2012   X3  . EYE SURGERY    . INCISION AND DRAINAGE PERIRECTAL ABSCESS    . INGUINAL HERNIA REPAIR      Social History   Socioeconomic History  . Marital status: Widowed    Spouse name: Not on file  . Number of children: Not on file  . Years of education: Not on file  . Highest education level: Not on file  Occupational History  . Occupation: RETIRED    Employer: RETIRED  Social Needs  . Financial resource strain: Not on file  . Food insecurity:    Worry: Not on file    Inability: Not on file  . Transportation needs:    Medical: Not on file    Non-medical: Not on file  Tobacco Use  . Smoking status: Former Smoker    Types: Cigarettes    Last attempt to quit: 02/20/2003    Years since quitting: 14.6  . Smokeless tobacco: Never Used  Substance and Sexual Activity  . Alcohol use: Yes    Alcohol/week: 6.0 standard drinks    Types: 3 Glasses of wine, 3 Shots of liquor per week    Comment:  1-3 drinks a week  . Drug use: No  . Sexual activity: Not on file  Lifestyle  . Physical activity:    Days per week: Not on file    Minutes per session: Not on file  . Stress: Not on file  Relationships  . Social connections:    Talks on phone: Not on file    Gets together: Not on file    Attends religious service: Not on file    Active member of club or organization: Not on file    Attends meetings of clubs or organizations: Not on file    Relationship status: Not on file  Other Topics Concern  . 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     Family History  Problem Relation Age of Onset  . Heart failure Mother   . Heart disease Mother        CHF  . Heart attack Mother   . Hypertension Mother   . Heart attack Father   . Heart disease Father 39       MI    . Hypertension Father   . Heart attack Sister 82  . Heart disease Sister 61       MI  . Diabetes Sister   . Hyperlipidemia Sister   .  Hypertension Sister   . Varicose Veins Sister   . Heart attack Brother 37  . Diabetes Brother   . Heart disease Brother 21       MI  . Cancer Brother   . Hypertension Brother   . Deep vein thrombosis Daughter   . Other Son        varicose veins  . Colon cancer Neg Hx     Review of Systems  Constitutional: Negative for chills and fever.  Respiratory: Positive for shortness of breath and wheezing. Negative for cough and chest tightness.   Cardiovascular: Negative for chest pain, palpitations and leg swelling.  Endocrine: Positive for cold intolerance.  Neurological: Negative for light-headedness and headaches.       Objective:   Vitals:   09/26/17 0854  BP: (!) 160/70  Pulse: (!) 55  Resp: 16  Temp: 97.6 F (36.4 C)  SpO2: 91%   BP Readings from Last 3 Encounters:  09/26/17 (!) 160/70  09/20/17 132/60  07/05/17 (!) 158/76   Wt Readings from Last 3 Encounters:  09/26/17 170 lb (77.1 kg)  09/20/17 172 lb 3.2 oz (78.1 kg)  07/05/17 172 lb (78 kg)   Body mass index is 23.71 kg/m.   Physical Exam    Constitutional: Appears well-developed and well-nourished. No distress.  HENT:  Head: Normocephalic and atraumatic.  Neck: Neck supple. No tracheal deviation present. No thyromegaly present.  No cervical lymphadenopathy Cardiovascular: Normal rate, regular rhythm and normal heart sounds.   No murmur heard. No carotid bruit .  No edema Pulmonary/Chest: Effort normal and breath sounds normal. No respiratory distress. No has no wheezes. No rales.  Skin: Skin is warm and dry. Not diaphoretic.  Psychiatric: Normal mood and affect. Behavior is normal.      Assessment & Plan:    See Problem List for Assessment and Plan of chronic medical problems.

## 2017-09-26 ENCOUNTER — Ambulatory Visit (INDEPENDENT_AMBULATORY_CARE_PROVIDER_SITE_OTHER): Payer: Medicare Other | Admitting: Internal Medicine

## 2017-09-26 ENCOUNTER — Other Ambulatory Visit: Payer: Self-pay | Admitting: Emergency Medicine

## 2017-09-26 ENCOUNTER — Encounter: Payer: Self-pay | Admitting: Internal Medicine

## 2017-09-26 ENCOUNTER — Other Ambulatory Visit (INDEPENDENT_AMBULATORY_CARE_PROVIDER_SITE_OTHER): Payer: Medicare Other

## 2017-09-26 VITALS — BP 160/70 | HR 55 | Temp 97.6°F | Resp 16 | Wt 170.0 lb

## 2017-09-26 DIAGNOSIS — E7849 Other hyperlipidemia: Secondary | ICD-10-CM

## 2017-09-26 DIAGNOSIS — I2510 Atherosclerotic heart disease of native coronary artery without angina pectoris: Secondary | ICD-10-CM | POA: Diagnosis not present

## 2017-09-26 DIAGNOSIS — N183 Chronic kidney disease, stage 3 unspecified: Secondary | ICD-10-CM

## 2017-09-26 DIAGNOSIS — G479 Sleep disorder, unspecified: Secondary | ICD-10-CM | POA: Diagnosis not present

## 2017-09-26 DIAGNOSIS — N182 Chronic kidney disease, stage 2 (mild): Secondary | ICD-10-CM

## 2017-09-26 DIAGNOSIS — J439 Emphysema, unspecified: Secondary | ICD-10-CM | POA: Diagnosis not present

## 2017-09-26 DIAGNOSIS — E1122 Type 2 diabetes mellitus with diabetic chronic kidney disease: Secondary | ICD-10-CM

## 2017-09-26 DIAGNOSIS — I10 Essential (primary) hypertension: Secondary | ICD-10-CM

## 2017-09-26 LAB — COMPREHENSIVE METABOLIC PANEL
ALBUMIN: 4.3 g/dL (ref 3.5–5.2)
ALT: 15 U/L (ref 0–53)
AST: 22 U/L (ref 0–37)
Alkaline Phosphatase: 65 U/L (ref 39–117)
BUN: 16 mg/dL (ref 6–23)
CALCIUM: 10 mg/dL (ref 8.4–10.5)
CHLORIDE: 106 meq/L (ref 96–112)
CO2: 27 meq/L (ref 19–32)
CREATININE: 1.36 mg/dL (ref 0.40–1.50)
GFR: 64.26 mL/min (ref >60.00)
Glucose, Bld: 114 mg/dL — ABNORMAL HIGH (ref 70–99)
POTASSIUM: 5.5 meq/L — AB (ref 3.5–5.1)
Sodium: 141 mEq/L (ref 135–145)
Total Bilirubin: 0.6 mg/dL (ref 0.2–1.2)
Total Protein: 7.5 g/dL (ref 6.0–8.3)

## 2017-09-26 LAB — HEMOGLOBIN A1C: HEMOGLOBIN A1C: 6.4 % (ref 4.6–6.5)

## 2017-09-26 MED ORDER — UMECLIDINIUM-VILANTEROL 62.5-25 MCG/INH IN AEPB: 1.0000 | INHALATION_SPRAY | Freq: Every day | RESPIRATORY_TRACT | 11 refills | Status: DC

## 2017-09-26 MED ORDER — TEMAZEPAM 7.5 MG PO CAPS: 7.5000 mg | ORAL_CAPSULE | Freq: Every evening | ORAL | 1 refills | Status: DC | PRN

## 2017-09-26 NOTE — Assessment & Plan Note (Signed)
Controlled, stable Continue current dose of medication - restoril 7.5 mg

## 2017-09-26 NOTE — Assessment & Plan Note (Signed)
Lipids controlled Continue simvastatin

## 2017-09-26 NOTE — Patient Instructions (Signed)
  Test(s) ordered today. Your results will be released to [place] (or called to you) after review, usually within 72hours after test completion. If any changes need to be made, you will be notified at that same time.   Medications reviewed and updated.  Changes include starting an inhaler daily.  Your prescription(s) have been submitted to your pharmacy. Please take as directed and contact our office if you believe you are having problem(s) with the medication(s).   Please followup in 6 months

## 2017-09-26 NOTE — Assessment & Plan Note (Signed)
Blood pressure slightly elevated here today, but well controlled at home He will continue to monitor at home Continue current medication CMP

## 2017-09-26 NOTE — Assessment & Plan Note (Signed)
Not currently controlled He is experiencing shortness of breath with certain activities, but he states he can walk inside 3 or 4 times a week without difficulty He is using his albuterol inhaler up to 4 times a day Trial of Anoro-if this is not covered we will try an alternative Continue albuterol as needed If shortness of breath does not improve or if it worsens will refer to pulmonary

## 2017-09-26 NOTE — Assessment & Plan Note (Signed)
Diet controlled He is compliant with a diabetic diet and exercising regularly Check A1c

## 2017-09-26 NOTE — Assessment & Plan Note (Signed)
Just recently saw cardiology He denies any chest pain or palpitations.  He does explain some shortness of breath, but this is likely from his COPD Continue aspirin, statin and Toprol

## 2017-09-27 ENCOUNTER — Ambulatory Visit: Payer: Medicare Other | Admitting: Cardiovascular Disease

## 2017-09-30 ENCOUNTER — Other Ambulatory Visit (INDEPENDENT_AMBULATORY_CARE_PROVIDER_SITE_OTHER): Payer: Medicare Other

## 2017-09-30 ENCOUNTER — Encounter: Payer: Self-pay | Admitting: Internal Medicine

## 2017-09-30 DIAGNOSIS — N183 Chronic kidney disease, stage 3 unspecified: Secondary | ICD-10-CM

## 2017-09-30 LAB — BASIC METABOLIC PANEL
BUN: 15 mg/dL (ref 6–23)
CALCIUM: 9.4 mg/dL (ref 8.4–10.5)
CO2: 28 mEq/L (ref 19–32)
CREATININE: 1.43 mg/dL (ref 0.40–1.50)
Chloride: 105 mEq/L (ref 96–112)
GFR: 60.64 mL/min (ref >60.00)
GLUCOSE: 94 mg/dL (ref 70–99)
Potassium: 4.2 mEq/L (ref 3.5–5.1)
Sodium: 142 mEq/L (ref 135–145)

## 2017-10-02 ENCOUNTER — Other Ambulatory Visit: Payer: Self-pay | Admitting: Emergency Medicine

## 2017-10-02 MED ORDER — UMECLIDINIUM-VILANTEROL 62.5-25 MCG/INH IN AEPB: 1.0000 | INHALATION_SPRAY | Freq: Every day | RESPIRATORY_TRACT | 1 refills | Status: DC

## 2017-10-11 ENCOUNTER — Encounter: Payer: Self-pay | Admitting: Internal Medicine

## 2017-11-21 NOTE — Progress Notes (Signed)
Subjective:    Patient ID: Brian [name], male    DOB: Oct 05, 1931, 84 y.o.   MRN: [id number]  HPI The patient is here for an acute visit for decreased hearing.   He denies any pain or pressure in his ears. He denies cold symptoms.  He wonders if there is too much wax, which he has had in the past.      Medications and allergies reviewed with patient and updated if appropriate.  Patient Active Problem List   Diagnosis Date Noted  . Abdominal pain 05/28/2017  . Vertigo 07/17/2016  . Nocturnal leg cramps 09/20/2015  . COPD (chronic obstructive pulmonary disease) ([place]) 05/31/2015  . CKD (chronic kidney disease) stage 3, GFR 30-59 ml/min (HCC) 05/31/2015  . Diabetes ([place]) 04/26/2015  . Epistaxis 04/05/2015  . AAA (abdominal aortic aneurysm) without rupture ([place]) 01/18/2015  . PAD (peripheral artery disease) ([place]) 01/18/2015  . Myalgia and myositis 07/02/2014  . Hereditary and idiopathic peripheral neuropathy 10/29/2012  . Hypertension 05/10/2011  . Bradycardia 05/10/2011  . CAD (coronary artery disease) 09/05/2010  . TINNITUS 04/04/2010  . CORONARY ARTERY BYPASS GRAFT, HX OF 03/31/2010  . Disturbance in sleep behavior 12/28/2008  . Cervicalgia 07/26/2008  . POLYP, COLON 07/16/2007  . Hyperlipidemia 09/24/2006  . History of gout 09/23/2006    Current Outpatient Medications on File Prior to Visit  Medication Sig Dispense Refill  . ACCU-CHEK COMPACT STRIPS test strip TEST BLOOD SUGAR ONCE DAILY 100 each 1  . albuterol (PROVENTIL HFA;VENTOLIN HFA) 108 (90 Base) MCG/ACT inhaler Inhale 2 puffs into the lungs every 4 (four) hours as needed for wheezing or shortness of breath.     . Aspirin (ADULT ASPIRIN LOW STRENGTH) 81 MG EC tablet Take 81 mg by mouth daily.      . cetirizine (ZYRTEC) 10 MG tablet Take 10 mg by mouth daily as needed.     . cholecalciferol (VITAMIN D) 1000 UNITS tablet Take 3,000 Units by mouth daily.     . enalapril (VASOTEC) 10 MG tablet TAKE ONE AND  ONE-HALF TABLETS DAILY 135 tablet 1  . Magnesium Oxide 420 MG TABS Take 420 mg by mouth daily.     . Multiple Vitamins-Minerals (MULTIVITAL PO) Take 1 tablet by mouth daily.     . simvastatin (ZOCOR) 40 MG tablet TAKE 1 TABLET DAILY 90 tablet 1  . temazepam (RESTORIL) 7.5 MG capsule Take 1 capsule (7.5 mg total) by mouth at bedtime as needed. 90 capsule 1  . TOPROL XL 25 MG 24 hr tablet TAKE ONE-HALF (1/2) TABLET DAILY 45 tablet 1  . umeclidinium-vilanterol (ANORO ELLIPTA) 62.5-25 MCG/INH AEPB Inhale 1 puff into the lungs daily. 60 each 1   No current facility-administered medications on file prior to visit.     Past Medical History:  Diagnosis Date  . AAA (abdominal aortic aneurysm) ([place])   . Asthma 1976  . Bronchiolitis May 2016  . CAD (coronary artery disease)    NSTEMI 02/2010;  s/p CABG 1/12 (L-LAD, SOM1, S-PDA); echo in 08/2010: Mild LVH, EF 02%, grade 2 diastolic dysfunction, mild MR, mild LAE, mildly reduced RV function, mild RAE, PASP 42  . Colon polyps   . COPD (chronic obstructive pulmonary disease) ([place])   . Diabetes mellitus 2000   adult onset  . ED (erectile dysfunction)   . Gout   . HTN (hypertension)   . Hyperlipidemia   . Macular degeneration   . Myocardial infarction ([place]) 1 -13-2012  . Neuropathy  Past Surgical History:  Procedure Laterality Date  . 3 vessel CABG  03/03/2010  . cataract left eye    . COLONOSCOPY     neg 2009  . COLONOSCOPY  2014   5 mm sessile polyp; AVM  . CORONARY ARTERY BYPASS GRAFT  2012   X3  . EYE SURGERY    . INCISION AND DRAINAGE PERIRECTAL ABSCESS    . INGUINAL HERNIA REPAIR      Social History   Socioeconomic History  . Marital status: Widowed    Spouse name: Not on file  . Number of children: Not on file  . Years of education: Not on file  . Highest education level: Not on file  Occupational History  . Occupation: RETIRED    Employer: RETIRED  Social Needs  . Financial resource strain: Not on file  . Food  insecurity:    Worry: Not on file    Inability: Not on file  . Transportation needs:    Medical: Not on file    Non-medical: Not on file  Tobacco Use  . Smoking status: Former Smoker    Types: Cigarettes    Last attempt to quit: 02/20/2003    Years since quitting: 14.7  . Smokeless tobacco: Never Used  Substance and Sexual Activity  . Alcohol use: Yes    Alcohol/week: 6.0 standard drinks    Types: 3 Glasses of wine, 3 Shots of liquor per week    Comment:  1-3 drinks a week  . Drug use: No  . Sexual activity: Not on file  Lifestyle  . Physical activity:    Days per week: Not on file    Minutes per session: Not on file  . Stress: Not on file  Relationships  . Social connections:    Talks on phone: Not on file    Gets together: Not on file    Attends religious service: Not on file    Active member of club or organization: Not on file    Attends meetings of clubs or organizations: Not on file    Relationship status: Not on file  Other Topics Concern  . 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     Family History  Problem Relation Age of Onset  . Heart failure Mother   . Heart disease Mother        CHF  . Heart attack Mother   . Hypertension Mother   . Heart attack Father   . Heart disease Father 78       MI  . Hypertension Father   . Heart attack Sister 40  . Heart disease Sister 44       MI  . Diabetes Sister   . Hyperlipidemia Sister   . Hypertension Sister   . Varicose Veins Sister   . Heart attack Brother 26  . Diabetes Brother   . Heart disease Brother 38       MI  . Cancer Brother   . Hypertension Brother   . Deep vein thrombosis Daughter   . Other Son        varicose veins  . Colon cancer Neg Hx     Review of Systems  Constitutional: Negative for chills and fever.  HENT: Positive for hearing loss. Negative for congestion, ear pain, sinus pressure and sore throat.   Respiratory:  Negative for cough, shortness of breath and wheezing.   Neurological: Negative for  light-headedness and headaches.       Objective:   Vitals:   11/22/17 1313  BP: (!) 168/64  Pulse: 64  Resp: 16  Temp: 97.8 F (36.6 C)  SpO2: 97%   BP Readings from Last 3 Encounters:  11/22/17 (!) 168/64  09/26/17 (!) 160/70  09/20/17 132/60   Wt Readings from Last 3 Encounters:  11/22/17 171 lb (77.6 kg)  09/26/17 170 lb (77.1 kg)  09/20/17 172 lb 3.2 oz (78.1 kg)   Body mass index is 23.85 kg/m.   Physical Exam  Constitutional: He appears well-developed and well-nourished. No distress.  HENT:  Head: Normocephalic and atraumatic.  Right Ear: External ear normal.  Left Ear: External ear normal.  Mouth/Throat: Oropharynx is clear and moist.  Neck: Neck supple. No tracheal deviation present. No thyromegaly present.  Lymphadenopathy:    He has no cervical adenopathy.  Skin: Skin is warm and dry. He is not diaphoretic.       PRE-PROCEDURE EXAM: Right TM cannot be visualized due to total occlusion/impaction of the ear canal. PROCEDURE INDICATION: remove wax to visualize ear drum & relieve discomfort CONSENT:  Verbal  PROCEDURE NOTE:   RIGHT EAR:  The CMA used a metal wax curette under direct vision with an otoscope to free the wax bolus from the ear wall and then successfully removed a small bit of wax. The ear was then irrigated with warm water to remove the remaining wax. POST- PROCEDURE EXAM: TMs successfully visualized and found to have no erythema      Assessment & Plan:    See Problem List for Assessment and Plan of chronic medical problems.

## 2017-11-22 ENCOUNTER — Encounter: Payer: Self-pay | Admitting: Internal Medicine

## 2017-11-22 ENCOUNTER — Ambulatory Visit (INDEPENDENT_AMBULATORY_CARE_PROVIDER_SITE_OTHER): Payer: Medicare Other | Admitting: Internal Medicine

## 2017-11-22 VITALS — BP 168/64 | HR 64 | Temp 97.8°F | Resp 16 | Ht 71.0 in | Wt 171.0 lb

## 2017-11-22 DIAGNOSIS — E119 Type 2 diabetes mellitus without complications: Secondary | ICD-10-CM | POA: Diagnosis not present

## 2017-11-22 DIAGNOSIS — H9191 Unspecified hearing loss, right ear: Secondary | ICD-10-CM | POA: Diagnosis not present

## 2017-11-22 DIAGNOSIS — Z23 Encounter for immunization: Secondary | ICD-10-CM

## 2017-11-22 DIAGNOSIS — H31002 Unspecified chorioretinal scars, left eye: Secondary | ICD-10-CM | POA: Diagnosis not present

## 2017-11-22 DIAGNOSIS — H11003 Unspecified pterygium of eye, bilateral: Secondary | ICD-10-CM | POA: Diagnosis not present

## 2017-11-22 DIAGNOSIS — H52203 Unspecified astigmatism, bilateral: Secondary | ICD-10-CM | POA: Diagnosis not present

## 2017-11-22 LAB — HM DIABETES EYE EXAM

## 2017-11-22 NOTE — Patient Instructions (Signed)
You had your right ear cleaned out.   Continue to monitor your blood pressure at home.     Flu immunization administered today.

## 2017-11-23 DIAGNOSIS — H9191 Unspecified hearing loss, right ear: Secondary | ICD-10-CM | POA: Insufficient documentation

## 2017-11-23 NOTE — Assessment & Plan Note (Signed)
Impacted cerumen in right ear - likely causing decreased hearing Ear lavage successful in removing wax and hearing improved

## 2017-11-26 ENCOUNTER — Encounter: Payer: Self-pay | Admitting: Internal Medicine

## 2017-12-20 ENCOUNTER — Ambulatory Visit (HOSPITAL_COMMUNITY)
Admission: RE | Admit: 2017-12-20 | Discharge: 2017-12-20 | Disposition: A | Discharge status: Home / Self Care | Payer: Medicare Other | Source: Ambulatory Visit | Attending: Internal Medicine | Admitting: Internal Medicine

## 2017-12-20 ENCOUNTER — Ambulatory Visit: Payer: Medicare Other | Admitting: Vascular Surgery

## 2017-12-20 DIAGNOSIS — I714 Abdominal aortic aneurysm, without rupture, unspecified: Secondary | ICD-10-CM

## 2017-12-27 ENCOUNTER — Other Ambulatory Visit: Payer: Self-pay

## 2017-12-27 ENCOUNTER — Encounter: Payer: Self-pay | Admitting: Vascular Surgery

## 2017-12-27 ENCOUNTER — Ambulatory Visit (INDEPENDENT_AMBULATORY_CARE_PROVIDER_SITE_OTHER): Payer: Medicare Other | Admitting: Vascular Surgery

## 2017-12-27 ENCOUNTER — Encounter

## 2017-12-27 VITALS — BP 156/66 | HR 51 | Temp 97.0°F | Resp 16 | Ht 71.0 in | Wt 175.0 lb

## 2017-12-27 DIAGNOSIS — I714 Abdominal aortic aneurysm, without rupture, unspecified: Secondary | ICD-10-CM

## 2017-12-27 NOTE — Progress Notes (Signed)
Patient ID: Brian [name], male   DOB: 1931/07/25, 84 y.o.   MRN: [id number]  Reason for Consult: Aneurysm (6 mth f/u AAA. )   Referred by Binnie Rail, MD  Subjective:     HPI:  Brian [name] is a 84 y.o. male with known abdominal aortic aneurysm that is measured 4.7 cm or greater going back 2 years.  He has no new back or abdominal pain.  He remains quite active.  He has no complaints related to today's visit.  Past Medical History:  Diagnosis Date  . AAA (abdominal aortic aneurysm) ([place])   . Asthma 1976  . Bronchiolitis May 2016  . CAD (coronary artery disease)    NSTEMI 02/2010;  s/p CABG 1/12 (L-LAD, SOM1, S-PDA); echo in 08/2010: Mild LVH, EF 78%, grade 2 diastolic dysfunction, mild MR, mild LAE, mildly reduced RV function, mild RAE, PASP 42  . Colon polyps   . COPD (chronic obstructive pulmonary disease) ([place])   . Diabetes mellitus 2000   adult onset  . ED (erectile dysfunction)   . Gout   . HTN (hypertension)   . Hyperlipidemia   . Macular degeneration   . Myocardial infarction ([place]) 1 -13-2012  . Neuropathy    Family History  Problem Relation Age of Onset  . Heart failure Mother   . Heart disease Mother        CHF  . Heart attack Mother   . Hypertension Mother   . Heart attack Father   . Heart disease Father 79       MI  . Hypertension Father   . Heart attack Sister 45  . Heart disease Sister 62       MI  . Diabetes Sister   . Hyperlipidemia Sister   . Hypertension Sister   . Varicose Veins Sister   . Heart attack Brother 58  . Diabetes Brother   . Heart disease Brother 25       MI  . Cancer Brother   . Hypertension Brother   . Deep vein thrombosis Daughter   . Other Son        varicose veins  . Colon cancer Neg Hx    Past Surgical History:  Procedure Laterality Date  . 3 vessel CABG  03/03/2010  . cataract left eye    . COLONOSCOPY     neg 2009  . COLONOSCOPY  2014   5 mm sessile polyp; AVM  . CORONARY ARTERY BYPASS GRAFT  2012   X3  . EYE SURGERY    . INCISION AND DRAINAGE PERIRECTAL ABSCESS    . INGUINAL HERNIA REPAIR      Short Social History:  Social History   Tobacco Use  . Smoking status: Former Smoker    Types: Cigarettes    Last attempt to quit: 02/20/2003    Years since quitting: 14.8  . Smokeless tobacco: Never Used  Substance Use Topics  . Alcohol use: Yes    Alcohol/week: 6.0 standard drinks    Types: 3 Glasses of wine, 3 Shots of liquor per week    Comment:  1-3 drinks a week    Allergies  Allergen Reactions  . Meperidine Hcl     Tongue swelling Because of a history of documented adverse serious drug reaction;Medi Alert bracelet  is recommended  . Zolpidem Tartrate     REACTION: Crazy dreams    Current Outpatient Medications  Medication Sig Dispense Refill  . ACCU-CHEK COMPACT STRIPS test strip  TEST BLOOD SUGAR ONCE DAILY 100 each 1  . albuterol (PROVENTIL HFA;VENTOLIN HFA) 108 (90 Base) MCG/ACT inhaler Inhale 2 puffs into the lungs every 4 (four) hours as needed for wheezing or shortness of breath.     . Aspirin (ADULT ASPIRIN LOW STRENGTH) 81 MG EC tablet Take 81 mg by mouth daily.      . cetirizine (ZYRTEC) 10 MG tablet Take 10 mg by mouth daily as needed.     . cholecalciferol (VITAMIN D) 1000 UNITS tablet Take 3,000 Units by mouth daily.     . enalapril (VASOTEC) 10 MG tablet TAKE ONE AND ONE-HALF TABLETS DAILY 135 tablet 1  . Magnesium Oxide 420 MG TABS Take 420 mg by mouth daily.     . Multiple Vitamins-Minerals (MULTIVITAL PO) Take 1 tablet by mouth daily.     . simvastatin (ZOCOR) 40 MG tablet TAKE 1 TABLET DAILY 90 tablet 1  . temazepam (RESTORIL) 7.5 MG capsule Take 1 capsule (7.5 mg total) by mouth at bedtime as needed. 90 capsule 1  . TOPROL XL 25 MG 24 hr tablet TAKE ONE-HALF (1/2) TABLET DAILY 45 tablet 1  . umeclidinium-vilanterol (ANORO ELLIPTA) 62.5-25 MCG/INH AEPB Inhale 1 puff into the lungs daily. 60 each 1   No current facility-administered medications for this  visit.     Review of Systems  Constitutional:  Constitutional negative. HENT: HENT negative.  Eyes: Eyes negative.  Cardiovascular: Cardiovascular negative.  GI: Gastrointestinal negative.  Musculoskeletal: Musculoskeletal negative.  Skin: Skin negative.  Neurological: Neurological negative. Hematologic: Hematologic/lymphatic negative.  Psychiatric: Psychiatric negative.        Objective:  Objective   Vitals:   12/27/17 1043  BP: (!) 156/66  Pulse: (!) 51  Resp: 16  Temp: (!) 97 F (36.1 C)  TempSrc: Oral  SpO2: 92%  Weight: 175 lb (79.4 kg)  Height: 5\' 11"  (1.803 m)   Body mass index is 24.41 kg/m.  Physical Exam  Constitutional: He is oriented to person, place, and time. He appears well-developed.  HENT:  Head: Normocephalic.  Eyes: Pupils are equal, round, and reactive to light.  Cardiovascular: Normal rate.  Pulses:      Radial pulses are 2+ on the right side, and 2+ on the left side.       Femoral pulses are 2+ on the right side, and 2+ on the left side.      Popliteal pulses are 2+ on the right side, and 2+ on the left side.  Pulmonary/Chest: Effort normal.  Abdominal: Soft. He exhibits no mass.  Neurological: He is alert and oriented to person, place, and time.  Skin: Skin is warm and dry. Capillary refill takes less than 2 seconds.  Psychiatric: He has a normal mood and affect. His behavior is normal. Judgment and thought content normal.    Data: I reviewed his ultrasound which demonstrates a 5 x 4.4 cm distal aortic fusiform aneurysm that is 4.2 cm approximately triphasic waveforms throughout.     Assessment/Plan:     84 year old male follows up for known abdominal aortic aneurysm previously a smoker but quit many years ago.  He has no new back or abdominal pain we have discussed the signs and symptoms of rupture.  Is a very slow-growing aneurysm that is actually probably stable in size since 2018.  I compared his recent duplex to his previous CT  scan and I would wait to at least 5.5 cm by duplex before obtaining another CT scan.  We will see him  back in 6 months he is amenable to this plan.     Waynetta Sandy MD [facility]

## 2018-01-10 ENCOUNTER — Encounter: Payer: Self-pay | Admitting: Family

## 2018-01-10 ENCOUNTER — Ambulatory Visit: Payer: Medicare Other | Admitting: Vascular Surgery

## 2018-01-10 ENCOUNTER — Ambulatory Visit: Payer: Self-pay | Admitting: *Deleted

## 2018-01-10 ENCOUNTER — Ambulatory Visit (INDEPENDENT_AMBULATORY_CARE_PROVIDER_SITE_OTHER): Payer: Medicare Other | Admitting: Family

## 2018-01-10 VITALS — BP 162/68 | HR 61 | Temp 97.8°F | Ht 71.0 in | Wt 173.0 lb

## 2018-01-10 DIAGNOSIS — J441 Chronic obstructive pulmonary disease with (acute) exacerbation: Secondary | ICD-10-CM | POA: Diagnosis not present

## 2018-01-10 MED ORDER — PREDNISONE 20 MG PO TABS: 20.0000 mg | ORAL_TABLET | Freq: Every day | ORAL | 0 refills | Status: DC

## 2018-01-10 MED ORDER — DOXYCYCLINE HYCLATE 100 MG PO TABS: 100.0000 mg | ORAL_TABLET | Freq: Two times a day (BID) | ORAL | 0 refills | Status: DC

## 2018-01-10 NOTE — Progress Notes (Signed)
Brian [name] is a 84 y.o. male with the following history as recorded in Epic[employer]:  Patient Active Problem List   Diagnosis Date Noted  . Decreased hearing of right ear 11/23/2017  . Abdominal pain 05/28/2017  . Vertigo 07/17/2016  . Nocturnal leg cramps 09/20/2015  . COPD (chronic obstructive pulmonary disease) ([place]) 05/31/2015  . CKD (chronic kidney disease) stage 3, GFR 30-59 ml/min (HCC) 05/31/2015  . Diabetes ([place]) 04/26/2015  . Epistaxis 04/05/2015  . AAA (abdominal aortic aneurysm) without rupture ([place]) 01/18/2015  . PAD (peripheral artery disease) ([place]) 01/18/2015  . Myalgia and myositis 07/02/2014  . Hereditary and idiopathic peripheral neuropathy 10/29/2012  . Hypertension 05/10/2011  . Bradycardia 05/10/2011  . CAD (coronary artery disease) 09/05/2010  . TINNITUS 04/04/2010  . CORONARY ARTERY BYPASS GRAFT, HX OF 03/31/2010  . Disturbance in sleep behavior 12/28/2008  . Cervicalgia 07/26/2008  . POLYP, COLON 07/16/2007  . Hyperlipidemia 09/24/2006  . History of gout 09/23/2006    Current Outpatient Medications  Medication Sig Dispense Refill  . ACCU-CHEK COMPACT STRIPS test strip TEST BLOOD SUGAR ONCE DAILY 100 each 1  . albuterol (PROVENTIL HFA;VENTOLIN HFA) 108 (90 Base) MCG/ACT inhaler Inhale 2 puffs into the lungs every 4 (four) hours as needed for wheezing or shortness of breath.     . Aspirin (ADULT ASPIRIN LOW STRENGTH) 81 MG EC tablet Take 81 mg by mouth daily.      . cetirizine (ZYRTEC) 10 MG tablet Take 10 mg by mouth daily as needed.     . cholecalciferol (VITAMIN D) 1000 UNITS tablet Take 3,000 Units by mouth daily.     . enalapril (VASOTEC) 10 MG tablet TAKE ONE AND ONE-HALF TABLETS DAILY 135 tablet 1  . fluticasone (FLONASE) 50 MCG/ACT nasal spray     . Magnesium Oxide 420 MG TABS Take 420 mg by mouth daily.     . Multiple Vitamins-Minerals (MULTIVITAL PO) Take 1 tablet by mouth daily.     . simvastatin (ZOCOR) 40 MG tablet TAKE 1 TABLET DAILY 90  tablet 1  . temazepam (RESTORIL) 7.5 MG capsule Take 1 capsule (7.5 mg total) by mouth at bedtime as needed. 90 capsule 1  . TOPROL XL 25 MG 24 hr tablet TAKE ONE-HALF (1/2) TABLET DAILY 45 tablet 1  . umeclidinium-vilanterol (ANORO ELLIPTA) 62.5-25 MCG/INH AEPB Inhale 1 puff into the lungs daily. 60 each 1  . doxycycline (VIBRA-TABS) 100 MG tablet Take 1 tablet (100 mg total) by mouth 2 (two) times daily. 20 tablet 0  . predniSONE (DELTASONE) 20 MG tablet Take 1 tablet (20 mg total) by mouth daily with breakfast. 5 tablet 0   No current facility-administered medications for this visit.     Allergies: Meperidine hcl and Zolpidem tartrate  Past Medical History:  Diagnosis Date  . AAA (abdominal aortic aneurysm) ([place])   . Asthma 1976  . Bronchiolitis May 2016  . CAD (coronary artery disease)    NSTEMI 02/2010;  s/p CABG 1/12 (L-LAD, SOM1, S-PDA); echo in 08/2010: Mild LVH, EF 41%, grade 2 diastolic dysfunction, mild MR, mild LAE, mildly reduced RV function, mild RAE, PASP 42  . Colon polyps   . COPD (chronic obstructive pulmonary disease) ([place])   . Diabetes mellitus 2000   adult onset  . ED (erectile dysfunction)   . Gout   . HTN (hypertension)   . Hyperlipidemia   . Macular degeneration   . Myocardial infarction ([place]) 1 -13-2012  . Neuropathy     Past Surgical  History:  Procedure Laterality Date  . 3 vessel CABG  03/03/2010  . cataract left eye    . COLONOSCOPY     neg 2009  . COLONOSCOPY  2014   5 mm sessile polyp; AVM  . CORONARY ARTERY BYPASS GRAFT  2012   X3  . EYE SURGERY    . INCISION AND DRAINAGE PERIRECTAL ABSCESS    . INGUINAL HERNIA REPAIR      Family History  Problem Relation Age of Onset  . Heart failure Mother   . Heart disease Mother        CHF  . Heart attack Mother   . Hypertension Mother   . Heart attack Father   . Heart disease Father 49       MI  . Hypertension Father   . Heart attack Sister 22  . Heart disease Sister 6       MI  . Diabetes  Sister   . Hyperlipidemia Sister   . Hypertension Sister   . Varicose Veins Sister   . Heart attack Brother 4  . Diabetes Brother   . Heart disease Brother 39       MI  . Cancer Brother   . Hypertension Brother   . Deep vein thrombosis Daughter   . Other Son        varicose veins  . Colon cancer Neg Hx     Social History   Tobacco Use  . Smoking status: Former Smoker    Types: Cigarettes    Last attempt to quit: 02/20/2003    Years since quitting: 14.8  . Smokeless tobacco: Never Used  Substance Use Topics  . Alcohol use: Yes    Alcohol/week: 6.0 standard drinks    Types: 3 Glasses of wine, 3 Shots of liquor per week    Comment:  1-3 drinks a week    Subjective:  Patient presents with concerns for 2 week history of cough/ congestion; "just can't shake it." + COPD; is taking his ANORO daily/ having to use his albuterol at least 2-3 x per day; no fever; using Mucinex DM;     Objective:  Vitals:   01/10/18 1134  BP: (!) 162/68  Pulse: 61  Temp: 97.8 F (36.6 C)  TempSrc: Oral  SpO2: 94%  Weight: 173 lb 0.6 oz (78.5 kg)  Height: 5\' 11"  (1.803 m)    General: Well developed, well nourished, in no acute distress  Skin : Warm and dry.  Head: Normocephalic and atraumatic  Eyes: Sclera and conjunctiva clear; pupils round and reactive to light; extraocular movements intact  Ears: External normal; canals clear; tympanic membranes normal  Oropharynx: Pink, supple. No suspicious lesions  Neck: Supple without thyromegaly, adenopathy  Lungs: Respirations unlabored; coarse breath sounds noted on exam- no wheezing CVS exam: normal rate and regular rhythm.  Neurologic: Alert and oriented; speech intact; face symmetrical; moves all extremities well; CNII-XII intact without focal deficit   Assessment:  1. COPD with acute exacerbation (HCC)     Plan:  Rx for Doxycycline 100 mg bid x 10 days, Prednisone 20 mg qd x 5 days; continue to use Flonase and Mucinex; increase fluids, rest  and follow-up worse, no better.    No follow-ups on file.  No orders of the defined types were placed in this encounter.   Requested Prescriptions   Signed Prescriptions Disp Refills  . predniSONE (DELTASONE) 20 MG tablet 5 tablet 0    Sig: Take 1 tablet (20 mg total)  by mouth daily with breakfast.  . doxycycline (VIBRA-TABS) 100 MG tablet 20 tablet 0    Sig: Take 1 tablet (100 mg total) by mouth 2 (two) times daily.

## 2018-01-10 NOTE — Telephone Encounter (Signed)
Pt called with complaints of chest tightness, and cough for 1 week; he also reports a runny nose that started a couple of weeks ago; the  Pt said that this started as a cold, and he has been taking mucinex; the also says that he has COPD and asthma so he has shortness of breath at baseline; recommendations made per nurse triage protocol; he normally sees Dr Billey Gosling but she has no availability; pt offered and accepted appointment with Jodi Mourning, [place], 01/10/18 at 1120; he verbalized understanding; will route to office for notification of this upcoming appointment.     Reason for Disposition . [1] Longstanding difficulty breathing (e.g., CHF, COPD, emphysema) AND [2] WORSE than normal  Answer Assessment - Initial Assessment Questions 1. RESPIRATORY STATUS: "Describe your breathing?" (e.g., wheezing, shortness of breath, unable to speak, severe coughing)      Coughing, chest tightness 2. ONSET: "When did this breathing problem begin?"     01/03/18 3. PATTERN "Does the difficult breathing come and go, or has it been constant since it started?"      Comes and goes 4. SEVERITY: "How bad is your breathing?" (e.g., mild, moderate, severe)    - MILD: No SOB at rest, mild SOB with walking, speaks normally in sentences, can lay down, no retractions, pulse < 100.    - MODERATE: SOB at rest, SOB with minimal exertion and prefers to sit, cannot lie down flat, speaks in phrases, mild retractions, audible wheezing, pulse 100-120.    - SEVERE: Very SOB at rest, speaks in single words, struggling to breathe, sitting hunched forward, retractions, pulse > 120      Moderate; rescue inhaler helped 5. RECURRENT SYMPTOM: "Have you had difficulty breathing before?" If so, ask: "When was the last time?" and "What happened that time?"      Yes hx COPD and Ashtma 6. CARDIAC HISTORY: "Do you have any history of heart disease?" (e.g., heart attack, angina, bypass surgery, angioplasty)      Bypass surgery 7. LUNG  HISTORY: "Do you have any history of lung disease?"  (e.g., pulmonary embolus, asthma, emphysema)     COPD, asthma 8. CAUSE: "What do you think is causing the breathing problem?"      Not sure 9. OTHER SYMPTOMS: "Do you have any other symptoms? (e.g., dizziness, runny nose, cough, chest pain, fever)     Runny nose, cough which is keeping the pt up at night (light brown sputum) 10. PREGNANCY: "Is there any chance you are pregnant?" "When was your last menstrual period?"       n/a 11. TRAVEL: "Have you traveled out of the country in the last month?" (e.g., travel history, exposures)       no  Protocols used: BREATHING DIFFICULTY-A-AH

## 2018-01-28 ENCOUNTER — Encounter (HOSPITAL_COMMUNITY): Payer: Self-pay | Admitting: Emergency Medicine

## 2018-01-28 ENCOUNTER — Emergency Department (HOSPITAL_COMMUNITY): Payer: Medicare Other

## 2018-01-28 ENCOUNTER — Emergency Department (HOSPITAL_COMMUNITY)
Admission: EM | Admit: 2018-01-28 | Discharge: 2018-01-28 | Disposition: A | Discharge status: Home / Self Care | Payer: Medicare Other | Attending: Emergency Medicine | Admitting: Emergency Medicine

## 2018-01-28 ENCOUNTER — Other Ambulatory Visit: Payer: Self-pay

## 2018-01-28 DIAGNOSIS — Z7982 Long term (current) use of aspirin: Secondary | ICD-10-CM | POA: Insufficient documentation

## 2018-01-28 DIAGNOSIS — I129 Hypertensive chronic kidney disease with stage 1 through stage 4 chronic kidney disease, or unspecified chronic kidney disease: Secondary | ICD-10-CM | POA: Insufficient documentation

## 2018-01-28 DIAGNOSIS — J449 Chronic obstructive pulmonary disease, unspecified: Secondary | ICD-10-CM | POA: Diagnosis not present

## 2018-01-28 DIAGNOSIS — R0789 Other chest pain: Secondary | ICD-10-CM | POA: Diagnosis not present

## 2018-01-28 DIAGNOSIS — Z87891 Personal history of nicotine dependence: Secondary | ICD-10-CM | POA: Diagnosis not present

## 2018-01-28 DIAGNOSIS — N183 Chronic kidney disease, stage 3 (moderate): Secondary | ICD-10-CM | POA: Diagnosis not present

## 2018-01-28 DIAGNOSIS — I10 Essential (primary) hypertension: Secondary | ICD-10-CM | POA: Diagnosis not present

## 2018-01-28 DIAGNOSIS — Z79899 Other long term (current) drug therapy: Secondary | ICD-10-CM | POA: Insufficient documentation

## 2018-01-28 DIAGNOSIS — R079 Chest pain, unspecified: Secondary | ICD-10-CM | POA: Diagnosis not present

## 2018-01-28 LAB — BASIC METABOLIC PANEL
Anion gap: 10 (ref 5–15)
BUN: 16 mg/dL (ref 8–23)
CALCIUM: 9.3 mg/dL (ref 8.9–10.3)
CO2: 24 mmol/L (ref 22–32)
Chloride: 104 mmol/L (ref 98–111)
Creatinine, Ser: 1.58 mg/dL — ABNORMAL HIGH (ref 0.61–1.24)
GFR calc Af Amer: 46 mL/min — ABNORMAL LOW (ref >60)
GFR calc non Af Amer: 40 mL/min — ABNORMAL LOW (ref >60)
Glucose, Bld: 142 mg/dL — ABNORMAL HIGH (ref 70–99)
Potassium: 4.1 mmol/L (ref 3.5–5.1)
SODIUM: 138 mmol/L (ref 135–145)

## 2018-01-28 LAB — CBC WITH DIFFERENTIAL/PLATELET
Abs Immature Granulocytes: 0.01 10*3/uL (ref 0.00–0.07)
BASOS PCT: 1 %
Basophils Absolute: 0.1 10*3/uL (ref 0.0–0.1)
Eosinophils Absolute: 0.2 10*3/uL (ref 0.0–0.5)
Eosinophils Relative: 3 %
HCT: 45.4 % (ref 39.0–52.0)
Hemoglobin: 14.3 g/dL (ref 13.0–17.0)
Immature Granulocytes: 0 %
Lymphocytes Relative: 28 %
Lymphs Abs: 1.7 10*3/uL (ref 0.7–4.0)
MCH: 29.7 pg (ref 26.0–34.0)
MCHC: 31.5 g/dL (ref 30.0–36.0)
MCV: 94.4 fL (ref 80.0–100.0)
Monocytes Absolute: 0.7 10*3/uL (ref 0.1–1.0)
Monocytes Relative: 11 %
Neutro Abs: 3.6 10*3/uL (ref 1.7–7.7)
Neutrophils Relative %: 57 %
Platelets: 279 10*3/uL (ref 150–400)
RBC: 4.81 MIL/uL (ref 4.22–5.81)
RDW: 12.2 % (ref 11.5–15.5)
WBC: 6.3 10*3/uL (ref 4.0–10.5)
nRBC: 0 % (ref 0.0–0.2)

## 2018-01-28 LAB — I-STAT TROPONIN, ED: Troponin i, poc: 0 ng/mL (ref 0.00–0.08)

## 2018-01-28 MED ORDER — SODIUM CHLORIDE 0.9 % IV BOLUS
1000.0000 mL | Freq: Once | INTRAVENOUS | Status: AC
  Administered 2018-01-28: 1000 mL via INTRAVENOUS

## 2018-01-28 NOTE — ED Provider Notes (Signed)
[facility] EMERGENCY DEPARTMENT Provider Note   CSN: [id number] Arrival date & time: 01/28/18  0932     History   Chief Complaint Chief Complaint  Patient presents with  . Chest Pain    HPI Brian [name] is a 84 y.o. male.  84yo M w/ PMH including AAA, COPD, CAD, T2DM, HTN, neuropathy who p/w chest pain.  Patient states that earlier today prior to arrival.  He was sitting when he had a sudden onset of electric shock-like pain across the front of his chest.  It lasted 1 second and completely resolved.  It happened once yesterday but otherwise has not continue to recur.  He denies any associated back or abdominal pain.  No nausea, vomiting, or diaphoresis.  He has chronic shortness of breath related to COPD but denies any change recently. Currently he denies any complaints. No fevers, cough, or recent illness. Compliant with medications. No h/o pacemaker/AICD.   The history is provided by the patient.    Past Medical History:  Diagnosis Date  . AAA (abdominal aortic aneurysm) ([place])   . Asthma 1976  . Bronchiolitis May 2016  . CAD (coronary artery disease)    NSTEMI 02/2010;  s/p CABG 1/12 (L-LAD, SOM1, S-PDA); echo in 08/2010: Mild LVH, EF 62%, grade 2 diastolic dysfunction, mild MR, mild LAE, mildly reduced RV function, mild RAE, PASP 42  . Colon polyps   . COPD (chronic obstructive pulmonary disease) ([place])   . Diabetes mellitus 2000   adult onset  . ED (erectile dysfunction)   . Gout   . HTN (hypertension)   . Hyperlipidemia   . Macular degeneration   . Myocardial infarction ([place]) 1 -13-2012  . Neuropathy     Patient Active Problem List   Diagnosis Date Noted  . Decreased hearing of right ear 11/23/2017  . Abdominal pain 05/28/2017  . Vertigo 07/17/2016  . Nocturnal leg cramps 09/20/2015  . COPD (chronic obstructive pulmonary disease) ([place]) 05/31/2015  . CKD (chronic kidney disease) stage 3, GFR 30-59 ml/min (HCC) 05/31/2015  . Diabetes ([place])  04/26/2015  . Epistaxis 04/05/2015  . AAA (abdominal aortic aneurysm) without rupture ([place]) 01/18/2015  . PAD (peripheral artery disease) ([place]) 01/18/2015  . Myalgia and myositis 07/02/2014  . Hereditary and idiopathic peripheral neuropathy 10/29/2012  . Hypertension 05/10/2011  . Bradycardia 05/10/2011  . CAD (coronary artery disease) 09/05/2010  . TINNITUS 04/04/2010  . CORONARY ARTERY BYPASS GRAFT, HX OF 03/31/2010  . Disturbance in sleep behavior 12/28/2008  . Cervicalgia 07/26/2008  . POLYP, COLON 07/16/2007  . Hyperlipidemia 09/24/2006  . History of gout 09/23/2006    Past Surgical History:  Procedure Laterality Date  . 3 vessel CABG  03/03/2010  . cataract left eye    . COLONOSCOPY     neg 2009  . COLONOSCOPY  2014   5 mm sessile polyp; AVM  . CORONARY ARTERY BYPASS GRAFT  2012   X3  . EYE SURGERY    . INCISION AND DRAINAGE PERIRECTAL ABSCESS    . INGUINAL HERNIA REPAIR          Home Medications    Prior to Admission medications   Medication Sig Start Date End Date Taking? Authorizing Provider  Aspirin (ADULT ASPIRIN LOW STRENGTH) 81 MG EC tablet Take 81 mg by mouth daily.     Yes [provider]  cholecalciferol (VITAMIN D) 1000 UNITS tablet Take 3,000 Units by mouth daily.    Yes [provider]  enalapril (  VASOTEC) 10 MG tablet TAKE ONE AND ONE-HALF TABLETS DAILY Patient taking differently: Take 15 mg by mouth daily.  08/27/17  Yes Burns, [name], MD  fluticasone (FLONASE) 50 MCG/ACT nasal spray Place 2 sprays into both nostrils daily as needed for allergies or rhinitis.  04/12/15  Yes [provider]  Magnesium Oxide 420 MG TABS Take 420 mg by mouth daily.    Yes [provider]  Multiple Vitamins-Minerals (MULTIVITAL PO) Take 1 tablet by mouth daily.    Yes [provider]  simvastatin (ZOCOR) 40 MG tablet TAKE 1 TABLET DAILY Patient taking differently: Take 40 mg by mouth daily at 6 PM.  08/27/17  Yes Burns, [name],  MD  temazepam (RESTORIL) 7.5 MG capsule Take 1 capsule (7.5 mg total) by mouth at bedtime as needed. Patient taking differently: Take 7.5 mg by mouth at bedtime as needed for sleep.  09/26/17  Yes Burns, [name], MD  TOPROL XL 25 MG 24 hr tablet TAKE ONE-HALF (1/2) TABLET DAILY Patient taking differently: Take 12.5 mg by mouth daily.  09/02/17  Yes Burns, [name], MD  umeclidinium-vilanterol (ANORO ELLIPTA) 62.5-25 MCG/INH AEPB Inhale 1 puff into the lungs daily. 10/02/17  Yes Burns, [name], MD  ACCU-CHEK COMPACT STRIPS test strip TEST BLOOD SUGAR ONCE DAILY 08/18/11   Hendricks Limes, MD  cetirizine (ZYRTEC) 10 MG tablet Take 10 mg by mouth daily as needed for allergies.     [provider]    Family History Family History  Problem Relation Age of Onset  . Heart failure Mother   . Heart disease Mother        CHF  . Heart attack Mother   . Hypertension Mother   . Heart attack Father   . Heart disease Father 68       MI  . Hypertension Father   . Heart attack Sister 58  . Heart disease Sister 16       MI  . Diabetes Sister   . Hyperlipidemia Sister   . Hypertension Sister   . Varicose Veins Sister   . Heart attack Brother 49  . Diabetes Brother   . Heart disease Brother 24       MI  . Cancer Brother   . Hypertension Brother   . Deep vein thrombosis Daughter   . Other Son        varicose veins  . Colon cancer Neg Hx     Social History Social History   Tobacco Use  . Smoking status: Former Smoker    Types: Cigarettes    Last attempt to quit: 02/20/2003    Years since quitting: 14.9  . Smokeless tobacco: Never Used  Substance Use Topics  . Alcohol use: Yes    Alcohol/week: 6.0 standard drinks    Types: 3 Glasses of wine, 3 Shots of liquor per week    Comment:  1-3 drinks a week  . Drug use: No     Allergies   Meperidine hcl and Zolpidem tartrate   Review of Systems Review of Systems All other systems reviewed and are negative except that which was  mentioned in HPI   Physical Exam Updated Vital Signs BP (!) 156/103 (BP Location: Left Arm)   Pulse 74   Resp (!) 24   Ht 5\' 11"  (1.803 m)   Wt 79 kg   SpO2 93%   BMI 24.29 kg/m   Physical Exam  Constitutional: He is oriented to person, place,  and time. He appears well-developed and well-nourished. No distress.  HENT:  Head: Normocephalic and atraumatic.  Moist mucous membranes  Eyes: Pupils are equal, round, and reactive to light. Conjunctivae are normal.  Neck: Neck supple.  Cardiovascular: Normal rate, regular rhythm and normal heart sounds.  No murmur heard. Pulmonary/Chest: Effort normal and breath sounds normal.  Abdominal: Soft. Bowel sounds are normal. He exhibits no distension. There is no tenderness.  Musculoskeletal: He exhibits no edema.  Neurological: He is alert and oriented to person, place, and time.  Fluent speech  Skin: Skin is warm and dry.  Psychiatric: He has a normal mood and affect. Judgment normal.  Nursing note and vitals reviewed.    ED Treatments / Results  Labs (all labs ordered are listed, but only abnormal results are displayed) Labs Reviewed  BASIC METABOLIC PANEL - Abnormal; Notable for the following components:      Result Value   Glucose, Bld 142 (*)    Creatinine, Ser 1.58 (*)    GFR calc non Af Amer 40 (*)    GFR calc Af Amer 46 (*)    All other components within normal limits  CBC WITH DIFFERENTIAL/PLATELET  TROPONIN I  I-STAT TROPONIN, ED  I-STAT TROPONIN, ED    EKG EKG Interpretation  Date/Time:  Tuesday January 28 2018 09:42:14 EST Ventricular Rate:  77 PR Interval:    QRS Duration: 147 QT Interval:  419 QTC Calculation: 475 R Axis:   -114 Text Interpretation:  Sinus rhythm Prolonged PR interval RBBB and LAFB rate faster than previous Confirmed by Theotis Burrow [id number]) on 01/28/2018 11:16:17 AM   Radiology Dg Chest 2 View  Result Date: 01/28/2018 CLINICAL DATA:  Central chest pain. EXAM: CHEST - 2 VIEW  COMPARISON:  06/01/2015 FINDINGS: Prior CABG. Heart is normal size. No confluent airspace opacities or effusions. No acute bony abnormality. IMPRESSION: No active cardiopulmonary disease. Electronically Signed   By: Rolm Baptise M.D.   On: 01/28/2018 11:17    Procedures Procedures (including critical care time)  Medications Ordered in ED Medications  sodium chloride 0.9 % bolus 1,000 mL (1,000 mLs Intravenous New Bag/Given 01/28/18 1501)     Initial Impression / Assessment and Plan / ED Course  I have reviewed the triage vital signs and the nursing notes.  Pertinent labs & imaging results that were available during my care of the patient were reviewed by me and considered in my medical decision making (see chart for details).    Well-appearing on exam, no complaints.  EKG without acute ischemic changes.  His episode lasted for 1 second and he continually describes it as a shocklike electrical pain.  Lab work notable for mild AKI with creatinine of 1.5.  Gave IV fluid bolus.  BMP and CBC otherwise reassuring.  The lab lost his initial troponin blood sample however the second troponin over 3 hours after his arrival was normal and the patient has had no repeat occurrences of his chest pain since this morning therefore I feel this sufficiently rules out ischemia. His story is very atypical for ACS, especially given it lasted 1 second.  I have discussed following up with his cardiologist if he continues to have intermittent episodes like this.  I have extensively reviewed return precautions regarding any sustained chest pain or sudden new changes in symptoms.  He voiced understanding. Final Clinical Impressions(s) / ED Diagnoses   Final diagnoses:  Atypical chest pain    ED Discharge Orders    None  , Wenda Overland, MD 01/28/18 1535

## 2018-01-28 NOTE — ED Triage Notes (Signed)
Pt from home with c/o CP that he notes felt like an electric shock.  Pt states same happened yesterday and lasting only a few seconds each time.  Pt having mild SOB, also has a history of COPD.

## 2018-01-28 NOTE — ED Notes (Signed)
Pt stable, ambulatory, and verbalizes understanding of d/c instructions.  

## 2018-01-29 ENCOUNTER — Encounter: Payer: Self-pay | Admitting: Physician Assistant

## 2018-01-29 ENCOUNTER — Telehealth: Payer: Self-pay | Admitting: Cardiovascular Disease

## 2018-01-29 ENCOUNTER — Ambulatory Visit (INDEPENDENT_AMBULATORY_CARE_PROVIDER_SITE_OTHER): Payer: Medicare Other | Admitting: Physician Assistant

## 2018-01-29 VITALS — BP 138/60 | HR 68 | Ht 71.0 in | Wt 168.1 lb

## 2018-01-29 DIAGNOSIS — E782 Mixed hyperlipidemia: Secondary | ICD-10-CM | POA: Diagnosis not present

## 2018-01-29 DIAGNOSIS — R079 Chest pain, unspecified: Secondary | ICD-10-CM | POA: Insufficient documentation

## 2018-01-29 DIAGNOSIS — I10 Essential (primary) hypertension: Secondary | ICD-10-CM | POA: Diagnosis not present

## 2018-01-29 DIAGNOSIS — I2510 Atherosclerotic heart disease of native coronary artery without angina pectoris: Secondary | ICD-10-CM

## 2018-01-29 DIAGNOSIS — R0789 Other chest pain: Secondary | ICD-10-CM | POA: Diagnosis not present

## 2018-01-29 NOTE — Telephone Encounter (Signed)
New message   Pt c/o of Chest Pain: STAT if CP now or developed within 24 hours  1. Are you having CP right now? No, patient states that he feels like he  is having electric shock from left side of heart to the right side of the heart, patient does not have a device.  2. Are you experiencing any other symptoms (ex. SOB, nausea, vomiting, sweating)? Copd   3. How long have you been experiencing CP? Started 2 days ago   4. Is your CP continuous or coming and going?coming and going   5. Have you taken Nitroglycerin?no  ?

## 2018-01-29 NOTE — Telephone Encounter (Signed)
Received call transferred directly from operator and spoke with pt. He was seen in ED yesterday for electric shock type feeling across chest. Episode lasted about a second.  Reports he had episode on Monday also.  He is calling to schedule follow up appointment with cardiology.  Today he is feeling fine.  No more episodes.  No other complaints.   I scheduled pt to see Ermalinda Barrios, PA today at 12:30

## 2018-01-29 NOTE — Progress Notes (Signed)
Cardiology Office Note    Date:  01/29/2018   ID:  Brian [name], DOB 02-18-1932, MRN [id number]  PCP:  Binnie Rail, MD  Cardiologist: Lauree Chandler, MD EPS: None  Chief Complaint  Patient presents with  . Chest Pain    History of Present Illness:  Brian [name] is a 84 y.o. male with history of CAD status post and STEMI 02/2010 followed by CABG 03/03/2010.  Follow-up echo 04/2015 normal LV size and function, mild MR.  Also has hypertension, HLD, DM, COPD.  Patient last saw Dr. Angelena Form 09/20/2017 at which time he had chronic dyspnea on exertion felt secondary to COPD but otherwise was doing well.  Patient was in the emergency room yesterday with atypical chest pain.  Described as an electrical shocklike pain that lasted less than 1 second.  EKG without acute change, troponins negative.  Other labs stable.  Has had 2 episodes of flash like shooting pain in his left chest. No tightness, pressure, dyspnea, palpitations,.  Patient denies any unusual heavy lifting or strain.  He did work in his yard last week including raking leaves but that is not unusual for him.  He had no symptoms during it.    Past Medical History:  Diagnosis Date  . AAA (abdominal aortic aneurysm) ([place])   . Asthma 1976  . Bronchiolitis May 2016  . CAD (coronary artery disease)    NSTEMI 02/2010;  s/p CABG 1/12 (L-LAD, SOM1, S-PDA); echo in 08/2010: Mild LVH, EF 42%, grade 2 diastolic dysfunction, mild MR, mild LAE, mildly reduced RV function, mild RAE, PASP 42  . Colon polyps   . COPD (chronic obstructive pulmonary disease) ([place])   . Diabetes mellitus 2000   adult onset  . ED (erectile dysfunction)   . Gout   . HTN (hypertension)   . Hyperlipidemia   . Macular degeneration   . Myocardial infarction ([place]) 1 -13-2012  . Neuropathy     Past Surgical History:  Procedure Laterality Date  . 3 vessel CABG  03/03/2010  . cataract left eye    . COLONOSCOPY     neg 2009  . COLONOSCOPY  2014   5  mm sessile polyp; AVM  . CORONARY ARTERY BYPASS GRAFT  2012   X3  . EYE SURGERY    . INCISION AND DRAINAGE PERIRECTAL ABSCESS    . INGUINAL HERNIA REPAIR      Current Medications: Current Meds  Medication Sig  . ACCU-CHEK COMPACT STRIPS test strip TEST BLOOD SUGAR ONCE DAILY  . Aspirin (ADULT ASPIRIN LOW STRENGTH) 81 MG EC tablet Take 81 mg by mouth daily.    . cetirizine (ZYRTEC) 10 MG tablet Take 10 mg by mouth daily as needed for allergies.   . cholecalciferol (VITAMIN D) 1000 UNITS tablet Take 3,000 Units by mouth daily.   . enalapril (VASOTEC) 10 MG tablet TAKE ONE AND ONE-HALF TABLETS DAILY (Patient taking differently: Take 15 mg by mouth daily. )  . fluticasone (FLONASE) 50 MCG/ACT nasal spray Place 2 sprays into both nostrils daily as needed for allergies or rhinitis.   . Magnesium Oxide 420 MG TABS Take 420 mg by mouth daily.   . Multiple Vitamins-Minerals (MULTIVITAL PO) Take 1 tablet by mouth daily.   . simvastatin (ZOCOR) 40 MG tablet TAKE 1 TABLET DAILY (Patient taking differently: Take 40 mg by mouth daily at 6 PM. )  . temazepam (RESTORIL) 7.5 MG capsule Take 1 capsule (7.5 mg total) by mouth at bedtime as  needed. (Patient taking differently: Take 7.5 mg by mouth at bedtime as needed for sleep. )  . TOPROL XL 25 MG 24 hr tablet TAKE ONE-HALF (1/2) TABLET DAILY (Patient taking differently: Take 12.5 mg by mouth daily. )  . umeclidinium-vilanterol (ANORO ELLIPTA) 62.5-25 MCG/INH AEPB Inhale 1 puff into the lungs daily.     Allergies:   Meperidine hcl and Zolpidem tartrate   Social History   Socioeconomic History  . Marital status: Widowed    Spouse name: Not on file  . Number of children: Not on file  . Years of education: Not on file  . Highest education level: Not on file  Occupational History  . Occupation: RETIRED    Employer: RETIRED  Social Needs  . Financial resource strain: Not on file  . Food insecurity:    Worry: Not on file    Inability: Not on file    . Transportation needs:    Medical: Not on file    Non-medical: Not on file  Tobacco Use  . Smoking status: Former Smoker    Types: Cigarettes    Last attempt to quit: 02/20/2003    Years since quitting: 14.9  . Smokeless tobacco: Never Used  Substance and Sexual Activity  . Alcohol use: Yes    Alcohol/week: 6.0 standard drinks    Types: 3 Glasses of wine, 3 Shots of liquor per week    Comment:  1-3 drinks a week  . Drug use: No  . Sexual activity: Not on file  Lifestyle  . Physical activity:    Days per week: Not on file    Minutes per session: Not on file  . Stress: Not on file  Relationships  . Social connections:    Talks on phone: Not on file    Gets together: Not on file    Attends religious service: Not on file    Active member of club or organization: Not on file    Attends meetings of clubs or organizations: Not on file    Relationship status: Not on file  Other Topics Concern  . 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      Family History:  The patient's family history includes Cancer in his brother; Deep vein thrombosis in his daughter; Diabetes in his brother and sister; Heart attack in his father and mother; Heart attack (age of onset: 11) in his sister; Heart attack (age of onset: 69) in his brother; Heart disease in his mother; Heart disease (age of onset: 52) in his sister; Heart disease (age of onset: 27) in his brother; Heart disease (age of onset: 8) in his father; Heart failure in his mother; Hyperlipidemia in his sister; Hypertension in his brother, father, mother, and sister; Other in his son; Varicose Veins in his sister.   ROS:   Please see the history of present illness.    Review of Systems  Constitution: Negative.  HENT: Negative.   Cardiovascular: Negative.   Respiratory: Negative.   Endocrine: Negative.   Hematologic/Lymphatic: Negative.   Musculoskeletal: Negative.    Gastrointestinal: Negative.   Genitourinary: Negative.   Neurological: Negative.    All other systems reviewed and are negative.   PHYSICAL EXAM:   VS:  BP 138/60   Pulse 68   Ht 5\' 11"  (1.803 m)   Wt 168 lb 1.9 oz (76.3 kg)   SpO2 93%   BMI 23.45 kg/m   Physical  Exam  GEN: Well nourished, well developed, in no acute distress  Neck: no JVD, carotid bruits, or masses Cardiac:RRR; no murmurs, rubs, or gallops  Respiratory: Decreased breath sounds but clear to auscultation bilaterally, normal work of breathing GI: soft, nontender, nondistended, + BS Ext: without cyanosis, clubbing, or edema, Good distal pulses bilaterally Neuro:  Alert and Oriented x 3 Psych: euthymic mood, full affect  Wt Readings from Last 3 Encounters:  01/29/18 168 lb 1.9 oz (76.3 kg)  01/28/18 174 lb 2.6 oz (79 kg)  01/10/18 173 lb 0.6 oz (78.5 kg)      Studies/Labs Reviewed:   EKG:  EKG is not ordered today.  The ekg reviewed from ER and was unchanged Recent Labs: 09/26/2017: ALT 15 01/28/2018: BUN 16; Creatinine, Ser 1.58; Hemoglobin 14.3; Platelets 279; Potassium 4.1; Sodium 138   Lipid Panel    Component Value Date/Time   CHOL 151 09/26/2016 0920   TRIG 91.0 09/26/2016 0920   HDL 51.30 09/26/2016 0920   CHOLHDL 3 09/26/2016 0920   VLDL 18.2 09/26/2016 0920   LDLCALC 81 09/26/2016 0920    Additional studies/ records that were reviewed today include:   Echo 2017Study Conclusions   - Left ventricle: The cavity size was normal. There was mild focal   basal hypertrophy of the septum. Systolic function was normal.   The estimated ejection fraction was in the range of 55% to 60%.   Wall motion was normal; there were no regional wall motion   abnormalities. Doppler parameters are consistent with abnormal   left ventricular relaxation (grade 1 diastolic dysfunction).   Doppler parameters are consistent with high ventricular filling   pressure. - Mitral valve: There was mild regurgitation. -  Right ventricle: The cavity size was mildly dilated. - Right atrium: The atrium was mildly dilated.   Impressions:   - Normal LV systolic function; grade 1 diastolic dysfunction; mild   MR; mild RAE and RVE; trace TR.      ASSESSMENT:    1. Coronary artery disease involving native coronary artery of native heart without angina pectoris   2. Other chest pain   3. Essential hypertension   4. Mixed hyperlipidemia      PLAN:  In order of problems listed above:  Chest pain fleeting 2 episodes of shocklike pain lasting less than a second.  Emergency room EKG unchanged, troponins 0.  Physical exam unremarkable.  Patient has no other symptoms.  Noncardiac chest pain.  No further work-up at this time.  Follow-up with Dr. Angelena Form as scheduled.  CAD status post prior CABG on aspirin and statin.  No angina  Essential hypertension well-controlled  Hyperlipidemia on statin LDL 81 last year    Medication Adjustments/Labs and Tests Ordered: Current medicines are reviewed at length with the patient today.  Concerns regarding medicines are outlined above.  Medication changes, Labs and Tests ordered today are listed in the Patient Instructions below. Patient Instructions  Medication Instructions:  1. Your physician recommends that you continue on your current medications as directed. Please refer to the Current Medication list given to you today.  If you need a refill on your cardiac medications before your next appointment, please call your pharmacy.   Lab work: NONE ORDERED TODAY If you have labs (blood work) drawn today and your tests are completely normal, you will receive your results only by: Marland Kitchen MyChart Message (if you have MyChart) OR . A paper copy in the mail If you have any lab test that is  abnormal or we need to change your treatment, we will call you to review the results.  Testing/Procedures: NONE ORDERED TODAY  Follow-Up: At [facility], you and your health needs are  our priority.  As part of our continuing mission to provide you with exceptional heart care, we have created designated Provider Care Teams.  These Care Teams include your primary Cardiologist (physician) and Advanced Practice Providers (APPs -  Physician Assistants and Nurse Practitioners) who all work together to provide you with the care you need, when you need it. You will need a follow up appointment in 9 months.  Please call our office 2 months in advance to schedule this appointment.  You may see Lauree Chandler, MD or one of the following Advanced Practice Providers on your designated Care Team:   [place], PA-C Melina Copa, PA-C . Ermalinda Barrios, PA-C  Any Other Special Instructions Will Be Listed Below (If Applicable).       Sumner Boast, PA-C  01/29/2018 12:41 PM    [facility] [address], [place], [place]  [address] Phone: [phone]; Fax: [phone]

## 2018-01-29 NOTE — Patient Instructions (Signed)
Medication Instructions:  1. Your physician recommends that you continue on your current medications as directed. Please refer to the Current Medication list given to you today.  If you need a refill on your cardiac medications before your next appointment, please call your pharmacy.   Lab work: NONE ORDERED TODAY If you have labs (blood work) drawn today and your tests are completely normal, you will receive your results only by: Marland Kitchen MyChart Message (if you have MyChart) OR . A paper copy in the mail If you have any lab test that is abnormal or we need to change your treatment, we will call you to review the results.  Testing/Procedures: NONE ORDERED TODAY  Follow-Up: At [facility], you and your health needs are our priority.  As part of our continuing mission to provide you with exceptional heart care, we have created designated Provider Care Teams.  These Care Teams include your primary Cardiologist (physician) and Advanced Practice Providers (APPs -  Physician Assistants and Nurse Practitioners) who all work together to provide you with the care you need, when you need it. You will need a follow up appointment in 9 months.  Please call our office 2 months in advance to schedule this appointment.  You may see Lauree Chandler, MD or one of the following Advanced Practice Providers on your designated Care Team:   [place], PA-C Melina Copa, PA-C . Ermalinda Barrios, PA-C  Any Other Special Instructions Will Be Listed Below (If Applicable).

## 2018-02-03 ENCOUNTER — Other Ambulatory Visit: Payer: Self-pay | Admitting: Internal Medicine

## 2018-02-23 ENCOUNTER — Other Ambulatory Visit: Payer: Self-pay | Admitting: Internal Medicine

## 2018-03-01 ENCOUNTER — Other Ambulatory Visit: Payer: Self-pay | Admitting: Internal Medicine

## 2018-03-19 LAB — LIPID PANEL
HDL: 61 (ref 35–70)
LDL Cholesterol: 86
Triglycerides: 88 (ref 40–160)

## 2018-03-19 LAB — VITAMIN D 25 HYDROXY (VIT D DEFICIENCY, FRACTURES): Vit D, 25-Hydroxy: 39.31

## 2018-03-19 LAB — BASIC METABOLIC PANEL
BUN: 23 — AB (ref 4–21)
Creatinine: 1.5 — AB (ref <=1.3)

## 2018-03-19 LAB — HEMOGLOBIN A1C: Hemoglobin A1C: 6.4

## 2018-03-19 LAB — MICROALBUMIN, URINE: Microalb, Ur: 9.45

## 2018-03-31 NOTE — Patient Instructions (Addendum)
   Medications reviewed and updated.  Changes include :   Enalapril 10 mg twice a day.   Your prescription(s) have been submitted to your pharmacy. Please take as directed and contact our office if you believe you are having problem(s) with the medication(s).   Please followup in 6 months

## 2018-03-31 NOTE — Progress Notes (Signed)
Subjective:    Patient ID: Brian [name], male    DOB: Jun 22, 1931, 85 y.o.   MRN: [id number]  HPI The patient is here for follow up.  He also goes to the [place].    Diabetes: He is taking his medication daily as prescribed. He is compliant with a diabetic diet. He is walks regularly for exercise.  He had an A1c done recently at the [place] and it was 6.4%.  He checks his feet daily and denies foot lesions. He is up-to-date with an ophthalmology examination.   CAD, hypertension, Hyperlipidemia: He is taking his medication daily. He is compliant with a low fat/cholesterol and low sodium diet. He is exercising regularly - walks.  He denies chest pain, palpitations and edema.  He does monitor his BP at home.    COPD:  He uses his Anoro inhaler daily and albuterol inhaler as needed. He denies cough, wheeze, sob.  He feels his COPD is well controlled.  Insomnia;  He takes restoril as needed.   He tries not to take it too often.    CKD:   He drinks a lot of water daily.  He does not take any nsaids.   Moles on back - he denies changes, but they sometimes itch.      Medications and allergies reviewed with patient and updated if appropriate.  Patient Active Problem List   Diagnosis Date Noted  . Chest pain 01/29/2018  . Decreased hearing of right ear 11/23/2017  . Abdominal pain 05/28/2017  . Vertigo 07/17/2016  . Nocturnal leg cramps 09/20/2015  . COPD (chronic obstructive pulmonary disease) ([place]) 05/31/2015  . CKD (chronic kidney disease) stage 3, GFR 30-59 ml/min ([facility]) 05/31/2015  . Diabetes ([place]) 04/26/2015  . Epistaxis 04/05/2015  . AAA (abdominal aortic aneurysm) without rupture ([place]) 01/18/2015  . PAD (peripheral artery disease) ([place]) 01/18/2015  . Myalgia and myositis 07/02/2014  . Hereditary and idiopathic peripheral neuropathy 10/29/2012  . Hypertension 05/10/2011  . Bradycardia 05/10/2011  . CAD (coronary artery disease) 09/05/2010  . TINNITUS 04/04/2010  . CORONARY ARTERY  BYPASS GRAFT, HX OF 03/31/2010  . Disturbance in sleep behavior 12/28/2008  . Cervicalgia 07/26/2008  . POLYP, COLON 07/16/2007  . Hyperlipidemia 09/24/2006  . History of gout 09/23/2006    Current Outpatient Medications on File Prior to Visit  Medication Sig Dispense Refill  . ACCU-CHEK COMPACT STRIPS test strip TEST BLOOD SUGAR ONCE DAILY 100 each 1  . ANORO ELLIPTA 62.5-25 MCG/INH AEPB USE 1 INHALATION DAILY 120 each 2  . Aspirin (ADULT ASPIRIN LOW STRENGTH) 81 MG EC tablet Take 81 mg by mouth daily.      . cetirizine (ZYRTEC) 10 MG tablet Take 10 mg by mouth daily as needed for allergies.     . cholecalciferol (VITAMIN D) 1000 UNITS tablet Take 3,000 Units by mouth daily.     . enalapril (VASOTEC) 10 MG tablet TAKE ONE AND ONE-HALF TABLETS DAILY 135 tablet 1  . fluticasone (FLONASE) 50 MCG/ACT nasal spray Place 2 sprays into both nostrils daily as needed for allergies or rhinitis.     . Magnesium Oxide 420 MG TABS Take 420 mg by mouth daily.     . metoprolol succinate (TOPROL XL) 25 MG 24 hr tablet Take 0.5 tablets (12.5 mg total) by mouth daily. 45 tablet 1  . Multiple Vitamins-Minerals (MULTIVITAL PO) Take 1 tablet by mouth daily.     . simvastatin (ZOCOR) 40 MG tablet TAKE 1 TABLET DAILY 90 tablet  1  . temazepam (RESTORIL) 7.5 MG capsule Take 1 capsule (7.5 mg total) by mouth at bedtime as needed. (Patient taking differently: Take 7.5 mg by mouth at bedtime as needed for sleep. ) 90 capsule 1   No current facility-administered medications on file prior to visit.     Past Medical History:  Diagnosis Date  . AAA (abdominal aortic aneurysm) ([place])   . Asthma 1976  . Bronchiolitis May 2016  . CAD (coronary artery disease)    NSTEMI 02/2010;  s/p CABG 1/12 (L-LAD, SOM1, S-PDA); echo in 08/2010: Mild LVH, EF 56%, grade 2 diastolic dysfunction, mild MR, mild LAE, mildly reduced RV function, mild RAE, PASP 42  . Colon polyps   . COPD (chronic obstructive pulmonary disease) ([place])   .  Diabetes mellitus 2000   adult onset  . ED (erectile dysfunction)   . Gout   . HTN (hypertension)   . Hyperlipidemia   . Macular degeneration   . Myocardial infarction ([place]) 1 -13-2012  . Neuropathy     Past Surgical History:  Procedure Laterality Date  . 3 vessel CABG  03/03/2010  . cataract left eye    . COLONOSCOPY     neg 2009  . COLONOSCOPY  2014   5 mm sessile polyp; AVM  . CORONARY ARTERY BYPASS GRAFT  2012   X3  . EYE SURGERY    . INCISION AND DRAINAGE PERIRECTAL ABSCESS    . INGUINAL HERNIA REPAIR      Social History   Socioeconomic History  . Marital status: Widowed    Spouse name: Not on file  . Number of children: Not on file  . Years of education: Not on file  . Highest education level: Not on file  Occupational History  . Occupation: RETIRED    Employer: RETIRED  Social Needs  . Financial resource strain: Not on file  . Food insecurity:    Worry: Not on file    Inability: Not on file  . Transportation needs:    Medical: Not on file    Non-medical: Not on file  Tobacco Use  . Smoking status: Former Smoker    Types: Cigarettes    Last attempt to quit: 02/20/2003    Years since quitting: 15.1  . Smokeless tobacco: Never Used  Substance and Sexual Activity  . Alcohol use: Yes    Alcohol/week: 6.0 standard drinks    Types: 3 Glasses of wine, 3 Shots of liquor per week    Comment:  1-3 drinks a week  . Drug use: No  . Sexual activity: Not on file  Lifestyle  . Physical activity:    Days per week: Not on file    Minutes per session: Not on file  . Stress: Not on file  Relationships  . Social connections:    Talks on phone: Not on file    Gets together: Not on file    Attends religious service: Not on file    Active member of club or organization: Not on file    Attends meetings of clubs or organizations: Not on file    Relationship status: Not on file  Other Topics Concern  . Not on file  Social History Narrative   Quit smoking in 1998.  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     Family History  Problem Relation Age of Onset  . Heart failure Mother   . Heart disease Mother        CHF  . Heart  attack Mother   . Hypertension Mother   . Heart attack Father   . Heart disease Father 52       MI  . Hypertension Father   . Heart attack Sister 105  . Heart disease Sister 53       MI  . Diabetes Sister   . Hyperlipidemia Sister   . Hypertension Sister   . Varicose Veins Sister   . Heart attack Brother 17  . Diabetes Brother   . Heart disease Brother 13       MI  . Cancer Brother   . Hypertension Brother   . Deep vein thrombosis Daughter   . Other Son        varicose veins  . Colon cancer Neg Hx     Review of Systems  Constitutional: Negative for chills and fever.  Respiratory: Negative for cough, shortness of breath and wheezing.   Cardiovascular: Negative for chest pain, palpitations and leg swelling.  Neurological: Negative for dizziness, light-headedness and headaches.       Objective:   Vitals:   04/01/18 0821  BP: (!) 164/78  Pulse: (!) 51  Resp: 16  Temp: 97.7 F (36.5 C)  SpO2: 96%   BP Readings from Last 3 Encounters:  04/01/18 (!) 164/78  01/29/18 138/60  01/28/18 (!) 168/78   Wt Readings from Last 3 Encounters:  04/01/18 174 lb (78.9 kg)  01/29/18 168 lb 1.9 oz (76.3 kg)  01/28/18 174 lb 2.6 oz (79 kg)   Body mass index is 24.27 kg/m.   Physical Exam    Constitutional: Appears well-developed and well-nourished. No distress.  HENT:  Head: Normocephalic and atraumatic.  Neck: Neck supple. No tracheal deviation present. No thyromegaly present.  No cervical lymphadenopathy Cardiovascular: Normal rate, regular rhythm and normal heart sounds.   No murmur heard. No carotid bruit .  No edema Pulmonary/Chest: Effort normal and breath sounds normal. No respiratory distress. No has no wheezes. No rales.  Skin: Skin is warm and dry. Not diaphoretic.  Normal-appearing moles on  back. Psychiatric: Normal mood and affect. Behavior is normal.      Assessment & Plan:    See Problem List for Assessment and Plan of chronic medical problems.

## 2018-04-01 ENCOUNTER — Ambulatory Visit (INDEPENDENT_AMBULATORY_CARE_PROVIDER_SITE_OTHER): Payer: Medicare Other | Admitting: Internal Medicine

## 2018-04-01 ENCOUNTER — Encounter: Payer: Self-pay | Admitting: Internal Medicine

## 2018-04-01 VITALS — BP 164/78 | HR 51 | Temp 97.7°F | Resp 16 | Ht 71.0 in | Wt 174.0 lb

## 2018-04-01 DIAGNOSIS — N183 Chronic kidney disease, stage 3 unspecified: Secondary | ICD-10-CM

## 2018-04-01 DIAGNOSIS — J439 Emphysema, unspecified: Secondary | ICD-10-CM | POA: Diagnosis not present

## 2018-04-01 DIAGNOSIS — E782 Mixed hyperlipidemia: Secondary | ICD-10-CM

## 2018-04-01 DIAGNOSIS — I10 Essential (primary) hypertension: Secondary | ICD-10-CM

## 2018-04-01 DIAGNOSIS — E1122 Type 2 diabetes mellitus with diabetic chronic kidney disease: Secondary | ICD-10-CM

## 2018-04-01 DIAGNOSIS — I2510 Atherosclerotic heart disease of native coronary artery without angina pectoris: Secondary | ICD-10-CM

## 2018-04-01 DIAGNOSIS — N182 Chronic kidney disease, stage 2 (mild): Secondary | ICD-10-CM

## 2018-04-01 MED ORDER — ENALAPRIL MALEATE 10 MG PO TABS: 10.0000 mg | ORAL_TABLET | Freq: Two times a day (BID) | ORAL | 1 refills | Status: DC

## 2018-04-01 NOTE — Assessment & Plan Note (Signed)
Last a1c 6.4% - well controlled Diet controlled Eye exams up-to-date Follow-up in 6 months

## 2018-04-01 NOTE — Assessment & Plan Note (Signed)
No chest pain, sob or palpitations Continue ASA, statin

## 2018-04-01 NOTE — Assessment & Plan Note (Signed)
Continue statin Had recent blood work done at the [place]

## 2018-04-01 NOTE — Assessment & Plan Note (Signed)
Well controlled Continue Anoro daily Albuterol as needed

## 2018-04-01 NOTE — Assessment & Plan Note (Signed)
Mild chronic kidney disease Reviewed most recent blood work Drinking plenty of fluids Does not take any NSAIDs

## 2018-04-01 NOTE — Assessment & Plan Note (Addendum)
Blood pressure elevated here today and has been fairly elevated Will increase enalapril to 10 mg twice daily Monitor at home Recent cmp reviewed

## 2018-04-09 ENCOUNTER — Encounter: Payer: Self-pay | Admitting: Internal Medicine

## 2018-04-09 LAB — MICROALBUMIN/CREATININE RATIO, UR: MICROALB/CREAT RATIO: 60.6

## 2018-07-12 ENCOUNTER — Other Ambulatory Visit: Payer: Self-pay | Admitting: Internal Medicine

## 2018-08-23 ENCOUNTER — Other Ambulatory Visit: Payer: Self-pay | Admitting: Internal Medicine

## 2018-08-30 ENCOUNTER — Other Ambulatory Visit: Payer: Self-pay | Admitting: Internal Medicine

## 2018-09-02 ENCOUNTER — Telehealth: Payer: Self-pay | Admitting: Internal Medicine

## 2018-09-02 NOTE — Telephone Encounter (Signed)
Left message for patient to call office to schedule AWV. SF

## 2018-09-10 ENCOUNTER — Other Ambulatory Visit: Payer: Self-pay | Admitting: Internal Medicine

## 2018-09-29 NOTE — Progress Notes (Signed)
Subjective:    Patient ID: Brian [name], male    DOB: 07/26/1931, 85 y.o.   MRN: [id number]  HPI The patient is here for follow up.  He is not exercising regularly.    Right thumb MCP locking up and pain.    Muscle cramping in ankle or calves at night.  Sometimes in both legs.  It is not every night.   Walking helps.  Yellow mustard helps. He drinks a good amount of water daily.  This is not a new problem.  Pain under his left ribs  It is intermittent.  It will show up for 3 weeks out of the month and then goes away.  It will come back after 2-3 weeks.  His first episode was at least 2 years ago.  He only feels it when he is sitting - not when he is moving around.  Nothing makes it worse.  Nothing makes it better.  He has had a CT of his abdomen and pelvis once a year by vascular to monitor his aneurysm.  His last CT scan on file did not show any abnormalities in this area.  Diabetes: He is controlling his sugars with lifestyle. He is compliant with a diabetic diet. He checks his feet daily and denies foot lesions. He is up-to-date with an ophthalmology examination.  Sugars at home are typically 107-129   CAD, hyperlipidemia, Hypertension: He is taking his medication daily. He is compliant with a low sodium diet.  He denies chest pain, palpitations, edema and regular headaches.  He does monitor his blood pressure at home -- 112/54 - 146/65    COPD:  He uses his Anoro daily and albuterol prn.  He feels his COPD is well controlled.  He denies wheezing.  He has a chronic cough and shortness of breath that are stable.  He avoids going outside with the heat and humidity.  Insomnia:   He takes restoril as needed.  It works well when he takes it.    CKD:  He avoids nsaids.  He does drink plenty of water throughout the day.    Medications and allergies reviewed with patient and updated if appropriate.  Patient Active Problem List   Diagnosis Date Noted  . Low vitamin B12 level  09/30/2018  . Chest pain 01/29/2018  . Decreased hearing of right ear 11/23/2017  . Vertigo 07/17/2016  . Nocturnal leg cramps 09/20/2015  . COPD (chronic obstructive pulmonary disease) ([place]) 05/31/2015  . CKD (chronic kidney disease) stage 3, GFR 30-59 ml/min (HCC) 05/31/2015  . Diabetes ([place]) 04/26/2015  . Epistaxis 04/05/2015  . AAA (abdominal aortic aneurysm) without rupture ([place]) 01/18/2015  . PAD (peripheral artery disease) ([place]) 01/18/2015  . Hereditary and idiopathic peripheral neuropathy 10/29/2012  . Hypertension 05/10/2011  . Bradycardia 05/10/2011  . CAD (coronary artery disease) 09/05/2010  . TINNITUS 04/04/2010  . CORONARY ARTERY BYPASS GRAFT, HX OF 03/31/2010  . Disturbance in sleep behavior 12/28/2008  . Cervicalgia 07/26/2008  . POLYP, COLON 07/16/2007  . Hyperlipidemia 09/24/2006  . History of gout 09/23/2006    Current Outpatient Medications on File Prior to Visit  Medication Sig Dispense Refill  . ACCU-CHEK COMPACT STRIPS test strip TEST BLOOD SUGAR ONCE DAILY 100 each 1  . ANORO ELLIPTA 62.5-25 MCG/INH AEPB USE 1 INHALATION DAILY 120 each 5  . Aspirin (ADULT ASPIRIN LOW STRENGTH) 81 MG EC tablet Take 81 mg by mouth daily.      . cetirizine (ZYRTEC) 10 MG tablet  Take 10 mg by mouth daily as needed for allergies.     . cholecalciferol (VITAMIN D) 1000 UNITS tablet Take 3,000 Units by mouth daily.     . enalapril (VASOTEC) 10 MG tablet TAKE 1 TABLET TWICE A DAY 180 tablet 0  . fluticasone (FLONASE) 50 MCG/ACT nasal spray Place 2 sprays into both nostrils daily as needed for allergies or rhinitis.     . Magnesium Oxide 420 MG TABS Take 420 mg by mouth daily.     . Multiple Vitamins-Minerals (MULTIVITAL PO) Take 1 tablet by mouth daily.     . simvastatin (ZOCOR) 40 MG tablet TAKE 1 TABLET DAILY 90 tablet 3  . temazepam (RESTORIL) 7.5 MG capsule Take 1 capsule (7.5 mg total) by mouth at bedtime as needed. (Patient taking differently: Take 7.5 mg by mouth at bedtime  as needed for sleep. ) 90 capsule 1  . TOPROL XL 25 MG 24 hr tablet TAKE ONE-HALF (1/2) TABLET DAILY 45 tablet 1   No current facility-administered medications on file prior to visit.     Past Medical History:  Diagnosis Date  . AAA (abdominal aortic aneurysm) ([place])   . Asthma 1976  . Bronchiolitis May 2016  . CAD (coronary artery disease)    NSTEMI 02/2010;  s/p CABG 1/12 (L-LAD, SOM1, S-PDA); echo in 08/2010: Mild LVH, EF 42%, grade 2 diastolic dysfunction, mild MR, mild LAE, mildly reduced RV function, mild RAE, PASP 42  . Colon polyps   . COPD (chronic obstructive pulmonary disease) ([place])   . Diabetes mellitus 2000   adult onset  . ED (erectile dysfunction)   . Gout   . HTN (hypertension)   . Hyperlipidemia   . Macular degeneration   . Myocardial infarction ([place]) 1 -13-2012  . Neuropathy     Past Surgical History:  Procedure Laterality Date  . 3 vessel CABG  03/03/2010  . cataract left eye    . COLONOSCOPY     neg 2009  . COLONOSCOPY  2014   5 mm sessile polyp; AVM  . CORONARY ARTERY BYPASS GRAFT  2012   X3  . EYE SURGERY    . INCISION AND DRAINAGE PERIRECTAL ABSCESS    . INGUINAL HERNIA REPAIR      Social History   Socioeconomic History  . Marital status: Widowed    Spouse name: Not on file  . Number of children: Not on file  . Years of education: Not on file  . Highest education level: Not on file  Occupational History  . Occupation: RETIRED    Employer: RETIRED  Social Needs  . Financial resource strain: Not on file  . Food insecurity    Worry: Not on file    Inability: Not on file  . Transportation needs    Medical: Not on file    Non-medical: Not on file  Tobacco Use  . Smoking status: Former Smoker    Types: Cigarettes    Quit date: 02/20/2003    Years since quitting: 15.6  . Smokeless tobacco: Never Used  Substance and Sexual Activity  . Alcohol use: Yes    Alcohol/week: 6.0 standard drinks    Types: 3 Glasses of wine, 3 Shots of liquor per  week    Comment:  1-3 drinks a week  . Drug use: No  . Sexual activity: Not on file  Lifestyle  . Physical activity    Days per week: Not on file    Minutes per session: Not on file  .  Stress: Not on file  Relationships  . Social Herbalist on phone: Not on file    Gets together: Not on file    Attends religious service: Not on file    Active member of club or organization: Not on file    Attends meetings of clubs or organizations: Not on file    Relationship status: Not on file  Other Topics Concern  . 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     Family History  Problem Relation Age of Onset  . Heart failure Mother   . Heart disease Mother        CHF  . Heart attack Mother   . Hypertension Mother   . Heart attack Father   . Heart disease Father 2       MI  . Hypertension Father   . Heart attack Sister 65  . Heart disease Sister 55       MI  . Diabetes Sister   . Hyperlipidemia Sister   . Hypertension Sister   . Varicose Veins Sister   . Heart attack Brother 42  . Diabetes Brother   . Heart disease Brother 72       MI  . Cancer Brother   . Hypertension Brother   . Deep vein thrombosis Daughter   . Other Son        varicose veins  . Colon cancer Neg Hx     Review of Systems  Constitutional: Negative for chills and fever.  Respiratory: Positive for cough (occ) and shortness of breath (with exertion - COPD related, stable). Negative for wheezing.   Cardiovascular: Negative for chest pain, palpitations and leg swelling.  Musculoskeletal: Positive for arthralgias.  Neurological: Negative for light-headedness and headaches.       Objective:   Vitals:   09/30/18 0843  BP: (!) 168/72  Pulse: 70  Resp: 16  Temp: (!) 97.5 F (36.4 C)  SpO2: 95%   BP Readings from Last 3 Encounters:  09/30/18 (!) 168/72  04/01/18 (!) 164/78  01/29/18 138/60   Wt Readings from Last 3 Encounters:   09/30/18 169 lb (76.7 kg)  04/01/18 174 lb (78.9 kg)  01/29/18 168 lb 1.9 oz (76.3 kg)   Body mass index is 23.57 kg/m.   Physical Exam    Constitutional: Appears well-developed and well-nourished. No distress.  HENT:  Head: Normocephalic and atraumatic.  Neck: Neck supple. No tracheal deviation present. No thyromegaly present.  No cervical lymphadenopathy Cardiovascular: Normal rate, regular rhythm and normal heart sounds.   No murmur heard. No carotid bruit .  No edema Pulmonary/Chest: No rib pain.  Effort normal and breath sounds normal. No respiratory distress. No has no wheezes. No rales.  Abdomen: soft, NT, ND Skin: Skin is warm and dry. Not diaphoretic.  Psychiatric: Normal mood and affect. Behavior is normal.      Assessment & Plan:    See Problem List for Assessment and Plan of chronic medical problems.

## 2018-09-29 NOTE — Patient Instructions (Addendum)
  Tests ordered today. Your results will be released to MyChart (or called to you) after review.  If any changes need to be made, you will be notified at that same time.    Medications reviewed and updated.  Changes include :   none     Please followup in 6 months   

## 2018-09-30 ENCOUNTER — Other Ambulatory Visit (INDEPENDENT_AMBULATORY_CARE_PROVIDER_SITE_OTHER): Payer: Medicare Other

## 2018-09-30 ENCOUNTER — Other Ambulatory Visit: Payer: Self-pay

## 2018-09-30 ENCOUNTER — Encounter: Payer: Self-pay | Admitting: Internal Medicine

## 2018-09-30 ENCOUNTER — Ambulatory Visit (INDEPENDENT_AMBULATORY_CARE_PROVIDER_SITE_OTHER): Payer: Medicare Other | Admitting: Internal Medicine

## 2018-09-30 VITALS — BP 168/72 | HR 70 | Temp 97.5°F | Resp 16 | Ht 71.0 in | Wt 169.0 lb

## 2018-09-30 DIAGNOSIS — E538 Deficiency of other specified B group vitamins: Secondary | ICD-10-CM

## 2018-09-30 DIAGNOSIS — J439 Emphysema, unspecified: Secondary | ICD-10-CM

## 2018-09-30 DIAGNOSIS — N183 Chronic kidney disease, stage 3 unspecified: Secondary | ICD-10-CM

## 2018-09-30 DIAGNOSIS — I2510 Atherosclerotic heart disease of native coronary artery without angina pectoris: Secondary | ICD-10-CM | POA: Diagnosis not present

## 2018-09-30 DIAGNOSIS — E782 Mixed hyperlipidemia: Secondary | ICD-10-CM | POA: Diagnosis not present

## 2018-09-30 DIAGNOSIS — G4762 Sleep related leg cramps: Secondary | ICD-10-CM | POA: Diagnosis not present

## 2018-09-30 DIAGNOSIS — R1012 Left upper quadrant pain: Secondary | ICD-10-CM | POA: Insufficient documentation

## 2018-09-30 DIAGNOSIS — N182 Chronic kidney disease, stage 2 (mild): Secondary | ICD-10-CM | POA: Diagnosis not present

## 2018-09-30 DIAGNOSIS — M1810 Unilateral primary osteoarthritis of first carpometacarpal joint, unspecified hand: Secondary | ICD-10-CM

## 2018-09-30 DIAGNOSIS — E1122 Type 2 diabetes mellitus with diabetic chronic kidney disease: Secondary | ICD-10-CM | POA: Diagnosis not present

## 2018-09-30 DIAGNOSIS — G479 Sleep disorder, unspecified: Secondary | ICD-10-CM

## 2018-09-30 DIAGNOSIS — I10 Essential (primary) hypertension: Secondary | ICD-10-CM

## 2018-09-30 LAB — COMPREHENSIVE METABOLIC PANEL
ALT: 12 U/L (ref 0–53)
AST: 15 U/L (ref 0–37)
Albumin: 4.4 g/dL (ref 3.5–5.2)
Alkaline Phosphatase: 59 U/L (ref 39–117)
BUN: 20 mg/dL (ref 6–23)
CO2: 26 mEq/L (ref 19–32)
Calcium: 9.7 mg/dL (ref 8.4–10.5)
Chloride: 101 mEq/L (ref 96–112)
Creatinine, Ser: 1.51 mg/dL — ABNORMAL HIGH (ref 0.40–1.50)
GFR: 53.45 mL/min — ABNORMAL LOW (ref >60.00)
Glucose, Bld: 108 mg/dL — ABNORMAL HIGH (ref 70–99)
Potassium: 4.6 mEq/L (ref 3.5–5.1)
Sodium: 135 mEq/L (ref 135–145)
Total Bilirubin: 0.8 mg/dL (ref 0.2–1.2)
Total Protein: 7.2 g/dL (ref 6.0–8.3)

## 2018-09-30 LAB — CBC WITH DIFFERENTIAL/PLATELET
Basophils Absolute: 0.1 10*3/uL (ref 0.0–0.1)
Basophils Relative: 1.1 % (ref 0.0–3.0)
Eosinophils Absolute: 0.3 10*3/uL (ref 0.0–0.7)
Eosinophils Relative: 5.1 % — ABNORMAL HIGH (ref 0.0–5.0)
HCT: 40.9 % (ref 39.0–52.0)
Hemoglobin: 13.6 g/dL (ref 13.0–17.0)
Lymphocytes Relative: 30.4 % (ref 12.0–46.0)
Lymphs Abs: 1.7 10*3/uL (ref 0.7–4.0)
MCHC: 33.3 g/dL (ref 30.0–36.0)
MCV: 93.6 fl (ref 78.0–100.0)
Monocytes Absolute: 0.6 10*3/uL (ref 0.1–1.0)
Monocytes Relative: 11.2 % (ref 3.0–12.0)
Neutro Abs: 2.9 10*3/uL (ref 1.4–7.7)
Neutrophils Relative %: 52.2 % (ref 43.0–77.0)
Platelets: 199 10*3/uL (ref 150.0–400.0)
RBC: 4.37 Mil/uL (ref 4.22–5.81)
RDW: 13 % (ref 11.5–15.5)
WBC: 5.5 10*3/uL (ref 4.0–10.5)

## 2018-09-30 LAB — LIPID PANEL
Cholesterol: 130 mg/dL (ref 0–200)
HDL: 51.9 mg/dL (ref >39.00)
LDL Cholesterol: 61 mg/dL (ref 0–99)
NonHDL: 77.61
Total CHOL/HDL Ratio: 2
Triglycerides: 85 mg/dL (ref 0.0–149.0)
VLDL: 17 mg/dL (ref 0.0–40.0)

## 2018-09-30 LAB — MAGNESIUM: Magnesium: 1.9 mg/dL (ref 1.5–2.5)

## 2018-09-30 LAB — VITAMIN B12: Vitamin B-12: 547 pg/mL (ref 211–911)

## 2018-09-30 LAB — HEMOGLOBIN A1C: Hgb A1c MFr Bld: 6.3 % (ref 4.6–6.5)

## 2018-09-30 NOTE — Assessment & Plan Note (Signed)
Well-controlled at home-he brings in his measurements Probable element of whitecoat hypertension Continue current medications at current doses CMP

## 2018-09-30 NOTE — Assessment & Plan Note (Signed)
Avoid NSAIDs, drinks plenty of water CMP

## 2018-09-30 NOTE — Assessment & Plan Note (Signed)
B12 level 

## 2018-09-30 NOTE — Assessment & Plan Note (Signed)
Nocturnal cramps in ankles and calves-not nightly Compression socks during the day seems to his help-we will continue He is drinking plenty of water CMP, magnesium We will continue yellow mustard as needed

## 2018-09-30 NOTE — Assessment & Plan Note (Signed)
Stable, overall controlled Continue current inhalers

## 2018-09-30 NOTE — Assessment & Plan Note (Signed)
Intermittent for at least 2 years Pain is located in the left upper quadrant, he only feels the pain when sitting, nothing makes the pain better or worse He has had annual CT scans for monitoring of his aneurysm and there have been no abnormalities in that region Sounds muscular skeletal since he only experiences it when he is sitting and at no other time Symptomatic treatment

## 2018-09-30 NOTE — Assessment & Plan Note (Signed)
Diet controlled.  Check A1c.

## 2018-09-30 NOTE — Assessment & Plan Note (Signed)
Continue statin Check lipid panel, CMP

## 2018-09-30 NOTE — Assessment & Plan Note (Signed)
Locking up and pain of the MCP joint is likely related to arthritis He will try topical arthritis medication Can refer to Ortho if needed

## 2018-09-30 NOTE — Assessment & Plan Note (Signed)
Denies any chest pain Continue statin, aspirin and beta-blocker CBC, CMP, lipid

## 2018-09-30 NOTE — Assessment & Plan Note (Signed)
Takes Restoril only as needed-he tries to avoid taking it too much No side effects, effective Continue

## 2018-10-01 ENCOUNTER — Encounter: Payer: Self-pay | Admitting: Internal Medicine

## 2018-10-15 ENCOUNTER — Encounter: Payer: Self-pay | Admitting: Internal Medicine

## 2018-10-15 ENCOUNTER — Ambulatory Visit (INDEPENDENT_AMBULATORY_CARE_PROVIDER_SITE_OTHER): Payer: Medicare Other | Admitting: Internal Medicine

## 2018-10-15 ENCOUNTER — Ambulatory Visit: Payer: Self-pay | Admitting: *Deleted

## 2018-10-15 ENCOUNTER — Other Ambulatory Visit: Payer: Self-pay

## 2018-10-15 VITALS — BP 160/68 | HR 65 | Temp 98.0°F | Resp 16 | Ht 71.0 in | Wt 168.0 lb

## 2018-10-15 DIAGNOSIS — M792 Neuralgia and neuritis, unspecified: Secondary | ICD-10-CM | POA: Diagnosis not present

## 2018-10-15 DIAGNOSIS — Z23 Encounter for immunization: Secondary | ICD-10-CM

## 2018-10-15 MED ORDER — GABAPENTIN 100 MG PO CAPS: 100.0000 mg | ORAL_CAPSULE | Freq: Every day | ORAL | 3 refills | Status: DC

## 2018-10-15 NOTE — Assessment & Plan Note (Deleted)
Having nerve pain

## 2018-10-15 NOTE — Telephone Encounter (Signed)
Patient is calling with complaints of sharp pain in 3 toes of L foot and pain in middle finger of L hand. Pain is not radiating and comes and goes- it is a 10 and did not allow him to sleep last night. No swelling in the area noted and no injury to the area.  Reason for Disposition . [1] SEVERE pain (e.g., excruciating, unable to do any normal activities) AND [2] not improved after 2 hours of pain medicine  Answer Assessment - Initial Assessment Questions 1. ONSET: "When did the pain start?"      Pain started 8/14- 8/22 became daily 2. LOCATION: "Where is the pain located?"      3 toes in left foot 3. PAIN: "How bad is the pain?"    (Scale 1-10; or mild, moderate, severe)   -  MILD (1-3): doesn't interfere with normal activities    -  MODERATE (4-7): interferes with normal activities (e.g., work or school) or awakens from sleep, limping    -  SEVERE (8-10): excruciating pain, unable to do any normal activities, unable to walk     Feels sharp pains in 3 toes of L foot- 10- comes and goes 4. WORK OR EXERCISE: "Has there been any recent work or exercise that involved this part of the body?"      no 5. CAUSE: "What do you think is causing the foot pain?"     unsure 6. OTHER SYMPTOMS: "Do you have any other symptoms?" (e.g., leg pain, rash, fever, numbness)     Pain in L middle finger- came 8/24- pain in finger only twice 7. PREGNANCY: "Is there any chance you are pregnant?" "When was your last menstrual period?"     n/a  Protocols used: FOOT PAIN-A-AH

## 2018-10-15 NOTE — Telephone Encounter (Signed)
Seeing you at 10:00

## 2018-10-15 NOTE — Patient Instructions (Signed)
Start taking gabapentin at night - you can take 1-2 pills.     A referral was ordered for neurology.  They will call you.    Call with any questions or concerns.

## 2018-10-15 NOTE — Progress Notes (Signed)
Subjective:    Patient ID: Brian [name], male    DOB: Oct 31, 1931, 85 y.o.   MRN: [id number]  HPI The patient is here for an acute visit.   His symptoms started around 8/14 and got bad 8/22.  His lateral three toes and left middle finger are affected.  His left middle finger has only had it twice.  It is a sharp, needle like pain.  It is intermittent and lasts up to 30 seconds and then goes away.  It can come several times.  It is more active when he is sitting or laying down.  He has never had pain like this before - he barely slept last night - the pain was severe and came several times.  At night he either has to get up and walk around or change positions-sometimes that helps.  He denies any swelling in the affected digits or changes in color.  He denies anything new or unusual as far as activity.  He has not had any fevers or chills.  He denies any neck pain or back pain.  Years ago he was diagnosed with neuropathy, but has not had any symptoms.  Currently has no pain.  Medications and allergies reviewed with patient and updated if appropriate.  Patient Active Problem List   Diagnosis Date Noted  . Low vitamin B12 level 09/30/2018  . LUQ pain 09/30/2018  . Arthritis of carpometacarpal (CMC) joint of thumb 09/30/2018  . Chest pain 01/29/2018  . Decreased hearing of right ear 11/23/2017  . Vertigo 07/17/2016  . Nocturnal leg cramps 09/20/2015  . COPD (chronic obstructive pulmonary disease) ([place]) 05/31/2015  . CKD (chronic kidney disease) stage 3, GFR 30-59 ml/min (HCC) 05/31/2015  . Diabetes ([place]) 04/26/2015  . AAA (abdominal aortic aneurysm) without rupture ([place]) 01/18/2015  . PAD (peripheral artery disease) ([place]) 01/18/2015  . Hereditary and idiopathic peripheral neuropathy 10/29/2012  . Hypertension 05/10/2011  . Bradycardia 05/10/2011  . CAD (coronary artery disease) 09/05/2010  . TINNITUS 04/04/2010  . CORONARY ARTERY BYPASS GRAFT, HX OF 03/31/2010  . Disturbance in  sleep behavior 12/28/2008  . Cervicalgia 07/26/2008  . POLYP, COLON 07/16/2007  . Hyperlipidemia 09/24/2006  . History of gout 09/23/2006    Current Outpatient Medications on File Prior to Visit  Medication Sig Dispense Refill  . ACCU-CHEK COMPACT STRIPS test strip TEST BLOOD SUGAR ONCE DAILY 100 each 1  . ANORO ELLIPTA 62.5-25 MCG/INH AEPB USE 1 INHALATION DAILY 120 each 5  . Aspirin (ADULT ASPIRIN LOW STRENGTH) 81 MG EC tablet Take 81 mg by mouth daily.      . cetirizine (ZYRTEC) 10 MG tablet Take 10 mg by mouth daily as needed for allergies.     . cholecalciferol (VITAMIN D) 1000 UNITS tablet Take 3,000 Units by mouth daily.     . enalapril (VASOTEC) 10 MG tablet TAKE 1 TABLET TWICE A DAY 180 tablet 0  . fluticasone (FLONASE) 50 MCG/ACT nasal spray Place 2 sprays into both nostrils daily as needed for allergies or rhinitis.     . Magnesium Oxide 420 MG TABS Take 420 mg by mouth daily.     . Multiple Vitamins-Minerals (MULTIVITAL PO) Take 1 tablet by mouth daily.     . simvastatin (ZOCOR) 40 MG tablet TAKE 1 TABLET DAILY 90 tablet 3  . temazepam (RESTORIL) 7.5 MG capsule Take 1 capsule (7.5 mg total) by mouth at bedtime as needed. (Patient taking differently: Take 7.5 mg by mouth at bedtime as needed  for sleep. ) 90 capsule 1  . TOPROL XL 25 MG 24 hr tablet TAKE ONE-HALF (1/2) TABLET DAILY 45 tablet 1   No current facility-administered medications on file prior to visit.     Past Medical History:  Diagnosis Date  . AAA (abdominal aortic aneurysm) ([place])   . Asthma 1976  . Bronchiolitis May 2016  . CAD (coronary artery disease)    NSTEMI 02/2010;  s/p CABG 1/12 (L-LAD, SOM1, S-PDA); echo in 08/2010: Mild LVH, EF 38%, grade 2 diastolic dysfunction, mild MR, mild LAE, mildly reduced RV function, mild RAE, PASP 42  . Colon polyps   . COPD (chronic obstructive pulmonary disease) ([place])   . Diabetes mellitus 2000   adult onset  . ED (erectile dysfunction)   . Gout   . HTN (hypertension)    . Hyperlipidemia   . Macular degeneration   . Myocardial infarction ([place]) 1 -13-2012  . Neuropathy     Past Surgical History:  Procedure Laterality Date  . 3 vessel CABG  03/03/2010  . cataract left eye    . COLONOSCOPY     neg 2009  . COLONOSCOPY  2014   5 mm sessile polyp; AVM  . CORONARY ARTERY BYPASS GRAFT  2012   X3  . EYE SURGERY    . INCISION AND DRAINAGE PERIRECTAL ABSCESS    . INGUINAL HERNIA REPAIR      Social History   Socioeconomic History  . Marital status: Widowed    Spouse name: Not on file  . Number of children: Not on file  . Years of education: Not on file  . Highest education level: Not on file  Occupational History  . Occupation: RETIRED    Employer: RETIRED  Social Needs  . Financial resource strain: Not on file  . Food insecurity    Worry: Not on file    Inability: Not on file  . Transportation needs    Medical: Not on file    Non-medical: Not on file  Tobacco Use  . Smoking status: Former Smoker    Types: Cigarettes    Quit date: 02/20/2003    Years since quitting: 15.6  . Smokeless tobacco: Never Used  Substance and Sexual Activity  . Alcohol use: Yes    Alcohol/week: 6.0 standard drinks    Types: 3 Glasses of wine, 3 Shots of liquor per week    Comment:  1-3 drinks a week  . Drug use: No  . Sexual activity: Not on file  Lifestyle  . Physical activity    Days per week: Not on file    Minutes per session: Not on file  . Stress: Not on file  Relationships  . Social Herbalist on phone: Not on file    Gets together: Not on file    Attends religious service: Not on file    Active member of club or organization: Not on file    Attends meetings of clubs or organizations: Not on file    Relationship status: Not on file  Other Topics Concern  . 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     Family History  Problem Relation Age of Onset  . Heart failure  Mother   . Heart disease Mother        CHF  . Heart attack Mother   . Hypertension Mother   . Heart attack Father   . Heart  disease Father 23       MI  . Hypertension Father   . Heart attack Sister 70  . Heart disease Sister 87       MI  . Diabetes Sister   . Hyperlipidemia Sister   . Hypertension Sister   . Varicose Veins Sister   . Heart attack Brother 22  . Diabetes Brother   . Heart disease Brother 31       MI  . Cancer Brother   . Hypertension Brother   . Deep vein thrombosis Daughter   . Other Son        varicose veins  . Colon cancer Neg Hx     Review of Systems  Constitutional: Negative for chills and fever.  Musculoskeletal: Negative for back pain and neck pain.  Neurological: Negative for light-headedness, numbness and headaches.       Objective:   Vitals:   10/15/18 0956  BP: (!) 160/68  Pulse: 65  Resp: 16  Temp: 98 F (36.7 C)  SpO2: 96%   BP Readings from Last 3 Encounters:  10/15/18 (!) 160/68  09/30/18 (!) 168/72  04/01/18 (!) 164/78   Wt Readings from Last 3 Encounters:  10/15/18 168 lb (76.2 kg)  09/30/18 169 lb (76.7 kg)  04/01/18 174 lb (78.9 kg)   Body mass index is 23.43 kg/m.   Physical Exam Constitutional:      General: He is not in acute distress.    Appearance: Normal appearance. He is not ill-appearing.  HENT:     Head: Normocephalic and atraumatic.  Musculoskeletal:     Right lower leg: No edema.     Left lower leg: No edema.  Skin:    General: Skin is warm and dry.     Findings: No erythema or rash.  Neurological:     General: No focal deficit present.     Mental Status: He is alert and oriented to person, place, and time.     Sensory: No sensory deficit.     Motor: No weakness.            Assessment & Plan:    See Problem List for Assessment and Plan of chronic medical problems.

## 2018-10-15 NOTE — Assessment & Plan Note (Signed)
His symptoms are consistent with nerve pain Unsure why he is suddenly and experiencing nerve pain in this distribution Apparently was diagnosed with neuropathy several years ago at the [place], but has not had any symptoms Will refer to neurology for further evaluation Trial of gabapentin at bedtime-100-200 mg nightly Discussed possible side effects Discussed this dose can be tapered if needed Advised him to call with any questions or concerns

## 2018-10-15 NOTE — Progress Notes (Signed)
Chief Complaint  Patient presents with  . Follow-up    CAD   History of Present Illness: 85 yo male with h/o HTN, hyperlipidemia, DM, COPD and CAD s/p CABG here today for cardiac follow up. He was admitted to [facility] January 2012 with a NSTEMI. Cardiac cath January 2012 with severe triple vessel CAD. He underwent 3V CABG on 03/03/10. AAA followed in [facility].  Echo March 2017 with normal LV size and function, mild MR.   He is here today for follow up. The patient denies any chest pain, dyspnea, palpitations, lower extremity edema, orthopnea, PND, dizziness, near syncope or syncope.   Primary Care Physician: Binnie Rail, MD  Past Medical History:  Diagnosis Date  . AAA (abdominal aortic aneurysm) ([place])   . Asthma 1976  . Bronchiolitis May 2016  . CAD (coronary artery disease)    NSTEMI 02/2010;  s/p CABG 1/12 (L-LAD, SOM1, S-PDA); echo in 08/2010: Mild LVH, EF 62%, grade 2 diastolic dysfunction, mild MR, mild LAE, mildly reduced RV function, mild RAE, PASP 42  . Colon polyps   . COPD (chronic obstructive pulmonary disease) ([place])   . Diabetes mellitus 2000   adult onset  . ED (erectile dysfunction)   . Gout   . HTN (hypertension)   . Hyperlipidemia   . Macular degeneration   . Myocardial infarction ([place]) 1 -13-2012  . Neuropathy     Past Surgical History:  Procedure Laterality Date  . 3 vessel CABG  03/03/2010  . cataract left eye    . COLONOSCOPY     neg 2009  . COLONOSCOPY  2014   5 mm sessile polyp; AVM  . CORONARY ARTERY BYPASS GRAFT  2012   X3  . EYE SURGERY    . INCISION AND DRAINAGE PERIRECTAL ABSCESS    . INGUINAL HERNIA REPAIR      Current Outpatient Medications  Medication Sig Dispense Refill  . ACCU-CHEK COMPACT STRIPS test strip TEST BLOOD SUGAR ONCE DAILY 100 each 1  . ANORO ELLIPTA 62.5-25 MCG/INH AEPB USE 1 INHALATION DAILY 120 each 5  . Aspirin (ADULT ASPIRIN LOW STRENGTH) 81 MG EC tablet Take 81 mg by mouth daily.      . cetirizine (ZYRTEC)  10 MG tablet Take 10 mg by mouth daily as needed for allergies.     . cholecalciferol (VITAMIN D) 1000 UNITS tablet Take 3,000 Units by mouth daily.     . enalapril (VASOTEC) 10 MG tablet TAKE 1 TABLET TWICE A DAY 180 tablet 0  . fluticasone (FLONASE) 50 MCG/ACT nasal spray Place 2 sprays into both nostrils daily as needed for allergies or rhinitis.     Marland Kitchen gabapentin (NEURONTIN) 100 MG capsule Take 1-2 capsules (100-200 mg total) by mouth at bedtime. 60 capsule 3  . Magnesium Oxide 420 MG TABS Take 420 mg by mouth daily.     . Multiple Vitamins-Minerals (MULTIVITAL PO) Take 1 tablet by mouth daily.     . simvastatin (ZOCOR) 40 MG tablet TAKE 1 TABLET DAILY 90 tablet 3  . temazepam (RESTORIL) 7.5 MG capsule Take 1 capsule (7.5 mg total) by mouth at bedtime as needed. (Patient taking differently: Take 7.5 mg by mouth at bedtime as needed for sleep. ) 90 capsule 1  . TOPROL XL 25 MG 24 hr tablet TAKE ONE-HALF (1/2) TABLET DAILY 45 tablet 1   No current facility-administered medications for this visit.     Allergies  Allergen Reactions  . Meperidine Hcl  Tongue swelling Because of a history of documented adverse serious drug reaction;Medi Alert bracelet  is recommended  . Zolpidem Tartrate Other (See Comments)    Nightmares    Social History   Socioeconomic History  . Marital status: Widowed    Spouse name: Not on file  . Number of children: Not on file  . Years of education: Not on file  . Highest education level: Not on file  Occupational History  . Occupation: RETIRED    Employer: RETIRED  Social Needs  . Financial resource strain: Not on file  . Food insecurity    Worry: Not on file    Inability: Not on file  . Transportation needs    Medical: Not on file    Non-medical: Not on file  Tobacco Use  . Smoking status: Former Smoker    Types: Cigarettes    Quit date: 02/20/2003    Years since quitting: 15.6  . Smokeless tobacco: Never Used  Substance and Sexual Activity  .  Alcohol use: Yes    Alcohol/week: 6.0 standard drinks    Types: 3 Glasses of wine, 3 Shots of liquor per week    Comment:  1-3 drinks a week  . Drug use: No  . Sexual activity: Not on file  Lifestyle  . Physical activity    Days per week: Not on file    Minutes per session: Not on file  . Stress: Not on file  Relationships  . Social Herbalist on phone: Not on file    Gets together: Not on file    Attends religious service: Not on file    Active member of club or organization: Not on file    Attends meetings of clubs or organizations: Not on file    Relationship status: Not on file  . Intimate partner violence    Fear of current or ex partner: Not on file    Emotionally abused: Not on file    Physically abused: Not on file    Forced sexual activity: Not on file  Other Topics Concern  . 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     Family History  Problem Relation Age of Onset  . Heart failure Mother   . Heart disease Mother        CHF  . Heart attack Mother   . Hypertension Mother   . Heart attack Father   . Heart disease Father 101       MI  . Hypertension Father   . Heart attack Sister 35  . Heart disease Sister 73       MI  . Diabetes Sister   . Hyperlipidemia Sister   . Hypertension Sister   . Varicose Veins Sister   . Heart attack Brother 18  . Diabetes Brother   . Heart disease Brother 28       MI  . Cancer Brother   . Hypertension Brother   . Deep vein thrombosis Daughter   . Other Son        varicose veins  . Colon cancer Neg Hx     Review of Systems:  As stated in the HPI and otherwise negative.   BP 130/60   Pulse 66   Ht 5\' 11"  (1.803 m)   Wt 168 lb 12.8 oz (76.6 kg)   SpO2 97%   BMI 23.54 kg/m   Physical Examination:  General: Well developed, well nourished, NAD  HEENT: OP clear, mucus membranes moist  SKIN: warm, dry. No rashes. Neuro: No focal deficits   Musculoskeletal: Muscle strength 5/5 all ext  Psychiatric: Mood and affect normal  Neck: No JVD, no carotid bruits, no thyromegaly, no lymphadenopathy.  Lungs:Clear bilaterally, no wheezes, rhonci, crackles Cardiovascular: Regular rate and rhythm. No murmurs, gallops or rubs. Abdomen:Soft. Bowel sounds present. Non-tender.  Extremities: No lower extremity edema. Pulses are 2 + in the bilateral DP/PT.  Echo March 2017: Left ventricle: The cavity size was normal. There was mild focal   basal hypertrophy of the septum. Systolic function was normal.   The estimated ejection fraction was in the range of 55% to 60%.   Wall motion was normal; there were no regional wall motion   abnormalities. Doppler parameters are consistent with abnormal   left ventricular relaxation (grade 1 diastolic dysfunction).   Doppler parameters are consistent with high ventricular filling   pressure. - Mitral valve: There was mild regurgitation. - Right ventricle: The cavity size was mildly dilated. - Right atrium: The atrium was mildly dilated.  Impressions:  - Normal LV systolic function; grade 1 diastolic dysfunction; mild   MR; mild RAE and RVE; trace TR.  EKG:  EKG is not ordered today. The ekg ordered today demonstrates   Recent Labs: 09/30/2018: ALT 12; BUN 20; Creatinine, Ser 1.51; Hemoglobin 13.6; Magnesium 1.9; Platelets 199.0; Potassium 4.6; Sodium 135   Lipid Panel    Component Value Date/Time   CHOL 130 09/30/2018 0947   TRIG 85.0 09/30/2018 0947   HDL 51.90 09/30/2018 0947   CHOLHDL 2 09/30/2018 0947   VLDL 17.0 09/30/2018 0947   LDLCALC 61 09/30/2018 0947     Wt Readings from Last 3 Encounters:  10/16/18 168 lb 12.8 oz (76.6 kg)  10/15/18 168 lb (76.2 kg)  09/30/18 169 lb (76.7 kg)     Other studies Reviewed: Additional studies/ records that were reviewed today include: . Review of the above records demonstrates:    Assessment and Plan:   1. CAD s/p CABG without angina: He  has no chest pain. Will continue ASA, beta blocker and statin.      2. HTN: BP is controlled. No changes  3. Hyperlipidemia: Lipids are well controlled. Continue statin.   Current medicines are reviewed at length with the patient today.  The patient does not have concerns regarding medicines.  The following changes have been made:  no change  Labs/ tests ordered today include:   No orders of the defined types were placed in this encounter.   Disposition:   FU with me in 12  months  Signed, Lauree Chandler, MD 10/16/2018 9:28 AM    [facility] [address], [place], [place]  [address] Phone: [phone]; Fax: [phone]

## 2018-10-16 ENCOUNTER — Other Ambulatory Visit: Payer: Self-pay

## 2018-10-16 ENCOUNTER — Encounter: Payer: Self-pay | Admitting: Cardiovascular Disease

## 2018-10-16 ENCOUNTER — Ambulatory Visit (INDEPENDENT_AMBULATORY_CARE_PROVIDER_SITE_OTHER): Payer: Medicare Other | Admitting: Cardiovascular Disease

## 2018-10-16 VITALS — BP 130/60 | HR 66 | Ht 71.0 in | Wt 168.8 lb

## 2018-10-16 DIAGNOSIS — E782 Mixed hyperlipidemia: Secondary | ICD-10-CM | POA: Diagnosis not present

## 2018-10-16 DIAGNOSIS — I2510 Atherosclerotic heart disease of native coronary artery without angina pectoris: Secondary | ICD-10-CM | POA: Diagnosis not present

## 2018-10-16 DIAGNOSIS — I10 Essential (primary) hypertension: Secondary | ICD-10-CM | POA: Diagnosis not present

## 2018-10-16 NOTE — Patient Instructions (Signed)
Medication Instructions:  Your physician recommends that you continue on your current medications as directed. Please refer to the Current Medication list given to you today.  If you need a refill on your cardiac medications before your next appointment, please call your pharmacy.   Lab work: None If you have labs (blood work) drawn today and your tests are completely normal, you will receive your results only by: Marland Kitchen MyChart Message (if you have MyChart) OR . A paper copy in the mail If you have any lab test that is abnormal or we need to change your treatment, we will call you to review the results.  Testing/Procedures: None  Follow-Up: At [facility], you and your health needs are our priority.  As part of our continuing mission to provide you with exceptional heart care, we have created designated Provider Care Teams.  These Care Teams include your primary Cardiologist (physician) and Advanced Practice Providers (APPs -  Physician Assistants and Nurse Practitioners) who all work together to provide you with the care you need, when you need it. You will need a follow up appointment in 12 months.  Please call our office 2 months in advance to schedule this appointment.  You may see Lauree Chandler, MD or one of the following Advanced Practice Providers on your designated Care Team:   [place], PA-C Melina Copa, PA-C . Ermalinda Barrios, PA-C  Any Other Special Instructions Will Be Listed Below (If Applicable).

## 2018-11-04 ENCOUNTER — Encounter: Payer: Self-pay | Admitting: Neurology

## 2018-11-06 NOTE — Progress Notes (Signed)
[facility] Clinic Note - Initial Visit   Date: 11/07/18  Brian [name] MRN: [id number] DOB: 13-Dec-1931   Dear Dr. Quay Burow:  Thank you for your kind referral of Brian [name] for consultation of nerve pain of the feet and left hand. Although his history is well known to you, please allow [place] to reiterate it for the purpose of our medical record. The patient was accompanied to the clinic by self.    History of Present Illness: Brian [name] is a 85 y.o. right-handed male with hypertension, hyperlipidemia, diet-controlled diabetes, and CAD presenting for evaluation of nerve pain of the feet and left hand.   Starting on August 14th 2020, he developed sharp pain over the left 3rd-5th toes and over the next few days, he developed left middle finger and the right big toe.  His pain lasts less than a minutes and the resolves.  He only had one episode of left middle finger pain.  He continues to have intermittent pain over the left foot, which occurs about 2-3 times per week.  He was prescribed gabapentin 100mg  at bedtime but he did not want to take the medication after reading the side effects.  He denies weakness of the hands or feet or imbalance.  He walks unassisted and has not had any falls.   He drinks 2oz about 2-3 times per week.  He has been drinking alcohol since the age of 48 and had times where he was drinking more and other times less.    Out-side paper records, electronic medical record, and images have been reviewed where available and summarized as:  Lab Results  Component Value Date   HGBA1C 6.3 09/30/2018   Lab Results  Component Value Date   IOEVOJJK09 381 09/30/2018   Lab Results  Component Value Date   TSH 1.88 09/26/2016     Past Medical History:  Diagnosis Date  . AAA (abdominal aortic aneurysm) ([place])   . Asthma 1976  . Bronchiolitis May 2016  . CAD (coronary artery disease)    NSTEMI 02/2010;  s/p CABG 1/12 (L-LAD, SOM1,  S-PDA); echo in 08/2010: Mild LVH, EF 82%, grade 2 diastolic dysfunction, mild MR, mild LAE, mildly reduced RV function, mild RAE, PASP 42  . Colon polyps   . COPD (chronic obstructive pulmonary disease) ([place])   . Diabetes mellitus 2000   adult onset  . ED (erectile dysfunction)   . Gout   . HTN (hypertension)   . Hyperlipidemia   . Macular degeneration   . Myocardial infarction ([place]) 1 -13-2012  . Neuropathy     Past Surgical History:  Procedure Laterality Date  . 3 vessel CABG  03/03/2010  . cataract left eye    . COLONOSCOPY     neg 2009  . COLONOSCOPY  2014   5 mm sessile polyp; AVM  . CORONARY ARTERY BYPASS GRAFT  2012   X3  . EYE SURGERY    . INCISION AND DRAINAGE PERIRECTAL ABSCESS    . INGUINAL HERNIA REPAIR       Medications:  Outpatient Encounter Medications as of 11/07/2018  Medication Sig  . ACCU-CHEK COMPACT STRIPS test strip TEST BLOOD SUGAR ONCE DAILY  . ANORO ELLIPTA 62.5-25 MCG/INH AEPB USE 1 INHALATION DAILY  . Aspirin (ADULT ASPIRIN LOW STRENGTH) 81 MG EC tablet Take 81 mg by mouth daily.    . cetirizine (ZYRTEC) 10 MG tablet Take 10 mg by mouth daily as needed for allergies.   Marland Kitchen  cholecalciferol (VITAMIN D) 1000 UNITS tablet Take 3,000 Units by mouth daily.   . enalapril (VASOTEC) 10 MG tablet TAKE 1 TABLET TWICE A DAY  . fluticasone (FLONASE) 50 MCG/ACT nasal spray Place 2 sprays into both nostrils daily as needed for allergies or rhinitis.   Marland Kitchen gabapentin (NEURONTIN) 100 MG capsule Take 1-2 capsules (100-200 mg total) by mouth at bedtime.  . Magnesium Oxide 420 MG TABS Take 420 mg by mouth daily.   . Multiple Vitamins-Minerals (MULTIVITAL PO) Take 1 tablet by mouth daily.   . simvastatin (ZOCOR) 40 MG tablet TAKE 1 TABLET DAILY  . temazepam (RESTORIL) 7.5 MG capsule Take 1 capsule (7.5 mg total) by mouth at bedtime as needed. (Patient taking differently: Take 7.5 mg by mouth at bedtime as needed for sleep. )  . TOPROL XL 25 MG 24 hr tablet TAKE ONE-HALF  (1/2) TABLET DAILY   No facility-administered encounter medications on file as of 11/07/2018.     Allergies:  Allergies  Allergen Reactions  . Meperidine Hcl     Tongue swelling Because of a history of documented adverse serious drug reaction;Medi Alert bracelet  is recommended  . Zolpidem Tartrate Other (See Comments)    Nightmares    Family History: Family History  Problem Relation Age of Onset  . Heart failure Mother   . Heart disease Mother        CHF  . Heart attack Mother   . Hypertension Mother   . Heart attack Father   . Heart disease Father 80       MI  . Hypertension Father   . Heart attack Sister 37  . Heart disease Sister 85       MI  . Diabetes Sister   . Hyperlipidemia Sister   . Hypertension Sister   . Varicose Veins Sister   . Heart attack Brother 13  . Diabetes Brother   . Heart disease Brother 27       MI  . Cancer Brother   . Hypertension Brother   . Deep vein thrombosis Daughter   . Other Son        varicose veins  . Colon cancer Neg Hx     Social History: Social History   Tobacco Use  . Smoking status: Former Smoker    Types: Cigarettes    Quit date: 02/20/2003    Years since quitting: 15.7  . Smokeless tobacco: Never Used  Substance Use Topics  . Alcohol use: Yes    Alcohol/week: 6.0 standard drinks    Types: 3 Glasses of wine, 3 Shots of liquor per week    Comment:  1-3 drinks a week  . Drug use: No   Social History   Social History Narrative   Quit smoking in 1998. 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    One story home   Right handed   3 children    Review of Systems:  CONSTITUTIONAL: No fevers, chills, night sweats, or weight loss.   EYES: No visual changes or eye pain ENT: No hearing changes.  No history of nose bleeds.   RESPIRATORY: No cough, wheezing and shortness of breath.   CARDIOVASCULAR: Negative for chest pain, and palpitations.   GI: Negative for abdominal discomfort, blood in stools or black stools.  No  recent change in bowel habits.   GU:  No history of incontinence.   MUSCLOSKELETAL: No history of joint pain or swelling.  No myalgias.   SKIN: Negative for lesions, rash,  and itching.   HEMATOLOGY/ONCOLOGY: Negative for prolonged bleeding, bruising easily, and swollen nodes.  No history of cancer.   ENDOCRINE: Negative for cold or heat intolerance, polydipsia or goiter.   PSYCH:  No depression or anxiety symptoms.   NEURO: As Above.   Vital Signs:  BP 122/64   Pulse 84   Ht 5\' 11"  (1.803 m)   Wt 168 lb (76.2 kg)   SpO2 95%   BMI 23.43 kg/m    General Medical Exam:   General:  Well appearing, comfortable.   Eyes/ENT: see cranial nerve examination.   Neck:   No carotid bruits. Respiratory:  Clear to auscultation, good air entry bilaterally.   Cardiac:  Regular rate and rhythm, no murmur.   Extremities:  No deformities, edema, or skin discoloration.  Skin:  No rashes or lesions.  Neurological Exam: MENTAL STATUS including orientation to time, place, person, recent and remote memory, attention span and concentration, language, and fund of knowledge is normal.  Speech is not dysarthric.  CRANIAL NERVES: II:  No visual field defects.  Unremarkable fundi.   III-IV-VI: Pupils equal round and reactive to light.  Normal conjugate, extra-ocular eye movements in all directions of gaze.  No nystagmus.  Trace left ptosis (old) V:  Normal facial sensation.    VII:  Normal facial symmetry and movements.   VIII:  Normal hearing and vestibular function.   IX-X:  Normal palatal movement.   XI:  Normal shoulder shrug and head rotation.   XII:  Normal tongue strength and range of motion, no deviation or fasciculation.  MOTOR:  No atrophy, fasciculations or abnormal movements.  No pronator drift.   Upper Extremity:  Right  Left  Deltoid  5/5   5/5   Biceps  5/5   5/5   Triceps  5/5   5/5   Infraspinatus 5/5  5/5  Medial pectoralis 5/5  5/5  Wrist extensors  5/5   5/5   Wrist flexors  5/5    5/5   Finger extensors  5/5   5/5   Finger flexors  5/5   5/5   Dorsal interossei  5-/5   5-/5   Abductor pollicis  5/5   5/5   Tone (Ashworth scale)  0  0   Lower Extremity:  Right  Left  Hip flexors  5/5   5/5   Hip extensors  5/5   5/5   Adductor 5/5  5/5  Abductor 5/5  5/5  Knee flexors  5/5   5/5   Knee extensors  5/5   5/5   Dorsiflexors  5/5   5/5   Plantarflexors  5/5   5/5   Toe extensors  5/5   5/5   Toe flexors  5/5   5/5   Tone (Ashworth scale)  0  0   MSRs:  Right        Left                  brachioradialis 2+  2+  biceps 2+  2+  triceps 2+  2+  patellar 2+  2+  ankle jerk tr  tr  Hoffman no  no  plantar response down  down   SENSORY: Vibration is reduced at the great toe bilaterally, intact above the ankles.  Temperature and pinprick is intact.  Romberg sign is absent.    COORDINATION/GAIT: Normal finger-to- nose-finger and heel-to-shin.  Intact rapid alternating movements bilaterally.  Gait narrow-based and stable.  IMPRESSION: 1. Bilateral feet paresthesias, probable neuropathy.  Exam shows mild sensory loss to vibration only and arreflexia at the ankles.  2. Left hand paresthesias, infrequent, possible carpal tunnel syndrome   PLAN/RECOMMENDATIONS:  1.  Check MMA, vitamin B1, folate, and copper 2.  NCS/EMG of the left arm and leg 3.  Start gabapentin 100mg  at bedtime x 1 week, then increase to 200mg  at bedtime.  Side effects discussed  Further recommendations pending results.  Thank you for allowing me to participate in patient's care.  If I can answer any additional questions, I would be pleased to do so.    Sincerely,    Irving Lubbers K. Posey Pronto, DO

## 2018-11-07 ENCOUNTER — Encounter: Payer: Self-pay | Admitting: Neurology

## 2018-11-07 ENCOUNTER — Ambulatory Visit (INDEPENDENT_AMBULATORY_CARE_PROVIDER_SITE_OTHER): Payer: Medicare Other | Admitting: Neurology

## 2018-11-07 ENCOUNTER — Other Ambulatory Visit (INDEPENDENT_AMBULATORY_CARE_PROVIDER_SITE_OTHER): Payer: Medicare Other

## 2018-11-07 ENCOUNTER — Other Ambulatory Visit: Payer: Self-pay

## 2018-11-07 VITALS — BP 122/64 | HR 84 | Ht 71.0 in | Wt 168.0 lb

## 2018-11-07 DIAGNOSIS — R202 Paresthesia of skin: Secondary | ICD-10-CM | POA: Diagnosis not present

## 2018-11-07 NOTE — Patient Instructions (Signed)
Start gabapentin 100mg  at bedtime for one week, then increase to 2 tablet at bedtime.  Side effects include sleepiness or grogginess.  Check labs  Nerve testing of the left arm and leg.  Do not apply lotion, oil, or moisturizer to your hands or feet.    ELECTROMYOGRAM AND NERVE CONDUCTION STUDIES (EMG/NCS) INSTRUCTIONS  How to Prepare The neurologist conducting the EMG will need to know if you have certain medical conditions. Tell the neurologist and other EMG lab personnel if you: . Have a pacemaker or any other electrical medical device . Take blood-thinning medications . Have hemophilia, a blood-clotting disorder that causes prolonged bleeding Bathing Take a shower or bath shortly before your exam in order to remove oils from your skin. Don't apply lotions or creams before the exam.  What to Expect You'll likely be asked to change into a hospital gown for the procedure and lie down on an examination table. The following explanations can help you understand what will happen during the exam.  . Electrodes. The neurologist or a technician places surface electrodes at various locations on your skin depending on where you're experiencing symptoms. Or the neurologist may insert needle electrodes at different sites depending on your symptoms.  . Sensations. The electrodes will at times transmit a tiny electrical current that you may feel as a twinge or spasm. The needle electrode may cause discomfort or pain that usually ends shortly after the needle is removed. If you are concerned about discomfort or pain, you may want to talk to the neurologist about taking a short break during the exam.  . Instructions. During the needle EMG, the neurologist will assess whether there is any spontaneous electrical activity when the muscle is at rest - activity that isn't present in healthy muscle tissue - and the degree of activity when you slightly contract the muscle.  He or she will give you instructions on  resting and contracting a muscle at appropriate times. Depending on what muscles and nerves the neurologist is examining, he or she may ask you to change positions during the exam.  After your EMG You may experience some temporary, minor bruising where the needle electrode was inserted into your muscle. This bruising should fade within several days. If it persists, contact your primary care doctor.

## 2018-11-12 LAB — COPPER, SERUM: Copper: 115 ug/dL (ref 70–175)

## 2018-11-12 LAB — VITAMIN B1: Vitamin B1 (Thiamine): 18 nmol/L (ref 8–30)

## 2018-11-12 LAB — FOLATE: Folate: >24 ng/mL

## 2018-11-12 LAB — METHYLMALONIC ACID, SERUM: Methylmalonic Acid, Quant: 246 nmol/L (ref 87–318)

## 2018-11-14 ENCOUNTER — Ambulatory Visit: Payer: Medicare Other | Admitting: Vascular Surgery

## 2018-11-14 ENCOUNTER — Other Ambulatory Visit (HOSPITAL_COMMUNITY): Payer: Medicare Other

## 2018-11-18 ENCOUNTER — Other Ambulatory Visit: Payer: Self-pay

## 2018-11-18 DIAGNOSIS — I714 Abdominal aortic aneurysm, without rupture, unspecified: Secondary | ICD-10-CM

## 2018-11-20 ENCOUNTER — Telehealth (HOSPITAL_COMMUNITY): Payer: Self-pay | Admitting: *Deleted

## 2018-11-20 NOTE — Telephone Encounter (Signed)
The above patient or their representative was contacted and gave the following answers to these questions:         Do you have any of the following symptoms?n  Fever                    Cough                   Shortness of breath  Do  you have any of the following other symptoms? n   muscle pain         vomiting,        diarrhea        rash         weakness        red eye        abdominal pain         bruising          bruising or bleeding              joint pain           severe headache    Have you been in contact with someone who was or has been sick in the past 2 weeks?n  Yes                 Unsure                         Unable to assess   Does the person that you were in contact with have any of the following symptoms?   Cough         shortness of breath           muscle pain         vomiting,            diarrhea            rash            weakness           fever            red eye           abdominal pain           bruising  or  bleeding                joint pain                severe headache               Have you  or someone you have been in contact with traveled internationally in th last month?         If yes, which countries?   Have you  or someone you have been in contact with traveled outside [place] in th last month?         If yes, which state and city?   COMMENTS OR ACTION PLAN FOR THIS PATIENT:         [facility]

## 2018-11-21 ENCOUNTER — Other Ambulatory Visit: Payer: Self-pay

## 2018-11-21 ENCOUNTER — Ambulatory Visit (HOSPITAL_COMMUNITY)
Admission: RE | Admit: 2018-11-21 | Discharge: 2018-11-21 | Disposition: A | Discharge status: Home / Self Care | Payer: Medicare Other | Source: Ambulatory Visit | Attending: Vascular Surgery | Admitting: Vascular Surgery

## 2018-11-21 ENCOUNTER — Encounter: Payer: Self-pay | Admitting: Vascular Surgery

## 2018-11-21 ENCOUNTER — Ambulatory Visit (INDEPENDENT_AMBULATORY_CARE_PROVIDER_SITE_OTHER): Payer: Medicare Other | Admitting: Vascular Surgery

## 2018-11-21 VITALS — BP 135/70 | HR 68 | Temp 97.3°F | Resp 20 | Ht 71.0 in | Wt 166.9 lb

## 2018-11-21 DIAGNOSIS — I714 Abdominal aortic aneurysm, without rupture, unspecified: Secondary | ICD-10-CM

## 2018-11-21 NOTE — Progress Notes (Signed)
Patient ID: Brian [name], male   DOB: 1931/08/22, 85 y.o.   MRN: [id number]  Reason for Consult: Follow-up   Referred by Binnie Rail, MD  Subjective:     HPI:  ANEESH FALLER is a 85 y.o. male follows up for known abdominal aortic aneurysm.  No new back or abdominal complaints.  Patient is very active.  Does denies any complaints today.  Is followed by Dr. Angelena Form with cardiology.  Past Medical History:  Diagnosis Date  . AAA (abdominal aortic aneurysm) ([place])   . Asthma 1976  . Bronchiolitis May 2016  . CAD (coronary artery disease)    NSTEMI 02/2010;  s/p CABG 1/12 (L-LAD, SOM1, S-PDA); echo in 08/2010: Mild LVH, EF 95%, grade 2 diastolic dysfunction, mild MR, mild LAE, mildly reduced RV function, mild RAE, PASP 42  . Colon polyps   . COPD (chronic obstructive pulmonary disease) ([place])   . Diabetes mellitus 2000   adult onset  . ED (erectile dysfunction)   . Gout   . HTN (hypertension)   . Hyperlipidemia   . Macular degeneration   . Myocardial infarction ([place]) 1 -13-2012  . Neuropathy    Family History  Problem Relation Age of Onset  . Heart failure Mother   . Heart disease Mother        CHF  . Heart attack Mother   . Hypertension Mother   . Heart attack Father   . Heart disease Father 22       MI  . Hypertension Father   . Heart attack Sister 42  . Heart disease Sister 3       MI  . Diabetes Sister   . Hyperlipidemia Sister   . Hypertension Sister   . Varicose Veins Sister   . Heart attack Brother 47  . Diabetes Brother   . Heart disease Brother 40       MI  . Cancer Brother   . Hypertension Brother   . Deep vein thrombosis Daughter   . Other Son        varicose veins  . Colon cancer Neg Hx    Past Surgical History:  Procedure Laterality Date  . 3 vessel CABG  03/03/2010  . cataract left eye    . COLONOSCOPY     neg 2009  . COLONOSCOPY  2014   5 mm sessile polyp; AVM  . CORONARY ARTERY BYPASS GRAFT  2012   X3  . EYE SURGERY    .  INCISION AND DRAINAGE PERIRECTAL ABSCESS    . INGUINAL HERNIA REPAIR      Short Social History:  Social History   Tobacco Use  . Smoking status: Former Smoker    Types: Cigarettes    Quit date: 02/20/2003    Years since quitting: 15.7  . Smokeless tobacco: Never Used  Substance Use Topics  . Alcohol use: Yes    Alcohol/week: 6.0 standard drinks    Types: 3 Glasses of wine, 3 Shots of liquor per week    Comment:  1-3 drinks a week    Allergies  Allergen Reactions  . Meperidine Hcl     Tongue swelling Because of a history of documented adverse serious drug reaction;Medi Alert bracelet  is recommended  . Zolpidem Tartrate Other (See Comments)    Nightmares    Current Outpatient Medications  Medication Sig Dispense Refill  . ACCU-CHEK COMPACT STRIPS test strip TEST BLOOD SUGAR ONCE DAILY 100 each 1  . ANORO ELLIPTA  62.5-25 MCG/INH AEPB USE 1 INHALATION DAILY 120 each 5  . Aspirin (ADULT ASPIRIN LOW STRENGTH) 81 MG EC tablet Take 81 mg by mouth daily.      . cetirizine (ZYRTEC) 10 MG tablet Take 10 mg by mouth daily as needed for allergies.     . cholecalciferol (VITAMIN D) 1000 UNITS tablet Take 3,000 Units by mouth daily.     . enalapril (VASOTEC) 10 MG tablet TAKE 1 TABLET TWICE A DAY 180 tablet 0  . fluticasone (FLONASE) 50 MCG/ACT nasal spray Place 2 sprays into both nostrils daily as needed for allergies or rhinitis.     Marland Kitchen gabapentin (NEURONTIN) 100 MG capsule Take 1-2 capsules (100-200 mg total) by mouth at bedtime. 60 capsule 3  . Magnesium Oxide 420 MG TABS Take 420 mg by mouth daily.     . Multiple Vitamins-Minerals (MULTIVITAL PO) Take 1 tablet by mouth daily.     . simvastatin (ZOCOR) 40 MG tablet TAKE 1 TABLET DAILY 90 tablet 3  . temazepam (RESTORIL) 7.5 MG capsule Take 1 capsule (7.5 mg total) by mouth at bedtime as needed. (Patient taking differently: Take 7.5 mg by mouth at bedtime as needed for sleep. ) 90 capsule 1  . TOPROL XL 25 MG 24 hr tablet TAKE ONE-HALF  (1/2) TABLET DAILY 45 tablet 1   No current facility-administered medications for this visit.     Review of Systems  Constitutional:  Constitutional negative. HENT: HENT negative.  Eyes: Eyes negative.  Respiratory: Respiratory negative.  Cardiovascular: Cardiovascular negative.  GI: Gastrointestinal negative.  Musculoskeletal: Musculoskeletal negative.  Skin: Skin negative.  Neurological: Neurological negative. Hematologic: Hematologic/lymphatic negative.  Psychiatric: Psychiatric negative.        Objective:  Objective   Vitals:   11/21/18 0847  BP: 135/70  Pulse: 68  Resp: 20  Temp: (!) 97.3 F (36.3 C)  SpO2: 98%  Weight: 166 lb 14.4 oz (75.7 kg)  Height: 5\' 11"  (1.803 m)   Body mass index is 23.28 kg/m.  Physical Exam Constitutional:      Appearance: Normal appearance.  HENT:     Head: Normocephalic.     Nose: Nose normal.     Mouth/Throat:     Mouth: Mucous membranes are moist.  Eyes:     Pupils: Pupils are equal, round, and reactive to light.  Neck:     Musculoskeletal: Normal range of motion and neck supple.  Cardiovascular:     Rate and Rhythm: Normal rate.     Pulses:          Femoral pulses are 2+ on the right side and 2+ on the left side.      Popliteal pulses are 2+ on the right side and 2+ on the left side.  Pulmonary:     Breath sounds: Normal breath sounds.  Abdominal:     General: Abdomen is flat.     Palpations: Abdomen is soft.  Musculoskeletal: Normal range of motion.  Skin:    General: Skin is warm.     Capillary Refill: Capillary refill takes less than 2 seconds.  Neurological:     General: No focal deficit present.     Mental Status: He is alert.  Psychiatric:        Mood and Affect: Mood normal.        Behavior: Behavior normal.        Thought Content: Thought content normal.        Judgment: Judgment normal.  Data: I have independently interpreted his abdominal aortic duplex which demonstrates distal aneurysm size  5.3 cm which is increased from previous 5 x 4.4.     Assessment/Plan:     85 year old male follows up for abdominal aortic aneurysm evaluation.  Aneurysm has enlarged in size to 5.3 cm.  At this time we will obtain CT scan chest abdomen pelvis for further evaluation.  I will see him after this.  Is followed by Dr. Angelena Form with cardiology would need clearance prior to any consideration of surgery.     Waynetta Sandy MD [facility]

## 2018-11-24 DIAGNOSIS — E119 Type 2 diabetes mellitus without complications: Secondary | ICD-10-CM | POA: Diagnosis not present

## 2018-11-24 DIAGNOSIS — H11003 Unspecified pterygium of eye, bilateral: Secondary | ICD-10-CM | POA: Diagnosis not present

## 2018-11-24 DIAGNOSIS — H43813 Vitreous degeneration, bilateral: Secondary | ICD-10-CM | POA: Diagnosis not present

## 2018-11-24 DIAGNOSIS — H52203 Unspecified astigmatism, bilateral: Secondary | ICD-10-CM | POA: Diagnosis not present

## 2018-11-24 LAB — HM DIABETES EYE EXAM

## 2018-11-25 ENCOUNTER — Encounter: Payer: Self-pay | Admitting: Internal Medicine

## 2018-11-27 ENCOUNTER — Other Ambulatory Visit: Payer: Self-pay | Admitting: Vascular Surgery

## 2018-11-27 DIAGNOSIS — I714 Abdominal aortic aneurysm, without rupture, unspecified: Secondary | ICD-10-CM

## 2018-12-09 ENCOUNTER — Other Ambulatory Visit: Payer: Self-pay | Admitting: Internal Medicine

## 2018-12-15 ENCOUNTER — Ambulatory Visit: Payer: Medicare Other | Admitting: Neurology

## 2018-12-16 ENCOUNTER — Other Ambulatory Visit: Payer: Self-pay

## 2018-12-16 ENCOUNTER — Ambulatory Visit (INDEPENDENT_AMBULATORY_CARE_PROVIDER_SITE_OTHER): Payer: Medicare Other | Admitting: Neurology

## 2018-12-16 DIAGNOSIS — G5602 Carpal tunnel syndrome, left upper limb: Secondary | ICD-10-CM | POA: Diagnosis not present

## 2018-12-16 DIAGNOSIS — R202 Paresthesia of skin: Secondary | ICD-10-CM

## 2018-12-16 NOTE — Procedures (Addendum)
[facility]  [address]  [place], [place] [address] Tel: [phone] Fax:  [phone] Test Date:  12/16/2018  Patient: Brian [name] DOB: October 12, 1931 Physician: Narda Amber, DO  Sex: Male Height: 5\' 11"  Ref Phys: Narda Amber, DO  ID#: [id number] Temp: 32.0C Technician:    Patient Complaints: This is an 85 year old man referred for evaluation of episodic left finger and left foot pain and numbness.  NCV & EMG Findings: Extensive electrodiagnostic testing of the left upper and lower extremity shows:  1. Left median sensory response shows prolonged distal peak latency (4.5 ms) and reduced amplitude (9.8 V).  Left ulnar sensory response shows prolonged distal peak latency (3.6 ms).   2. Left sural sensory responses within normal limits.   3. Left median motor response shows prolonged distal onset latency (4.5 ms).  Left ulnar motor response shows prolonged latency (3.3 ms) and decreased conduction velocity across the elbow (A Elbow-B Elbow, 48 m/s).  4. Chronic motor axonal loss changes are seen affecting the left abductor pollicis brevis muscle, without accompanied active denervation.  The remaining tested muscles show normal motor unit recruitment and configuration.    Impression: 1. Left median neuropathy at or distal to the wrist (moderate), consistent with a clinical diagnosis of carpal tunnel syndrome.   2. Left ulnar neuropathy with slowing across the elbow, purely demyelinating in type, mild. 3. There is no evidence of a sensorimotor polyneuropathy or lumbosacral radiculopathy affecting the left lower extremity.   ___________________________ Narda Amber, DO    Nerve Conduction Studies Anti Sensory Summary Table   Site NR Peak (ms) Norm Peak (ms) P-T Amp (V) Norm P-T Amp  Left Median Anti Sensory (2nd Digit)  32C  Wrist    4.5 <3.8 9.8 >10  Left Sural Anti Sensory (Lat Mall)  32C  Calf    3.9 <4.6 5.6 >3  Left Ulnar Anti Sensory (5th  Digit)  32C  Wrist    3.6 <3.2 15.0 >5   Motor Summary Table   Site NR Onset (ms) Norm Onset (ms) O-P Amp (mV) Norm O-P Amp Site1 Site2 Delta-0 (ms) Dist (cm) Vel (m/s) Norm Vel (m/s)  Left Median Motor (Abd Poll Brev)  32C  Wrist    4.5 <4.0 5.4 >5 Elbow Wrist 6.1 33.0 54 >50  Elbow    10.6  5.0         Left Peroneal Motor (Ext Dig Brev)  32C  Ankle    3.5 <6.0 2.6 >2.5 B Fib Ankle 8.1 39.0 48 >40  B Fib    11.6  2.4  Poplt B Fib 2.2 10.0 45 >40  Poplt    13.8  2.1         Left Tibial Motor (Abd Hall Brev)  32C  Ankle    2.7 <6.0 5.2 >4 Knee Ankle 9.6 41.0 43 >40  Knee    12.3  3.0         Left Ulnar Motor (Abd Dig Minimi)  32C  Wrist    3.3 <3.1 10.2 >7 B Elbow Wrist 5.1 27.0 53 >50  B Elbow    8.4  9.4  A Elbow B Elbow 2.1 10.0 48 >50  A Elbow    10.5  8.8          H Reflex Studies   NR H-Lat (ms) Lat Norm (ms) L-R H-Lat (ms)  Left Tibial (Gastroc)  32C     33.61 <35    EMG  Side Muscle Ins Act Fibs Psw Fasc Number Recrt Dur Dur. Amp Amp. Poly Poly. Comment  Left AntTibialis Nml Nml Nml Nml Nml Nml Nml Nml Nml Nml Nml Nml N/A  Left Gastroc Nml Nml Nml Nml Nml Nml Nml Nml Nml Nml Nml Nml N/A  Left Flex Dig Long Nml Nml Nml Nml Nml Nml Nml Nml Nml Nml Nml Nml N/A  Left RectFemoris Nml Nml Nml Nml Nml Nml Nml Nml Nml Nml Nml Nml N/A  Left GluteusMed Nml Nml Nml Nml Nml Nml Nml Nml Nml Nml Nml Nml N/A  Left 1stDorInt Nml Nml Nml Nml Nml Nml Nml Nml Nml Nml Nml Nml N/A  Left Abd Poll Brev Nml Nml Nml Nml 1- Rapid Some 1+ Some 1+ Nml Nml N/A  Left PronatorTeres Nml Nml Nml Nml Nml Nml Nml Nml Nml Nml Nml Nml N/A  Left Biceps Nml Nml Nml Nml Nml Nml Nml Nml Nml Nml Nml Nml N/A  Left Triceps Nml Nml Nml Nml Nml Nml Nml Nml Nml Nml Nml Nml N/A  Left Deltoid Nml Nml Nml Nml Nml Nml Nml Nml Nml Nml Nml Nml N/A  Left FlexCarpiUln Nml Nml Nml Nml Nml Nml Nml Nml Nml Nml Nml Nml N/A      Waveforms:

## 2018-12-17 ENCOUNTER — Telehealth: Payer: Self-pay | Admitting: Neurology

## 2018-12-17 NOTE — Telephone Encounter (Signed)
Results of EMG discussed with patient which showed moderate left CTS and mild ulnar neuropathy at the elbow, no signs of neuropathy.  He denies any ongoing numbness/tingling of the hand or foot.  I have instructed him to start using a wrist splint at night time, if the hand tingling returns and avoid over flexion at the wrist and elbow.    Vitoria Conyer K. Posey Pronto, DO

## 2018-12-29 ENCOUNTER — Ambulatory Visit
Admission: RE | Admit: 2018-12-29 | Discharge: 2018-12-29 | Disposition: A | Discharge status: Home / Self Care | Payer: Medicare Other | Source: Ambulatory Visit | Attending: Vascular Surgery | Admitting: Vascular Surgery

## 2018-12-29 ENCOUNTER — Telehealth: Payer: Self-pay | Admitting: *Deleted

## 2018-12-29 DIAGNOSIS — I714 Abdominal aortic aneurysm, without rupture, unspecified: Secondary | ICD-10-CM

## 2018-12-29 MED ORDER — IOPAMIDOL (ISOVUE-370) INJECTION 76%
75.0000 mL | Freq: Once | INTRAVENOUS | Status: AC | PRN
  Administered 2018-12-29: 11:00:00 75 mL via INTRAVENOUS

## 2018-12-29 NOTE — Telephone Encounter (Signed)
Text page to Dr. Donzetta Matters after called CTA report from [place] at [facility] imaging.

## 2019-01-02 ENCOUNTER — Encounter: Payer: Self-pay | Admitting: Vascular Surgery

## 2019-01-02 ENCOUNTER — Other Ambulatory Visit: Payer: Self-pay

## 2019-01-02 ENCOUNTER — Telehealth: Payer: Self-pay

## 2019-01-02 ENCOUNTER — Ambulatory Visit (INDEPENDENT_AMBULATORY_CARE_PROVIDER_SITE_OTHER): Payer: Medicare Other | Admitting: Vascular Surgery

## 2019-01-02 VITALS — BP 144/71 | HR 60 | Temp 97.4°F | Resp 20 | Ht 71.0 in | Wt 168.8 lb

## 2019-01-02 DIAGNOSIS — I714 Abdominal aortic aneurysm, without rupture, unspecified: Secondary | ICD-10-CM

## 2019-01-02 NOTE — Telephone Encounter (Signed)
I would not recommend ischemic testing. He can proceed with his planned procedure since he is doing well clinically.   Lauree Chandler

## 2019-01-02 NOTE — Telephone Encounter (Signed)
Patient is scheduled for an EVAR with Dr Donzetta Matters on 01/21/19. We need to obtain cardiac clearance from Dr Julianne Handler for the procedure.   Thank you.  Brian [name] [phone] [facility]

## 2019-01-02 NOTE — Progress Notes (Signed)
Patient ID: Brian [name], male   DOB: February 25, 1931, 85 y.o.   MRN: [id number]  Reason for Consult: Follow-up   Referred by Binnie Rail, MD  Subjective:     HPI:  Brian [name] is a 85 y.o. male follows up with CT scan to evaluate his aorta.  Not having any new back or abdominal pain.  Remains very active.  Followed by Dr. Angelena Form with cardiology.  Is never had any abdominal surgery.  Past Medical History:  Diagnosis Date  . AAA (abdominal aortic aneurysm) ([place])   . Asthma 1976  . Bronchiolitis May 2016  . CAD (coronary artery disease)    NSTEMI 02/2010;  s/p CABG 1/12 (L-LAD, SOM1, S-PDA); echo in 08/2010: Mild LVH, EF 81%, grade 2 diastolic dysfunction, mild MR, mild LAE, mildly reduced RV function, mild RAE, PASP 42  . Colon polyps   . COPD (chronic obstructive pulmonary disease) ([place])   . Diabetes mellitus 2000   adult onset  . ED (erectile dysfunction)   . Gout   . HTN (hypertension)   . Hyperlipidemia   . Macular degeneration   . Myocardial infarction ([place]) 1 -13-2012  . Neuropathy    Family History  Problem Relation Age of Onset  . Heart failure Mother   . Heart disease Mother        CHF  . Heart attack Mother   . Hypertension Mother   . Heart attack Father   . Heart disease Father 11       MI  . Hypertension Father   . Heart attack Sister 22  . Heart disease Sister 92       MI  . Diabetes Sister   . Hyperlipidemia Sister   . Hypertension Sister   . Varicose Veins Sister   . Heart attack Brother 85  . Diabetes Brother   . Heart disease Brother 17       MI  . Cancer Brother   . Hypertension Brother   . Deep vein thrombosis Daughter   . Other Son        varicose veins  . Colon cancer Neg Hx    Past Surgical History:  Procedure Laterality Date  . 3 vessel CABG  03/03/2010  . cataract left eye    . COLONOSCOPY     neg 2009  . COLONOSCOPY  2014   5 mm sessile polyp; AVM  . CORONARY ARTERY BYPASS GRAFT  2012   X3  . EYE SURGERY    .  INCISION AND DRAINAGE PERIRECTAL ABSCESS    . INGUINAL HERNIA REPAIR      Short Social History:  Social History   Tobacco Use  . Smoking status: Former Smoker    Types: Cigarettes    Quit date: 02/20/2003    Years since quitting: 15.8  . Smokeless tobacco: Never Used  Substance Use Topics  . Alcohol use: Yes    Alcohol/week: 6.0 standard drinks    Types: 3 Glasses of wine, 3 Shots of liquor per week    Comment:  1-3 drinks a week    Allergies  Allergen Reactions  . Meperidine Hcl     Tongue swelling Because of a history of documented adverse serious drug reaction;Medi Alert bracelet  is recommended  . Zolpidem Tartrate Other (See Comments)    Nightmares    Current Outpatient Medications  Medication Sig Dispense Refill  . ACCU-CHEK COMPACT STRIPS test strip TEST BLOOD SUGAR ONCE DAILY 100 each 1  .  ANORO ELLIPTA 62.5-25 MCG/INH AEPB USE 1 INHALATION DAILY 120 each 5  . Aspirin (ADULT ASPIRIN LOW STRENGTH) 81 MG EC tablet Take 81 mg by mouth daily.      . cetirizine (ZYRTEC) 10 MG tablet Take 10 mg by mouth daily as needed for allergies.     . cholecalciferol (VITAMIN D) 1000 UNITS tablet Take 3,000 Units by mouth daily.     . enalapril (VASOTEC) 10 MG tablet TAKE 1 TABLET TWICE A DAY 180 tablet 1  . fluticasone (FLONASE) 50 MCG/ACT nasal spray Place 2 sprays into both nostrils daily as needed for allergies or rhinitis.     Marland Kitchen gabapentin (NEURONTIN) 100 MG capsule Take 1-2 capsules (100-200 mg total) by mouth at bedtime. 60 capsule 3  . Magnesium Oxide 420 MG TABS Take 420 mg by mouth daily.     . Multiple Vitamins-Minerals (MULTIVITAL PO) Take 1 tablet by mouth daily.     . simvastatin (ZOCOR) 40 MG tablet TAKE 1 TABLET DAILY 90 tablet 3  . temazepam (RESTORIL) 7.5 MG capsule Take 1 capsule (7.5 mg total) by mouth at bedtime as needed. (Patient taking differently: Take 7.5 mg by mouth at bedtime as needed for sleep. ) 90 capsule 1  . TOPROL XL 25 MG 24 hr tablet TAKE ONE-HALF  (1/2) TABLET DAILY 45 tablet 1   No current facility-administered medications for this visit.     Review of Systems  Constitutional:  Constitutional negative. HENT: HENT negative.  Eyes: Eyes negative.  Respiratory: Respiratory negative.  Cardiovascular: Cardiovascular negative.  GI: Gastrointestinal negative.  Musculoskeletal: Musculoskeletal negative.  Skin: Skin negative.  Neurological: Neurological negative. Hematologic: Hematologic/lymphatic negative.  Psychiatric: Psychiatric negative.        Objective:  Objective   Vitals:   01/02/19 0927  BP: (!) 144/71  Pulse: 60  Resp: 20  Temp: (!) 97.4 F (36.3 C)  SpO2: 94%  Weight: 168 lb 12.8 oz (76.6 kg)  Height: 5\' 11"  (1.803 m)   Body mass index is 23.54 kg/m.  Physical Exam Constitutional:      Appearance: Normal appearance.  HENT:     Head: Normocephalic.     Nose: Nose normal.  Eyes:     Pupils: Pupils are equal, round, and reactive to light.  Cardiovascular:     Rate and Rhythm: Normal rate.  Pulmonary:     Effort: Pulmonary effort is normal.  Abdominal:     General: Abdomen is flat.     Palpations: Abdomen is soft.  Musculoskeletal: Normal range of motion.        General: No swelling.  Skin:    General: Skin is warm and dry.     Capillary Refill: Capillary refill takes less than 2 seconds.  Neurological:     General: No focal deficit present.     Mental Status: He is alert.  Psychiatric:        Mood and Affect: Mood normal.        Behavior: Behavior normal.        Thought Content: Thought content normal.        Judgment: Judgment normal.     Data: I reviewed the CT scan with the patient was demonstrates 5.6 cm infrarenal bilobed aneurysm.     Assessment/Plan:     85 year old male with abdominal aortic aneurysm.  We obtained a CT scan which demonstrates a bilobed infrarenal aneurysm.  I reviewed the CT scan.  We will proceed with endovascular aneurysm repair will possibly need  endoanchors.  There are 2 left renal arteries will possibly need to cover the most inferior.  Possibly will require snorkel.  I discussed all of these possibilities with the patient including expected at least 1 night stay in the hospital would be more if we have any complications with the groin or otherwise.  We will get the go ahead from Dr. Camillia Herter office and proceed in the near future.     Waynetta Sandy MD [facility]

## 2019-01-02 NOTE — Telephone Encounter (Signed)
   Primary Cardiologist: Lauree Chandler, MD  Chart reviewed as part of pre-operative protocol coverage. Given past medical history and time since last visit, based on ACC/AHA guidelines, Brian [name] would be at acceptable risk for the planned procedure without further cardiovascular testing.  [facility] callback staff, please call the patient and give him the update that Dr. Angelena Form finalized his clearance and did not think he needed any other cardiac testing beforehand. I had told him we would let him know when we heard back. He said he would be outside working in the yard, but gave permission to leave detailed message on his voicemail.  I will route this recommendation to the requesting party via Epic routing function (no fax number given but member is active in [place]). Will remove from pre-op pool. Please call with questions.  Charlie Pitter, PA-C 01/02/2019, 1:59 PM

## 2019-01-02 NOTE — Telephone Encounter (Signed)
   Primary Cardiologist: Lauree Chandler, MD  Chart reviewed as part of pre-operative protocol coverage. Patient was contacted 01/02/2019 in reference to pre-operative risk assessment for pending surgery as outlined below.  Brian [name] was last seen on 10/16/18 by Dr. Angelena Form. He has history of CAD s/p CABG 2012, HTN, HLD, DM, COPD, AAA, normal LVEF, mild MR by echo 2017. Based on RCRI risk is 6.6% (moderate).  I reached out to patient who affirms he is doing well. He has chronic DOE from his COPD but reports this is stable. He walks for 30 minutes 3-4 days a week and has not had any recent chest pain or accelerating dyspnea. Given nature of procedure (AAA/vascular), will route to Dr. Angelena Form who knows patient well for input whether any updated ischemic testing would be necessary prior to procedure. Dr. Angelena Form - Please route response to P CV DIV PREOP (the pre-op pool). Thank you.  Charlie Pitter, PA-C 01/02/2019, 1:28 PM

## 2019-01-02 NOTE — Telephone Encounter (Signed)
Pt has been he has been cleared for surgery. Pt thanked me for the call.

## 2019-01-09 ENCOUNTER — Telehealth: Payer: Self-pay

## 2019-01-09 DIAGNOSIS — R911 Solitary pulmonary nodule: Secondary | ICD-10-CM

## 2019-01-09 NOTE — Telephone Encounter (Signed)
LVM for pt to call back in regards.  

## 2019-01-09 NOTE — Telephone Encounter (Signed)
Copied from [place] [phone]. Topic: General - Inquiry >> Jan 09, 2019 12:37 PM Alease Frame wrote: Reason for CRM: Patient would like Dr Quay Burow to take a look at recent CT Scan and possibly give a referral. Please advise

## 2019-01-12 NOTE — Telephone Encounter (Signed)
Pt would like you to take a look at his recent CT scan. He states there was something on there about a lung nodule and he wanted to see if he needs to either see you or have a referral in regards. Please advise.

## 2019-01-13 NOTE — Telephone Encounter (Signed)
I have ordered a referral for pulmonary.

## 2019-01-14 NOTE — Telephone Encounter (Signed)
Pt aware.

## 2019-01-16 NOTE — Pre-Procedure Instructions (Signed)
[name]  01/16/2019      [facility] [place], [address] AT [address] [place] [address] Phone: [phone] Fax: [phone]    Your procedure is scheduled on Dec.2  Report to [facility] Entrance A at 6:30 A.M.  Call this number if you have problems the morning of surgery:  [phone]   Remember:  Do not eat or drink after midnight.      Take these medicines the morning of surgery with A SIP OF WATER :              anoro ellipta --bring to hospital             Aspirin             flonase nasal spray if needed             Eye drop             toprolol xl             7 days prior to surgery STOP taking  Aleve, Naproxen, Ibuprofen, Motrin, Advil, Goody's, BC's, all herbal medications, fish oil, and all vitamins.                How to Manage Your Diabetes Before and After Surgery  Why is it important to control my blood sugar before and after surgery? . Improving blood sugar levels before and after surgery helps healing and can limit problems. . A way of improving blood sugar control is eating a healthy diet by: o  Eating less sugar and carbohydrates o  Increasing activity/exercise o  Talking with your doctor about reaching your blood sugar goals . High blood sugars (greater than 180 mg/dL) can raise your risk of infections and slow your recovery, so you will need to focus on controlling your diabetes during the weeks before surgery. . Make sure that the doctor who takes care of your diabetes knows about your planned surgery including the date and location.  How do I manage my blood sugar before surgery? . Check your blood sugar at least 4 times a day, starting 2 days before surgery, to make sure that the level is not too high or low. o Check your blood sugar the morning of your surgery when you wake up and every 2 hours until you get to the Short Stay unit. . If your  blood sugar is less than 70 mg/dL, you will need to treat for low blood sugar: o Do not take insulin. o Treat a low blood sugar (less than 70 mg/dL) with  cup of clear juice (cranberry or apple), 4 glucose tablets, OR glucose gel. Recheck blood sugar in 15 minutes after treatment (to make sure it is greater than 70 mg/dL). If your blood sugar is not greater than 70 mg/dL on recheck, call [phone] o  for further instructions. . Report your blood sugar to the short stay nurse when you get to Short Stay.  . If you are admitted to the hospital after surgery: o Your blood sugar will be checked by the staff and you will probably be given insulin after surgery (instead of oral diabetes medicines) to make sure you have good blood sugar levels. o The goal for blood sugar control after surgery is 80-180 mg/dL.        WHAT DO I DO ABOUT MY DIABETES MEDICATION?   Marland Kitchen  Do not take oral diabetes medicines (pills) the morning of surgery.        Do not wear jewelry.  Do not wear lotions, powders, or perfumes, or deodorant.  Do not shave 48 hours prior to surgery.  Men may shave face and neck.  Do not bring valuables to the hospital.  [facility] is not responsible for any belongings or valuables.  Contacts, dentures or bridgework may not be worn into surgery.  Leave your suitcase in the car.  After surgery it may be brought to your room.  For patients admitted to the hospital, discharge time will be determined by your treatment team.  Patients discharged the day of surgery will not be allowed to drive home.    Special instructions:   [place]- Preparing For Surgery  Before surgery, you can play an important role. Because skin is not sterile, your skin needs to be as free of germs as possible. You can reduce the number of germs on your skin by washing with CHG (chlorahexidine gluconate) Soap before surgery.  CHG is an antiseptic cleaner which kills germs and bonds with the skin to  continue killing germs even after washing.    Oral Hygiene is also important to reduce your risk of infection.  Remember - BRUSH YOUR TEETH THE MORNING OF SURGERY WITH YOUR REGULAR TOOTHPASTE  Please do not use if you have an allergy to CHG or antibacterial soaps. If your skin becomes reddened/irritated stop using the CHG.  Do not shave (including legs and underarms) for at least 48 hours prior to first CHG shower. It is OK to shave your face.  Please follow these instructions carefully.   1. Shower the NIGHT BEFORE SURGERY and the MORNING OF SURGERY with CHG.   2. If you chose to wash your hair, wash your hair first as usual with your normal shampoo.  3. After you shampoo, rinse your hair and body thoroughly to remove the shampoo.  4. Use CHG as you would any other liquid soap. You can apply CHG directly to the skin and wash gently with a scrungie or a clean washcloth.   5. Apply the CHG Soap to your body ONLY FROM THE NECK DOWN.  Do not use on open wounds or open sores. Avoid contact with your eyes, ears, mouth and genitals (private parts). Wash Face and genitals (private parts)  with your normal soap.  6. Wash thoroughly, paying special attention to the area where your surgery will be performed.  7. Thoroughly rinse your body with warm water from the neck down.  8. DO NOT shower/wash with your normal soap after using and rinsing off the CHG Soap.  9. Pat yourself dry with a CLEAN TOWEL.  10. Wear CLEAN PAJAMAS to bed the night before surgery, wear comfortable clothes the morning of surgery  11. Place CLEAN SHEETS on your bed the night of your first shower and DO NOT SLEEP WITH PETS.    Day of Surgery:  Do not apply any deodorants/lotions.  Please wear clean clothes to the hospital/surgery center.   Remember to brush your teeth WITH YOUR REGULAR TOOTHPASTE.    Please read over the following fact sheets that you were given. Coughing and Deep Breathing, MRSA Information and  Surgical Site Infection Prevention

## 2019-01-19 ENCOUNTER — Encounter (HOSPITAL_COMMUNITY): Payer: Self-pay

## 2019-01-19 ENCOUNTER — Encounter (HOSPITAL_COMMUNITY)
Admission: RE | Admit: 2019-01-19 | Discharge: 2019-01-19 | Disposition: A | Discharge status: Home / Self Care | Payer: Medicare Other | Source: Ambulatory Visit | Attending: Vascular Surgery | Admitting: Vascular Surgery

## 2019-01-19 ENCOUNTER — Other Ambulatory Visit (HOSPITAL_COMMUNITY)
Admission: RE | Admit: 2019-01-19 | Discharge: 2019-01-19 | Disposition: A | Discharge status: Home / Self Care | Payer: Medicare Other | Source: Ambulatory Visit | Attending: Vascular Surgery | Admitting: Vascular Surgery

## 2019-01-19 ENCOUNTER — Other Ambulatory Visit: Payer: Self-pay

## 2019-01-19 DIAGNOSIS — E1122 Type 2 diabetes mellitus with diabetic chronic kidney disease: Secondary | ICD-10-CM | POA: Diagnosis not present

## 2019-01-19 DIAGNOSIS — M109 Gout, unspecified: Secondary | ICD-10-CM | POA: Diagnosis not present

## 2019-01-19 DIAGNOSIS — E119 Type 2 diabetes mellitus without complications: Secondary | ICD-10-CM | POA: Insufficient documentation

## 2019-01-19 DIAGNOSIS — Z01812 Encounter for preprocedural laboratory examination: Secondary | ICD-10-CM | POA: Insufficient documentation

## 2019-01-19 DIAGNOSIS — Z87891 Personal history of nicotine dependence: Secondary | ICD-10-CM | POA: Diagnosis not present

## 2019-01-19 DIAGNOSIS — Z951 Presence of aortocoronary bypass graft: Secondary | ICD-10-CM | POA: Diagnosis not present

## 2019-01-19 DIAGNOSIS — K08109 Complete loss of teeth, unspecified cause, unspecified class: Secondary | ICD-10-CM | POA: Diagnosis not present

## 2019-01-19 DIAGNOSIS — E1140 Type 2 diabetes mellitus with diabetic neuropathy, unspecified: Secondary | ICD-10-CM | POA: Diagnosis not present

## 2019-01-19 DIAGNOSIS — Z888 Allergy status to other drugs, medicaments and biological substances status: Secondary | ICD-10-CM | POA: Diagnosis not present

## 2019-01-19 DIAGNOSIS — J449 Chronic obstructive pulmonary disease, unspecified: Secondary | ICD-10-CM | POA: Diagnosis not present

## 2019-01-19 DIAGNOSIS — I129 Hypertensive chronic kidney disease with stage 1 through stage 4 chronic kidney disease, or unspecified chronic kidney disease: Secondary | ICD-10-CM | POA: Diagnosis not present

## 2019-01-19 DIAGNOSIS — E1151 Type 2 diabetes mellitus with diabetic peripheral angiopathy without gangrene: Secondary | ICD-10-CM | POA: Diagnosis not present

## 2019-01-19 DIAGNOSIS — I714 Abdominal aortic aneurysm, without rupture: Secondary | ICD-10-CM | POA: Diagnosis not present

## 2019-01-19 DIAGNOSIS — I252 Old myocardial infarction: Secondary | ICD-10-CM | POA: Diagnosis not present

## 2019-01-19 DIAGNOSIS — N183 Chronic kidney disease, stage 3 unspecified: Secondary | ICD-10-CM | POA: Diagnosis not present

## 2019-01-19 DIAGNOSIS — E785 Hyperlipidemia, unspecified: Secondary | ICD-10-CM | POA: Diagnosis not present

## 2019-01-19 DIAGNOSIS — I2510 Atherosclerotic heart disease of native coronary artery without angina pectoris: Secondary | ICD-10-CM | POA: Diagnosis not present

## 2019-01-19 DIAGNOSIS — M189 Osteoarthritis of first carpometacarpal joint, unspecified: Secondary | ICD-10-CM | POA: Diagnosis not present

## 2019-01-19 DIAGNOSIS — H9190 Unspecified hearing loss, unspecified ear: Secondary | ICD-10-CM | POA: Diagnosis not present

## 2019-01-19 HISTORY — DX: Carpal tunnel syndrome, left upper limb: G56.02

## 2019-01-19 HISTORY — DX: Unspecified osteoarthritis, unspecified site: M19.90

## 2019-01-19 HISTORY — DX: Dyspnea, unspecified: R06.00

## 2019-01-19 HISTORY — DX: Unilateral inguinal hernia, without obstruction or gangrene, not specified as recurrent: K40.90

## 2019-01-19 LAB — COMPREHENSIVE METABOLIC PANEL
ALT: 18 U/L (ref 0–44)
AST: 21 U/L (ref 15–41)
Albumin: 4.1 g/dL (ref 3.5–5.0)
Alkaline Phosphatase: 61 U/L (ref 38–126)
Anion gap: 8 (ref 5–15)
BUN: 21 mg/dL (ref 8–23)
CO2: 25 mmol/L (ref 22–32)
Calcium: 9.5 mg/dL (ref 8.9–10.3)
Chloride: 106 mmol/L (ref 98–111)
Creatinine, Ser: 1.48 mg/dL — ABNORMAL HIGH (ref 0.61–1.24)
GFR calc Af Amer: 50 mL/min — ABNORMAL LOW (ref >60)
GFR calc non Af Amer: 43 mL/min — ABNORMAL LOW (ref >60)
Glucose, Bld: 103 mg/dL — ABNORMAL HIGH (ref 70–99)
Potassium: 4.7 mmol/L (ref 3.5–5.1)
Sodium: 139 mmol/L (ref 135–145)
Total Bilirubin: 0.8 mg/dL (ref 0.3–1.2)
Total Protein: 7.2 g/dL (ref 6.5–8.1)

## 2019-01-19 LAB — TYPE AND SCREEN
ABO/RH(D): B POS
Antibody Screen: NEGATIVE

## 2019-01-19 LAB — SURGICAL PCR SCREEN
MRSA, PCR: NEGATIVE
Staphylococcus aureus: NEGATIVE

## 2019-01-19 LAB — URINALYSIS, ROUTINE W REFLEX MICROSCOPIC
Bilirubin Urine: NEGATIVE
Glucose, UA: NEGATIVE mg/dL
Hgb urine dipstick: NEGATIVE
Ketones, ur: NEGATIVE mg/dL
Leukocytes,Ua: NEGATIVE
Nitrite: NEGATIVE
Protein, ur: NEGATIVE mg/dL
Specific Gravity, Urine: 1.013 (ref 1.005–1.030)
pH: 7 (ref 5.0–8.0)

## 2019-01-19 LAB — CBC
HCT: 41.7 % (ref 39.0–52.0)
Hemoglobin: 13.3 g/dL (ref 13.0–17.0)
MCH: 30.5 pg (ref 26.0–34.0)
MCHC: 31.9 g/dL (ref 30.0–36.0)
MCV: 95.6 fL (ref 80.0–100.0)
Platelets: 199 10*3/uL (ref 150–400)
RBC: 4.36 MIL/uL (ref 4.22–5.81)
RDW: 12.2 % (ref 11.5–15.5)
WBC: 5.9 10*3/uL (ref 4.0–10.5)
nRBC: 0 % (ref 0.0–0.2)

## 2019-01-19 LAB — SARS CORONAVIRUS 2 (TAT 6-24 HRS): SARS Coronavirus 2: NEGATIVE

## 2019-01-19 LAB — PROTIME-INR
INR: 1.1 (ref 0.8–1.2)
Prothrombin Time: 13.9 seconds (ref 11.4–15.2)

## 2019-01-19 LAB — APTT: aPTT: 31 seconds (ref 24–36)

## 2019-01-19 LAB — HEMOGLOBIN A1C
Hgb A1c MFr Bld: 6.4 % — ABNORMAL HIGH (ref 4.8–5.6)
Mean Plasma Glucose: 136.98 mg/dL

## 2019-01-19 LAB — GLUCOSE, CAPILLARY: Glucose-Capillary: 88 mg/dL (ref 70–99)

## 2019-01-19 NOTE — Anesthesia Preprocedure Evaluation (Addendum)
Anesthesia Evaluation  Patient identified by MRN, date of birth, ID band Patient awake    Reviewed: Allergy & Precautions, NPO status , Patient's Chart, lab work & pertinent test results  Airway Mallampati: II  TM Distance: >3 FB Neck ROM: Full    Dental  (+) Edentulous Upper, Edentulous Lower   Pulmonary former smoker,    breath sounds clear to auscultation       Cardiovascular hypertension,  Rhythm:Regular Rate:Normal     Neuro/Psych    GI/Hepatic   Endo/Other  diabetes  Renal/GU      Musculoskeletal   Abdominal   Peds  Hematology   Anesthesia Other Findings   Reproductive/Obstetrics                            Anesthesia Physical Anesthesia Plan  ASA: III  Anesthesia Plan: General   Post-op Pain Management:    Induction: Intravenous  PONV Risk Score and Plan: Ondansetron and Dexamethasone  Airway Management Planned: Oral ETT  Additional Equipment: Arterial line and CVP  Intra-op Plan:   Post-operative Plan: Extubation in OR  Informed Consent: I have reviewed the patients History and Physical, chart, labs and discussed the procedure including the risks, benefits and alternatives for the proposed anesthesia with the patient or authorized representative who has indicated his/her understanding and acceptance.       Plan Discussed with: CRNA and Anesthesiologist  Anesthesia Plan Comments: (Follows with cardiology for hx  of CAD s/p CABG 2012, HTN, HLD, DM, COPD, AAA, normal LVEF, mild MR by echo 2017.  Last seen on 10/16/18 by Dr. Angelena Form.   Per telephone encounter 01/02/19 "Based on RCRI risk is 6.6% (moderate). I reached out to patient who affirms he is doing well. He has chronic DOE from his COPD but reports this is stable. He walks for 30 minutes 3-4 days a week and has not had any recent chest pain or accelerating dyspnea. Given nature of procedure (AAA/vascular), will  route to Dr. Angelena Form who knows patient well for input whether any updated ischemic testing would be necessary prior to procedure." Dr. Angelena Form responded 01/02/19 stating "I would not recommend ischemic testing. He can proceed with his planned procedure since he is doing well clinically."   Proep labs reviewed, creatinine mildly elevated 1.48, review of previous shows baseline ~ 1.50. DMII well controlled A1c 6.4   EKG 01/28/18: Sinus rhythm. Rate 77. Prolonged PR interval. RBBB and LAFB.   TTE 05/12/15: - Left ventricle: The cavity size was normal. There was mild focal   basal hypertrophy of the septum. Systolic function was normal.   The estimated ejection fraction was in the range of 55% to 60%.   Wall motion was normal; there were no regional wall motion   abnormalities. Doppler parameters are consistent with abnormal   left ventricular relaxation (grade 1 diastolic dysfunction).   Doppler parameters are consistent with high ventricular filling   pressure. - Mitral valve: There was mild regurgitation. - Right ventricle: The cavity size was mildly dilated. - Right atrium: The atrium was mildly dilated.  Impressions:  - Normal LV systolic function; grade 1 diastolic dysfunction; mild   MR; mild RAE and RVE; trace TR. )       Anesthesia Quick Evaluation

## 2019-01-19 NOTE — Progress Notes (Signed)
Anesthesia Chart Review: Follows with cardiology for hx  of CAD s/p CABG 2012, HTN, HLD, DM, COPD, AAA, normal LVEF, mild MR by echo 2017.  Last seen on 10/16/18 by Dr. Angelena Form.   Per telephone encounter 01/02/19 "Based on RCRI risk is 6.6% (moderate). I reached out to patient who affirms he is doing well. He has chronic DOE from his COPD but reports this is stable. He walks for 30 minutes 3-4 days a week and has not had any recent chest pain or accelerating dyspnea. Given nature of procedure (AAA/vascular), will route to Dr. Angelena Form who knows patient well for input whether any updated ischemic testing would be necessary prior to procedure." Dr. Angelena Form responded 01/02/19 stating "I would not recommend ischemic testing. He can proceed with his planned procedure since he is doing well clinically."   Proep labs reviewed, creatinine mildly elevated 1.48, review of previous shows baseline ~ 1.50. DMII well controlled A1c 6.4   EKG 01/28/18: Sinus rhythm. Rate 77. Prolonged PR interval. RBBB and LAFB.   TTE 05/12/15: - Left ventricle: The cavity size was normal. There was mild focal   basal hypertrophy of the septum. Systolic function was normal.   The estimated ejection fraction was in the range of 55% to 60%.   Wall motion was normal; there were no regional wall motion   abnormalities. Doppler parameters are consistent with abnormal   left ventricular relaxation (grade 1 diastolic dysfunction).   Doppler parameters are consistent with high ventricular filling   pressure. - Mitral valve: There was mild regurgitation. - Right ventricle: The cavity size was mildly dilated. - Right atrium: The atrium was mildly dilated.  Impressions:  - Normal LV systolic function; grade 1 diastolic dysfunction; mild   MR; mild RAE and RVE; trace TR.  Brian [name] [facility] Short Stay Center/Anesthesiology Phone [phone] 01/19/2019 4:03 PM

## 2019-01-19 NOTE — Progress Notes (Signed)
PCP - Dr. Jenness Corner  Cardiologist - Dr. Julianne Handler  Chest x-ray - 01/28/18 (E)  EKG - 01/28/18 (E)  Stress Test - 01/28/18 (E)  ECHO - 05/12/15 (E)  Cardiac Cath - Denies  AICD-na PM-na LOOP-na  Sleep Study - Denies CPAP - None  LABS- 01/19/2019: CBC, CMP, PT, PTT, T/S, UA, PCR, COVID  ASA- Cont.  ERAS-No  HA1C- 01/19/2019 Fasting Blood Sugar - 88-126, today 88 Checks Blood Sugar ___4__ times a week  Anesthesia- Yes- cardiac history  Pt denies having chest pain, sob, or fever at this time. All instructions explained to the pt, with a verbal understanding of the material. Pt agrees to go over the instructions while at home for a better understanding. Pt also instructed to self quarantine after being tested for COVID-19. The opportunity to ask questions was provided.   Coronavirus Screening  Have you experienced the following symptoms:  Cough yes/no: No Fever (>100.33F)  yes/no: No Runny nose yes/no: No Sore throat yes/no: No Difficulty breathing/shortness of breath  yes/no: No  Have you or a family member traveled in the last 14 days and where? yes/no: No   If the patient indicates "YES" to the above questions, their PAT will be rescheduled to limit the exposure to others and, the surgeon will be notified. THE PATIENT WILL NEED TO BE ASYMPTOMATIC FOR 14 DAYS.   If the patient is not experiencing any of these symptoms, the PAT nurse will instruct them to NOT bring anyone with them to their appointment since they may have these symptoms or traveled as well.   Please remind your patients and families that hospital visitation restrictions are in effect and the importance of the restrictions.

## 2019-01-21 ENCOUNTER — Inpatient Hospital Stay (HOSPITAL_COMMUNITY): Payer: Medicare Other | Admitting: Physician Assistant

## 2019-01-21 ENCOUNTER — Encounter (HOSPITAL_COMMUNITY)
Admission: RE | Disposition: A | Discharge status: Home / Self Care | Payer: Self-pay | Source: Home / Self Care | Attending: Vascular Surgery

## 2019-01-21 ENCOUNTER — Inpatient Hospital Stay (HOSPITAL_COMMUNITY)
Admission: RE | Admit: 2019-01-21 | Discharge: 2019-01-22 | DRG: 269 | Disposition: A | Discharge status: Home / Self Care | Payer: Medicare Other | Attending: Vascular Surgery | Admitting: Vascular Surgery

## 2019-01-21 ENCOUNTER — Inpatient Hospital Stay (HOSPITAL_COMMUNITY): Payer: Medicare Other

## 2019-01-21 ENCOUNTER — Other Ambulatory Visit: Payer: Self-pay

## 2019-01-21 ENCOUNTER — Encounter (HOSPITAL_COMMUNITY): Payer: Self-pay

## 2019-01-21 ENCOUNTER — Inpatient Hospital Stay (HOSPITAL_COMMUNITY): Payer: Medicare Other | Admitting: Anesthesiology

## 2019-01-21 DIAGNOSIS — M189 Osteoarthritis of first carpometacarpal joint, unspecified: Secondary | ICD-10-CM | POA: Diagnosis present

## 2019-01-21 DIAGNOSIS — E785 Hyperlipidemia, unspecified: Secondary | ICD-10-CM | POA: Diagnosis not present

## 2019-01-21 DIAGNOSIS — E1122 Type 2 diabetes mellitus with diabetic chronic kidney disease: Secondary | ICD-10-CM | POA: Diagnosis present

## 2019-01-21 DIAGNOSIS — I129 Hypertensive chronic kidney disease with stage 1 through stage 4 chronic kidney disease, or unspecified chronic kidney disease: Secondary | ICD-10-CM | POA: Diagnosis not present

## 2019-01-21 DIAGNOSIS — N183 Chronic kidney disease, stage 3 unspecified: Secondary | ICD-10-CM | POA: Diagnosis present

## 2019-01-21 DIAGNOSIS — E1151 Type 2 diabetes mellitus with diabetic peripheral angiopathy without gangrene: Secondary | ICD-10-CM | POA: Diagnosis present

## 2019-01-21 DIAGNOSIS — I714 Abdominal aortic aneurysm, without rupture, unspecified: Secondary | ICD-10-CM | POA: Diagnosis present

## 2019-01-21 DIAGNOSIS — Z87891 Personal history of nicotine dependence: Secondary | ICD-10-CM | POA: Diagnosis not present

## 2019-01-21 DIAGNOSIS — H9190 Unspecified hearing loss, unspecified ear: Secondary | ICD-10-CM | POA: Diagnosis present

## 2019-01-21 DIAGNOSIS — J449 Chronic obstructive pulmonary disease, unspecified: Secondary | ICD-10-CM | POA: Diagnosis not present

## 2019-01-21 DIAGNOSIS — I252 Old myocardial infarction: Secondary | ICD-10-CM | POA: Diagnosis not present

## 2019-01-21 DIAGNOSIS — M109 Gout, unspecified: Secondary | ICD-10-CM | POA: Diagnosis not present

## 2019-01-21 DIAGNOSIS — Z20828 Contact with and (suspected) exposure to other viral communicable diseases: Secondary | ICD-10-CM | POA: Diagnosis present

## 2019-01-21 DIAGNOSIS — Z8679 Personal history of other diseases of the circulatory system: Secondary | ICD-10-CM

## 2019-01-21 DIAGNOSIS — Z452 Encounter for adjustment and management of vascular access device: Secondary | ICD-10-CM | POA: Diagnosis not present

## 2019-01-21 DIAGNOSIS — Z951 Presence of aortocoronary bypass graft: Secondary | ICD-10-CM

## 2019-01-21 DIAGNOSIS — Z888 Allergy status to other drugs, medicaments and biological substances status: Secondary | ICD-10-CM

## 2019-01-21 DIAGNOSIS — I2510 Atherosclerotic heart disease of native coronary artery without angina pectoris: Secondary | ICD-10-CM | POA: Diagnosis present

## 2019-01-21 DIAGNOSIS — K08109 Complete loss of teeth, unspecified cause, unspecified class: Secondary | ICD-10-CM | POA: Diagnosis present

## 2019-01-21 DIAGNOSIS — E1140 Type 2 diabetes mellitus with diabetic neuropathy, unspecified: Secondary | ICD-10-CM | POA: Diagnosis not present

## 2019-01-21 HISTORY — PX: ABDOMINAL AORTIC ENDOVASCULAR STENT GRAFT: SHX5707

## 2019-01-21 HISTORY — PX: ENDOVASCULAR STENT GRAFT (AAA): CATH118280

## 2019-01-21 LAB — BASIC METABOLIC PANEL
Anion gap: 9 (ref 5–15)
BUN: 18 mg/dL (ref 8–23)
CO2: 24 mmol/L (ref 22–32)
Calcium: 8.7 mg/dL — ABNORMAL LOW (ref 8.9–10.3)
Chloride: 105 mmol/L (ref 98–111)
Creatinine, Ser: 1.3 mg/dL — ABNORMAL HIGH (ref 0.61–1.24)
GFR calc Af Amer: 58 mL/min — ABNORMAL LOW (ref >60)
GFR calc non Af Amer: 50 mL/min — ABNORMAL LOW (ref >60)
Glucose, Bld: 120 mg/dL — ABNORMAL HIGH (ref 70–99)
Potassium: 4.8 mmol/L (ref 3.5–5.1)
Sodium: 138 mmol/L (ref 135–145)

## 2019-01-21 LAB — CBC
HCT: 35 % — ABNORMAL LOW (ref 39.0–52.0)
Hemoglobin: 11.5 g/dL — ABNORMAL LOW (ref 13.0–17.0)
MCH: 30.9 pg (ref 26.0–34.0)
MCHC: 32.9 g/dL (ref 30.0–36.0)
MCV: 94.1 fL (ref 80.0–100.0)
Platelets: 158 10*3/uL (ref 150–400)
RBC: 3.72 MIL/uL — ABNORMAL LOW (ref 4.22–5.81)
RDW: 12.1 % (ref 11.5–15.5)
WBC: 5.4 10*3/uL (ref 4.0–10.5)
nRBC: 0 % (ref 0.0–0.2)

## 2019-01-21 LAB — POCT ACTIVATED CLOTTING TIME: Activated Clotting Time: 197 seconds

## 2019-01-21 LAB — APTT: aPTT: 37 seconds — ABNORMAL HIGH (ref 24–36)

## 2019-01-21 LAB — GLUCOSE, CAPILLARY
Glucose-Capillary: 98 mg/dL (ref 70–99)
Glucose-Capillary: 116 mg/dL — ABNORMAL HIGH (ref 70–99)

## 2019-01-21 LAB — PROTIME-INR
INR: 1.2 (ref 0.8–1.2)
Prothrombin Time: 15.3 seconds — ABNORMAL HIGH (ref 11.4–15.2)

## 2019-01-21 LAB — MAGNESIUM: Magnesium: 1.8 mg/dL (ref 1.7–2.4)

## 2019-01-21 SURGERY — INSERTION, ENDOVASCULAR STENT GRAFT, AORTA, ABDOMINAL
Anesthesia: General

## 2019-01-21 MED ORDER — CHLORHEXIDINE GLUCONATE CLOTH 2 % EX PADS: 6.0000 | MEDICATED_PAD | Freq: Once | CUTANEOUS | Status: DC

## 2019-01-21 MED ORDER — SUCCINYLCHOLINE CHLORIDE 200 MG/10ML IV SOSY
PREFILLED_SYRINGE | INTRAVENOUS | Status: AC
  Filled 2019-01-21: qty 10

## 2019-01-21 MED ORDER — LIDOCAINE 2% (20 MG/ML) 5 ML SYRINGE
INTRAMUSCULAR | Status: AC
  Filled 2019-01-21: qty 5

## 2019-01-21 MED ORDER — PROTAMINE SULFATE 10 MG/ML IV SOLN
INTRAVENOUS | Status: DC | PRN
  Administered 2019-01-21: 30 mg via INTRAVENOUS
  Administered 2019-01-21: 20 mg via INTRAVENOUS

## 2019-01-21 MED ORDER — PANTOPRAZOLE SODIUM 40 MG PO TBEC
40.0000 mg | DELAYED_RELEASE_TABLET | Freq: Every day | ORAL | Status: DC
  Administered 2019-01-21 – 2019-01-22 (×2): 40 mg via ORAL
  Filled 2019-01-21 (×2): qty 1

## 2019-01-21 MED ORDER — POLYVINYL ALCOHOL 1.4 % OP SOLN
1.0000 [drp] | Freq: Every day | OPHTHALMIC | Status: DC
  Administered 2019-01-22: 1 [drp] via OPHTHALMIC
  Filled 2019-01-21: qty 15

## 2019-01-21 MED ORDER — SIMVASTATIN 20 MG PO TABS
40.0000 mg | ORAL_TABLET | Freq: Every day | ORAL | Status: DC
  Administered 2019-01-21: 40 mg via ORAL
  Filled 2019-01-21: qty 2

## 2019-01-21 MED ORDER — HEPARIN SODIUM (PORCINE) 1000 UNIT/ML IJ SOLN
INTRAMUSCULAR | Status: DC | PRN
  Administered 2019-01-21: 3000 [IU] via INTRAVENOUS
  Administered 2019-01-21: 8000 [IU] via INTRAVENOUS

## 2019-01-21 MED ORDER — HYDRALAZINE HCL 20 MG/ML IJ SOLN
5.0000 mg | INTRAMUSCULAR | Status: DC | PRN
  Filled 2019-01-21: qty 0.25

## 2019-01-21 MED ORDER — SODIUM CHLORIDE 0.9 % IV SOLN
INTRAVENOUS | Status: DC
  Administered 2019-01-21: 14:00:00 via INTRAVENOUS

## 2019-01-21 MED ORDER — GLYCOPYRROLATE 0.2 MG/ML IJ SOLN
INTRAMUSCULAR | Status: DC | PRN
  Administered 2019-01-21 (×2): 0.1 mg via INTRAVENOUS

## 2019-01-21 MED ORDER — PHENYLEPHRINE 40 MCG/ML (10ML) SYRINGE FOR IV PUSH (FOR BLOOD PRESSURE SUPPORT)
PREFILLED_SYRINGE | INTRAVENOUS | Status: AC
  Filled 2019-01-21: qty 10

## 2019-01-21 MED ORDER — IODIXANOL 320 MG/ML IV SOLN
INTRAVENOUS | Status: DC | PRN
  Administered 2019-01-21: 70.8 mL via INTRA_ARTERIAL

## 2019-01-21 MED ORDER — MIDAZOLAM HCL 5 MG/5ML IJ SOLN
INTRAMUSCULAR | Status: DC | PRN
  Administered 2019-01-21: 1 mg via INTRAVENOUS

## 2019-01-21 MED ORDER — PROTAMINE SULFATE 10 MG/ML IV SOLN
INTRAVENOUS | Status: AC
  Filled 2019-01-21: qty 5

## 2019-01-21 MED ORDER — BISACODYL 10 MG RE SUPP: 10.0000 mg | Freq: Every day | RECTAL | Status: DC | PRN

## 2019-01-21 MED ORDER — GABAPENTIN 100 MG PO CAPS
200.0000 mg | ORAL_CAPSULE | Freq: Every day | ORAL | Status: DC
  Administered 2019-01-21: 200 mg via ORAL
  Filled 2019-01-21: qty 2

## 2019-01-21 MED ORDER — EPHEDRINE SULFATE 50 MG/ML IJ SOLN
INTRAMUSCULAR | Status: DC | PRN
  Administered 2019-01-21: 2.5 mg via INTRAVENOUS
  Administered 2019-01-21: 5 mg via INTRAVENOUS

## 2019-01-21 MED ORDER — SODIUM CHLORIDE 0.9 % IV SOLN: 500.0000 mL | Freq: Once | INTRAVENOUS | Status: DC | PRN

## 2019-01-21 MED ORDER — ONDANSETRON HCL 4 MG/2ML IJ SOLN
4.0000 mg | Freq: Four times a day (QID) | INTRAMUSCULAR | Status: DC | PRN
  Administered 2019-01-21: 4 mg via INTRAVENOUS
  Filled 2019-01-21: qty 2

## 2019-01-21 MED ORDER — POTASSIUM CHLORIDE CRYS ER 20 MEQ PO TBCR: 20.0000 meq | EXTENDED_RELEASE_TABLET | Freq: Every day | ORAL | Status: DC | PRN

## 2019-01-21 MED ORDER — ALUM & MAG HYDROXIDE-SIMETH 200-200-20 MG/5ML PO SUSP: 15.0000 mL | ORAL | Status: DC | PRN

## 2019-01-21 MED ORDER — SODIUM CHLORIDE 0.9 % IV SOLN
INTRAVENOUS | Status: AC
  Filled 2019-01-21: qty 1.2

## 2019-01-21 MED ORDER — CEFAZOLIN SODIUM-DEXTROSE 2-4 GM/100ML-% IV SOLN
2.0000 g | INTRAVENOUS | Status: AC
  Administered 2019-01-21: 2 g via INTRAVENOUS
  Filled 2019-01-21: qty 100

## 2019-01-21 MED ORDER — PROPOFOL 10 MG/ML IV BOLUS
INTRAVENOUS | Status: AC
  Filled 2019-01-21: qty 20

## 2019-01-21 MED ORDER — GLYCOPYRROLATE PF 0.2 MG/ML IJ SOSY
PREFILLED_SYRINGE | INTRAMUSCULAR | Status: AC
  Filled 2019-01-21: qty 1

## 2019-01-21 MED ORDER — PROPOFOL 10 MG/ML IV BOLUS
INTRAVENOUS | Status: DC | PRN
  Administered 2019-01-21: 150 mg via INTRAVENOUS

## 2019-01-21 MED ORDER — IODIXANOL 320 MG/ML IV SOLN: INTRAVENOUS | Status: DC | PRN

## 2019-01-21 MED ORDER — TRIAMCINOLONE ACETONIDE 0.1 % EX CREA: 1.0000 "application " | TOPICAL_CREAM | Freq: Every day | CUTANEOUS | Status: DC | PRN

## 2019-01-21 MED ORDER — ONDANSETRON HCL 4 MG/2ML IJ SOLN
INTRAMUSCULAR | Status: AC
  Filled 2019-01-21: qty 2

## 2019-01-21 MED ORDER — LACTATED RINGERS IV SOLN
INTRAVENOUS | Status: DC | PRN
  Administered 2019-01-21: 08:00:00 via INTRAVENOUS

## 2019-01-21 MED ORDER — MIDAZOLAM HCL 2 MG/2ML IJ SOLN
INTRAMUSCULAR | Status: AC
  Filled 2019-01-21: qty 2

## 2019-01-21 MED ORDER — DEXAMETHASONE SODIUM PHOSPHATE 10 MG/ML IJ SOLN
INTRAMUSCULAR | Status: AC
  Filled 2019-01-21: qty 1

## 2019-01-21 MED ORDER — POLYETHYL GLYCOL-PROPYL GLYCOL 0.4-0.3 % OP SOLN: 1.0000 [drp] | Freq: Every day | OPHTHALMIC | Status: DC

## 2019-01-21 MED ORDER — FENTANYL CITRATE (PF) 250 MCG/5ML IJ SOLN
INTRAMUSCULAR | Status: AC
  Filled 2019-01-21: qty 5

## 2019-01-21 MED ORDER — OXYCODONE-ACETAMINOPHEN 5-325 MG PO TABS
1.0000 | ORAL_TABLET | ORAL | Status: DC | PRN
  Administered 2019-01-21: 2 via ORAL
  Filled 2019-01-21: qty 2

## 2019-01-21 MED ORDER — ADULT ASPIRIN LOW STRENGTH 81 MG PO TBDP: 81.0000 mg | ORAL_TABLET | Freq: Every day | ORAL | Status: DC

## 2019-01-21 MED ORDER — SODIUM CHLORIDE 0.9 % IV SOLN
INTRAVENOUS | Status: DC | PRN
  Administered 2019-01-21: 10:00:00

## 2019-01-21 MED ORDER — EPHEDRINE 5 MG/ML INJ
INTRAVENOUS | Status: AC
  Filled 2019-01-21: qty 10

## 2019-01-21 MED ORDER — UMECLIDINIUM-VILANTEROL 62.5-25 MCG/INH IN AEPB
1.0000 | INHALATION_SPRAY | Freq: Every day | RESPIRATORY_TRACT | Status: DC
  Administered 2019-01-22: 1 via RESPIRATORY_TRACT
  Filled 2019-01-21: qty 14

## 2019-01-21 MED ORDER — ROCURONIUM BROMIDE 100 MG/10ML IV SOLN
INTRAVENOUS | Status: DC | PRN
  Administered 2019-01-21: 60 mg via INTRAVENOUS

## 2019-01-21 MED ORDER — VITAMIN D 25 MCG (1000 UNIT) PO TABS
3000.0000 [IU] | ORAL_TABLET | Freq: Every day | ORAL | Status: DC
  Administered 2019-01-21 – 2019-01-22 (×2): 3000 [IU] via ORAL
  Filled 2019-01-21 (×4): qty 3

## 2019-01-21 MED ORDER — CEFAZOLIN SODIUM-DEXTROSE 2-4 GM/100ML-% IV SOLN
2.0000 g | Freq: Three times a day (TID) | INTRAVENOUS | Status: AC
  Administered 2019-01-21 – 2019-01-22 (×2): 2 g via INTRAVENOUS
  Filled 2019-01-21 (×2): qty 100

## 2019-01-21 MED ORDER — DOCUSATE SODIUM 100 MG PO CAPS
100.0000 mg | ORAL_CAPSULE | Freq: Every day | ORAL | Status: DC
  Administered 2019-01-22: 100 mg via ORAL
  Filled 2019-01-21: qty 1

## 2019-01-21 MED ORDER — ACETAMINOPHEN 325 MG PO TABS: 325.0000 mg | ORAL_TABLET | ORAL | Status: DC | PRN

## 2019-01-21 MED ORDER — SODIUM CHLORIDE 0.9 % IV SOLN: INTRAVENOUS | Status: DC

## 2019-01-21 MED ORDER — ONDANSETRON HCL 4 MG/2ML IJ SOLN
INTRAMUSCULAR | Status: DC | PRN
  Administered 2019-01-21: 4 mg via INTRAVENOUS

## 2019-01-21 MED ORDER — POLYETHYLENE GLYCOL 3350 17 G PO PACK: 17.0000 g | PACK | Freq: Every day | ORAL | Status: DC | PRN

## 2019-01-21 MED ORDER — HEPARIN SODIUM (PORCINE) 5000 UNIT/ML IJ SOLN: 5000.0000 [IU] | Freq: Three times a day (TID) | INTRAMUSCULAR | Status: DC

## 2019-01-21 MED ORDER — PHENOL 1.4 % MT LIQD: 1.0000 | OROMUCOSAL | Status: DC | PRN

## 2019-01-21 MED ORDER — MAGNESIUM SULFATE 2 GM/50ML IV SOLN: 2.0000 g | Freq: Every day | INTRAVENOUS | Status: DC | PRN

## 2019-01-21 MED ORDER — SUGAMMADEX SODIUM 200 MG/2ML IV SOLN
INTRAVENOUS | Status: DC | PRN
  Administered 2019-01-21: 300 mg via INTRAVENOUS

## 2019-01-21 MED ORDER — ACETAMINOPHEN 325 MG RE SUPP: 325.0000 mg | RECTAL | Status: DC | PRN

## 2019-01-21 MED ORDER — MORPHINE SULFATE (PF) 2 MG/ML IV SOLN: 2.0000 mg | INTRAVENOUS | Status: DC | PRN

## 2019-01-21 MED ORDER — ASPIRIN EC 81 MG PO TBEC
81.0000 mg | DELAYED_RELEASE_TABLET | Freq: Every day | ORAL | Status: DC
  Administered 2019-01-22: 81 mg via ORAL
  Filled 2019-01-21: qty 1

## 2019-01-21 MED ORDER — GUAIFENESIN-DM 100-10 MG/5ML PO SYRP: 15.0000 mL | ORAL_SOLUTION | ORAL | Status: DC | PRN

## 2019-01-21 MED ORDER — LABETALOL HCL 5 MG/ML IV SOLN
10.0000 mg | INTRAVENOUS | Status: DC | PRN
  Filled 2019-01-21: qty 4

## 2019-01-21 MED ORDER — 0.9 % SODIUM CHLORIDE (POUR BTL) OPTIME
TOPICAL | Status: DC | PRN
  Administered 2019-01-21: 1000 mL

## 2019-01-21 MED ORDER — FLUTICASONE PROPIONATE 50 MCG/ACT NA SUSP: 2.0000 | Freq: Every day | NASAL | Status: DC | PRN

## 2019-01-21 MED ORDER — LIDOCAINE HCL (CARDIAC) PF 100 MG/5ML IV SOSY
PREFILLED_SYRINGE | INTRAVENOUS | Status: DC | PRN
  Administered 2019-01-21: 40 mg via INTRAVENOUS

## 2019-01-21 MED ORDER — FENTANYL CITRATE (PF) 100 MCG/2ML IJ SOLN
INTRAMUSCULAR | Status: DC | PRN
  Administered 2019-01-21: 75 ug via INTRAVENOUS
  Administered 2019-01-21: 25 ug via INTRAVENOUS

## 2019-01-21 MED ORDER — METOPROLOL TARTRATE 5 MG/5ML IV SOLN: 2.0000 mg | INTRAVENOUS | Status: DC | PRN

## 2019-01-21 MED ORDER — PHENYLEPHRINE HCL-NACL 10-0.9 MG/250ML-% IV SOLN
INTRAVENOUS | Status: DC | PRN
  Administered 2019-01-21: 20 ug/min via INTRAVENOUS

## 2019-01-21 MED ORDER — METOPROLOL SUCCINATE ER 25 MG PO TB24
12.5000 mg | ORAL_TABLET | Freq: Every day | ORAL | Status: DC
  Filled 2019-01-21: qty 1

## 2019-01-21 MED ORDER — DEXAMETHASONE SODIUM PHOSPHATE 10 MG/ML IJ SOLN
INTRAMUSCULAR | Status: DC | PRN
  Administered 2019-01-21: 5 mg via INTRAVENOUS

## 2019-01-21 SURGICAL SUPPLY — 63 items
BLADE CLIPPER SURG (BLADE) ×2 IMPLANT
CANISTER SUCT 3000ML PPV (MISCELLANEOUS) ×2 IMPLANT
CATH BEACON 5 .035 40 KMP TP (CATHETERS) IMPLANT
CATH BEACON 5 .038 40 KMP TP (CATHETERS)
CATH BEACON 5.038 65CM KMP-01 (CATHETERS) ×2 IMPLANT
CATH OMNI FLUSH .035X70CM (CATHETERS) ×2 IMPLANT
COVER WAND RF STERILE (DRAPES) ×2 IMPLANT
DERMABOND ADVANCED (GAUZE/BANDAGES/DRESSINGS) ×2
DERMABOND ADVANCED .7 DNX12 (GAUZE/BANDAGES/DRESSINGS) ×1 IMPLANT
DEVICE CLOSURE PERCLS PRGLD 6F (VASCULAR PRODUCTS) IMPLANT
DEVICE TORQUE KENDALL .025-038 (MISCELLANEOUS) IMPLANT
DRSG TEGADERM 2-3/8X2-3/4 SM (GAUZE/BANDAGES/DRESSINGS) ×4 IMPLANT
DRYSEAL FLEXSHEATH 12FR 33CM (SHEATH) ×1
DRYSEAL FLEXSHEATH 18FR 33CM (SHEATH) ×1
ELECT REM PT RETURN 9FT ADLT (ELECTROSURGICAL) ×4
ELECTRODE REM PT RTRN 9FT ADLT (ELECTROSURGICAL) ×2 IMPLANT
EXCLUDER AORTIC EXTEND 32X4.5 (Vascular Products) ×1 IMPLANT
EXCLUDER TNK LEG 31MX14X15 (Endovascular Graft) IMPLANT
EXCLUDER TRUNK LEG 31MX14X15 (Endovascular Graft) ×2 IMPLANT
GAUZE SPONGE 2X2 8PLY STRL LF (GAUZE/BANDAGES/DRESSINGS) ×2 IMPLANT
GAUZE SPONGE 4X4 16PLY XRAY LF (GAUZE/BANDAGES/DRESSINGS) ×1 IMPLANT
GLIDEWIRE ADV .035X260CM (WIRE) ×1 IMPLANT
GLOVE BIO SURGEON STRL SZ 6.5 (GLOVE) ×1 IMPLANT
GLOVE BIO SURGEON STRL SZ7.5 (GLOVE) ×2 IMPLANT
GLOVE BIOGEL PI IND STRL 6.5 (GLOVE) IMPLANT
GLOVE BIOGEL PI INDICATOR 6.5 (GLOVE) ×5
GLOVE SURG SS PI 6.0 STRL IVOR (GLOVE) ×1 IMPLANT
GOWN STRL REUS W/ TWL LRG LVL3 (GOWN DISPOSABLE) ×2 IMPLANT
GOWN STRL REUS W/ TWL XL LVL3 (GOWN DISPOSABLE) ×2 IMPLANT
GOWN STRL REUS W/TWL LRG LVL3 (GOWN DISPOSABLE) ×3
GOWN STRL REUS W/TWL XL LVL3 (GOWN DISPOSABLE) ×2
GRAFT BALLN CATH 65CM (STENTS) ×1 IMPLANT
GUIDEWIRE ANGLED .035X150CM (WIRE) IMPLANT
KIT BASIN OR (CUSTOM PROCEDURE TRAY) ×2 IMPLANT
KIT TURNOVER KIT B (KITS) ×2 IMPLANT
LEG CONTRALATERAL 16X14.5X12 (Vascular Products) ×1 IMPLANT
NDL PERC 18GX7CM (NEEDLE) IMPLANT
NEEDLE PERC 18GX7CM (NEEDLE) IMPLANT
NS IRRIG 1000ML POUR BTL (IV SOLUTION) ×2 IMPLANT
PACK ENDOVASCULAR (PACKS) ×2 IMPLANT
PAD ARMBOARD 7.5X6 YLW CONV (MISCELLANEOUS) ×4 IMPLANT
PENCIL BUTTON HOLSTER BLD 10FT (ELECTRODE) ×1 IMPLANT
PERCLOSE PROGLIDE 6F (VASCULAR PRODUCTS) ×10
SET MICROPUNCTURE 5F STIFF (MISCELLANEOUS) ×2 IMPLANT
SHEATH BRITE TIP 8FR 23CM (SHEATH) ×2 IMPLANT
SHEATH DRYSEAL FLEX 12FR 33CM (SHEATH) IMPLANT
SHEATH DRYSEAL FLEX 18FR 33CM (SHEATH) IMPLANT
SHEATH PINNACLE 8F 10CM (SHEATH) ×2 IMPLANT
SPONGE GAUZE 2X2 STER 10/PKG (GAUZE/BANDAGES/DRESSINGS) ×2
STENT GRAFT BALLN CATH 65CM (STENTS) ×1
STOPCOCK MORSE 400PSI 3WAY (MISCELLANEOUS) ×2 IMPLANT
SUT MNCRL AB 4-0 PS2 18 (SUTURE) ×4 IMPLANT
SUT PROLENE 5 0 C 1 24 (SUTURE) IMPLANT
SUT VIC AB 2-0 CT1 27 (SUTURE)
SUT VIC AB 2-0 CT1 TAPERPNT 27 (SUTURE) IMPLANT
SUT VIC AB 3-0 SH 27 (SUTURE)
SUT VIC AB 3-0 SH 27X BRD (SUTURE) IMPLANT
SYR 20ML LL LF (SYRINGE) ×2 IMPLANT
TOWEL GREEN STERILE (TOWEL DISPOSABLE) ×2 IMPLANT
TRAY FOLEY MTR SLVR 16FR STAT (SET/KITS/TRAYS/PACK) ×2 IMPLANT
TUBING INJECTOR 48 (MISCELLANEOUS) ×2 IMPLANT
WIRE AMPLATZ SS-J .035X180CM (WIRE) ×3 IMPLANT
WIRE BENTSON .035X145CM (WIRE) ×3 IMPLANT

## 2019-01-21 NOTE — Progress Notes (Signed)
Pt transferred to 4E-06 via bed from PACU. L/R groin c/d/i level 0. Pt given Chg bath. Tele applied, CCMD notified. Pt oriented to call bell, bed and room. Call bell within reach. VSS. Will continue to monitor.  Amanda Cockayne, RN

## 2019-01-21 NOTE — Anesthesia Postprocedure Evaluation (Signed)
Anesthesia Post Note  Patient: Brian [name]  Procedure(s) Performed: ABDOMINAL AORTIC ENDOVASCULAR STENT GRAFT (N/A )     Patient location during evaluation: PACU Anesthesia Type: General Level of consciousness: awake and alert Pain management: pain level controlled Vital Signs Assessment: post-procedure vital signs reviewed and stable Respiratory status: spontaneous breathing, nonlabored ventilation, respiratory function stable and patient connected to nasal cannula oxygen Cardiovascular status: blood pressure returned to baseline and stable Postop Assessment: no apparent nausea or vomiting Anesthetic complications: no    Last Vitals:  Vitals:   01/21/19 1500 01/21/19 1600  BP: (!) 150/71 (!) 133/55  Pulse: (!) 53 (!) 50  Resp: 19 15  Temp:    SpO2: 93% 93%    Last Pain:  Vitals:   01/21/19 1354  TempSrc: Oral  PainSc:                  Marquitta Persichetti COKER

## 2019-01-21 NOTE — Anesthesia Procedure Notes (Signed)
Arterial Line Insertion Start/End12/03/2018 8:15 AM, 01/21/2019 8:25 AM Performed by: Kyung Rudd, CRNA, CRNA  Patient location: Pre-op. Lidocaine 1% used for infiltration and patient sedated Left, radial was placed Catheter size: 20 G Hand hygiene performed  and maximum sterile barriers used   Attempts: 3 Procedure performed without using ultrasound guided technique. Following insertion, dressing applied and Biopatch. Post procedure assessment: normal  Patient tolerated the procedure well with no immediate complications.

## 2019-01-21 NOTE — H&P (Signed)
   History and Physical Update  The patient was interviewed and re-examined.  The patient's previous History and Physical has been reviewed and is unchanged from recent office visit. Plan for EVAR with possible endoanchors and possible snorkel of right renal artery and possible coverage of accessory left renal artery. I have discussed the risks and benefits as well as alternative of no surgery. Dr. Angelena Form is his cardiologist and did not require additional cardiac testing prior to surgery.  Tahani Potier C. Donzetta Matters, MD [facility] [place] Office: [phone] Pager: [phone]   01/21/2019, 8:29 AM

## 2019-01-21 NOTE — Op Note (Signed)
Patient name: Brian [name] MRN: [id number] DOB: 24-Apr-1931 Sex: male  01/21/2019 Pre-operative Diagnosis: Abdominal aortic aneurysm Post-operative diagnosis:  Same Surgeon:  Erlene Quan C. Donzetta Matters, MD Assistant: Ellsworth Lennox, RN Procedure Performed: 1.  Percutaneous bilateral common femoral artery access and closure 2.  Aortobiiliac endograft with main body right 31 x 14 x 15 cm and contralateral left 14 x 12 cm with proximal cuff 32 x 4.5 cm.   Indications: 85 year old male with history of abdominal aortic aneurysm that we have followed.  It now measures greater than 5.5 cm.  He is indicated for repair with possible adjunctive procedures of right renal artery snorkel, possible covering of the left renal accessory artery, possible endoanchors.  Findings: There did appear to be a neck at least 1 cm by angiography.  After placement of aortobiiliac endograft initially there was a small type I gutter on the right lateral aspect of the graft.  We extended with a cuff just to the distal aspect of our inferior left renal artery.  At completion we had no endoleak's notable except possibly 1 left-sided lumbar artery.  All 3 renal arteries including both the left and the right renal artery filled.  There was good seal distally.   Procedure:  The patient was identified in the holding area and taken to room where is placed by operative when general anesthesia induced.  He was sterilely prepped and draped in the left chest abdomen bilateral groins in usual fashion antibiotics were administered timeout was called.  Ultrasound was used to identify first the right common femoral artery this was cannulated with micropuncture needle followed the wire and sheath.  He was dilated with 8 [place] sheath 2 proglide devices were deployed and a [place] sheath was placed over Bentson wire.  Similarly on the left side, femoral artery was noted to be patent cannulated micropuncture needle followed by wire sheath.  Bentson wire was  placed followed by 8 [place] dilator 2 proglide devices were deployed and a longer 8 French sheath was placed.  We then placed the stiff wire into the aorta.  11 French sheath was placed on the right under fluoroscopic guidance.  Patient was fully heparinized at this time.  We then placed a long 12 French sheath from the left side over stiff wire.  The main body was brought through the right side with correct orientation to the level of the distal L1.  An Omni catheter was placed on the left side aortogram was performed with 15 degrees of cranial.  We identified the lowest left renal artery and deployed our graft.  We took another angiogram demonstrated patency of all renal arteries.  We then cannulated the gate from the left side confirmed access by twirling a pigtail.  We performed retrograde left iliac angiogram identified the occluded left hypogastric.  We placed an extension limb 14 x 12 from the left side.  On the right side we performed retrograde angiogram and fully deployed our graft.  We then ballooned out the entirety of the graft from both sides.  Completion angiogram demonstrated possible bladder leak on the right side.  We did have approximately 4 5 mm today and place a cuff higher to the lowest left renal artery.  A 32 x 4.5 cm cuff was then placed in balloon.  There was no further completion angiography.  We demonstrated no endoleak's.  With this we then removed all of our catheters.  We first percutaneously closed the left side followed by the right  side.  There were good signals at the feet and feet are warm well perfused.  50 mg of protamine was administered.  The groin incisions were closed with 4-0 Monocryl and dermabond was placed above that.  He was awakened from anesthesia having tolerated procedure well without any complication.  All counts were correct at completion.  EBL: 100cc  Brandon C. Donzetta Matters, MD [facility] [place] Office: [phone] Pager: [phone]

## 2019-01-21 NOTE — Discharge Instructions (Signed)
  [facility] [place]   Discharge Instructions  Endovascular Aortic Aneurysm Repair  Please refer to the following instructions for your post-procedure care. Your surgeon or Physician Assistant will discuss any changes with you.  Activity  You are encouraged to walk as much as you can. You can slowly return to normal activities but must avoid strenuous activity and heavy lifting until your doctor tells you it's OK. Avoid activities such as vacuuming or swinging a gold club. It is normal to feel tired for several weeks after your surgery. Do not drive until your doctor gives the OK and you are no longer taking prescription pain medications. It is also normal to have difficulty with sleep habits, eating, and bowel movements after surgery. These will go away with time.  Bathing/Showering  Shower daily after you go home.  Do not soak in a bathtub, hot tub, or swim until the incision heals completely.  If you have incisions in your groin, wash the groin wounds with soap and water daily and pat dry. (No tub bath-only shower)  Then put a dry gauze or washcloth there to keep this area dry to help prevent wound infection daily and as needed.  Do not use Vaseline or neosporin on your incisions.  Only use soap and water on your incisions and then protect and keep dry.  Incision Care  Shower every day. Clean your incision with mild soap and water. Pat the area dry with a clean towel. You do not need a bandage unless otherwise instructed. Do not apply any ointments or creams to your incision. If you clothing is irritating, you may cover your incision with a dry gauze pad.  Diet  Resume your normal diet. There are no special food restrictions following this procedure. A low fat/low cholesterol diet is recommended for all patients with vascular disease. In order to heal from your surgery, it is CRITICAL to get adequate nutrition. Your body requires vitamins, minerals, and protein.  Vegetables are the best source of vitamins and minerals. Vegetables also provide the perfect balance of protein. Processed food has little nutritional value, so try to avoid this.  Medications  Resume taking all of your medications unless your doctor or nurse practitioner tells you not to. If your incision is causing pain, you may take over-the-counter pain relievers such as acetaminophen (Tylenol). If you were prescribed a stronger pain medication, please be aware these medications can cause nausea and constipation. Prevent nausea by taking the medication with a snack or meal. Avoid constipation by drinking plenty of fluids and eating foods with a high amount of fiber, such as fruits, vegetables, and grains.  Do not take Tylenol if you are taking prescription pain medications.   Follow up  Our office will schedule a follow-up appointment with a CT scan 3-4 weeks after your surgery.  Please call us immediately for any of the following conditions  Severe or worsening pain in your legs or feet or in your abdomen back or chest. Increased pain, redness, drainage (pus) from your incision site. Increased abdominal pain, bloating, nausea, vomiting or persistent diarrhea. Fever of 101 degrees or higher. Swelling in your leg (s),  Reduce your risk of vascular disease  Stop smoking. If you would like help call [facility] at 1-800-QUIT-NOW (1-800-784-8669) or [place] at [phone]. Manage your cholesterol Maintain a desired weight Control your diabetes Keep your blood pressure down  If you have questions, please call the office at [phone].  

## 2019-01-21 NOTE — Transfer of Care (Signed)
Immediate Anesthesia Transfer of Care Note  Patient: Brian [name]  Procedure(s) Performed: ABDOMINAL AORTIC ENDOVASCULAR STENT GRAFT (N/A )  Patient Location: PACU  Anesthesia Type:General  Level of Consciousness: awake, alert  and oriented  Airway & Oxygen Therapy: Patient Spontanous Breathing and Patient connected to nasal cannula oxygen  Post-op Assessment: Report given to RN, Post -op Vital signs reviewed and stable and Patient moving all extremities  Post vital signs: Reviewed and stable  Last Vitals:  Vitals Value Taken Time  BP    Temp    Pulse    Resp    SpO2      Last Pain:  Vitals:   01/21/19 0654  PainSc: 0-No pain      Patients Stated Pain Goal: 3 (22/84/06 9861)  Complications: No apparent anesthesia complications

## 2019-01-21 NOTE — Anesthesia Postprocedure Evaluation (Signed)
Anesthesia Post Note  Patient: Brian [name]  Procedure(s) Performed: ABDOMINAL AORTIC ENDOVASCULAR STENT GRAFT (N/A )     Patient location during evaluation: PACU Anesthesia Type: General Level of consciousness: awake and alert Pain management: pain level controlled Vital Signs Assessment: post-procedure vital signs reviewed and stable Respiratory status: spontaneous breathing, nonlabored ventilation, respiratory function stable and patient connected to nasal cannula oxygen Cardiovascular status: blood pressure returned to baseline and stable Postop Assessment: no apparent nausea or vomiting Anesthetic complications: no    Last Vitals:  Vitals:   01/21/19 0654 01/21/19 1105  BP: (!) 183/67 (!) 142/64  Pulse: 67   Resp: 18   Temp: 36.4 C (!) 35.3 C  SpO2: 99%     Last Pain:  Vitals:   01/21/19 1105  PainSc: 0-No pain                 Concepcion Kirkpatrick COKER

## 2019-01-21 NOTE — Anesthesia Procedure Notes (Signed)
Procedure Name: Intubation Date/Time: 01/21/2019 9:03 AM Performed by: Maite Burlison T, CRNA Pre-anesthesia Checklist: Emergency Drugs available, Patient identified, Suction available and Patient being monitored Patient Re-evaluated:Patient Re-evaluated prior to induction Oxygen Delivery Method: Circle system utilized Preoxygenation: Pre-oxygenation with 100% oxygen Induction Type: IV induction Ventilation: Mask ventilation without difficulty Laryngoscope Size: Miller and 3 Grade View: Grade I Tube type: Oral Tube size: 7.5 mm Number of attempts: 1 Airway Equipment and Method: Patient positioned with wedge pillow and Stylet Placement Confirmation: ETT inserted through vocal cords under direct vision,  positive ETCO2 and breath sounds checked- equal and bilateral Secured at: 23 cm Tube secured with: Tape Dental Injury: Teeth and Oropharynx as per pre-operative assessment

## 2019-01-22 ENCOUNTER — Encounter (HOSPITAL_COMMUNITY): Payer: Self-pay | Admitting: Vascular Surgery

## 2019-01-22 ENCOUNTER — Telehealth: Payer: Self-pay | Admitting: *Deleted

## 2019-01-22 LAB — BASIC METABOLIC PANEL
Anion gap: 7 (ref 5–15)
BUN: 22 mg/dL (ref 8–23)
CO2: 23 mmol/L (ref 22–32)
Calcium: 8.3 mg/dL — ABNORMAL LOW (ref 8.9–10.3)
Chloride: 106 mmol/L (ref 98–111)
Creatinine, Ser: 1.56 mg/dL — ABNORMAL HIGH (ref 0.61–1.24)
GFR calc Af Amer: 47 mL/min — ABNORMAL LOW (ref >60)
GFR calc non Af Amer: 40 mL/min — ABNORMAL LOW (ref >60)
Glucose, Bld: 147 mg/dL — ABNORMAL HIGH (ref 70–99)
Potassium: 5.5 mmol/L — ABNORMAL HIGH (ref 3.5–5.1)
Sodium: 136 mmol/L (ref 135–145)

## 2019-01-22 LAB — CBC
HCT: 34.3 % — ABNORMAL LOW (ref 39.0–52.0)
Hemoglobin: 11.2 g/dL — ABNORMAL LOW (ref 13.0–17.0)
MCH: 30.9 pg (ref 26.0–34.0)
MCHC: 32.7 g/dL (ref 30.0–36.0)
MCV: 94.5 fL (ref 80.0–100.0)
Platelets: 165 10*3/uL (ref 150–400)
RBC: 3.63 MIL/uL — ABNORMAL LOW (ref 4.22–5.81)
RDW: 12.2 % (ref 11.5–15.5)
WBC: 9.3 10*3/uL (ref 4.0–10.5)
nRBC: 0 % (ref 0.0–0.2)

## 2019-01-22 MED ORDER — OXYCODONE-ACETAMINOPHEN 5-325 MG PO TABS: 1.0000 | ORAL_TABLET | Freq: Four times a day (QID) | ORAL | 0 refills | Status: DC | PRN

## 2019-01-22 NOTE — Progress Notes (Signed)
Patient walked 500 ft. No assistance needed. Patient tolerated it well. Patient's O2 sats 88% RA after walk. Patient placed on 1 L O2. Will continue to monitor

## 2019-01-22 NOTE — Telephone Encounter (Signed)
Pt was on TCM report admitted 01/21/19 for AAA WITHOUT RUPTURE. Pt underwent a Endovascular repair of abdominal aortic aneurysm with additional proximal cuff by Dr. Donzetta Matters. Pt D/C 01/22/19, and will follow-up w/specialist in 3 wks.Marland KitchenJohny Chess

## 2019-01-22 NOTE — Discharge Summary (Addendum)
EVAR Discharge Summary   Brian [name] 17-Jul-1931 85 y.o. male  MRN: [id number]  Admission Date: 01/21/2019  Discharge Date: 01/22/19  Physician: Thomes Lolling*  Admission Diagnosis: AAA WITHOUT RUPTURE  Discharge Day services:    see progress note 01/22/19 Physical Exam: Vitals:   01/22/19 0500 01/22/19 0805  BP: (!) 120/55 (!) 117/47  Pulse: (!) 53 (!) 56  Resp: 16 (!) 21  Temp: 97.7 F (36.5 C) 97.6 F (36.4 C)  SpO2: 94% 93%    Hospital Course:  The patient was admitted to the hospital and taken to the operating room on 01/21/2019 and underwent:  Endovascular repair of abdominal aortic aneurysm with additional proximal cuff by Dr. Donzetta Matters on 01/21/2019.  The pt tolerated the procedure well and was transported to the PACU in good condition.   POD #1 there is no change on abdominal exam, patient has good urine output despite small increase in creatinine, and patient is feeling ready for discharge home.  He will follow-up in office in about 4 weeks with a CT abdomen pelvis per protocol.  He will be prescribed 1 to 2 days of narcotic pain medication for continued postoperative pain control.  He will be discharged home this morning in stable condition. CBC    Component Value Date/Time   WBC 9.3 01/22/2019 0500   RBC 3.63 (L) 01/22/2019 0500   HGB 11.2 (L) 01/22/2019 0500   HCT 34.3 (L) 01/22/2019 0500   PLT 165 01/22/2019 0500   MCV 94.5 01/22/2019 0500   MCH 30.9 01/22/2019 0500   MCHC 32.7 01/22/2019 0500   RDW 12.2 01/22/2019 0500   LYMPHSABS 1.7 09/30/2018 0947   MONOABS 0.6 09/30/2018 0947   EOSABS 0.3 09/30/2018 0947   BASOSABS 0.1 09/30/2018 0947    BMET    Component Value Date/Time   NA 136 01/22/2019 0500   NA 143 03/16/2016   K 5.5 (H) 01/22/2019 0500   CL 106 01/22/2019 0500   CO2 23 01/22/2019 0500   GLUCOSE 147 (H) 01/22/2019 0500   BUN 22 01/22/2019 0500   BUN 23 (A) 03/19/2018   CREATININE 1.56 (H) 01/22/2019 0500   CREATININE 1.36 (H) 12/23/2012 1113   CALCIUM 8.3 (L) 01/22/2019 0500   CALCIUM 9.0 03/16/2016   GFRNONAA 40 (L) 01/22/2019 0500   GFRAA 47 (L) 01/22/2019 0500         Discharge Diagnosis:  AAA WITHOUT RUPTURE  Secondary Diagnosis: Patient Active Problem List   Diagnosis Date Noted  . AAA (abdominal aortic aneurysm) ([place]) 01/21/2019  . Nerve pain 10/15/2018  . Low vitamin B12 level 09/30/2018  . LUQ pain 09/30/2018  . Arthritis of carpometacarpal (CMC) joint of thumb 09/30/2018  . Chest pain 01/29/2018  . Decreased hearing of right ear 11/23/2017  . Vertigo 07/17/2016  . Nocturnal leg cramps 09/20/2015  . COPD (chronic obstructive pulmonary disease) ([place]) 05/31/2015  . CKD (chronic kidney disease) stage 3, GFR 30-59 ml/min 05/31/2015  . Diabetes ([place]) 04/26/2015  . AAA (abdominal aortic aneurysm) without rupture ([place]) 01/18/2015  . PAD (peripheral artery disease) ([place]) 01/18/2015  . Hereditary and idiopathic peripheral neuropathy 10/29/2012  . Hypertension 05/10/2011  . Bradycardia 05/10/2011  . CAD (coronary artery disease) 09/05/2010  . TINNITUS 04/04/2010  . CORONARY ARTERY BYPASS GRAFT, HX OF 03/31/2010  . Disturbance in sleep behavior 12/28/2008  . Cervicalgia 07/26/2008  . POLYP, COLON 07/16/2007  . Hyperlipidemia 09/24/2006  . History of gout 09/23/2006   Past Medical History:  Diagnosis Date  .  AAA (abdominal aortic aneurysm) ([place])   . Arthritis   . Asthma 1976  . Bronchiolitis May 2016  . CAD (coronary artery disease)    NSTEMI 02/2010;  s/p CABG 1/12 (L-LAD, SOM1, S-PDA); echo in 08/2010: Mild LVH, EF 40%, grade 2 diastolic dysfunction, mild MR, mild LAE, mildly reduced RV function, mild RAE, PASP 42  . Carpal tunnel syndrome of left wrist   . Colon polyps   . COPD (chronic obstructive pulmonary disease) ([place])   . Diabetes mellitus 2000   Type II, controlls with diet  . Dyspnea   . ED (erectile dysfunction)   . Gout   . HTN (hypertension)   .  Hyperlipidemia   . Inguinal hernia    Right  . Macular degeneration   . Myocardial infarction ([place]) 1 -13-2012  . Neuropathy      Allergies as of 01/22/2019      Reactions   Meperidine Hcl    Tongue swelling Because of a history of documented adverse serious drug reaction;Medi Alert bracelet  is recommended   Zolpidem Tartrate Other (See Comments)   Nightmares      Medication List    TAKE these medications   ACCU-CHEK COMPACT STRIPS test strip Generic drug: glucose blood TEST BLOOD SUGAR ONCE DAILY   Adult Aspirin Low Strength 81 MG EC tablet Generic drug: Aspirin Take 81 mg by mouth daily.   Anoro Ellipta 62.5-25 MCG/INH Aepb Generic drug: umeclidinium-vilanterol USE 1 INHALATION DAILY What changed: See the new instructions.   cholecalciferol 1000 units tablet Commonly known as: VITAMIN D Take 3,000 Units by mouth daily.   enalapril 10 MG tablet Commonly known as: VASOTEC TAKE 1 TABLET TWICE A DAY   fluticasone 50 MCG/ACT nasal spray Commonly known as: FLONASE Place 2 sprays into both nostrils daily as needed for allergies or rhinitis.   gabapentin 100 MG capsule Commonly known as: NEURONTIN Take 1-2 capsules (100-200 mg total) by mouth at bedtime. What changed: how much to take   Magnesium Oxide 420 MG Tabs Take 420 mg by mouth daily.   MULTIVITAL PO Take 1 tablet by mouth daily.   oxyCODONE-acetaminophen 5-325 MG tablet Commonly known as: PERCOCET/ROXICET Take 1 tablet by mouth every 6 (six) hours as needed for moderate pain.   simvastatin 40 MG tablet Commonly known as: ZOCOR TAKE 1 TABLET DAILY What changed: when to take this   Systane 0.4-0.3 % Soln Generic drug: Polyethyl Glycol-Propyl Glycol Place 1 drop into both eyes daily.   temazepam 7.5 MG capsule Commonly known as: RESTORIL Take 1 capsule (7.5 mg total) by mouth at bedtime as needed.   Toprol XL 25 MG 24 hr tablet Generic drug: metoprolol succinate TAKE ONE-HALF (1/2) TABLET  DAILY What changed: how much to take   triamcinolone cream 0.1 % Commonly known as: KENALOG Apply 1 application topically daily as needed ([place]).       Discharge Instructions:   [facility]  Discharge Instructions Endovascular Aortic Aneurysm Repair  Please refer to the following instructions for your post-procedure care. Your surgeon or Physician Assistant will discuss any changes with you.  Activity  You are encouraged to walk as much as you can. You can slowly return to normal activities but must avoid strenuous activity and heavy lifting until your doctor tells you it's OK. Avoid activities such as vacuuming or swinging a gold club. It is normal to feel tired for several weeks after your surgery. Do not drive until your doctor  gives the OK and you are no longer taking prescription pain medications. It is also normal to have difficulty with sleep habits, eating, and bowel movements after surgery. These will go away with time.  Bathing/Showering  You may shower after you go home. If you have an incision, do not soak in a bathtub, hot tub, or swim until the incision heals completely.  Incision Care  Shower every day. Clean your incision with mild soap and water. Pat the area dry with a clean towel. You do not need a bandage unless otherwise instructed. Do not apply any ointments or creams to your incision. If you clothing is irritating, you may cover your incision with a dry gauze pad.  Diet  Resume your normal diet. There are no special food restrictions following this procedure. A low fat/low cholesterol diet is recommended for all patients with vascular disease. In order to heal from your surgery, it is CRITICAL to get adequate nutrition. Your body requires vitamins, minerals, and protein. Vegetables are the best source of vitamins and minerals. Vegetables also provide the perfect balance of protein. Processed food has little nutritional value, so  try to avoid this.  Medications  Resume taking all of your medications unless your doctor or Physician Assistnat tells you not to. If your incision is causing pain, you may take over-the-counter pain relievers such as acetaminophen (Tylenol). If you were prescribed a stronger pain medication, please be aware these medications can cause nausea and constipation. Prevent nausea by taking the medication with a snack or meal. Avoid constipation by drinking plenty of fluids and eating foods with a high amount of fiber, such as fruits, vegetables, and grains. Do not take Tylenol if you are taking prescription pain medications.   Follow up  [place] office will schedule a follow-up appointment with a C.T. scan 3-4 weeks after your surgery.  Please call us immediately for any of the following conditions  Severe or worsening pain in your legs or feet or in your abdomen back or chest. Increased pain, redness, drainage (pus) from your incision sit. Increased abdominal pain, bloating, nausea, vomiting or persistent diarrhea. Fever of 101 degrees or higher. Swelling in your leg (s),  Reduce your risk of vascular disease  .Stop smoking. If you would like help call [facility] at 1-800-QUIT-NOW 774-309-7830) or [place] at 717-707-1819. .Manage your cholesterol .Maintain a desired weight .Control your diabetes .Keep your blood pressure down  If you have questions, please call the office at [phone].   Disposition: home  Patient's condition: is Good  Follow up: 1. Dr. Donzetta Matters in 4 weeks with CTA protocol   Dagoberto Ligas, PA-C [facility] [phone] 01/22/2019  8:13 AM   - For VQI Registry use - Post-op:  Time to Extubation: [x]  In OR, [ ]  < 12 hrs, [ ]  12-24 hrs, [ ]  >=24 hrs Vasopressors Req. Post-op: No MI: No., [ ]  Troponin only, [ ]  EKG or Clinical New Arrhythmia: No CHF: No ICU Stay: 0 days Transfusion: No      Complications: Resp failure: No., [ ]   Pneumonia, [ ]  Ventilator Chg in renal function: No., [ ]  Inc. Cr > 0.5, [ ]  Temp. Dialysis,  [ ]  Permanent dialysis Leg ischemia: No., no Surgery needed, [ ]  Yes, Surgery needed,  [ ]  Amputation Bowel ischemia: No., [ ]  Medical Rx, [ ]  Surgical Rx Wound complication: No., [ ]  Superficial separation/infection, [ ]  Return to OR Return to OR: No  Return to OR for bleeding:  No Stroke: No., [ ]  Minor, [ ]  Major  Discharge medications: Statin use:  Yes  ASA use:  Yes  Plavix use:  No  Beta blocker use:  Yes  ARB use:  No ACEI use:  Yes CCB use:  No

## 2019-01-22 NOTE — Plan of Care (Signed)
  Problem: Clinical Measurements: Goal: Will remain free from infection Outcome: Progressing Goal: Respiratory complications will improve Outcome: Progressing Goal: Cardiovascular complication will be avoided Outcome: Progressing   Problem: Activity: Goal: Risk for activity intolerance will decrease Outcome: Progressing   

## 2019-01-22 NOTE — Progress Notes (Signed)
Discharge AVS meds take and those due reviewed with pt. Follow up appointments and when to call MD reviewed. All questions and concerns addressed. No further questions at this time. D/c IV and TELE, CCMD notified. D/C home per orders. Brought down via wheelchair with staff and all belongings.  Amanda Cockayne, RN

## 2019-01-22 NOTE — Progress Notes (Addendum)
  Progress Note    01/22/2019 7:34 AM 1 Day Post-Op  Subjective:  No abd or back pain.  Making good urine.  No rest pain in feet.  Feeling ready for discharge home.   Vitals:   01/22/19 0103 01/22/19 0500  BP: (!) 134/54 (!) 120/55  Pulse: (!) 56 (!) 53  Resp: 16 16  Temp: 97.7 F (36.5 C) 97.7 F (36.5 C)  SpO2:  94%   Physical Exam: Lungs:  Non labored Incisions:  Groin incisions soft, c/d/i Extremities:  Brisk pedal doppler signals bilaterally Abdomen:  Soft, NT, ND Neurologic: A&O  CBC    Component Value Date/Time   WBC 9.3 01/22/2019 0500   RBC 3.63 (L) 01/22/2019 0500   HGB 11.2 (L) 01/22/2019 0500   HCT 34.3 (L) 01/22/2019 0500   PLT 165 01/22/2019 0500   MCV 94.5 01/22/2019 0500   MCH 30.9 01/22/2019 0500   MCHC 32.7 01/22/2019 0500   RDW 12.2 01/22/2019 0500   LYMPHSABS 1.7 09/30/2018 0947   MONOABS 0.6 09/30/2018 0947   EOSABS 0.3 09/30/2018 0947   BASOSABS 0.1 09/30/2018 0947    BMET    Component Value Date/Time   NA 136 01/22/2019 0500   NA 143 03/16/2016   K 5.5 (H) 01/22/2019 0500   CL 106 01/22/2019 0500   CO2 23 01/22/2019 0500   GLUCOSE 147 (H) 01/22/2019 0500   BUN 22 01/22/2019 0500   BUN 23 (A) 03/19/2018   CREATININE 1.56 (H) 01/22/2019 0500   CREATININE 1.36 (H) 12/23/2012 1113   CALCIUM 8.3 (L) 01/22/2019 0500   CALCIUM 9.0 03/16/2016   GFRNONAA 40 (L) 01/22/2019 0500   GFRAA 47 (L) 01/22/2019 0500    INR    Component Value Date/Time   INR 1.2 01/21/2019 1125     Intake/Output Summary (Last 24 hours) at 01/22/2019 0734 Last data filed at 01/22/2019 0533 Gross per 24 hour  Intake 1812.96 ml  Output 1200 ml  Net 612.96 ml     Assessment/Plan:  85 y.o. male is s/p EVAR 1 Day Post-Op   No new abd/back pain; BLE warm and well perfused Slight Cr increase but not far from baseline and making good urine Ok for discharge home this morning Follow up in 4 weeks with CT abd/pelvis   Dagoberto Ligas, PA-C [facility] [phone] 01/22/2019 7:34 AM  I have independently interviewed and examined patient and agree with PA assessment and plan above.  He will resume home aspirin and zocor. Plan for f/u in 4 weeks with CT.  Marzell Isakson C. Donzetta Matters, MD [facility] [place] Office: [phone] Pager: [phone]

## 2019-02-03 ENCOUNTER — Other Ambulatory Visit: Payer: Self-pay | Admitting: Vascular Surgery

## 2019-02-03 DIAGNOSIS — I714 Abdominal aortic aneurysm, without rupture, unspecified: Secondary | ICD-10-CM

## 2019-02-24 ENCOUNTER — Ambulatory Visit
Admission: RE | Admit: 2019-02-24 | Discharge: 2019-02-24 | Disposition: A | Discharge status: Home / Self Care | Payer: Medicare Other | Source: Ambulatory Visit | Attending: Vascular Surgery | Admitting: Vascular Surgery

## 2019-02-24 DIAGNOSIS — I714 Abdominal aortic aneurysm, without rupture, unspecified: Secondary | ICD-10-CM

## 2019-02-24 MED ORDER — IOPAMIDOL (ISOVUE-370) INJECTION 76%
75.0000 mL | Freq: Once | INTRAVENOUS | Status: AC | PRN
  Administered 2019-02-24: 75 mL via INTRAVENOUS

## 2019-02-26 ENCOUNTER — Telehealth (HOSPITAL_COMMUNITY): Payer: Self-pay

## 2019-02-26 NOTE — Telephone Encounter (Signed)

## 2019-02-27 ENCOUNTER — Ambulatory Visit (INDEPENDENT_AMBULATORY_CARE_PROVIDER_SITE_OTHER): Payer: Medicare Other | Admitting: Vascular Surgery

## 2019-02-27 ENCOUNTER — Other Ambulatory Visit: Payer: Self-pay

## 2019-02-27 ENCOUNTER — Encounter: Payer: Self-pay | Admitting: Vascular Surgery

## 2019-02-27 VITALS — BP 145/61 | HR 66 | Temp 97.9°F | Resp 20 | Ht 71.0 in | Wt 164.5 lb

## 2019-02-27 DIAGNOSIS — I714 Abdominal aortic aneurysm, without rupture, unspecified: Secondary | ICD-10-CM

## 2019-02-27 NOTE — Progress Notes (Signed)
    Subjective:     Patient ID: Brian [name], male   DOB: Mar 19, 1931, 86 y.o.   MRN: [id number]  HPI 86 year old male with history of 5.5 cm aneurysm.  He recently underwent endovascular aneurysm repair.  He has done very well.  He has no complaints today.   Review of Systems Denies complaints    Objective:   Physical Exam  Vitals:   02/27/19 1436  BP: (!) 145/61  Pulse: 66  Resp: 20  Temp: 97.9 F (36.6 C)  SpO2: 97%   Awake alert oriented Nonlabored respirations Has been soft nontender Both groins have healed well palpable femoral pulses bilaterally Left popliteal pulses palpable does not feel enlarged right is nonpalpable   CT angio IMPRESSION: Interval endovascular repair of infrarenal abdominal aortic aneurysm with infrarenal fixation. There has been positive aortic remodeling with decreased size of the excluded aneurysm sac, and no evidence of endoleak.  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  Bilateral renal arterial disease with no high-grade stenosis or occlusion, including irregular plaque within the mid segment of the right main renal artery.      Assessment:     86 year old male status post endovascular aneurysm repair.  CT scan results reviewed with patient today.    Plan:     Follow-up 1 year with aortic duplex  Brian [name] C. Donzetta Matters, MD [facility] [place] Office: [phone] Pager: [phone]

## 2019-02-28 ENCOUNTER — Other Ambulatory Visit: Payer: Self-pay | Admitting: Internal Medicine

## 2019-03-03 ENCOUNTER — Other Ambulatory Visit: Payer: Self-pay | Admitting: *Deleted

## 2019-03-03 DIAGNOSIS — I714 Abdominal aortic aneurysm, without rupture, unspecified: Secondary | ICD-10-CM

## 2019-03-09 ENCOUNTER — Other Ambulatory Visit: Payer: Self-pay | Admitting: Internal Medicine

## 2019-03-15 ENCOUNTER — Ambulatory Visit: Payer: Medicare Other | Attending: Internal Medicine

## 2019-03-15 DIAGNOSIS — Z23 Encounter for immunization: Secondary | ICD-10-CM | POA: Insufficient documentation

## 2019-03-16 NOTE — Progress Notes (Signed)
   Covid-19 Vaccination Clinic  Name:  Brian [name]    MRN: [id number] DOB: 01/12/1932  03/15/2019  [name] was observed post Covid-19 immunization for 15 minutes without incidence. He was provided with Vaccine Information Sheet and instruction to access the V-Safe system.   [name] was instructed to call 911 with any severe reactions post vaccine: Marland Kitchen Difficulty breathing  . Swelling of your face and throat  . A fast heartbeat  . A bad rash all over your body  . Dizziness and weakness    Immunizations Administered    Name Date Dose VIS Date Route   Moderna COVID-19 Vaccine 03/15/2019  4:34 PM 0.5 mL 01/20/2019 Intramuscular   Manufacturer: Levan Hurst   Lot: [id number]   [place]: [id number]      Documented on behalf of: K. Quentin Cornwall

## 2019-03-18 ENCOUNTER — Telehealth: Payer: Self-pay | Admitting: Neurology

## 2019-03-18 LAB — LIPID PANEL
Cholesterol: 138 (ref 0–200)
HDL: 53 (ref 35–70)
LDL Cholesterol: 70
Triglycerides: 73 (ref 40–160)

## 2019-03-18 LAB — CBC AND DIFFERENTIAL
HCT: 39 — AB (ref 41–53)
Hemoglobin: 12.6 — AB (ref 13.5–17.5)
Platelets: 251 (ref 150–399)
WBC: 6.1

## 2019-03-18 LAB — BASIC METABOLIC PANEL
Potassium: 5.1 (ref 3.4–5.3)
Sodium: 142 (ref 137–147)

## 2019-03-18 LAB — CBC: RBC: 4.19 (ref 3.87–5.11)

## 2019-03-18 LAB — HEMOGLOBIN A1C: Hemoglobin A1C: 6.3

## 2019-03-18 NOTE — Telephone Encounter (Signed)
Patient is on the Gabapentin medication and he said he wants to not take it anymore- losing appetite, memory issues, left arm muscle weak. He said all this started since taking the medication. Pharm on file is correct. Thanks!

## 2019-03-18 NOTE — Telephone Encounter (Signed)
Pt informed to discontinue the Gabapentin. He was made aware that it may be causing some of his memory/confusion concerns. Made pt aware that Gabapentin does not usually cause appetite loss or muscle weakness. Informed pt that he may need to speak with PCP in regards to appetite. For the muscle weakness, instructed pt to start wearing wrist splints due to him having carpel tunnel syndrome. Wear them consistently and if he sees no improvement after a few weeks to call us back for a referral to a hand specialist. Surgery was mentioned and pt would like to avoid that.

## 2019-03-18 NOTE — Telephone Encounter (Signed)
OK to stop gabapentin, as he is on a very low dose.  Gabapentin can cause confusion, but typically does not affect appetite or cause weakness.  He does have known carpal tunnel syndrome in the left hand, which can cause weakness. I would encourage him to continue to use wrist splints.  If this is not helping, then the next step is to see a hand specialist for their opinion on whether surgery would be helpful.

## 2019-03-20 ENCOUNTER — Telehealth: Payer: Self-pay

## 2019-03-20 NOTE — Telephone Encounter (Signed)
I reached out to Pulmonary and they will contact pt

## 2019-03-20 NOTE — Telephone Encounter (Signed)
Noted  

## 2019-03-20 NOTE — Telephone Encounter (Signed)
Patient calling and states that he is needing a referral. States that if Dr Quay Burow looks at his CT Scans from 12/29/2018 and looks at the impressions it shows that there are some nodules on his lungs that may be cancerous. Patient unsure who/where a referral was needed, just that he needed one. Please advise.  CB#: [phone]

## 2019-03-20 NOTE — Telephone Encounter (Signed)
Does he need another referral since there was one put in November?

## 2019-03-31 ENCOUNTER — Ambulatory Visit: Payer: Medicare Other | Attending: Internal Medicine

## 2019-03-31 DIAGNOSIS — Z20822 Contact with and (suspected) exposure to covid-19: Secondary | ICD-10-CM

## 2019-04-01 LAB — NOVEL CORONAVIRUS, NAA: SARS-CoV-2, NAA: NOT DETECTED

## 2019-04-02 ENCOUNTER — Ambulatory Visit: Payer: Medicare Other | Admitting: Internal Medicine

## 2019-04-08 ENCOUNTER — Encounter: Payer: Self-pay | Admitting: Internal Medicine

## 2019-04-09 ENCOUNTER — Institutional Professional Consult (permissible substitution): Payer: Medicare Other | Admitting: Emergency Medicine

## 2019-04-12 ENCOUNTER — Ambulatory Visit: Payer: Medicare Other | Attending: Internal Medicine

## 2019-04-12 ENCOUNTER — Other Ambulatory Visit: Payer: Self-pay | Admitting: Internal Medicine

## 2019-04-12 DIAGNOSIS — Z23 Encounter for immunization: Secondary | ICD-10-CM

## 2019-04-12 NOTE — Progress Notes (Signed)
   Covid-19 Vaccination Clinic  Name:  Brian [name]    MRN: [id number] DOB: 22-Apr-1931  04/12/2019  [name] was observed post Covid-19 immunization for 15 minutes without incidence. He was provided with Vaccine Information Sheet and instruction to access the V-Safe system.   [name] was instructed to call 911 with any severe reactions post vaccine: Marland Kitchen Difficulty breathing  . Swelling of your face and throat  . A fast heartbeat  . A bad rash all over your body  . Dizziness and weakness    Immunizations Administered    Name Date Dose VIS Date Route   Moderna COVID-19 Vaccine 04/12/2019  3:03 PM 0.5 mL 01/20/2019 Intramuscular   Manufacturer: Moderna   Lot: [id number]   [place]: [phone]

## 2019-04-16 NOTE — Patient Instructions (Addendum)
  Medications reviewed and updated.  Changes include :   none    Please followup in 6 months   

## 2019-04-16 NOTE — Progress Notes (Signed)
Subjective:    Patient ID: Brian [name], male    DOB: Jul 12, 1931, 86 y.o.   MRN: [id number]  HPI The patient is here for follow up of their chronic medical problems, including CAD, hyperlipidemia, hypertension, diabetes, diabetes, insomnia, CKD, COPD  He is taking all of his medications as prescribed.   His appetite is low.    His BP is better at home - SBP- 141, 148, 143.  His blood pressure tends to be elevated when he comes to the doctors.  He does have an appointment with pulmonary next week.  There is a finding on the CT scan concerning for lung cancer.  Medications and allergies reviewed with patient and updated if appropriate.  Patient Active Problem List   Diagnosis Date Noted  . AAA (abdominal aortic aneurysm) ([place]) 01/21/2019  . Nerve pain 10/15/2018  . Low vitamin B12 level 09/30/2018  . LUQ pain 09/30/2018  . Arthritis of carpometacarpal (CMC) joint of thumb 09/30/2018  . Chest pain 01/29/2018  . Decreased hearing of right ear 11/23/2017  . Vertigo 07/17/2016  . Nocturnal leg cramps 09/20/2015  . COPD (chronic obstructive pulmonary disease) ([place]) 05/31/2015  . CKD (chronic kidney disease) stage 3, GFR 30-59 ml/min 05/31/2015  . Diabetes ([place]) 04/26/2015  . AAA (abdominal aortic aneurysm) without rupture ([place]) 01/18/2015  . PAD (peripheral artery disease) ([place]) 01/18/2015  . Hereditary and idiopathic peripheral neuropathy 10/29/2012  . Hypertension 05/10/2011  . CAD (coronary artery disease) 09/05/2010  . TINNITUS 04/04/2010  . CORONARY ARTERY BYPASS GRAFT, HX OF 03/31/2010  . Disturbance in sleep behavior 12/28/2008  . Cervicalgia 07/26/2008  . POLYP, COLON 07/16/2007  . Hyperlipidemia 09/24/2006  . History of gout 09/23/2006    Current Outpatient Medications on File Prior to Visit  Medication Sig Dispense Refill  . ACCU-CHEK COMPACT STRIPS test strip TEST BLOOD SUGAR ONCE DAILY 100 each 1  . ANORO ELLIPTA 62.5-25 MCG/INH AEPB USE 1 INHALATION DAILY  (Patient taking differently: Inhale 1 puff into the lungs daily. ) 120 each 5  . Aspirin (ADULT ASPIRIN LOW STRENGTH) 81 MG EC tablet Take 81 mg by mouth daily.      . cholecalciferol (VITAMIN D) 1000 UNITS tablet Take 3,000 Units by mouth daily.     . enalapril (VASOTEC) 10 MG tablet TAKE 1 TABLET TWICE A DAY (Patient taking differently: Take 10 mg by mouth 2 (two) times daily. ) 180 tablet 1  . fluticasone (FLONASE) 50 MCG/ACT nasal spray Place 2 sprays into both nostrils daily as needed for allergies or rhinitis.     . Magnesium Oxide 420 MG TABS Take 420 mg by mouth daily.     . metoprolol succinate (TOPROL-XL) 25 MG 24 hr tablet TAKE ONE-HALF (1/2) TABLET DAILY 45 tablet 3  . Multiple Vitamins-Minerals (MULTIVITAL PO) Take 1 tablet by mouth daily.     Vladimir Faster Glycol-Propyl Glycol (SYSTANE) 0.4-0.3 % SOLN Place 1 drop into both eyes daily.    . simvastatin (ZOCOR) 40 MG tablet TAKE 1 TABLET DAILY (Patient taking differently: Take 40 mg by mouth at bedtime. ) 90 tablet 3  . temazepam (RESTORIL) 7.5 MG capsule Take 1 capsule (7.5 mg total) by mouth at bedtime as needed. 90 capsule 1  . triamcinolone cream (KENALOG) 0.1 % Apply 1 application topically daily as needed (Rash).     No current facility-administered medications on file prior to visit.    Past Medical History:  Diagnosis Date  . AAA (abdominal aortic aneurysm) ([place])   .  Arthritis   . Asthma 1976  . Bronchiolitis May 2016  . CAD (coronary artery disease)    NSTEMI 02/2010;  s/p CABG 1/12 (L-LAD, SOM1, S-PDA); echo in 08/2010: Mild LVH, EF 78%, grade 2 diastolic dysfunction, mild MR, mild LAE, mildly reduced RV function, mild RAE, PASP 42  . Carpal tunnel syndrome of left wrist   . Colon polyps   . COPD (chronic obstructive pulmonary disease) ([place])   . Diabetes mellitus 2000   Type II, controlls with diet  . Dyspnea   . ED (erectile dysfunction)   . Gout   . HTN (hypertension)   . Hyperlipidemia   . Inguinal hernia     Right  . Macular degeneration   . Myocardial infarction ([place]) 1 -13-2012  . Neuropathy     Past Surgical History:  Procedure Laterality Date  . 3 vessel CABG  03/03/2010  . ABDOMINAL AORTIC ENDOVASCULAR STENT GRAFT N/A 01/21/2019   Procedure: ABDOMINAL AORTIC ENDOVASCULAR STENT GRAFT;  Surgeon: Waynetta Sandy, MD;  Location: [facility];  Service: Vascular;  Laterality: N/A;  . cataract left eye    . COLONOSCOPY     neg 2009  . COLONOSCOPY  2014   5 mm sessile polyp; AVM  . CORONARY ARTERY BYPASS GRAFT  2012   X3  . ENDOVASCULAR REPAIR/STENT GRAFT  01/21/2019   ABDOMINAL AORTIC ENDOVASCULAR STENT GRAFT (N/A )  . EYE SURGERY     Left cataract  . HERNIA REPAIR    . INCISION AND DRAINAGE PERIRECTAL ABSCESS    . INGUINAL HERNIA REPAIR      Social History   Socioeconomic History  . Marital status: Widowed    Spouse name: Not on file  . Number of children: 3  . Years of education: 54  . Highest education level: Not on file  Occupational History  . Occupation: RETIRED    Employer: RETIRED  Tobacco Use  . Smoking status: Former Smoker    Types: Cigarettes    Quit date: 02/20/2003    Years since quitting: 16.1  . Smokeless tobacco: Never Used  Substance and Sexual Activity  . Alcohol use: Not Currently    Alcohol/week: 6.0 standard drinks    Types: 3 Glasses of wine, 3 Shots of liquor per week    Comment:  1-3 drinks a week  . Drug use: No  . Sexual activity: Not on file  Other Topics Concern  . 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    One story home   Right handed   3 children   Social Determinants of Health   Financial Resource Strain:   . Difficulty of Paying Living Expenses: Not on file  Food Insecurity:   . Worried About Charity fundraiser in the Last Year: Not on file  . Ran Out of Food in the Last Year: Not on file  Transportation Needs:   . Lack of Transportation (Medical): Not on  file  . Lack of Transportation (Non-Medical): Not on file  Physical Activity:   . Days of Exercise per Week: Not on file  . Minutes of Exercise per Session: Not on file  Stress:   . Feeling of Stress : Not on file  Social Connections:   . Frequency of Communication with Friends and Family: Not on file  . Frequency of Social Gatherings with Friends and Family: Not on file  . Attends Religious Services: Not on  file  . Active Member of Clubs or Organizations: Not on file  . Attends Archivist Meetings: Not on file  . Marital Status: Not on file    Family History  Problem Relation Age of Onset  . Heart failure Mother   . Heart disease Mother        CHF  . Heart attack Mother   . Hypertension Mother   . Heart attack Father   . Heart disease Father 34       MI  . Hypertension Father   . Heart attack Sister 10  . Heart disease Sister 72       MI  . Diabetes Sister   . Hyperlipidemia Sister   . Hypertension Sister   . Varicose Veins Sister   . Heart attack Brother 6  . Diabetes Brother   . Heart disease Brother 49       MI  . Cancer Brother   . Hypertension Brother   . Deep vein thrombosis Daughter   . Other Son        varicose veins  . Colon cancer Neg Hx     Review of Systems  Constitutional: Negative for chills and fever.  Respiratory: Positive for shortness of breath (chronic, no change). Negative for cough and wheezing.   Cardiovascular: Negative for chest pain, palpitations and leg swelling.  Gastrointestinal: Negative for nausea.       No gerd  Neurological: Negative for dizziness, light-headedness and headaches.       Objective:   Vitals:   04/17/19 0818  BP: (!) 168/70  Pulse: 86  Resp: 16  Temp: 97.9 F (36.6 C)  SpO2: 91%   BP Readings from Last 3 Encounters:  04/17/19 (!) 168/70  02/27/19 (!) 145/61  01/22/19 (!) 117/47   Wt Readings from Last 3 Encounters:  04/17/19 166 lb 6.4 oz (75.5 kg)  02/27/19 164 lb 8 oz (74.6 kg)   01/22/19 169 lb 7 oz (76.9 kg)   Body mass index is 23.21 kg/m.   Physical Exam    Constitutional: Appears well-developed and well-nourished. No distress.  HENT:  Head: Normocephalic and atraumatic.  Neck: Neck supple. No tracheal deviation present. No thyromegaly present.  No cervical lymphadenopathy Cardiovascular: Normal rate, regular rhythm and normal heart sounds.   No murmur heard. No carotid bruit .  No edema Pulmonary/Chest: Effort normal and breath sounds normal. No respiratory distress. No has no wheezes. No rales.  Skin: Skin is warm and dry. Not diaphoretic.  Psychiatric: Normal mood and affect. Behavior is normal.      Assessment & Plan:    See Problem List for Assessment and Plan of chronic medical problems.    This visit occurred during the SARS-CoV-2 public health emergency.  Safety protocols were in place, including screening questions prior to the visit, additional usage of staff PPE, and extensive cleaning of exam room while observing appropriate contact time as indicated for disinfecting solutions.

## 2019-04-17 ENCOUNTER — Encounter: Payer: Self-pay | Admitting: Neurology

## 2019-04-17 ENCOUNTER — Other Ambulatory Visit: Payer: Self-pay

## 2019-04-17 ENCOUNTER — Ambulatory Visit (INDEPENDENT_AMBULATORY_CARE_PROVIDER_SITE_OTHER): Payer: Medicare Other | Admitting: Internal Medicine

## 2019-04-17 ENCOUNTER — Encounter: Payer: Self-pay | Admitting: Internal Medicine

## 2019-04-17 VITALS — BP 168/70 | HR 86 | Temp 97.9°F | Resp 16 | Ht 71.0 in | Wt 166.4 lb

## 2019-04-17 DIAGNOSIS — I10 Essential (primary) hypertension: Secondary | ICD-10-CM | POA: Diagnosis not present

## 2019-04-17 DIAGNOSIS — J439 Emphysema, unspecified: Secondary | ICD-10-CM | POA: Diagnosis not present

## 2019-04-17 DIAGNOSIS — E1122 Type 2 diabetes mellitus with diabetic chronic kidney disease: Secondary | ICD-10-CM | POA: Diagnosis not present

## 2019-04-17 DIAGNOSIS — I2510 Atherosclerotic heart disease of native coronary artery without angina pectoris: Secondary | ICD-10-CM

## 2019-04-17 DIAGNOSIS — N182 Chronic kidney disease, stage 2 (mild): Secondary | ICD-10-CM

## 2019-04-17 DIAGNOSIS — E782 Mixed hyperlipidemia: Secondary | ICD-10-CM

## 2019-04-17 DIAGNOSIS — N1831 Chronic kidney disease, stage 3a: Secondary | ICD-10-CM | POA: Diagnosis not present

## 2019-04-17 DIAGNOSIS — G479 Sleep disorder, unspecified: Secondary | ICD-10-CM

## 2019-04-17 NOTE — Assessment & Plan Note (Signed)
Chronic Stable Has shortness of breath and states this is chronic without change Denies any coughing or wheezing Continue current inhalers Will be seeing pulmonary next week

## 2019-04-17 NOTE — Assessment & Plan Note (Signed)
Chronic Taking temazepam as needed only-often takes midmorning Advised and to take when he goes to bed if he has not had a good night sleep in a couple of nights-he does try to avoid taking it

## 2019-04-17 NOTE — Assessment & Plan Note (Signed)
Chronic Following with cardiology Denies chest pain, palpitations and leg swelling Continue current medications

## 2019-04-17 NOTE — Assessment & Plan Note (Signed)
Chronic Stable Continue current medications Continue good fluid intake Avoid NSAIDs

## 2019-04-17 NOTE — Assessment & Plan Note (Signed)
Chronic Blood pressure elevated here today, but reasonably controlled at home He will continue to monitor at home Continue current medications at current doses

## 2019-04-17 NOTE — Assessment & Plan Note (Signed)
Chronic Continue simvastatin at current dose Lipids have been well controlled

## 2019-04-17 NOTE — Assessment & Plan Note (Signed)
Lab Results  Component Value Date   HGBA1C 6.3 03/18/2019   Sugars very well controlled Diet controlled Follow-up in 6 months

## 2019-04-20 ENCOUNTER — Encounter: Payer: Self-pay | Admitting: Neurology

## 2019-04-20 ENCOUNTER — Telehealth (INDEPENDENT_AMBULATORY_CARE_PROVIDER_SITE_OTHER): Payer: Medicare Other | Admitting: Neurology

## 2019-04-20 ENCOUNTER — Other Ambulatory Visit: Payer: Self-pay

## 2019-04-20 DIAGNOSIS — G5602 Carpal tunnel syndrome, left upper limb: Secondary | ICD-10-CM

## 2019-04-20 NOTE — Progress Notes (Signed)
   Due to the COVID-19 crisis, this virtual check-in visit was done via telephone from my office and it was initiated and consent given by this patient and or family.   Telephone (Audio) Visit The purpose of this telephone visit is to provide medical care while limiting exposure to the novel coronavirus.    Consent was obtained for telephone visit and initiated by pt/family:  Yes.   Answered questions that patient had about telehealth interaction:  Yes.   I discussed the limitations, risks, security and privacy concerns of performing an evaluation and management service by telephone. I also discussed with the patient that there may be a patient responsible charge related to this service. The patient expressed understanding and agreed to proceed.  Pt location: Home Physician Location: office Name of referring provider:  Binnie Rail, MD I connected with .Brian [name] at patients initiation/request on 04/20/2019 at 10:50 AM EST by telephone and verified that I am speaking with the correct person using two identifiers.  Pt MRN:  [id number] Pt DOB:  Jun 25, 1931   History of Present Illness: This is an 86 year old man returning for follow-up of left carpal tunnel syndrome and ulnar neuropathy.  He underwent electrodiagnostic testing in October which showed moderate left carpal tunnel and mild left ulnar neuropathy at the elbow.  He has been compliant with using the left wrist brace and feels that pain has subsided.  He was also briefly on gabapentin, but did not tolerate this due to cognitive side effects and discontinued it. He denies any new weakness.  He also complains of several month history of left biceps achy pain.  Needle electrode examination of the muscle was normal.  DATA: NCS/EMG of the arms 12/16/2018: 1. Left median neuropathy at or distal to the wrist (moderate), consistent with a clinical diagnosis of carpal tunnel syndrome.   2. Left ulnar neuropathy with slowing across the  elbow, purely demyelinating in type, mild. 3. There is no evidence of a sensorimotor polyneuropathy or lumbosacral radiculopathy affecting the left lower extremity.  Assessment and Plan:   1.  Left carpal tunnel syndrome (moderate), stable with improved pain since using wrist splints  - Continue wrist splint day and night  -Gabapentin discontinued due to cognitive side effects.  If he develops worsening pain going forward, consider nortriptyline  2.  Left arm pain localized over the biceps muscle seems more musculoskeletal.  I encouraged him to use heat, ice, or Tylenol.  If no improvement, follow-up with PCP.   Follow Up Instructions:   I discussed the assessment and treatment plan with the patient. The patient was provided an opportunity to ask questions and all were answered. The patient agreed with the plan and demonstrated an understanding of the instructions.   The patient was advised to call back or seek an in-person evaluation if the symptoms worsen or if the condition fails to improve as anticipated.   Total Time spent in visit with the patient was:  7 min, of which 100% of the time was spent in counseling and/or coordinating care.   Pt understands and agrees with the plan of care outlined.     Brian Berthold, DO

## 2019-04-27 ENCOUNTER — Other Ambulatory Visit: Payer: Self-pay

## 2019-04-27 ENCOUNTER — Ambulatory Visit (INDEPENDENT_AMBULATORY_CARE_PROVIDER_SITE_OTHER): Payer: Medicare Other | Admitting: Emergency Medicine

## 2019-04-27 ENCOUNTER — Encounter: Payer: Self-pay | Admitting: Emergency Medicine

## 2019-04-27 VITALS — BP 164/68 | HR 60 | Temp 98.2°F | Ht 71.0 in | Wt 166.0 lb

## 2019-04-27 DIAGNOSIS — R918 Other nonspecific abnormal finding of lung field: Secondary | ICD-10-CM | POA: Insufficient documentation

## 2019-04-27 DIAGNOSIS — R911 Solitary pulmonary nodule: Secondary | ICD-10-CM | POA: Diagnosis not present

## 2019-04-27 DIAGNOSIS — J439 Emphysema, unspecified: Secondary | ICD-10-CM | POA: Diagnosis not present

## 2019-04-27 NOTE — Progress Notes (Signed)
Subjective:    Patient ID: Brian [name], male    DOB: September 01, 1931, 86 y.o.   MRN: [id number]  HPI 86 year old former smoker (40 pack years) with a history of aortic aneurysm and repair from 01/2019, CAD/CABG, diabetes, hypertension.  He also carries a history of COPD that was made.  PFT from 04/01/2015 reviewed by me showed severe obstruction with a positive bronchodilator response, hyperinflated volumes and a decreased diffusion capacity.  He is referred today for pulmonary nodular disease noted on CT chest.  This was done in preparation for his aortic aneurysm procedure.  12/29/2018 and I have reviewed, shows bilateral upper lobe pulmonary nodules, 0.7 cm in the apical left upper lobe, 1.6 cm in the lateral right upper lobe.  He is on Anoro 1 inhalation once daily, has albuterol uses rarely. He is able to walk 30-45 minutes. Has trouble with carrying objects. No cough or wheeze.    Review of Systems  Constitutional: Negative for fever and unexpected weight change.  HENT: Positive for rhinorrhea. Negative for congestion, dental problem, ear pain, nosebleeds, postnasal drip, sinus pressure, sneezing, sore throat and trouble swallowing.   Eyes: Negative for redness and itching.  Respiratory: Positive for shortness of breath. Negative for cough, chest tightness and wheezing.   Cardiovascular: Negative for palpitations and leg swelling.  Gastrointestinal: Negative for nausea and vomiting.  Genitourinary: Negative for dysuria.  Musculoskeletal: Negative for joint swelling.  Skin: Negative for rash.  Allergic/Immunologic: Negative.  Negative for environmental allergies, food allergies and immunocompromised state.  Neurological: Negative for headaches.  Hematological: Does not bruise/bleed easily.  Psychiatric/Behavioral: Negative for dysphoric mood. The patient is not nervous/anxious.      Past Medical History:  Diagnosis Date  . AAA (abdominal aortic aneurysm) ([place])   . Arthritis   .  Asthma 1976  . Bronchiolitis May 2016  . CAD (coronary artery disease)    NSTEMI 02/2010;  s/p CABG 1/12 (L-LAD, SOM1, S-PDA); echo in 08/2010: Mild LVH, EF 50%, grade 2 diastolic dysfunction, mild MR, mild LAE, mildly reduced RV function, mild RAE, PASP 42  . Carpal tunnel syndrome of left wrist   . Colon polyps   . COPD (chronic obstructive pulmonary disease) ([place])   . Diabetes mellitus 2000   Type II, controlls with diet  . Dyspnea   . ED (erectile dysfunction)   . Gout   . HTN (hypertension)   . Hyperlipidemia   . Inguinal hernia    Right  . Macular degeneration   . Myocardial infarction ([place]) 1 -13-2012  . Neuropathy      Family History  Problem Relation Age of Onset  . Heart failure Mother   . Heart disease Mother        CHF  . Heart attack Mother   . Hypertension Mother   . Heart attack Father   . Heart disease Father 41       MI  . Hypertension Father   . Heart attack Sister 59  . Heart disease Sister 44       MI  . Diabetes Sister   . Hyperlipidemia Sister   . Hypertension Sister   . Varicose Veins Sister   . Heart attack Brother 93  . Diabetes Brother   . Heart disease Brother 62       MI  . Cancer Brother   . Hypertension Brother   . Deep vein thrombosis Daughter   . Other Son        varicose veins  .  Colon cancer Neg Hx      Social History   Socioeconomic History  . Marital status: Widowed    Spouse name: Not on file  . Number of children: 3  . Years of education: 46  . Highest education level: Not on file  Occupational History  . Occupation: RETIRED    Employer: RETIRED  Tobacco Use  . Smoking status: Former Smoker    Packs/day: 1.00    Years: 40.00    Pack years: 84.00    Types: Cigarettes    Quit date: 02/20/2003    Years since quitting: 16.1  . Smokeless tobacco: Never Used  Substance and Sexual Activity  . Alcohol use: Not Currently    Alcohol/week: 6.0 standard drinks    Types: 3 Glasses of wine, 3 Shots of liquor per week     Comment:  1-3 drinks a week  . Drug use: No  . Sexual activity: Not on file  Other Topics Concern  . 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    One story home   Right handed   3 children   Social Determinants of Health   Financial Resource Strain:   . Difficulty of Paying Living Expenses: Not on file  Food Insecurity:   . Worried About Charity fundraiser in the Last Year: Not on file  . Ran Out of Food in the Last Year: Not on file  Transportation Needs:   . Lack of Transportation (Medical): Not on file  . Lack of Transportation (Non-Medical): Not on file  Physical Activity:   . Days of Exercise per Week: Not on file  . Minutes of Exercise per Session: Not on file  Stress:   . Feeling of Stress : Not on file  Social Connections:   . Frequency of Communication with Friends and Family: Not on file  . Frequency of Social Gatherings with Friends and Family: Not on file  . Attends Religious Services: Not on file  . Active Member of Clubs or Organizations: Not on file  . Attends Archivist Meetings: Not on file  . Marital Status: Not on file  Intimate Partner Violence:   . Fear of Current or Ex-Partner: Not on file  . Emotionally Abused: Not on file  . Physically Abused: Not on file  . Sexually Abused: Not on file  from [place], has lived in [place] since.  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 Spent time in [place], [place], [place].  Was a Biochemist, clinical for [employer].  No hobbies that would cause an exposure.  No known TB exposure.   Allergies  Allergen Reactions  . Meperidine Hcl     Tongue swelling Because of a history of documented adverse serious drug reaction;Medi Alert bracelet  is recommended  . Zolpidem Tartrate Other (See Comments)    Nightmares     Outpatient Medications Prior to Visit  Medication Sig Dispense Refill  . ACCU-CHEK COMPACT STRIPS test strip TEST BLOOD  SUGAR ONCE DAILY 100 each 1  . ANORO ELLIPTA 62.5-25 MCG/INH AEPB USE 1 INHALATION DAILY (Patient taking differently: Inhale 1 puff into the lungs daily. ) 120 each 5  . Aspirin (ADULT ASPIRIN LOW STRENGTH) 81 MG EC tablet Take 81 mg by mouth daily.      . cholecalciferol (VITAMIN D) 1000 UNITS tablet Take 3,000 Units by mouth daily.     . enalapril (  VASOTEC) 10 MG tablet TAKE 1 TABLET TWICE A DAY (Patient taking differently: Take 10 mg by mouth 2 (two) times daily. ) 180 tablet 1  . fluticasone (FLONASE) 50 MCG/ACT nasal spray Place 2 sprays into both nostrils daily as needed for allergies or rhinitis.     . Magnesium Oxide 420 MG TABS Take 420 mg by mouth daily.     . metoprolol succinate (TOPROL-XL) 25 MG 24 hr tablet TAKE ONE-HALF (1/2) TABLET DAILY 45 tablet 3  . Multiple Vitamins-Minerals (MULTIVITAL PO) Take 1 tablet by mouth daily.     Vladimir Faster Glycol-Propyl Glycol (SYSTANE) 0.4-0.3 % SOLN Place 1 drop into both eyes daily.    . simvastatin (ZOCOR) 40 MG tablet TAKE 1 TABLET DAILY (Patient taking differently: Take 40 mg by mouth at bedtime. ) 90 tablet 3  . temazepam (RESTORIL) 7.5 MG capsule Take 1 capsule (7.5 mg total) by mouth at bedtime as needed. 90 capsule 1  . triamcinolone cream (KENALOG) 0.1 % Apply 1 application topically daily as needed (Rash).     No facility-administered medications prior to visit.        Objective:   Physical Exam  Vitals:   04/27/19 1518  BP: (!) 164/68  Pulse: 60  Temp: 98.2 F (36.8 C)  TempSrc: Temporal  SpO2: 92%  Weight: 166 lb (75.3 kg)  Height: 5' 11"  (1.803 m)   Gen: Pleasant, well-nourished, in no distress,  normal affect  ENT: No lesions,  mouth clear,  oropharynx clear, no postnasal drip  Neck: No JVD, no stridor  Lungs: No use of accessory muscles, no crackles or wheezing on normal respiration, no wheeze on forced expiration  Cardiovascular: RRR, heart sounds normal, no murmur or gallops, no peripheral  edema  Musculoskeletal: No deformities, no cyanosis or clubbing  Neuro: alert, awake, non focal  Skin: Warm, no lesions or rash     Assessment & Plan:  COPD (chronic obstructive pulmonary disease) (H[facility]) This and exertional tolerance on Anoro.  Rare albuterol use.  No exacerbations.  Plan to follow him on this regimen  Pulmonary nodules Found spuriously when he was being evaluated for his AAA.  Moderate risk given his tobacco history.  Plan to repeat his CT chest in May at the 67-monthmark.  At that time assess risk, decide whether diagnostic procedures are indicated.  RBaltazar Apo MD, PhD 04/27/2019, 3:55 PM [facility] [phone] if no answer [phone]

## 2019-04-27 NOTE — Assessment & Plan Note (Signed)
Found spuriously when he was being evaluated for his AAA.  Moderate risk given his tobacco history.  Plan to repeat his CT chest in May at the 66-month mark.  At that time assess risk, decide whether diagnostic procedures are indicated.

## 2019-04-27 NOTE — Assessment & Plan Note (Signed)
This and exertional tolerance on Anoro.  Rare albuterol use.  No exacerbations.  Plan to follow him on this regimen

## 2019-04-27 NOTE — Patient Instructions (Addendum)
We will plan to repeat your CT scan of the chest in May 2021 to follow pulmonary nodules. Please continue your Anoro 1 inhalation once daily as you have been taking it. Keep albuterol available to use 2 puffs if needed for shortness of breath, chest tightness, wheezing. Follow with Dr Lamonte Sakai in May after your CT chest to review the results together.

## 2019-05-11 ENCOUNTER — Ambulatory Visit (INDEPENDENT_AMBULATORY_CARE_PROVIDER_SITE_OTHER): Payer: Medicare Other | Admitting: Internal Medicine

## 2019-05-11 ENCOUNTER — Encounter: Payer: Self-pay | Admitting: Internal Medicine

## 2019-05-11 ENCOUNTER — Other Ambulatory Visit: Payer: Self-pay

## 2019-05-11 DIAGNOSIS — M79622 Pain in left upper arm: Secondary | ICD-10-CM | POA: Diagnosis not present

## 2019-05-11 NOTE — Progress Notes (Signed)
Subjective:    Patient ID: Brian [name], male    DOB: 04-Jan-1932, 86 y.o.   MRN: [id number]  HPI The patient is here for an acute visit.   Left biceps pain-he has been having pain in his left bicep area that comes down towards his antecubital region for several weeks.  When he was here at his last visit I started him on gabapentin and the pain did resolve with that, but returned when the medication was stopped.  The pain is a dull pain.  He feels it at rest, but it is worse with certain activities.  It comes and goes.  He denies any numbness or tingling.  He has not taken anything for it including Tylenol.  He denies any shoulder pain or neck pain.  He did have an EMG for his left upper extremity by neurology and was diagnosed with carpal tunnel and ulnar neuropathy.    Medications and allergies reviewed with patient and updated if appropriate.  Patient Active Problem List   Diagnosis Date Noted  . Pulmonary nodules 04/27/2019  . AAA (abdominal aortic aneurysm) ([place]) 01/21/2019  . Nerve pain 10/15/2018  . Low vitamin B12 level 09/30/2018  . LUQ pain 09/30/2018  . Arthritis of carpometacarpal (CMC) joint of thumb 09/30/2018  . Chest pain 01/29/2018  . Decreased hearing of right ear 11/23/2017  . Vertigo 07/17/2016  . Nocturnal leg cramps 09/20/2015  . COPD (chronic obstructive pulmonary disease) ([place]) 05/31/2015  . CKD (chronic kidney disease) stage 3, GFR 30-59 ml/min 05/31/2015  . Diabetes ([place]) 04/26/2015  . AAA (abdominal aortic aneurysm) without rupture ([place]) 01/18/2015  . PAD (peripheral artery disease) ([place]) 01/18/2015  . Hereditary and idiopathic peripheral neuropathy 10/29/2012  . Hypertension 05/10/2011  . CAD (coronary artery disease) 09/05/2010  . TINNITUS 04/04/2010  . CORONARY ARTERY BYPASS GRAFT, HX OF 03/31/2010  . Disturbance in sleep behavior 12/28/2008  . Cervicalgia 07/26/2008  . POLYP, COLON 07/16/2007  . Hyperlipidemia 09/24/2006  . History of gout  09/23/2006    Current Outpatient Medications on File Prior to Visit  Medication Sig Dispense Refill  . ACCU-CHEK COMPACT STRIPS test strip TEST BLOOD SUGAR ONCE DAILY 100 each 1  . ANORO ELLIPTA 62.5-25 MCG/INH AEPB USE 1 INHALATION DAILY (Patient taking differently: Inhale 1 puff into the lungs daily. ) 120 each 5  . Aspirin (ADULT ASPIRIN LOW STRENGTH) 81 MG EC tablet Take 81 mg by mouth daily.      . cholecalciferol (VITAMIN D) 1000 UNITS tablet Take 3,000 Units by mouth daily.     . enalapril (VASOTEC) 10 MG tablet TAKE 1 TABLET TWICE A DAY (Patient taking differently: Take 10 mg by mouth 2 (two) times daily. ) 180 tablet 1  . Magnesium Oxide 420 MG TABS Take 420 mg by mouth daily.     . metoprolol succinate (TOPROL-XL) 25 MG 24 hr tablet TAKE ONE-HALF (1/2) TABLET DAILY 45 tablet 3  . Multiple Vitamins-Minerals (MULTIVITAL PO) Take 1 tablet by mouth daily.     Vladimir Faster Glycol-Propyl Glycol (SYSTANE) 0.4-0.3 % SOLN Place 1 drop into both eyes daily.    . simvastatin (ZOCOR) 40 MG tablet TAKE 1 TABLET DAILY (Patient taking differently: Take 40 mg by mouth at bedtime. ) 90 tablet 3  . temazepam (RESTORIL) 7.5 MG capsule Take 1 capsule (7.5 mg total) by mouth at bedtime as needed. 90 capsule 1  . triamcinolone cream (KENALOG) 0.1 % Apply 1 application topically daily as needed (Rash).  No current facility-administered medications on file prior to visit.    Past Medical History:  Diagnosis Date  . AAA (abdominal aortic aneurysm) ([place])   . Arthritis   . Asthma 1976  . Bronchiolitis May 2016  . CAD (coronary artery disease)    NSTEMI 02/2010;  s/p CABG 1/12 (L-LAD, SOM1, S-PDA); echo in 08/2010: Mild LVH, EF 19%, grade 2 diastolic dysfunction, mild MR, mild LAE, mildly reduced RV function, mild RAE, PASP 42  . Carpal tunnel syndrome of left wrist   . Colon polyps   . COPD (chronic obstructive pulmonary disease) ([place])   . Diabetes mellitus 2000   Type II, controlls with diet  .  Dyspnea   . ED (erectile dysfunction)   . Gout   . HTN (hypertension)   . Hyperlipidemia   . Inguinal hernia    Right  . Macular degeneration   . Myocardial infarction ([place]) 1 -13-2012  . Neuropathy     Past Surgical History:  Procedure Laterality Date  . 3 vessel CABG  03/03/2010  . ABDOMINAL AORTIC ENDOVASCULAR STENT GRAFT N/A 01/21/2019   Procedure: ABDOMINAL AORTIC ENDOVASCULAR STENT GRAFT;  Surgeon: Waynetta Sandy, MD;  Location: [facility];  Service: Vascular;  Laterality: N/A;  . cataract left eye    . COLONOSCOPY     neg 2009  . COLONOSCOPY  2014   5 mm sessile polyp; AVM  . CORONARY ARTERY BYPASS GRAFT  2012   X3  . ENDOVASCULAR REPAIR/STENT GRAFT  01/21/2019   ABDOMINAL AORTIC ENDOVASCULAR STENT GRAFT (N/A )  . EYE SURGERY     Left cataract  . HERNIA REPAIR    . INCISION AND DRAINAGE PERIRECTAL ABSCESS    . INGUINAL HERNIA REPAIR      Social History   Socioeconomic History  . Marital status: Widowed    Spouse name: Not on file  . Number of children: 3  . Years of education: 90  . Highest education level: Not on file  Occupational History  . Occupation: RETIRED    Employer: RETIRED  Tobacco Use  . Smoking status: Former Smoker    Packs/day: 1.00    Years: 40.00    Pack years: 40.00    Types: Cigarettes    Quit date: 02/20/2003    Years since quitting: 16.2  . Smokeless tobacco: Never Used  Substance and Sexual Activity  . Alcohol use: Not Currently    Alcohol/week: 6.0 standard drinks    Types: 3 Glasses of wine, 3 Shots of liquor per week    Comment:  1-3 drinks a week  . Drug use: No  . Sexual activity: Not on file  Other Topics Concern  . 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    One story home   Right handed   3 children   Social Determinants of Health   Financial Resource Strain:   . Difficulty of Paying Living Expenses:   Food Insecurity:   . Worried About Paediatric nurse in the Last Year:   . Arboriculturist in the Last Year:   Transportation Needs:   . Film/video editor (Medical):   Marland Kitchen Lack of Transportation (Non-Medical):   Physical Activity:   . Days of Exercise per Week:   . Minutes of Exercise per Session:   Stress:   . Feeling of Stress :   Social Connections:   . Frequency of  Communication with Friends and Family:   . Frequency of Social Gatherings with Friends and Family:   . Attends Religious Services:   . Active Member of Clubs or Organizations:   . Attends Archivist Meetings:   Marland Kitchen Marital Status:     Family History  Problem Relation Age of Onset  . Heart failure Mother   . Heart disease Mother        CHF  . Heart attack Mother   . Hypertension Mother   . Heart attack Father   . Heart disease Father 9       MI  . Hypertension Father   . Heart attack Sister 12  . Heart disease Sister 44       MI  . Diabetes Sister   . Hyperlipidemia Sister   . Hypertension Sister   . Varicose Veins Sister   . Heart attack Brother 92  . Diabetes Brother   . Heart disease Brother 67       MI  . Cancer Brother   . Hypertension Brother   . Deep vein thrombosis Daughter   . Other Son        varicose veins  . Colon cancer Neg Hx     Review of Systems Per HPI    Objective:   Vitals:   05/11/19 1554  BP: (!) 176/70  Pulse: 82  Resp: 16  Temp: 98.3 F (36.8 C)  SpO2: 95%   BP Readings from Last 3 Encounters:  05/11/19 (!) 176/70  04/27/19 (!) 164/68  04/17/19 (!) 168/70   Wt Readings from Last 3 Encounters:  05/11/19 166 lb 6.4 oz (75.5 kg)  04/27/19 166 lb (75.3 kg)  04/17/19 166 lb 6.4 oz (75.5 kg)   Body mass index is 23.21 kg/m.   Physical Exam Constitutional:      Appearance: Normal appearance.  HENT:     Head: Normocephalic and atraumatic.  Musculoskeletal:        General: No swelling or deformity.     Comments: No left bicep tenderness with palpation, no shoulder, upper back or  posterior neck tenderness with palpation.  Good range of motion of left shoulder without pain in left bicep area  Skin:    General: Skin is warm and dry.     Findings: No erythema or rash.  Neurological:     Mental Status: He is alert.            Assessment & Plan:    See Problem List for Assessment and Plan of chronic medical problems.    This visit occurred during the SARS-CoV-2 public health emergency.  Safety protocols were in place, including screening questions prior to the visit, additional usage of staff PPE, and extensive cleaning of exam room while observing appropriate contact time as indicated for disinfecting solutions.

## 2019-05-11 NOTE — Assessment & Plan Note (Signed)
Pain in left biceps Ongoing for a few weeks Resolved with gabapentin Dull pain that he feels at rest, worse with activity Intermittent, no associated numbness or tingling No swelling, bruising No injury ?  Referred pain Did have an EMG no evidence of cervical radiculopathy ?  Related to shoulder I am unsure what is causing his pain Discussed referral to sports medicine for further evaluation-he declined today If his pain continues or worsens he will let me know if he wants to be referred

## 2019-05-11 NOTE — Patient Instructions (Signed)
If your left arm pain continues please let me know so we can have you see sports medicine.

## 2019-06-09 ENCOUNTER — Other Ambulatory Visit: Payer: Self-pay | Admitting: Internal Medicine

## 2019-06-10 ENCOUNTER — Ambulatory Visit: Payer: Medicare Other

## 2019-06-12 ENCOUNTER — Other Ambulatory Visit: Payer: Self-pay

## 2019-06-12 MED ORDER — TEMAZEPAM 7.5 MG PO CAPS: 7.5000 mg | ORAL_CAPSULE | Freq: Every evening | ORAL | 1 refills | Status: DC | PRN

## 2019-06-12 NOTE — Telephone Encounter (Signed)
Last OV 04/17/19 Next OV 10/15/19 Last RF 02/05/18

## 2019-06-23 ENCOUNTER — Other Ambulatory Visit: Payer: Self-pay

## 2019-06-23 ENCOUNTER — Ambulatory Visit (INDEPENDENT_AMBULATORY_CARE_PROVIDER_SITE_OTHER)
Admission: RE | Admit: 2019-06-23 | Discharge: 2019-06-23 | Disposition: A | Discharge status: Home / Self Care | Payer: Medicare Other | Source: Ambulatory Visit | Attending: Emergency Medicine | Admitting: Emergency Medicine

## 2019-06-23 DIAGNOSIS — J432 Centrilobular emphysema: Secondary | ICD-10-CM | POA: Diagnosis not present

## 2019-06-23 DIAGNOSIS — R918 Other nonspecific abnormal finding of lung field: Secondary | ICD-10-CM | POA: Diagnosis not present

## 2019-06-23 DIAGNOSIS — R911 Solitary pulmonary nodule: Secondary | ICD-10-CM | POA: Diagnosis not present

## 2019-06-27 ENCOUNTER — Other Ambulatory Visit: Payer: Self-pay | Admitting: Internal Medicine

## 2019-06-29 ENCOUNTER — Telehealth: Payer: Self-pay | Admitting: Internal Medicine

## 2019-06-29 MED ORDER — TEMAZEPAM 7.5 MG PO CAPS: 7.5000 mg | ORAL_CAPSULE | Freq: Every evening | ORAL | 1 refills | Status: DC | PRN

## 2019-06-29 NOTE — Telephone Encounter (Signed)
New Message:   Pt is calling and states he received a call from [facility] this weekend and states they need a new prescription sent for temazepam (RESTORIL) 7.5 MG capsule. Please advise.

## 2019-06-29 NOTE — Telephone Encounter (Signed)
Last RF was 02/05/18 Last OV 04/17/19 Next OV 10/15/19

## 2019-07-09 ENCOUNTER — Other Ambulatory Visit: Payer: Self-pay

## 2019-07-09 ENCOUNTER — Encounter: Payer: Self-pay | Admitting: Emergency Medicine

## 2019-07-09 ENCOUNTER — Ambulatory Visit (INDEPENDENT_AMBULATORY_CARE_PROVIDER_SITE_OTHER): Payer: Medicare Other | Admitting: Emergency Medicine

## 2019-07-09 DIAGNOSIS — R918 Other nonspecific abnormal finding of lung field: Secondary | ICD-10-CM

## 2019-07-09 DIAGNOSIS — J439 Emphysema, unspecified: Secondary | ICD-10-CM

## 2019-07-09 NOTE — Patient Instructions (Signed)
Please continue Anoro once daily as you have been taking it. Keep albuterol available to use 2 puffs up to every 4 hours if needed for shortness of breath, chest tightness, wheezing.  We will repeat your CT scan of the chest in 6 months to compare with your priors, follow your pulmonary nodules for stability. Walking oximetry today on room air Follow with Dr Lamonte Sakai in 6 months to review your CT scan or sooner if you have any problems

## 2019-07-09 NOTE — Addendum Note (Signed)
Addended by: Gavin Potters R on: 07/09/2019 11:35 AM   Modules accepted: Orders

## 2019-07-09 NOTE — Progress Notes (Signed)
Subjective:    Patient ID: Brian [name], male    DOB: 05/18/1931, 86 y.o.   MRN: [id number]  HPI 86 year old former smoker (40 pack years) with a history of aortic aneurysm and repair from 01/2019, CAD/CABG, diabetes, hypertension.  He also carries a history of COPD that was made.  PFT from 04/01/2015 reviewed by me showed severe obstruction with a positive bronchodilator response, hyperinflated volumes and a decreased diffusion capacity.  He is referred today for pulmonary nodular disease noted on CT chest.  This was done in preparation for his aortic aneurysm procedure.  12/29/2018 and I have reviewed, shows bilateral upper lobe pulmonary nodules, 0.7 cm in the apical left upper lobe, 1.6 cm in the lateral right upper lobe.  He is on Anoro 1 inhalation once daily, has albuterol uses rarely. He is able to walk 30-45 minutes. Has trouble with carrying objects. No cough or wheeze.   ROV 07/09/19 --follow-up visit for 86 year old gentleman with COPD, CAD/CABG, diabetes, hypertension, severe obstruction by pulmonary function testing.  He has been managed on Anoro.  He also has bilateral upper lobe pulmonary nodular disease identified on CT chest 12/29/2018. We repeated a CT chest on 06/23/2019 which I have reviewed, shows advanced centrilobular and paraseptal emphysema, a possible slight increase in size in his lateral right upper lobe pulmonary nodule to 1.6 x 1.4 cm, stable left upper lobe nodule 0.6 x 0.5 cm.  He desaturate on ambulation into the room this morning, quickly recovered with rest. Still able to walk and exert at home at his own pace.  He is well, no exacerbations, uses albuterol 2-3x a week Has had his COVID vaccine  MDM:  I reviewed the patient's CT chest from 06/23/2019 I reviewed his internal medicine notes from 05/11/2019, 06/29/2019 from Dr. Quay Burow   Review of Systems  Constitutional: Negative for fever and unexpected weight change.  HENT:  Negative for congestion, dental problem,  ear pain, nosebleeds, postnasal drip, sinus pressure, sneezing, sore throat and trouble swallowing.   Eyes: Negative for redness and itching.  Respiratory: Positive for intermittent exertional  shortness of breath. Negative for cough, chest tightness and wheezing.   Cardiovascular: Negative for palpitations and leg swelling.  Gastrointestinal: Negative for nausea and vomiting.  Genitourinary: Negative for dysuria.  Musculoskeletal: Negative for joint swelling.  Skin: Negative for rash.  Allergic/Immunologic: Negative.  Negative for environmental allergies, food allergies and immunocompromised state.  Neurological: Negative for headaches.  Hematological: Does not bruise/bleed easily.  Psychiatric/Behavioral: Negative for dysphoric mood. The patient is not nervous/anxious.          Objective:   Physical Exam  Vitals:   07/09/19 0940  BP: (!) 146/72  Pulse: 73  Temp: 97.8 F (36.6 C)  TempSrc: Temporal  SpO2: 97%  Weight: 167 lb 3.2 oz (75.8 kg)  Height: 5\' 11"  (1.803 m)   Gen: Pleasant, well-nourished, in no distress,  normal affect  ENT: No lesions,  mouth clear,  oropharynx clear, no postnasal drip  Neck: No JVD, no stridor  Lungs: No use of accessory muscles, no crackles or wheezing on normal respiration, no wheeze on forced expiration  Cardiovascular: RRR, heart sounds normal, no murmur or gallops, no peripheral edema  Musculoskeletal: No deformities, no cyanosis or clubbing  Neuro: alert, awake, non focal  Skin: Warm, no lesions or rash     Assessment & Plan:  COPD (chronic obstructive pulmonary disease) (HCC) Severe COPD.  He remains active, can do yard work, can walk but  he did desaturate on ambulation into the office today.  He may have occult hypoxemia and we need to perform walking oximetry today.  Continue Anoro, albuterol as needed.  No exacerbations.  His Covid shots are up-to-date.  If he does qualify for supplemental oxygen will have to discuss getting  this for him.  Pulmonary nodules Bilateral upper lobe pulmonary nodules, suspicious for primary lung cancer, possible synchronous primaries.  Minimal if any change in appearance and size.  I think we can follow in 6 months for interval change, determine whether biopsy, PET scan, etc. are indicated after that study.  Baltazar Apo, MD, PhD 07/09/2019, 10:01 AM [facility] [phone] or if no answer [phone] f

## 2019-07-09 NOTE — Assessment & Plan Note (Signed)
Bilateral upper lobe pulmonary nodules, suspicious for primary lung cancer, possible synchronous primaries.  Minimal if any change in appearance and size.  I think we can follow in 6 months for interval change, determine whether biopsy, PET scan, etc. are indicated after that study.

## 2019-07-09 NOTE — Assessment & Plan Note (Signed)
Severe COPD.  He remains active, can do yard work, can walk but he did desaturate on ambulation into the office today.  He may have occult hypoxemia and we need to perform walking oximetry today.  Continue Anoro, albuterol as needed.  No exacerbations.  His Covid shots are up-to-date.  If he does qualify for supplemental oxygen will have to discuss getting this for him.

## 2019-07-17 ENCOUNTER — Other Ambulatory Visit: Payer: Self-pay

## 2019-07-17 ENCOUNTER — Encounter: Payer: Self-pay | Admitting: Podiatry

## 2019-07-17 ENCOUNTER — Ambulatory Visit (INDEPENDENT_AMBULATORY_CARE_PROVIDER_SITE_OTHER): Payer: Medicare Other | Admitting: Podiatry

## 2019-07-17 DIAGNOSIS — E1159 Type 2 diabetes mellitus with other circulatory complications: Secondary | ICD-10-CM | POA: Diagnosis not present

## 2019-07-17 DIAGNOSIS — M79674 Pain in right toe(s): Secondary | ICD-10-CM

## 2019-07-17 DIAGNOSIS — N183 Chronic kidney disease, stage 3 unspecified: Secondary | ICD-10-CM | POA: Diagnosis not present

## 2019-07-17 DIAGNOSIS — I739 Peripheral vascular disease, unspecified: Secondary | ICD-10-CM

## 2019-07-17 DIAGNOSIS — M79675 Pain in left toe(s): Secondary | ICD-10-CM

## 2019-07-17 DIAGNOSIS — B351 Tinea unguium: Secondary | ICD-10-CM

## 2019-07-17 NOTE — Progress Notes (Signed)
This patient returns to my office for at risk foot care.  This patient requires this care by a professional since this patient will be at risk due to having chronic kidney disease and diabetes.  This patient is unable to cut nails himself since the patient cannot reach his nails.These nails are painful walking and wearing shoes.  This patient presents for at risk foot care today.  General Appearance  Alert, conversant and in no acute stress.  Vascular  Dorsalis pedis and posterior tibial  pulses are absent   bilaterally.  Capillary return is within normal limits  bilaterally. Temperature is within normal limits  Bilaterally. Venous stasis  Left leg/foot.  Neurologic  Senn-Weinstein monofilament wire test within normal limits  bilaterally. Muscle power within normal limits bilaterally.  Nails Thick disfigured discolored nails with subungual debris  from hallux to fifth toes bilaterally. No evidence of bacterial infection or drainage bilaterally.  Orthopedic  No limitations of motion  feet .  No crepitus or effusions noted.  No bony pathology or digital deformities noted.  HAV  B/L.  Skin  normotropic skin with no porokeratosis noted bilaterally.  No signs of infections or ulcers noted.     Onychomycosis  Pain in right toes  Pain in left toes  Consent was obtained for treatment procedures.   Mechanical debridement of nails 1-5  bilaterally performed with a nail nipper.  Filed with dremel without incident.    Return office visit  3 months                    Told patient to return for periodic foot care and evaluation due to potential at risk complications.   Gardiner Barefoot DPM

## 2019-07-28 ENCOUNTER — Other Ambulatory Visit: Payer: Self-pay | Admitting: Internal Medicine

## 2019-08-05 ENCOUNTER — Other Ambulatory Visit: Payer: Self-pay

## 2019-08-05 ENCOUNTER — Encounter: Payer: Self-pay | Admitting: Family

## 2019-08-05 ENCOUNTER — Ambulatory Visit (INDEPENDENT_AMBULATORY_CARE_PROVIDER_SITE_OTHER): Payer: Medicare Other | Admitting: Family

## 2019-08-05 VITALS — BP 130/64 | HR 61 | Temp 97.9°F | Wt 163.8 lb

## 2019-08-05 DIAGNOSIS — R634 Abnormal weight loss: Secondary | ICD-10-CM

## 2019-08-05 DIAGNOSIS — R195 Other fecal abnormalities: Secondary | ICD-10-CM | POA: Diagnosis not present

## 2019-08-05 LAB — COMPREHENSIVE METABOLIC PANEL
ALT: 11 U/L (ref 0–53)
AST: 17 U/L (ref 0–37)
Albumin: 4.2 g/dL (ref 3.5–5.2)
Alkaline Phosphatase: 64 U/L (ref 39–117)
BUN: 30 mg/dL — ABNORMAL HIGH (ref 6–23)
CO2: 27 mEq/L (ref 19–32)
Calcium: 9.2 mg/dL (ref 8.4–10.5)
Chloride: 105 mEq/L (ref 96–112)
Creatinine, Ser: 1.42 mg/dL (ref 0.40–1.50)
GFR: 57.26 mL/min — ABNORMAL LOW (ref >60.00)
Glucose, Bld: 96 mg/dL (ref 70–99)
Potassium: 4.6 mEq/L (ref 3.5–5.1)
Sodium: 137 mEq/L (ref 135–145)
Total Bilirubin: 0.5 mg/dL (ref 0.2–1.2)
Total Protein: 7 g/dL (ref 6.0–8.3)

## 2019-08-05 LAB — CBC WITH DIFFERENTIAL/PLATELET
Basophils Absolute: 0.1 10*3/uL (ref 0.0–0.1)
Basophils Relative: 1.1 % (ref 0.0–3.0)
Eosinophils Absolute: 0.2 10*3/uL (ref 0.0–0.7)
Eosinophils Relative: 4.5 % (ref 0.0–5.0)
HCT: 36.6 % — ABNORMAL LOW (ref 39.0–52.0)
Hemoglobin: 12.3 g/dL — ABNORMAL LOW (ref 13.0–17.0)
Lymphocytes Relative: 34.5 % (ref 12.0–46.0)
Lymphs Abs: 1.8 10*3/uL (ref 0.7–4.0)
MCHC: 33.5 g/dL (ref 30.0–36.0)
MCV: 91.9 fl (ref 78.0–100.0)
Monocytes Absolute: 0.5 10*3/uL (ref 0.1–1.0)
Monocytes Relative: 10.7 % (ref 3.0–12.0)
Neutro Abs: 2.5 10*3/uL (ref 1.4–7.7)
Neutrophils Relative %: 49.2 % (ref 43.0–77.0)
Platelets: 179 10*3/uL (ref 150.0–400.0)
RBC: 3.98 Mil/uL — ABNORMAL LOW (ref 4.22–5.81)
RDW: 15.1 % (ref 11.5–15.5)
WBC: 5.1 10*3/uL (ref 4.0–10.5)

## 2019-08-05 NOTE — Progress Notes (Signed)
Brian [name] is a 86 y.o. male with the following history as recorded in EpicCare:  Patient Active Problem List   Diagnosis Date Noted  . Pain due to onychomycosis of toenails of both feet 07/17/2019  . Type 2 diabetes mellitus with vascular disease ([place]) 07/17/2019  . Left upper arm pain 05/11/2019  . Pulmonary nodules 04/27/2019  . AAA (abdominal aortic aneurysm) ([place]) 01/21/2019  . Nerve pain 10/15/2018  . Low vitamin B12 level 09/30/2018  . LUQ pain 09/30/2018  . Arthritis of carpometacarpal (CMC) joint of thumb 09/30/2018  . Chest pain 01/29/2018  . Decreased hearing of right ear 11/23/2017  . Vertigo 07/17/2016  . Nocturnal leg cramps 09/20/2015  . COPD (chronic obstructive pulmonary disease) ([place]) 05/31/2015  . CKD (chronic kidney disease) stage 3, GFR 30-59 ml/min 05/31/2015  . Diabetes ([place]) 04/26/2015  . AAA (abdominal aortic aneurysm) without rupture ([place]) 01/18/2015  . PAD (peripheral artery disease) ([place]) 01/18/2015  . Hereditary and idiopathic peripheral neuropathy 10/29/2012  . Hypertension 05/10/2011  . CAD (coronary artery disease) 09/05/2010  . TINNITUS 04/04/2010  . CORONARY ARTERY BYPASS GRAFT, HX OF 03/31/2010  . Disturbance in sleep behavior 12/28/2008  . Cervicalgia 07/26/2008  . POLYP, COLON 07/16/2007  . Hyperlipidemia 09/24/2006  . History of gout 09/23/2006    Current Outpatient Medications  Medication Sig Dispense Refill  . ACCU-CHEK COMPACT STRIPS test strip TEST BLOOD SUGAR ONCE DAILY 100 each 1  . ANORO ELLIPTA 62.5-25 MCG/INH AEPB USE 1 INHALATION DAILY 120 each 1  . Aspirin (ADULT ASPIRIN LOW STRENGTH) 81 MG EC tablet Take 81 mg by mouth daily.      . cholecalciferol (VITAMIN D) 1000 UNITS tablet Take 3,000 Units by mouth daily.     . enalapril (VASOTEC) 10 MG tablet TAKE 1 TABLET TWICE A DAY 180 tablet 0  . Magnesium Oxide 420 MG TABS Take 420 mg by mouth daily.     . metoprolol succinate (TOPROL-XL) 25 MG 24 hr tablet TAKE ONE-HALF (1/2)  TABLET DAILY 45 tablet 3  . Multiple Vitamins-Minerals (MULTIVITAL PO) Take 1 tablet by mouth daily.     Vladimir Faster Glycol-Propyl Glycol (SYSTANE) 0.4-0.3 % SOLN Place 1 drop into both eyes daily.    . simvastatin (ZOCOR) 40 MG tablet TAKE 1 TABLET DAILY 90 tablet 3  . temazepam (RESTORIL) 7.5 MG capsule Take 1 capsule (7.5 mg total) by mouth at bedtime as needed. 90 capsule 1  . triamcinolone cream (KENALOG) 0.1 % Apply 1 application topically daily as needed (Rash).     No current facility-administered medications for this visit.    Allergies: Meperidine hcl, Gabapentin, and Zolpidem tartrate  Past Medical History:  Diagnosis Date  . AAA (abdominal aortic aneurysm) ([place])   . Arthritis   . Asthma 1976  . Bronchiolitis May 2016  . CAD (coronary artery disease)    NSTEMI 02/2010;  s/p CABG 1/12 (L-LAD, SOM1, S-PDA); echo in 08/2010: Mild LVH, EF 85%, grade 2 diastolic dysfunction, mild MR, mild LAE, mildly reduced RV function, mild RAE, PASP 42  . Carpal tunnel syndrome of left wrist   . Colon polyps   . COPD (chronic obstructive pulmonary disease) ([place])   . Diabetes mellitus 2000   Type II, controlls with diet  . Dyspnea   . ED (erectile dysfunction)   . Gout   . HTN (hypertension)   . Hyperlipidemia   . Inguinal hernia    Right  . Macular degeneration   . Myocardial infarction ([place])  1 [id number]  . Neuropathy     Past Surgical History:  Procedure Laterality Date  . 3 vessel CABG  03/03/2010  . ABDOMINAL AORTIC ENDOVASCULAR STENT GRAFT N/A 01/21/2019   Procedure: ABDOMINAL AORTIC ENDOVASCULAR STENT GRAFT;  Surgeon: Waynetta Sandy, MD;  Location: [facility];  Service: Vascular;  Laterality: N/A;  . cataract left eye    . COLONOSCOPY     neg 2009  . COLONOSCOPY  2014   5 mm sessile polyp; AVM  . CORONARY ARTERY BYPASS GRAFT  2012   X3  . ENDOVASCULAR REPAIR/STENT GRAFT  01/21/2019   ABDOMINAL AORTIC ENDOVASCULAR STENT GRAFT (N/A )  . EYE SURGERY     Left cataract  .  HERNIA REPAIR    . INCISION AND DRAINAGE PERIRECTAL ABSCESS    . INGUINAL HERNIA REPAIR      Family History  Problem Relation Age of Onset  . Heart failure Mother   . Heart disease Mother        CHF  . Heart attack Mother   . Hypertension Mother   . Heart attack Father   . Heart disease Father 49       MI  . Hypertension Father   . Heart attack Sister 30  . Heart disease Sister 24       MI  . Diabetes Sister   . Hyperlipidemia Sister   . Hypertension Sister   . Varicose Veins Sister   . Heart attack Brother 8  . Diabetes Brother   . Heart disease Brother 69       MI  . Cancer Brother   . Hypertension Brother   . Deep vein thrombosis Daughter   . Other Son        varicose veins  . Colon cancer Neg Hx     Social History   Tobacco Use  . Smoking status: Former Smoker    Packs/day: 1.00    Years: 40.00    Pack years: 40.00    Types: Cigarettes    Quit date: 02/20/2003    Years since quitting: 16.4  . Smokeless tobacco: Never Used  Substance Use Topics  . Alcohol use: Not Currently    Alcohol/week: 6.0 standard drinks    Types: 3 Glasses of wine, 3 Shots of liquor per week    Comment:  1-3 drinks a week    Subjective:  Patient was concerned that his stool appeared "dark" on Sunday and Monday; notes that today he had a bowel movement which appeared "back to normal." No abdominal pain, no fever, no vomiting; no unexplained shortness of breath ( known COPD), no dizziness or light headedness;  Weight is down 4 pounds today- he is actively working on gaining weight;    Objective:  Vitals:   08/05/19 1040  BP: 130/64  Pulse: 61  Temp: 97.9 F (36.6 C)  TempSrc: Oral  SpO2: 90%  Weight: 163 lb 12.8 oz (74.3 kg)    General: Well developed, well nourished, in no acute distress  Skin : Warm and dry.  Head: Normocephalic and atraumatic  Lungs: Respirations unlabored; clear to auscultation bilaterally without wheeze, rales, rhonchi  CVS exam: normal rate and regular  rhythm.  Abdomen: Soft; nontender; nondistended; normoactive bowel sounds; no masses or hepatosplenomegaly  Neurologic: Alert and oriented; speech intact; face symmetrical; moves all extremities well; CNII-XII intact without focal deficit   Assessment:  1. Dark stools   2. Weight loss, unintentional     Plan:  Per patient,  symptoms have normalized; low suspicion for GI bleeding based on description of symptoms; will update labs today to include CBC, CMP, anemia panel; follow-up to be determined; Patient is encouraged to work on extra caloric intake- peanut butter, nuts, Ensure, yogurt; keep planned follow up with his PCP for 09/2019- if unable to regain weight, further testing may need to be done.  This visit occurred during the SARS-CoV-2 public health emergency.  Safety protocols were in place, including screening questions prior to the visit, additional usage of staff PPE, and extensive cleaning of exam room while observing appropriate contact time as indicated for disinfecting solutions.     No follow-ups on file.  Orders Placed This Encounter  Procedures  . CBC with Differential/Platelet  . Iron, TIBC and Ferritin Panel  . Comp Met (CMET)    Requested Prescriptions    No prescriptions requested or ordered in this encounter

## 2019-08-06 LAB — IRON,TIBC AND FERRITIN PANEL
%SAT: 18 % (calc) — ABNORMAL LOW (ref 20–48)
Ferritin: 35 ng/mL (ref 24–380)
Iron: 63 ug/dL (ref 50–180)
TIBC: 350 mcg/dL (calc) (ref 250–425)

## 2019-08-10 ENCOUNTER — Telehealth: Payer: Self-pay

## 2019-08-10 NOTE — Telephone Encounter (Signed)
New message    Calling for test results   Can leave a voicemail

## 2019-08-10 NOTE — Telephone Encounter (Signed)
Called and left message for patient. My-chart message states he read his message on 08/07/19 so not sure what additional questions he has as his labs looked good.

## 2019-09-09 ENCOUNTER — Other Ambulatory Visit: Payer: Self-pay | Admitting: Internal Medicine

## 2019-10-14 NOTE — Progress Notes (Signed)
Subjective:    Patient ID: Brian [name], male    DOB: Sep 24, 1931, 86 y.o.   MRN: [id number]  HPI The patient is here for follow up of their chronic medical problems, including htn, DM, hyperlipidemia, sleep difficulties  COPD, pulm nodules ( sees Dr Lamonte Sakai) - qualities for supplemental oxygen.  B/l upper lung nodules - ? Primary lung cancer - minimal if any change - will be followed up in 6 mo.    His daughter died recently from leukemia.  He has been having intermittent panic attacks.  He becomes afraid at times when he is alone.  This does not happen when he is with a friend..    His right ear is blocked - he thinks it has wax in it.  He did try putting in ear wax drops.   His right thumb locks up.  It is painful   Decreased appetite.  Drinking ensure.  His sleep is still not great - he only takes the sleep medication if he really needs it.     Medications and allergies reviewed with patient and updated if appropriate.  Patient Active Problem List   Diagnosis Date Noted  . Pain due to onychomycosis of toenails of both feet 07/17/2019  . Type 2 diabetes mellitus with vascular disease ([place]) 07/17/2019  . Left upper arm pain 05/11/2019  . Pulmonary nodules 04/27/2019  . AAA (abdominal aortic aneurysm) ([place]) 01/21/2019  . Nerve pain 10/15/2018  . Low vitamin B12 level 09/30/2018  . LUQ pain 09/30/2018  . Arthritis of carpometacarpal (CMC) joint of thumb 09/30/2018  . Chest pain 01/29/2018  . Decreased hearing of right ear 11/23/2017  . Vertigo 07/17/2016  . Nocturnal leg cramps 09/20/2015  . COPD (chronic obstructive pulmonary disease) ([place]) 05/31/2015  . CKD (chronic kidney disease) stage 3, GFR 30-59 ml/min 05/31/2015  . Diabetes ([place]) 04/26/2015  . AAA (abdominal aortic aneurysm) without rupture ([place]) 01/18/2015  . PAD (peripheral artery disease) ([place]) 01/18/2015  . Hereditary and idiopathic peripheral neuropathy 10/29/2012  . Hypertension 05/10/2011  . CAD  (coronary artery disease) 09/05/2010  . TINNITUS 04/04/2010  . CORONARY ARTERY BYPASS GRAFT, HX OF 03/31/2010  . Disturbance in sleep behavior 12/28/2008  . Cervicalgia 07/26/2008  . POLYP, COLON 07/16/2007  . Hyperlipidemia 09/24/2006  . History of gout 09/23/2006    Current Outpatient Medications on File Prior to Visit  Medication Sig Dispense Refill  . ACCU-CHEK COMPACT STRIPS test strip TEST BLOOD SUGAR ONCE DAILY 100 each 1  . ANORO ELLIPTA 62.5-25 MCG/INH AEPB USE 1 INHALATION DAILY 120 each 1  . Aspirin (ADULT ASPIRIN LOW STRENGTH) 81 MG EC tablet Take 81 mg by mouth daily.      . cholecalciferol (VITAMIN D) 1000 UNITS tablet Take 3,000 Units by mouth daily.     . enalapril (VASOTEC) 10 MG tablet TAKE 1 TABLET TWICE A DAY 180 tablet 2  . Magnesium Oxide 420 MG TABS Take 420 mg by mouth daily.     . metoprolol succinate (TOPROL-XL) 25 MG 24 hr tablet TAKE ONE-HALF (1/2) TABLET DAILY 45 tablet 3  . Multiple Vitamins-Minerals (MULTIVITAL PO) Take 1 tablet by mouth daily.     Vladimir Faster Glycol-Propyl Glycol (SYSTANE) 0.4-0.3 % SOLN Place 1 drop into both eyes daily.    . simvastatin (ZOCOR) 40 MG tablet TAKE 1 TABLET DAILY 90 tablet 3  . temazepam (RESTORIL) 7.5 MG capsule Take 1 capsule (7.5 mg total) by mouth at bedtime as needed. 90 capsule  1  . triamcinolone cream (KENALOG) 0.1 % Apply 1 application topically daily as needed (Rash).     No current facility-administered medications on file prior to visit.    Past Medical History:  Diagnosis Date  . AAA (abdominal aortic aneurysm) ([place])   . Arthritis   . Asthma 1976  . Bronchiolitis May 2016  . CAD (coronary artery disease)    NSTEMI 02/2010;  s/p CABG 1/12 (L-LAD, SOM1, S-PDA); echo in 08/2010: Mild LVH, EF 07%, grade 2 diastolic dysfunction, mild MR, mild LAE, mildly reduced RV function, mild RAE, PASP 42  . Carpal tunnel syndrome of left wrist   . Colon polyps   . COPD (chronic obstructive pulmonary disease) ([place])   .  Diabetes mellitus 2000   Type II, controlls with diet  . Dyspnea   . ED (erectile dysfunction)   . Gout   . HTN (hypertension)   . Hyperlipidemia   . Inguinal hernia    Right  . Macular degeneration   . Myocardial infarction ([place]) 1 -13-2012  . Neuropathy     Past Surgical History:  Procedure Laterality Date  . 3 vessel CABG  03/03/2010  . ABDOMINAL AORTIC ENDOVASCULAR STENT GRAFT N/A 01/21/2019   Procedure: ABDOMINAL AORTIC ENDOVASCULAR STENT GRAFT;  Surgeon: Waynetta Sandy, MD;  Location: [facility];  Service: Vascular;  Laterality: N/A;  . cataract left eye    . COLONOSCOPY     neg 2009  . COLONOSCOPY  2014   5 mm sessile polyp; AVM  . CORONARY ARTERY BYPASS GRAFT  2012   X3  . ENDOVASCULAR REPAIR/STENT GRAFT  01/21/2019   ABDOMINAL AORTIC ENDOVASCULAR STENT GRAFT (N/A )  . EYE SURGERY     Left cataract  . HERNIA REPAIR    . INCISION AND DRAINAGE PERIRECTAL ABSCESS    . INGUINAL HERNIA REPAIR      Social History   Socioeconomic History  . Marital status: Widowed    Spouse name: Not on file  . Number of children: 3  . Years of education: 38  . Highest education level: Not on file  Occupational History  . Occupation: RETIRED    Employer: RETIRED  Tobacco Use  . Smoking status: Former Smoker    Packs/day: 1.00    Years: 40.00    Pack years: 40.00    Types: Cigarettes    Quit date: 02/20/2003    Years since quitting: 16.6  . Smokeless tobacco: Never Used  Vaping Use  . Vaping Use: Never used  Substance and Sexual Activity  . Alcohol use: Not Currently    Alcohol/week: 6.0 standard drinks    Types: 3 Glasses of wine, 3 Shots of liquor per week    Comment:  1-3 drinks a week  . Drug use: No  . Sexual activity: Not on file  Other Topics Concern  . 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    One story home   Right handed   3 children   Social Determinants of Health   Financial  Resource Strain:   . Difficulty of Paying Living Expenses: Not on file  Food Insecurity:   . Worried About Charity fundraiser in the Last Year: Not on file  . Ran Out of Food in the Last Year: Not on file  Transportation Needs:   . Lack of Transportation (Medical): Not on file  . Lack of Transportation (Non-Medical): Not on  file  Physical Activity:   . Days of Exercise per Week: Not on file  . Minutes of Exercise per Session: Not on file  Stress:   . Feeling of Stress : Not on file  Social Connections:   . Frequency of Communication with Friends and Family: Not on file  . Frequency of Social Gatherings with Friends and Family: Not on file  . Attends Religious Services: Not on file  . Active Member of Clubs or Organizations: Not on file  . Attends Archivist Meetings: Not on file  . Marital Status: Not on file    Family History  Problem Relation Age of Onset  . Heart failure Mother   . Heart disease Mother        CHF  . Heart attack Mother   . Hypertension Mother   . Heart attack Father   . Heart disease Father 75       MI  . Hypertension Father   . Heart attack Sister 84  . Heart disease Sister 61       MI  . Diabetes Sister   . Hyperlipidemia Sister   . Hypertension Sister   . Varicose Veins Sister   . Heart attack Brother 66  . Diabetes Brother   . Heart disease Brother 65       MI  . Cancer Brother   . Hypertension Brother   . Deep vein thrombosis Daughter   . Other Son        varicose veins  . Colon cancer Neg Hx     Review of Systems  Constitutional: Positive for appetite change (decreased).  Respiratory: Positive for shortness of breath (more with bending, occ w exertion) and wheezing (rare). Negative for cough.   Cardiovascular: Negative for chest pain, palpitations and leg swelling.  Musculoskeletal: Positive for arthralgias.       Cramps in ankles at times  Neurological: Negative for light-headedness and headaches.    Psychiatric/Behavioral: Positive for sleep disturbance (chronic, but worse). The patient is nervous/anxious.        Feels a load on him, feels afraid at times - "panic attack"       Objective:   Vitals:   10/15/19 0917  BP: 126/78  Pulse: 81  Temp: 98.1 F (36.7 C)  SpO2: 93%   BP Readings from Last 3 Encounters:  10/15/19 126/78  08/05/19 130/64  07/09/19 (!) 146/72   Wt Readings from Last 3 Encounters:  10/15/19 160 lb (72.6 kg)  08/05/19 163 lb 12.8 oz (74.3 kg)  07/09/19 167 lb 3.2 oz (75.8 kg)   Body mass index is 22.32 kg/m.   Physical Exam    Constitutional: Appears well-developed and well-nourished. No distress.  HENT:  Head: Normocephalic and atraumatic.  Neck: Neck supple. No tracheal deviation present. No thyromegaly present.  No cervical lymphadenopathy Cardiovascular: Normal rate, regular rhythm and normal heart sounds.   No murmur heard. No carotid bruit .  No edema Pulmonary/Chest: Effort normal and breath sounds normal. No respiratory distress. No has no wheezes. No rales.  Skin: Skin is warm and dry. Not diaphoretic.  Psychiatric: Normal mood and affect. Behavior is normal.      Assessment & Plan:    See Problem List for Assessment and Plan of chronic medical problems.    This visit occurred during the SARS-CoV-2 public health emergency.  Safety protocols were in place, including screening questions prior to the visit, additional usage of staff PPE, and extensive cleaning of  exam room while observing appropriate contact time as indicated for disinfecting solutions.

## 2019-10-14 NOTE — Patient Instructions (Addendum)
  Blood work was ordered.     Medications reviewed and updated.  Changes include :   remeron 15 mg at bedtime   Your prescription(s) have been submitted to your pharmacy. Please take as directed and contact our office if you believe you are having problem(s) with the medication(s).    Please followup in 6 months

## 2019-10-15 ENCOUNTER — Other Ambulatory Visit: Payer: Self-pay

## 2019-10-15 ENCOUNTER — Ambulatory Visit (INDEPENDENT_AMBULATORY_CARE_PROVIDER_SITE_OTHER): Payer: Medicare Other | Admitting: Internal Medicine

## 2019-10-15 ENCOUNTER — Encounter: Payer: Self-pay | Admitting: Internal Medicine

## 2019-10-15 ENCOUNTER — Ambulatory Visit: Payer: Medicare Other

## 2019-10-15 VITALS — BP 126/78 | HR 81 | Temp 98.1°F | Ht 71.0 in | Wt 160.0 lb

## 2019-10-15 DIAGNOSIS — E1159 Type 2 diabetes mellitus with other circulatory complications: Secondary | ICD-10-CM | POA: Diagnosis not present

## 2019-10-15 DIAGNOSIS — F419 Anxiety disorder, unspecified: Secondary | ICD-10-CM

## 2019-10-15 DIAGNOSIS — I10 Essential (primary) hypertension: Secondary | ICD-10-CM

## 2019-10-15 DIAGNOSIS — E1122 Type 2 diabetes mellitus with diabetic chronic kidney disease: Secondary | ICD-10-CM | POA: Diagnosis not present

## 2019-10-15 DIAGNOSIS — E782 Mixed hyperlipidemia: Secondary | ICD-10-CM

## 2019-10-15 DIAGNOSIS — N182 Chronic kidney disease, stage 2 (mild): Secondary | ICD-10-CM

## 2019-10-15 DIAGNOSIS — G479 Sleep disorder, unspecified: Secondary | ICD-10-CM

## 2019-10-15 DIAGNOSIS — M65311 Trigger thumb, right thumb: Secondary | ICD-10-CM

## 2019-10-15 MED ORDER — MIRTAZAPINE 15 MG PO TABS
15.0000 mg | ORAL_TABLET | Freq: Every day | ORAL | 5 refills | Status: DC
Start: 2019-10-15 — End: 2020-04-20

## 2019-10-15 NOTE — Assessment & Plan Note (Signed)
Acute Having panic attacks since the death of his daughter Has sensation of being afraid Will try remeron for poor sleep, anxiety/panic attacks and decreased appetite He will let me know if this does not help so we can try something else

## 2019-10-15 NOTE — Assessment & Plan Note (Signed)
Chronic Takes temazepam prn only Will start remeron for sleep, anxiety/panic and decreased appetite

## 2019-10-15 NOTE — Assessment & Plan Note (Signed)
Acute Deferred referral at this time

## 2019-10-15 NOTE — Assessment & Plan Note (Signed)
Chronic Check a1c Low sugar / carb diet Stressed regular exercise  

## 2019-10-15 NOTE — Assessment & Plan Note (Signed)
Chronic Check lipid panel  Continue daily statin Regular exercise and healthy diet encouraged  

## 2019-10-15 NOTE — Assessment & Plan Note (Signed)
Chronic BP well controlled Current regimen effective and well tolerated Continue current medications at current doses cmp  

## 2019-10-16 LAB — COMPLETE METABOLIC PANEL WITH GFR
AG Ratio: 1.6 (calc) (ref 1.0–2.5)
ALT: 10 U/L (ref 9–46)
AST: 15 U/L (ref 10–35)
Albumin: 4.2 g/dL (ref 3.6–5.1)
Alkaline phosphatase (APISO): 57 U/L (ref 35–144)
BUN/Creatinine Ratio: 14 (calc) (ref 6–22)
BUN: 22 mg/dL (ref 7–25)
CO2: 26 mmol/L (ref 20–32)
Calcium: 9.5 mg/dL (ref 8.6–10.3)
Chloride: 104 mmol/L (ref 98–110)
Creat: 1.58 mg/dL — ABNORMAL HIGH (ref 0.70–1.11)
GFR, Est African American: 46 mL/min/{1.73_m2} — ABNORMAL LOW (ref >=60)
GFR, Est Non African American: 39 mL/min/{1.73_m2} — ABNORMAL LOW (ref >=60)
Globulin: 2.6 g/dL (calc) (ref 1.9–3.7)
Glucose, Bld: 103 mg/dL — ABNORMAL HIGH (ref 65–99)
Potassium: 4.9 mmol/L (ref 3.5–5.3)
Sodium: 138 mmol/L (ref 135–146)
Total Bilirubin: 0.6 mg/dL (ref 0.2–1.2)
Total Protein: 6.8 g/dL (ref 6.1–8.1)

## 2019-10-16 LAB — HEMOGLOBIN A1C
Hgb A1c MFr Bld: 6.2 % of total Hgb — ABNORMAL HIGH (ref <5.7)
Mean Plasma Glucose: 131 (calc)
eAG (mmol/L): 7.3 (calc)

## 2019-10-16 LAB — CBC WITH DIFFERENTIAL/PLATELET
Absolute Monocytes: 550 cells/uL (ref 200–950)
Basophils Absolute: 30 cells/uL (ref 0–200)
Basophils Relative: 0.6 %
Eosinophils Absolute: 110 cells/uL (ref 15–500)
Eosinophils Relative: 2.2 %
HCT: 40.9 % (ref 38.5–50.0)
Hemoglobin: 13.5 g/dL (ref 13.2–17.1)
Lymphs Abs: 1555 cells/uL (ref 850–3900)
MCH: 31 pg (ref 27.0–33.0)
MCHC: 33 g/dL (ref 32.0–36.0)
MCV: 94 fL (ref 80.0–100.0)
MPV: 10.6 fL (ref 7.5–12.5)
Monocytes Relative: 11 %
Neutro Abs: 2755 cells/uL (ref 1500–7800)
Neutrophils Relative %: 55.1 %
Platelets: 185 10*3/uL (ref 140–400)
RBC: 4.35 10*6/uL (ref 4.20–5.80)
RDW: 12.2 % (ref 11.0–15.0)
Total Lymphocyte: 31.1 %
WBC: 5 10*3/uL (ref 3.8–10.8)

## 2019-10-21 ENCOUNTER — Other Ambulatory Visit: Payer: Self-pay

## 2019-10-21 ENCOUNTER — Ambulatory Visit (INDEPENDENT_AMBULATORY_CARE_PROVIDER_SITE_OTHER): Payer: Medicare Other | Admitting: Podiatry

## 2019-10-21 ENCOUNTER — Encounter: Payer: Self-pay | Admitting: Podiatry

## 2019-10-21 DIAGNOSIS — B351 Tinea unguium: Secondary | ICD-10-CM | POA: Diagnosis not present

## 2019-10-21 DIAGNOSIS — E1159 Type 2 diabetes mellitus with other circulatory complications: Secondary | ICD-10-CM | POA: Diagnosis not present

## 2019-10-21 DIAGNOSIS — I739 Peripheral vascular disease, unspecified: Secondary | ICD-10-CM

## 2019-10-21 DIAGNOSIS — M79675 Pain in left toe(s): Secondary | ICD-10-CM

## 2019-10-21 DIAGNOSIS — M79674 Pain in right toe(s): Secondary | ICD-10-CM

## 2019-10-21 DIAGNOSIS — N183 Chronic kidney disease, stage 3 unspecified: Secondary | ICD-10-CM

## 2019-10-21 NOTE — Progress Notes (Signed)
This patient returns to my office for at risk foot care.  This patient requires this care by a professional since this patient will be at risk due to having chronic kidney disease and diabetes.  This patient is unable to cut nails himself since the patient cannot reach his nails.These nails are painful walking and wearing shoes.  This patient presents for at risk foot care today.  General Appearance  Alert, conversant and in no acute stress.  Vascular  Dorsalis pedis and posterior tibial  pulses are absent   bilaterally.  Capillary return is within normal limits  bilaterally. Temperature is within normal limits  Bilaterally. Venous stasis  Left leg/foot.  Neurologic  Senn-Weinstein monofilament wire test within normal limits  bilaterally. Muscle power within normal limits bilaterally.  Nails Thick disfigured discolored nails with subungual debris  from hallux to fifth toes bilaterally. No evidence of bacterial infection or drainage bilaterally.  Orthopedic  No limitations of motion  feet .  No crepitus or effusions noted.  No bony pathology or digital deformities noted.  HAV  B/L.  Skin  normotropic skin with no porokeratosis noted bilaterally.  No signs of infections or ulcers noted.     Onychomycosis  Pain in right toes  Pain in left toes  Consent was obtained for treatment procedures.   Mechanical debridement of nails 1-5  bilaterally performed with a nail nipper.  Filed with dremel without incident.    Return office visit  3 months                    Told patient to return for periodic foot care and evaluation due to potential at risk complications.   Gardiner Barefoot DPM

## 2019-11-25 NOTE — Progress Notes (Signed)
Chief Complaint  Patient presents with  . Follow-up    CAD   History of Present Illness: 86 yo male with history of HTN, hyperlipidemia, DM, COPD and CAD s/p CABG here today for cardiac follow up. He was admitted to [facility] January 2012 with a NSTEMI. Cardiac cath January 2012 with severe triple vessel CAD. He underwent 3V CABG on 03/03/10. AAA followed in [facility].  Echo March 2017 with normal LV size and function, mild MR.   He is here today for follow up. The patient denies any chest pain, dyspnea, palpitations, lower extremity edema, orthopnea, PND, dizziness, near syncope or syncope.   Primary Care Physician: Binnie Rail, MD  Past Medical History:  Diagnosis Date  . AAA (abdominal aortic aneurysm) ([place])   . Arthritis   . Asthma 1976  . Bronchiolitis May 2016  . CAD (coronary artery disease)    NSTEMI 02/2010;  s/p CABG 1/12 (L-LAD, SOM1, S-PDA); echo in 08/2010: Mild LVH, EF 50%, grade 2 diastolic dysfunction, mild MR, mild LAE, mildly reduced RV function, mild RAE, PASP 42  . Carpal tunnel syndrome of left wrist   . Colon polyps   . COPD (chronic obstructive pulmonary disease) ([place])   . Diabetes mellitus 2000   Type II, controlls with diet  . Dyspnea   . ED (erectile dysfunction)   . Gout   . HTN (hypertension)   . Hyperlipidemia   . Inguinal hernia    Right  . Macular degeneration   . Myocardial infarction ([place]) 1 -13-2012  . Neuropathy     Past Surgical History:  Procedure Laterality Date  . 3 vessel CABG  03/03/2010  . ABDOMINAL AORTIC ENDOVASCULAR STENT GRAFT N/A 01/21/2019   Procedure: ABDOMINAL AORTIC ENDOVASCULAR STENT GRAFT;  Surgeon: Waynetta Sandy, MD;  Location: [facility];  Service: Vascular;  Laterality: N/A;  . cataract left eye    . COLONOSCOPY     neg 2009  . COLONOSCOPY  2014   5 mm sessile polyp; AVM  . CORONARY ARTERY BYPASS GRAFT  2012   X3  . ENDOVASCULAR REPAIR/STENT GRAFT  01/21/2019   ABDOMINAL AORTIC ENDOVASCULAR STENT  GRAFT (N/A )  . EYE SURGERY     Left cataract  . HERNIA REPAIR    . INCISION AND DRAINAGE PERIRECTAL ABSCESS    . INGUINAL HERNIA REPAIR      Current Outpatient Medications  Medication Sig Dispense Refill  . ACCU-CHEK COMPACT STRIPS test strip TEST BLOOD SUGAR ONCE DAILY 100 each 1  . ANORO ELLIPTA 62.5-25 MCG/INH AEPB USE 1 INHALATION DAILY 120 each 1  . Aspirin (ADULT ASPIRIN LOW STRENGTH) 81 MG EC tablet Take 81 mg by mouth daily.      . cholecalciferol (VITAMIN D) 1000 UNITS tablet Take 3,000 Units by mouth daily.     . enalapril (VASOTEC) 10 MG tablet TAKE 1 TABLET TWICE A DAY 180 tablet 2  . Magnesium Oxide 420 MG TABS Take 420 mg by mouth daily.     . metoprolol succinate (TOPROL-XL) 25 MG 24 hr tablet TAKE ONE-HALF (1/2) TABLET DAILY 45 tablet 3  . mirtazapine (REMERON) 15 MG tablet Take 1 tablet (15 mg total) by mouth at bedtime. 30 tablet 5  . Multiple Vitamins-Minerals (MULTIVITAL PO) Take 1 tablet by mouth daily.     Vladimir Faster Glycol-Propyl Glycol (SYSTANE) 0.4-0.3 % SOLN Place 1 drop into both eyes daily.    . simvastatin (ZOCOR) 40 MG tablet TAKE 1 TABLET DAILY  90 tablet 3  . temazepam (RESTORIL) 7.5 MG capsule Take 1 capsule (7.5 mg total) by mouth at bedtime as needed. 90 capsule 1  . triamcinolone cream (KENALOG) 0.1 % Apply 1 application topically daily as needed (Rash).     No current facility-administered medications for this visit.    Allergies  Allergen Reactions  . Meperidine Hcl     Tongue swelling Because of a history of documented adverse serious drug reaction;Medi Alert bracelet  is recommended  . Gabapentin     confusion  . Zolpidem Tartrate Other (See Comments)    Nightmares    Social History   Socioeconomic History  . Marital status: Widowed    Spouse name: Not on file  . Number of children: 3  . Years of education: 23  . Highest education level: Not on file  Occupational History  . Occupation: RETIRED    Employer: RETIRED  Tobacco Use    . Smoking status: Former Smoker    Packs/day: 1.00    Years: 40.00    Pack years: 40.00    Types: Cigarettes    Quit date: 02/20/2003    Years since quitting: 16.7  . Smokeless tobacco: Never Used  Vaping Use  . Vaping Use: Never used  Substance and Sexual Activity  . Alcohol use: Not Currently    Alcohol/week: 6.0 standard drinks    Types: 3 Glasses of wine, 3 Shots of liquor per week    Comment:  1-3 drinks a week  . Drug use: No  . Sexual activity: Not on file  Other Topics Concern  . 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    One story home   Right handed   3 children   Social Determinants of Health   Financial Resource Strain:   . Difficulty of Paying Living Expenses: Not on file  Food Insecurity:   . Worried About Charity fundraiser in the Last Year: Not on file  . Ran Out of Food in the Last Year: Not on file  Transportation Needs:   . Lack of Transportation (Medical): Not on file  . Lack of Transportation (Non-Medical): Not on file  Physical Activity:   . Days of Exercise per Week: Not on file  . Minutes of Exercise per Session: Not on file  Stress:   . Feeling of Stress : Not on file  Social Connections:   . Frequency of Communication with Friends and Family: Not on file  . Frequency of Social Gatherings with Friends and Family: Not on file  . Attends Religious Services: Not on file  . Active Member of Clubs or Organizations: Not on file  . Attends Archivist Meetings: Not on file  . Marital Status: Not on file  Intimate Partner Violence:   . Fear of Current or Ex-Partner: Not on file  . Emotionally Abused: Not on file  . Physically Abused: Not on file  . Sexually Abused: Not on file    Family History  Problem Relation Age of Onset  . Heart failure Mother   . Heart disease Mother        CHF  . Heart attack Mother   . Hypertension Mother   . Heart attack Father   . Heart  disease Father 42       MI  . Hypertension Father   . Heart attack Sister 1  . Heart disease Sister 60  MI  . Diabetes Sister   . Hyperlipidemia Sister   . Hypertension Sister   . Varicose Veins Sister   . Heart attack Brother 64  . Diabetes Brother   . Heart disease Brother 71       MI  . Cancer Brother   . Hypertension Brother   . Deep vein thrombosis Daughter   . Other Son        varicose veins  . Colon cancer Neg Hx     Review of Systems:  As stated in the HPI and otherwise negative.   BP (!) 144/78   Pulse (!) 59   Ht 5\' 11"  (1.803 m)   Wt 162 lb 3.2 oz (73.6 kg)   SpO2 91%   BMI 22.62 kg/m   Physical Examination:  General: Well developed, well nourished, NAD  HEENT: OP clear, mucus membranes moist  SKIN: warm, dry. No rashes. Neuro: No focal deficits  Musculoskeletal: Muscle strength 5/5 all ext  Psychiatric: Mood and affect normal  Neck: No JVD, no carotid bruits, no thyromegaly, no lymphadenopathy.  Lungs:Clear bilaterally, no wheezes, rhonci, crackles Cardiovascular: Regular rate and rhythm. No murmurs, gallops or rubs. Abdomen:Soft. Bowel sounds present. Non-tender.  Extremities: No lower extremity edema. Pulses are 2 + in the bilateral DP/PT.  Echo March 2017: Left ventricle: The cavity size was normal. There was mild focal   basal hypertrophy of the septum. Systolic function was normal.   The estimated ejection fraction was in the range of 55% to 60%.   Wall motion was normal; there were no regional wall motion   abnormalities. Doppler parameters are consistent with abnormal   left ventricular relaxation (grade 1 diastolic dysfunction).   Doppler parameters are consistent with high ventricular filling   pressure. - Mitral valve: There was mild regurgitation. - Right ventricle: The cavity size was mildly dilated. - Right atrium: The atrium was mildly dilated.  Impressions:  - Normal LV systolic function; grade 1 diastolic dysfunction;  mild   MR; mild RAE and RVE; trace TR.  EKG:  EKG is  ordered today. The ekg ordered today demonstrates Sinus, 1st degree AV block, RBBB, LAFB. There appears to be a a period of Wenckebach vs blocked PACs.   Recent Labs: 01/21/2019: Magnesium 1.8 10/15/2019: ALT 10; BUN 22; Creat 1.58; Hemoglobin 13.5; Platelets 185; Potassium 4.9; Sodium 138   Lipid Panel    Component Value Date/Time   CHOL 138 03/18/2019 0000   TRIG 73 03/18/2019 0000   HDL 53 03/18/2019 0000   CHOLHDL 2 09/30/2018 0947   VLDL 17.0 09/30/2018 0947   LDLCALC 70 03/18/2019 0000     Wt Readings from Last 3 Encounters:  11/26/19 162 lb 3.2 oz (73.6 kg)  10/15/19 160 lb (72.6 kg)  08/05/19 163 lb 12.8 oz (74.3 kg)     Other studies Reviewed: Additional studies/ records that were reviewed today include: . Review of the above records demonstrates:    Assessment and Plan:   1. CAD s/p CABG without angina: No chest pain. Continue ASA, statin and beta blocker.       2. HTN: BP is well controlled. Continue current meds.   3. Hyperlipidemia: LDL at goal in January 2021. Continue statin  Current medicines are reviewed at length with the patient today.  The patient does not have concerns regarding medicines.  The following changes have been made:  no change  Labs/ tests ordered today include:   Orders Placed This Encounter  Procedures  .  EKG 12-Lead    Disposition:   FU with me in 12  months  Signed, Lauree Chandler, MD 11/26/2019 11:20 AM    [facility] [address], [place], [place]  [address] Phone: [phone]; Fax: [phone]

## 2019-11-26 ENCOUNTER — Other Ambulatory Visit: Payer: Self-pay

## 2019-11-26 ENCOUNTER — Encounter: Payer: Self-pay | Admitting: Cardiovascular Disease

## 2019-11-26 ENCOUNTER — Ambulatory Visit (INDEPENDENT_AMBULATORY_CARE_PROVIDER_SITE_OTHER): Payer: Medicare Other | Admitting: Cardiovascular Disease

## 2019-11-26 VITALS — BP 144/78 | HR 59 | Ht 71.0 in | Wt 162.2 lb

## 2019-11-26 DIAGNOSIS — I10 Essential (primary) hypertension: Secondary | ICD-10-CM | POA: Diagnosis not present

## 2019-11-26 DIAGNOSIS — I2510 Atherosclerotic heart disease of native coronary artery without angina pectoris: Secondary | ICD-10-CM

## 2019-11-26 NOTE — Patient Instructions (Addendum)
  Medication Instructions:  Your physician recommends that you continue on your current medications as directed. Please refer to the Current Medication list given to you today.  Labwork: None ordered.  Testing/Procedures: None ordered.  Follow-Up: Your physician recommends that you schedule a follow-up appointment in:   12 months with Dr. Angelena Form  Any Other Special Instructions Will Be Listed Below (If Applicable).     If you need a refill on your cardiac medications before your next appointment, please call your pharmacy.

## 2019-12-22 ENCOUNTER — Other Ambulatory Visit: Payer: Self-pay

## 2019-12-22 ENCOUNTER — Telehealth: Payer: Self-pay | Admitting: Internal Medicine

## 2019-12-22 MED ORDER — ANORO ELLIPTA 62.5-25 MCG/INH IN AEPB: INHALATION_SPRAY | RESPIRATORY_TRACT | 2 refills | Status: DC

## 2019-12-22 NOTE — Telephone Encounter (Signed)
Sent in today 

## 2019-12-22 NOTE — Telephone Encounter (Signed)
Patient is requesting a med refill for [facility] ELLIPTA 62.5-25 MCG/INH AEPB  It can be sent to [facility], [place]   Please call the patient when rx is done: [phone]

## 2019-12-25 ENCOUNTER — Ambulatory Visit (INDEPENDENT_AMBULATORY_CARE_PROVIDER_SITE_OTHER)
Admission: RE | Admit: 2019-12-25 | Discharge: 2019-12-25 | Disposition: A | Discharge status: Home / Self Care | Payer: Medicare Other | Source: Ambulatory Visit | Attending: Emergency Medicine | Admitting: Emergency Medicine

## 2019-12-25 ENCOUNTER — Other Ambulatory Visit: Payer: Self-pay

## 2019-12-25 DIAGNOSIS — R918 Other nonspecific abnormal finding of lung field: Secondary | ICD-10-CM

## 2019-12-25 DIAGNOSIS — I2510 Atherosclerotic heart disease of native coronary artery without angina pectoris: Secondary | ICD-10-CM | POA: Diagnosis not present

## 2019-12-25 DIAGNOSIS — I728 Aneurysm of other specified arteries: Secondary | ICD-10-CM | POA: Diagnosis not present

## 2019-12-25 DIAGNOSIS — J432 Centrilobular emphysema: Secondary | ICD-10-CM | POA: Diagnosis not present

## 2019-12-25 DIAGNOSIS — I281 Aneurysm of pulmonary artery: Secondary | ICD-10-CM | POA: Diagnosis not present

## 2020-01-11 ENCOUNTER — Ambulatory Visit (INDEPENDENT_AMBULATORY_CARE_PROVIDER_SITE_OTHER): Payer: Medicare Other | Admitting: Emergency Medicine

## 2020-01-11 ENCOUNTER — Other Ambulatory Visit: Payer: Self-pay

## 2020-01-11 ENCOUNTER — Encounter: Payer: Self-pay | Admitting: Emergency Medicine

## 2020-01-11 DIAGNOSIS — J439 Emphysema, unspecified: Secondary | ICD-10-CM

## 2020-01-11 DIAGNOSIS — R918 Other nonspecific abnormal finding of lung field: Secondary | ICD-10-CM

## 2020-01-11 NOTE — Assessment & Plan Note (Signed)
Plan to continue current [place].  No flares

## 2020-01-11 NOTE — Addendum Note (Signed)
Addended by: Gavin Potters R on: 01/11/2020 04:56 PM   Modules accepted: Orders

## 2020-01-11 NOTE — Patient Instructions (Signed)
We will arrange for a PET scan to better evaluate your pulmonary nodule Depending on the PET scan results we will discuss whether we should consider referral to radiation oncology to discuss radiation treatment.  Alternatively we may discuss following with serial CT scans or even bronchoscopy. Follow with Dr Lamonte Sakai in 1 month

## 2020-01-11 NOTE — Progress Notes (Signed)
Subjective:    Patient ID: Brian [name], male    DOB: 07/06/1931, 86 y.o.   MRN: [id number]  HPI 86 year old former smoker (40 pack years) with a history of aortic aneurysm and repair from 01/2019, CAD/CABG, diabetes, hypertension.  He also carries a history of COPD that was made.  PFT from 04/01/2015 reviewed by me showed severe obstruction with a positive bronchodilator response, hyperinflated volumes and a decreased diffusion capacity.  He is referred today for pulmonary nodular disease noted on CT chest.  This was done in preparation for his aortic aneurysm procedure.  12/29/2018 and I have reviewed, shows bilateral upper lobe pulmonary nodules, 0.7 cm in the apical left upper lobe, 1.6 cm in the lateral right upper lobe.  He is on Anoro 1 inhalation once daily, has albuterol uses rarely. He is able to walk 30-45 minutes. Has trouble with carrying objects. No cough or wheeze.   ROV 07/09/19 --follow-up visit for 86 year old gentleman with COPD, CAD/CABG, diabetes, hypertension, severe obstruction by pulmonary function testing.  He has been managed on Anoro.  He also has bilateral upper lobe pulmonary nodular disease identified on CT chest 12/29/2018. We repeated a CT chest on 06/23/2019 which I have reviewed, shows advanced centrilobular and paraseptal emphysema, a possible slight increase in size in his lateral right upper lobe pulmonary nodule to 1.6 x 1.4 cm, stable left upper lobe nodule 0.6 x 0.5 cm.  He desaturate on ambulation into the room this morning, quickly recovered with rest. Still able to walk and exert at home at his own pace.  He is well, no exacerbations, uses albuterol 2-3x a week Has had his COVID vaccine  ROV 01/11/20 --follow-up visit for 86 year old gentleman with COPD, CAD/CABG, diabetes, hypertension.  He has severe obstructive lung disease managed on Anoro.  Also with bilateral upper lobe nodular disease.  We have been following with serial imaging.  Little change in size  and appearance on his most recent scan from 06/23/2019.  We decided to follow with serial imaging.  CT scan of the chest done 12/25/2019 reviewed by me, shows interval enlargement of his spiculated 2.3 cm peripheral right upper lobe pulmonary nodule consistent with a slow-growing bronchogenic carcinoma.  His 1.3 cm left upper lobe pulmonary nodule is stable in size.        Objective:   Physical Exam  Vitals:   01/11/20 1616  BP: (!) 142/70  Pulse: 69  Temp: 98 F (36.7 C)  SpO2: 93%  Weight: 166 lb 3.2 oz (75.4 kg)  Height: 5\' 11"  (1.803 m)   Gen: Pleasant, thin, in no distress,  normal affect  ENT: No lesions,  mouth clear,  oropharynx clear, no postnasal drip  Neck: No JVD, no stridor  Lungs: No use of accessory muscles, no crackles or wheezing on normal respiration, no wheeze on forced expiration  Cardiovascular: RRR, heart sounds normal, no murmur or gallops, no peripheral edema  Musculoskeletal: No deformities, no cyanosis or clubbing  Neuro: alert, awake, non focal  Skin: Warm, no lesions or rash     Assessment & Plan:  COPD (chronic obstructive pulmonary disease) ([place]) Plan to continue current Anoro.  No flares  Pulmonary nodules The right upper lobe pulmonary nodule is larger on repeat imaging.  The left upper lobe small nodule is unchanged.  I do suspect that the right upper lobe nodule represents primary malignancy.  He may be a candidate for radiation therapy without requiring bronchoscopy.  I will perform a PET scan  to better characterize the nodule, see if this may represent stage I disease.  If so then we can consider either referral to radiation oncology, bronchoscopy, serial CT scans.  Baltazar Apo, MD, PhD 01/11/2020, 4:48 PM [facility] [phone] or if no answer [phone] f

## 2020-01-11 NOTE — Assessment & Plan Note (Signed)
The right upper lobe pulmonary nodule is larger on repeat imaging.  The left upper lobe small nodule is unchanged.  I do suspect that the right upper lobe nodule represents primary malignancy.  He may be a candidate for radiation therapy without requiring bronchoscopy.  I will perform a PET scan to better characterize the nodule, see if this may represent stage I disease.  If so then we can consider either referral to radiation oncology, bronchoscopy, serial CT scans.

## 2020-01-20 ENCOUNTER — Other Ambulatory Visit: Payer: Self-pay | Admitting: Internal Medicine

## 2020-01-25 ENCOUNTER — Encounter (HOSPITAL_COMMUNITY): Payer: Medicare Other

## 2020-01-26 ENCOUNTER — Ambulatory Visit (HOSPITAL_COMMUNITY)
Admission: RE | Admit: 2020-01-26 | Discharge: 2020-01-26 | Disposition: A | Discharge status: Home / Self Care | Payer: Medicare Other | Source: Ambulatory Visit | Attending: Emergency Medicine | Admitting: Emergency Medicine

## 2020-01-26 ENCOUNTER — Other Ambulatory Visit: Payer: Self-pay

## 2020-01-26 DIAGNOSIS — R918 Other nonspecific abnormal finding of lung field: Secondary | ICD-10-CM | POA: Diagnosis not present

## 2020-01-26 DIAGNOSIS — R911 Solitary pulmonary nodule: Secondary | ICD-10-CM | POA: Diagnosis not present

## 2020-01-26 LAB — GLUCOSE, CAPILLARY: Glucose-Capillary: 101 mg/dL — ABNORMAL HIGH (ref 70–99)

## 2020-01-26 MED ORDER — FLUDEOXYGLUCOSE F - 18 (FDG) INJECTION
8.2000 | Freq: Once | INTRAVENOUS | Status: AC | PRN
  Administered 2020-01-26: 8.2 via INTRAVENOUS

## 2020-01-27 ENCOUNTER — Ambulatory Visit (INDEPENDENT_AMBULATORY_CARE_PROVIDER_SITE_OTHER): Payer: Medicare Other | Admitting: Podiatry

## 2020-01-27 ENCOUNTER — Encounter: Payer: Self-pay | Admitting: Podiatry

## 2020-01-27 DIAGNOSIS — E1159 Type 2 diabetes mellitus with other circulatory complications: Secondary | ICD-10-CM | POA: Diagnosis not present

## 2020-01-27 DIAGNOSIS — I739 Peripheral vascular disease, unspecified: Secondary | ICD-10-CM

## 2020-01-27 DIAGNOSIS — N183 Chronic kidney disease, stage 3 unspecified: Secondary | ICD-10-CM

## 2020-01-27 DIAGNOSIS — B351 Tinea unguium: Secondary | ICD-10-CM

## 2020-01-27 DIAGNOSIS — M79675 Pain in left toe(s): Secondary | ICD-10-CM

## 2020-01-27 DIAGNOSIS — M79674 Pain in right toe(s): Secondary | ICD-10-CM

## 2020-01-27 NOTE — Progress Notes (Signed)
This patient returns to my office for at risk foot care.  This patient requires this care by a professional since this patient will be at risk due to having chronic kidney disease and diabetes.  This patient is unable to cut nails himself since the patient cannot reach his nails.These nails are painful walking and wearing shoes.  This patient presents for at risk foot care today.  General Appearance  Alert, conversant and in no acute stress.  Vascular  Dorsalis pedis and posterior tibial  pulses are absent   bilaterally.  Capillary return is within normal limits  bilaterally. Temperature is within normal limits  Bilaterally. Venous stasis  Left leg/foot.  Neurologic  Senn-Weinstein monofilament wire test within normal limits  bilaterally. Muscle power within normal limits bilaterally.  Nails Thick disfigured discolored nails with subungual debris  from hallux to fifth toes bilaterally. No evidence of bacterial infection or drainage bilaterally.  Orthopedic  No limitations of motion  feet .  No crepitus or effusions noted.  No bony pathology or digital deformities noted.  HAV  B/L.  Skin  normotropic skin with no porokeratosis noted bilaterally.  No signs of infections or ulcers noted.     Onychomycosis  Pain in right toes  Pain in left toes  Consent was obtained for treatment procedures.   Mechanical debridement of nails 1-5  bilaterally performed with a nail nipper.  Filed with dremel without incident.    Return office visit  3 months                    Told patient to return for periodic foot care and evaluation due to potential at risk complications.   Gardiner Barefoot DPM

## 2020-01-28 ENCOUNTER — Telehealth: Payer: Self-pay | Admitting: Emergency Medicine

## 2020-01-28 DIAGNOSIS — R918 Other nonspecific abnormal finding of lung field: Secondary | ICD-10-CM

## 2020-01-28 NOTE — Telephone Encounter (Signed)
Call patient discussed PET scan, no answer, left message.  Will need to call back to discuss further. His enlarging right upper lobe nodule is hypermetabolic, the left upper lobe nodule that was stable in size is not hypermetabolic.  He may be a candidate for empiric radiation therapy based on the findings.  I can discuss with radiation oncology.

## 2020-02-01 NOTE — Telephone Encounter (Signed)
[phone] pt calling back  Or [phone]

## 2020-02-02 NOTE — Telephone Encounter (Signed)
I was able to speak with him, review the PET data nad the plan. I will discuss his case with RadOnc at Thoracic Conference this Thursday.

## 2020-02-04 ENCOUNTER — Other Ambulatory Visit: Payer: Self-pay | Admitting: *Deleted

## 2020-02-04 NOTE — Telephone Encounter (Signed)
Discussed his case at [employer]. Consensus was that it is reasonable to consider SBRT of his hypermetabolic RUL nodule without biopsy. I will refer him to Radiation Oncology

## 2020-02-04 NOTE — Progress Notes (Signed)
The proposed treatment discussed in cancer conference 02/04/20 is for discussion purpose only and is not a binding recommendation.  The patient was not physically examined nor present for their treatment options.  Therefore, final treatment plans cannot be decided.

## 2020-02-05 ENCOUNTER — Telehealth: Payer: Self-pay | Admitting: *Deleted

## 2020-02-05 NOTE — Telephone Encounter (Signed)
LVM for call back to schedule appointment with Dr. Sondra Come.

## 2020-02-15 ENCOUNTER — Telehealth: Payer: Self-pay | Admitting: Internal Medicine

## 2020-02-15 NOTE — Telephone Encounter (Signed)
Copied from [place] [phone]. Topic: Medicare AWV >> Feb 15, 2020 11:22 AM Cher Nakai R wrote: Reason for CRM:  Left message for patient to call back and schedule Medicare Annual Wellness Visit (AWV) in office.   If not able to come in office, please offer to do virtually.    No hx of AWV - AWV-I eligible as of 02/19/2009  Please schedule at anytime with the Nurse Health Advisor.   45 minute appointment

## 2020-02-18 ENCOUNTER — Encounter: Payer: Self-pay | Admitting: Radiation Oncology

## 2020-02-18 ENCOUNTER — Ambulatory Visit
Admission: RE | Admit: 2020-02-18 | Discharge: 2020-02-18 | Disposition: A | Discharge status: Home / Self Care | Payer: Medicare Other | Source: Ambulatory Visit | Attending: Radiation Oncology | Admitting: Radiation Oncology

## 2020-02-18 ENCOUNTER — Other Ambulatory Visit: Payer: Self-pay

## 2020-02-18 VITALS — BP 174/64 | HR 59 | Temp 98.5°F | Resp 18 | Ht 71.0 in | Wt 167.0 lb

## 2020-02-18 DIAGNOSIS — I252 Old myocardial infarction: Secondary | ICD-10-CM | POA: Insufficient documentation

## 2020-02-18 DIAGNOSIS — Z79899 Other long term (current) drug therapy: Secondary | ICD-10-CM | POA: Insufficient documentation

## 2020-02-18 DIAGNOSIS — R599 Enlarged lymph nodes, unspecified: Secondary | ICD-10-CM | POA: Insufficient documentation

## 2020-02-18 DIAGNOSIS — K449 Diaphragmatic hernia without obstruction or gangrene: Secondary | ICD-10-CM | POA: Diagnosis not present

## 2020-02-18 DIAGNOSIS — E785 Hyperlipidemia, unspecified: Secondary | ICD-10-CM | POA: Diagnosis not present

## 2020-02-18 DIAGNOSIS — G629 Polyneuropathy, unspecified: Secondary | ICD-10-CM | POA: Diagnosis not present

## 2020-02-18 DIAGNOSIS — M129 Arthropathy, unspecified: Secondary | ICD-10-CM | POA: Diagnosis not present

## 2020-02-18 DIAGNOSIS — R911 Solitary pulmonary nodule: Secondary | ICD-10-CM | POA: Diagnosis not present

## 2020-02-18 DIAGNOSIS — Z7982 Long term (current) use of aspirin: Secondary | ICD-10-CM | POA: Insufficient documentation

## 2020-02-18 DIAGNOSIS — E119 Type 2 diabetes mellitus without complications: Secondary | ICD-10-CM | POA: Diagnosis not present

## 2020-02-18 DIAGNOSIS — Z86010 Personal history of colonic polyps: Secondary | ICD-10-CM | POA: Diagnosis not present

## 2020-02-18 DIAGNOSIS — Z87891 Personal history of nicotine dependence: Secondary | ICD-10-CM | POA: Diagnosis not present

## 2020-02-18 DIAGNOSIS — I2510 Atherosclerotic heart disease of native coronary artery without angina pectoris: Secondary | ICD-10-CM | POA: Insufficient documentation

## 2020-02-18 DIAGNOSIS — R918 Other nonspecific abnormal finding of lung field: Secondary | ICD-10-CM | POA: Diagnosis not present

## 2020-02-18 DIAGNOSIS — I10 Essential (primary) hypertension: Secondary | ICD-10-CM | POA: Insufficient documentation

## 2020-02-18 DIAGNOSIS — J449 Chronic obstructive pulmonary disease, unspecified: Secondary | ICD-10-CM | POA: Insufficient documentation

## 2020-02-18 DIAGNOSIS — Z808 Family history of malignant neoplasm of other organs or systems: Secondary | ICD-10-CM | POA: Diagnosis not present

## 2020-02-18 DIAGNOSIS — I714 Abdominal aortic aneurysm, without rupture: Secondary | ICD-10-CM | POA: Insufficient documentation

## 2020-02-18 NOTE — Progress Notes (Signed)
Radiation Oncology         [phone]) [phone] ________________________________  Initial Outpatient Consultation  Name: Brian [name] MRN: [id number]  Date: 02/18/2020  DOB: 06-11-1931  BZ:JIRCV, [name], MD  Collene Gobble, MD   REFERRING PHYSICIAN: Collene Gobble, MD  DIAGNOSIS: The encounter diagnosis was Pulmonary nodules.  Pet positive Right upper lobe lung nodule, suspicious for primary bronchogenic carcinoma  HISTORY OF PRESENT ILLNESS::Brian [name] is a 86 y.o. male who is seen as a courtesy of Dr. Lamonte Sakai for an opinion concerning radiation therapy as part of management for his recently diagnosed lung cancer. Today, he is accompanied by his son. The patient underwent a CTA of chest, abdomen, and pelvis on 12/29/2018 prior to repair of abdominal aortic aneurysm. Incidentally, there were found to be bilateral upper lobe pulmonary nodules that were suspicious for primary bronchogenic carcinoma. Additionally, there was a 1.3 cm precarinal lymph nodes that was non-specific.  Given the above findings, the patient was referred to Dr. Lamonte Sakai and was seen in consultation on 04/27/2019. It was recommended that he undergo repeat imaging and then decide whether diagnostic procedures were indicated.  Super D chest CT scan on 06/23/2019 showed stable to slight increase in size of te irregular nodule within the lateral right upper lobe. Left upper lobe lung nodule was stable. Given the minimal change, if any, in the appearance and size, Dr. Lamonte Sakai recommended follow-up in six months for interval change and to determine whether further workup was necessary.  Super D chest CT scan on 12/25/2019 showed interval growth of the irregular solid spiculated 2.3 cm peripheral right upper lobe pulmonary nodule that was suspicious for primary bronchogenic carcinoma. The irregular solid 1.3 cm left upper lobe pulmonary nodule was stable. Mild right paratracheal adenopathy was also stable.  The patient was last  seen by Dr. Lamonte Sakai on 01/11/2020, who believed that the patient may be a candidate for radiation therapy without requiring bronchoscopy. He recommended PET scan to better characterize the nodule and then consider radiation therapy vs bronchoscopy vs serial CT scans.  PET scan on 01/26/2020 showed a right upper lobe nodule with hypermetabolic features that were compatible with bronchogenic neoplasm. The spiculated left upper lobe process was without hypermetabolic features; the possibility of multifocal bronchogenic neoplasm was considered but findings were stable, so continued surveillance was suggested. There was also noted to be mild asymmetry of FDG activity in the right hilum without visible lymph node. There were no definite sings of disease elsewhere in the chest, abdomen, or pelvis.   The patient's case was presented at the multidisciplinary conference.  Given the patient's age and prior medical history he did not wish to consider surgery.  Biopsy or bronchoscopy was also felt to be somewhat of risk and recommendations were to proceed with evaluation for SBRT without tissue diagnosis given the significant PET positive lesion.  PREVIOUS RADIATION THERAPY: No  PAST MEDICAL HISTORY:  Past Medical History:  Diagnosis Date  . AAA (abdominal aortic aneurysm) ([place])   . Arthritis   . Asthma 1976  . Bronchiolitis May 2016  . CAD (coronary artery disease)    NSTEMI 02/2010;  s/p CABG 1/12 (L-LAD, SOM1, S-PDA); echo in 08/2010: Mild LVH, EF 89%, grade 2 diastolic dysfunction, mild MR, mild LAE, mildly reduced RV function, mild RAE, PASP 42  . Carpal tunnel syndrome of left wrist   . Colon polyps   . COPD (chronic obstructive pulmonary disease) ([place])   . Diabetes mellitus 2000  Type II, controlls with diet  . Dyspnea   . ED (erectile dysfunction)   . Gout   . HTN (hypertension)   . Hyperlipidemia   . Inguinal hernia    Right  . Macular degeneration   . Myocardial infarction ([place]) 1 -13-2012   . Neuropathy     PAST SURGICAL HISTORY: Past Surgical History:  Procedure Laterality Date  . 3 vessel CABG  03/03/2010  . ABDOMINAL AORTIC ENDOVASCULAR STENT GRAFT N/A 01/21/2019   Procedure: ABDOMINAL AORTIC ENDOVASCULAR STENT GRAFT;  Surgeon: Waynetta Sandy, MD;  Location: [facility];  Service: Vascular;  Laterality: N/A;  . cataract left eye    . COLONOSCOPY     neg 2009  . COLONOSCOPY  2014   5 mm sessile polyp; AVM  . CORONARY ARTERY BYPASS GRAFT  2012   X3  . ENDOVASCULAR REPAIR/STENT GRAFT  01/21/2019   ABDOMINAL AORTIC ENDOVASCULAR STENT GRAFT (N/A )  . EYE SURGERY     Left cataract  . HERNIA REPAIR    . INCISION AND DRAINAGE PERIRECTAL ABSCESS    . INGUINAL HERNIA REPAIR      FAMILY HISTORY:  Family History  Problem Relation Age of Onset  . Heart failure Mother   . Heart disease Mother        CHF  . Heart attack Mother   . Hypertension Mother   . Heart attack Father   . Heart disease Father 9       MI  . Hypertension Father   . Heart attack Sister 65  . Heart disease Sister 5       MI  . Diabetes Sister   . Hyperlipidemia Sister   . Hypertension Sister   . Varicose Veins Sister   . Heart attack Brother 65  . Diabetes Brother   . Heart disease Brother 79       MI  . Cancer Brother   . Hypertension Brother   . Deep vein thrombosis Daughter   . Leukemia Daughter   . Other Son        varicose veins  . Arthritis/Rheumatoid Son   . Lupus Daughter   . Colon cancer Neg Hx     SOCIAL HISTORY:  Social History   Tobacco Use  . Smoking status: Former Smoker    Packs/day: 1.00    Years: 40.00    Pack years: 40.00    Types: Cigarettes    Quit date: 02/20/2003    Years since quitting: 17.0  . Smokeless tobacco: Never Used  Vaping Use  . Vaping Use: Never used  Substance Use Topics  . Alcohol use: Not Currently    Alcohol/week: 6.0 standard drinks    Types: 3 Glasses of wine, 3 Shots of liquor per week    Comment:  1-3 drinks a week  .  Drug use: No    ALLERGIES:  Allergies  Allergen Reactions  . Meperidine Hcl     Tongue swelling Because of a history of documented adverse serious drug reaction;Medi Alert bracelet  is recommended  . Gabapentin     confusion  . Zolpidem Tartrate Other (See Comments)    Nightmares    MEDICATIONS:  Current Outpatient Medications  Medication Sig Dispense Refill  . ACCU-CHEK COMPACT STRIPS test strip TEST BLOOD SUGAR ONCE DAILY 100 each 1  . Aspirin (ADULT ASPIRIN LOW STRENGTH) 81 MG EC tablet Take 81 mg by mouth daily.      . cholecalciferol (VITAMIN D) 1000 UNITS tablet  Take 3,000 Units by mouth daily.     . enalapril (VASOTEC) 10 MG tablet TAKE 1 TABLET TWICE A DAY 180 tablet 2  . Magnesium Oxide 420 MG TABS Take 420 mg by mouth daily.     . metoprolol succinate (TOPROL-XL) 25 MG 24 hr tablet TAKE ONE-HALF (1/2) TABLET DAILY 45 tablet 3  . mirtazapine (REMERON) 15 MG tablet Take 1 tablet (15 mg total) by mouth at bedtime. 30 tablet 5  . Multiple Vitamins-Minerals (MULTIVITAL PO) Take 1 tablet by mouth daily.     Vladimir Faster Glycol-Propyl Glycol (SYSTANE) 0.4-0.3 % SOLN Place 1 drop into both eyes daily.    . simvastatin (ZOCOR) 40 MG tablet TAKE 1 TABLET DAILY 90 tablet 3  . temazepam (RESTORIL) 7.5 MG capsule Take 1 capsule (7.5 mg total) by mouth at bedtime as needed. 90 capsule 1  . triamcinolone cream (KENALOG) 0.1 % Apply 1 application topically daily as needed (Rash).    Marland Kitchen umeclidinium-vilanterol (ANORO ELLIPTA) 62.5-25 MCG/INH AEPB USE 1 INHALATION DAILY 120 each 2   No current facility-administered medications for this encounter.    REVIEW OF SYSTEMS:  A 10+ POINT REVIEW OF SYSTEMS WAS OBTAINED including neurology, dermatology, psychiatry, cardiac, respiratory, lymph, extremities, GI, GU, musculoskeletal, constitutional, reproductive, HEENT.  He denies any pain within the chest area significant cough or hemoptysis.  Denies any new headaches or bony pain.   PHYSICAL EXAM:   height is 5\' 11"  (1.803 m) and weight is 167 lb (75.8 kg). His oral temperature is 98.5 F (36.9 C). His blood pressure is 174/64 (abnormal) and his pulse is 59 (abnormal). His respiration is 18 and oxygen saturation is 98%.   General: Alert and oriented, in no acute distress HEENT: Head is normocephalic. Extraocular movements are intact.  Neck: Neck is supple, no palpable cervical or supraclavicular lymphadenopathy. Heart: Regular in rate and rhythm with no murmurs, rubs, or gallops. Chest: Clear to auscultation bilaterally, with no rhonchi, wheezes, or rales. Abdomen: Soft, nontender, nondistended, with no rigidity or guarding. Extremities: No cyanosis or edema. Lymphatics: see Neck Exam Skin: No concerning lesions. Musculoskeletal: symmetric strength and muscle tone throughout. Neurologic: Cranial nerves II through XII are grossly intact. No obvious focalities. Speech is fluent. Coordination is intact. Psychiatric: Judgment and insight are intact. Affect is appropriate.   ECOG = 1  0 - Asymptomatic (Fully active, able to carry on all predisease activities without restriction)  1 - Symptomatic but completely ambulatory (Restricted in physically strenuous activity but ambulatory and able to carry out work of a light or sedentary nature. For example, light housework, office work)  2 - Symptomatic, <50% in bed during the day (Ambulatory and capable of all self care but unable to carry out any work activities. Up and about more than 50% of waking hours)  3 - Symptomatic, >50% in bed, but not bedbound (Capable of only limited self-care, confined to bed or chair 50% or more of waking hours)  4 - Bedbound (Completely disabled. Cannot carry on any self-care. Totally confined to bed or chair)  5 - Death   Eustace Pen MM, Creech RH, Tormey DC, et al. [phone]). "Toxicity and response criteria of the [facility]". [place] Oncol. 5 (6): 649-55  LABORATORY DATA:  Lab Results   Component Value Date   WBC 5.0 10/15/2019   HGB 13.5 10/15/2019   HCT 40.9 10/15/2019   MCV 94.0 10/15/2019   PLT 185 10/15/2019   NEUTROABS 2,755 10/15/2019   Lab  Results  Component Value Date   NA 138 10/15/2019   K 4.9 10/15/2019   CL 104 10/15/2019   CO2 26 10/15/2019   GLUCOSE 103 (H) 10/15/2019   CREATININE 1.58 (H) 10/15/2019   CALCIUM 9.5 10/15/2019     ggggggggggggggggggggggggggggggggggggggggggggggggggggggggggggggggggggggggggggggggggggggggggggggggggggggggggggggggggggggggggggggggggggggggggggggggggggggggggggggggggggggggggggggggggggggggggggggggggggggggggggggggggggggggggggggggggggggggggggggggggggggggggggggggggggggggggggggggggggggggggggggggggggggggggggggggggggggggggggggggggggggggggggggggggggggggggggggggggggggggggggggggggggggggggggggggggggggggggggggggggggggggggggggggggggggggggggggggggggggggggggggggggggggggg RADIOGRAPHY: NM PET Image Initial (PI) Skull Base To Thigh  Result Date: 01/27/2020 CLINICAL DATA:  Initial treatment strategy for RIGHT upper lobe pulmonary nodule. EXAM: NUCLEAR MEDICINE PET SKULL BASE TO THIGH TECHNIQUE: 8.2 mCi F-18 FDG was injected intravenously. Full-ring PET imaging was performed from the skull base to thigh after the radiotracer. CT data was obtained and used for attenuation correction and anatomic localization. Fasting blood glucose: 101 mg/dl COMPARISON:  Multiple prior studies most recent December 25, 2019 FINDINGS: Mediastinal blood pool activity: SUV max 2.5 Liver activity: SUV max NA NECK: No hypermetabolic lymph nodes in the neck. Incidental CT findings: none CHEST: Pulmonary nodule with spiculated features in the RIGHT upper lobe, better measured on previous exam approximately 2.3 cm greatest axial dimension with a maximum SUV of 8.6 (image 68, series 4) spiculations extend to the visceral pleura. Spiculated nodule in the LEFT upper lobe without significant FDG uptake stable compared to the recent exam approximately 12 mm greatest dimension no effusion.  No consolidation. Mild asymmetric uptake about the RIGHT hilum without visible lymph node maximum SUV of approximately 3.3 in this location (image 75, series 4). Incidental CT findings: Calcified atheromatous plaque in the thoracic aorta. No aneurysmal dilation. Dilation of main pulmonary artery to 3.7 cm. Three-vessel coronary artery calcification. No pericardial effusion. ABDOMEN/PELVIS: No abnormal hypermetabolic activity within the liver, pancreas, adrenal glands, or spleen. No hypermetabolic lymph nodes in the abdomen or pelvis. Incidental CT findings: Liver, gallbladder, spleen, pancreas, adrenal glands and kidneys without acute process. Urinary bladder grossly normal. No acute gastrointestinal process. Colonic diverticulosis. Normal appendix. Signs of aorto bi-iliac endo grafting. Decreased maximal caliber of the endo sac compared to the prior study at approximately 4.2 cm greatest dimension as compared to 4.9 cm on January of 2021. Calcifications of the native vasculature. SKELETON: No focal hypermetabolic activity to suggest skeletal metastasis. Incidental CT findings: Spinal degenerative changes. No acute or destructive bone process. IMPRESSION: 1. RIGHT upper lobe nodule with hypermetabolic features compatible with bronchogenic neoplasm. 2. Spiculated LEFT upper lobe process without hypermetabolic features, the possibility of multifocal bronchogenic neoplasm is considered but findings are stable, continued surveillance is suggested. 3. Mild asymmetry of FDG activity in the RIGHT hilum without visible lymph node. No definitive signs of disease elsewhere in the chest, abdomen or pelvis. 4. Pulmonary emphysema and aortic atherosclerosis along with coronary artery disease. 5. Signs of endovascular repair of abdominal aortic aneurysm with decreased size of endo sac compared to imaging from January of 2021. Vascular not well assessed noncontrast CT images. 6. Dilation of main pulmonary artery may be seen in the  setting of pulmonary arterial hypertension. Aortic Atherosclerosis (ICD10-I70.0) and Emphysema (ICD10-J43.9). Electronically Signed   By: Zetta Bills M.D.   On: 01/27/2020 11:10      IMPRESSION:Pet positive Right upper lobe lung nodule, suspicious for primary bronchogenic carcinoma  As above the patient does not wish to consider surgical intervention given his significant medical history and age.  In addition biopsy would be somewhat risky in this situation.  Patient will be a candidate for definitive treatment without tissue diagnosis given the solitary PET positive lesion.  He would be an excellent candidate for SBRT given the location of the lesion.  Today, I talked to the patient and son about the findings and work-up thus far.  We discussed the natural history of lung cancer and general treatment, highlighting the role of radiotherapy in the management.  We discussed the available radiation techniques, and focused on the details of logistics and delivery.  We reviewed the anticipated acute and late sequelae associated with radiation in this setting.  The patient was encouraged to ask questions that I answered to the best of my ability.  A patient consent form was discussed and signed.  We retained a copy for our records.  The patient would like to proceed with radiation and will be scheduled for CT simulation.  PLAN: The patient will be scheduled for SBRT simulation in the near future.  Anticipate 3 SBRT treatments directed at the PET positive solitary nodule in the right upper lobe.  Total time spent in this encounter was 60 minutes which included reviewing the patient's most recent consultations, follow-ups, chest CT scans, PET scan, physical examination, and documentation.   ------------------------------------------------  Blair Promise, PhD, MD  This document serves as a record of services personally performed by Gery Pray, MD. It was created on his behalf by Clerance Lav, a  trained medical scribe. The creation of this record is based on the scribe's personal observations and the provider's statements to them. This document has been checked and approved by the attending provider.

## 2020-02-18 NOTE — Progress Notes (Signed)
Patient here for a consult with Dr. Sondra Come.  Thoracic Location of Tumor / Histology:  Nodule found when treated for aneurysm repair 12/20   Tobacco/Marijuana/Snuff/ETOH use: past hs, quit 2005  Past/Anticipated interventions by cardiothoracic surgery, if any: no  Past/Anticipated interventions by medical oncology, if any: no  Signs/Symptoms Weight changes, if any: no Respiratory complaints, if any: shortness of breath with exertion Hemoptysis, if any: no Pain issues, if any: no  SAFETY ISSUES: Prior radiation? no Pacemaker/ICD? no Possible current pregnancy? N/a Is the patient on methotrexate? no  Current Complaints / other details:  Patient's son is here with him.  BP (!) 174/64 (BP Location: Right Arm, Patient Position: Sitting, Cuff Size: Normal)   Pulse (!) 59   Temp 98.5 F (36.9 C) (Oral)   Resp 18   Ht 5\' 11"  (1.803 m)   Wt 167 lb (75.8 kg)   SpO2 98%   BMI 23.29 kg/m    Wt Readings from Last 3 Encounters:  02/18/20 167 lb (75.8 kg)  01/11/20 166 lb 3.2 oz (75.4 kg)  11/26/19 162 lb 3.2 oz (73.6 kg)

## 2020-02-22 ENCOUNTER — Encounter: Payer: Self-pay | Admitting: Licensed Clinical Social Worker

## 2020-02-22 NOTE — Progress Notes (Signed)
Received return call from patient. States he is very nervous about treatment because of his claustrophobia and he can't have his arm above his head for an hour. He has already discussed with medical team.  Patient's oldest daughter died in 2022-10-21 prior to his diagnosis.  CSW normalized being nervous with new diagnosis and treatment he has never received before. Offered support through [facility] or spiritual care. Currently, patient talks with his girlfriend (who is also a Education officer, museum) and has his son as support. Son will also drive him to appts.   CSW will attempt to see pt during a treatment appt if available. Otherwise, patient may call as needed.   Christeen Douglas, LCSW

## 2020-02-22 NOTE — Progress Notes (Signed)
[place] Psychosocial Distress Screening Clinical Social Work  Clinical Social Work was referred by distress screening protocol.  The patient scored a 7 on the Psychosocial Distress Thermometer which indicates moderate distress.   Clinical Social Worker attempted to contact patient by phone to assess for distress and other psychosocial needs.  No answer. Left VM introducing support services and gave direct contact information.  ONCBCN DISTRESS SCREENING 02/18/2020  Screening Type Initial Screening  Distress experienced in past week (1-10) 7  Referral to clinical social work Yes      Genevia Bouldin Florene Glen, LCSW

## 2020-02-23 ENCOUNTER — Other Ambulatory Visit: Payer: Self-pay | Admitting: Radiation Oncology

## 2020-02-23 MED ORDER — LORAZEPAM 0.5 MG PO TABS: 0.5000 mg | ORAL_TABLET | Freq: Three times a day (TID) | ORAL | 0 refills | Status: DC

## 2020-02-25 ENCOUNTER — Telehealth: Payer: Self-pay

## 2020-02-25 NOTE — Telephone Encounter (Signed)
Patient's son [name] called and states he tested positive for Covid and his father has been exposed. He was advised that if his father's test is negative on 02/26/2020 that the patient can keep his appt. on 02/29/20. If the patient tests positive then the family needs to call and r/s his appointment. The patient will have someone bring him that has not been exposed.

## 2020-02-25 NOTE — Telephone Encounter (Signed)
Patient called and states he already has his Ativan to take on Monday 02/29/20. Patient advised to come to his treatment at 1245 and her reports his son will bring him and take him home. Patient verbalized instructions of what he needs to do.

## 2020-02-29 ENCOUNTER — Ambulatory Visit
Admission: RE | Admit: 2020-02-29 | Discharge: 2020-02-29 | Disposition: A | Discharge status: Home / Self Care | Payer: Medicare Other | Source: Ambulatory Visit | Attending: Radiation Oncology | Admitting: Radiation Oncology

## 2020-02-29 ENCOUNTER — Other Ambulatory Visit: Payer: Self-pay

## 2020-02-29 DIAGNOSIS — Z510 Encounter for antineoplastic radiation therapy: Secondary | ICD-10-CM | POA: Diagnosis not present

## 2020-02-29 DIAGNOSIS — R918 Other nonspecific abnormal finding of lung field: Secondary | ICD-10-CM | POA: Diagnosis not present

## 2020-02-29 DIAGNOSIS — R911 Solitary pulmonary nodule: Secondary | ICD-10-CM | POA: Insufficient documentation

## 2020-02-29 DIAGNOSIS — Z87891 Personal history of nicotine dependence: Secondary | ICD-10-CM | POA: Diagnosis not present

## 2020-02-29 DIAGNOSIS — Z808 Family history of malignant neoplasm of other organs or systems: Secondary | ICD-10-CM | POA: Diagnosis not present

## 2020-03-03 DIAGNOSIS — Z87891 Personal history of nicotine dependence: Secondary | ICD-10-CM | POA: Diagnosis not present

## 2020-03-03 DIAGNOSIS — Z510 Encounter for antineoplastic radiation therapy: Secondary | ICD-10-CM | POA: Diagnosis not present

## 2020-03-03 DIAGNOSIS — Z808 Family history of malignant neoplasm of other organs or systems: Secondary | ICD-10-CM | POA: Diagnosis not present

## 2020-03-03 DIAGNOSIS — R918 Other nonspecific abnormal finding of lung field: Secondary | ICD-10-CM | POA: Diagnosis not present

## 2020-03-03 DIAGNOSIS — R911 Solitary pulmonary nodule: Secondary | ICD-10-CM | POA: Diagnosis not present

## 2020-03-07 ENCOUNTER — Other Ambulatory Visit: Payer: Self-pay

## 2020-03-07 ENCOUNTER — Ambulatory Visit: Admission: RE | Admit: 2020-03-07 | Payer: Medicare Other | Source: Ambulatory Visit | Admitting: Radiation Oncology

## 2020-03-09 ENCOUNTER — Ambulatory Visit
Admission: RE | Admit: 2020-03-09 | Discharge: 2020-03-09 | Disposition: A | Discharge status: Home / Self Care | Payer: Medicare Other | Source: Ambulatory Visit | Attending: Radiation Oncology | Admitting: Radiation Oncology

## 2020-03-09 ENCOUNTER — Other Ambulatory Visit: Payer: Self-pay

## 2020-03-09 DIAGNOSIS — R911 Solitary pulmonary nodule: Secondary | ICD-10-CM | POA: Diagnosis not present

## 2020-03-09 DIAGNOSIS — Z510 Encounter for antineoplastic radiation therapy: Secondary | ICD-10-CM | POA: Diagnosis not present

## 2020-03-09 DIAGNOSIS — R918 Other nonspecific abnormal finding of lung field: Secondary | ICD-10-CM

## 2020-03-10 ENCOUNTER — Ambulatory Visit (HOSPITAL_COMMUNITY)
Admission: RE | Admit: 2020-03-10 | Discharge: 2020-03-10 | Disposition: A | Discharge status: Home / Self Care | Payer: Medicare Other | Source: Ambulatory Visit | Attending: Internal Medicine | Admitting: Internal Medicine

## 2020-03-10 ENCOUNTER — Ambulatory Visit (INDEPENDENT_AMBULATORY_CARE_PROVIDER_SITE_OTHER): Payer: Medicare Other | Admitting: Physician Assistant

## 2020-03-10 VITALS — BP 167/71 | HR 60 | Temp 98.3°F | Resp 20 | Ht 71.0 in | Wt 159.6 lb

## 2020-03-10 DIAGNOSIS — I714 Abdominal aortic aneurysm, without rupture, unspecified: Secondary | ICD-10-CM

## 2020-03-10 NOTE — Progress Notes (Signed)
Established EVAR   History of Present Illness   Brian [name] is a 87 y.o. (10-14-1931) male who presents for routine follow up s/p EVAR by Dr. Donzetta Matters (Date: 01/21/19) for a 5.5cm AAA.  Patient denies any new or changing abdominal or back pain.  He also denies any claudication, rest pain, or nonhealing wounds of bilateral lower extremities.  He is taking an aspirin and a statin daily.  He is a former smoker.  He was recently diagnosed with lung cancer and received his first radiation treatment yesterday.  The patient's PMH, PSH, SH, and FamHx were reviewed and are unchanged from prior visit.  Current Outpatient Medications  Medication Sig Dispense Refill  . ACCU-CHEK COMPACT STRIPS test strip TEST BLOOD SUGAR ONCE DAILY 100 each 1  . Aspirin 81 MG EC tablet Take 81 mg by mouth daily.    . cholecalciferol (VITAMIN D) 1000 UNITS tablet Take 3,000 Units by mouth daily.     . enalapril (VASOTEC) 10 MG tablet TAKE 1 TABLET TWICE A DAY 180 tablet 2  . LORazepam (ATIVAN) 0.5 MG tablet Take 1 tablet (0.5 mg total) by mouth every 8 (eight) hours. Take 1/2 hour prior to CT scan or radiation treatment for anxiety 10 tablet 0  . Magnesium Oxide 420 MG TABS Take 420 mg by mouth daily.     . metoprolol succinate (TOPROL-XL) 25 MG 24 hr tablet TAKE ONE-HALF (1/2) TABLET DAILY 45 tablet 3  . mirtazapine (REMERON) 15 MG tablet Take 1 tablet (15 mg total) by mouth at bedtime. 30 tablet 5  . Multiple Vitamins-Minerals (MULTIVITAL PO) Take 1 tablet by mouth daily.     Brian [name] Glycol-Propyl Glycol (SYSTANE) 0.4-0.3 % SOLN Place 1 drop into both eyes daily.    . simvastatin (ZOCOR) 40 MG tablet TAKE 1 TABLET DAILY 90 tablet 3  . temazepam (RESTORIL) 7.5 MG capsule Take 1 capsule (7.5 mg total) by mouth at bedtime as needed. 90 capsule 1  . triamcinolone cream (KENALOG) 0.1 % Apply 1 application topically daily as needed (Rash).    Marland Kitchen umeclidinium-vilanterol (ANORO ELLIPTA) 62.5-25 MCG/INH AEPB USE 1  INHALATION DAILY 120 each 2   No current facility-administered medications for this visit.    REVIEW OF SYSTEMS (negative unless checked):   Cardiac:  []  Chest pain or chest pressure? []  Shortness of breath upon activity? []  Shortness of breath when lying flat? []  Irregular heart rhythm?  Vascular:  []  Pain in calf, thigh, or hip brought on by walking? []  Pain in feet at night that wakes you up from your sleep? []  Blood clot in your veins? []  Leg swelling?  Pulmonary:  []  Oxygen at home? []  Productive cough? []  Wheezing?  Neurologic:  []  Sudden weakness in arms or legs? []  Sudden numbness in arms or legs? []  Sudden onset of difficult speaking or slurred speech? []  Temporary loss of vision in one eye? []  Problems with dizziness?  Gastrointestinal:  []  Blood in stool? []  Vomited blood?  Genitourinary:  []  Burning when urinating? []  Blood in urine?  Psychiatric:  []  Major depression  Hematologic:  []  Bleeding problems? []  Problems with blood clotting?  Dermatologic:  []  Rashes or ulcers?  Constitutional:  []  Fever or chills?  Ear/Nose/Throat:  []  Change in hearing? []  Nose bleeds? []  Sore throat?  Musculoskeletal:  []  Back pain? []  Joint pain? []  Muscle pain?   Physical Examination   Vitals:   03/10/20 0924  BP: (!) 167/71  Pulse: 60  Resp: 20  Temp: 98.3 F (36.8 C)  TempSrc: Temporal  SpO2: 95%  Weight: 159 lb 9.6 oz (72.4 kg)  Height: 5\' 11"  (1.803 m)   Body mass index is 22.26 kg/m.  General:  WDWN in NAD; vital signs documented above Gait: Not observed HENT: WNL, normocephalic Pulmonary: normal non-labored breathing Cardiac: regular HR Abdomen: soft, NT, no masses Skin: without rashes Vascular Exam/Pulses:  Right Left  Radial 2+ (normal) 2+ (normal)  DP 2+ (normal) 2+ (normal)   Extremities: without ischemic changes, without Gangrene , without cellulitis; without open wounds;  Musculoskeletal: no muscle wasting or  atrophy  Neurologic: A&O X 3;  No focal weakness or paresthesias are detected Psychiatric:  The pt has Normal affect.  Non-Invasive Vascular Imaging   EVAR Duplex   AAA sac size: 3.8 cm  no endoleak detected   Medical Decision Making   ABDUR [name] is a 87 y.o. male who presents for surveillance of EVAR   EVAR duplex shows a decreasing sac size since surgery without endoleaks  Continue aspirin and statin daily  Recheck EVAR duplex in 1 year per protocol   Brian Ligas PA-C [facility] Office: [phone]  Clinic MD: Oneida Alar

## 2020-03-14 ENCOUNTER — Ambulatory Visit
Admission: RE | Admit: 2020-03-14 | Discharge: 2020-03-14 | Disposition: A | Discharge status: Home / Self Care | Payer: Medicare Other | Source: Ambulatory Visit | Attending: Radiation Oncology | Admitting: Radiation Oncology

## 2020-03-14 ENCOUNTER — Other Ambulatory Visit: Payer: Self-pay

## 2020-03-14 DIAGNOSIS — Z510 Encounter for antineoplastic radiation therapy: Secondary | ICD-10-CM | POA: Diagnosis not present

## 2020-03-14 DIAGNOSIS — R918 Other nonspecific abnormal finding of lung field: Secondary | ICD-10-CM

## 2020-03-14 DIAGNOSIS — R911 Solitary pulmonary nodule: Secondary | ICD-10-CM | POA: Diagnosis not present

## 2020-03-16 ENCOUNTER — Encounter: Payer: Self-pay | Admitting: Radiation Oncology

## 2020-03-16 ENCOUNTER — Other Ambulatory Visit: Payer: Self-pay

## 2020-03-16 ENCOUNTER — Ambulatory Visit
Admission: RE | Admit: 2020-03-16 | Discharge: 2020-03-16 | Disposition: A | Discharge status: Home / Self Care | Payer: Medicare Other | Source: Ambulatory Visit | Attending: Radiation Oncology | Admitting: Radiation Oncology

## 2020-03-16 DIAGNOSIS — Z808 Family history of malignant neoplasm of other organs or systems: Secondary | ICD-10-CM | POA: Diagnosis not present

## 2020-03-16 DIAGNOSIS — R918 Other nonspecific abnormal finding of lung field: Secondary | ICD-10-CM

## 2020-03-16 DIAGNOSIS — Z87891 Personal history of nicotine dependence: Secondary | ICD-10-CM | POA: Diagnosis not present

## 2020-03-16 DIAGNOSIS — Z510 Encounter for antineoplastic radiation therapy: Secondary | ICD-10-CM | POA: Diagnosis not present

## 2020-03-16 DIAGNOSIS — R911 Solitary pulmonary nodule: Secondary | ICD-10-CM | POA: Diagnosis not present

## 2020-04-18 ENCOUNTER — Encounter: Payer: Self-pay | Admitting: *Deleted

## 2020-04-18 ENCOUNTER — Ambulatory Visit: Payer: Medicare Other | Admitting: Internal Medicine

## 2020-04-19 NOTE — Patient Instructions (Signed)
    Blood work was ordered.     Medications changes include :  none   Your prescription(s) have been submitted to your pharmacy. Please take as directed and contact our office if you believe you are having problem(s) with the medication(s).   Please followup in 6 months  

## 2020-04-19 NOTE — Progress Notes (Signed)
Subjective:    Patient ID: Brian [name], male    DOB: 1932/01/23, 87 y.o.   MRN: [id number]  HPI The patient is here for follow up of their chronic medical problems, including hypertension, diabetes, hyperlipidemia, chronic kidney disease, anxiety, sleep difficulties  Since he was here last he was diagnosed with lung cancer and has completed radiation treatment.  He is more SOB since then.  He is taking his Anoro inhaler daily.  This may help some.  Last year his daughter died from leukemia.  He was having panic attacks after that.  We started Remeron 15 mg at bedtime  He is taking his medication as prescribed.  Blood pressure at home prior to coming here - 133/65  Medications and allergies reviewed with patient and updated if appropriate.  Patient Active Problem List   Diagnosis Date Noted  . Aortic atherosclerosis ([place]) 04/20/2020  . Trigger finger of right thumb 10/15/2019  . Anxiety 10/15/2019  . Pain due to onychomycosis of toenails of both feet 07/17/2019  . Type 2 diabetes mellitus with vascular disease ([place]) 07/17/2019  . Left upper arm pain 05/11/2019  . Pulmonary nodules 04/27/2019  . AAA (abdominal aortic aneurysm) ([place]) 01/21/2019  . Nerve pain 10/15/2018  . Low vitamin B12 level 09/30/2018  . LUQ pain 09/30/2018  . Arthritis of carpometacarpal (CMC) joint of thumb 09/30/2018  . Chest pain 01/29/2018  . Decreased hearing of right ear 11/23/2017  . Vertigo 07/17/2016  . Nocturnal leg cramps 09/20/2015  . COPD (chronic obstructive pulmonary disease) ([place]) 05/31/2015  . CKD (chronic kidney disease) stage 3, GFR 30-59 ml/min (H[facility]) 05/31/2015  . Diabetes ([place]) 04/26/2015  . AAA (abdominal aortic aneurysm) without rupture ([place]) 01/18/2015  . PAD (peripheral artery disease) ([place]) 01/18/2015  . Hereditary and idiopathic peripheral neuropathy 10/29/2012  . Hypertension 05/10/2011  . CAD (coronary artery disease) 09/05/2010  . TINNITUS 04/04/2010  . CORONARY  ARTERY BYPASS GRAFT, HX OF 03/31/2010  . Disturbance in sleep behavior 12/28/2008  . Cervicalgia 07/26/2008  . POLYP, COLON 07/16/2007  . Hyperlipidemia 09/24/2006  . History of gout 09/23/2006    Current Outpatient Medications on File Prior to Visit  Medication Sig Dispense Refill  . ACCU-CHEK COMPACT STRIPS test strip TEST BLOOD SUGAR ONCE DAILY 100 each 1  . Aspirin 81 MG EC tablet Take 81 mg by mouth daily.    . cholecalciferol (VITAMIN D) 1000 UNITS tablet Take 3,000 Units by mouth daily.     . enalapril (VASOTEC) 10 MG tablet TAKE 1 TABLET TWICE A DAY 180 tablet 2  . LORazepam (ATIVAN) 0.5 MG tablet Take 1 tablet (0.5 mg total) by mouth every 8 (eight) hours. Take 1/2 hour prior to CT scan or radiation treatment for anxiety 10 tablet 0  . Magnesium Oxide 420 MG TABS Take 420 mg by mouth daily.     . metoprolol succinate (TOPROL-XL) 25 MG 24 hr tablet TAKE ONE-HALF (1/2) TABLET DAILY 45 tablet 3  . mirtazapine (REMERON) 15 MG tablet Take 1 tablet (15 mg total) by mouth at bedtime. 30 tablet 5  . Multiple Vitamins-Minerals (MULTIVITAL PO) Take 1 tablet by mouth daily.     Vladimir Faster Glycol-Propyl Glycol (SYSTANE) 0.4-0.3 % SOLN Place 1 drop into both eyes daily.    . simvastatin (ZOCOR) 40 MG tablet TAKE 1 TABLET DAILY 90 tablet 3  . temazepam (RESTORIL) 7.5 MG capsule Take 1 capsule (7.5 mg total) by mouth at bedtime as needed. 90 capsule 1  .  triamcinolone cream (KENALOG) 0.1 % Apply 1 application topically daily as needed (Rash).    Marland Kitchen umeclidinium-vilanterol (ANORO ELLIPTA) 62.5-25 MCG/INH AEPB USE 1 INHALATION DAILY 120 each 2   No current facility-administered medications on file prior to visit.    Past Medical History:  Diagnosis Date  . AAA (abdominal aortic aneurysm) ([place])   . Arthritis   . Asthma 1976  . Bronchiolitis May 2016  . CAD (coronary artery disease)    NSTEMI 02/2010;  s/p CABG 1/12 (L-LAD, SOM1, S-PDA); echo in 08/2010: Mild LVH, EF 00%, grade 2 diastolic  dysfunction, mild MR, mild LAE, mildly reduced RV function, mild RAE, PASP 42  . Carpal tunnel syndrome of left wrist   . Colon polyps   . COPD (chronic obstructive pulmonary disease) ([place])   . Diabetes mellitus 2000   Type II, controlls with diet  . Dyspnea   . ED (erectile dysfunction)   . Gout   . History of radiation therapy 02/29/2020-03/16/2020   SBRT pulmonary nodules: Dr. Gery Pray  . HTN (hypertension)   . Hyperlipidemia   . Inguinal hernia    Right  . Macular degeneration   . Myocardial infarction ([place]) 1 -13-2012  . Neuropathy     Past Surgical History:  Procedure Laterality Date  . 3 vessel CABG  03/03/2010  . ABDOMINAL AORTIC ENDOVASCULAR STENT GRAFT N/A 01/21/2019   Procedure: ABDOMINAL AORTIC ENDOVASCULAR STENT GRAFT;  Surgeon: Waynetta Sandy, MD;  Location: [facility];  Service: Vascular;  Laterality: N/A;  . cataract left eye    . COLONOSCOPY     neg 2009  . COLONOSCOPY  2014   5 mm sessile polyp; AVM  . CORONARY ARTERY BYPASS GRAFT  2012   X3  . ENDOVASCULAR REPAIR/STENT GRAFT  01/21/2019   ABDOMINAL AORTIC ENDOVASCULAR STENT GRAFT (N/A )  . EYE SURGERY     Left cataract  . HERNIA REPAIR    . INCISION AND DRAINAGE PERIRECTAL ABSCESS    . INGUINAL HERNIA REPAIR      Social History   Socioeconomic History  . Marital status: Widowed    Spouse name: Not on file  . Number of children: 3  . Years of education: 79  . Highest education level: Not on file  Occupational History  . Occupation: RETIRED    Employer: RETIRED  Tobacco Use  . Smoking status: Former Smoker    Packs/day: 1.00    Years: 40.00    Pack years: 40.00    Types: Cigarettes    Quit date: 02/20/2003    Years since quitting: 17.1  . Smokeless tobacco: Never Used  Vaping Use  . Vaping Use: Never used  Substance and Sexual Activity  . Alcohol use: Not Currently    Alcohol/week: 6.0 standard drinks    Types: 3 Glasses of wine, 3 Shots of liquor per week    Comment:  1-3  drinks a week  . Drug use: No  . Sexual activity: Not on file  Other Topics Concern  . 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    One story home   Right handed   3 children   Social Determinants of Health   Financial Resource Strain: Not on file  Food Insecurity: Not on file  Transportation Needs: Not on file  Physical Activity: Not on file  Stress: Not on file  Social Connections: Not on file    Family History  Problem Relation Age of Onset  . Heart failure Mother   . Heart disease Mother        CHF  . Heart attack Mother   . Hypertension Mother   . Heart attack Father   . Heart disease Father 77       MI  . Hypertension Father   . Heart attack Sister 37  . Heart disease Sister 90       MI  . Diabetes Sister   . Hyperlipidemia Sister   . Hypertension Sister   . Varicose Veins Sister   . Heart attack Brother 61  . Diabetes Brother   . Heart disease Brother 31       MI  . Cancer Brother   . Hypertension Brother   . Deep vein thrombosis Daughter   . Leukemia Daughter   . Other Son        varicose veins  . Arthritis/Rheumatoid Son   . Lupus Daughter   . Colon cancer Neg Hx     Review of Systems  Constitutional: Positive for appetite change. Negative for fatigue and fever.  Respiratory: Positive for shortness of breath (w/ exertion). Negative for cough and wheezing.   Cardiovascular: Negative for chest pain, palpitations and leg swelling.  Gastrointestinal: Positive for constipation (occ depends - related to anoro). Negative for abdominal pain, blood in stool, diarrhea and nausea.  Neurological: Negative for light-headedness and headaches.  Psychiatric/Behavioral: Positive for sleep disturbance (well controlled). Negative for dysphoric mood. The patient is nervous/anxious (occ panic attack).        Objective:   Vitals:   04/20/20 0826  BP: (!) 160/80  Pulse: 60  Temp: 98.2 F (36.8 C)   SpO2: 95%   BP Readings from Last 3 Encounters:  04/20/20 (!) 160/80  03/10/20 (!) 167/71  02/18/20 (!) 174/64   Wt Readings from Last 3 Encounters:  04/20/20 160 lb (72.6 kg)  03/10/20 159 lb 9.6 oz (72.4 kg)  02/18/20 167 lb (75.8 kg)   Body mass index is 22.32 kg/m.   Physical Exam    Constitutional: Appears well-developed and well-nourished. No distress.  HENT:  Head: Normocephalic and atraumatic.  Neck: Neck supple. No tracheal deviation present. No thyromegaly present.  No cervical lymphadenopathy Cardiovascular: Normal rate, regular rhythm and normal heart sounds.   No murmur heard. No carotid bruit .  No edema Pulmonary/Chest: Effort normal and breath sounds normal. No respiratory distress. No has no wheezes. No rales.  Skin: Skin is warm and dry. Not diaphoretic.  Psychiatric: Normal mood and affect. Behavior is normal.      Assessment & Plan:    See Problem List for Assessment and Plan of chronic medical problems.    This visit occurred during the SARS-CoV-2 public health emergency.  Safety protocols were in place, including screening questions prior to the visit, additional usage of staff PPE, and extensive cleaning of exam room while observing appropriate contact time as indicated for disinfecting solutions.

## 2020-04-20 ENCOUNTER — Ambulatory Visit (INDEPENDENT_AMBULATORY_CARE_PROVIDER_SITE_OTHER): Payer: Medicare Other | Admitting: Internal Medicine

## 2020-04-20 ENCOUNTER — Encounter: Payer: Self-pay | Admitting: Internal Medicine

## 2020-04-20 ENCOUNTER — Other Ambulatory Visit: Payer: Self-pay

## 2020-04-20 VITALS — BP 160/80 | HR 60 | Temp 98.2°F | Ht 71.0 in | Wt 160.0 lb

## 2020-04-20 DIAGNOSIS — N182 Chronic kidney disease, stage 2 (mild): Secondary | ICD-10-CM

## 2020-04-20 DIAGNOSIS — G479 Sleep disorder, unspecified: Secondary | ICD-10-CM | POA: Diagnosis not present

## 2020-04-20 DIAGNOSIS — F419 Anxiety disorder, unspecified: Secondary | ICD-10-CM

## 2020-04-20 DIAGNOSIS — I700 Atherosclerosis of aorta: Secondary | ICD-10-CM

## 2020-04-20 DIAGNOSIS — N1832 Chronic kidney disease, stage 3b: Secondary | ICD-10-CM | POA: Diagnosis not present

## 2020-04-20 DIAGNOSIS — I10 Essential (primary) hypertension: Secondary | ICD-10-CM

## 2020-04-20 DIAGNOSIS — J439 Emphysema, unspecified: Secondary | ICD-10-CM

## 2020-04-20 DIAGNOSIS — E1122 Type 2 diabetes mellitus with diabetic chronic kidney disease: Secondary | ICD-10-CM

## 2020-04-20 DIAGNOSIS — E782 Mixed hyperlipidemia: Secondary | ICD-10-CM

## 2020-04-20 LAB — COMPREHENSIVE METABOLIC PANEL
ALT: 11 U/L (ref 0–53)
AST: 14 U/L (ref 0–37)
Albumin: 4.2 g/dL (ref 3.5–5.2)
Alkaline Phosphatase: 60 U/L (ref 39–117)
BUN: 24 mg/dL — ABNORMAL HIGH (ref 6–23)
CO2: 27 mEq/L (ref 19–32)
Calcium: 9.7 mg/dL (ref 8.4–10.5)
Chloride: 105 mEq/L (ref 96–112)
Creatinine, Ser: 1.52 mg/dL — ABNORMAL HIGH (ref 0.40–1.50)
GFR: 41.32 mL/min — ABNORMAL LOW (ref >60.00)
Glucose, Bld: 98 mg/dL (ref 70–99)
Potassium: 4.8 mEq/L (ref 3.5–5.1)
Sodium: 139 mEq/L (ref 135–145)
Total Bilirubin: 0.6 mg/dL (ref 0.2–1.2)
Total Protein: 7.5 g/dL (ref 6.0–8.3)

## 2020-04-20 LAB — LIPID PANEL
Cholesterol: 131 mg/dL (ref 0–200)
HDL: 47.2 mg/dL (ref >39.00)
LDL Cholesterol: 66 mg/dL (ref 0–99)
NonHDL: 84.09
Total CHOL/HDL Ratio: 3
Triglycerides: 88 mg/dL (ref 0.0–149.0)
VLDL: 17.6 mg/dL (ref 0.0–40.0)

## 2020-04-20 LAB — CBC WITH DIFFERENTIAL/PLATELET
Basophils Absolute: 0.1 10*3/uL (ref 0.0–0.1)
Basophils Relative: 1 % (ref 0.0–3.0)
Eosinophils Absolute: 0.4 10*3/uL (ref 0.0–0.7)
Eosinophils Relative: 7.8 % — ABNORMAL HIGH (ref 0.0–5.0)
HCT: 40.2 % (ref 39.0–52.0)
Hemoglobin: 13.5 g/dL (ref 13.0–17.0)
Lymphocytes Relative: 27.6 % (ref 12.0–46.0)
Lymphs Abs: 1.5 10*3/uL (ref 0.7–4.0)
MCHC: 33.5 g/dL (ref 30.0–36.0)
MCV: 91.9 fl (ref 78.0–100.0)
Monocytes Absolute: 0.5 10*3/uL (ref 0.1–1.0)
Monocytes Relative: 9.8 % (ref 3.0–12.0)
Neutro Abs: 2.8 10*3/uL (ref 1.4–7.7)
Neutrophils Relative %: 53.8 % (ref 43.0–77.0)
Platelets: 175 10*3/uL (ref 150.0–400.0)
RBC: 4.38 Mil/uL (ref 4.22–5.81)
RDW: 13.3 % (ref 11.5–15.5)
WBC: 5.3 10*3/uL (ref 4.0–10.5)

## 2020-04-20 LAB — HEMOGLOBIN A1C: Hgb A1c MFr Bld: 6.4 % (ref 4.6–6.5)

## 2020-04-20 MED ORDER — MIRTAZAPINE 15 MG PO TABS
15.0000 mg | ORAL_TABLET | Freq: Every day | ORAL | 1 refills | Status: DC
Start: 2020-04-20 — End: 2020-07-08

## 2020-04-20 NOTE — Assessment & Plan Note (Signed)
Chronic ?Stable ?CMP ?

## 2020-04-20 NOTE — Assessment & Plan Note (Signed)
Chronic Continue simvastatin 40 mg daily Check lipid panel

## 2020-04-20 NOTE — Assessment & Plan Note (Signed)
Chronic Was taking temazepam, which was prescribed by the [place], but since starting Remeron his sleep has been good and he has not needed the temazepam Continue Remeron 15 mg nightly

## 2020-04-20 NOTE — Assessment & Plan Note (Signed)
Chronic Following with pulmonary Shortness of breath with exertion-it did get slightly worse after radiation for lung cancer Using Anoro daily-we will continue

## 2020-04-20 NOTE — Assessment & Plan Note (Signed)
Chronic Controlled with diet a1c

## 2020-04-20 NOTE — Assessment & Plan Note (Signed)
Chronic Check lipid panel  Continue simvastatin 40 mg daily Regular exercise and healthy diet encouraged

## 2020-04-20 NOTE — Assessment & Plan Note (Signed)
Chronic Improved Fairly stable He is still having some panic attacks, but they are less often-this started after his daughter died Continue Remeron 15 mg at bedtime

## 2020-04-20 NOTE — Assessment & Plan Note (Signed)
Chronic Blood pressure is elevated here, but typically is-his pressure is well controlled at home Continue enalapril 10 mg twice daily, metoprolol XL 12.5 mg twice daily CMP

## 2020-04-21 ENCOUNTER — Ambulatory Visit
Admission: RE | Admit: 2020-04-21 | Discharge: 2020-04-21 | Disposition: A | Discharge status: Home / Self Care | Payer: Medicare Other | Source: Ambulatory Visit | Attending: Radiation Oncology | Admitting: Radiation Oncology

## 2020-04-21 ENCOUNTER — Encounter: Payer: Self-pay | Admitting: Radiation Oncology

## 2020-04-21 ENCOUNTER — Other Ambulatory Visit: Payer: Self-pay

## 2020-04-21 VITALS — BP 145/65 | HR 70 | Temp 97.2°F | Resp 18 | Ht 71.0 in | Wt 162.4 lb

## 2020-04-21 DIAGNOSIS — Z79899 Other long term (current) drug therapy: Secondary | ICD-10-CM | POA: Diagnosis not present

## 2020-04-21 DIAGNOSIS — R911 Solitary pulmonary nodule: Secondary | ICD-10-CM | POA: Diagnosis not present

## 2020-04-21 DIAGNOSIS — Z923 Personal history of irradiation: Secondary | ICD-10-CM | POA: Insufficient documentation

## 2020-04-21 DIAGNOSIS — R918 Other nonspecific abnormal finding of lung field: Secondary | ICD-10-CM

## 2020-04-21 NOTE — Progress Notes (Signed)
Patient in for follow up. Denies any issues at this time. No cough. Feel good appetite good but is unable to gain weight. BP (!) 145/65 (BP Location: Left Arm, Patient Position: Sitting)   Pulse 70   Temp (!) 97.2 F (36.2 C) (Temporal)   Resp 18   Ht 5\' 11"  (1.803 m)   Wt 162 lb 6 oz (73.7 kg)   SpO2 93%   BMI 22.65 kg/m

## 2020-04-21 NOTE — Progress Notes (Signed)
Radiation Oncology         [phone] ________________________________  Name: Brian [name] MRN: [id number]  Date: 04/21/2020  DOB: [birth year]  Follow-Up Visit Note  CC: Binnie Rail, MD  Collene Gobble, MD    ICD-10-CM   1. Pulmonary nodules  R91.8 CT Chest Wo Contrast    Diagnosis: Pet positive Right upper lobe lung nodule, suspicious for primary bronchogenic carcinoma  Interval Since Last Radiation: One month and five days  Radiation Treatment Dates: 03/09/2020 through 03/16/2020  Site: Right lung Technique: IMRT Total Dose (Gy): 54/54 Dose per Fx (Gy): 18 Completed Fx: 3/3 Beam Energies: 6XFFF  Narrative:  The patient returns today for routine follow-up. No significant interval history since the end of treatment.  On review of systems, he reports feeling well with no changes in his breathing.  He does however feel he may need oxygen at home will make an appointment with his pulmonologist to be checked for this issue.Marland Kitchen He denies any pain within the chest significant cough or hemoptysis.Marland Kitchen                 ALLERGIES:  is allergic to meperidine hcl, gabapentin, and zolpidem tartrate.  Meds: Current Outpatient Medications  Medication Sig Dispense Refill  . ACCU-CHEK COMPACT STRIPS test strip TEST BLOOD SUGAR ONCE DAILY 100 each 1  . Aspirin 81 MG EC tablet Take 81 mg by mouth daily.    . cholecalciferol (VITAMIN D) 1000 UNITS tablet Take 3,000 Units by mouth daily.     . enalapril (VASOTEC) 10 MG tablet TAKE 1 TABLET TWICE A DAY 180 tablet 2  . LORazepam (ATIVAN) 0.5 MG tablet Take 1 tablet (0.5 mg total) by mouth every 8 (eight) hours. Take 1/2 hour prior to CT scan or radiation treatment for anxiety 10 tablet 0  . Magnesium Oxide 420 MG TABS Take 420 mg by mouth daily.     . metoprolol succinate (TOPROL-XL) 25 MG 24 hr tablet TAKE ONE-HALF (1/2) TABLET DAILY 45 tablet 3  . mirtazapine (REMERON) 15 MG tablet Take 1 tablet (15 mg total) by mouth at bedtime. 90 tablet  1  . Multiple Vitamins-Minerals (MULTIVITAL PO) Take 1 tablet by mouth daily.     Vladimir Faster Glycol-Propyl Glycol (SYSTANE) 0.4-0.3 % SOLN Place 1 drop into both eyes daily.    . simvastatin (ZOCOR) 40 MG tablet TAKE 1 TABLET DAILY 90 tablet 3  . triamcinolone cream (KENALOG) 0.1 % Apply 1 application topically daily as needed (Rash).    Marland Kitchen umeclidinium-vilanterol (ANORO ELLIPTA) 62.5-25 MCG/INH AEPB USE 1 INHALATION DAILY 120 each 2   No current facility-administered medications for this encounter.    Physical Findings: The patient is in no acute distress. Patient is alert and oriented.  height is 5\' 11"  (1.803 m) and weight is 162 lb 6 oz (73.7 kg). His temporal temperature is 97.2 F (36.2 C) (abnormal). His blood pressure is 145/65 (abnormal) and his pulse is 70. His respiration is 18 and oxygen saturation is 93%.  Lungs are clear to auscultation bilaterally. Heart has regular rate and rhythm. No palpable cervical, supraclavicular, or axillary adenopathy. Abdomen soft, non-tender, normal bowel sounds.   Lab Findings: Lab Results  Component Value Date   WBC 5.3 04/20/2020   HGB 13.5 04/20/2020   HCT 40.2 04/20/2020   MCV 91.9 04/20/2020   PLT 175.0 04/20/2020    Radiographic Findings: No results found.  Impression: Pet positive Right upper lobe lung nodule, suspicious for primary bronchogenic  carcinoma  The patient tolerated his SBRT well without any significant side effects  Plan: The patient will follow up with radiation oncology in 3 months.  Prior to this follow-up he will have a baseline chest CT scan.    ____________________________________   Blair Promise, PhD, MD  This document serves as a record of services personally performed by Gery Pray, MD. It was created on his behalf by Clerance Lav, a trained medical scribe. The creation of this record is based on the scribe's personal observations and the provider's statements to them. This document has been checked  and approved by the attending provider.

## 2020-04-21 NOTE — Progress Notes (Incomplete)
  Patient Name: Brian [name] MRN: [id number] DOB: October 17, 1931 Referring Physician: Baltazar Apo (Profile Not Attached) Date of Service: 03/16/2020 [facility], [place]                                                        End Of Treatment Note  Diagnoses: R91.1-Solitary pulmonary nodule  Cancer Staging: Pet positive Right upper lobe lung nodule, suspicious for primary bronchogenic carcinoma  Intent: Curative  Radiation Treatment Dates: 03/09/2020 through 03/16/2020  Site: Right lung Technique: IMRT Total Dose (Gy): 54/54 Dose per Fx (Gy): 18 Completed Fx: 3/3 Beam Energies: 6XFFF  Narrative: The patient tolerated radiation therapy relatively well. He did report some shortness of breath associated with COPD. He denied cough, pain, fatigue, and skin changes.  Plan: The patient will follow-up with radiation oncology in one month.  ________________________________________________   Blair Promise, PhD, MD  This document serves as a record of services personally performed by Gery Pray, MD. It was created on his behalf by Clerance Lav, a trained medical scribe. The creation of this record is based on the scribe's personal observations and the provider's statements to them. This document has been checked and approved by the attending provider.

## 2020-04-27 ENCOUNTER — Ambulatory Visit (INDEPENDENT_AMBULATORY_CARE_PROVIDER_SITE_OTHER): Payer: Medicare Other | Admitting: Podiatry

## 2020-04-27 ENCOUNTER — Other Ambulatory Visit: Payer: Self-pay

## 2020-04-27 DIAGNOSIS — F528 Other sexual dysfunction not due to a substance or known physiological condition: Secondary | ICD-10-CM | POA: Insufficient documentation

## 2020-04-27 DIAGNOSIS — E1159 Type 2 diabetes mellitus with other circulatory complications: Secondary | ICD-10-CM

## 2020-04-27 DIAGNOSIS — M79674 Pain in right toe(s): Secondary | ICD-10-CM

## 2020-04-27 DIAGNOSIS — H527 Unspecified disorder of refraction: Secondary | ICD-10-CM | POA: Insufficient documentation

## 2020-04-27 DIAGNOSIS — M79675 Pain in left toe(s): Secondary | ICD-10-CM

## 2020-04-27 DIAGNOSIS — B351 Tinea unguium: Secondary | ICD-10-CM

## 2020-04-27 DIAGNOSIS — I739 Peripheral vascular disease, unspecified: Secondary | ICD-10-CM

## 2020-04-27 DIAGNOSIS — I8390 Asymptomatic varicose veins of unspecified lower extremity: Secondary | ICD-10-CM | POA: Insufficient documentation

## 2020-04-27 DIAGNOSIS — J45909 Unspecified asthma, uncomplicated: Secondary | ICD-10-CM | POA: Insufficient documentation

## 2020-04-27 DIAGNOSIS — Z9189 Other specified personal risk factors, not elsewhere classified: Secondary | ICD-10-CM | POA: Insufficient documentation

## 2020-04-27 DIAGNOSIS — E119 Type 2 diabetes mellitus without complications: Secondary | ICD-10-CM | POA: Insufficient documentation

## 2020-04-27 DIAGNOSIS — N183 Chronic kidney disease, stage 3 unspecified: Secondary | ICD-10-CM

## 2020-04-27 DIAGNOSIS — M109 Gout, unspecified: Secondary | ICD-10-CM | POA: Insufficient documentation

## 2020-04-27 DIAGNOSIS — I259 Chronic ischemic heart disease, unspecified: Secondary | ICD-10-CM | POA: Insufficient documentation

## 2020-04-27 DIAGNOSIS — M1990 Unspecified osteoarthritis, unspecified site: Secondary | ICD-10-CM | POA: Insufficient documentation

## 2020-04-27 DIAGNOSIS — N400 Enlarged prostate without lower urinary tract symptoms: Secondary | ICD-10-CM | POA: Insufficient documentation

## 2020-05-03 ENCOUNTER — Telehealth: Payer: Self-pay | Admitting: Internal Medicine

## 2020-05-03 NOTE — Telephone Encounter (Signed)
Patient called and said that he has recently be diagnosed with lung cancer and is needing oxygen. He was wondering if a referral to pulmonary would be needed or if he could just call that office. Please advise   Phone: [phone] or [phone]

## 2020-05-03 NOTE — Telephone Encounter (Signed)
Patient last seen with pulmonary 11/21. Patient informed he can call and schedule appointment.

## 2020-05-26 ENCOUNTER — Ambulatory Visit (INDEPENDENT_AMBULATORY_CARE_PROVIDER_SITE_OTHER): Payer: Medicare Other

## 2020-05-26 ENCOUNTER — Ambulatory Visit (INDEPENDENT_AMBULATORY_CARE_PROVIDER_SITE_OTHER): Payer: Medicare Other | Admitting: Emergency Medicine

## 2020-05-26 ENCOUNTER — Encounter: Payer: Self-pay | Admitting: Emergency Medicine

## 2020-05-26 ENCOUNTER — Other Ambulatory Visit: Payer: Self-pay

## 2020-05-26 DIAGNOSIS — R911 Solitary pulmonary nodule: Secondary | ICD-10-CM | POA: Diagnosis not present

## 2020-05-26 DIAGNOSIS — J439 Emphysema, unspecified: Secondary | ICD-10-CM | POA: Diagnosis not present

## 2020-05-26 DIAGNOSIS — R0602 Shortness of breath: Secondary | ICD-10-CM | POA: Diagnosis not present

## 2020-05-26 DIAGNOSIS — R918 Other nonspecific abnormal finding of lung field: Secondary | ICD-10-CM

## 2020-05-26 NOTE — Patient Instructions (Signed)
Please continue your Anoro 1 inhalation once daily. Keep your albuterol available use 2 puffs when you need it for shortness of breath, chest tightness, wheezing. We will perform a walking oximetry today on room air Chest x-ray today to evaluate for possible radiation related pneumonitis Get your CT scan of the chest in June as planned by Dr. Sondra Come Follow with Dr Lamonte Sakai in 3 months or sooner if you have any problems.

## 2020-05-26 NOTE — Addendum Note (Signed)
Addended by: Collene Gobble on: 05/26/2020 11:23 AM   Modules accepted: Level of Service

## 2020-05-26 NOTE — Progress Notes (Addendum)
   Subjective:    Patient ID: Brian [name], male    DOB: Dec 08, 1931, 87 y.o.   MRN: [id number]  HPI  ROV 01/11/20 --follow-up visit for 87 year old gentleman with COPD, CAD/CABG, diabetes, hypertension.  He has severe obstructive lung disease managed on Anoro.  Also with bilateral upper lobe nodular disease.  We have been following with serial imaging.  Little change in size and appearance on his most recent scan from 06/23/2019.  We decided to follow with serial imaging.  CT scan of the chest done 12/25/2019 reviewed by me, shows interval enlargement of his spiculated 2.3 cm peripheral right upper lobe pulmonary nodule consistent with a slow-growing bronchogenic carcinoma.  His 1.3 cm left upper lobe pulmonary nodule is stable in size.  R OV 05/26/2020 --87 year old man with severe COPD, severe obstruction based on spirometry.  Also followed for bilateral upper lobe pulmonary nodular disease.  PMH also significant for hypertension, CAD/CABG, diabetes.  His right upper lobe pulmonary nodule was increasing in size and was PET positive so he underwent radiation therapy with Dr. Sondra Come, last treatment 03/16/2020.  We have been managing him on Anoro. Today he reports that he had cough around the time of his XRT, has improved. He is still having increased exertional SOB, difficulty taking the garbage, doing tasks around the house. No real wheeze or cough. He does still get some relief from albuterol.   MDM: Reviewed Rad Onc notes from 02/18/20, IM notes from 04/20/20.      Objective:   Physical Exam  Vitals:   05/26/20 1046  BP: 140/70  Pulse: 80  Temp: 97.6 F (36.4 C)  TempSrc: Temporal  SpO2: 93%  Weight: 161 lb (73 kg)  Height: 5\' 11"  (1.803 m)   Gen: Pleasant, thin, in no distress,  normal affect  ENT: No lesions,  mouth clear,  oropharynx clear, no postnasal drip  Neck: No JVD, no stridor  Lungs: No use of accessory muscles, no wheezing on normal respiration, no wheeze on forced  expiration. Faint L insp crackles.   Cardiovascular: RRR, heart sounds normal, no murmur or gallops, no peripheral edema  Musculoskeletal: No deformities, no cyanosis or clubbing  Neuro: alert, awake, non focal  Skin: Warm, no lesions or rash     Assessment & Plan:  Pulmonary nodules Right upper lobe pulmonary nodule, presumed non-small cell lung cancer that has been treated with SBRT (no biopsy).  He tolerated well, did have cough that is now resolved.  Unfortunately he has had progressive dyspnea since.  Question whether he may have some airway distortion, pneumonitis that is related to his XRT.  I will check a chest x-ray today, low threshold to perform his CT chest early.  Is currently planned for June.  He may need steroids if we see evidence for pneumonitis  COPD (chronic obstructive pulmonary disease) (HCC) Not currently wheezing, no other clinical signs of exacerbation.  I think for now we should continue the Anoro and the albuterol as needed.  We will perform walking oximetry to ensure that he does not desaturate.  If he qualifies for oxygen then we will obtain for him.  Baltazar Apo, MD, PhD 05/26/2020, 11:21 AM [facility] [phone] or if no answer [phone] f

## 2020-05-26 NOTE — Addendum Note (Signed)
Addended by: Lorretta Harp on: 05/26/2020 11:57 AM   Modules accepted: Orders

## 2020-05-26 NOTE — Assessment & Plan Note (Signed)
Right upper lobe pulmonary nodule, presumed non-small cell lung cancer that has been treated with SBRT (no biopsy).  He tolerated well, did have cough that is now resolved.  Unfortunately he has had progressive dyspnea since.  Question whether he may have some airway distortion, pneumonitis that is related to his XRT.  I will check a chest x-ray today, low threshold to perform his CT chest early.  Is currently planned for June.  He may need steroids if we see evidence for pneumonitis

## 2020-05-26 NOTE — Addendum Note (Signed)
Addended by: Lorretta Harp on: 05/26/2020 11:43 AM   Modules accepted: Orders

## 2020-05-26 NOTE — Assessment & Plan Note (Signed)
Not currently wheezing, no other clinical signs of exacerbation.  I think for now we should continue the Anoro and the albuterol as needed.  We will perform walking oximetry to ensure that he does not desaturate.  If he qualifies for oxygen then we will obtain for him.

## 2020-05-28 DIAGNOSIS — J439 Emphysema, unspecified: Secondary | ICD-10-CM | POA: Diagnosis not present

## 2020-06-03 ENCOUNTER — Other Ambulatory Visit: Payer: Self-pay | Admitting: Internal Medicine

## 2020-06-17 ENCOUNTER — Ambulatory Visit: Payer: Medicare Other | Admitting: Emergency Medicine

## 2020-06-27 DIAGNOSIS — J439 Emphysema, unspecified: Secondary | ICD-10-CM | POA: Diagnosis not present

## 2020-07-08 ENCOUNTER — Telehealth: Payer: Self-pay | Admitting: Internal Medicine

## 2020-07-08 ENCOUNTER — Other Ambulatory Visit: Payer: Self-pay

## 2020-07-08 MED ORDER — TEMAZEPAM 7.5 MG PO CAPS: 7.5000 mg | ORAL_CAPSULE | Freq: Every evening | ORAL | 0 refills | Status: DC | PRN

## 2020-07-08 NOTE — Telephone Encounter (Signed)
Prescription sent

## 2020-07-08 NOTE — Telephone Encounter (Signed)
temazepam (RESTORIL) 7.5 MG capsule [facility] [place], [place] Phone:  [phone]  Fax:  [phone]     Last seen- 03.02.22 Next apt-  08.31.22

## 2020-07-12 ENCOUNTER — Encounter: Payer: Self-pay | Admitting: Radiology

## 2020-07-21 ENCOUNTER — Ambulatory Visit (HOSPITAL_COMMUNITY)
Admission: RE | Admit: 2020-07-21 | Discharge: 2020-07-21 | Disposition: A | Discharge status: Home / Self Care | Payer: Medicare Other | Source: Ambulatory Visit | Attending: Radiation Oncology | Admitting: Radiation Oncology

## 2020-07-21 ENCOUNTER — Other Ambulatory Visit: Payer: Self-pay

## 2020-07-21 DIAGNOSIS — I728 Aneurysm of other specified arteries: Secondary | ICD-10-CM | POA: Diagnosis not present

## 2020-07-21 DIAGNOSIS — J438 Other emphysema: Secondary | ICD-10-CM | POA: Diagnosis not present

## 2020-07-21 DIAGNOSIS — R918 Other nonspecific abnormal finding of lung field: Secondary | ICD-10-CM | POA: Insufficient documentation

## 2020-07-21 DIAGNOSIS — I281 Aneurysm of pulmonary artery: Secondary | ICD-10-CM | POA: Diagnosis not present

## 2020-07-21 DIAGNOSIS — J432 Centrilobular emphysema: Secondary | ICD-10-CM | POA: Diagnosis not present

## 2020-07-22 NOTE — Progress Notes (Signed)
Radiation Oncology         [phone] ________________________________  Name: Brian [name] MRN: [id number]  Date: 07/25/2020  DOB: 1932-01-26  Follow-Up Visit Note  CC: Binnie Rail, MD  Collene Gobble, MD   Diagnosis:   Pet positiveRight upper lobe lung nodule, suspicious for primary bronchogenic carcinoma  Interval Since Last Radiation:  4 months, 1 weeks, 4 days   Radiation Treatment Dates: 03/09/2020 through 03/16/2020  Site: Right lung Technique: SBRT Total Dose (Gy): 54/54 Dose per Fx (Gy): 18 Completed Fx: 3/3 Beam Energies: 6XFFF  Narrative:  The patient returns today for routine follow-up.  Since the patient was last seen for his follow-up visit on 04/21/20, the patient received a chest x-ray on 05/26/20 during a follow up visit with Dr. Lamonte Sakai at [place].  Results showed that the right upper lobe nodularity seemed to be overlying the right anterior fourth rib, (although it was not as clearly visualized compared to prior CT and PET scan).  Dr. Lamonte Sakai also noted during the visit that the patient reported having progressive dyspnea.  On 07/21/20, the patient underwent a chest CT. Results from CT showed decreased size of the spiculated right upper lobe pulmonary Neoplasm ( pulmonary nodule measures 1.8 x 1.2 cm, previously measured 2.3 x 1.7 cm) as well as stable mildly paratracheal adenopathy that is nonspecific . There was no evidence of new or progressive disease involvement in the thorax. The irregular solid left upper lobe pulmonary nodule was stable and appeared to be non-hypermetabolic on PET-CT. Severe centrilobular emphysema was also noted with mild diffuse bronchial wall thickening.       Allergies:  is allergic to meperidine hcl, gabapentin, zolpidem, and zolpidem tartrate.  Meds: Current Outpatient Medications  Medication Sig Dispense Refill  . ACCU-CHEK COMPACT STRIPS test strip TEST BLOOD SUGAR ONCE DAILY 100 each 1  . albuterol (VENTOLIN  HFA) 108 (90 Base) MCG/ACT inhaler INHALE 2 PUFFS BY MOUTH FOUR TIMES A DAY    . Aspirin 81 MG EC tablet Take 81 mg by mouth daily.    . cholecalciferol (VITAMIN D) 1000 UNITS tablet Take 3,000 Units by mouth daily.     . enalapril (VASOTEC) 10 MG tablet TAKE 1 TABLET TWICE A DAY 180 tablet 3  . Magnesium Oxide 420 MG TABS Take 420 mg by mouth daily.     . metoprolol succinate (TOPROL-XL) 25 MG 24 hr tablet TAKE ONE-HALF (1/2) TABLET DAILY 45 tablet 3  . Multiple Vitamins-Minerals (MULTIVITAL PO) Take 1 tablet by mouth daily.     Vladimir Faster Glycol-Propyl Glycol (SYSTANE) 0.4-0.3 % SOLN Place 1 drop into both eyes daily.    . simvastatin (ZOCOR) 40 MG tablet TAKE 1 TABLET DAILY 90 tablet 3  . temazepam (RESTORIL) 7.5 MG capsule Take 1 capsule (7.5 mg total) by mouth at bedtime as needed for sleep. 90 capsule 0  . triamcinolone cream (KENALOG) 0.1 % Apply 1 application topically daily as needed (Rash).    Marland Kitchen umeclidinium-vilanterol (ANORO ELLIPTA) 62.5-25 MCG/INH AEPB USE 1 INHALATION DAILY 120 each 2   No current facility-administered medications for this encounter.    Physical Findings: The patient is in no acute distress. Patient is alert and oriented.  height is 5\' 9"  (1.753 m) and weight is 159 lb (72.1 kg). His temperature is 98.2 F (36.8 C). His blood pressure is 177/62 (abnormal) and his pulse is 63. His respiration is 20 and oxygen saturation is 98%. .  No significant changes.  Lungs are clear to auscultation bilaterally. Heart has regular rate and rhythm. No palpable cervical, supraclavicular, or axillary adenopathy. Abdomen soft, non-tender, normal bowel sounds. Patient has oxygen in place at 2 L by nasal cannula.  Lab Findings: Lab Results  Component Value Date   WBC 5.3 04/20/2020   HGB 13.5 04/20/2020   HCT 40.2 04/20/2020   MCV 91.9 04/20/2020   PLT 175.0 04/20/2020    Radiographic Findings: CT Chest Wo Contrast  Result Date: 07/21/2020 CLINICAL DATA:  Non-small cell  lung cancer, non-metastatic, assess treatment response EXAM: CT CHEST WITHOUT CONTRAST TECHNIQUE: Multidetector CT imaging of the chest was performed following the standard protocol without IV contrast. COMPARISON:  Multiple priors including most recent chest CT December 25, 2019 and PET-CT January 26, 2020. FINDINGS: Cardiovascular: Aortic and branch vessel atherosclerosis without aneurysmal dilation. Stable dilation of the main pulmonary artery measuring 3.5 cm. Prior median sternotomy and CABG with dense calcifications of the native coronary vessels. Normal size heart. No significant pericardial effusion/thickening. Mediastinum/Nodes: No discrete thyroid nodularity. No axillary lymphadenopathy. Unchanged size of the mildly enlarged 1 cm precarinal lymph node on image 77/2. No new pathologically enlarged mediastinal lymph nodes. No discrete hilar adenopathy within the constraints of noncontrast examination. The trachea and esophagus are grossly unremarkable. Lungs/Pleura: Severe centrilobular emphysema with mild diffuse bronchial wall thickening. No pleural effusion. No pneumothorax. No acute airspace consolidation. Decreased size of the spiculated right upper lobe pulmonary nodule now measuring 1.8 x 1.2 cm in maximum axial dimensions on image 76/5 previously 2.3 x 1.7 cm. No significant change in size of the irregular solid left upper lobe pulmonary nodule which measures 1.2 x 0.7 cm in maximum axial dimension on image 42/5 previously 1.3 x 0.7 cm. No new suspicious pulmonary nodules or masses. Upper Abdomen: Nonobstructive right upper pole renal stone. Partially visualized aortic stent graft. Musculoskeletal: No aggressive lytic or blastic lesion of bone. Prior median sternotomy. IMPRESSION: 1. Decreased size of the spiculated right upper lobe pulmonary neoplasm. 2. Stable mildly paratracheal adenopathy, nonspecific. No evidence of new or progressive disease involvement in the thorax. 3. Stable irregular solid  left upper lobe pulmonary nodule which was non hypermetabolic on PET-CT. 4. Severe centrilobular emphysema with mild diffuse bronchial wall thickening. 5. Stable dilation of the main pulmonary artery, suggestive of chronic pulmonary arterial hypertension. 6. Emphysema and aortic atherosclerosis. Aortic Atherosclerosis (ICD10-I70.0) and Emphysema (ICD10-J43.9). Electronically Signed   By: Dahlia Bailiff MD   On: 07/21/2020 14:59    Impression:  Pet positiveRight upper lobe lung nodule, suspicious for primary bronchogenic carcinoma  The patient has had a favorable response to his SBRT as evidence from recent chest CT scan.  He reports his breathing been stable at this time without any pain within the chest significant cough or hemoptysis.   Plan: Routine follow-up in 6 months.  Prior to this visit patient will undergo another chest CT scan.   20 minutes of total time was spent for this patient encounter, including preparation, face-to-face counseling with the patient and coordination of care, physical exam, and documentation of the encounter and ordering of upcoming scans. ____________________________________  Blair Promise, PhD, MD   This document serves as a record of services personally performed by Gery Pray, MD. It was created on his behalf by Roney Mans, a trained medical scribe. The creation of this record is based on the scribe's personal observations and the provider's statements to them. This document has been checked and approved by the attending provider.

## 2020-07-25 ENCOUNTER — Ambulatory Visit
Admission: RE | Admit: 2020-07-25 | Discharge: 2020-07-25 | Disposition: A | Discharge status: Home / Self Care | Payer: Medicare Other | Source: Ambulatory Visit | Attending: Radiation Oncology | Admitting: Radiation Oncology

## 2020-07-25 ENCOUNTER — Other Ambulatory Visit: Payer: Self-pay

## 2020-07-25 ENCOUNTER — Encounter: Payer: Self-pay | Admitting: Radiation Oncology

## 2020-07-25 DIAGNOSIS — Z79899 Other long term (current) drug therapy: Secondary | ICD-10-CM | POA: Diagnosis not present

## 2020-07-25 DIAGNOSIS — J432 Centrilobular emphysema: Secondary | ICD-10-CM | POA: Diagnosis not present

## 2020-07-25 DIAGNOSIS — Z7982 Long term (current) use of aspirin: Secondary | ICD-10-CM | POA: Diagnosis not present

## 2020-07-25 DIAGNOSIS — Z08 Encounter for follow-up examination after completed treatment for malignant neoplasm: Secondary | ICD-10-CM | POA: Diagnosis not present

## 2020-07-25 DIAGNOSIS — Z923 Personal history of irradiation: Secondary | ICD-10-CM | POA: Insufficient documentation

## 2020-07-25 DIAGNOSIS — R911 Solitary pulmonary nodule: Secondary | ICD-10-CM | POA: Diagnosis not present

## 2020-07-25 DIAGNOSIS — R918 Other nonspecific abnormal finding of lung field: Secondary | ICD-10-CM

## 2020-07-25 NOTE — Progress Notes (Signed)
Brian [name] is here today for follow up post radiation to the lung.  Lung Side: Right, completed on 03/16/20  Does the patient complain of any of the following: . Pain: Patient denies any pain.  Marland Kitchen Shortness of breath w/wo exertion: Patient reports shortness of breath on exertion, patient  Currently on oxygen 2 liters via nasal cannula.  . Cough: Patient reports having a productive cough.  . Hemoptysis: no . Pain with swallowing: no . Swallowing/choking concerns: Patient reports feeling like there is a lump in his chest.  . Appetite: Good . Energy Level: good energy level . Post radiation skin Changes: no    Additional comments if applicable:  Vitals:   16/10/96 1124  BP: (!) 177/62  Pulse: 63  Resp: 20  Temp: 98.2 F (36.8 C)  SpO2: 98%  Weight: 159 lb (72.1 kg)  Height: 5\' 9"  (1.753 m)  PF: (!) 2 L/min

## 2020-07-28 DIAGNOSIS — J439 Emphysema, unspecified: Secondary | ICD-10-CM | POA: Diagnosis not present

## 2020-07-29 ENCOUNTER — Ambulatory Visit (INDEPENDENT_AMBULATORY_CARE_PROVIDER_SITE_OTHER): Payer: Medicare Other | Admitting: Podiatry

## 2020-07-29 ENCOUNTER — Other Ambulatory Visit: Payer: Self-pay

## 2020-07-29 ENCOUNTER — Encounter: Payer: Self-pay | Admitting: Podiatry

## 2020-07-29 DIAGNOSIS — E1159 Type 2 diabetes mellitus with other circulatory complications: Secondary | ICD-10-CM | POA: Diagnosis not present

## 2020-07-29 DIAGNOSIS — I739 Peripheral vascular disease, unspecified: Secondary | ICD-10-CM

## 2020-07-29 DIAGNOSIS — B351 Tinea unguium: Secondary | ICD-10-CM | POA: Diagnosis not present

## 2020-07-29 DIAGNOSIS — M79674 Pain in right toe(s): Secondary | ICD-10-CM

## 2020-07-29 DIAGNOSIS — N183 Chronic kidney disease, stage 3 unspecified: Secondary | ICD-10-CM | POA: Diagnosis not present

## 2020-07-29 DIAGNOSIS — M79675 Pain in left toe(s): Secondary | ICD-10-CM

## 2020-07-29 NOTE — Progress Notes (Signed)
This patient returns to my office for at risk foot care.  This patient requires this care by a professional since this patient will be at risk due to having chronic kidney disease and diabetes.  This patient is unable to cut nails himself since the patient cannot reach his nails.These nails are painful walking and wearing shoes.  This patient presents for at risk foot care today.  General Appearance  Alert, conversant and in no acute stress.  Vascular  Dorsalis pedis and posterior tibial  pulses are absent   bilaterally.  Capillary return is within normal limits  bilaterally. Temperature is within normal limits  Bilaterally. Venous stasis  Left leg/foot.  Neurologic  Senn-Weinstein monofilament wire test within normal limits  bilaterally. Muscle power within normal limits bilaterally.  Nails Thick disfigured discolored nails with subungual debris  from hallux to fifth toes bilaterally. No evidence of bacterial infection or drainage bilaterally.  Orthopedic  No limitations of motion  feet .  No crepitus or effusions noted.  No bony pathology or digital deformities noted.  HAV  B/L.  Skin  normotropic skin with no porokeratosis noted bilaterally.  No signs of infections or ulcers noted.     Onychomycosis  Pain in right toes  Pain in left toes  Consent was obtained for treatment procedures.   Mechanical debridement of nails 1-5  bilaterally performed with a nail nipper.  Filed with dremel without incident.    Return office visit  3 months                    Told patient to return for periodic foot care and evaluation due to potential at risk complications.   Gardiner Barefoot DPM

## 2020-08-27 DIAGNOSIS — J439 Emphysema, unspecified: Secondary | ICD-10-CM | POA: Diagnosis not present

## 2020-09-27 DIAGNOSIS — J439 Emphysema, unspecified: Secondary | ICD-10-CM | POA: Diagnosis not present

## 2020-10-18 ENCOUNTER — Encounter: Payer: Self-pay | Admitting: Internal Medicine

## 2020-10-18 NOTE — Progress Notes (Signed)
Subjective:    Patient ID: Brian [name], male    DOB: 01-19-1932, 87 y.o.   MRN: [id number]  HPI The patient is here for follow up of their chronic medical problems, including DM, htn, hichol, CKD, anxiety, sleep difficulties, COPD  H/o presumed lung cancer s/p radiation.    His BP is controlled at home.  SBP at home 140.    He is on oxygen now.  The anoro is not helping and he wonders if he can stop it.   Still having panic attacks - but not has often - he has had one in the past 2 weeks.  He was afraid to shower with the curtain closed but is over that.   He sometimes has diff swallowing pills.  He also gets afraid he will not be able to have a BM.    Appetite is not where it used to.  He could eat more.  He drinks a protein drink daily.      Medications and allergies reviewed with patient and updated if appropriate.  Patient Active Problem List   Diagnosis Date Noted   Asthma 04/27/2020   Asymptomatic varicose veins 04/27/2020   Chronic ischemic heart disease 04/27/2020   Diabetes mellitus type 2 without retinopathy ([place]) 04/27/2020   Gout 04/27/2020   Hypertrophy of prostate without urinary obstruction and other lower urinary tract symptoms (LUTS) 04/27/2020   Osteoarthrosis 04/27/2020   Other personal history presenting hazards to health 04/27/2020   Psychosexual dysfunction with inhibited sexual excitement 04/27/2020   Refractive error 04/27/2020   Aortic atherosclerosis ([place]) 04/20/2020   Trigger finger of right thumb 10/15/2019   Anxiety 10/15/2019   Pain due to onychomycosis of toenails of both feet 07/17/2019   Type 2 diabetes mellitus with vascular disease ([place]) 07/17/2019   Left upper arm pain 05/11/2019   Pulmonary nodules 04/27/2019   Nerve pain 10/15/2018   Low vitamin B12 level 09/30/2018   LUQ pain 09/30/2018   Arthritis of carpometacarpal (CMC) joint of thumb 09/30/2018   Chest pain 01/29/2018   Decreased hearing of right ear 11/23/2017    Vertigo 07/17/2016   Nocturnal leg cramps 09/20/2015   COPD (chronic obstructive pulmonary disease) ([place]) 05/31/2015   CKD (chronic kidney disease) stage 3, GFR 30-59 ml/min (HCC) 05/31/2015   Diabetes ([place]) 04/26/2015   AAA (abdominal aortic aneurysm) without rupture ([place]) 01/18/2015   PAD (peripheral artery disease) ([place]) 01/18/2015   Hereditary and idiopathic peripheral neuropathy 10/29/2012   Hypertension 05/10/2011   CAD (coronary artery disease) 09/05/2010   TINNITUS 04/04/2010   CORONARY ARTERY BYPASS GRAFT, HX OF 03/31/2010   Disturbance in sleep behavior 12/28/2008   Cervicalgia 07/26/2008   POLYP, COLON 07/16/2007   Hyperlipidemia 09/24/2006   History of gout 09/23/2006    Current Outpatient Medications on File Prior to Visit  Medication Sig Dispense Refill   ACCU-CHEK COMPACT STRIPS test strip TEST BLOOD SUGAR ONCE DAILY 100 each 1   albuterol (VENTOLIN HFA) 108 (90 Base) MCG/ACT inhaler INHALE 2 PUFFS BY MOUTH FOUR TIMES A DAY     Aspirin 81 MG EC tablet Take 81 mg by mouth daily.     cholecalciferol (VITAMIN D) 1000 UNITS tablet Take 3,000 Units by mouth daily.      enalapril (VASOTEC) 10 MG tablet TAKE 1 TABLET TWICE A DAY 180 tablet 3   LORazepam (ATIVAN) 0.5 MG tablet Take 0.5 mg by mouth as needed for anxiety.     Magnesium Oxide 420 MG TABS  Take 420 mg by mouth daily.      metoprolol succinate (TOPROL-XL) 25 MG 24 hr tablet TAKE ONE-HALF (1/2) TABLET DAILY 45 tablet 3   Multiple Vitamins-Minerals (MULTIVITAL PO) Take 1 tablet by mouth daily.      Polyethyl Glycol-Propyl Glycol (SYSTANE) 0.4-0.3 % SOLN Place 1 drop into both eyes daily.     simvastatin (ZOCOR) 40 MG tablet TAKE 1 TABLET DAILY 90 tablet 3   triamcinolone cream (KENALOG) 0.1 % Apply 1 application topically daily as needed (Rash).     umeclidinium-vilanterol (ANORO ELLIPTA) 62.5-25 MCG/INH AEPB USE 1 INHALATION DAILY 120 each 2   No current facility-administered medications on file prior to visit.     Past Medical History:  Diagnosis Date   AAA (abdominal aortic aneurysm) ([place])    Arthritis    Asthma 1976   Bronchiolitis May 2016   CAD (coronary artery disease)    NSTEMI 02/2010;  s/p CABG 1/12 (L-LAD, SOM1, S-PDA); echo in 08/2010: Mild LVH, EF 25%, grade 2 diastolic dysfunction, mild MR, mild LAE, mildly reduced RV function, mild RAE, PASP 42   Carpal tunnel syndrome of left wrist    Colon polyps    COPD (chronic obstructive pulmonary disease) (HCC)    Diabetes mellitus 2000   Type II, controlls with diet   Dyspnea    ED (erectile dysfunction)    Gout    History of radiation therapy 03/09/2020-03/16/2020   SBRT right pulmonary nodules: Dr. Gery Pray   HTN (hypertension)    Hyperlipidemia    Inguinal hernia    Right   Macular degeneration    Myocardial infarction [facility]) 1 -13-2012   Neuropathy     Past Surgical History:  Procedure Laterality Date   3 vessel CABG  03/03/2010   ABDOMINAL AORTIC ENDOVASCULAR STENT GRAFT N/A 01/21/2019   Procedure: ABDOMINAL AORTIC ENDOVASCULAR STENT GRAFT;  Surgeon: Waynetta Sandy, MD;  Location: [facility];  Service: Vascular;  Laterality: N/A;   cataract left eye     COLONOSCOPY     neg 2009   COLONOSCOPY  2014   5 mm sessile polyp; AVM   CORONARY ARTERY BYPASS GRAFT  2012   X3   ENDOVASCULAR REPAIR/STENT GRAFT  01/21/2019   ABDOMINAL AORTIC ENDOVASCULAR STENT GRAFT (N/A )   EYE SURGERY     Left cataract   HERNIA REPAIR     INCISION AND DRAINAGE PERIRECTAL ABSCESS     INGUINAL HERNIA REPAIR      Social History   Socioeconomic History   Marital status: Widowed    Spouse name: Not on file   Number of children: 3   Years of education: 86   Highest education level: Not on file  Occupational History   Occupation: RETIRED    Employer: RETIRED  Tobacco Use   Smoking status: Former    Packs/day: 1.00    Years: 40.00    Pack years: 40.00    Types: Cigarettes    Quit date: 02/20/2003    Years since quitting: 17.6    Smokeless tobacco: Never  Vaping Use   Vaping Use: Never used  Substance and Sexual Activity   Alcohol use: Not Currently    Alcohol/week: 6.0 standard drinks    Types: 3 Glasses of wine, 3 Shots of liquor per week    Comment:  1-3 drinks a week   Drug use: No   Sexual activity: Not on file  Other Topics Concern   Not on file  Social History  Narrative   Quit smoking in 1998. 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    One story home   Right handed   3 children   Social Determinants of Health   Financial Resource Strain: Not on file  Food Insecurity: Not on file  Transportation Needs: Not on file  Physical Activity: Not on file  Stress: Not on file  Social Connections: Not on file    Family History  Problem Relation Age of Onset   Heart failure Mother    Heart disease Mother        CHF   Heart attack Mother    Hypertension Mother    Heart attack Father    Heart disease Father 28       MI   Hypertension Father    Heart attack Sister 98   Heart disease Sister 42       MI   Diabetes Sister    Hyperlipidemia Sister    Hypertension Sister    Varicose Veins Sister    Heart attack Brother 25   Diabetes Brother    Heart disease Brother 4       MI   Cancer Brother    Hypertension Brother    Deep vein thrombosis Daughter    Leukemia Daughter    Other Son        varicose veins   Arthritis/Rheumatoid Son    Lupus Daughter    Colon cancer Neg Hx     Review of Systems  Constitutional:  Negative for fever.  Respiratory:  Positive for shortness of breath. Negative for cough and wheezing.   Cardiovascular:  Positive for palpitations (with anxiety). Negative for chest pain and leg swelling.  Gastrointestinal:  Positive for constipation. Negative for abdominal pain and nausea.  Neurological:  Negative for light-headedness and headaches.      Objective:   Vitals:   10/19/20 0908  BP: (!) 150/80  Pulse: 70  Temp: 98 F (36.7 C)  SpO2: 94%   BP Readings from  Last 3 Encounters:  10/19/20 (!) 150/80  07/25/20 (!) 177/62  05/26/20 140/70   Wt Readings from Last 3 Encounters:  10/19/20 159 lb 12.8 oz (72.5 kg)  07/25/20 159 lb (72.1 kg)  05/26/20 161 lb (73 kg)   Body mass index is 23.6 kg/m.   Physical Exam    Constitutional: Chronically ill-appearing on oxygen via nasal cannula. No distress.  HENT:  Head: Normocephalic and atraumatic.  Neck: Neck supple. No tracheal deviation present. No thyromegaly present.  No cervical lymphadenopathy Cardiovascular: Normal rate, regular rhythm and normal heart sounds.   No murmur heard. No carotid bruit .  No edema Pulmonary/Chest: Effort normal and breath sounds normal. No respiratory distress. No has no wheezes. No rales.  Skin: Skin is warm and dry. Not diaphoretic.  Psychiatric: Normal mood and affect. Behavior is normal.      Assessment & Plan:   Flu vaccine today  See Problem List for Assessment and Plan of chronic medical problems.    This visit occurred during the SARS-CoV-2 public health emergency.  Safety protocols were in place, including screening questions prior to the visit, additional usage of staff PPE, and extensive cleaning of exam room while observing appropriate contact time as indicated for disinfecting solutions.

## 2020-10-18 NOTE — Patient Instructions (Addendum)
    Blood work was ordered.     Medications changes include :  none   Your prescription(s) have been submitted to your pharmacy. Please take as directed and contact our office if you believe you are having problem(s) with the medication(s).   Please followup in 6 months  

## 2020-10-19 ENCOUNTER — Other Ambulatory Visit: Payer: Self-pay

## 2020-10-19 ENCOUNTER — Ambulatory Visit (INDEPENDENT_AMBULATORY_CARE_PROVIDER_SITE_OTHER): Payer: Medicare Other | Admitting: Internal Medicine

## 2020-10-19 VITALS — BP 150/80 | HR 70 | Temp 98.0°F | Ht 69.0 in | Wt 159.8 lb

## 2020-10-19 DIAGNOSIS — E782 Mixed hyperlipidemia: Secondary | ICD-10-CM | POA: Diagnosis not present

## 2020-10-19 DIAGNOSIS — Z23 Encounter for immunization: Secondary | ICD-10-CM

## 2020-10-19 DIAGNOSIS — I10 Essential (primary) hypertension: Secondary | ICD-10-CM

## 2020-10-19 DIAGNOSIS — N182 Chronic kidney disease, stage 2 (mild): Secondary | ICD-10-CM

## 2020-10-19 DIAGNOSIS — F419 Anxiety disorder, unspecified: Secondary | ICD-10-CM

## 2020-10-19 DIAGNOSIS — G479 Sleep disorder, unspecified: Secondary | ICD-10-CM | POA: Diagnosis not present

## 2020-10-19 DIAGNOSIS — N1832 Chronic kidney disease, stage 3b: Secondary | ICD-10-CM

## 2020-10-19 DIAGNOSIS — E1122 Type 2 diabetes mellitus with diabetic chronic kidney disease: Secondary | ICD-10-CM | POA: Diagnosis not present

## 2020-10-19 LAB — COMPREHENSIVE METABOLIC PANEL
ALT: 10 U/L (ref 0–53)
AST: 15 U/L (ref 0–37)
Albumin: 4.4 g/dL (ref 3.5–5.2)
Alkaline Phosphatase: 59 U/L (ref 39–117)
BUN: 26 mg/dL — ABNORMAL HIGH (ref 6–23)
CO2: 27 mEq/L (ref 19–32)
Calcium: 9.5 mg/dL (ref 8.4–10.5)
Chloride: 104 mEq/L (ref 96–112)
Creatinine, Ser: 1.51 mg/dL — ABNORMAL HIGH (ref 0.40–1.50)
GFR: 41.5 mL/min — ABNORMAL LOW (ref >60.00)
Glucose, Bld: 95 mg/dL (ref 70–99)
Potassium: 4.1 mEq/L (ref 3.5–5.1)
Sodium: 140 mEq/L (ref 135–145)
Total Bilirubin: 0.6 mg/dL (ref 0.2–1.2)
Total Protein: 7.6 g/dL (ref 6.0–8.3)

## 2020-10-19 LAB — CBC WITH DIFFERENTIAL/PLATELET
Basophils Absolute: 0 10*3/uL (ref 0.0–0.1)
Basophils Relative: 0.7 % (ref 0.0–3.0)
Eosinophils Absolute: 0.2 10*3/uL (ref 0.0–0.7)
Eosinophils Relative: 3.4 % (ref 0.0–5.0)
HCT: 38.2 % — ABNORMAL LOW (ref 39.0–52.0)
Hemoglobin: 12.6 g/dL — ABNORMAL LOW (ref 13.0–17.0)
Lymphocytes Relative: 23.7 % (ref 12.0–46.0)
Lymphs Abs: 1.1 10*3/uL (ref 0.7–4.0)
MCHC: 33.1 g/dL (ref 30.0–36.0)
MCV: 93.1 fl (ref 78.0–100.0)
Monocytes Absolute: 0.4 10*3/uL (ref 0.1–1.0)
Monocytes Relative: 8.9 % (ref 3.0–12.0)
Neutro Abs: 3 10*3/uL (ref 1.4–7.7)
Neutrophils Relative %: 63.3 % (ref 43.0–77.0)
Platelets: 153 10*3/uL (ref 150.0–400.0)
RBC: 4.1 Mil/uL — ABNORMAL LOW (ref 4.22–5.81)
RDW: 13.7 % (ref 11.5–15.5)
WBC: 4.7 10*3/uL (ref 4.0–10.5)

## 2020-10-19 LAB — HEMOGLOBIN A1C: Hgb A1c MFr Bld: 6.3 % (ref 4.6–6.5)

## 2020-10-19 MED ORDER — MIRTAZAPINE 15 MG PO TABS
15.0000 mg | ORAL_TABLET | Freq: Every day | ORAL | 1 refills | Status: DC
Start: 2020-10-19 — End: 2021-04-18

## 2020-10-19 NOTE — Assessment & Plan Note (Addendum)
Chronic BP well controlled at home, but elevated here today as it usually is high at the doctor's office Advised that he bring in his BP cuff to his next appt Continue enalapril 10 mg twice daily, metoprolol XL 12.5 mg daily cmp

## 2020-10-19 NOTE — Assessment & Plan Note (Signed)
Chronic Lipids have been well controlled Continue simvastatin 40 mg daily

## 2020-10-19 NOTE — Assessment & Plan Note (Signed)
Chronic Lab Results  Component Value Date   HGBA1C 6.4 04/20/2020   Diet controlled Check A1c today

## 2020-10-19 NOTE — Assessment & Plan Note (Signed)
Chronic CMP 

## 2020-10-19 NOTE — Assessment & Plan Note (Addendum)
Chronic Still having panic attacks and anxiety Remeron worked better than restoril so will resume remeron 15 mg HS

## 2020-10-19 NOTE — Assessment & Plan Note (Addendum)
Chronic Was on Restoril 7.5 mg nightly and that was working well, but started having increased anxiety and he would like to go back on mirtazapine 15 mg nightly, which seemed to help that more Restart mirtazapine 15 mg nightly

## 2020-10-28 DIAGNOSIS — J439 Emphysema, unspecified: Secondary | ICD-10-CM | POA: Diagnosis not present

## 2020-11-01 NOTE — Progress Notes (Signed)
Subjective:    Patient ID: Brian [name], male    DOB: 31-Aug-1931, 87 y.o.   MRN: [id number]  This visit occurred during the SARS-CoV-2 public health emergency.  Safety protocols were in place, including screening questions prior to the visit, additional usage of staff PPE, and extensive cleaning of exam room while observing appropriate contact time as indicated for disinfecting solutions.    HPI The patient is here for an acute visit.   Back and hip pain - he dropped his bluetooth a few days ago and picked it up and felt lower back pain in his right side and the pain radiates to the groin. Has not taken anything for it.  No pain down leg.  No N/T.  Some weakness in leg when he first stands.  No prior epidoses.    Laying on left side is better.  Getting up from a sitting position really hurts.  Pain is a moderate -severe in nature.     Medications and allergies reviewed with patient and updated if appropriate.  Patient Active Problem List   Diagnosis Date Noted   Asthma 04/27/2020   Asymptomatic varicose veins 04/27/2020   Chronic ischemic heart disease 04/27/2020   Diabetes mellitus type 2 without retinopathy ([place]) 04/27/2020   Gout 04/27/2020   Hypertrophy of prostate without urinary obstruction and other lower urinary tract symptoms (LUTS) 04/27/2020   Osteoarthrosis 04/27/2020   Other personal history presenting hazards to health 04/27/2020   Psychosexual dysfunction with inhibited sexual excitement 04/27/2020   Refractive error 04/27/2020   Aortic atherosclerosis ([place]) 04/20/2020   Trigger finger of right thumb 10/15/2019   Anxiety 10/15/2019   Pain due to onychomycosis of toenails of both feet 07/17/2019   Left upper arm pain 05/11/2019   Pulmonary nodules 04/27/2019   Nerve pain 10/15/2018   Low vitamin B12 level 09/30/2018   LUQ pain 09/30/2018   Arthritis of carpometacarpal (CMC) joint of thumb 09/30/2018   Chest pain 01/29/2018   Decreased hearing of right  ear 11/23/2017   Vertigo 07/17/2016   Nocturnal leg cramps 09/20/2015   COPD (chronic obstructive pulmonary disease) ([place]) 05/31/2015   CKD (chronic kidney disease) stage 3, GFR 30-59 ml/min ([facility]) 05/31/2015   Diabetes ([place]) 04/26/2015   AAA (abdominal aortic aneurysm) without rupture ([place]) 01/18/2015   PAD (peripheral artery disease) ([place]) 01/18/2015   Hereditary and idiopathic peripheral neuropathy 10/29/2012   Hypertension 05/10/2011   CAD (coronary artery disease) 09/05/2010   TINNITUS 04/04/2010   CORONARY ARTERY BYPASS GRAFT, HX OF 03/31/2010   Disturbance in sleep behavior 12/28/2008   Cervicalgia 07/26/2008   POLYP, COLON 07/16/2007   Hyperlipidemia 09/24/2006   History of gout 09/23/2006    Current Outpatient Medications on File Prior to Visit  Medication Sig Dispense Refill   ACCU-CHEK COMPACT STRIPS test strip TEST BLOOD SUGAR ONCE DAILY 100 each 1   albuterol (VENTOLIN HFA) 108 (90 Base) MCG/ACT inhaler INHALE 2 PUFFS BY MOUTH FOUR TIMES A DAY     Aspirin 81 MG EC tablet Take 81 mg by mouth daily.     cholecalciferol (VITAMIN D) 1000 UNITS tablet Take 3,000 Units by mouth daily.      enalapril (VASOTEC) 10 MG tablet TAKE 1 TABLET TWICE A DAY 180 tablet 3   LORazepam (ATIVAN) 0.5 MG tablet Take 0.5 mg by mouth as needed for anxiety.     Magnesium Oxide 420 MG TABS Take 420 mg by mouth daily.      metoprolol succinate (  TOPROL-XL) 25 MG 24 hr tablet TAKE ONE-HALF (1/2) TABLET DAILY 45 tablet 3   mirtazapine (REMERON) 15 MG tablet Take 1 tablet (15 mg total) by mouth at bedtime. 90 tablet 1   Multiple Vitamins-Minerals (MULTIVITAL PO) Take 1 tablet by mouth daily.      Polyethyl Glycol-Propyl Glycol (SYSTANE) 0.4-0.3 % SOLN Place 1 drop into both eyes daily.     simvastatin (ZOCOR) 40 MG tablet TAKE 1 TABLET DAILY 90 tablet 3   triamcinolone cream (KENALOG) 0.1 % Apply 1 application topically daily as needed ([place]ash).     umeclidinium-vilanterol (ANORO ELLIPTA) 62.5-25  MCG/INH AEPB USE 1 INHALATION DAILY 120 each 2   No current facility-administered medications on file prior to visit.    Past Medical History:  Diagnosis Date   AAA (abdominal aortic aneurysm) ([place])    Arthritis    Asthma 1976   Bronchiolitis May 2016   CAD (coronary artery disease)    NSTEMI 02/2010;  s/p CABG 1/12 (L-LAD, SOM1, S-PDA); echo in 08/2010: Mild LVH, EF 03%, grade 2 diastolic dysfunction, mild MR, mild LAE, mildly reduced RV function, mild RAE, PASP 42   Carpal tunnel syndrome of left wrist    Colon polyps    COPD (chronic obstructive pulmonary disease) (HCC)    Diabetes mellitus 2000   Type II, controlls with diet   Dyspnea    ED (erectile dysfunction)    Gout    History of radiation therapy 03/09/2020-03/16/2020   SBRT right pulmonary nodules: Dr. Gery Pray   HTN (hypertension)    Hyperlipidemia    Inguinal hernia    Right   Macular degeneration    Myocardial infarction [facility]) 1 -13-2012   Neuropathy     Past Surgical History:  Procedure Laterality Date   3 vessel CABG  03/03/2010   ABDOMINAL AORTIC ENDOVASCULAR STENT GRAFT N/A 01/21/2019   Procedure: ABDOMINAL AORTIC ENDOVASCULAR STENT GRAFT;  Surgeon: Waynetta Sandy, MD;  Location: [facility];  Service: Vascular;  Laterality: N/A;   cataract left eye     COLONOSCOPY     neg 2009   COLONOSCOPY  2014   5 mm sessile polyp; AVM   CORONARY ARTERY BYPASS GRAFT  2012   X3   ENDOVASCULAR REPAIR/STENT GRAFT  01/21/2019   ABDOMINAL AORTIC ENDOVASCULAR STENT GRAFT (N/A )   EYE SURGERY     Left cataract   HERNIA REPAIR     INCISION AND DRAINAGE PERIRECTAL ABSCESS     INGUINAL HERNIA REPAIR      Social History   Socioeconomic History   Marital status: Widowed    Spouse name: Not on file   Number of children: 3   Years of education: 33   Highest education level: Not on file  Occupational History   Occupation: RETIRED    Employer: RETIRED  Tobacco Use   Smoking status: Former    Packs/day:  1.00    Years: 40.00    Pack years: 40.00    Types: Cigarettes    Quit date: 02/20/2003    Years since quitting: 17.7   Smokeless tobacco: Never  Vaping Use   Vaping Use: Never used  Substance and Sexual Activity   Alcohol use: Not Currently    Alcohol/week: 6.0 standard drinks    Types: 3 Glasses of wine, 3 Shots of liquor per week    Comment:  1-3 drinks a week   Drug use: No   Sexual activity: Not on file  Other Topics Concern  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    One story home   Right handed   3 children   Social Determinants of Health   Financial Resource Strain: Not on file  Food Insecurity: Not on file  Transportation Needs: Not on file  Physical Activity: Not on file  Stress: Not on file  Social Connections: Not on file    Family History  Problem Relation Age of Onset   Heart failure Mother    Heart disease Mother        CHF   Heart attack Mother    Hypertension Mother    Heart attack Father    Heart disease Father 10       MI   Hypertension Father    Heart attack Sister 15   Heart disease Sister 6       MI   Diabetes Sister    Hyperlipidemia Sister    Hypertension Sister    Varicose Veins Sister    Heart attack Brother 29   Diabetes Brother    Heart disease Brother 51       MI   Cancer Brother    Hypertension Brother    Deep vein thrombosis Daughter    Leukemia Daughter    Other Son        varicose veins   Arthritis/Rheumatoid Son    Lupus Daughter    Colon cancer Neg Hx     Review of Systems     Objective:   Vitals:   11/02/20 0908  BP: (!) 146/82  Pulse: 81  Temp: 98.3 F (36.8 C)  SpO2: 97%   BP Readings from Last 3 Encounters:  11/02/20 (!) 146/82  10/19/20 (!) 150/80  07/25/20 (!) 177/62   Wt Readings from Last 3 Encounters:  11/02/20 159 lb (72.1 kg)  10/19/20 159 lb 12.8 oz (72.5 kg)  07/25/20 159 lb (72.1 kg)   Body mass index is 23.48  kg/m.   Physical Exam Constitutional:      General: He is not in acute distress (Appears uncomfortable-standing).    Appearance: Normal appearance. He is not ill-appearing.  HENT:     Head: Normocephalic and atraumatic.  Musculoskeletal:        General: No tenderness (No tenderness along lumbar spine or in right lower back) or deformity.     Right lower leg: No edema.     Left lower leg: No edema.  Skin:    General: Skin is warm and dry.  Neurological:     Mental Status: He is alert.     Sensory: No sensory deficit.     Motor: No weakness.     Gait: Gait abnormal.           Assessment & Plan:    See Problem List for Assessment and Plan of chronic medical problems.

## 2020-11-02 ENCOUNTER — Encounter: Payer: Self-pay | Admitting: Internal Medicine

## 2020-11-02 ENCOUNTER — Other Ambulatory Visit: Payer: Self-pay

## 2020-11-02 ENCOUNTER — Ambulatory Visit (INDEPENDENT_AMBULATORY_CARE_PROVIDER_SITE_OTHER): Payer: Medicare Other

## 2020-11-02 ENCOUNTER — Ambulatory Visit (INDEPENDENT_AMBULATORY_CARE_PROVIDER_SITE_OTHER): Payer: Medicare Other | Admitting: Internal Medicine

## 2020-11-02 VITALS — BP 146/82 | HR 81 | Temp 98.3°F | Ht 69.0 in | Wt 159.0 lb

## 2020-11-02 DIAGNOSIS — M5441 Lumbago with sciatica, right side: Secondary | ICD-10-CM

## 2020-11-02 DIAGNOSIS — M545 Low back pain, unspecified: Secondary | ICD-10-CM | POA: Diagnosis not present

## 2020-11-02 MED ORDER — TRAMADOL HCL 50 MG PO TABS
50.0000 mg | ORAL_TABLET | Freq: Three times a day (TID) | ORAL | 0 refills | Status: DC | PRN
Start: 2020-11-02 — End: 2021-04-17

## 2020-11-02 MED ORDER — PREDNISONE 20 MG PO TABS: 40.0000 mg | ORAL_TABLET | Freq: Every day | ORAL | 0 refills | Status: DC

## 2020-11-02 NOTE — Assessment & Plan Note (Signed)
Acute Started after he went to pick something up off the floor Right lower back pain radiating into the right groin Some leg weakness when he first stands, no pain extending down the leg and no numbness/tingling Lumbar spine x-ray Prednisone 40 mg daily x5 days Tramadol 50 mg every 8 hours as needed for severe pain-discussed side effects that are possible Advised Tylenol 1000 mg 3 times daily for pain and take tramadol only if pain is severe Will refer to sports medicine for further evaluation

## 2020-11-02 NOTE — Patient Instructions (Signed)
    Have an xray today downstairs.   Make an appointment with sports medicine downstairs  - referral has been ordered.    Medications changes include :   prednisone 40 mg daily for 5 days, tramadol 50 mg three times a day for pain.  You can take tylenol 1000 mg three times as well.    Your prescription(s) have been submitted to your pharmacy. Please take as directed and contact our office if you believe you are having problem(s) with the medication(s).

## 2020-11-04 ENCOUNTER — Other Ambulatory Visit: Payer: Self-pay

## 2020-11-04 ENCOUNTER — Encounter: Payer: Self-pay | Admitting: Podiatry

## 2020-11-04 ENCOUNTER — Ambulatory Visit (INDEPENDENT_AMBULATORY_CARE_PROVIDER_SITE_OTHER): Payer: Medicare Other | Admitting: Podiatry

## 2020-11-04 DIAGNOSIS — N183 Chronic kidney disease, stage 3 unspecified: Secondary | ICD-10-CM

## 2020-11-04 DIAGNOSIS — B351 Tinea unguium: Secondary | ICD-10-CM

## 2020-11-04 DIAGNOSIS — M79675 Pain in left toe(s): Secondary | ICD-10-CM | POA: Diagnosis not present

## 2020-11-04 DIAGNOSIS — M79674 Pain in right toe(s): Secondary | ICD-10-CM | POA: Diagnosis not present

## 2020-11-04 DIAGNOSIS — I739 Peripheral vascular disease, unspecified: Secondary | ICD-10-CM

## 2020-11-04 DIAGNOSIS — E1159 Type 2 diabetes mellitus with other circulatory complications: Secondary | ICD-10-CM

## 2020-11-04 NOTE — Progress Notes (Signed)
This patient returns to my office for at risk foot care.  This patient requires this care by a professional since this patient will be at risk due to having chronic kidney disease and diabetes.  This patient is unable to cut nails himself since the patient cannot reach his nails.These nails are painful walking and wearing shoes.  This patient presents for at risk foot care today.  General Appearance  Alert, conversant and in no acute stress.  Vascular  Dorsalis pedis and posterior tibial  pulses are absent   bilaterally.  Capillary return is within normal limits  bilaterally. Temperature is within normal limits  Bilaterally. Venous stasis  Left leg/foot.  Neurologic  Senn-Weinstein monofilament wire test within normal limits  bilaterally. Muscle power within normal limits bilaterally.  Nails Thick disfigured discolored nails with subungual debris  from hallux to fifth toes bilaterally. No evidence of bacterial infection or drainage bilaterally.  Orthopedic  No limitations of motion  feet .  No crepitus or effusions noted.  No bony pathology or digital deformities noted.  HAV  B/L.  Skin  normotropic skin with no porokeratosis noted bilaterally.  No signs of infections or ulcers noted.     Onychomycosis  Pain in right toes  Pain in left toes  Consent was obtained for treatment procedures.   Mechanical debridement of nails 1-5  bilaterally performed with a nail nipper.  Filed with dremel without incident.    Return office visit  3 months                    Told patient to return for periodic foot care and evaluation due to potential at risk complications.   Gardiner Barefoot DPM

## 2020-11-07 ENCOUNTER — Ambulatory Visit (INDEPENDENT_AMBULATORY_CARE_PROVIDER_SITE_OTHER): Payer: Medicare Other | Admitting: Family Medicine

## 2020-11-07 ENCOUNTER — Other Ambulatory Visit: Payer: Self-pay

## 2020-11-07 VITALS — BP 164/92 | HR 59 | Ht 69.0 in | Wt 157.0 lb

## 2020-11-07 DIAGNOSIS — M5441 Lumbago with sciatica, right side: Secondary | ICD-10-CM | POA: Diagnosis not present

## 2020-11-07 NOTE — Progress Notes (Signed)
    Subjective:    CC: R-sided lower back pain  I, Molly Weber, LAT, ATC, am serving as scribe for Dr. Lynne Leader.  HPI: Pt is an 87 y/o male presenting w/ c/o R-sided lower back x one week that began when he bent down to pick his bluetooth off the floor. Pain was previously radiating into R groin, but this has resolved. Pt notes the LBP has gotten better, and now is only slightly hurting.  Radiating pain: no- previously into his R groin LE numbness/tingling: no Aggravating factors: Transitioning from sit to stand;  Treatments tried: Tramadol; prednisone; Tylenol  Diagnostic testing: L-spine XR- 11/02/20  Pertinent review of Systems: No fevers or chills  Relevant historical information: Vascular disease.  Diabetes.   Objective:    Vitals:   11/07/20 1022  BP: (!) 164/92  Pulse: (!) 59  SpO2: 99%   General: Well Developed, well nourished, and in no acute distress.   MSK: L-spine nontender midline decreased lumbar motion.  Lower extremity strength is intact.  Lab and Radiology Results    EXAM: LUMBAR SPINE - COMPLETE 4+ VIEW   COMPARISON:  CT abdomen pelvis 02/24/2019   FINDINGS: There are 5 non-rib-bearing lumbar vertebrae. Aortoiliac stent overlies the lumbar spine on the frontal view. There is no evidence of lumbar spine fracture. There is mild multilevel degenerative disc disease, worst at L3-L4. There is moderate lower lumbar predominant facet arthropathy. Mild straightening of the lumbar lordosis without significant listhesis. Bilateral SI joint and hip degenerative change.   IMPRESSION: Mild multilevel degenerative disc disease, worst at L3-L4.   Moderate lower lumbar predominant facet arthropathy.     Electronically Signed   By: Maurine Simmering M.D.   On: 11/03/2020 10:03   I, Lynne Leader, personally (independently) visualized and performed the interpretation of the images attached in this note.   Impression and Recommendations:    Assessment  and Plan: 87 y.o. male with lumbar back pain with possible right-sided L1 or L2 radiculopathy.  Pain improved with steroid Dosepak especially the potential radicular pain to the groin. Plan to refer to PT to work on core strengthening.  Check back in a month.  If not improved consider MRI.  If worsening will order MRI sooner.  Discussed potential for other medications including gabapentin or muscle relaxers.  Both of these are sedating and will increase her risk of falls.  Would like to avoid them if possible, but will prescribe if needed.  Patient will let me know sooner if he has a problem.  PDMP not reviewed this encounter. Orders Placed This Encounter  Procedures   Ambulatory referral to Physical Therapy    Referral Priority:   Routine    Referral Type:   Physical Medicine    Referral Reason:   Specialty Services Required    Requested Specialty:   Physical Therapy    Number of Visits Requested:   1   No orders of the defined types were placed in this encounter.   Discussed warning signs or symptoms. Please see discharge instructions. Patient expresses understanding.   The above documentation has been reviewed and is accurate and complete Lynne Leader, M.D.

## 2020-11-07 NOTE — Patient Instructions (Addendum)
Thank you for coming in today.   I've referred you to Physical Therapy.  Let us know if you don't hear from them in one week.   Use a heating pad.   Let me know if you worsen.  I can prescribe more medicine.   Return in 4 weeks.  Please let me know if you have trouble.   Radicular Pain Radicular pain is a type of pain that spreads from your back or neck along a spinal nerve. Spinal nerves are nerves that leave the spinal cord and go to the muscles. Radicular pain is sometimes called radiculopathy, radiculitis, or a pinched nerve. When you have this type of pain, you may also have weakness, numbness, or tingling in the area of your body that is supplied by the nerve. The pain may feel sharp and burning. Depending on which spinal nerve is affected, the pain may occur in the: Neck area (cervical radicular pain). You may also feel pain, numbness, weakness, or tingling in the arms. Mid-spine area (thoracic radicular pain). You would feel this pain in the back and chest. This type is rare. Lower back area (lumbar radicular pain). You would feel this pain as low back pain. You may feel pain, numbness, weakness, or tingling in the buttocks or legs. Sciatica is a type of lumbar radicular pain that shoots down the back of the leg. Radicular pain occurs when one of the spinal nerves becomes irritated or squeezed (compressed). It is often caused by something pushing on a spinal nerve, such as one of the bones of the spine (vertebrae) or one of the round cushions between vertebrae (intervertebral disks). This can result from: An injury. Wear and tear or aging of a disk. The growth of a bone spur that pushes on the nerve. Radicular pain often goes away when you follow instructions from your health care provider for relieving pain at home. Follow these instructions at home: Managing pain   If directed, put ice on the affected area: Put ice in a plastic bag. Place a towel between your skin and the  bag. Leave the ice on for 20 minutes, 2-3 times a day. If directed, apply heat to the affected area as often as told by your health care provider. Use the heat source that your health care provider recommends, such as a moist heat pack or a heating pad. Place a towel between your skin and the heat source. Leave the heat on for 20-30 minutes. Remove the heat if your skin turns bright red. This is especially important if you are unable to feel pain, heat, or cold. You may have a greater risk of getting burned. Activity  Do not sit or rest in bed for long periods of time. Try to stay as active as possible. Ask your health care provider what type of exercise or activity is best for you. Avoid activities that make your pain worse, such as bending and lifting. Do not lift anything that is heavier than 10 lb (4.5 kg), or the limit that you are told, until your health care provider says that it is safe. Practice using proper technique when lifting items. Proper lifting technique involves bending your knees and rising up. Do strength and range-of-motion exercises only as told by your health care provider or physical therapist. General instructions Take over-the-counter and prescription medicines only as told by your health care provider. Pay attention to any changes in your symptoms. Keep all follow-up visits as told by your health care provider. This  is important. Your health care provider may send you to a physical therapist to help with this pain. Contact a health care provider if: Your pain and other symptoms get worse. Your pain medicine is not helping. Your pain has not improved after a few weeks of home care. You have a fever. Get help right away if: You have severe pain, weakness, or numbness. You have difficulty with bladder or bowel control. Summary Radicular pain is a type of pain that spreads from your back or neck along a spinal nerve. When you have radicular pain, you may also have  weakness, numbness, or tingling in the area of your body that is supplied by the nerve. The pain may feel sharp or burning. Radicular pain may be treated with ice, heat, medicines, or physical therapy. This information is not intended to replace advice given to you by your health care provider. Make sure you discuss any questions you have with your health care provider. Document Revised: 08/20/2017 Document Reviewed: 08/20/2017 Elsevier Patient Education  [place].

## 2020-11-08 ENCOUNTER — Ambulatory Visit (INDEPENDENT_AMBULATORY_CARE_PROVIDER_SITE_OTHER): Payer: Medicare Other | Admitting: Rehabilitative and Restorative Service Providers"

## 2020-11-08 ENCOUNTER — Encounter: Payer: Self-pay | Admitting: Rehabilitative and Restorative Service Providers"

## 2020-11-08 DIAGNOSIS — M545 Low back pain, unspecified: Secondary | ICD-10-CM | POA: Diagnosis not present

## 2020-11-08 NOTE — Therapy (Signed)
[facility] [address] [place], [place], [address] Phone: [phone]   Fax:  [phone]  Physical Therapy Evaluation /Discharge  Patient Details  Name: Brian [name] MRN: [id number] Date of Birth: March 26, 1931 Referring Provider (PT): Gregor Hams, MD   Encounter Date: 11/08/2020   PT End of Session - 11/08/20 0935     Visit Number 1    Number of Visits 1    Date for PT Re-Evaluation --    Authorization Type [employer], Medicare    Progress Note Due on Visit --    PT Start Time 0936    PT Stop Time 0954    PT Time Calculation (min) 18 min    Activity Tolerance Patient tolerated treatment well    Behavior During Therapy [facility] for tasks assessed/performed             Past Medical History:  Diagnosis Date   AAA (abdominal aortic aneurysm) ([place])    Arthritis    Asthma 1976   Bronchiolitis May 2016   CAD (coronary artery disease)    NSTEMI 02/2010;  s/p CABG 1/12 (L-LAD, SOM1, S-PDA); echo in 08/2010: Mild LVH, EF 57%, grade 2 diastolic dysfunction, mild MR, mild LAE, mildly reduced RV function, mild RAE, PASP 42   Carpal tunnel syndrome of left wrist    Colon polyps    COPD (chronic obstructive pulmonary disease) ([place])    Diabetes mellitus 2000   Type II, controlls with diet   Dyspnea    ED (erectile dysfunction)    Gout    History of radiation therapy 03/09/2020-03/16/2020   SBRT right pulmonary nodules: Dr. Gery Pray   HTN (hypertension)    Hyperlipidemia    Inguinal hernia    Right   Macular degeneration    Myocardial infarction [facility]) 1 -13-2012   Neuropathy     Past Surgical History:  Procedure Laterality Date   3 vessel CABG  03/03/2010   ABDOMINAL AORTIC ENDOVASCULAR STENT GRAFT N/A 01/21/2019   Procedure: ABDOMINAL AORTIC ENDOVASCULAR STENT GRAFT;  Surgeon: Waynetta Sandy, MD;  Location: [place];  Service: Vascular;  Laterality: N/A;   cataract left eye     COLONOSCOPY     neg 2009   COLONOSCOPY  2014    5 mm sessile polyp; AVM   CORONARY ARTERY BYPASS GRAFT  2012   X3   ENDOVASCULAR REPAIR/STENT GRAFT  01/21/2019   ABDOMINAL AORTIC ENDOVASCULAR STENT GRAFT (N/A )   EYE SURGERY     Left cataract   HERNIA REPAIR     INCISION AND DRAINAGE PERIRECTAL ABSCESS     INGUINAL HERNIA REPAIR      There were no vitals filed for this visit.    Subjective Assessment - 11/08/20 0939     Subjective Pt. indicated he dropped bluetooth to floor and tried to pick it up and had sharp pain.  Pt. stated it occured 1-2 weeks ago on a Friday and saw MD Monday.  Pt. stated symptoms seem to be better now.  Pt. stated he was given medication and was recommended for PT referral.  Pt. denied previous history of back pain. Pt. stated living alone.    Patient Stated Goals Cut grass    Currently in Pain? No/denies    Pain Score 0-No pain    Pain Location Back    Pain Orientation Lower    Pain Descriptors / Indicators Sharp    Pain Type Acute pain    Pain Onset 1 to  4 weeks ago    Pain Frequency Occasional    Aggravating Factors  sitting prolonged, lying down (at night)    Pain Relieving Factors medicine and time                [facility] PT Assessment - 11/08/20 0001       Assessment   Medical Diagnosis M54.41 (ICD-10-CM) - Acute bilateral low back pain with right-sided sciatica    Referring Provider (PT) Gregor Hams, MD    Onset Date/Surgical Date 10/21/20    Hand Dominance Right      Precautions   Precautions None      Balance Screen   Has the patient fallen in the past 6 months No    Has the patient had a decrease in activity level because of a fear of falling?  No    Is the patient reluctant to leave their home because of a fear of falling?  No      Prior Function   Level of Independence Independent    Vocation Requirements 1 in garage, 2 at front    Leisure Wants to cut grass, light house activity      Observation/Other Assessments   Focus on Therapeutic Outcomes (FOTO)  intake 54,  predicted 73      Posture/Postural Control   Posture Comments Reduced lumbar lordosis in standing      ROM / Strength   AROM / PROM / Strength Strength;PROM;AROM      AROM   AROM Assessment Site Lumbar;Hip    Lumbar Flexion to mid shin c mild pull in back    Lumbar Extension 50% WFL no complaints      Strength   Overall Strength Comments Myotomal check WFL    Strength Assessment Site Knee;Hip;Ankle    Right/Left Hip Right;Left    Right/Left Knee Left;Right    Right/Left Ankle Left;Right      Transfers   Comments Able to perform 18 inch chair sit to stand s UE assist                        Objective measurements completed on examination: See above findings.       [place] [facility] PT Treatment/Exercise - 11/08/20 0001       Exercises   Exercises Other Exercises    Other Exercises  HEP instruction/performance c cues for techniques, handout provided.  Trial set performed of each for comprehension and symptom assessment.  HEP consisting of lumbar extension, supine SKC bilateral, supine lower trunk rotation stretch                     PT Education - 11/08/20 0935     Education Details HEP, POC    Person(s) Educated Patient    Methods Explanation;Demonstration;Verbal cues;Handout    Comprehension Returned demonstration;Verbalized understanding              PT Short Term Goals - 11/08/20 0934       PT SHORT TERM GOAL #1   Title --    Time --    Period --    Status --    Target Date --               PT Long Term Goals - 11/08/20 2751       PT LONG TERM GOAL #1   Title Patient will demonstrate/report pain at worst less than or equal to 2/10 to facilitate minimal limitation in  daily activity secondary to pain symptoms.    Time 1    Period Days    Status Achieved    Target Date 11/08/20      PT LONG TERM GOAL #2   Title Patient will demonstrate independent use of home exercise program to facilitate ability to maintain/progress  functional gains from skilled physical therapy services.    Time 1    Period Days    Status Achieved    Target Date 11/08/20      PT LONG TERM GOAL #3   Title --    Time --    Period --    Status --    Target Date --      PT LONG TERM GOAL #4   Title --    Time --    Period --    Status --    Target Date --                    Plan - 11/08/20 0959     Clinical Impression Statement Patient is a 87 y.o. who comes to clinic with complaints of low back pain with mobility deficits that impair their ability to perform usual daily and recreational functional activities without increase difficulty/symptoms at this time.  Patient to benefit from skilled PT services to establish HEP to allow continued self care going forward.  Due to reduction of chief complaint, Pt. was appropriate for 1x visit c HEP at this time.    Personal Factors and Comorbidities Comorbidity 3+    Comorbidities CAD, AAA, COPD, DM, HTN, hyperlipidemia, history of MI 2012    Examination-Activity Limitations Stand;Bend    Stability/Clinical Decision Making Stable/Uncomplicated    Clinical Decision Making Low    Rehab Potential Good    PT Frequency One time visit    PT Treatment/Interventions ADLs/Self Care Home Management;Electrical Stimulation;Moist Heat;Balance training;Therapeutic exercise;Therapeutic activities;Functional mobility training;Stair training;Gait training;Patient/family education;Passive range of motion;Spinal Manipulations;Joint Manipulations;Dry needling;Taping;Manual techniques;Neuromuscular re-education    PT Next Visit Plan D/C to HEP this visit    PT Home Exercise Plan [id number]    Consulted and Agree with Plan of Care Patient             Patient will benefit from skilled therapeutic intervention in order to improve the following deficits and impairments:  Pain, Decreased mobility  Visit Diagnosis: Acute bilateral low back pain without sciatica     Problem List Patient  Active Problem List   Diagnosis Date Noted   Acute right-sided low back pain with right-sided sciatica 11/02/2020   Asthma 04/27/2020   Asymptomatic varicose veins 04/27/2020   Chronic ischemic heart disease 04/27/2020   Diabetes mellitus type 2 without retinopathy ([place]) 04/27/2020   Gout 04/27/2020   Hypertrophy of prostate without urinary obstruction and other lower urinary tract symptoms (LUTS) 04/27/2020   Osteoarthrosis 04/27/2020   Other personal history presenting hazards to health 04/27/2020   Psychosexual dysfunction with inhibited sexual excitement 04/27/2020   Refractive error 04/27/2020   Aortic atherosclerosis ([place]) 04/20/2020   Trigger finger of right thumb 10/15/2019   Anxiety 10/15/2019   Pain due to onychomycosis of toenails of both feet 07/17/2019   Left upper arm pain 05/11/2019   Pulmonary nodules 04/27/2019   Nerve pain 10/15/2018   Low vitamin B12 level 09/30/2018   LUQ pain 09/30/2018   Arthritis of carpometacarpal [facility]) joint of thumb 09/30/2018   Chest pain 01/29/2018   Decreased hearing of right ear 11/23/2017   Vertigo 07/17/2016  Nocturnal leg cramps 09/20/2015   COPD (chronic obstructive pulmonary disease) ([place]) 05/31/2015   CKD (chronic kidney disease) stage 3, GFR 30-59 ml/min ([facility]) 05/31/2015   Diabetes ([place]) 04/26/2015   AAA (abdominal aortic aneurysm) without rupture ([place]) 01/18/2015   PAD (peripheral artery disease) ([place]) 01/18/2015   Hereditary and idiopathic peripheral neuropathy 10/29/2012   Hypertension 05/10/2011   CAD (coronary artery disease) 09/05/2010   TINNITUS 04/04/2010   CORONARY ARTERY BYPASS GRAFT, HX OF 03/31/2010   Disturbance in sleep behavior 12/28/2008   Cervicalgia 07/26/2008   POLYP, COLON 07/16/2007   Hyperlipidemia 09/24/2006   History of gout 09/23/2006   Scot Jun, PT, DPT, OCS, ATC 11/08/20  10:02 AM    [facility] [address] [place], [place], [address] Phone:  [phone]   Fax:  [phone]  Name: Brian [name] MRN: [id number] Date of Birth: 04/07/1931

## 2020-11-25 ENCOUNTER — Ambulatory Visit (INDEPENDENT_AMBULATORY_CARE_PROVIDER_SITE_OTHER): Payer: Medicare Other | Admitting: Cardiovascular Disease

## 2020-11-25 ENCOUNTER — Encounter: Payer: Self-pay | Admitting: Cardiovascular Disease

## 2020-11-25 ENCOUNTER — Other Ambulatory Visit: Payer: Self-pay

## 2020-11-25 VITALS — BP 136/68 | HR 54 | Ht 69.0 in | Wt 158.0 lb

## 2020-11-25 DIAGNOSIS — I2510 Atherosclerotic heart disease of native coronary artery without angina pectoris: Secondary | ICD-10-CM | POA: Diagnosis not present

## 2020-11-25 DIAGNOSIS — I714 Abdominal aortic aneurysm, without rupture, unspecified: Secondary | ICD-10-CM

## 2020-11-25 DIAGNOSIS — E7800 Pure hypercholesterolemia, unspecified: Secondary | ICD-10-CM

## 2020-11-25 DIAGNOSIS — I10 Essential (primary) hypertension: Secondary | ICD-10-CM | POA: Diagnosis not present

## 2020-11-25 NOTE — Patient Instructions (Signed)

## 2020-11-25 NOTE — Progress Notes (Signed)
Chief Complaint  Patient presents with   Follow-up    CAD   History of Present Illness: 87 yo male with history of HTN, hyperlipidemia, DM, COPD and CAD s/p CABG here today for cardiac follow up. He was admitted to [facility] January 2012 with a NSTEMI. Cardiac cath January 2012 with severe triple vessel CAD. He underwent 3V CABG on 03/03/10. EVAR December 2020 per DR. Cain for 5.5 cm AAA. Echo March 2017 with normal LV size and function, mild MR. He has been diagnosed with lung cancer and has been treated with XRT.   He is here today for follow up. The patient denies any chest pain, palpitations, lower extremity edema, orthopnea, PND, dizziness, near syncope or syncope. He has baseline dyspnea. He is now on supplemental O2 continuously.   Primary Care Physician: Binnie Rail, MD  Past Medical History:  Diagnosis Date   AAA (abdominal aortic aneurysm)    Arthritis    Asthma 1976   Bronchiolitis May 2016   CAD (coronary artery disease)    NSTEMI 02/2010;  s/p CABG 1/12 (L-LAD, SOM1, S-PDA); echo in 08/2010: Mild LVH, EF 28%, grade 2 diastolic dysfunction, mild MR, mild LAE, mildly reduced RV function, mild RAE, PASP 42   Carpal tunnel syndrome of left wrist    Colon polyps    COPD (chronic obstructive pulmonary disease) ([facility])    Diabetes mellitus 2000   Type II, controlls with diet   Dyspnea    ED (erectile dysfunction)    Gout    History of radiation therapy 03/09/2020-03/16/2020   SBRT right pulmonary nodules: Dr. Gery Pray   HTN (hypertension)    Hyperlipidemia    Inguinal hernia    Right   Macular degeneration    Myocardial infarction [facility]) 1 -13-2012   Neuropathy     Past Surgical History:  Procedure Laterality Date   3 vessel CABG  03/03/2010   ABDOMINAL AORTIC ENDOVASCULAR STENT GRAFT N/A 01/21/2019   Procedure: ABDOMINAL AORTIC ENDOVASCULAR STENT GRAFT;  Surgeon: Waynetta Sandy, MD;  Location: [place];  Service: Vascular;  Laterality: N/A;    cataract left eye     COLONOSCOPY     neg 2009   COLONOSCOPY  2014   5 mm sessile polyp; AVM   CORONARY ARTERY BYPASS GRAFT  2012   X3   ENDOVASCULAR REPAIR/STENT GRAFT  01/21/2019   ABDOMINAL AORTIC ENDOVASCULAR STENT GRAFT (N/A )   EYE SURGERY     Left cataract   HERNIA REPAIR     INCISION AND DRAINAGE PERIRECTAL ABSCESS     INGUINAL HERNIA REPAIR      Current Outpatient Medications  Medication Sig Dispense Refill   ACCU-CHEK COMPACT STRIPS test strip TEST BLOOD SUGAR ONCE DAILY 100 each 1   albuterol (VENTOLIN HFA) 108 (90 Base) MCG/ACT inhaler INHALE 2 PUFFS BY MOUTH FOUR TIMES A DAY     Aspirin 81 MG EC tablet Take 81 mg by mouth daily.     cholecalciferol (VITAMIN D) 1000 UNITS tablet Take 3,000 Units by mouth daily.      enalapril (VASOTEC) 10 MG tablet TAKE 1 TABLET TWICE A DAY 180 tablet 3   LORazepam (ATIVAN) 0.5 MG tablet Take 0.5 mg by mouth as needed for anxiety.     Magnesium Oxide 420 MG TABS Take 420 mg by mouth daily.      metoprolol succinate (TOPROL-XL) 25 MG 24 hr tablet TAKE ONE-HALF (1/2) TABLET DAILY 45 tablet 3  mirtazapine (REMERON) 15 MG tablet Take 1 tablet (15 mg total) by mouth at bedtime. 90 tablet 1   Multiple Vitamins-Minerals (MULTIVITAL PO) Take 1 tablet by mouth daily.      Polyethyl Glycol-Propyl Glycol (SYSTANE) 0.4-0.3 % SOLN Place 1 drop into both eyes daily.     predniSONE (DELTASONE) 20 MG tablet Take 2 tablets (40 mg total) by mouth daily with breakfast. 10 tablet 0   simvastatin (ZOCOR) 40 MG tablet TAKE 1 TABLET DAILY 90 tablet 3   traMADol (ULTRAM) 50 MG tablet Take 1 tablet (50 mg total) by mouth every 8 (eight) hours as needed for severe pain. 15 tablet 0   triamcinolone cream (KENALOG) 0.1 % Apply 1 application topically daily as needed (Rash).     umeclidinium-vilanterol (ANORO ELLIPTA) 62.5-25 MCG/INH AEPB USE 1 INHALATION DAILY 120 each 2   No current facility-administered medications for this visit.    Allergies  Allergen  Reactions   Meperidine Hcl     Tongue swelling Because of a history of documented adverse serious drug reaction;Medi Alert bracelet  is recommended   Gabapentin     confusion   Meperidine     Other reaction(s): Angioedema of tongue   Zolpidem     Other reaction(s): Nightmares   Zolpidem Tartrate Other (See Comments)    Nightmares    Social History   Socioeconomic History   Marital status: Widowed    Spouse name: Not on file   Number of children: 3   Years of education: 13   Highest education level: Not on file  Occupational History   Occupation: RETIRED    Employer: RETIRED  Tobacco Use   Smoking status: Former    Packs/day: 1.00    Years: 40.00    Pack years: 40.00    Types: Cigarettes    Quit date: 02/20/2003    Years since quitting: 17.7   Smokeless tobacco: Never  Vaping Use   Vaping Use: Never used  Substance and Sexual Activity   Alcohol use: Not Currently    Alcohol/week: 6.0 standard drinks    Types: 3 Glasses of wine, 3 Shots of liquor per week    Comment:  1-3 drinks a week   Drug use: No   Sexual activity: Not on file  Other Topics Concern   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    One story home   Right handed   3 children   Social Determinants of Health   Financial Resource Strain: Not on file  Food Insecurity: Not on file  Transportation Needs: Not on file  Physical Activity: Not on file  Stress: Not on file  Social Connections: Not on file  Intimate Partner Violence: Not on file    Family History  Problem Relation Age of Onset   Heart failure Mother    Heart disease Mother        CHF   Heart attack Mother    Hypertension Mother    Heart attack Father    Heart disease Father 6       MI   Hypertension Father    Heart attack Sister 64   Heart disease Sister 67       MI   Diabetes Sister    Hyperlipidemia Sister    Hypertension Sister    Varicose Veins Sister     Heart attack Brother 2   Diabetes Brother    Heart  disease Brother 78       MI   Cancer Brother    Hypertension Brother    Deep vein thrombosis Daughter    Leukemia Daughter    Other Son        varicose veins   Arthritis/Rheumatoid Son    Lupus Daughter    Colon cancer Neg Hx     Review of Systems:  As stated in the HPI and otherwise negative.   BP 136/68   Pulse (!) 54   Ht 5\' 9"  (1.753 m)   Wt 158 lb (71.7 kg)   SpO2 95%   BMI 23.33 kg/m   Physical Examination:  General: Well developed, well nourished, NAD  HEENT: OP clear, mucus membranes moist  SKIN: warm, dry. No rashes. Neuro: No focal deficits  Musculoskeletal: Muscle strength 5/5 all ext  Psychiatric: Mood and affect normal  Neck: No JVD, no carotid bruits, no thyromegaly, no lymphadenopathy.  Lungs:Clear bilaterally, no wheezes, rhonci, crackles Cardiovascular: Loletha Grayer with ectopy. No murmurs, gallops or rubs. Abdomen:Soft. Bowel sounds present. Non-tender.  Extremities: No lower extremity edema. Pulses are 2 + in the bilateral DP/PT.  Echo March 2017: Left ventricle: The cavity size was normal. There was mild focal   basal hypertrophy of the septum. Systolic function was normal.   The estimated ejection fraction was in the range of 55% to 60%.   Wall motion was normal; there were no regional wall motion   abnormalities. Doppler parameters are consistent with abnormal   left ventricular relaxation (grade 1 diastolic dysfunction).   Doppler parameters are consistent with high ventricular filling   pressure. - Mitral valve: There was mild regurgitation. - Right ventricle: The cavity size was mildly dilated. - Right atrium: The atrium was mildly dilated.   Impressions:   - Normal LV systolic function; grade 1 diastolic dysfunction; mild   MR; mild RAE and RVE; trace TR.  EKG:  EKG is  ordered today. The ekg ordered today demonstrates sinus, RBBB, LAFB, PACs vs 2nd Degree AV block type 1  Recent  Labs: 10/19/2020: ALT 10; BUN 26; Creatinine, Ser 1.51; Hemoglobin 12.6; Platelets 153.0; Potassium 4.1; Sodium 140   Lipid Panel    Component Value Date/Time   CHOL 131 04/20/2020 0905   TRIG 88.0 04/20/2020 0905   HDL 47.20 04/20/2020 0905   CHOLHDL 3 04/20/2020 0905   VLDL 17.6 04/20/2020 0905   LDLCALC 66 04/20/2020 0905     Wt Readings from Last 3 Encounters:  11/25/20 158 lb (71.7 kg)  11/07/20 157 lb (71.2 kg)  11/02/20 159 lb (72.1 kg)     Other studies Reviewed: Additional studies/ records that were reviewed today include: . Review of the above records demonstrates:    Assessment and Plan:   1. CAD s/p CABG without angina: He has no chest pain. Continue ASA, statin and beta blocker.   2. HTN: BP is controlled. No changes today  3. Hyperlipidemia: LDL at goal in March 2022. Continue statin.   4. AAA: s/p EVAR in 2020. Followed in [facility] office  Current medicines are reviewed at length with the patient today.  The patient does not have concerns regarding medicines.  The following changes have been made:  no change  Labs/ tests ordered today include:   Orders Placed This Encounter  Procedures   EKG 12-Lead     Disposition:   F/U with me in 12  months  Signed, Lauree Chandler, MD 11/25/2020 3:29 PM    [facility] [place], [place], [place]  [address] Phone: [phone]; Fax: [phone]

## 2020-11-27 DIAGNOSIS — J439 Emphysema, unspecified: Secondary | ICD-10-CM | POA: Diagnosis not present

## 2020-12-05 ENCOUNTER — Ambulatory Visit: Payer: TRICARE For Life (TFL) | Admitting: Family Medicine

## 2020-12-20 ENCOUNTER — Telehealth: Payer: Self-pay

## 2020-12-20 NOTE — Telephone Encounter (Signed)
Received message from patients son, stating that patient would like to come in to see Dr. Sondra Come before his scheduled appointment on 12/8. Patient states he is having pain across his chest that is the same type of pain  he experienced when he was diagnosed with lung cancer. Patient denies any coughing,  shortness of breath or swallowing issues. Patient has scheduled CT for 01/24/21.

## 2020-12-21 NOTE — Progress Notes (Signed)
Radiation Oncology         (336) [phone] ________________________________  Name: Brian [name] MRN: [id number]  Date: 12/22/2020  DOB: [birth year]  Follow-Up Visit Note  CC: Binnie Rail, MD  Collene Gobble, MD    ICD-10-CM   1. Pulmonary nodules  R91.8       Diagnosis: Pet positive Right upper lobe lung nodule, suspicious for primary bronchogenic carcinoma  Interval Since Last Radiation:  9 months and 8 days    Radiation Treatment Dates: 03/09/2020 through 03/16/2020   Site: Right lung Technique: SBRT Total Dose (Gy): 54/54 Dose per Fx (Gy): 18 Completed Fx: 3/3 Beam Energies: 6XFFF  Narrative:  The patient returns today for routine follow-up, he was last seen here for follow-up on 07/25/20. Since his last visit, the patient presented to his PCP on 11/02/20 with acute back and hip pain brought on by bending over to pick up something. Patient reported the back pain to radiate to his groin, and some weakness in his leg upon standing up. Patient was prescribed tramadol and prednisone. Lumbar spine XR performed revealed mild multilevel degenerative disc disease, worse at L3-L4, and moderate lower lumbar predominant facet arthropathy. Patient was then referred to physical therapy. Radiating pain to groin has since resolved on its own, though he continues to have mild back pain.  Patient recently presented to Dr. Angelena Form, cardiology, for follow-up on 11/25/20. During which time, he denied any chest pain, palpitations, lower extremity edema, orthopnea, PND, dizziness, near syncope or syncope. Patient was noted to have continued baseline dyspnea, and remains on supplemental O2 continuously.   More recently the patient has noticed some sharp intense pains that originate in his left chest and radiate into his right upper abdominal area.  He reports that when his lung cancer was discovered this how he presented.  his concern is lung cancer may have recurred.  He denies any shortness of  breath significant cough or hemoptysis.  These episodes of sharp electrical type pain have only occurred 3 times over the past 2 to 3 weeks.  Patient is accompanied by his son on evaluation today.                                Allergies:  is allergic to meperidine hcl, gabapentin, meperidine, zolpidem, and zolpidem tartrate.  Meds: Current Outpatient Medications  Medication Sig Dispense Refill   ACCU-CHEK COMPACT STRIPS test strip TEST BLOOD SUGAR ONCE DAILY 100 each 1   albuterol (VENTOLIN HFA) 108 (90 Base) MCG/ACT inhaler INHALE 2 PUFFS BY MOUTH FOUR TIMES A DAY     Aspirin 81 MG EC tablet Take 81 mg by mouth daily.     cholecalciferol (VITAMIN D) 1000 UNITS tablet Take 3,000 Units by mouth daily.      enalapril (VASOTEC) 10 MG tablet TAKE 1 TABLET TWICE A DAY 180 tablet 3   LORazepam (ATIVAN) 0.5 MG tablet Take 0.5 mg by mouth as needed for anxiety.     Magnesium Oxide 420 MG TABS Take 420 mg by mouth daily.      metoprolol succinate (TOPROL-XL) 25 MG 24 hr tablet TAKE ONE-HALF (1/2) TABLET DAILY 45 tablet 3   mirtazapine (REMERON) 15 MG tablet Take 1 tablet (15 mg total) by mouth at bedtime. 90 tablet 1   Multiple Vitamins-Minerals (MULTIVITAL PO) Take 1 tablet by mouth daily.      Polyethyl Glycol-Propyl Glycol (SYSTANE) 0.4-0.3 % SOLN Place  1 drop into both eyes daily.     simvastatin (ZOCOR) 40 MG tablet TAKE 1 TABLET DAILY 90 tablet 3   triamcinolone cream (KENALOG) 0.1 % Apply 1 application topically daily as needed (Rash).     umeclidinium-vilanterol (ANORO ELLIPTA) 62.5-25 MCG/INH AEPB USE 1 INHALATION DAILY 120 each 2   traMADol (ULTRAM) 50 MG tablet Take 1 tablet (50 mg total) by mouth every 8 (eight) hours as needed for severe pain. (Patient not taking: Reported on 12/22/2020) 15 tablet 0   No current facility-administered medications for this encounter.    Physical Findings: The patient is in no acute distress. Patient is alert and oriented.  Supplemental oxygen in place  via nasal cannula at 2 L.  height is 5\' 11"  (1.803 m) and weight is 156 lb 6.4 oz (70.9 kg). His temperature is 97.7 F (36.5 C). His blood pressure is 163/64 (abnormal) and his pulse is 65. His respiration is 20 and oxygen saturation is 95%. .  No significant changes. Lungs are clear to auscultation bilaterally. Heart has regular rate and rhythm. No palpable cervical, supraclavicular, or axillary adenopathy. Abdomen soft, non-tender, normal bowel sounds.  Peripheral pulses strong and regular.   Lab Findings: Lab Results  Component Value Date   WBC 4.7 10/19/2020   HGB 12.6 (L) 10/19/2020   HCT 38.2 (L) 10/19/2020   MCV 93.1 10/19/2020   PLT 153.0 10/19/2020    Radiographic Findings: No results found.  Impression:  Pet positive Right upper lobe lung nodule, suspicious for primary bronchogenic carcinoma  Patient is quite concerned with his symptoms.  I discussed that these are not typical signs for lung cancer recurrence but will move up his chest CT scan to within the next 1 to 2 weeks to assess for any potential changes.   No evidence of recurrence on clinical exam today.  Plan: Chest CT scan without contrast.  Patient does have renal insufficiency so will avoid IV contrast.  Additional follow-up depends on results of upcoming chest CT scan.   15 minutes of total time was spent for this patient encounter, including preparation, face-to-face counseling with the patient and coordination of care, physical exam, and documentation of the encounter. ____________________________________  Blair Promise, PhD, MD   This document serves as a record of services personally performed by Gery Pray, MD. It was created on his behalf by Roney Mans, a trained medical scribe. The creation of this record is based on the scribe's personal observations and the provider's statements to them. This document has been checked and approved by the attending provider.

## 2020-12-22 ENCOUNTER — Encounter: Payer: Self-pay | Admitting: Radiation Oncology

## 2020-12-22 ENCOUNTER — Ambulatory Visit
Admission: RE | Admit: 2020-12-22 | Discharge: 2020-12-22 | Disposition: A | Discharge status: Home / Self Care | Payer: Medicare Other | Source: Ambulatory Visit | Attending: Radiation Oncology | Admitting: Radiation Oncology

## 2020-12-22 ENCOUNTER — Other Ambulatory Visit: Payer: Self-pay

## 2020-12-22 DIAGNOSIS — Z08 Encounter for follow-up examination after completed treatment for malignant neoplasm: Secondary | ICD-10-CM | POA: Diagnosis not present

## 2020-12-22 DIAGNOSIS — Z79899 Other long term (current) drug therapy: Secondary | ICD-10-CM | POA: Insufficient documentation

## 2020-12-22 DIAGNOSIS — Z7982 Long term (current) use of aspirin: Secondary | ICD-10-CM | POA: Insufficient documentation

## 2020-12-22 DIAGNOSIS — M5136 Other intervertebral disc degeneration, lumbar region: Secondary | ICD-10-CM | POA: Diagnosis not present

## 2020-12-22 DIAGNOSIS — R911 Solitary pulmonary nodule: Secondary | ICD-10-CM | POA: Diagnosis not present

## 2020-12-22 DIAGNOSIS — R918 Other nonspecific abnormal finding of lung field: Secondary | ICD-10-CM

## 2020-12-22 DIAGNOSIS — Z923 Personal history of irradiation: Secondary | ICD-10-CM | POA: Insufficient documentation

## 2020-12-22 DIAGNOSIS — M47816 Spondylosis without myelopathy or radiculopathy, lumbar region: Secondary | ICD-10-CM | POA: Diagnosis not present

## 2020-12-22 NOTE — Progress Notes (Addendum)
Brian [name] is here today for follow up post radiation to the lung.  Lung Side: Right, patient completed treatment on 03/16/20 Does the patient complain of any of the following: Pain:Patient  denies pain at this time, however patient reports having a sharp pain that shoots across his chest. Patient states this is the type of pain he experienced prior to lung cancer diagnosis.  Shortness of breath w/wo exertion: Yes, patient currently on oxygen 2 L via [place]. Cough: no Hemoptysis: no Pain with swallowing: no Swallowing/choking concerns: no Appetite: Patient reports having a good appetite.  Energy Level: good Post radiation skin Changes: skin remains intact.    Additional comments if applicable:   Vitals:   12/22/20 0907  BP: (!) 163/64  Pulse: 65  Resp: 20  Temp: 97.7 F (36.5 C)  SpO2: 95%  Weight: 156 lb 6.4 oz (70.9 kg)  Height: 5\' 11"  (1.803 m)  PF: (!) 2 L/min

## 2020-12-28 ENCOUNTER — Telehealth: Payer: Self-pay | Admitting: *Deleted

## 2020-12-28 DIAGNOSIS — J439 Emphysema, unspecified: Secondary | ICD-10-CM | POA: Diagnosis not present

## 2020-12-28 NOTE — Telephone Encounter (Signed)
CALLED PATIENT TO INFORM OF CT FOR 01-04-21- ARRIVAL TIME- 1:45 PM @ [facility], NO RESTRICTIONS TO TEST, LVM FOR A RETURN CALL

## 2021-01-04 ENCOUNTER — Ambulatory Visit (HOSPITAL_COMMUNITY)
Admission: RE | Admit: 2021-01-04 | Discharge: 2021-01-04 | Disposition: A | Discharge status: Home / Self Care | Payer: Medicare Other | Source: Ambulatory Visit | Attending: Radiation Oncology | Admitting: Radiation Oncology

## 2021-01-04 ENCOUNTER — Encounter: Payer: Self-pay | Admitting: Radiology

## 2021-01-04 DIAGNOSIS — R918 Other nonspecific abnormal finding of lung field: Secondary | ICD-10-CM | POA: Diagnosis not present

## 2021-01-04 DIAGNOSIS — C3490 Malignant neoplasm of unspecified part of unspecified bronchus or lung: Secondary | ICD-10-CM | POA: Diagnosis not present

## 2021-01-04 DIAGNOSIS — R911 Solitary pulmonary nodule: Secondary | ICD-10-CM | POA: Diagnosis not present

## 2021-01-04 DIAGNOSIS — J439 Emphysema, unspecified: Secondary | ICD-10-CM | POA: Diagnosis not present

## 2021-01-04 DIAGNOSIS — I700 Atherosclerosis of aorta: Secondary | ICD-10-CM | POA: Diagnosis not present

## 2021-01-06 NOTE — Progress Notes (Signed)
Radiation Oncology         [phone]) [phone] ________________________________  Name: Brian [name] MRN: [id number]  Date: 01/09/2021  DOB: 1931-04-22  Follow-Up Visit Note  CC: Binnie Rail, MD  Collene Gobble, MD    ICD-10-CM   1. Pulmonary nodules  R91.8 CT CHEST WO CONTRAST      Diagnosis: Pet positive Right upper lobe lung nodule, suspicious for primary bronchogenic carcinoma  Interval Since Last Radiation: 9 months and 26 days   Radiation Treatment Dates: 03/09/2020 through 03/16/2020   Site: Right lung Technique: SBRT Total Dose (Gy): 54/54 Dose per Fx (Gy): 18 Completed Fx: 3/3 Beam Energies: 6XFFF  Narrative:  The patient returns today for follow-up and to review recent imaging, he was last seen here for follow-up on 12/22/20. To review from out last visit, the patient voiced concern regarding his symptoms of sharp chest pain, given so, I scheduled his chest CT for one week out to assess for any potential changes.   Chest CT on 01/04/21 demonstrated the unchanged post treatment appearance of a spiculated nodule of the peripheral right upper lobe measuring 1.0 x 0.9 cm. The irregular nodule in the central left apex was seen as unchanged as well, measuring 0.7 x 0.5 cm.   He reports no further pain along his right chest at this time or left side either.  He denies any significant cough or hemoptysis.                               Allergies:  is allergic to meperidine hcl, gabapentin, meperidine, zolpidem, and zolpidem tartrate.  Meds: Current Outpatient Medications  Medication Sig Dispense Refill   ACCU-CHEK COMPACT STRIPS test strip TEST BLOOD SUGAR ONCE DAILY 100 each 1   albuterol (VENTOLIN HFA) 108 (90 Base) MCG/ACT inhaler INHALE 2 PUFFS BY MOUTH FOUR TIMES A DAY     Aspirin 81 MG EC tablet Take 81 mg by mouth daily.     cholecalciferol (VITAMIN D) 1000 UNITS tablet Take 3,000 Units by mouth daily.      enalapril (VASOTEC) 10 MG tablet TAKE 1 TABLET TWICE A  DAY 180 tablet 3   LORazepam (ATIVAN) 0.5 MG tablet Take 0.5 mg by mouth as needed for anxiety.     Magnesium Oxide 420 MG TABS Take 420 mg by mouth daily.      metoprolol succinate (TOPROL-XL) 25 MG 24 hr tablet TAKE ONE-HALF (1/2) TABLET DAILY 45 tablet 3   mirtazapine (REMERON) 15 MG tablet Take 1 tablet (15 mg total) by mouth at bedtime. 90 tablet 1   Multiple Vitamins-Minerals (MULTIVITAL PO) Take 1 tablet by mouth daily.      Polyethyl Glycol-Propyl Glycol (SYSTANE) 0.4-0.3 % SOLN Place 1 drop into both eyes daily.     simvastatin (ZOCOR) 40 MG tablet TAKE 1 TABLET DAILY 90 tablet 3   triamcinolone cream (KENALOG) 0.1 % Apply 1 application topically daily as needed (Rash).     umeclidinium-vilanterol (ANORO ELLIPTA) 62.5-25 MCG/INH AEPB USE 1 INHALATION DAILY 120 each 2   traMADol (ULTRAM) 50 MG tablet Take 1 tablet (50 mg total) by mouth every 8 (eight) hours as needed for severe pain. (Patient not taking: Reported on 12/22/2020) 15 tablet 0   No current facility-administered medications for this encounter.    Physical Findings: The patient is in no acute distress. Patient is alert and oriented.  height is 5\' 11"  (1.803 m) and weight  is 157 lb 6.4 oz (71.4 kg). His temperature is 97.9 F (36.6 C). His blood pressure is 157/62 (abnormal) and his pulse is 63. His respiration is 20 and oxygen saturation is 97%. .   Lungs are clear to auscultation bilaterally. Heart has regular rate and rhythm. No palpable cervical, supraclavicular, or axillary adenopathy. Abdomen soft, non-tender, normal bowel sounds.   Lab Findings: Lab Results  Component Value Date   WBC 4.7 10/19/2020   HGB 12.6 (L) 10/19/2020   HCT 38.2 (L) 10/19/2020   MCV 93.1 10/19/2020   PLT 153.0 10/19/2020    Radiographic Findings: CT CHEST WO CONTRAST  Result Date: 01/05/2021 CLINICAL DATA:  Non-small cell lung cancer of the right upper lobe, status post radiation therapy EXAM: CT CHEST WITHOUT CONTRAST TECHNIQUE:  Multidetector CT imaging of the chest was performed following the standard protocol without IV contrast. COMPARISON:  07/21/2020 FINDINGS: Cardiovascular: Aortic atherosclerosis. Normal heart size. Extensive 3 vessel coronary artery calcifications. Enlargement of the main pulmonary artery measuring up to 3.9 cm. No pericardial effusion. Mediastinum/Nodes: No enlarged mediastinal, hilar, or axillary lymph nodes. Thyroid gland, trachea, and esophagus demonstrate no significant findings. Lungs/Pleura: Severe emphysema. Unchanged spiculated nodule of the peripheral right upper lobe measuring 1.0 x 0.9 cm (series 7, image 66). Unchanged irregular nodule of the central left apex measuring 0.7 x 0.5 cm, previously not FDG avid (series 7, image 36). No pleural effusion or pneumothorax. Upper Abdomen: No acute abnormality. Musculoskeletal: No chest wall mass or suspicious bone lesions identified. IMPRESSION: 1. Unchanged post treatment appearance of a spiculated nodule of the peripheral right upper lobe measuring 1.0 x 0.9 cm. 2. Unchanged irregular nodule of the central left apex measuring 0.7 x 0.5 cm, previously not FDG avid, likely incidental and benign. Continued attention on follow-up. 3. Severe emphysema. 4. Coronary artery disease. 5. Enlargement of the main pulmonary artery, as can be seen in pulmonary hypertension. Aortic Atherosclerosis (ICD10-I70.0) and Emphysema (ICD10-J43.9). Electronically Signed   By: Delanna Ahmadi M.D.   On: 01/05/2021 14:47    Impression:  Pet positive Right upper lobe lung nodule, suspicious for primary bronchogenic carcinoma  The patient completed SBRT for his PET positive lesion.  Recent chest CT scan is encouraging and with no new problems either.  Plan: Patient will undergo a chest CT scan in approximately 6 months.  Routine follow-up after the scan.   15 minutes of total time was spent for this patient encounter, including preparation, face-to-face counseling with the patient  and coordination of care, physical exam, and documentation of the encounter. ____________________________________  Blair Promise, PhD, MD   This document serves as a record of services personally performed by Gery Pray, MD. It was created on his behalf by Roney Mans, a trained medical scribe. The creation of this record is based on the scribe's personal observations and the provider's statements to them. This document has been checked and approved by the attending provider.

## 2021-01-09 ENCOUNTER — Other Ambulatory Visit: Payer: Self-pay

## 2021-01-09 ENCOUNTER — Encounter: Payer: Self-pay | Admitting: Radiation Oncology

## 2021-01-09 ENCOUNTER — Ambulatory Visit
Admission: RE | Admit: 2021-01-09 | Discharge: 2021-01-09 | Disposition: A | Discharge status: Home / Self Care | Payer: Medicare Other | Source: Ambulatory Visit | Attending: Radiation Oncology | Admitting: Radiation Oncology

## 2021-01-09 DIAGNOSIS — Z7982 Long term (current) use of aspirin: Secondary | ICD-10-CM | POA: Diagnosis not present

## 2021-01-09 DIAGNOSIS — Z79899 Other long term (current) drug therapy: Secondary | ICD-10-CM | POA: Diagnosis not present

## 2021-01-09 DIAGNOSIS — Z923 Personal history of irradiation: Secondary | ICD-10-CM | POA: Insufficient documentation

## 2021-01-09 DIAGNOSIS — I2510 Atherosclerotic heart disease of native coronary artery without angina pectoris: Secondary | ICD-10-CM | POA: Diagnosis not present

## 2021-01-09 DIAGNOSIS — R918 Other nonspecific abnormal finding of lung field: Secondary | ICD-10-CM

## 2021-01-09 DIAGNOSIS — I700 Atherosclerosis of aorta: Secondary | ICD-10-CM | POA: Insufficient documentation

## 2021-01-09 DIAGNOSIS — J439 Emphysema, unspecified: Secondary | ICD-10-CM | POA: Insufficient documentation

## 2021-01-09 DIAGNOSIS — R911 Solitary pulmonary nodule: Secondary | ICD-10-CM | POA: Insufficient documentation

## 2021-01-09 DIAGNOSIS — Z08 Encounter for follow-up examination after completed treatment for malignant neoplasm: Secondary | ICD-10-CM | POA: Diagnosis not present

## 2021-01-09 NOTE — Progress Notes (Signed)
Brian [name] is here today for follow up post radiation to the lung.  Lung Side: Right, patient completed treatment on 03/16/20.  Does the patient complain of any of the following: Pain:Patient denies pain. Shortness of breath w/wo exertion:  yes, patient reports having shortness of breath on exertion.  Patient currently on 2 L via [place]. Cough: no Hemoptysis: no Pain with swallowing: no Swallowing/choking concerns: no Appetite: good Energy Level: good Post radiation skin Changes: no   Additional comments if applicable:   Vitals:   01/09/21 1040  BP: (!) 157/62  Pulse: 63  Resp: 20  Temp: 97.9 F (36.6 C)  SpO2: 97%  Weight: 157 lb 6.4 oz (71.4 kg)  Height: 5\' 11"  (1.803 m)

## 2021-01-26 ENCOUNTER — Ambulatory Visit: Payer: Self-pay | Admitting: Radiation Oncology

## 2021-01-27 DIAGNOSIS — J439 Emphysema, unspecified: Secondary | ICD-10-CM | POA: Diagnosis not present

## 2021-02-08 ENCOUNTER — Encounter: Payer: Self-pay | Admitting: Podiatry

## 2021-02-08 ENCOUNTER — Ambulatory Visit (INDEPENDENT_AMBULATORY_CARE_PROVIDER_SITE_OTHER): Payer: Medicare Other | Admitting: Podiatry

## 2021-02-08 ENCOUNTER — Other Ambulatory Visit: Payer: Self-pay

## 2021-02-08 DIAGNOSIS — M79674 Pain in right toe(s): Secondary | ICD-10-CM

## 2021-02-08 DIAGNOSIS — M79675 Pain in left toe(s): Secondary | ICD-10-CM | POA: Diagnosis not present

## 2021-02-08 DIAGNOSIS — N183 Chronic kidney disease, stage 3 unspecified: Secondary | ICD-10-CM

## 2021-02-08 DIAGNOSIS — E1159 Type 2 diabetes mellitus with other circulatory complications: Secondary | ICD-10-CM

## 2021-02-08 DIAGNOSIS — I739 Peripheral vascular disease, unspecified: Secondary | ICD-10-CM

## 2021-02-08 DIAGNOSIS — B351 Tinea unguium: Secondary | ICD-10-CM

## 2021-02-08 NOTE — Progress Notes (Signed)
This patient returns to my office for at risk foot care.  This patient requires this care by a professional since this patient will be at risk due to having chronic kidney disease and diabetes.  This patient is unable to cut nails himself since the patient cannot reach his nails.These nails are painful walking and wearing shoes.  This patient presents for at risk foot care today.  General Appearance  Alert, conversant and in no acute stress.  Vascular  Dorsalis pedis and posterior tibial  pulses are absent   bilaterally.  Capillary return is within normal limits  bilaterally. Temperature is within normal limits  Bilaterally. Venous stasis  Left leg/foot.  Neurologic  Senn-Weinstein monofilament wire test within normal limits  bilaterally. Muscle power within normal limits bilaterally.  Nails Thick disfigured discolored nails with subungual debris  from hallux to fifth toes bilaterally. No evidence of bacterial infection or drainage bilaterally.  Orthopedic  No limitations of motion  feet .  No crepitus or effusions noted.  No bony pathology or digital deformities noted.  HAV  B/L.  Skin  normotropic skin with no porokeratosis noted bilaterally.  No signs of infections or ulcers noted.     Onychomycosis  Pain in right toes  Pain in left toes  Consent was obtained for treatment procedures.   Mechanical debridement of nails 1-5  bilaterally performed with a nail nipper.  Filed with dremel without incident.    Return office visit  3 months                    Told patient to return for periodic foot care and evaluation due to potential at risk complications.   Gardiner Barefoot DPM

## 2021-02-25 ENCOUNTER — Other Ambulatory Visit: Payer: Self-pay

## 2021-02-25 DIAGNOSIS — I714 Abdominal aortic aneurysm, without rupture, unspecified: Secondary | ICD-10-CM

## 2021-02-27 DIAGNOSIS — J439 Emphysema, unspecified: Secondary | ICD-10-CM | POA: Diagnosis not present

## 2021-03-13 ENCOUNTER — Other Ambulatory Visit: Payer: Self-pay

## 2021-03-13 ENCOUNTER — Ambulatory Visit (INDEPENDENT_AMBULATORY_CARE_PROVIDER_SITE_OTHER): Payer: Medicare Other | Admitting: Physician Assistant

## 2021-03-13 ENCOUNTER — Ambulatory Visit (HOSPITAL_COMMUNITY)
Admission: RE | Admit: 2021-03-13 | Discharge: 2021-03-13 | Disposition: A | Discharge status: Home / Self Care | Payer: Medicare Other | Source: Ambulatory Visit | Attending: Surgery | Admitting: Surgery

## 2021-03-13 VITALS — BP 158/78 | HR 54 | Resp 24 | Ht 71.0 in | Wt 153.9 lb

## 2021-03-13 DIAGNOSIS — I714 Abdominal aortic aneurysm, without rupture, unspecified: Secondary | ICD-10-CM | POA: Diagnosis not present

## 2021-03-13 DIAGNOSIS — H40021 Open angle with borderline findings, high risk, right eye: Secondary | ICD-10-CM | POA: Insufficient documentation

## 2021-03-13 NOTE — Progress Notes (Signed)
Office Note     CC:  follow up Requesting Provider:  Binnie Rail, MD  HPI: Brian [name] is a 88 y.o. ([birth year]) male who presents for surveillance of endovascular repair of abdominal aortic aneurysm.  5.5 cm asymptomatic AAA was repaired by Dr. Donzetta Matters in December 2020.  He denies any new or changing abdominal or back pain.  When he was seen last year he was just diagnosed with lung cancer and began radiation.  He now requires oxygen 24/7.  He sees his oncologist regularly and reports that his lung cancer is stable.  He is on aspirin and statin daily.  He denies any claudication, rest pain, or nonhealing wounds of bilateral lower extremities.   Past Medical History:  Diagnosis Date   AAA (abdominal aortic aneurysm)    Arthritis    Asthma 1976   Bronchiolitis 06/2014   CAD (coronary artery disease)    NSTEMI 02/2010;  s/p CABG 1/12 (L-LAD, SOM1, S-PDA); echo in 08/2010: Mild LVH, EF 68%, grade 2 diastolic dysfunction, mild MR, mild LAE, mildly reduced RV function, mild RAE, PASP 42   Carpal tunnel syndrome of left wrist    Colon polyps    COPD (chronic obstructive pulmonary disease) (HCC)    Diabetes mellitus 2000   Type II, controlls with diet   Dyspnea    ED (erectile dysfunction)    Gout    History of radiation therapy 03/09/2020-03/16/2020   SBRT right lung: Dr. Gery Pray   HTN (hypertension)    Hyperlipidemia    Inguinal hernia    Right   Macular degeneration    Myocardial infarction [facility]) 1 -13-2012   Neuropathy     Past Surgical History:  Procedure Laterality Date   3 vessel CABG  03/03/2010   ABDOMINAL AORTIC ENDOVASCULAR STENT GRAFT N/A 01/21/2019   Procedure: ABDOMINAL AORTIC ENDOVASCULAR STENT GRAFT;  Surgeon: Waynetta Sandy, MD;  Location: [place];  Service: Vascular;  Laterality: N/A;   cataract left eye     COLONOSCOPY     neg 2009   COLONOSCOPY  2014   5 mm sessile polyp; AVM   CORONARY ARTERY BYPASS GRAFT  2012   X3   ENDOVASCULAR  REPAIR/STENT GRAFT  01/21/2019   ABDOMINAL AORTIC ENDOVASCULAR STENT GRAFT (N/A )   EYE SURGERY     Left cataract   HERNIA REPAIR     INCISION AND DRAINAGE PERIRECTAL ABSCESS     INGUINAL HERNIA REPAIR      Social History   Socioeconomic History   Marital status: Widowed    Spouse name: Not on file   Number of children: 3   Years of education: 11   Highest education level: Not on file  Occupational History   Occupation: RETIRED    Employer: RETIRED  Tobacco Use   Smoking status: Former    Packs/day: 1.00    Years: 40.00    Pack years: 40.00    Types: Cigarettes    Quit date: 02/20/2003    Years since quitting: 18.0   Smokeless tobacco: Never  Vaping Use   Vaping Use: Never used  Substance and Sexual Activity   Alcohol use: Not Currently    Alcohol/week: 6.0 standard drinks    Types: 3 Glasses of wine, 3 Shots of liquor per week    Comment:  1-3 drinks a week   Drug use: No   Sexual activity: Not on file  Other Topics Concern   Not on file  Social History  Narrative   Quit smoking in 1998. 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    One story home   Right handed   3 children   Social Determinants of Health   Financial Resource Strain: Not on file  Food Insecurity: Not on file  Transportation Needs: Not on file  Physical Activity: Not on file  Stress: Not on file  Social Connections: Not on file  Intimate Partner Violence: Not on file    Family History  Problem Relation Age of Onset   Heart failure Mother    Heart disease Mother        CHF   Heart attack Mother    Hypertension Mother    Heart attack Father    Heart disease Father 59       MI   Hypertension Father    Heart attack Sister 22   Heart disease Sister 66       MI   Diabetes Sister    Hyperlipidemia Sister    Hypertension Sister    Varicose Veins Sister    Heart attack Brother 48   Diabetes Brother    Heart disease Brother 81       MI   Cancer Brother    Hypertension Brother    Deep  vein thrombosis Daughter    Leukemia Daughter    Other Son        varicose veins   Arthritis/Rheumatoid Son    Lupus Daughter    Colon cancer Neg Hx     Current Outpatient Medications  Medication Sig Dispense Refill   ACCU-CHEK COMPACT STRIPS test strip TEST BLOOD SUGAR ONCE DAILY 100 each 1   albuterol (VENTOLIN HFA) 108 (90 Base) MCG/ACT inhaler INHALE 2 PUFFS BY MOUTH FOUR TIMES A DAY     Aspirin 81 MG EC tablet Take 81 mg by mouth daily.     cholecalciferol (VITAMIN D) 1000 UNITS tablet Take 3,000 Units by mouth daily.      enalapril (VASOTEC) 10 MG tablet TAKE 1 TABLET TWICE A DAY 180 tablet 3   LORazepam (ATIVAN) 0.5 MG tablet Take 0.5 mg by mouth as needed for anxiety.     Magnesium Oxide 420 MG TABS Take 420 mg by mouth daily.      metoprolol succinate (TOPROL-XL) 25 MG 24 hr tablet TAKE ONE-HALF (1/2) TABLET DAILY 45 tablet 3   mirtazapine (REMERON) 15 MG tablet Take 1 tablet (15 mg total) by mouth at bedtime. 90 tablet 1   Multiple Vitamins-Minerals (MULTIVITAL PO) Take 1 tablet by mouth daily.      Polyethyl Glycol-Propyl Glycol (SYSTANE) 0.4-0.3 % SOLN Place 1 drop into both eyes daily.     simvastatin (ZOCOR) 40 MG tablet TAKE 1 TABLET DAILY 90 tablet 3   triamcinolone cream (KENALOG) 0.1 % Apply 1 application topically daily as needed (Rash).     umeclidinium-vilanterol (ANORO ELLIPTA) 62.5-25 MCG/INH AEPB USE 1 INHALATION DAILY 120 each 2   traMADol (ULTRAM) 50 MG tablet Take 1 tablet (50 mg total) by mouth every 8 (eight) hours as needed for severe pain. (Patient not taking: Reported on 03/13/2021) 15 tablet 0   No current facility-administered medications for this visit.    Allergies  Allergen Reactions   Meperidine Hcl     Tongue swelling Because of a history of documented adverse serious drug reaction;Medi Alert bracelet  is recommended   Gabapentin     confusion   Meperidine     Other reaction(s): Angioedema of tongue  Zolpidem     Other reaction(s):  Nightmares   Zolpidem Tartrate Other (See Comments)    Nightmares     REVIEW OF SYSTEMS:   [X]  denotes positive finding, [ ]  denotes negative finding Cardiac  Comments:  Chest pain or chest pressure:    Shortness of breath upon exertion:    Short of breath when lying flat:    Irregular heart rhythm:        Vascular    Pain in calf, thigh, or hip brought on by ambulation:    Pain in feet at night that wakes you up from your sleep:     Blood clot in your veins:    Leg swelling:         Pulmonary    Oxygen at home:    Productive cough:     Wheezing:         Neurologic    Sudden weakness in arms or legs:     Sudden numbness in arms or legs:     Sudden onset of difficulty speaking or slurred speech:    Temporary loss of vision in one eye:     Problems with dizziness:         Gastrointestinal    Blood in stool:     Vomited blood:         Genitourinary    Burning when urinating:     Blood in urine:        Psychiatric    Major depression:         Hematologic    Bleeding problems:    Problems with blood clotting too easily:        Skin    Rashes or ulcers:        Constitutional    Fever or chills:      PHYSICAL EXAMINATION:  Vitals:   03/13/21 0943  BP: (!) 158/78  Pulse: (!) 54  Resp: (!) 24  SpO2: 98%  Weight: 153 lb 14.4 oz (69.8 kg)  Height: 5\' 11"  (1.803 m)  PF: (!) 2 L/min    General:  WDWN in NAD; vital signs documented above Gait: Not observed HENT: WNL, normocephalic Pulmonary: normal non-labored breathing , without Rales, rhonchi,  wheezing Cardiac: regular HR Abdomen: soft, NT, no masses Skin: without rashes Vascular Exam/Pulses:  Right Left  Radial 2+ (normal) 2+ (normal)  DP absent absent  PT absent absent   Extremities: without ischemic changes, without Gangrene , without cellulitis; without open wounds;  Musculoskeletal: no muscle wasting or atrophy  Neurologic: A&O X 3;  No focal weakness or paresthesias are  detected Psychiatric:  The pt has Normal affect.   Non-Invasive Vascular Imaging:   AAA sac 3.9 cm in largest diameter 282 cm/s left external iliac artery    ASSESSMENT/PLAN:: 88 y.o. male here for follow up for surveillance of EVAR  -Despite incidental finding of elevated left external iliac artery velocity, patient denies any claudication, rest pain, or nonhealing wounds.  No indication for revascularization -AAA sac stable after EVAR; repeat EVAR duplex in 1 year -Continue aspirin and statin daily -Call/return office sooner with any questions or concerns   Dagoberto Ligas, PA-C [facility] [phone]  Clinic MD:   Trula Slade

## 2021-03-30 DIAGNOSIS — J439 Emphysema, unspecified: Secondary | ICD-10-CM | POA: Diagnosis not present

## 2021-04-17 ENCOUNTER — Encounter: Payer: Self-pay | Admitting: Internal Medicine

## 2021-04-17 NOTE — Patient Instructions (Addendum)
° ° ° °  Blood work was ordered.      Medications changes include :  start sertraline 50 mg daily.  Stop the mirtazapine.      Your prescription(s) have been sent to your pharmacy.      Return in about 6 weeks (around 05/30/2021) for follow up.

## 2021-04-17 NOTE — Progress Notes (Signed)
Subjective:    Patient ID: Brian [name], male    DOB: April 24, 1931, 88 y.o.   MRN: [id number]  This visit occurred during the SARS-CoV-2 public health emergency.  Safety protocols were in place, including screening questions prior to the visit, additional usage of staff PPE, and extensive cleaning of exam room while observing appropriate contact time as indicated for disinfecting solutions.     HPI The patient is here for follow up of their chronic medical problems, including DM, htn, hld, CKD, anxiety, sleep difficulties COPD, h/o presumed lung ca s/p radiation.  He is here today with a friend.   Panic attacks?  Does not occur often.  Shower does not want to be closed in.  Taking medication scares him.  He is afraid of choking.  He worries a lot.  He gets hot and get confused for a minute.  He does feel SOB.  He denies chest pain.  Both ears also get blocked- he can pop open is right ear.  Occurs randomly.    His daughter died a year and a half ago.    BP slightly elevated - upon repeat after 5 minutes it improves.    No issues with swallowing food or drinks.  He stated his appetite is good.  He does not is eat as much as he used to, but feels he is eating okay.  Some memory concerns    Medications and allergies reviewed with patient and updated if appropriate.  Patient Active Problem List   Diagnosis Date Noted   Open angle with borderline findings, high risk, right eye 03/13/2021   Acute right-sided low back pain with right-sided sciatica 11/02/2020   Asthma 04/27/2020   Asymptomatic varicose veins 04/27/2020   Chronic ischemic heart disease 04/27/2020   Diabetes mellitus type 2 without retinopathy ([place]) 04/27/2020   Gout 04/27/2020   Hypertrophy of prostate without urinary obstruction and other lower urinary tract symptoms (LUTS) 04/27/2020   Osteoarthrosis 04/27/2020   Refractive error 04/27/2020   Aortic atherosclerosis ([place]) 04/20/2020   Trigger finger of right  thumb 10/15/2019   Anxiety 10/15/2019   Pain due to onychomycosis of toenails of both feet 07/17/2019   Left upper arm pain 05/11/2019   Pulmonary nodules 04/27/2019   Nerve pain 10/15/2018   Low vitamin B12 level 09/30/2018   LUQ pain 09/30/2018   Arthritis of carpometacarpal (CMC) joint of thumb 09/30/2018   Chest pain 01/29/2018   Decreased hearing of right ear 11/23/2017   Vertigo 07/17/2016   Nocturnal leg cramps 09/20/2015   COPD (chronic obstructive pulmonary disease) ([place]) 05/31/2015   CKD (chronic kidney disease) stage 3, GFR 30-59 ml/min (HCC) 05/31/2015   Diabetes ([place]) 04/26/2015   AAA (abdominal aortic aneurysm) without rupture s/p AAA repair 01/18/2015   PAD (peripheral artery disease) ([place]) 01/18/2015   Hereditary and idiopathic peripheral neuropathy 10/29/2012   Hypertension 05/10/2011   CAD (coronary artery disease) 09/05/2010   TINNITUS 04/04/2010   CORONARY ARTERY BYPASS GRAFT, HX OF 03/31/2010   Disturbance in sleep behavior 12/28/2008   Cervicalgia 07/26/2008   POLYP, COLON 07/16/2007   Hyperlipidemia 09/24/2006   History of gout 09/23/2006    Current Outpatient Medications on File Prior to Visit  Medication Sig Dispense Refill   ACCU-CHEK COMPACT STRIPS test strip TEST BLOOD SUGAR ONCE DAILY 100 each 1   albuterol (VENTOLIN HFA) 108 (90 Base) MCG/ACT inhaler INHALE 2 PUFFS BY MOUTH FOUR TIMES A DAY     Aspirin 81  MG EC tablet Take 81 mg by mouth daily.     cholecalciferol (VITAMIN D) 1000 UNITS tablet Take 3,000 Units by mouth daily.      enalapril (VASOTEC) 10 MG tablet TAKE 1 TABLET TWICE A DAY 180 tablet 3   Magnesium Oxide 420 MG TABS Take 420 mg by mouth daily.      Multiple Vitamins-Minerals (MULTIVITAL PO) Take 1 tablet by mouth daily.      Polyethyl Glycol-Propyl Glycol (SYSTANE) 0.4-0.3 % SOLN Place 1 drop into both eyes daily.     triamcinolone cream (KENALOG) 0.1 % Apply 1 application topically daily as needed (Rash).     No current  facility-administered medications on file prior to visit.    Past Medical History:  Diagnosis Date   AAA (abdominal aortic aneurysm)    Arthritis    Asthma 1976   Bronchiolitis 06/2014   CAD (coronary artery disease)    NSTEMI 02/2010;  s/p CABG 1/12 (L-LAD, SOM1, S-PDA); echo in 08/2010: Mild LVH, EF 97%, grade 2 diastolic dysfunction, mild MR, mild LAE, mildly reduced RV function, mild RAE, PASP 42   Carpal tunnel syndrome of left wrist    Colon polyps    COPD (chronic obstructive pulmonary disease) (HCC)    Diabetes mellitus 2000   Type II, controlls with diet   Dyspnea    ED (erectile dysfunction)    Gout    History of radiation therapy 03/09/2020-03/16/2020   SBRT right lung: Dr. Gery Pray   HTN (hypertension)    Hyperlipidemia    Inguinal hernia    Right   Macular degeneration    Myocardial infarction [facility]) 1 -13-2012   Neuropathy     Past Surgical History:  Procedure Laterality Date   3 vessel CABG  03/03/2010   ABDOMINAL AORTIC ENDOVASCULAR STENT GRAFT N/A 01/21/2019   Procedure: ABDOMINAL AORTIC ENDOVASCULAR STENT GRAFT;  Surgeon: Waynetta Sandy, MD;  Location: [facility];  Service: Vascular;  Laterality: N/A;   cataract left eye     COLONOSCOPY     neg 2009   COLONOSCOPY  2014   5 mm sessile polyp; AVM   CORONARY ARTERY BYPASS GRAFT  2012   X3   ENDOVASCULAR REPAIR/STENT GRAFT  01/21/2019   ABDOMINAL AORTIC ENDOVASCULAR STENT GRAFT (N/A )   EYE SURGERY     Left cataract   HERNIA REPAIR     INCISION AND DRAINAGE PERIRECTAL ABSCESS     INGUINAL HERNIA REPAIR      Social History   Socioeconomic History   Marital status: Widowed    Spouse name: Not on file   Number of children: 3   Years of education: 72   Highest education level: Not on file  Occupational History   Occupation: RETIRED    Employer: RETIRED  Tobacco Use   Smoking status: Former    Packs/day: 1.00    Years: 40.00    Pack years: 40.00    Types: Cigarettes    Quit date:  02/20/2003    Years since quitting: 18.1   Smokeless tobacco: Never  Vaping Use   Vaping Use: Never used  Substance and Sexual Activity   Alcohol use: Not Currently    Alcohol/week: 6.0 standard drinks    Types: 3 Glasses of wine, 3 Shots of liquor per week    Comment:  1-3 drinks a week   Drug use: No   Sexual activity: Not on file  Other Topics Concern   Not on file  Social  History Narrative   Quit smoking in 1998. 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    One story home   Right handed   3 children   Social Determinants of Health   Financial Resource Strain: Not on file  Food Insecurity: Not on file  Transportation Needs: Not on file  Physical Activity: Not on file  Stress: Not on file  Social Connections: Not on file    Family History  Problem Relation Age of Onset   Heart failure Mother    Heart disease Mother        CHF   Heart attack Mother    Hypertension Mother    Heart attack Father    Heart disease Father 80       MI   Hypertension Father    Heart attack Sister 72   Heart disease Sister 22       MI   Diabetes Sister    Hyperlipidemia Sister    Hypertension Sister    Varicose Veins Sister    Heart attack Brother 11   Diabetes Brother    Heart disease Brother 84       MI   Cancer Brother    Hypertension Brother    Deep vein thrombosis Daughter    Leukemia Daughter    Other Son        varicose veins   Arthritis/Rheumatoid Son    Lupus Daughter    Colon cancer Neg Hx     Review of Systems  Constitutional:  Negative for fever.  Respiratory:  Positive for shortness of breath (only w/o oxygen). Negative for cough and wheezing.   Cardiovascular:  Negative for chest pain, palpitations and leg swelling.  Neurological:  Negative for light-headedness and headaches.      Objective:   Vitals:   04/18/21 1054 04/18/21 1149  BP: (!) 156/78 (!) 178/62  Pulse: 67   Temp: 98 F (36.7 C)   SpO2: 94%    BP Readings from Last 3 Encounters:   04/18/21 (!) 178/62  03/13/21 (!) 158/78  01/09/21 (!) 157/62   Wt Readings from Last 3 Encounters:  04/18/21 157 lb 2 oz (71.3 kg)  03/13/21 153 lb 14.4 oz (69.8 kg)  01/09/21 157 lb 6.4 oz (71.4 kg)   Body mass index is 21.91 kg/m.   Physical Exam Constitutional:      General: He is not in acute distress.    Appearance: He is not ill-appearing.     Comments: Chronically ill-appearing on oxygen via nasal cannula  HENT:     Head: Normocephalic and atraumatic.  Eyes:     Conjunctiva/sclera: Conjunctivae normal.  Cardiovascular:     Rate and Rhythm: Normal rate and regular rhythm.  Pulmonary:     Effort: Pulmonary effort is normal. No respiratory distress.     Breath sounds: No wheezing or rales.     Comments: Decreased breath sounds diffusely Musculoskeletal:     Cervical back: Neck supple. No tenderness.     Right lower leg: No edema.     Left lower leg: No edema.  Lymphadenopathy:     Cervical: No cervical adenopathy.  Skin:    General: Skin is warm and dry.  Neurological:     General: No focal deficit present.     Mental Status: He is alert and oriented to person, place, and time.  Psychiatric:        Mood and Affect: Mood normal.        Behavior: Behavior  normal.        Thought Content: Thought content normal.        Judgment: Judgment normal.         Lab Results  Component Value Date   WBC 4.7 10/19/2020   HGB 12.6 (L) 10/19/2020   HCT 38.2 (L) 10/19/2020   PLT 153.0 10/19/2020   GLUCOSE 95 10/19/2020   CHOL 131 04/20/2020   TRIG 88.0 04/20/2020   HDL 47.20 04/20/2020   LDLCALC 66 04/20/2020   ALT 10 10/19/2020   AST 15 10/19/2020   NA 140 10/19/2020   K 4.1 10/19/2020   CL 104 10/19/2020   CREATININE 1.51 (H) 10/19/2020   BUN 26 (H) 10/19/2020   CO2 27 10/19/2020   TSH 1.88 09/26/2016   PSA 1.42 07/16/2007   INR 1.2 01/21/2019   HGBA1C 6.3 10/19/2020   MICROALBUR 9.45 03/19/2018     VAS [place] EVAR DUPLEX Endovascular Aortic Repair Study  (EVAR)  Patient Name:  Brian [name]  Date of Exam:   03/13/2021 Medical Rec #: [id number]        Accession #:    [id number] Date of Birth: 1931-07-07       Patient Gender: M Patient Age:   79 years Exam Location:  [facility] Procedure:      VAS [place] EVAR DUPLEX Referring Phys: Harold Barban  --------------------------------------------------------------------------------   Indications: Follow up exam for EVAR. Surgery date 01/21/19.  Risk Factors: Coronary artery disease.    Performing Technologist: Ralene Cork RVT    Examination Guidelines: A complete evaluation includes B-mode imaging, spectral Doppler, color Doppler, and power Doppler as needed of all accessible portions of each vessel. Bilateral testing is considered an integral part of a complete examination. Limited examinations for reoccurring indications may be performed as noted.    Abdominal Aorta Findings: +-------------+-------+----------+----------+--------+--------+--------+  Location      AP (cm) Trans (cm) PSV (cm/s) Waveform Thrombus Comments  +-------------+-------+----------+----------+--------+--------+--------+  LT EIA Distal                    282                                    +-------------+-------+----------+----------+--------+--------+--------+  Endovascular Aortic Repair (EVAR): +----------+----------------+-------------------+-------------------+             Diameter AP (cm) Diameter Trans (cm) Velocities (cm/sec)  +----------+----------------+-------------------+-------------------+  Aorta      3.61             3.88                39                   +----------+----------------+-------------------+-------------------+  Right Limb 1.54             1.41                63                   +----------+----------------+-------------------+-------------------+  Left Limb  1.52             1.92                64                    +----------+----------------+-------------------+-------------------+     Summary: Abdominal Aorta: Patent endovascular aneurysm repair with no evidence  of endoleak. Previous diameter measurement was 3.4 x 3.8cm obtained on 03/10/20. Stenosis: +-------------------+-------------+  Location            Stenosis       +-------------------+-------------+  Left External Iliac >50% stenosis  +-------------------+-------------+       *See table(s) above for measurements and observations.   Electronically signed by Harold Barban MD on 03/13/2021 at 7:56:06 PM.       Final     Assessment & Plan:    See Problem List for Assessment and Plan of chronic medical problems.

## 2021-04-18 ENCOUNTER — Other Ambulatory Visit: Payer: Self-pay

## 2021-04-18 ENCOUNTER — Ambulatory Visit (INDEPENDENT_AMBULATORY_CARE_PROVIDER_SITE_OTHER): Payer: Medicare Other | Admitting: Internal Medicine

## 2021-04-18 ENCOUNTER — Other Ambulatory Visit: Payer: Self-pay | Admitting: Internal Medicine

## 2021-04-18 VITALS — BP 178/62 | HR 67 | Temp 98.0°F | Ht 71.0 in | Wt 157.1 lb

## 2021-04-18 DIAGNOSIS — G479 Sleep disorder, unspecified: Secondary | ICD-10-CM | POA: Diagnosis not present

## 2021-04-18 DIAGNOSIS — E1122 Type 2 diabetes mellitus with diabetic chronic kidney disease: Secondary | ICD-10-CM | POA: Diagnosis not present

## 2021-04-18 DIAGNOSIS — F419 Anxiety disorder, unspecified: Secondary | ICD-10-CM

## 2021-04-18 DIAGNOSIS — I10 Essential (primary) hypertension: Secondary | ICD-10-CM | POA: Diagnosis not present

## 2021-04-18 DIAGNOSIS — N182 Chronic kidney disease, stage 2 (mild): Secondary | ICD-10-CM

## 2021-04-18 DIAGNOSIS — E538 Deficiency of other specified B group vitamins: Secondary | ICD-10-CM | POA: Diagnosis not present

## 2021-04-18 DIAGNOSIS — E782 Mixed hyperlipidemia: Secondary | ICD-10-CM | POA: Diagnosis not present

## 2021-04-18 DIAGNOSIS — J439 Emphysema, unspecified: Secondary | ICD-10-CM

## 2021-04-18 DIAGNOSIS — N1832 Chronic kidney disease, stage 3b: Secondary | ICD-10-CM

## 2021-04-18 LAB — COMPREHENSIVE METABOLIC PANEL
ALT: 9 U/L (ref 0–53)
AST: 15 U/L (ref 0–37)
Albumin: 4.4 g/dL (ref 3.5–5.2)
Alkaline Phosphatase: 66 U/L (ref 39–117)
BUN: 26 mg/dL — ABNORMAL HIGH (ref 6–23)
CO2: 27 mEq/L (ref 19–32)
Calcium: 9.5 mg/dL (ref 8.4–10.5)
Chloride: 105 mEq/L (ref 96–112)
Creatinine, Ser: 1.44 mg/dL (ref 0.40–1.50)
GFR: 43.78 mL/min — ABNORMAL LOW (ref >60.00)
Glucose, Bld: 92 mg/dL (ref 70–99)
Potassium: 4.6 mEq/L (ref 3.5–5.1)
Sodium: 140 mEq/L (ref 135–145)
Total Bilirubin: 0.6 mg/dL (ref 0.2–1.2)
Total Protein: 7.5 g/dL (ref 6.0–8.3)

## 2021-04-18 LAB — LIPID PANEL
Cholesterol: 141 mg/dL (ref 0–200)
HDL: 53.9 mg/dL (ref >39.00)
LDL Cholesterol: 71 mg/dL (ref 0–99)
NonHDL: 87
Total CHOL/HDL Ratio: 3
Triglycerides: 80 mg/dL (ref 0.0–149.0)
VLDL: 16 mg/dL (ref 0.0–40.0)

## 2021-04-18 LAB — CBC WITH DIFFERENTIAL/PLATELET
Basophils Absolute: 0.1 10*3/uL (ref 0.0–0.1)
Basophils Relative: 1.7 % (ref 0.0–3.0)
Eosinophils Absolute: 0.2 10*3/uL (ref 0.0–0.7)
Eosinophils Relative: 4.8 % (ref 0.0–5.0)
HCT: 36.2 % — ABNORMAL LOW (ref 39.0–52.0)
Hemoglobin: 11.7 g/dL — ABNORMAL LOW (ref 13.0–17.0)
Lymphocytes Relative: 27.5 % (ref 12.0–46.0)
Lymphs Abs: 1.3 10*3/uL (ref 0.7–4.0)
MCHC: 32.5 g/dL (ref 30.0–36.0)
MCV: 91.3 fl (ref 78.0–100.0)
Monocytes Absolute: 0.4 10*3/uL (ref 0.1–1.0)
Monocytes Relative: 9.7 % (ref 3.0–12.0)
Neutro Abs: 2.6 10*3/uL (ref 1.4–7.7)
Neutrophils Relative %: 56.3 % (ref 43.0–77.0)
Platelets: 145 10*3/uL — ABNORMAL LOW (ref 150.0–400.0)
RBC: 3.96 Mil/uL — ABNORMAL LOW (ref 4.22–5.81)
RDW: 14.2 % (ref 11.5–15.5)
WBC: 4.6 10*3/uL (ref 4.0–10.5)

## 2021-04-18 LAB — HEMOGLOBIN A1C: Hgb A1c MFr Bld: 6.5 % (ref 4.6–6.5)

## 2021-04-18 LAB — VITAMIN B12: Vitamin B-12: 305 pg/mL (ref 211–911)

## 2021-04-18 MED ORDER — SERTRALINE HCL 50 MG PO TABS: 50.0000 mg | ORAL_TABLET | Freq: Every day | ORAL | 5 refills | Status: DC

## 2021-04-18 NOTE — Assessment & Plan Note (Signed)
Chronic Was on Restoril for a while in the past which seemed to work Was on mirtazapine for a while that helped with sleep, but still was having anxiety Will stop mirtazapine for now Start sertraline 50 mg nightly Follow-up in 6 weeks

## 2021-04-18 NOTE — Assessment & Plan Note (Signed)
Chronic CMP 

## 2021-04-18 NOTE — Assessment & Plan Note (Signed)
Chronic ?Check B12 level ?

## 2021-04-18 NOTE — Assessment & Plan Note (Signed)
Chronic He does not feel like his anxiety is well controlled Mirtazapine does help him sleep and may help some with anxiety, but he still feels anxious at times He states he worries a lot He has difficulty swallowing pills because he is so concerned about choking-he has never choked.  He is in no difficulty with swallowing liquids or food He also states anxiety related to being in the shower or enclosed places He thinks he is having panic attacks for a while, but occur randomly He states he will get hot and then gets confused for a minute.  He will have some shortness of breath and both of these ears feel blocked-he is able to unblock the right ear He denies any chest pain or palpitations during these episodes We will discontinue mirtazapine at night Start sertraline 50 mg at bedtime Follow-up in 6 weeks

## 2021-04-18 NOTE — Assessment & Plan Note (Addendum)
Chronic On chronic oxygen via nasal cannula Gets short of breath without oxygen Denies coughing, wheezing Continue Anoro daily, albuterol as needed

## 2021-04-18 NOTE — Assessment & Plan Note (Signed)
Chronic Diet controlled Check A1c

## 2021-04-18 NOTE — Assessment & Plan Note (Signed)
Chronic Check lipid panel  Continue simvastatin 40 mg daily

## 2021-04-18 NOTE — Assessment & Plan Note (Signed)
Chronic Blood pressure elevated here today-higher on repeat Blood pressure at home is slightly elevated at times, but upon repeat after 5 minutes it is very good I had to increase any of his medication since his blood pressure is good on repeat because I am concerned about orthostatic hypotension and fall risk He will be following up in 6 weeks-he will continue to monitor his blood pressure until then Continue metoprolol XL 12.5 mg daily, enalapril 10 mg twice daily

## 2021-04-27 DIAGNOSIS — J439 Emphysema, unspecified: Secondary | ICD-10-CM | POA: Diagnosis not present

## 2021-05-01 ENCOUNTER — Telehealth: Payer: Self-pay

## 2021-05-01 MED ORDER — MIRTAZAPINE 15 MG PO TABS: 15.0000 mg | ORAL_TABLET | Freq: Every day | ORAL | 1 refills | Status: DC

## 2021-05-01 NOTE — Telephone Encounter (Signed)
Restart remeron - rx sent to [facility] ?

## 2021-05-01 NOTE — Telephone Encounter (Signed)
Pt calling stating that he has a reaction from sertraline (ZOLOFT) 50 MG tablet. Pt states that he was burning up from his feet to his head. Pt also reports that he had a hard time breathing.  ? ?Pt CB [phone] ?

## 2021-05-01 NOTE — Addendum Note (Signed)
Addended by: Binnie Rail on: 05/01/2021 12:08 PM ? ? Modules accepted: Orders ? ?

## 2021-05-17 ENCOUNTER — Encounter: Payer: Self-pay | Admitting: Podiatry

## 2021-05-17 ENCOUNTER — Ambulatory Visit (INDEPENDENT_AMBULATORY_CARE_PROVIDER_SITE_OTHER): Payer: Medicare Other | Admitting: Podiatry

## 2021-05-17 ENCOUNTER — Other Ambulatory Visit: Payer: Self-pay

## 2021-05-17 DIAGNOSIS — E1159 Type 2 diabetes mellitus with other circulatory complications: Secondary | ICD-10-CM

## 2021-05-17 DIAGNOSIS — M79675 Pain in left toe(s): Secondary | ICD-10-CM | POA: Diagnosis not present

## 2021-05-17 DIAGNOSIS — N183 Chronic kidney disease, stage 3 unspecified: Secondary | ICD-10-CM

## 2021-05-17 DIAGNOSIS — I739 Peripheral vascular disease, unspecified: Secondary | ICD-10-CM

## 2021-05-17 DIAGNOSIS — M79674 Pain in right toe(s): Secondary | ICD-10-CM | POA: Diagnosis not present

## 2021-05-17 DIAGNOSIS — B351 Tinea unguium: Secondary | ICD-10-CM | POA: Diagnosis not present

## 2021-05-17 NOTE — Progress Notes (Signed)
This patient returns to my office for at risk foot care.  This patient requires this care by a professional since this patient will be at risk due to having chronic kidney disease and diabetes.  This patient is unable to cut nails himself since the patient cannot reach his nails.These nails are painful walking and wearing shoes.  This patient presents for at risk foot care today. ? ?General Appearance  Alert, conversant and in no acute stress. ? ?Vascular  Dorsalis pedis and posterior tibial  pulses are absent   bilaterally.  Capillary return is within normal limits  bilaterally. Temperature is within normal limits  Bilaterally. Venous stasis  Left leg/foot. ? ?Neurologic  Senn-Weinstein monofilament wire test within normal limits  bilaterally. Muscle power within normal limits bilaterally. ? ?Nails Thick disfigured discolored nails with subungual debris  from hallux to fifth toes bilaterally. No evidence of bacterial infection or drainage bilaterally. ? ?Orthopedic  No limitations of motion  feet .  No crepitus or effusions noted.  No bony pathology or digital deformities noted.  HAV  B/L. ? ?Skin  normotropic skin with no porokeratosis noted bilaterally.  No signs of infections or ulcers noted.    ? ?Onychomycosis  Pain in right toes  Pain in left toes ? ?Consent was obtained for treatment procedures.   Mechanical debridement of nails 1-5  bilaterally performed with a nail nipper.  Filed with dremel without incident.  ? ? ?Return office visit  3 months                    Told patient to return for periodic foot care and evaluation due to potential at risk complications. ? ? ?Gardiner Barefoot DPM  ?

## 2021-05-28 DIAGNOSIS — J439 Emphysema, unspecified: Secondary | ICD-10-CM | POA: Diagnosis not present

## 2021-05-29 ENCOUNTER — Other Ambulatory Visit: Payer: Self-pay | Admitting: Internal Medicine

## 2021-05-31 ENCOUNTER — Encounter: Payer: Self-pay | Admitting: Internal Medicine

## 2021-05-31 NOTE — Progress Notes (Signed)
? ? ? ? ?Subjective:  ? ? Patient ID: Brian [name], male    DOB: Nov 09, 1931, 88 y.o.   MRN: [id number] ? ?This visit occurred during the SARS-CoV-2 public health emergency.  Safety protocols were in place, including screening questions prior to the visit, additional usage of staff PPE, and extensive cleaning of exam room while observing appropriate contact time as indicated for disinfecting solutions.   ? ? ?HPI ?Karlon is here for follow up of his chronic medical problems, -  ? ?Sleep difficulties - not well controlled - started sertraline ? ?Anxiety - not controlled - started sertraline 50 mg HS ? ?He did not tolerate the sertraline - it caused burning from his feet to his head. We restarted the remeron.  He is still not sleeping well.  He states he can fall asleep, but then wakes frequently and has difficulty getting back to sleep. ? ?He is falling asleep on his left side at times and his arm falls asleep.  Sometimes that goes away quickly once he gets up, but sometimes it does not.  He denies any numbness or tingling down the arm.  He denies any weakness.  He denies any neck pain. ? ?Afraid to get water in ears.  He tends to sleep more on his right side so he does not plug his left ear-that is good ear.  He gets anxious in shower-feels like it is closed off.  Sometimes if he leaves the curtain open on both ends a little bit helps, but not always..   ? ? ?Medications and allergies reviewed with patient and updated if appropriate. ? ?Current Outpatient Medications on File Prior to Visit  ?Medication Sig Dispense Refill  ? ACCU-CHEK COMPACT STRIPS test strip TEST BLOOD SUGAR ONCE DAILY 100 each 1  ? albuterol (VENTOLIN HFA) 108 (90 Base) MCG/ACT inhaler INHALE 2 PUFFS BY MOUTH FOUR TIMES A DAY    ? ANORO ELLIPTA 62.5-25 MCG/ACT AEPB USE 1 INHALATION DAILY 240 each 3  ? Aspirin 81 MG EC tablet Take 81 mg by mouth daily.    ? cholecalciferol (VITAMIN D) 1000 UNITS tablet Take 3,000 Units by mouth daily.     ?  enalapril (VASOTEC) 10 MG tablet TAKE 1 TABLET TWICE A DAY 180 tablet 0  ? Magnesium Oxide 420 MG TABS Take 420 mg by mouth daily.     ? metoprolol succinate (TOPROL-XL) 25 MG 24 hr tablet TAKE ONE-HALF (1/2) TABLET DAILY 45 tablet 3  ? mirtazapine (REMERON) 15 MG tablet Take 1 tablet (15 mg total) by mouth at bedtime. 90 tablet 1  ? Multiple Vitamins-Minerals (MULTIVITAL PO) Take 1 tablet by mouth daily.     ? Polyethyl Glycol-Propyl Glycol (SYSTANE) 0.4-0.3 % SOLN Place 1 drop into both eyes daily.    ? simvastatin (ZOCOR) 40 MG tablet TAKE 1 TABLET DAILY 90 tablet 3  ? triamcinolone cream (KENALOG) 0.1 % Apply 1 application topically daily as needed (Rash).    ? ?No current facility-administered medications on file prior to visit.  ? ? ? ?Review of Systems  ?Constitutional:  Negative for fever.  ?Respiratory:  Positive for shortness of breath. Negative for cough and wheezing.   ?Cardiovascular:  Negative for chest pain.  ?Gastrointestinal:  Negative for abdominal pain.  ?Musculoskeletal:  Negative for neck pain.  ?Neurological:  Positive for numbness (left arm). Negative for light-headedness and headaches.  ?Psychiatric/Behavioral:  Positive for sleep disturbance. The patient is nervous/anxious.   ? ?   ?Objective:  ? ?  Vitals:  ? 06/01/21 1014  ?BP: (!) 158/62  ?Pulse: (!) 59  ?Temp: 98 ?F (36.7 ?C)  ?SpO2: 90%  ? ?BP Readings from Last 3 Encounters:  ?06/01/21 (!) 158/62  ?04/18/21 (!) 178/62  ?03/13/21 (!) 158/78  ? ?Wt Readings from Last 3 Encounters:  ?06/01/21 157 lb (71.2 kg)  ?04/18/21 157 lb 2 oz (71.3 kg)  ?03/13/21 153 lb 14.4 oz (69.8 kg)  ? ?Body mass index is 21.9 kg/m?. ? ?  ?Physical Exam ?Constitutional:   ?   General: He is not in acute distress. ?   Appearance: Normal appearance.  ?HENT:  ?   Head: Normocephalic.  ?Musculoskeletal:     ?   General: No tenderness (Neck, upper back, left arm).  ?Skin: ?   General: Skin is warm and dry.  ?Neurological:  ?   Mental Status: He is alert.  ?   Sensory:  No sensory deficit (Left arm).  ?   Motor: No weakness (Left arm).  ?Psychiatric:     ?   Mood and Affect: Mood normal.     ?   Behavior: Behavior normal.  ? ?   ? ?Lab Results  ?Component Value Date  ? WBC 4.6 04/18/2021  ? HGB 11.7 (L) 04/18/2021  ? HCT 36.2 (L) 04/18/2021  ? PLT 145.0 (L) 04/18/2021  ? GLUCOSE 92 04/18/2021  ? CHOL 141 04/18/2021  ? TRIG 80.0 04/18/2021  ? HDL 53.90 04/18/2021  ? [place] 71 04/18/2021  ? ALT 9 04/18/2021  ? AST 15 04/18/2021  ? NA 140 04/18/2021  ? K 4.6 04/18/2021  ? CL 105 04/18/2021  ? CREATININE 1.44 04/18/2021  ? BUN 26 (H) 04/18/2021  ? CO2 27 04/18/2021  ? TSH 1.88 09/26/2016  ? PSA 1.42 07/16/2007  ? INR 1.2 01/21/2019  ? HGBA1C 6.5 04/18/2021  ? MICROALBUR 9.45 03/19/2018  ? ? ? ?Assessment & Plan:  ? ? ?See Problem List for Assessment and Plan of chronic medical problems.  ? ? ?

## 2021-05-31 NOTE — Patient Instructions (Addendum)
? ? ? ? ? ?  Medications changes include :   restoril 7.5-15 mg nightly ? ? ?Your prescription(s) have been sent to your pharmacy.  ? ? ? ? ?Return in about 6 months (around 12/01/2021) for 6 month f/u. ? ?

## 2021-06-01 ENCOUNTER — Ambulatory Visit (INDEPENDENT_AMBULATORY_CARE_PROVIDER_SITE_OTHER): Payer: Medicare Other | Admitting: Internal Medicine

## 2021-06-01 VITALS — BP 142/72 | HR 59 | Temp 98.0°F | Ht 71.0 in | Wt 157.0 lb

## 2021-06-01 DIAGNOSIS — G479 Sleep disorder, unspecified: Secondary | ICD-10-CM

## 2021-06-01 DIAGNOSIS — F419 Anxiety disorder, unspecified: Secondary | ICD-10-CM | POA: Diagnosis not present

## 2021-06-01 DIAGNOSIS — R200 Anesthesia of skin: Secondary | ICD-10-CM | POA: Diagnosis not present

## 2021-06-01 MED ORDER — MIRTAZAPINE 15 MG PO TABS
15.0000 mg | ORAL_TABLET | Freq: Every day | ORAL | 1 refills | Status: DC
Start: 2021-06-01 — End: 2021-09-27

## 2021-06-01 MED ORDER — TEMAZEPAM 7.5 MG PO CAPS: 7.5000 mg | ORAL_CAPSULE | Freq: Every evening | ORAL | 5 refills | Status: DC | PRN

## 2021-06-01 NOTE — Assessment & Plan Note (Signed)
Chronic ?Not controlled ?remeron not effective ?Start restoril 7.5-15 mg nightly ?

## 2021-06-01 NOTE — Assessment & Plan Note (Signed)
Chronic ?As generalized anxiety, but a couple of situations that also cause increased anxiety-in the shower and having his left ear clogged ?His left ear is his good urine I think there is some anxiety because of his left ear is clogged he will not be able to hear very well ?Opening the shower curtain a little on both arms sometimes helps-recommended getting a clear shower curtain to see if that helps ?We will continue Remeron 15 mg at bedtime-its not doing much for sleep, but may be helping his underlying anxiety ?

## 2021-06-01 NOTE — Assessment & Plan Note (Signed)
New ?He states he has been sleeping different and feels the numbness only when he wakes up ?Typically goes away after being up for a little while, once in a while it seems to last longer ?Asymptomatic now ?No neck pain, no left arm weakness, no chest pain ?Discussed referral to specialist-likely cervical radiculopathy-he deferred for now, but will let me know if his symptoms persist or worsen and I can refer him ?

## 2021-06-20 ENCOUNTER — Ambulatory Visit (INDEPENDENT_AMBULATORY_CARE_PROVIDER_SITE_OTHER): Payer: Medicare Other | Admitting: Internal Medicine

## 2021-06-20 ENCOUNTER — Encounter: Payer: Self-pay | Admitting: Internal Medicine

## 2021-06-20 VITALS — BP 140/64 | HR 61 | Temp 98.3°F | Ht 71.0 in | Wt 156.0 lb

## 2021-06-20 DIAGNOSIS — R197 Diarrhea, unspecified: Secondary | ICD-10-CM

## 2021-06-20 DIAGNOSIS — I10 Essential (primary) hypertension: Secondary | ICD-10-CM | POA: Diagnosis not present

## 2021-06-20 LAB — CBC WITH DIFFERENTIAL/PLATELET
Basophils Absolute: 0 10*3/uL (ref 0.0–0.1)
Basophils Relative: 0.8 % (ref 0.0–3.0)
Eosinophils Absolute: 0.1 10*3/uL (ref 0.0–0.7)
Eosinophils Relative: 2.7 % (ref 0.0–5.0)
HCT: 33.4 % — ABNORMAL LOW (ref 39.0–52.0)
Hemoglobin: 11.1 g/dL — ABNORMAL LOW (ref 13.0–17.0)
Lymphocytes Relative: 28.6 % (ref 12.0–46.0)
Lymphs Abs: 1.4 10*3/uL (ref 0.7–4.0)
MCHC: 33.2 g/dL (ref 30.0–36.0)
MCV: 90.4 fl (ref 78.0–100.0)
Monocytes Absolute: 0.6 10*3/uL (ref 0.1–1.0)
Monocytes Relative: 11.4 % (ref 3.0–12.0)
Neutro Abs: 2.8 10*3/uL (ref 1.4–7.7)
Neutrophils Relative %: 56.5 % (ref 43.0–77.0)
Platelets: 159 10*3/uL (ref 150.0–400.0)
RBC: 3.7 Mil/uL — ABNORMAL LOW (ref 4.22–5.81)
RDW: 15.2 % (ref 11.5–15.5)
WBC: 4.9 10*3/uL (ref 4.0–10.5)

## 2021-06-20 LAB — COMPREHENSIVE METABOLIC PANEL
ALT: 9 U/L (ref 0–53)
AST: 15 U/L (ref 0–37)
Albumin: 4.4 g/dL (ref 3.5–5.2)
Alkaline Phosphatase: 68 U/L (ref 39–117)
BUN: 22 mg/dL (ref 6–23)
CO2: 26 mEq/L (ref 19–32)
Calcium: 9.2 mg/dL (ref 8.4–10.5)
Chloride: 105 mEq/L (ref 96–112)
Creatinine, Ser: 1.8 mg/dL — ABNORMAL HIGH (ref 0.40–1.50)
GFR: 33.45 mL/min — ABNORMAL LOW (ref >60.00)
Glucose, Bld: 86 mg/dL (ref 70–99)
Potassium: 4.6 mEq/L (ref 3.5–5.1)
Sodium: 139 mEq/L (ref 135–145)
Total Bilirubin: 0.5 mg/dL (ref 0.2–1.2)
Total Protein: 7.4 g/dL (ref 6.0–8.3)

## 2021-06-20 NOTE — Patient Instructions (Addendum)
? ?  Eat a bland diet.  Drink lots of fluids.  Continue the anti-diarrheal medication as needed.   ? ? ?Blood work was ordered.   ? ? ?Medications changes include :   none ? ? ? ?Return if symptoms worsen or fail to improve. ? ? ? ?

## 2021-06-20 NOTE — Assessment & Plan Note (Signed)
Chronic ?Blood pressure initially slightly elevated, but better on repeat ?No change in medications ?Continue metoprolol XL 12.5 mg daily, enalapril 10 mg twice daily ?

## 2021-06-20 NOTE — Assessment & Plan Note (Signed)
Acute ?Had episode of diarrhea that started about 10 days ago and it did resolve on its own after few days.  He stopped eating anything dairy related.  He is not sure why it started or why it stopped.  Yesterday it started again and he did take an antidiarrheal for the first time and it stopped.  He has not had any diarrhea since then.  He denies nausea, vomiting, abdominal pain, blood in the stool, fevers or cold symptoms.  He has been eating normally. ?He denies any sick contacts, unusual foods or recent antibiotics ?He often eats with a friend and she has not been sick ?We will check CBC, CMP ?At this point advised bland diet, increase fluids, continuing to avoid milk products ?Okay to take loperamide as needed for diarrhea ?If the diarrhea persists or does not completely resolve he will let me know so that we can pursue further evaluation, but will hold off now since he is not having any pain or other concerning symptoms ?

## 2021-06-20 NOTE — Progress Notes (Signed)
? ? ?Subjective:  ? ? Patient ID: Brian [name], male    DOB: 07-22-1931, 88 y.o.   MRN: [id number] ? ?This visit occurred during the SARS-CoV-2 public health emergency.  Safety protocols were in place, including screening questions prior to the visit, additional usage of staff PPE, and extensive cleaning of exam room while observing appropriate contact time as indicated for disinfecting solutions. ? ? ? ?HPI ?Neyland is here for  ?Chief Complaint  ?Patient presents with  ? Diarrhea  ?  Diarrhea x 1 week; Better today  ? ? ? ?Diarrhea started about 10 days ago - during the week went away.   It stopped on its own. ? ?Last Saturday - salmon, sweet potato green beans and did just fine.  Yesterday he ate corn beef cabbage, and a few other things and an hour after eating he had diarrhea again.  He had antidiarrheal medication which sounds like generic Imodium on hand and he took it and had stopped the diarrhea.  That is the first time he took that medication. ? ?He denies any abdominal pain, heartburn or nausea.  He denies fevers, cold symptoms, blood in the stool ? ?He has not eaten any milk.  ? ? ? ?Medications and allergies reviewed with patient and updated if appropriate. ? ?Current Outpatient Medications on File Prior to Visit  ?Medication Sig Dispense Refill  ? ACCU-CHEK COMPACT STRIPS test strip TEST BLOOD SUGAR ONCE DAILY 100 each 1  ? albuterol (VENTOLIN HFA) 108 (90 Base) MCG/ACT inhaler INHALE 2 PUFFS BY MOUTH FOUR TIMES A DAY    ? ANORO ELLIPTA 62.5-25 MCG/ACT AEPB USE 1 INHALATION DAILY 240 each 3  ? Aspirin 81 MG EC tablet Take 81 mg by mouth daily.    ? cholecalciferol (VITAMIN D) 1000 UNITS tablet Take 3,000 Units by mouth daily.     ? enalapril (VASOTEC) 10 MG tablet TAKE 1 TABLET TWICE A DAY 180 tablet 0  ? Magnesium Oxide 420 MG TABS Take 420 mg by mouth daily.     ? metoprolol succinate (TOPROL-XL) 25 MG 24 hr tablet TAKE ONE-HALF (1/2) TABLET DAILY 45 tablet 3  ? mirtazapine (REMERON) 15 MG tablet  Take 1 tablet (15 mg total) by mouth at bedtime. 90 tablet 1  ? Multiple Vitamins-Minerals (MULTIVITAL PO) Take 1 tablet by mouth daily.     ? Polyethyl Glycol-Propyl Glycol (SYSTANE) 0.4-0.3 % SOLN Place 1 drop into both eyes daily.    ? simvastatin (ZOCOR) 40 MG tablet TAKE 1 TABLET DAILY 90 tablet 3  ? temazepam (RESTORIL) 7.5 MG capsule Take 1-2 capsules (7.5-15 mg total) by mouth at bedtime as needed for sleep. 60 capsule 5  ? triamcinolone cream (KENALOG) 0.1 % Apply 1 application topically daily as needed (Rash).    ? ?No current facility-administered medications on file prior to visit.  ? ? ?Review of Systems  ?Constitutional:  Negative for fever.  ?HENT:  Negative for congestion and sore throat.   ?Respiratory:  Negative for cough.   ?Gastrointestinal:  Positive for diarrhea. Negative for abdominal pain, blood in stool (no black stool), nausea and vomiting.  ?Neurological:  Negative for light-headedness and headaches.  ? ?   ?Objective:  ? ?Vitals:  ? 06/20/21 1431  ?BP: (!) 148/58  ?Pulse: 61  ?Temp: 98.3 ?F (36.8 ?C)  ?SpO2: 92%  ? ?BP Readings from Last 3 Encounters:  ?06/20/21 (!) 148/58  ?06/01/21 (!) 142/72  ?04/18/21 (!) 178/62  ? ?Wt Readings from Last 3  Encounters:  ?06/20/21 156 lb (70.8 kg)  ?06/01/21 157 lb (71.2 kg)  ?04/18/21 157 lb 2 oz (71.3 kg)  ? ?Body mass index is 21.76 kg/m?. ? ?  ?Physical Exam ?Constitutional:   ?   Appearance: Normal appearance.  ?HENT:  ?   Head: Normocephalic and atraumatic.  ?Abdominal:  ?   General: There is no distension.  ?   Palpations: Abdomen is soft.  ?   Tenderness: There is no abdominal tenderness. There is no guarding or rebound.  ?   Comments: Increased BM's  ?Skin: ?   General: Skin is warm and dry.  ?Neurological:  ?   Mental Status: He is alert.  ? ?   ? ? ? ? ? ?Assessment & Plan:  ? ? ?See Problem List for Assessment and Plan of chronic medical problems.  ? ? ? ? ?

## 2021-06-27 DIAGNOSIS — J439 Emphysema, unspecified: Secondary | ICD-10-CM | POA: Diagnosis not present

## 2021-07-10 ENCOUNTER — Ambulatory Visit (HOSPITAL_COMMUNITY)
Admission: RE | Admit: 2021-07-10 | Discharge: 2021-07-10 | Disposition: A | Discharge status: Home / Self Care | Payer: Medicare Other | Source: Ambulatory Visit | Attending: Radiation Oncology | Admitting: Radiation Oncology

## 2021-07-10 DIAGNOSIS — J90 Pleural effusion, not elsewhere classified: Secondary | ICD-10-CM | POA: Diagnosis not present

## 2021-07-10 DIAGNOSIS — J432 Centrilobular emphysema: Secondary | ICD-10-CM | POA: Diagnosis not present

## 2021-07-10 DIAGNOSIS — R911 Solitary pulmonary nodule: Secondary | ICD-10-CM | POA: Diagnosis not present

## 2021-07-10 DIAGNOSIS — R918 Other nonspecific abnormal finding of lung field: Secondary | ICD-10-CM | POA: Insufficient documentation

## 2021-07-10 DIAGNOSIS — C3490 Malignant neoplasm of unspecified part of unspecified bronchus or lung: Secondary | ICD-10-CM | POA: Diagnosis not present

## 2021-07-12 NOTE — Progress Notes (Signed)
Radiation Oncology         [phone] ________________________________  Name: Brian [name] MRN: [id number]  Date: 07/13/2021  DOB: December 12, 1931  Follow-Up Visit Note  CC: Binnie Rail, MD  Collene Gobble, MD  No diagnosis found.  Diagnosis: Pet positive Right upper lobe lung nodule, suspicious for primary bronchogenic carcinoma  Interval Since Last Radiation: 1 year, 3 months, and 29 days   Radiation Treatment Dates: 03/09/2020 through 03/16/2020   Site: Right lung Technique: SBRT Total Dose (Gy): 54/54 Dose per Fx (Gy): 18 Completed Fx: 3/3 Beam Energies: 6XFFF  Narrative:  The patient returns today for routine follow-up and to review recent imaging, he was last seen here for follow up on 01/09/21.  His most recent chest CT on 07/10/21 showed stable post therapy related findings in the right upper lobe and stability of the 9 by 10 mm RUL nodule. CT also showed stability of the 6 mm LUL nodule, as well as a new trace right pleural effusion.           Otherwise, no significant oncologic history in the interval since his last visit.   ***                 Allergies:  is allergic to meperidine hcl, sertraline, gabapentin, meperidine, zolpidem, and zolpidem tartrate.  Meds: Current Outpatient Medications  Medication Sig Dispense Refill   ACCU-CHEK COMPACT STRIPS test strip TEST BLOOD SUGAR ONCE DAILY 100 each 1   albuterol (VENTOLIN HFA) 108 (90 Base) MCG/ACT inhaler INHALE 2 PUFFS BY MOUTH FOUR TIMES A DAY     ANORO ELLIPTA 62.5-25 MCG/ACT AEPB USE 1 INHALATION DAILY 240 each 3   Aspirin 81 MG EC tablet Take 81 mg by mouth daily.     cholecalciferol (VITAMIN D) 1000 UNITS tablet Take 3,000 Units by mouth daily.      enalapril (VASOTEC) 10 MG tablet TAKE 1 TABLET TWICE A DAY 180 tablet 0   Magnesium Oxide 420 MG TABS Take 420 mg by mouth daily.      metoprolol succinate (TOPROL-XL) 25 MG 24 hr tablet TAKE ONE-HALF (1/2) TABLET DAILY 45 tablet 3   mirtazapine (REMERON) 15  MG tablet Take 1 tablet (15 mg total) by mouth at bedtime. 90 tablet 1   Multiple Vitamins-Minerals (MULTIVITAL PO) Take 1 tablet by mouth daily.      Polyethyl Glycol-Propyl Glycol (SYSTANE) 0.4-0.3 % SOLN Place 1 drop into both eyes daily.     simvastatin (ZOCOR) 40 MG tablet TAKE 1 TABLET DAILY 90 tablet 3   temazepam (RESTORIL) 7.5 MG capsule Take 1-2 capsules (7.5-15 mg total) by mouth at bedtime as needed for sleep. 60 capsule 5   triamcinolone cream (KENALOG) 0.1 % Apply 1 application topically daily as needed (Rash).     No current facility-administered medications for this encounter.    Physical Findings: The patient is in no acute distress. Patient is alert and oriented.  vitals were not taken for this visit. .  No significant changes. Lungs are clear to auscultation bilaterally. Heart has regular rate and rhythm. No palpable cervical, supraclavicular, or axillary adenopathy. Abdomen soft, non-tender, normal bowel sounds. ***   Lab Findings: Lab Results  Component Value Date   WBC 4.9 06/20/2021   HGB 11.1 (L) 06/20/2021   HCT 33.4 (L) 06/20/2021   MCV 90.4 06/20/2021   PLT 159.0 06/20/2021    Radiographic Findings: CT CHEST WO CONTRAST  Result Date: 07/11/2021 CLINICAL DATA:  Non-small cell  lung cancer restaging. * Tracking Code: BO * EXAM: CT CHEST WITHOUT CONTRAST TECHNIQUE: Multidetector CT imaging of the chest was performed following the standard protocol without IV contrast. RADIATION DOSE REDUCTION: This exam was performed according to the departmental dose-optimization program which includes automated exposure control, adjustment of the mA and/or kV according to patient size and/or use of iterative reconstruction technique. COMPARISON:  01/04/2021 FINDINGS: Cardiovascular: Coronary, aortic arch, and branch vessel atherosclerotic vascular disease. Prior CABG. Stable mild prominence of the main pulmonary artery suggesting pulmonary arterial hypertension. Mediastinum/Nodes:  Hypodense left supraclavicular node 0.8 cm in short axis on image 15 series 2, present but not hypermetabolic on 01/60/1093, no change from 01/04/2021, likely benign/incidental. No pathologic adenopathy identified. Lungs/Pleura: Prominent centrilobular emphysema. Stable post therapy related findings in the right upper lobe in the vicinity of a 9 by 10 mm nodule on image 65 series 7, no change from 01/04/2021. Stable 6 mm left apical nodule on image 37 series 7, not previously hypermetabolic, no substantial morphologic change from 12/29/2018. New trace right pleural effusion Upper Abdomen: Abdominal aortic atherosclerosis. We image the top of an abdominal aortic stent. Musculoskeletal: Thoracic spondylosis.  Prior median sternotomy. IMPRESSION: 1. Stable post therapy related findings in the right upper lobe in the vicinity of a unchanged 9 by 10 mm nodule. 2. Unchanged 6 mm left upper lobe nodule. This is not morphologically changed from 12/29/2018. 3. New trace right pleural effusion. 4. Other imaging findings of potential clinical significance: Aortic Atherosclerosis (ICD10-I70.0) and Emphysema (ICD10-J43.9). Coronary atherosclerosis. Prominent pulmonary artery compatible with pulmonary arterial hypertension. Electronically Signed   By: Van Clines M.D.   On: 07/11/2021 18:48    Impression:  Pet positive Right upper lobe lung nodule, suspicious for primary bronchogenic carcinoma  The patient is recovering from the effects of radiation.  ***  Plan:  ***   *** minutes of total time was spent for this patient encounter, including preparation, face-to-face counseling with the patient and coordination of care, physical exam, and documentation of the encounter. ____________________________________  Blair Promise, PhD, MD  This document serves as a record of services personally performed by Gery Pray, MD. It was created on his behalf by Roney Mans, a trained medical scribe. The creation of  this record is based on the scribe's personal observations and the provider's statements to them. This document has been checked and approved by the attending provider.

## 2021-07-13 ENCOUNTER — Ambulatory Visit
Admission: RE | Admit: 2021-07-13 | Discharge: 2021-07-13 | Disposition: A | Discharge status: Home / Self Care | Payer: Medicare Other | Source: Ambulatory Visit | Attending: Radiation Oncology | Admitting: Radiation Oncology

## 2021-07-13 ENCOUNTER — Encounter: Payer: Self-pay | Admitting: Radiation Oncology

## 2021-07-13 DIAGNOSIS — Z79899 Other long term (current) drug therapy: Secondary | ICD-10-CM | POA: Diagnosis not present

## 2021-07-13 DIAGNOSIS — J432 Centrilobular emphysema: Secondary | ICD-10-CM | POA: Insufficient documentation

## 2021-07-13 DIAGNOSIS — J90 Pleural effusion, not elsewhere classified: Secondary | ICD-10-CM | POA: Diagnosis not present

## 2021-07-13 DIAGNOSIS — M47814 Spondylosis without myelopathy or radiculopathy, thoracic region: Secondary | ICD-10-CM | POA: Diagnosis not present

## 2021-07-13 DIAGNOSIS — I700 Atherosclerosis of aorta: Secondary | ICD-10-CM | POA: Insufficient documentation

## 2021-07-13 DIAGNOSIS — I2510 Atherosclerotic heart disease of native coronary artery without angina pectoris: Secondary | ICD-10-CM | POA: Diagnosis not present

## 2021-07-13 DIAGNOSIS — R918 Other nonspecific abnormal finding of lung field: Secondary | ICD-10-CM

## 2021-07-13 DIAGNOSIS — Z923 Personal history of irradiation: Secondary | ICD-10-CM | POA: Diagnosis not present

## 2021-07-13 DIAGNOSIS — R911 Solitary pulmonary nodule: Secondary | ICD-10-CM | POA: Diagnosis not present

## 2021-07-13 DIAGNOSIS — Z7982 Long term (current) use of aspirin: Secondary | ICD-10-CM | POA: Insufficient documentation

## 2021-07-13 NOTE — Progress Notes (Signed)
Brian [name] is here today for follow up post radiation to the lung.  Lung Side: Right,patient completed treatment on 03/16/20  Does the patient complain of any of the following: Pain:No Shortness of breath w/wo exertion:  Yes, mostly on exertion. Patient currently on O2 at 2 L via [place].  Cough: No Hemoptysis: No Pain with swallowing: No Swallowing/choking concerns: No Appetite: Good Weights:  Wt Readings from Last 3 Encounters:  07/13/21 155 lb 12.8 oz (70.7 kg)  06/20/21 156 lb (70.8 kg)  06/01/21 157 lb (71.2 kg)    Energy Level: Good  Post radiation skin Changes: No    Additional comments if applicable:   BP (!) 972/82 (BP Location: Left Arm, Patient Position: Sitting, Cuff Size: Normal)   Pulse 65   Temp (!) 97.5 F (36.4 C)   Resp 20   Ht 5\' 11"  (1.803 m)   Wt 155 lb 12.8 oz (70.7 kg)   SpO2 96%   PF (!) 2 L/min   BMI 21.73 kg/m

## 2021-07-14 ENCOUNTER — Telehealth: Payer: Self-pay | Admitting: *Deleted

## 2021-07-14 NOTE — Telephone Encounter (Signed)
Called patient to inform of CT for 01-10-22- arrival time- 10:30 am @ [facility], no restrictions to test, patient to receive results from Dr. Sondra Come on Nov. 30 @ 11am, lvm for a return call

## 2021-07-28 DIAGNOSIS — J439 Emphysema, unspecified: Secondary | ICD-10-CM | POA: Diagnosis not present

## 2021-08-11 ENCOUNTER — Encounter: Payer: Self-pay | Admitting: Podiatry

## 2021-08-18 ENCOUNTER — Ambulatory Visit: Payer: Medicare Other | Admitting: Podiatry

## 2021-08-23 ENCOUNTER — Telehealth: Payer: Self-pay | Admitting: Internal Medicine

## 2021-08-23 NOTE — Telephone Encounter (Signed)
PT is requesting a callback. He said he is having anxiety attacks during the day time now and would like to know if he can take mirtazapine (REMERON) 15 MG tablet twice daily. He would like to know if he can take one pill in the morning and one pill at night.    Please advise.  CB: [phone] or [phone] Ok to lmom.

## 2021-08-23 NOTE — Telephone Encounter (Signed)
Spoke with patient and info given 

## 2021-08-24 ENCOUNTER — Ambulatory Visit (INDEPENDENT_AMBULATORY_CARE_PROVIDER_SITE_OTHER): Payer: Medicare Other

## 2021-08-24 DIAGNOSIS — Z0000 Encounter for general adult medical examination without abnormal findings: Secondary | ICD-10-CM | POA: Diagnosis not present

## 2021-08-24 NOTE — Patient Instructions (Signed)
[name] , Thank you for taking time to come for your Medicare Wellness Visit. I appreciate your ongoing commitment to your health goals. Please review the following plan we discussed and let me know if I can assist you in the future.   Screening recommendations/referrals: Colonoscopy: Discontinued due to age Recommended yearly ophthalmology/optometry visit for glaucoma screening and checkup Recommended yearly dental visit for hygiene and checkup  Vaccinations: Influenza vaccine: 10/19/2020 Pneumococcal vaccine: 04/28/2008, 03/09/2015 Tdap vaccine: 04/25/2021; due every 10 years Shingles vaccine: 10/03/2016, 12/02/2016   Covid-19: 03/15/2019, 04/12/2019, 12/24/2019, 11/17/2020  Advanced directives: Yes; Please bring a copy of your health care power of attorney and living will to the office at your convenience.  Conditions/risks identified: Yes; Client understands the importance of follow-up appointments with providers by attending scheduled visits and discussed goals to eat healthier, increase physical activity 5 times a week for 30 minutes each, exercise the brain by doing stimulating brain exercises (reading, adult coloring, crafting, listening to music, puzzles, etc.), socialize and enjoy life more, get enough sleep at least 8-9 hours average per night and make time for laughter.  Next appointment: Please schedule your next Medicare Wellness Visit with your Nurse Health Advisor in 1 year by calling [phone].  Preventive Care 86 Years and Older, Male Preventive care refers to lifestyle choices and visits with your health care provider that can promote health and wellness. What does preventive care include? A yearly physical exam. This is also called an annual well check. Dental exams once or twice a year. Routine eye exams. Ask your health care provider how often you should have your eyes checked. Personal lifestyle choices, including: Daily care of your teeth and gums. Regular physical  activity. Eating a healthy diet. Avoiding tobacco and drug use. Limiting alcohol use. Practicing safe sex. Taking low doses of aspirin every day. Taking vitamin and mineral supplements as recommended by your health care provider. What happens during an annual well check? The services and screenings done by your health care provider during your annual well check will depend on your age, overall health, lifestyle risk factors, and family history of disease. Counseling  Your health care provider may ask you questions about your: Alcohol use. Tobacco use. Drug use. Emotional well-being. Home and relationship well-being. Sexual activity. Eating habits. History of falls. Memory and ability to understand (cognition). Work and work Statistician. Screening  You may have the following tests or measurements: Height, weight, and BMI. Blood pressure. Lipid and cholesterol levels. These may be checked every 5 years, or more frequently if you are over 69 years old. Skin check. Lung cancer screening. You may have this screening every year starting at age 14 if you have a 30-pack-year history of smoking and currently smoke or have quit within the past 15 years. Fecal occult blood test (FOBT) of the stool. You may have this test every year starting at age 29. Flexible sigmoidoscopy or colonoscopy. You may have a sigmoidoscopy every 5 years or a colonoscopy every 10 years starting at age 20. Prostate cancer screening. Recommendations will vary depending on your family history and other risks. Hepatitis C blood test. Hepatitis B blood test. Sexually transmitted disease (STD) testing. Diabetes screening. This is done by checking your blood sugar (glucose) after you have not eaten for a while (fasting). You may have this done every 1-3 years. Abdominal aortic aneurysm (AAA) screening. You may need this if you are a current or former smoker. Osteoporosis. You may be screened starting at age 108  if you are  at high risk. Talk with your health care provider about your test results, treatment options, and if necessary, the need for more tests. Vaccines  Your health care provider may recommend certain vaccines, such as: Influenza vaccine. This is recommended every year. Tetanus, diphtheria, and acellular pertussis (Tdap, Td) vaccine. You may need a Td booster every 10 years. Zoster vaccine. You may need this after age 66. Pneumococcal 13-valent conjugate (PCV13) vaccine. One dose is recommended after age 20. Pneumococcal polysaccharide (PPSV23) vaccine. One dose is recommended after age 78. Talk to your health care provider about which screenings and vaccines you need and how often you need them. This information is not intended to replace advice given to you by your health care provider. Make sure you discuss any questions you have with your health care provider. Document Released: 03/04/2015 Document Revised: 10/26/2015 Document Reviewed: 12/07/2014 Elsevier Interactive Patient Education  2017 [place] Prevention in the Home Falls can cause injuries. They can happen to people of all ages. There are many things you can do to make your home safe and to help prevent falls. What can I do on the outside of my home? Regularly fix the edges of walkways and driveways and fix any cracks. Remove anything that might make you trip as you walk through a door, such as a raised step or threshold. Trim any bushes or trees on the path to your home. Use bright outdoor lighting. Clear any walking paths of anything that might make someone trip, such as rocks or tools. Regularly check to see if handrails are loose or broken. Make sure that both sides of any steps have handrails. Any raised decks and porches should have guardrails on the edges. Have any leaves, snow, or ice cleared regularly. Use sand or salt on walking paths during winter. Clean up any spills in your garage right away. This includes oil  or grease spills. What can I do in the bathroom? Use night lights. Install grab bars by the toilet and in the tub and shower. Do not use towel bars as grab bars. Use non-skid mats or decals in the tub or shower. If you need to sit down in the shower, use a plastic, non-slip stool. Keep the floor dry. Clean up any water that spills on the floor as soon as it happens. Remove soap buildup in the tub or shower regularly. Attach bath mats securely with double-sided non-slip rug tape. Do not have throw rugs and other things on the floor that can make you trip. What can I do in the bedroom? Use night lights. Make sure that you have a light by your bed that is easy to reach. Do not use any sheets or blankets that are too big for your bed. They should not hang down onto the floor. Have a firm chair that has side arms. You can use this for support while you get dressed. Do not have throw rugs and other things on the floor that can make you trip. What can I do in the kitchen? Clean up any spills right away. Avoid walking on wet floors. Keep items that you use a lot in easy-to-reach places. If you need to reach something above you, use a strong step stool that has a grab bar. Keep electrical cords out of the way. Do not use floor polish or wax that makes floors slippery. If you must use wax, use non-skid floor wax. Do not have throw rugs and other things  on the floor that can make you trip. What can I do with my stairs? Do not leave any items on the stairs. Make sure that there are handrails on both sides of the stairs and use them. Fix handrails that are broken or loose. Make sure that handrails are as long as the stairways. Check any carpeting to make sure that it is firmly attached to the stairs. Fix any carpet that is loose or worn. Avoid having throw rugs at the top or bottom of the stairs. If you do have throw rugs, attach them to the floor with carpet tape. Make sure that you have a light  switch at the top of the stairs and the bottom of the stairs. If you do not have them, ask someone to add them for you. What else can I do to help prevent falls? Wear shoes that: Do not have high heels. Have rubber bottoms. Are comfortable and fit you well. Are closed at the toe. Do not wear sandals. If you use a stepladder: Make sure that it is fully opened. Do not climb a closed stepladder. Make sure that both sides of the stepladder are locked into place. Ask someone to hold it for you, if possible. Clearly mark and make sure that you can see: Any grab bars or handrails. First and last steps. Where the edge of each step is. Use tools that help you move around (mobility aids) if they are needed. These include: Canes. Walkers. Scooters. Crutches. Turn on the lights when you go into a dark area. Replace any light bulbs as soon as they burn out. Set up your furniture so you have a clear path. Avoid moving your furniture around. If any of your floors are uneven, fix them. If there are any pets around you, be aware of where they are. Review your medicines with your doctor. Some medicines can make you feel dizzy. This can increase your chance of falling. Ask your doctor what other things that you can do to help prevent falls. This information is not intended to replace advice given to you by your health care provider. Make sure you discuss any questions you have with your health care provider. Document Released: 12/02/2008 Document Revised: 07/14/2015 Document Reviewed: 03/12/2014 Elsevier Interactive Patient Education  2017 [employer].

## 2021-08-24 NOTE — Progress Notes (Cosign Needed Addendum)
I connected with Brian [name] today by telephone and verified that I am speaking with the correct person using two identifiers. Location patient: home Location provider: work Persons participating in the virtual visit: patient, provider.   I discussed the limitations, risks, security and privacy concerns of performing an evaluation and management service by telephone and the availability of in person appointments. I also discussed with the patient that there may be a patient responsible charge related to this service. The patient expressed understanding and verbally consented to this telephonic visit.    Interactive audio and video telecommunications were attempted between this provider and patient, however failed, due to patient having technical difficulties OR patient did not have access to video capability.  We continued and completed visit with audio only.  Some vital signs may be absent or patient reported.   Time Spent with patient on telephone encounter: 30 minutes  Subjective:   Brian [name] is a 88 y.o. male who presents for Medicare Annual/Subsequent preventive examination.  Review of Systems     Cardiac Risk Factors include: advanced age (>71men, >67 women);dyslipidemia;hypertension;male gender;family history of premature cardiovascular disease     Objective:    There were no vitals filed for this visit. There is no height or weight on file to calculate BMI.     08/24/2021    4:07 PM 07/13/2021   10:28 AM 01/09/2021   10:47 AM 12/22/2020    9:09 AM 11/08/2020    9:38 AM 07/25/2020   11:30 AM 04/21/2020   11:17 AM  Advanced Directives  Does Patient Have a Medical Advance Directive? Yes Yes Yes Yes Yes Yes   Type of Advance Directive Living will;Healthcare Power of Attorney   Living will Living will;Healthcare Power of Attorney    Does patient want to make changes to medical advance directive? No - Patient declined No - Patient declined No - Patient declined No - Patient  declined No - Patient declined No - Patient declined   Copy of [place] in Chart? No - copy requested      Yes - validated most recent copy scanned in chart (See row information)    Current Medications (verified) Outpatient Encounter Medications as of 08/24/2021  Medication Sig   ACCU-CHEK COMPACT STRIPS test strip TEST BLOOD SUGAR ONCE DAILY   albuterol (VENTOLIN HFA) 108 (90 Base) MCG/ACT inhaler INHALE 2 PUFFS BY MOUTH FOUR TIMES A DAY   ANORO ELLIPTA 62.5-25 MCG/ACT AEPB USE 1 INHALATION DAILY   Aspirin 81 MG EC tablet Take 81 mg by mouth daily.   cholecalciferol (VITAMIN D) 1000 UNITS tablet Take 3,000 Units by mouth daily.    enalapril (VASOTEC) 10 MG tablet TAKE 1 TABLET TWICE A DAY   Magnesium Oxide 420 MG TABS Take 420 mg by mouth daily.    metoprolol succinate (TOPROL-XL) 25 MG 24 hr tablet TAKE ONE-HALF (1/2) TABLET DAILY   mirtazapine (REMERON) 15 MG tablet Take 1 tablet (15 mg total) by mouth at bedtime.   Multiple Vitamins-Minerals (MULTIVITAL PO) Take 1 tablet by mouth daily.    Polyethyl Glycol-Propyl Glycol (SYSTANE) 0.4-0.3 % SOLN Place 1 drop into both eyes daily.   simvastatin (ZOCOR) 40 MG tablet TAKE 1 TABLET DAILY   temazepam (RESTORIL) 7.5 MG capsule Take 1-2 capsules (7.5-15 mg total) by mouth at bedtime as needed for sleep.   triamcinolone cream (KENALOG) 0.1 % Apply 1 application topically daily as needed (Rash).   No facility-administered encounter medications on file as of  08/24/2021.    Allergies (verified) Meperidine hcl, Sertraline, Gabapentin, Meperidine, Zolpidem, and Zolpidem tartrate   History: Past Medical History:  Diagnosis Date   AAA (abdominal aortic aneurysm) ([place])    Arthritis    Asthma 1976   Bronchiolitis 06/2014   CAD (coronary artery disease)    NSTEMI 02/2010;  s/p CABG 1/12 (L-LAD, SOM1, S-PDA); echo in 08/2010: Mild LVH, EF 73%, grade 2 diastolic dysfunction, mild MR, mild LAE, mildly reduced RV function, mild RAE,  PASP 42   Carpal tunnel syndrome of left wrist    Colon polyps    COPD (chronic obstructive pulmonary disease) ([place])    Diabetes mellitus 2000   Type II, controlls with diet   Dyspnea    ED (erectile dysfunction)    Gout    History of radiation therapy 03/09/2020-03/16/2020   SBRT right lung: Dr. Gery Pray   HTN (hypertension)    Hyperlipidemia    Inguinal hernia    Right   Macular degeneration    Myocardial infarction [facility]) [phone]   Neuropathy    Past Surgical History:  Procedure Laterality Date   3 vessel CABG  03/03/2010   ABDOMINAL AORTIC ENDOVASCULAR STENT GRAFT N/A 01/21/2019   Procedure: ABDOMINAL AORTIC ENDOVASCULAR STENT GRAFT;  Surgeon: Waynetta Sandy, MD;  Location: [place];  Service: Vascular;  Laterality: N/A;   cataract left eye     COLONOSCOPY     neg 2009   COLONOSCOPY  2014   5 mm sessile polyp; AVM   CORONARY ARTERY BYPASS GRAFT  2012   X3   ENDOVASCULAR REPAIR/STENT GRAFT  01/21/2019   ABDOMINAL AORTIC ENDOVASCULAR STENT GRAFT (N/A )   EYE SURGERY     Left cataract   HERNIA REPAIR     INCISION AND DRAINAGE PERIRECTAL ABSCESS     INGUINAL HERNIA REPAIR     Family History  Problem Relation Age of Onset   Heart failure Mother    Heart disease Mother        CHF   Heart attack Mother    Hypertension Mother    Heart attack Father    Heart disease Father 49       MI   Hypertension Father    Heart attack Sister 61   Heart disease Sister 9       MI   Diabetes Sister    Hyperlipidemia Sister    Hypertension Sister    Varicose Veins Sister    Heart attack Brother 77   Diabetes Brother    Heart disease Brother 47       MI   Cancer Brother    Hypertension Brother    Deep vein thrombosis Daughter    Leukemia Daughter    Other Son        varicose veins   Arthritis/Rheumatoid Son    Lupus Daughter    Colon cancer Neg Hx    Social History   Socioeconomic History   Marital status: Widowed    Spouse name: Not on file   Number  of children: 3   Years of education: 72   Highest education level: Not on file  Occupational History   Occupation: RETIRED    Employer: RETIRED  Tobacco Use   Smoking status: Former    Packs/day: 1.00    Years: 40.00    Total pack years: 40.00    Types: Cigarettes    Quit date: 02/20/2003    Years since quitting: 18.5   Smokeless tobacco:  Never  Vaping Use   Vaping Use: Never used  Substance and Sexual Activity   Alcohol use: Not Currently    Alcohol/week: 6.0 standard drinks of alcohol    Types: 3 Glasses of wine, 3 Shots of liquor per week    Comment:  1-3 drinks a week   Drug use: No   Sexual activity: Not on file  Other Topics Concern   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    One story home   Right handed   3 children   Social Determinants of Health   Financial Resource Strain: Low Risk  (08/24/2021)   Overall Financial Resource Strain (CARDIA)    Difficulty of Paying Living Expenses: Not hard at all  Food Insecurity: No Food Insecurity (08/24/2021)   Hunger Vital Sign    Worried About Running Out of Food in the Last Year: Never true    Ran Out of Food in the Last Year: Never true  Transportation Needs: No Transportation Needs (08/24/2021)   PRAPARE - Hydrologist (Medical): No    Lack of Transportation (Non-Medical): No  Physical Activity: Inactive (08/24/2021)   Exercise Vital Sign    Days of Exercise per Week: 0 days    Minutes of Exercise per Session: 0 min  Stress: No Stress Concern Present (08/24/2021)   [place]    Feeling of Stress : Not at all  Social Connections: Moderately Integrated (08/24/2021)   Social Connection and Isolation Panel [NHANES]    Frequency of Communication with Friends and Family: More than three times a week    Frequency of Social Gatherings with Friends and Family: More than three  times a week    Attends Religious Services: More than 4 times per year    Active Member of [employer] or Organizations: Yes    Attends Archivist Meetings: More than 4 times per year    Marital Status: Widowed    Tobacco Counseling Counseling given: Not Answered   Clinical Intake:  Pre-visit preparation completed: Yes  Pain : No/denies pain     BMI - recorded: 21.74 Nutritional Status: BMI of 19-24  Normal Nutritional Risks: None Diabetes: Yes CBG done?: No Did pt. bring in CBG monitor from home?: No  How often do you need to have someone help you when you read instructions, pamphlets, or other written materials from your doctor or pharmacy?: 1 - Never What is the last grade level you completed in school?: HSG; some college  Diabetic? yes  Interpreter Needed?: No  Information entered by :: Lisette Abu, LPN.   Activities of Daily Living    08/24/2021    4:13 PM  In your present state of health, do you have any difficulty performing the following activities:  Hearing? 0  Vision? 0  Difficulty concentrating or making decisions? 0  Walking or climbing stairs? 0  Dressing or bathing? 0  Doing errands, shopping? 0  Preparing Food and eating ? N  Using the Toilet? N  In the past six months, have you accidently leaked urine? N  Do you have problems with loss of bowel control? N  Managing your Medications? N  Managing your Finances? N  Housekeeping or managing your Housekeeping? N    Patient Care Team: Binnie Rail, MD as PCP - General (Internal Medicine) Burnell Blanks, MD as PCP -  Cardiology (Cardiology) Alda Berthold, DO as Consulting Physician (Neurology) Collene Gobble, MD as Consulting Physician (Pulmonary Disease)  Indicate any recent Medical Services you may have received from other than [facility] providers in the past year (date may be approximate).     Assessment:   This is a routine wellness examination for [place].  Hearing/Vision  screen Hearing Screening - Comments:: No hearing aids. Vision Screening - Comments:: Patient does wear corrective lenses/contacts.  Eye exam done by: [facility]   Dietary issues and exercise activities discussed: Current Exercise Habits: Home exercise routine, Type of exercise: walking, Time (Minutes): 25, Frequency (Times/Week): 6, Weekly Exercise (Minutes/Week): 150, Intensity: Mild, Exercise limited by: respiratory conditions(s)   Goals Addressed             This Visit's Progress    Patient declined health goal at this time.        Depression Screen    08/24/2021    4:12 PM 04/18/2021   11:16 AM 04/18/2021   11:00 AM 04/20/2020    8:32 AM 04/17/2019    8:23 AM 04/01/2018    8:31 AM 09/26/2017    8:55 AM  PHQ 2/9 Scores  PHQ - 2 Score 2 2 0 0 0 0 0  PHQ- 9 Score  4         Fall Risk    08/24/2021    4:11 PM 04/18/2021   11:16 AM 04/18/2021   11:00 AM 04/20/2020    8:28 AM 04/17/2019   11:23 AM  Fall Risk   Falls in the past year? 0 0 0 0 0  Number falls in past yr: 0 0  0 0  Injury with Fall? 0 0  0 0  Risk for fall due to : No Fall Risks No Fall Risks  No Fall Risks   Follow up Falls evaluation completed Falls evaluation completed  Falls evaluation completed     [place]:  Any stairs in or around the home? No  If so, are there any without handrails? No  Home free of loose throw rugs in walkways, pet beds, electrical cords, etc? Yes  Adequate lighting in your home to reduce risk of falls? Yes   ASSISTIVE DEVICES UTILIZED TO PREVENT FALLS:  Life alert? No  Use of a cane, walker or w/c? No  Grab bars in the bathroom? No  Shower chair or bench in shower? No  Elevated toilet seat or a handicapped toilet? No   TIMED UP AND GO:  Was the test performed? No .  Length of time to ambulate 10 feet: n/a sec.   Appearance of gait: Gait not evaluated during this visit.  Cognitive Function:        08/24/2021    4:18 PM  6CIT Screen   What Year? 0 points  What month? 0 points  What time? 0 points  Count back from 20 0 points  Months in reverse 0 points  Repeat phrase 0 points  Total Score 0 points    Immunizations Immunization History  Administered Date(s) Administered   Fluad Quad(high Dose 65+) 10/15/2018, 12/02/2019, 10/19/2020   Influenza Split 12/21/2010, 12/05/2011   Influenza Whole 01/01/2007, 12/09/2008, 11/24/2009   Influenza, High Dose Seasonal PF 11/08/2015, 11/22/2017   Influenza,inj,Quad PF,6+ Mos 11/20/2012, 10/29/2013, 11/23/2014   Influenza-Unspecified 12/11/2016, 10/21/2019, 10/19/2020   Moderna Covid-19 Vaccine Bivalent Booster 80yrs & up 11/17/2020   Moderna Sars-Covid-2 Vaccination 03/15/2019, 04/12/2019, 12/24/2019   Pneumococcal Conjugate-13 03/10/2014, 03/09/2015  Pneumococcal Polysaccharide-23 02/20/2008   Pneumococcal-Unspecified 10/21/2002, 04/28/2008   Td 07/16/2007, 04/25/2021   Tdap 02/26/2011   Zoster Recombinat (Shingrix) 12/02/2016   Zoster, Live 07/16/2007    TDAP status: Up to date  Flu Vaccine status: Up to date  Pneumococcal vaccine status: Up to date  Covid-19 vaccine status: Completed vaccines  Qualifies for Shingles Vaccine? Yes   Zostavax completed Yes   Shingrix Completed?: Yes  Screening Tests Health Maintenance  Topic Date Due   Zoster Vaccines- Shingrix (2 of 2) 01/27/2017   OPHTHALMOLOGY EXAM  11/24/2019   INFLUENZA VACCINE  09/19/2021   HEMOGLOBIN A1C  10/16/2021   FOOT EXAM  02/08/2022   TETANUS/TDAP  04/26/2031   Pneumonia Vaccine 65+ Years old  Completed   COVID-19 Vaccine  Completed   HPV VACCINES  Aged Out    Health Maintenance  Health Maintenance Due  Topic Date Due   Zoster Vaccines- Shingrix (2 of 2) 01/27/2017   OPHTHALMOLOGY EXAM  11/24/2019    Colorectal cancer screening: No longer required.   Lung Cancer Screening: (Low Dose CT Chest recommended if Age 72-80 years, 30 pack-year currently smoking OR have quit w/in 15years.)  does not qualify.   Lung Cancer Screening Referral: no  Additional Screening:  Hepatitis C Screening: does not qualify; Completed no  Vision Screening: Recommended annual ophthalmology exams for early detection of glaucoma and other disorders of the eye. Is the patient up to date with their annual eye exam?  Yes  Who is the provider or what is the name of the office in which the patient attends annual eye exams? [facility] If pt is not established with a provider, would they like to be referred to a provider to establish care? No .   Dental Screening: Recommended annual dental exams for proper oral hygiene  Community Resource Referral / Chronic Care Management: CRR required this visit?  No   CCM required this visit?  No      Plan:     I have personally reviewed and noted the following in the patient's chart:   Medical and social history Use of alcohol, tobacco or illicit drugs  Current medications and supplements including opioid prescriptions. Patient is not currently taking opioid prescriptions. Functional ability and status Nutritional status Physical activity Advanced directives List of other physicians Hospitalizations, surgeries, and ER visits in previous 12 months Vitals Screenings to include cognitive, depression, and falls Referrals and appointments  In addition, I have reviewed and discussed with patient certain preventive protocols, quality metrics, and best practice recommendations. A written personalized care plan for preventive services as well as general preventive health recommendations were provided to patient.     Sheral Flow, LPN   02/24/1094   Nurse Notes:  There were no vitals filed for this visit. There is no height or weight on file to calculate BMI.  Medical screening examination/treatment/procedure(s) were performed by non-physician practitioner and as supervising physician I was immediately available for consultation/collaboration.   I agree with above. Lew Dawes, MD

## 2021-08-27 DIAGNOSIS — J439 Emphysema, unspecified: Secondary | ICD-10-CM | POA: Diagnosis not present

## 2021-08-28 ENCOUNTER — Other Ambulatory Visit: Payer: Self-pay | Admitting: Internal Medicine

## 2021-09-12 ENCOUNTER — Other Ambulatory Visit: Payer: Self-pay

## 2021-09-12 ENCOUNTER — Encounter (HOSPITAL_BASED_OUTPATIENT_CLINIC_OR_DEPARTMENT_OTHER): Payer: Self-pay | Admitting: Emergency Medicine

## 2021-09-12 ENCOUNTER — Emergency Department (HOSPITAL_BASED_OUTPATIENT_CLINIC_OR_DEPARTMENT_OTHER): Payer: No Typology Code available for payment source

## 2021-09-12 ENCOUNTER — Inpatient Hospital Stay (HOSPITAL_BASED_OUTPATIENT_CLINIC_OR_DEPARTMENT_OTHER)
Admission: EM | Admit: 2021-09-12 | Discharge: 2021-09-14 | DRG: 291 | Disposition: A | Discharge status: Home / Self Care | Payer: No Typology Code available for payment source | Attending: Internal Medicine | Admitting: Internal Medicine

## 2021-09-12 DIAGNOSIS — Z20822 Contact with and (suspected) exposure to covid-19: Secondary | ICD-10-CM | POA: Diagnosis present

## 2021-09-12 DIAGNOSIS — Z923 Personal history of irradiation: Secondary | ICD-10-CM | POA: Diagnosis not present

## 2021-09-12 DIAGNOSIS — J9811 Atelectasis: Secondary | ICD-10-CM | POA: Diagnosis not present

## 2021-09-12 DIAGNOSIS — J9621 Acute and chronic respiratory failure with hypoxia: Secondary | ICD-10-CM | POA: Diagnosis not present

## 2021-09-12 DIAGNOSIS — J9601 Acute respiratory failure with hypoxia: Secondary | ICD-10-CM | POA: Diagnosis present

## 2021-09-12 DIAGNOSIS — J439 Emphysema, unspecified: Secondary | ICD-10-CM | POA: Diagnosis not present

## 2021-09-12 DIAGNOSIS — I252 Old myocardial infarction: Secondary | ICD-10-CM

## 2021-09-12 DIAGNOSIS — E785 Hyperlipidemia, unspecified: Secondary | ICD-10-CM | POA: Diagnosis present

## 2021-09-12 DIAGNOSIS — I4510 Unspecified right bundle-branch block: Secondary | ICD-10-CM | POA: Diagnosis present

## 2021-09-12 DIAGNOSIS — F410 Panic disorder [episodic paroxysmal anxiety] without agoraphobia: Secondary | ICD-10-CM | POA: Diagnosis present

## 2021-09-12 DIAGNOSIS — M109 Gout, unspecified: Secondary | ICD-10-CM | POA: Diagnosis present

## 2021-09-12 DIAGNOSIS — R001 Bradycardia, unspecified: Secondary | ICD-10-CM | POA: Diagnosis present

## 2021-09-12 DIAGNOSIS — I10 Essential (primary) hypertension: Secondary | ICD-10-CM | POA: Diagnosis present

## 2021-09-12 DIAGNOSIS — I341 Nonrheumatic mitral (valve) prolapse: Secondary | ICD-10-CM | POA: Diagnosis present

## 2021-09-12 DIAGNOSIS — F0150 Vascular dementia without behavioral disturbance: Secondary | ICD-10-CM

## 2021-09-12 DIAGNOSIS — R0602 Shortness of breath: Secondary | ICD-10-CM | POA: Diagnosis not present

## 2021-09-12 DIAGNOSIS — Z7982 Long term (current) use of aspirin: Secondary | ICD-10-CM

## 2021-09-12 DIAGNOSIS — N184 Chronic kidney disease, stage 4 (severe): Secondary | ICD-10-CM | POA: Diagnosis present

## 2021-09-12 DIAGNOSIS — Z79899 Other long term (current) drug therapy: Secondary | ICD-10-CM

## 2021-09-12 DIAGNOSIS — Z7951 Long term (current) use of inhaled steroids: Secondary | ICD-10-CM

## 2021-09-12 DIAGNOSIS — J449 Chronic obstructive pulmonary disease, unspecified: Secondary | ICD-10-CM | POA: Diagnosis present

## 2021-09-12 DIAGNOSIS — Z951 Presence of aortocoronary bypass graft: Secondary | ICD-10-CM | POA: Diagnosis not present

## 2021-09-12 DIAGNOSIS — I5021 Acute systolic (congestive) heart failure: Secondary | ICD-10-CM | POA: Diagnosis present

## 2021-09-12 DIAGNOSIS — E1159 Type 2 diabetes mellitus with other circulatory complications: Secondary | ICD-10-CM | POA: Diagnosis present

## 2021-09-12 DIAGNOSIS — R0902 Hypoxemia: Secondary | ICD-10-CM

## 2021-09-12 DIAGNOSIS — Z833 Family history of diabetes mellitus: Secondary | ICD-10-CM

## 2021-09-12 DIAGNOSIS — J90 Pleural effusion, not elsewhere classified: Secondary | ICD-10-CM

## 2021-09-12 DIAGNOSIS — Z888 Allergy status to other drugs, medicaments and biological substances status: Secondary | ICD-10-CM | POA: Diagnosis not present

## 2021-09-12 DIAGNOSIS — E1122 Type 2 diabetes mellitus with diabetic chronic kidney disease: Secondary | ICD-10-CM | POA: Diagnosis not present

## 2021-09-12 DIAGNOSIS — I5032 Chronic diastolic (congestive) heart failure: Secondary | ICD-10-CM

## 2021-09-12 DIAGNOSIS — Z9981 Dependence on supplemental oxygen: Secondary | ICD-10-CM

## 2021-09-12 DIAGNOSIS — R0609 Other forms of dyspnea: Secondary | ICD-10-CM | POA: Diagnosis not present

## 2021-09-12 DIAGNOSIS — I509 Heart failure, unspecified: Secondary | ICD-10-CM

## 2021-09-12 DIAGNOSIS — C3411 Malignant neoplasm of upper lobe, right bronchus or lung: Secondary | ICD-10-CM | POA: Diagnosis not present

## 2021-09-12 DIAGNOSIS — I2510 Atherosclerotic heart disease of native coronary artery without angina pectoris: Secondary | ICD-10-CM | POA: Diagnosis present

## 2021-09-12 DIAGNOSIS — I130 Hypertensive heart and chronic kidney disease with heart failure and stage 1 through stage 4 chronic kidney disease, or unspecified chronic kidney disease: Principal | ICD-10-CM | POA: Diagnosis present

## 2021-09-12 DIAGNOSIS — Z87891 Personal history of nicotine dependence: Secondary | ICD-10-CM

## 2021-09-12 DIAGNOSIS — I441 Atrioventricular block, second degree: Secondary | ICD-10-CM

## 2021-09-12 DIAGNOSIS — H3530 Unspecified macular degeneration: Secondary | ICD-10-CM | POA: Diagnosis present

## 2021-09-12 DIAGNOSIS — Z83438 Family history of other disorder of lipoprotein metabolism and other lipidemia: Secondary | ICD-10-CM

## 2021-09-12 DIAGNOSIS — I288 Other diseases of pulmonary vessels: Secondary | ICD-10-CM | POA: Diagnosis not present

## 2021-09-12 DIAGNOSIS — N1832 Chronic kidney disease, stage 3b: Secondary | ICD-10-CM | POA: Diagnosis present

## 2021-09-12 DIAGNOSIS — Z8249 Family history of ischemic heart disease and other diseases of the circulatory system: Secondary | ICD-10-CM

## 2021-09-12 DIAGNOSIS — N1831 Chronic kidney disease, stage 3a: Secondary | ICD-10-CM | POA: Diagnosis not present

## 2021-09-12 DIAGNOSIS — R600 Localized edema: Secondary | ICD-10-CM | POA: Diagnosis not present

## 2021-09-12 DIAGNOSIS — C3490 Malignant neoplasm of unspecified part of unspecified bronchus or lung: Secondary | ICD-10-CM | POA: Diagnosis not present

## 2021-09-12 DIAGNOSIS — F32A Depression, unspecified: Secondary | ICD-10-CM | POA: Diagnosis present

## 2021-09-12 LAB — I-STAT VENOUS BLOOD GAS, ED
Acid-Base Excess: 1 mmol/L (ref 0.0–2.0)
Bicarbonate: 28.1 mmol/L — ABNORMAL HIGH (ref 20.0–28.0)
Calcium, Ion: 1.21 mmol/L (ref 1.15–1.40)
HCT: 37 % — ABNORMAL LOW (ref 39.0–52.0)
Hemoglobin: 12.6 g/dL — ABNORMAL LOW (ref 13.0–17.0)
O2 Saturation: 43 %
Potassium: 3.9 mmol/L (ref 3.5–5.1)
Sodium: 142 mmol/L (ref 135–145)
TCO2: 30 mmol/L (ref 22–32)
pCO2, Ven: 51.6 mmHg (ref 44–60)
pH, Ven: 7.344 (ref 7.25–7.43)
pO2, Ven: 26 mmHg — CL (ref 32–45)

## 2021-09-12 LAB — CBC
HCT: 35.6 % — ABNORMAL LOW (ref 39.0–52.0)
Hemoglobin: 10.9 g/dL — ABNORMAL LOW (ref 13.0–17.0)
MCH: 27.9 pg (ref 26.0–34.0)
MCHC: 30.6 g/dL (ref 30.0–36.0)
MCV: 91.3 fL (ref 80.0–100.0)
Platelets: 193 10*3/uL (ref 150–400)
RBC: 3.9 MIL/uL — ABNORMAL LOW (ref 4.22–5.81)
RDW: 15 % (ref 11.5–15.5)
WBC: 4.7 10*3/uL (ref 4.0–10.5)
nRBC: 0 % (ref 0.0–0.2)

## 2021-09-12 LAB — BRAIN NATRIURETIC PEPTIDE: B Natriuretic Peptide: 690.9 pg/mL — ABNORMAL HIGH (ref 0.0–100.0)

## 2021-09-12 LAB — RESP PANEL BY RT-PCR (FLU A&B, COVID) ARPGX2
Influenza A by PCR: NEGATIVE
Influenza B by PCR: NEGATIVE
SARS Coronavirus 2 by RT PCR: NEGATIVE

## 2021-09-12 LAB — BLOOD GAS, VENOUS
Acid-Base Excess: 6.8 mmol/L — ABNORMAL HIGH (ref 0.0–2.0)
Bicarbonate: 32.8 mmol/L — ABNORMAL HIGH (ref 20.0–28.0)
Drawn by: 1390
O2 Saturation: 60.6 %
Patient temperature: 36.7
pCO2, Ven: 52 mmHg (ref 44–60)
pH, Ven: 7.4 (ref 7.25–7.43)
pO2, Ven: 41 mmHg (ref 32–45)

## 2021-09-12 LAB — COMPREHENSIVE METABOLIC PANEL
ALT: 15 U/L (ref 0–44)
AST: 15 U/L (ref 15–41)
Albumin: 4.7 g/dL (ref 3.5–5.0)
Alkaline Phosphatase: 62 U/L (ref 38–126)
Anion gap: 13 (ref 5–15)
BUN: 20 mg/dL (ref 8–23)
CO2: 27 mmol/L (ref 22–32)
Calcium: 9.8 mg/dL (ref 8.9–10.3)
Chloride: 103 mmol/L (ref 98–111)
Creatinine, Ser: 1.29 mg/dL — ABNORMAL HIGH (ref 0.61–1.24)
GFR, Estimated: 54 mL/min — ABNORMAL LOW (ref >60)
Glucose, Bld: 110 mg/dL — ABNORMAL HIGH (ref 70–99)
Potassium: 4 mmol/L (ref 3.5–5.1)
Sodium: 143 mmol/L (ref 135–145)
Total Bilirubin: 0.7 mg/dL (ref 0.3–1.2)
Total Protein: 7.7 g/dL (ref 6.5–8.1)

## 2021-09-12 LAB — TROPONIN I (HIGH SENSITIVITY)
Troponin I (High Sensitivity): 45 ng/L — ABNORMAL HIGH (ref <18)
Troponin I (High Sensitivity): 43 ng/L — ABNORMAL HIGH (ref <18)

## 2021-09-12 LAB — CBG MONITORING, ED: Glucose-Capillary: 115 mg/dL — ABNORMAL HIGH (ref 70–99)

## 2021-09-12 MED ORDER — ACETAMINOPHEN 500 MG PO TABS
1000.0000 mg | ORAL_TABLET | Freq: Once | ORAL | Status: AC
  Administered 2021-09-12: 1000 mg via ORAL
  Filled 2021-09-12: qty 2

## 2021-09-12 MED ORDER — SODIUM CHLORIDE 0.9 % IV SOLN
INTRAVENOUS | Status: DC | PRN
  Administered 2021-09-12: 10 mL/h via INTRAVENOUS

## 2021-09-12 MED ORDER — FUROSEMIDE 10 MG/ML IJ SOLN
60.0000 mg | Freq: Once | INTRAMUSCULAR | Status: AC
  Administered 2021-09-12: 60 mg via INTRAVENOUS
  Filled 2021-09-12: qty 6

## 2021-09-12 MED ORDER — SODIUM CHLORIDE 0.9 % IV SOLN
500.0000 mg | Freq: Once | INTRAVENOUS | Status: AC
  Administered 2021-09-12: 500 mg via INTRAVENOUS
  Filled 2021-09-12: qty 5

## 2021-09-12 MED ORDER — IOHEXOL 350 MG/ML SOLN
75.0000 mL | Freq: Once | INTRAVENOUS | Status: AC | PRN
  Administered 2021-09-12: 75 mL via INTRAVENOUS

## 2021-09-12 MED ORDER — SODIUM CHLORIDE 0.9 % IV SOLN
1.0000 g | Freq: Once | INTRAVENOUS | Status: AC
  Administered 2021-09-12: 1 g via INTRAVENOUS
  Filled 2021-09-12: qty 10

## 2021-09-12 NOTE — ED Provider Notes (Addendum)
[place] EMERGENCY DEPARTMENT Provider Note   CSN: [id number] Arrival date & time: 09/12/21  1432     History  Chief Complaint  Patient presents with   Anxiety   Hypoxia    Brian [name] is a 88 y.o. male.  Patient is a 88 year old male with a history of hypertension, COPD, diabetes, AAA repair in the past, coronary artery disease, chronic kidney disease and recent lung cancer status post radiation treatment who presents with shortness of breath and anxiety.  History is obtained from the patient and the patient's son who is at bedside.  He has had a lot of anxiety lately and has started having panic attacks.  He had an appointment today at the [facility] as a walk-in to assess his panic attacks.  This was the behavioral health area of the [facility].  When he was there, they noted that his oxygen saturation was low.  He is on home oxygen at 2 L/min and the son says that the concentrator was working fine at that time.  The battery did die and they had to change out the battery so he did have a short time where it was not working but at the time they checked his oxygen saturation it was working normally.  He does report that he has had some progressively worsening shortness of breath over the last several months.  He gets winded even walking short distances.  He does have shortness of breath also associated with the panic attacks.  He denies any cough or cold symptoms.  He has had some increased leg swelling he normally has some swelling of his left leg but now he is having swelling of his right leg which she does not normally have.  No known fevers.  No associated chest pain or tightness.       Home Medications Prior to Admission medications   Medication Sig Start Date End Date Taking? Authorizing Provider  ACCU-CHEK COMPACT STRIPS test strip TEST BLOOD SUGAR ONCE DAILY 08/18/11   Hendricks Limes, MD  albuterol (VENTOLIN HFA) 108 (90 Base) MCG/ACT inhaler INHALE 2 PUFFS  BY MOUTH FOUR TIMES A DAY 04/22/20   [provider]  Celedonio Miyamoto 62.5-25 MCG/ACT AEPB USE 1 INHALATION DAILY 04/18/21   Binnie Rail, MD  Aspirin 81 MG EC tablet Take 81 mg by mouth daily.    [provider]  cholecalciferol (VITAMIN D) 1000 UNITS tablet Take 3,000 Units by mouth daily.     [provider]  enalapril (VASOTEC) 10 MG tablet TAKE 1 TABLET TWICE A DAY 08/28/21   Binnie Rail, MD  Magnesium Oxide 420 MG TABS Take 420 mg by mouth daily.     [provider]  metoprolol succinate (TOPROL-XL) 25 MG 24 hr tablet TAKE ONE-HALF (1/2) TABLET DAILY 04/18/21   Binnie Rail, MD  mirtazapine (REMERON) 15 MG tablet Take 1 tablet (15 mg total) by mouth at bedtime. 06/01/21   Binnie Rail, MD  Multiple Vitamins-Minerals (MULTIVITAL PO) Take 1 tablet by mouth daily.     [provider]  Polyethyl Glycol-Propyl Glycol (SYSTANE) 0.4-0.3 % SOLN Place 1 drop into both eyes daily.    [provider]  simvastatin (ZOCOR) 40 MG tablet TAKE 1 TABLET DAILY 04/18/21   Binnie Rail, MD  temazepam (RESTORIL) 7.5 MG capsule Take 1-2 capsules (7.5-15 mg total) by mouth at bedtime as needed for sleep. 06/01/21   Binnie Rail, MD  triamcinolone cream (  KENALOG) 0.1 % Apply 1 application topically daily as needed (Rash).    [provider]      Allergies    Meperidine hcl, Sertraline, Gabapentin, Meperidine, Zolpidem, and Zolpidem tartrate    Review of Systems   Review of Systems  Constitutional:  Positive for fatigue. Negative for chills, diaphoresis and fever.  HENT:  Negative for congestion, rhinorrhea and sneezing.   Eyes: Negative.   Respiratory:  Positive for chest tightness and shortness of breath. Negative for cough.   Cardiovascular:  Positive for leg swelling. Negative for chest pain.  Gastrointestinal:  Negative for abdominal pain, blood in stool, diarrhea, nausea and vomiting.  Genitourinary:  Negative for difficulty urinating,  flank pain, frequency and hematuria.  Musculoskeletal:  Negative for arthralgias and back pain.  Skin:  Negative for rash.  Neurological:  Negative for dizziness, speech difficulty, weakness, numbness and headaches.  Psychiatric/Behavioral:  Positive for sleep disturbance. Negative for suicidal ideas. The patient is nervous/anxious.     Physical Exam Updated Vital Signs BP (!) 196/83   Pulse 69   Temp 98 F (36.7 C)   Resp (!) 29   Ht 5\' 11"  (1.803 m)   Wt 70.7 kg   SpO2 93%   BMI 21.74 kg/m  Physical Exam Constitutional:      Appearance: He is well-developed.  HENT:     Head: Normocephalic and atraumatic.  Eyes:     Pupils: Pupils are equal, round, and reactive to light.  Cardiovascular:     Rate and Rhythm: Normal rate and regular rhythm.     Heart sounds: Normal heart sounds.  Pulmonary:     Effort: Pulmonary effort is normal. No respiratory distress.     Breath sounds: Normal breath sounds. No wheezing or rales.     Comments: Tachypneic Chest:     Chest wall: No tenderness.  Abdominal:     General: Bowel sounds are normal.     Palpations: Abdomen is soft.     Tenderness: There is no abdominal tenderness. There is no guarding or rebound.  Musculoskeletal:        General: Normal range of motion.     Cervical back: Normal range of motion and neck supple.     Right lower leg: Edema present.     Left lower leg: Edema present.     Comments: 2+ pain edema to lower extremities bilaterally, the left is slightly greater than the right  Lymphadenopathy:     Cervical: No cervical adenopathy.  Skin:    General: Skin is warm and dry.     Findings: No rash.  Neurological:     Mental Status: He is alert and oriented to person, place, and time.     ED Results / Procedures / Treatments   Labs (all labs ordered are listed, but only abnormal results are displayed) Labs Reviewed  COMPREHENSIVE METABOLIC PANEL - Abnormal; Notable for the following components:      Result  Value   Glucose, Bld 110 (*)    Creatinine, Ser 1.29 (*)    GFR, Estimated 54 (*)    All other components within normal limits  CBC - Abnormal; Notable for the following components:   RBC 3.90 (*)    Hemoglobin 10.9 (*)    HCT 35.6 (*)    All other components within normal limits  BRAIN NATRIURETIC PEPTIDE - Abnormal; Notable for the following components:   B Natriuretic Peptide 690.9 (*)    All other components within normal  limits  I-STAT VENOUS BLOOD GAS, ED - Abnormal; Notable for the following components:   pO2, Ven 26 (*)    Bicarbonate 28.1 (*)    HCT 37.0 (*)    Hemoglobin 12.6 (*)    All other components within normal limits  CBG MONITORING, ED - Abnormal; Notable for the following components:   Glucose-Capillary 115 (*)    All other components within normal limits  TROPONIN I (HIGH SENSITIVITY) - Abnormal; Notable for the following components:   Troponin I (High Sensitivity) 45 (*)    All other components within normal limits  TROPONIN I (HIGH SENSITIVITY) - Abnormal; Notable for the following components:   Troponin I (High Sensitivity) 43 (*)    All other components within normal limits  RESP PANEL BY RT-PCR (FLU A&B, COVID) ARPGX2  BLOOD GAS, VENOUS    EKG EKG Interpretation  Date/Time:  Tuesday September 12 2021 17:51:09 EDT Ventricular Rate:  71 PR Interval:  244 QRS Duration: 144 QT Interval:  449 QTC Calculation: 488 R Axis:   -85 Text Interpretation: sinus rhythm with PACs RBBB and LAFB Confirmed by Malvin Johns [phone]) on 09/12/2021 7:22:53 PM  Radiology CT Angio Chest PE W/Cm &/Or Wo Cm  Result Date: 09/12/2021 CLINICAL DATA:  Chest pain or shortness of breath with pleurisy or effusion suspected. EXAM: CT ANGIOGRAPHY CHEST WITH CONTRAST TECHNIQUE: Multidetector CT imaging of the chest was performed using the standard protocol during bolus administration of intravenous contrast. Multiplanar CT image reconstructions and MIPs were obtained to evaluate the  vascular anatomy. RADIATION DOSE REDUCTION: This exam was performed according to the departmental dose-optimization program which includes automated exposure control, adjustment of the mA and/or kV according to patient size and/or use of iterative reconstruction technique. CONTRAST:  55mL OMNIPAQUE IOHEXOL 350 MG/ML SOLN COMPARISON:  07/10/2021 FINDINGS: Cardiovascular: Images somewhat laid after contrast bolus administration but there is moderately good opacification of the central and segmental pulmonary arteries. No focal filling defects are identified. No evidence of significant pulmonary embolus. Central pulmonary arteries are mildly dilated which may provide evidence of pulmonary arterial hypertension. Mild cardiac enlargement. No pericardial effusions. Normal caliber thoracic aorta. No aortic dissection. Calcification of the aorta and coronary arteries. Postoperative changes consistent with coronary bypass. Mediastinum/Nodes: Thyroid gland is unremarkable. No significant lymphadenopathy. Esophagus is decompressed. Lungs/Pleura: Small right pleural effusion with mild basilar atelectasis or consolidation. This is likely compressive atelectasis but could indicate pneumonia. Mild atelectasis in the left base. Diffuse emphysematous changes throughout the lungs. No pneumothorax. Upper Abdomen: No acute abnormalities. Musculoskeletal: Degenerative changes. Sternotomy wires. No acute bony abnormalities. Review of the MIP images confirms the above findings. IMPRESSION: 1. No evidence of significant pulmonary embolus. 2. Dilated central pulmonary arteries may indicate pulmonary arterial hypertension. 3. Right pleural effusion with atelectasis or infiltration in the right lung base. Mild atelectasis in the left base. 4. Aortic atherosclerosis. 5. Diffuse emphysematous changes in the lungs. Electronically Signed   By: Lucienne Capers M.D.   On: 09/12/2021 19:04   US Venous Img Lower Bilateral  Result Date:  09/12/2021 CLINICAL DATA:  Bilateral leg edema EXAM: Bilateral LOWER EXTREMITY VENOUS DOPPLER ULTRASOUND TECHNIQUE: Gray-scale sonography with compression, as well as color and duplex ultrasound, were performed to evaluate the deep venous system(s) from the level of the common femoral vein through the popliteal and proximal calf veins. COMPARISON:  None Available. FINDINGS: VENOUS Normal compressibility of the common femoral, superficial femoral, and popliteal veins, as well as the visualized calf veins. Visualized portions  of profunda femoral vein and great saphenous vein unremarkable. No filling defects to suggest DVT on grayscale or color Doppler imaging. Doppler waveforms show normal direction of venous flow, normal respiratory plasticity and response to augmentation. OTHER None. Limitations: none IMPRESSION: Negative. Electronically Signed   By: Donavan Foil M.D.   On: 09/12/2021 16:22   DG Chest 2 View  Result Date: 09/12/2021 CLINICAL DATA:  An 88 year old male presents for evaluation of shortness of breath. EXAM: CHEST - 2 VIEW COMPARISON:  May 26, 2020 and Jul 11, 2021 chest radiograph and CT respectively. FINDINGS: Opacity in the RIGHT mid chest corresponding the patient's known treated lung cancer is similar to the previous chest CT scout image from May of 2023. Post median sternotomy for CABG with stable mediastinal contours mild cardiac enlargement and mild fullness of the RIGHT and LEFT hilum. Lungs are hyperinflated. Increased blunting of RIGHT costodiaphragmatic sulcus. No pneumothorax. On limited assessment there is no acute skeletal finding. IMPRESSION: Increased blunting of the RIGHT costodiaphragmatic sulcus. Finding could reflect worsening of pleural fluid and or airspace disease. Signs of COPD with evidence of prior treated lung cancer not substantially changed from prior imaging. Post median sternotomy for CABG. Electronically Signed   By: Zetta Bills M.D.   On: 09/12/2021 15:01     Procedures Procedures    Medications Ordered in ED Medications  azithromycin (ZITHROMAX) 500 mg in sodium chloride 0.9 % 250 mL IVPB (500 mg Intravenous New Bag/Given 09/12/21 2151)  0.9 %  sodium chloride infusion (10 mL/hr Intravenous New Bag/Given 09/12/21 2101)  furosemide (LASIX) injection 60 mg (60 mg Intravenous Given 09/12/21 1707)  iohexol (OMNIPAQUE) 350 MG/ML injection 75 mL (75 mLs Intravenous Contrast Given 09/12/21 1834)  acetaminophen (TYLENOL) tablet 1,000 mg (1,000 mg Oral Given 09/12/21 2058)  cefTRIAXone (ROCEPHIN) 1 g in sodium chloride 0.9 % 100 mL IVPB (0 g Intravenous Stopped 09/12/21 2150)    ED Course/ Medical Decision Making/ A&P                           Medical Decision Making Amount and/or Complexity of Data Reviewed Labs: ordered. Radiology: ordered.  Risk OTC drugs. Prescription drug management. Decision regarding hospitalization.   Patient is a 88 year old male who presents with shortness of breath.  He was noted to be hypoxic with oxygen saturations in the 70s on his baseline 2 L/min of oxygen.  He required 4 to 5 L/min in the ED to maintain normal oxygen saturations.  He is a little tachypneic.  He gets short of breath with minimal exertion and his oxygen saturations are dropping with minimal movement in the ED.  Chest x-ray was performed which showed a possible right pleural effusion versus infiltrate.  This was interpreted by me and confirmed by the radiologist.  Follow-up CT scan confirmed these findings.  There is no evidence of PE.  He was started on IV antibiotics for possible infiltrate.  His BNP is elevated and he has some increased leg swelling from his baseline.  This would indicate that he likely has some degree of heart failure.  He was given a dose of Lasix in the ED.  Given the unilateral increase in swelling in his right leg from his baseline, Doppler ultrasounds performed which shows no evidence of DVT.  His troponins are mildly elevated but  likely from demand ischemia.  They appear to be flat.  His COVID/flu test is negative.  I discussed the case with  Dr. Hal Hope who will admit the patient for further treatment.  Final Clinical Impression(s) / ED Diagnoses Final diagnoses:  Shortness of breath  Pleural effusion  Hypoxia    Rx / DC Orders ED Discharge Orders     None         Malvin Johns, MD 09/12/21 9166    Malvin Johns, MD 09/12/21 2248

## 2021-09-12 NOTE — ED Triage Notes (Signed)
Son reports recently issues with anxiety and panic attacks.  Went to the [place] for mental health eval, but pt was hypoxia.  [facility].  Pts o2 was high 70s on RA in triage.  Baseline o2 2LNC

## 2021-09-12 NOTE — ED Notes (Signed)
Report given to [facility] 

## 2021-09-12 NOTE — ED Notes (Signed)
Attempted to call report. RN unavailable 

## 2021-09-12 NOTE — ED Notes (Signed)
RT assessed patient in triage. When I arrived, SAT 74%. Wears 2L at home, but 02 concentrator had died. Placed on 6L initially and weaned to 4L.

## 2021-09-12 NOTE — ED Notes (Signed)
Family at bedside. 

## 2021-09-12 NOTE — ED Notes (Signed)
Report given to [facility] RN

## 2021-09-13 ENCOUNTER — Inpatient Hospital Stay (HOSPITAL_COMMUNITY): Payer: No Typology Code available for payment source

## 2021-09-13 DIAGNOSIS — J90 Pleural effusion, not elsewhere classified: Secondary | ICD-10-CM

## 2021-09-13 DIAGNOSIS — J9621 Acute and chronic respiratory failure with hypoxia: Secondary | ICD-10-CM

## 2021-09-13 DIAGNOSIS — N1831 Chronic kidney disease, stage 3a: Secondary | ICD-10-CM

## 2021-09-13 DIAGNOSIS — R0609 Other forms of dyspnea: Secondary | ICD-10-CM

## 2021-09-13 DIAGNOSIS — I509 Heart failure, unspecified: Secondary | ICD-10-CM

## 2021-09-13 DIAGNOSIS — F0150 Vascular dementia without behavioral disturbance: Secondary | ICD-10-CM

## 2021-09-13 DIAGNOSIS — I10 Essential (primary) hypertension: Secondary | ICD-10-CM

## 2021-09-13 DIAGNOSIS — F410 Panic disorder [episodic paroxysmal anxiety] without agoraphobia: Secondary | ICD-10-CM

## 2021-09-13 DIAGNOSIS — J439 Emphysema, unspecified: Secondary | ICD-10-CM

## 2021-09-13 DIAGNOSIS — I441 Atrioventricular block, second degree: Secondary | ICD-10-CM

## 2021-09-13 LAB — CBC WITH DIFFERENTIAL/PLATELET
Abs Immature Granulocytes: 0.01 10*3/uL (ref 0.00–0.07)
Basophils Absolute: 0.1 10*3/uL (ref 0.0–0.1)
Basophils Relative: 1 %
Eosinophils Absolute: 0.2 10*3/uL (ref 0.0–0.5)
Eosinophils Relative: 3 %
HCT: 31.7 % — ABNORMAL LOW (ref 39.0–52.0)
Hemoglobin: 10.1 g/dL — ABNORMAL LOW (ref 13.0–17.0)
Immature Granulocytes: 0 %
Lymphocytes Relative: 22 %
Lymphs Abs: 1.3 10*3/uL (ref 0.7–4.0)
MCH: 28.3 pg (ref 26.0–34.0)
MCHC: 31.9 g/dL (ref 30.0–36.0)
MCV: 88.8 fL (ref 80.0–100.0)
Monocytes Absolute: 0.7 10*3/uL (ref 0.1–1.0)
Monocytes Relative: 11 %
Neutro Abs: 3.6 10*3/uL (ref 1.7–7.7)
Neutrophils Relative %: 63 %
Platelets: 161 10*3/uL (ref 150–400)
RBC: 3.57 MIL/uL — ABNORMAL LOW (ref 4.22–5.81)
RDW: 14.6 % (ref 11.5–15.5)
WBC: 5.7 10*3/uL (ref 4.0–10.5)
nRBC: 0 % (ref 0.0–0.2)

## 2021-09-13 LAB — ECHOCARDIOGRAM COMPLETE
AR max vel: 2.21 cm2
AV Peak grad: 8.5 mmHg
Ao pk vel: 1.46 m/s
Area-P 1/2: 3.39 cm2
Height: 71 in
S' Lateral: 2.6 cm
Weight: 2493.84 oz

## 2021-09-13 LAB — COMPREHENSIVE METABOLIC PANEL
ALT: 15 U/L (ref 0–44)
AST: 15 U/L (ref 15–41)
Albumin: 3.7 g/dL (ref 3.5–5.0)
Alkaline Phosphatase: 56 U/L (ref 38–126)
Anion gap: 9 (ref 5–15)
BUN: 17 mg/dL (ref 8–23)
CO2: 27 mmol/L (ref 22–32)
Calcium: 9 mg/dL (ref 8.9–10.3)
Chloride: 103 mmol/L (ref 98–111)
Creatinine, Ser: 1.42 mg/dL — ABNORMAL HIGH (ref 0.61–1.24)
GFR, Estimated: 48 mL/min — ABNORMAL LOW (ref >60)
Glucose, Bld: 173 mg/dL — ABNORMAL HIGH (ref 70–99)
Potassium: 3.6 mmol/L (ref 3.5–5.1)
Sodium: 139 mmol/L (ref 135–145)
Total Bilirubin: 0.7 mg/dL (ref 0.3–1.2)
Total Protein: 6.5 g/dL (ref 6.5–8.1)

## 2021-09-13 LAB — MAGNESIUM: Magnesium: 1.7 mg/dL (ref 1.7–2.4)

## 2021-09-13 MED ORDER — ALBUTEROL SULFATE (2.5 MG/3ML) 0.083% IN NEBU: 2.5000 mg | INHALATION_SOLUTION | RESPIRATORY_TRACT | Status: DC | PRN

## 2021-09-13 MED ORDER — POTASSIUM CHLORIDE CRYS ER 20 MEQ PO TBCR
40.0000 meq | EXTENDED_RELEASE_TABLET | Freq: Once | ORAL | Status: AC
Start: 2021-09-13 — End: 2021-09-13
  Administered 2021-09-13: 40 meq via ORAL
  Filled 2021-09-13: qty 2

## 2021-09-13 MED ORDER — FUROSEMIDE 40 MG PO TABS
40.0000 mg | ORAL_TABLET | Freq: Every day | ORAL | Status: DC
  Administered 2021-09-14: 40 mg via ORAL
  Filled 2021-09-13: qty 1

## 2021-09-13 MED ORDER — SIMVASTATIN 20 MG PO TABS
40.0000 mg | ORAL_TABLET | Freq: Every day | ORAL | Status: DC
  Administered 2021-09-13 – 2021-09-14 (×2): 40 mg via ORAL
  Filled 2021-09-13 (×2): qty 2

## 2021-09-13 MED ORDER — MIRTAZAPINE 15 MG PO TABS
15.0000 mg | ORAL_TABLET | Freq: Every day | ORAL | Status: DC
  Administered 2021-09-13 (×2): 15 mg via ORAL
  Filled 2021-09-13 (×3): qty 1

## 2021-09-13 MED ORDER — EMPAGLIFLOZIN 10 MG PO TABS
10.0000 mg | ORAL_TABLET | Freq: Every day | ORAL | Status: DC
  Administered 2021-09-13 – 2021-09-14 (×2): 10 mg via ORAL
  Filled 2021-09-13 (×2): qty 1

## 2021-09-13 MED ORDER — ACETAMINOPHEN 650 MG RE SUPP: 650.0000 mg | Freq: Four times a day (QID) | RECTAL | Status: DC | PRN

## 2021-09-13 MED ORDER — FUROSEMIDE 10 MG/ML IJ SOLN
40.0000 mg | Freq: Once | INTRAMUSCULAR | Status: AC
  Administered 2021-09-13: 40 mg via INTRAVENOUS
  Filled 2021-09-13: qty 4

## 2021-09-13 MED ORDER — ASPIRIN 81 MG PO CHEW
81.0000 mg | CHEWABLE_TABLET | Freq: Every day | ORAL | Status: DC
  Administered 2021-09-13 – 2021-09-14 (×2): 81 mg via ORAL
  Filled 2021-09-13 (×2): qty 1

## 2021-09-13 MED ORDER — ONDANSETRON HCL 4 MG PO TABS: 4.0000 mg | ORAL_TABLET | Freq: Four times a day (QID) | ORAL | Status: DC | PRN

## 2021-09-13 MED ORDER — ACETAMINOPHEN 325 MG PO TABS: 650.0000 mg | ORAL_TABLET | Freq: Four times a day (QID) | ORAL | Status: DC | PRN

## 2021-09-13 MED ORDER — UMECLIDINIUM-VILANTEROL 62.5-25 MCG/ACT IN AEPB
1.0000 | INHALATION_SPRAY | Freq: Every day | RESPIRATORY_TRACT | Status: DC
  Administered 2021-09-13 – 2021-09-14 (×2): 1 via RESPIRATORY_TRACT
  Filled 2021-09-13: qty 14

## 2021-09-13 MED ORDER — MAGNESIUM SULFATE 2 GM/50ML IV SOLN
2.0000 g | Freq: Once | INTRAVENOUS | Status: AC
  Administered 2021-09-13: 2 g via INTRAVENOUS
  Filled 2021-09-13: qty 50

## 2021-09-13 MED ORDER — HEPARIN SODIUM (PORCINE) 5000 UNIT/ML IJ SOLN
5000.0000 [IU] | Freq: Three times a day (TID) | INTRAMUSCULAR | Status: DC
  Administered 2021-09-13 – 2021-09-14 (×4): 5000 [IU] via SUBCUTANEOUS
  Filled 2021-09-13 (×4): qty 1

## 2021-09-13 MED ORDER — METOPROLOL SUCCINATE ER 25 MG PO TB24
12.5000 mg | ORAL_TABLET | Freq: Every day | ORAL | Status: DC
  Administered 2021-09-13: 12.5 mg via ORAL
  Filled 2021-09-13: qty 1

## 2021-09-13 MED ORDER — ONDANSETRON HCL 4 MG/2ML IJ SOLN: 4.0000 mg | Freq: Four times a day (QID) | INTRAMUSCULAR | Status: DC | PRN

## 2021-09-13 NOTE — Assessment & Plan Note (Addendum)
Plan to resume ace inh at the time of his discharge. Continue diuresis.

## 2021-09-13 NOTE — Assessment & Plan Note (Signed)
He has a small right-sided pleural effusion.  It is somewhat larger compared to his CT scan in May 2023 but given his likely limited pulmonary reserve, and may be enough to cause hypoxia.  It is however quite small and thoracentesis risk versus benefit ratio may not be in the patient's best interest.

## 2021-09-13 NOTE — Assessment & Plan Note (Signed)
Type 1 second degree AV block. Worse bradycardia when sleeping.   Plan to discontinue metoprolol and continue telemetry monitoring.

## 2021-09-13 NOTE — Assessment & Plan Note (Addendum)
Echocardiogram with preserved LV systolic function, LV EF 60 to 65%, with severe asymmetric left ventricular hypertrophy of the septal segment. RV systolic function is preserved. RVSP 37,9 Mild mitral valve prolapse.   Acute on chronic core pulmonale.   Urine output is 5000  Blood pressure systolic is 162 to 446 mmHg.   Plan to contin diuresis with furosemide will transition to furosemide 40 mg daily.  Continue blood pressure control and add spironolactone.   Acute on chronic hypoxemic respiratory failure, bilateral pleural effusion, more right than left.

## 2021-09-13 NOTE — Assessment & Plan Note (Signed)
Patient trying to get plugged into the [place] system for mental health.  That is why they were at the [place] today.

## 2021-09-13 NOTE — Hospital Course (Signed)
Brian [name] was admitted to the hospital with the working diagnosis of decompensated heart failure.  88 yo male with the past medical history of T2DM, chronic hypoxemic respiratory failure, hypertension, CKD stage 3 a and lung cancer sp radiation therapy who presented with dyspnea and hypoxemia. Reported worsening dyspnea despite supplemental 02 per [place]. On the day of hospitalization he was found to have a 02 saturation of 77% and was referred to the ED. On his initial physical examination his blood pressure was 189/79, HR 66, RR 32 and 02 saturation 93%, lungs with no wheezing or rhonchi, heart with S1 and S2 present with extra beats, abdomen soft and not distended, trace left lower extremity edema.  Na 143, K 4,0 Cl 103 bicarbonate 27, glucose 110 bun 20 and cr 1,29  BNP 690  High sensitive troponin 45 and 43  Wbc 4,7 hgb 10,9 plt 193   [place] lower extremities negative for DVT   Chest radiograph with hyperinflation and right pleural effusion, small to moderate.   CT chest with no evidence of pulmonary embolism. Right pleural effusion, diffuse emphysema.   EKG 62 bpm, left axis deviation, right bundle branch block, sinus rhythm with second degree type 1, no significant ST segment or T wave changes.

## 2021-09-13 NOTE — Assessment & Plan Note (Addendum)
Hypomagnesemia.   Patient tolerated diuresis well his discharge serum cr is 1,70 with K at 4,1 and serum bicarbonate at 29.  Patient had supplemental Mg and K with good toleration. His base serum cr is 1,5, Follow up renal function as outpatient.

## 2021-09-13 NOTE — Progress Notes (Signed)
Mobility Specialist Progress Note    09/13/21 1511  Mobility  Activity Ambulated with assistance in hallway  Level of Assistance Contact guard assist, steadying assist  Assistive Device Front wheel walker  Distance Ambulated (ft) 260 ft  Activity Response Tolerated well  $Mobility charge 1 Mobility   Pre-Mobility: 50 HR, 171/61 BP, 99% SpO2 Post-Mobility: 75 HR, 100% SpO2  Pt received in bed and agreeable.  On 4LO2, but could not get a reliable pleth. No complaints on walk. Returned to sitting EOB with call bell in reach.    Hildred Alamin Mobility Specialist

## 2021-09-13 NOTE — Progress Notes (Signed)
Progress Note   Patient: Brian [name] OAC:[id number] DOB: 03/24/1931 DOA: 09/12/2021     1 DOS: the patient was seen and examined on 09/13/2021   Brief hospital course: Brian [name] was admitted to the hospital with the working diagnosis of decompensated heart failure.  88 yo male with the past medical history of T2DM, chronic hypoxemic respiratory failure, hypertension, CKD stage 3 a and lung cancer sp radiation therapy who presented with dyspnea and hypoxemia. Reported worsening dyspnea despite supplemental 02 per [place]. On the day of hospitalization he was found to have a 02 saturation of 77% and was referred to the ED. On his initial physical examination his blood pressure was 189/79, HR 66, RR 32 and 02 saturation 93%, lungs with no wheezing or rhonchi, heart with S1 and S2 present with extra beats, abdomen soft and not distended, trace left lower extremity edema.  Na 143, K 4,0 Cl 103 bicarbonate 27, glucose 110 bun 20 and cr 1,29  BNP 690  High sensitive troponin 45 and 43  Wbc 4,7 hgb 10,9 plt 193   [place] lower extremities negative for DVT   Chest radiograph with hyperinflation and right pleural effusion, small to moderate.   CT chest with no evidence of pulmonary embolism. Right pleural effusion, diffuse emphysema.   EKG 62 bpm, left axis deviation, right bundle branch block, sinus rhythm with second degree type 1, no significant ST segment or T wave changes.      Assessment and Plan: * Acute CHF (congestive heart failure) (HCC) Echocardiogram with preserved LV systolic function, LV EF 60 to 65%, with severe asymmetric left ventricular hypertrophy of the septal segment. RV systolic function is preserved. RVSP 37,9 Mild mitral valve prolapse.   Acute on chronic core pulmonale.   Urine output is 5000  Blood pressure systolic is 010 to 932 mmHg.   Plan to contin diuresis with furosemide will transition to furosemide 40 mg daily.  Continue blood pressure control and add  spironolactone.   Acute on chronic hypoxemic respiratory failure, bilateral pleural effusion, more right than left.      COPD (chronic obstructive pulmonary disease) (HCC) Chronic.  Not currently exacerbated.  Stage 3a chronic kidney disease (CKD) ([place]) Renal function with serum cr at 1,42 with K at 3,6 and serum bicarbonate at 27. Plan to continue diuresis with furosemide and follow up renal function and electrolytes in am. Add SGLT 2 inh   Panic attacks Patient trying to get plugged into the [place] system for mental health.  That is why they were at the [place] today.  Hypertension Plan to resume ace inh at the time of his discharge. Continue diuresis.   Heart block AV second degree Type 1 second degree AV block. Worse bradycardia when sleeping.   Plan to discontinue metoprolol and continue telemetry monitoring.         Subjective: Patient with no chest pain or dyspnea, edema has improved.   Physical Exam: Vitals:   09/13/21 0920 09/13/21 1217 09/13/21 1316 09/13/21 1643  BP:  (!) 156/69    Pulse:  67 67   Resp:  (!) 24 14   Temp:  98.1 F (36.7 C)    TempSrc:  Oral    SpO2: 95% 90% 97% 92%  Weight:      Height:       Neurology awake and alert ENT with mild pallor Cardiovascular with S1 and S2 present irregular with no gallops Respiratory with rales at bases and decreased breath sounds at the  right base Abdomen with no distention No lower extremity edema  Data Reviewed:    Family Communication: I spoke with patient's son at the bedside, we talked in detail about patient's condition, plan of care and prognosis and all questions were addressed.   Disposition: Status is: Inpatient Remains inpatient appropriate because: heart failure and respiratory failure   Planned Discharge Destination: Home      Author: Tawni Millers, MD 09/13/2021 5:02 PM  For on call review [email or web address].

## 2021-09-13 NOTE — H&P (Addendum)
History and Physical    ACY ORSAK DQQ:[id number] DOB: [birth year] DOA: 09/12/2021  DOS: the patient was seen and examined on 09/12/2021  PCP: Binnie Rail, MD   Patient coming from: Home  I have personally briefly reviewed patient's old medical records in [place]  Chief complaint: Shortness of breath, hypoxia History of present illness: 88 year old male history of type 2 diabetes, chronic hypoxic respite failure on 2 L of oxygen, panic attacks, history of lung cancer status post XRT, hypertension, CKD stage IIIa, presents to the ER after being seen in the [place] center today for mental health issues.  Patient normally is on 2 L of oxygen continuously.  He has never been told he needs to turn his oxygen up with activity but he has been consistently short of breath with any sort of movement at all.  This makes him extremely anxious.  This causes him to become tachypneic which worsens his feeling of dyspnea.  He becomes hypoxic at that time.  Patient seen today in clinic.  His room air saturations were 77%.  Oxygen turned up to 4 L.  Work-up in the ER included a CTPA which was negative for PE.  It did show a small to moderate right-sided pleural effusion.  White count was normal.  He has no fevers.  He has not been coughing.  Patient transferred to [facility] for further care.   ED Course: CTPA negative for PE.  Showed small/moderate right pleural effusion.  Review of Systems:  Review of Systems  Constitutional: Negative.  Negative for chills, fever, malaise/fatigue and weight loss.  HENT: Negative.    Eyes: Negative.   Respiratory:         Dyspnea on exertion  Cardiovascular:  Positive for leg swelling.  Gastrointestinal: Negative.   Genitourinary: Negative.   Musculoskeletal: Negative.   Skin: Negative.   Neurological: Negative.   Endo/Heme/Allergies: Negative.   Psychiatric/Behavioral:  The patient is nervous/anxious.        Frequent panic attacks  All other  systems reviewed and are negative.   Past Medical History:  Diagnosis Date   AAA (abdominal aortic aneurysm) ([place])    Arthritis    Asthma 1976   Bronchiolitis 06/2014   CAD (coronary artery disease)    NSTEMI 02/2010;  s/p CABG 1/12 (L-LAD, SOM1, S-PDA); echo in 08/2010: Mild LVH, EF 19%, grade 2 diastolic dysfunction, mild MR, mild LAE, mildly reduced RV function, mild RAE, PASP 42   Carpal tunnel syndrome of left wrist    Colon polyps    COPD (chronic obstructive pulmonary disease) (HCC)    Diabetes mellitus 2000   Type II, controlls with diet   Dyspnea    ED (erectile dysfunction)    Gout    History of radiation therapy 03/09/2020-03/16/2020   SBRT right lung: Dr. Gery Pray   HTN (hypertension)    Hyperlipidemia    Inguinal hernia    Right   Macular degeneration    Myocardial infarction [facility]) 1 -13-2012   Neuropathy     Past Surgical History:  Procedure Laterality Date   3 vessel CABG  03/03/2010   ABDOMINAL AORTIC ENDOVASCULAR STENT GRAFT N/A 01/21/2019   Procedure: ABDOMINAL AORTIC ENDOVASCULAR STENT GRAFT;  Surgeon: Waynetta Sandy, MD;  Location: [place];  Service: Vascular;  Laterality: N/A;   cataract left eye     COLONOSCOPY     neg 2009   COLONOSCOPY  2014   5 mm sessile polyp; AVM  CORONARY ARTERY BYPASS GRAFT  2012   X3   ENDOVASCULAR REPAIR/STENT GRAFT  01/21/2019   ABDOMINAL AORTIC ENDOVASCULAR STENT GRAFT (N/A )   EYE SURGERY     Left cataract   HERNIA REPAIR     INCISION AND DRAINAGE PERIRECTAL ABSCESS     INGUINAL HERNIA REPAIR       reports that he quit smoking about 18 years ago. His smoking use included cigarettes. He has a 40.00 pack-year smoking history. He has never used smokeless tobacco. He reports that he does not currently use alcohol after a past usage of about 6.0 standard drinks of alcohol per week. He reports that he does not use drugs.  Allergies  Allergen Reactions   Meperidine Hcl     Tongue swelling Because of a  history of documented adverse serious drug reaction;Medi Alert bracelet  is recommended   Sertraline Other (See Comments)    Burning sensation from feet to head, diff breathing   Gabapentin     confusion   Meperidine     Other reaction(s): Angioedema of tongue   Zolpidem     Other reaction(s): Nightmares   Zolpidem Tartrate Other (See Comments)    Nightmares    Family History  Problem Relation Age of Onset   Heart failure Mother    Heart disease Mother        CHF   Heart attack Mother    Hypertension Mother    Heart attack Father    Heart disease Father 33       MI   Hypertension Father    Heart attack Sister 3   Heart disease Sister 9       MI   Diabetes Sister    Hyperlipidemia Sister    Hypertension Sister    Varicose Veins Sister    Heart attack Brother 67   Diabetes Brother    Heart disease Brother 32       MI   Cancer Brother    Hypertension Brother    Deep vein thrombosis Daughter    Leukemia Daughter    Other Son        varicose veins   Arthritis/Rheumatoid Son    Lupus Daughter    Colon cancer Neg Hx     Prior to Admission medications   Medication Sig Start Date End Date Taking? Authorizing Provider  ACCU-CHEK COMPACT STRIPS test strip TEST BLOOD SUGAR ONCE DAILY 08/18/11   Hendricks Limes, MD  albuterol (VENTOLIN HFA) 108 (90 Base) MCG/ACT inhaler INHALE 2 PUFFS BY MOUTH FOUR TIMES A DAY 04/22/20   [provider]  Celedonio Miyamoto 62.5-25 MCG/ACT AEPB USE 1 INHALATION DAILY 04/18/21   Binnie Rail, MD  Aspirin 81 MG EC tablet Take 81 mg by mouth daily.    [provider]  cholecalciferol (VITAMIN D) 1000 UNITS tablet Take 3,000 Units by mouth daily.     [provider]  enalapril (VASOTEC) 10 MG tablet TAKE 1 TABLET TWICE A DAY 08/28/21   Binnie Rail, MD  Magnesium Oxide 420 MG TABS Take 420 mg by mouth daily.     [provider]  metoprolol succinate (TOPROL-XL) 25 MG 24 hr tablet TAKE ONE-HALF (1/2) TABLET DAILY  04/18/21   Binnie Rail, MD  mirtazapine (REMERON) 15 MG tablet Take 1 tablet (15 mg total) by mouth at bedtime. 06/01/21   Binnie Rail, MD  Multiple Vitamins-Minerals (MULTIVITAL PO) Take 1 tablet by mouth daily.  [provider]  Polyethyl Glycol-Propyl Glycol (SYSTANE) 0.4-0.3 % SOLN Place 1 drop into both eyes daily.    [provider]  simvastatin (ZOCOR) 40 MG tablet TAKE 1 TABLET DAILY 04/18/21   Binnie Rail, MD  temazepam (RESTORIL) 7.5 MG capsule Take 1-2 capsules (7.5-15 mg total) by mouth at bedtime as needed for sleep. 06/01/21   Binnie Rail, MD  triamcinolone cream (KENALOG) 0.1 % Apply 1 application topically daily as needed (Rash).    [provider]    Physical Exam: Vitals:   09/12/21 1900 09/12/21 1915 09/12/21 1945 09/12/21 2308  BP:  (!) 189/79 (!) 196/83 (!) 186/87  Pulse:   69 66  Resp: (!) 34 (!) 32 (!) 29 20  Temp:    98.6 F (37 C)  TempSrc:    Oral  SpO2:   93% 97%  Weight:      Height:        Physical Exam Vitals and nursing note reviewed.  Constitutional:      General: He is not in acute distress.    Appearance: Normal appearance. He is not ill-appearing, toxic-appearing or diaphoretic.  HENT:     Head: Normocephalic and atraumatic.     Nose: Nose normal. No rhinorrhea.  Eyes:     General: No scleral icterus. Cardiovascular:     Rate and Rhythm: Normal rate and regular rhythm. Occasional Extrasystoles are present.    Pulses: Normal pulses.     Heart sounds: Normal heart sounds.  Pulmonary:     Effort: Pulmonary effort is normal. No respiratory distress.     Breath sounds: No wheezing or rhonchi.  Abdominal:     General: Abdomen is flat. Bowel sounds are normal. There is no distension.     Tenderness: There is no abdominal tenderness. There is no guarding.  Musculoskeletal:     Comments: Trace left pretibial edema No edema right lower leg  Skin:    General: Skin is warm and dry.     Capillary Refill:  Capillary refill takes less than 2 seconds.  Neurological:     General: No focal deficit present.     Mental Status: He is alert and oriented to person, place, and time.      Labs on Admission: I have personally reviewed following labs and imaging studies  CBC: Recent Labs  Lab 09/12/21 1509 09/12/21 1519  WBC 4.7  --   HGB 10.9* 12.6*  HCT 35.6* 37.0*  MCV 91.3  --   PLT 193  --    Basic Metabolic Panel: Recent Labs  Lab 09/12/21 1509 09/12/21 1519  NA 143 142  K 4.0 3.9  CL 103  --   CO2 27  --   GLUCOSE 110*  --   BUN 20  --   CREATININE 1.29*  --   CALCIUM 9.8  --    GFR: Estimated Creatinine Clearance: 40.3 mL/min (A) (by C-G formula based on SCr of 1.29 mg/dL (H)). Liver Function Tests: Recent Labs  Lab 09/12/21 1509  AST 15  ALT 15  ALKPHOS 62  BILITOT 0.7  PROT 7.7  ALBUMIN 4.7   No results for input(s): "LIPASE", "AMYLASE" in the last 168 hours. No results for input(s): "AMMONIA" in the last 168 hours. Coagulation Profile: No results for input(s): "INR", "PROTIME" in the last 168 hours. Cardiac Enzymes: Recent Labs  Lab 09/12/21 1509 09/12/21 1640  TROPONINIHS 45* 43*   BNP (last 3 results) No results for input(s): "PROBNP"  in the last 8760 hours. HbA1C: No results for input(s): "HGBA1C" in the last 72 hours. CBG: Recent Labs  Lab 09/12/21 2205  GLUCAP 115*   Lipid Profile: No results for input(s): "CHOL", "HDL", "LDLCALC", "TRIG", "CHOLHDL", "LDLDIRECT" in the last 72 hours. Thyroid Function Tests: No results for input(s): "TSH", "T4TOTAL", "FREET4", "T3FREE", "THYROIDAB" in the last 72 hours. Anemia Panel: No results for input(s): "VITAMINB12", "FOLATE", "FERRITIN", "TIBC", "IRON", "RETICCTPCT" in the last 72 hours. Urine analysis:    Component Value Date/Time   COLORURINE YELLOW 01/19/2019 1030   APPEARANCEUR CLEAR 01/19/2019 1030   LABSPEC 1.013 01/19/2019 1030   PHURINE 7.0 01/19/2019 1030   GLUCOSEU NEGATIVE 01/19/2019  1030   HGBUR NEGATIVE 01/19/2019 1030   BILIRUBINUR NEGATIVE 01/19/2019 1030   [place] 01/19/2019 1030   PROTEINUR NEGATIVE 01/19/2019 1030   UROBILINOGEN 1.0 03/02/2010 2320   NITRITE NEGATIVE 01/19/2019 1030   LEUKOCYTESUR NEGATIVE 01/19/2019 1030    Radiological Exams on Admission: I have personally reviewed images CT Angio Chest PE W/Cm &/Or Wo Cm  Result Date: 09/12/2021 CLINICAL DATA:  Chest pain or shortness of breath with pleurisy or effusion suspected. EXAM: CT ANGIOGRAPHY CHEST WITH CONTRAST TECHNIQUE: Multidetector CT imaging of the chest was performed using the standard protocol during bolus administration of intravenous contrast. Multiplanar CT image reconstructions and MIPs were obtained to evaluate the vascular anatomy. RADIATION DOSE REDUCTION: This exam was performed according to the departmental dose-optimization program which includes automated exposure control, adjustment of the mA and/or kV according to patient size and/or use of iterative reconstruction technique. CONTRAST:  83mL OMNIPAQUE IOHEXOL 350 MG/ML SOLN COMPARISON:  07/10/2021 FINDINGS: Cardiovascular: Images somewhat laid after contrast bolus administration but there is moderately good opacification of the central and segmental pulmonary arteries. No focal filling defects are identified. No evidence of significant pulmonary embolus. Central pulmonary arteries are mildly dilated which may provide evidence of pulmonary arterial hypertension. Mild cardiac enlargement. No pericardial effusions. Normal caliber thoracic aorta. No aortic dissection. Calcification of the aorta and coronary arteries. Postoperative changes consistent with coronary bypass. Mediastinum/Nodes: Thyroid gland is unremarkable. No significant lymphadenopathy. Esophagus is decompressed. Lungs/Pleura: Small right pleural effusion with mild basilar atelectasis or consolidation. This is likely compressive atelectasis but could indicate pneumonia.  Mild atelectasis in the left base. Diffuse emphysematous changes throughout the lungs. No pneumothorax. Upper Abdomen: No acute abnormalities. Musculoskeletal: Degenerative changes. Sternotomy wires. No acute bony abnormalities. Review of the MIP images confirms the above findings. IMPRESSION: 1. No evidence of significant pulmonary embolus. 2. Dilated central pulmonary arteries may indicate pulmonary arterial hypertension. 3. Right pleural effusion with atelectasis or infiltration in the right lung base. Mild atelectasis in the left base. 4. Aortic atherosclerosis. 5. Diffuse emphysematous changes in the lungs. Electronically Signed   By: Lucienne Capers M.D.   On: 09/12/2021 19:04   US Venous Img Lower Bilateral  Result Date: 09/12/2021 CLINICAL DATA:  Bilateral leg edema EXAM: Bilateral LOWER EXTREMITY VENOUS DOPPLER ULTRASOUND TECHNIQUE: Gray-scale sonography with compression, as well as color and duplex ultrasound, were performed to evaluate the deep venous system(s) from the level of the common femoral vein through the popliteal and proximal calf veins. COMPARISON:  None Available. FINDINGS: VENOUS Normal compressibility of the common femoral, superficial femoral, and popliteal veins, as well as the visualized calf veins. Visualized portions of profunda femoral vein and great saphenous vein unremarkable. No filling defects to suggest DVT on grayscale or color Doppler imaging. Doppler waveforms show normal direction of venous flow, normal  respiratory plasticity and response to augmentation. OTHER None. Limitations: none IMPRESSION: Negative. Electronically Signed   By: Donavan Foil M.D.   On: 09/12/2021 16:22   DG Chest 2 View  Result Date: 09/12/2021 CLINICAL DATA:  An 88 year old male presents for evaluation of shortness of breath. EXAM: CHEST - 2 VIEW COMPARISON:  May 26, 2020 and Jul 11, 2021 chest radiograph and CT respectively. FINDINGS: Opacity in the RIGHT mid chest corresponding the  patient's known treated lung cancer is similar to the previous chest CT scout image from May of 2023. Post median sternotomy for CABG with stable mediastinal contours mild cardiac enlargement and mild fullness of the RIGHT and LEFT hilum. Lungs are hyperinflated. Increased blunting of RIGHT costodiaphragmatic sulcus. No pneumothorax. On limited assessment there is no acute skeletal finding. IMPRESSION: Increased blunting of the RIGHT costodiaphragmatic sulcus. Finding could reflect worsening of pleural fluid and or airspace disease. Signs of COPD with evidence of prior treated lung cancer not substantially changed from prior imaging. Post median sternotomy for CABG. Electronically Signed   By: Zetta Bills M.D.   On: 09/12/2021 15:01    EKG: My personal interpretation of EKG shows: sinus bradycardia    Assessment/Plan Principal Problem:   Acute on chronic respiratory failure with hypoxia (HCC) - 2 L/min continuously Active Problems:   Acute CHF (congestive heart failure) (HCC)   Pleural effusion on right   Hypertension   COPD (chronic obstructive pulmonary disease) (HCC)   Stage 3a chronic kidney disease (CKD) (HCC)   Panic attacks    Assessment and Plan: * Acute on chronic respiratory failure with hypoxia (HCC) - 2 L/min continuously Observation medical telemetry bed.  Patient has very poor pulmonary reserve given his prior history of lung cancer status post XRT and COPD.  Patient is only using 2 L continuous with both activity and rest.  Patient has dyspnea exertion.  Sounds like he probably needs to be on oxygen at 4 L a minute with activity and 2 L at rest.  CTPA reviewed.  Does not have pneumonia.  He has a small right pleural effusion.  However given his very poor pulmonary status, this effusion may be enough to cause additional hypoxia.  Not sure that thoracentesis is the best option for him.  Continue with diuresis.  Pleural effusion on right He has a small right-sided pleural  effusion.  It is somewhat larger compared to his CT scan in May 2023 but given his likely limited pulmonary reserve, and may be enough to cause hypoxia.  It is however quite small and thoracentesis risk versus benefit ratio may not be in the patient's best interest.  Acute CHF (congestive heart failure) (HCC) BNP is elevated.  He has no prior history of congestive heart failure.  We will check an echo.  He did have some lower extremity edema on the right leg according to son but now it is resolved after 1 dose of Lasix.  Panic attacks Patient trying to get plugged into the [place] system for mental health.  That is why they were at the [place] today.  Stage 3a chronic kidney disease (CKD) (HCC) Chronic.  COPD (chronic obstructive pulmonary disease) (HCC) Chronic.  Not currently exacerbated.  Hypertension Stable. Continue toprol-xl. Hold ACEI during diuresis. Monitor sCr.   DVT prophylaxis: SQ Heparin Code Status: Full Code Family Communication: discussed with pt and son Tim at bedside  Disposition Plan: return home  Consults called: none  Admission status: Observation, Telemetry bed   Kristopher Oppenheim,  DO [facility] 09/13/2021, 12:33 AM

## 2021-09-13 NOTE — Assessment & Plan Note (Signed)
Chronic.  Not currently exacerbated.

## 2021-09-13 NOTE — Progress Notes (Signed)
Notified by central monitoring that patient's HR dipping down into the 30s. Patient assessed by charge RN, Alyse Low. Patient was found to be sleeping and HR returned to 70s when he was awake. Patient asymptomatic. Telemetry showing possible 2nd degree HB; strips saved by monitor tech. Dr. Cathlean Sauer made aware and will be rounding on patient shortly.

## 2021-09-13 NOTE — Assessment & Plan Note (Signed)
Observation medical telemetry bed.  Patient has very poor pulmonary reserve given his prior history of lung cancer status post XRT and COPD.  Patient is only using 2 L continuous with both activity and rest.  Patient has dyspnea exertion.  Sounds like he probably needs to be on oxygen at 4 L a minute with activity and 2 L at rest.  CTPA reviewed.  Does not have pneumonia.  He has a small right pleural effusion.  However given his very poor pulmonary status, this effusion may be enough to cause additional hypoxia.  Not sure that thoracentesis is the best option for him.  Continue with diuresis.

## 2021-09-13 NOTE — Subjective & Objective (Signed)
Chief complaint: Shortness of breath, hypoxia History of present illness: 88 year old male history of type 2 diabetes, chronic hypoxic respite failure on 2 L of oxygen, panic attacks, history of lung cancer status post XRT, hypertension, CKD stage IIIa, presents to the ER after being seen in the [place] center today for mental health issues.  Patient normally is on 2 L of oxygen continuously.  He has never been told he needs to turn his oxygen up with activity but he has been consistently short of breath with any sort of movement at all.  This makes him extremely anxious.  This causes him to become tachypneic which worsens his feeling of dyspnea.  He becomes hypoxic at that time.  Patient seen today in clinic.  His room air saturations were 77%.  Oxygen turned up to 4 L.  Work-up in the ER included a CTPA which was negative for PE.  It did show a small to moderate right-sided pleural effusion.  White count was normal.  He has no fevers.  He has not been coughing.  Patient transferred to [facility] for further care.

## 2021-09-14 ENCOUNTER — Other Ambulatory Visit (HOSPITAL_COMMUNITY): Payer: Self-pay

## 2021-09-14 ENCOUNTER — Telehealth (HOSPITAL_COMMUNITY): Payer: Self-pay | Admitting: Pharmacy Technician

## 2021-09-14 LAB — MAGNESIUM: Magnesium: 2.1 mg/dL (ref 1.7–2.4)

## 2021-09-14 LAB — BASIC METABOLIC PANEL
Anion gap: 8 (ref 5–15)
BUN: 23 mg/dL (ref 8–23)
CO2: 29 mmol/L (ref 22–32)
Calcium: 8.6 mg/dL — ABNORMAL LOW (ref 8.9–10.3)
Chloride: 102 mmol/L (ref 98–111)
Creatinine, Ser: 1.7 mg/dL — ABNORMAL HIGH (ref 0.61–1.24)
GFR, Estimated: 39 mL/min — ABNORMAL LOW (ref >60)
Glucose, Bld: 89 mg/dL (ref 70–99)
Potassium: 4.1 mmol/L (ref 3.5–5.1)
Sodium: 139 mmol/L (ref 135–145)

## 2021-09-14 MED ORDER — FUROSEMIDE 40 MG PO TABS
40.0000 mg | ORAL_TABLET | Freq: Every day | ORAL | 0 refills | Status: DC
Start: 2021-09-15 — End: 2021-12-27
  Filled 2021-09-14: qty 30, 30d supply, fill #0

## 2021-09-14 MED ORDER — EMPAGLIFLOZIN 10 MG PO TABS
10.0000 mg | ORAL_TABLET | Freq: Every day | ORAL | 0 refills | Status: AC
  Filled 2021-09-14: qty 30, 30d supply, fill #0

## 2021-09-14 NOTE — TOC Benefit Eligibility Note (Signed)
Patient Teacher, English as a foreign language completed.    The patient is currently admitted and upon discharge could be taking Jardiance 10 mg.  The current 30 day co-pay is $38.00.   The patient is currently admitted and upon discharge could be taking Farxiga 10 mg.  Prior Authorization Required  The patient is insured through [employer], [place] Patient Advocate Specialist [place] Patient Advocate Team Direct Number: [phone]  Fax: [phone]

## 2021-09-14 NOTE — Discharge Summary (Signed)
Physician Discharge Summary   Patient: Brian [name] MRN: [id number] DOB: 1931-04-25  Admit date:     09/12/2021  Discharge date: 09/14/21  Discharge Physician: Brian [name]   PCP: Brian Rail, MD   Recommendations at discharge:    Patient will continue supplemental 02 per [place] to keep 02 saturation 88% or greater, instructed to follow oxymetry at home and increase his 02 flow as needed.  Added furosemide and empagliflozin. Follow up renal function as outpatient Follow up chest radiograph for resolution of right pleural effusion.   I spoke with patient's son at the bedside, we talked in detail about patient's condition, plan of care and prognosis and all questions were addressed.   Discharge Diagnoses: Principal Problem:   Acute CHF (congestive heart failure) (HCC) Active Problems:   COPD (chronic obstructive pulmonary disease) (HCC)   Stage 3a chronic kidney disease (CKD) (HCC)   Panic attacks   Hypertension   Heart block AV second degree  Resolved Problems:   * No resolved hospital problems. [facility] Course: [name] was admitted to the hospital with the working diagnosis of decompensated heart failure.  88 yo male with the past medical history of T2DM, chronic hypoxemic respiratory failure, hypertension, CKD stage 3 a and lung cancer sp radiation therapy who presented with dyspnea and hypoxemia. Reported worsening dyspnea despite supplemental 02 per [place]. On the day of hospitalization he was found to have a 02 saturation of 77% and was referred to the ED. On his initial physical examination his blood pressure was 189/79, HR 66, RR 32 and 02 saturation 93%, lungs with no wheezing or rhonchi, bilateral rales with decreased breath sounds at the right base, heart with S1 and S2 present with extra beats, abdomen soft and not distended, trace left lower extremity edema.  Na 143, K 4,0 Cl 103 bicarbonate 27, glucose 110 bun 20 and cr 1,29  BNP 690  High sensitive  troponin 45 and 43  Wbc 4,7 hgb 10,9 plt 193   [place] lower extremities negative for DVT   Chest radiograph with hyperinflation and right pleural effusion, small to moderate.   CT chest with no evidence of pulmonary embolism. Right pleural effusion, diffuse emphysema.   EKG 62 bpm, left axis deviation, right bundle branch block, sinus rhythm with second degree type 1, no significant ST segment or T wave changes.    Patient was placed on furosemide for diuresis with improvement in his symptoms. Echocardiogram with preserved LV systolic function with positive septal hypertrophy.  Telemetry with bradycardia with 2 nd degree AV block type 1 that improved after discontinuation of metoprolol.  Patient will follow up as outpatient. Continue diuresis at home with oral furosemide.     Assessment and Plan: * Acute CHF (congestive heart failure) (HCC) Echocardiogram with preserved LV systolic function, LV EF 60 to 65%, with severe asymmetric left ventricular hypertrophy of the septal segment. RV systolic function is preserved. RVSP 37,9 Mild mitral valve prolapse.   Acute on chronic core pulmonale.   Patient was placed on IV furosemide for diuresis, negative fluid balance was achieved, -4,244 ml, with significant improvement in his symptoms.   Plan to  furosemide 40 mg daily along with SGLT 2inh.  Resume lisinopril for blood pressure control. Holding B blocker due to bradycardia.  Follow as outpatient.   Acute on chronic hypoxemic respiratory failure, bilateral pleural effusion, more right than left.  Cardiogenic pulmonary edema.  At the time of his discharge his oxygenation is  93% on 3 L./min per [place]. Recommend to follow up chest radiograph as outpatient for resolution of pleural effusion. If worsening may need thoracentesis for further work up.       COPD (chronic obstructive pulmonary disease) (HCC) Chronic.  Not currently exacerbated.  Stage 3a chronic kidney disease (CKD)  (HCC) Hypomagnesemia.   Patient tolerated diuresis well his discharge serum cr is 1,70 with K at 4,1 and serum bicarbonate at 29.  Patient had supplemental Mg and K with good toleration. His base serum cr is 1,5, Follow up renal function as outpatient.   Panic attacks Depression. Continue with mirtazapine and temazepam. Follow up with the [place].   Hypertension Plan to resume ace inh at the time of his discharge. Continue diuresis.   Heart block AV second degree Type 1 second degree AV block. Worse bradycardia when sleeping.   Metoprolol has been discontinued with improvement in bradycardia. Plan to follow up as outpatient.          Consultants: none  Procedures performed: none   Disposition: Home Diet recommendation:  Cardiac diet DISCHARGE MEDICATION: Allergies as of 09/14/2021       Reactions   Meperidine Hcl    Tongue swelling Because of a history of documented adverse serious drug reaction;Medi Alert bracelet  is recommended   Sertraline Other (See Comments)   Burning sensation from feet to head, diff breathing   Gabapentin    confusion   Meperidine    Other reaction(s): Angioedema of tongue   Zolpidem    Other reaction(s): Nightmares   Zolpidem Tartrate Other (See Comments)   Nightmares        Medication List     STOP taking these medications    metoprolol succinate 25 MG 24 hr tablet Commonly known as: TOPROL-XL       TAKE these medications    ACCU-CHEK COMPACT STRIPS test strip Generic drug: glucose blood TEST BLOOD SUGAR ONCE DAILY   acetaminophen 500 MG tablet Commonly known as: TYLENOL Take 500 mg by mouth every 6 (six) hours as needed for mild pain.   albuterol 108 (90 Base) MCG/ACT inhaler Commonly known as: VENTOLIN HFA Inhale 2 puffs into the lungs every 6 (six) hours as needed for wheezing or shortness of breath.   Anoro Ellipta 62.5-25 MCG/ACT Aepb Generic drug: umeclidinium-vilanterol USE 1 INHALATION DAILY What  changed: See the new instructions.   Aspirin 81 MG EC tablet Take 81 mg by mouth daily.   cholecalciferol 1000 units tablet Commonly known as: VITAMIN D Take 3,000 Units by mouth daily.   empagliflozin 10 MG Tabs tablet Commonly known as: JARDIANCE Take 1 tablet (10 mg total) by mouth daily. Start taking on: September 15, 2021   enalapril 10 MG tablet Commonly known as: VASOTEC TAKE 1 TABLET TWICE A DAY   furosemide 40 MG tablet Commonly known as: LASIX Take 1 tablet (40 mg total) by mouth daily. Start taking on: September 15, 2021   Magnesium Oxide 420 MG Tabs Take 420 mg by mouth daily.   mirtazapine 15 MG tablet Commonly known as: Remeron Take 1 tablet (15 mg total) by mouth at bedtime.   MULTIVITAL PO Take 1 tablet by mouth daily.   simvastatin 40 MG tablet Commonly known as: ZOCOR TAKE 1 TABLET DAILY   Systane 0.4-0.3 % Soln Generic drug: Polyethyl Glycol-Propyl Glycol Place 1 drop into both eyes daily.   temazepam 7.5 MG capsule Commonly known as: RESTORIL Take 1-2 capsules (7.5-15 mg total) by mouth at  bedtime as needed for sleep.   triamcinolone cream 0.1 % Commonly known as: KENALOG Apply 1 application topically daily as needed ([place]).        Discharge Exam: Filed Weights   09/12/21 1441 09/14/21 0500  Weight: 70.7 kg 67.3 kg   BP (!) 131/39 (BP Location: Left Arm)   Pulse 68   Temp 97.8 F (36.6 C) (Oral)   Resp 19   Ht 5\' 11"  (1.803 m)   Wt 67.3 kg   SpO2 93%   BMI 20.69 kg/m   Patient is feeling better, dyspnea and edema have improved.   Neurology awake and alert ENT with no pallor Cardiovascular with S1 and S2 present with no gallops or murmurs No JVD No lower extremity edema Respiratory with no rales or wheezing, mild decreased breath sounds at the right base. Abdomen not distended   Condition at discharge: stable  The results of significant diagnostics from this hospitalization (including imaging, microbiology, ancillary and  laboratory) are listed below for reference.   Imaging Studies: ECHOCARDIOGRAM COMPLETE  Result Date: 09/13/2021    ECHOCARDIOGRAM REPORT   Patient Name:   JANSON LAMAR Date of Exam: 09/13/2021 Medical Rec #:  [id number]       Height:       71.0 in Accession #:    [id number]      Weight:       155.9 lb Date of Birth:  [birth year]      BSA:          1.897 m Patient Age:    46 years        BP:           167/87 mmHg Patient Gender: M               HR:           72 bpm. Exam Location:  Inpatient Procedure: 2D Echo, Cardiac Doppler and Color Doppler Indications:    Dyspnea  History:        Patient has prior history of Echocardiogram examinations, most                 recent 05/12/2015. CHF, CAD, COPD; Risk Factors:Hypertension and                 Diabetes.  Sonographer:    Jefferey Pica Referring Phys: [place]  1. Left ventricular ejection fraction, by estimation, is 60 to 65%. The left ventricle has normal function. The left ventricle has no regional wall motion abnormalities. There is severe asymmetric left ventricular hypertrophy of the septal segment. Left  ventricular diastolic parameters are consistent with Grade I diastolic dysfunction (impaired relaxation).  2. Right ventricular systolic function is normal. The right ventricular size is mildly enlarged. There is mildly elevated pulmonary artery systolic pressure. The estimated right ventricular systolic pressure is 93.7 mmHg.  3. Right atrial size was mild to moderately dilated.  4. Mild bileaflet mitral valve prolapse, posterior > anterior. . The mitral valve is myxomatous. Trivial mitral valve regurgitation. No evidence of mitral stenosis.  5. The aortic valve is grossly normal. There is mild calcification of the aortic valve. Aortic valve regurgitation is not visualized. No aortic stenosis is present.  6. The inferior vena cava is normal in size with greater than 50% respiratory variability, suggesting right atrial pressure of 3 mmHg.  FINDINGS  Left Ventricle: Left ventricular ejection fraction, by estimation, is 60 to 65%. The left ventricle has normal function. The  left ventricle has no regional wall motion abnormalities. The left ventricular internal cavity size was normal in size. There is  severe asymmetric left ventricular hypertrophy of the septal segment. Left ventricular diastolic parameters are consistent with Grade I diastolic dysfunction (impaired relaxation). Right Ventricle: The right ventricular size is mildly enlarged. No increase in right ventricular wall thickness. Right ventricular systolic function is normal. There is mildly elevated pulmonary artery systolic pressure. The tricuspid regurgitant velocity is 2.87 m/s, and with an assumed right atrial pressure of 5 mmHg, the estimated right ventricular systolic pressure is 31.5 mmHg. Left Atrium: Left atrial size was normal in size. Right Atrium: Right atrial size was mild to moderately dilated. Pericardium: There is no evidence of pericardial effusion. Mitral Valve: Mild bileaflet mitral valve prolapse, posterior > anterior. The mitral valve is myxomatous. Trivial mitral valve regurgitation. No evidence of mitral valve stenosis. Tricuspid Valve: The tricuspid valve is normal in structure. Tricuspid valve regurgitation is mild . No evidence of tricuspid stenosis. Aortic Valve: The aortic valve is grossly normal. There is mild calcification of the aortic valve. Aortic valve regurgitation is not visualized. No aortic stenosis is present. Aortic valve peak gradient measures 8.5 mmHg. Pulmonic Valve: The pulmonic valve was thickened with good excursion. Pulmonic valve regurgitation is trivial. No evidence of pulmonic stenosis. Aorta: The aortic root is normal in size and structure. Venous: The inferior vena cava is normal in size with greater than 50% respiratory variability, suggesting right atrial pressure of 3 mmHg. IAS/Shunts: No atrial level shunt detected by color flow Doppler.   LEFT VENTRICLE PLAX 2D LVIDd:         4.30 cm LVIDs:         2.60 cm LV PW:         1.30 cm LV IVS:        1.70 cm LVOT diam:     2.20 cm LV SV:         73 LV SV Index:   39 LVOT Area:     3.80 cm  RIGHT VENTRICLE         IVC TAPSE (M-mode): 2.0 cm  IVC diam: 1.60 cm LEFT ATRIUM             Index        RIGHT ATRIUM           Index LA diam:        2.50 cm 1.32 cm/m   RA Area:     22.00 cm LA Vol (A2C):   82.0 ml 43.24 ml/m  RA Volume:   67.90 ml  35.80 ml/m LA Vol (A4C):   36.0 ml 18.98 ml/m LA Biplane Vol: 54.4 ml 28.68 ml/m  AORTIC VALVE                 PULMONIC VALVE AV Area (Vmax): 2.21 cm     PV Vmax:       0.86 m/s AV Vmax:        146.00 cm/s  PV Peak grad:  3.0 mmHg AV Peak Grad:   8.5 mmHg LVOT Vmax:      85.00 cm/s LVOT Vmean:     51.000 cm/s LVOT VTI:       0.193 m  AORTA Ao Root diam: 3.30 cm Ao Asc diam:  3.60 cm MITRAL VALVE                TRICUSPID VALVE MV Area (PHT): 3.39 cm     TR Peak  grad:   32.9 mmHg MV Decel Time: 224 msec     TR Vmax:        287.00 cm/s MV E velocity: 75.60 cm/s MV A velocity: 140.00 cm/s  SHUNTS MV E/A ratio:  0.54         Systemic VTI:  0.19 m                             Systemic Diam: 2.20 cm Cherlynn Kaiser MD Electronically signed by Cherlynn Kaiser MD Signature Date/Time: 09/13/2021/2:51:21 PM    Final    CT Angio Chest PE W/Cm &/Or Wo Cm  Result Date: 09/12/2021 CLINICAL DATA:  Chest pain or shortness of breath with pleurisy or effusion suspected. EXAM: CT ANGIOGRAPHY CHEST WITH CONTRAST TECHNIQUE: Multidetector CT imaging of the chest was performed using the standard protocol during bolus administration of intravenous contrast. Multiplanar CT image reconstructions and MIPs were obtained to evaluate the vascular anatomy. RADIATION DOSE REDUCTION: This exam was performed according to the departmental dose-optimization program which includes automated exposure control, adjustment of the mA and/or kV according to patient size and/or use of iterative  reconstruction technique. CONTRAST:  3mL OMNIPAQUE IOHEXOL 350 MG/ML SOLN COMPARISON:  07/10/2021 FINDINGS: Cardiovascular: Images somewhat laid after contrast bolus administration but there is moderately good opacification of the central and segmental pulmonary arteries. No focal filling defects are identified. No evidence of significant pulmonary embolus. Central pulmonary arteries are mildly dilated which may provide evidence of pulmonary arterial hypertension. Mild cardiac enlargement. No pericardial effusions. Normal caliber thoracic aorta. No aortic dissection. Calcification of the aorta and coronary arteries. Postoperative changes consistent with coronary bypass. Mediastinum/Nodes: Thyroid gland is unremarkable. No significant lymphadenopathy. Esophagus is decompressed. Lungs/Pleura: Small right pleural effusion with mild basilar atelectasis or consolidation. This is likely compressive atelectasis but could indicate pneumonia. Mild atelectasis in the left base. Diffuse emphysematous changes throughout the lungs. No pneumothorax. Upper Abdomen: No acute abnormalities. Musculoskeletal: Degenerative changes. Sternotomy wires. No acute bony abnormalities. Review of the MIP images confirms the above findings. IMPRESSION: 1. No evidence of significant pulmonary embolus. 2. Dilated central pulmonary arteries may indicate pulmonary arterial hypertension. 3. Right pleural effusion with atelectasis or infiltration in the right lung base. Mild atelectasis in the left base. 4. Aortic atherosclerosis. 5. Diffuse emphysematous changes in the lungs. Electronically Signed   By: Lucienne Capers M.D.   On: 09/12/2021 19:04   US Venous Img Lower Bilateral  Result Date: 09/12/2021 CLINICAL DATA:  Bilateral leg edema EXAM: Bilateral LOWER EXTREMITY VENOUS DOPPLER ULTRASOUND TECHNIQUE: Gray-scale sonography with compression, as well as color and duplex ultrasound, were performed to evaluate the deep venous system(s) from the  level of the common femoral vein through the popliteal and proximal calf veins. COMPARISON:  None Available. FINDINGS: VENOUS Normal compressibility of the common femoral, superficial femoral, and popliteal veins, as well as the visualized calf veins. Visualized portions of profunda femoral vein and great saphenous vein unremarkable. No filling defects to suggest DVT on grayscale or color Doppler imaging. Doppler waveforms show normal direction of venous flow, normal respiratory plasticity and response to augmentation. OTHER None. Limitations: none IMPRESSION: Negative. Electronically Signed   By: Donavan Foil M.D.   On: 09/12/2021 16:22   DG Chest 2 View  Result Date: 09/12/2021 CLINICAL DATA:  An 88 year old male presents for evaluation of shortness of breath. EXAM: CHEST - 2 VIEW COMPARISON:  May 26, 2020 and Jul 11, 2021 chest  radiograph and CT respectively. FINDINGS: Opacity in the RIGHT mid chest corresponding the patient's known treated lung cancer is similar to the previous chest CT scout image from May of 2023. Post median sternotomy for CABG with stable mediastinal contours mild cardiac enlargement and mild fullness of the RIGHT and LEFT hilum. Lungs are hyperinflated. Increased blunting of RIGHT costodiaphragmatic sulcus. No pneumothorax. On limited assessment there is no acute skeletal finding. IMPRESSION: Increased blunting of the RIGHT costodiaphragmatic sulcus. Finding could reflect worsening of pleural fluid and or airspace disease. Signs of COPD with evidence of prior treated lung cancer not substantially changed from prior imaging. Post median sternotomy for CABG. Electronically Signed   By: Zetta Bills M.D.   On: 09/12/2021 15:01    Microbiology: Results for orders placed or performed during the hospital encounter of 09/12/21  Resp Panel by RT-PCR (Flu A&B, Covid) Anterior Nasal Swab     Status: None   Collection Time: 09/12/21  6:04 PM   Specimen: Anterior Nasal Swab  Result Value  Ref Range Status   SARS Coronavirus 2 by RT PCR NEGATIVE NEGATIVE Final    Comment: (NOTE) SARS-CoV-2 target nucleic acids are NOT DETECTED.  The SARS-CoV-2 RNA is generally detectable in upper respiratory specimens during the acute phase of infection. The lowest concentration of SARS-CoV-2 viral copies this assay can detect is 138 copies/mL. A negative result does not preclude SARS-Cov-2 infection and should not be used as the sole basis for treatment or other patient management decisions. A negative result may occur with  improper specimen collection/handling, submission of specimen other than nasopharyngeal swab, presence of viral mutation(s) within the areas targeted by this assay, and inadequate number of viral copies(<138 copies/mL). A negative result must be combined with clinical observations, patient history, and epidemiological information. The expected result is Negative.  Fact Sheet for Patients:  EntrepreneurPulse.com.au  Fact Sheet for Healthcare Providers:  IncredibleEmployment.be  This test is no t yet approved or cleared by the [place] FDA and  has been authorized for detection and/or diagnosis of SARS-CoV-2 by FDA under an Emergency Use Authorization (EUA). This EUA will remain  in effect (meaning this test can be used) for the duration of the COVID-19 declaration under Section 564(b)(1) of the Act, 21 U.S.C.section 360bbb-3(b)(1), unless the authorization is terminated  or revoked sooner.       Influenza A by PCR NEGATIVE NEGATIVE Final   Influenza B by PCR NEGATIVE NEGATIVE Final    Comment: (NOTE) The Xpert Xpress SARS-CoV-2/FLU/RSV plus assay is intended as an aid in the diagnosis of influenza from Nasopharyngeal swab specimens and should not be used as a sole basis for treatment. Nasal washings and aspirates are unacceptable for Xpert Xpress SARS-CoV-2/FLU/RSV testing.  Fact Sheet for  Patients: EntrepreneurPulse.com.au  Fact Sheet for Healthcare Providers: IncredibleEmployment.be  This test is not yet approved or cleared by the [place] FDA and has been authorized for detection and/or diagnosis of SARS-CoV-2 by FDA under an Emergency Use Authorization (EUA). This EUA will remain in effect (meaning this test can be used) for the duration of the COVID-19 declaration under Section 564(b)(1) of the Act, 21 U.S.C. section 360bbb-3(b)(1), unless the authorization is terminated or revoked.  Performed at [facility], [address] [place], [place] [address]     Labs: CBC: Recent Labs  Lab 09/12/21 1509 09/12/21 1519 09/13/21 0221  WBC 4.7  --  5.7  NEUTROABS  --   --  3.6  HGB 10.9*  12.6* 10.1*  HCT 35.6* 37.0* 31.7*  MCV 91.3  --  88.8  PLT 193  --  146   Basic Metabolic Panel: Recent Labs  Lab 09/12/21 1509 09/12/21 1519 09/13/21 0221 09/14/21 0203  NA 143 142 139 139  K 4.0 3.9 3.6 4.1  CL 103  --  103 102  CO2 27  --  27 29  GLUCOSE 110*  --  173* 89  BUN 20  --  17 23  CREATININE 1.29*  --  1.42* 1.70*  CALCIUM 9.8  --  9.0 8.6*  MG  --   --  1.7 2.1   Liver Function Tests: Recent Labs  Lab 09/12/21 1509 09/13/21 0221  AST 15 15  ALT 15 15  ALKPHOS 62 56  BILITOT 0.7 0.7  PROT 7.7 6.5  ALBUMIN 4.7 3.7   CBG: Recent Labs  Lab 09/12/21 2205  GLUCAP 115*    Discharge time spent: greater than 30 minutes.  Signed: Tawni Millers, MD [facility] 09/14/2021

## 2021-09-14 NOTE — Telephone Encounter (Signed)
Pharmacy Patient Advocate Encounter  Insurance verification completed.    The patient is insured through [employer]   The patient is currently admitted and ran test claims for the following: Jardiance, Farxiga.  Copays and coinsurance results were relayed to Inpatient clinical team.

## 2021-09-15 ENCOUNTER — Other Ambulatory Visit: Payer: Self-pay | Admitting: *Deleted

## 2021-09-15 NOTE — Patient Outreach (Signed)
  Care Coordination [facility] Note Transition Care Management Follow-up Telephone Call Date of discharge and from where: [phone] [facility] How have you been since you were released from the hospital?  feel pretty good Any questions or concerns? No  Items Reviewed: Did the pt receive and understand the discharge instructions provided? Yes  Medications obtained and verified? Yes  Other? n Any new allergies since your discharge? No  Dietary orders reviewed? No Do you have support at home? Yes   Home Care and Equipment/Supplies: Were home health services ordered? no If so, what is the name of the agency? N/a   Has the agency set up a time to come to the patient's home? not applicable Were any new equipment or medical supplies ordered?  No What is the name of the medical supply agency? N/a Were you able to get the supplies/equipment? not applicable Do you have any questions related to the use of the equipment or supplies? No  Functional Questionnaire: (I = Independent and D = Dependent) ADLs: I  Bathing/Dressing- I  Meal Prep- I  Eating- I  Maintaining continence- I  Transferring/Ambulation- I  Managing Meds- I  Follow up appointments reviewed:  PCP Hospital f/u appt confirmed? No  Specialist Hospital f/u appt confirmed? Yes  Scheduled to see Nicholes Rough Pa Aug 8  8:30 Are transportation arrangements needed? N If their condition worsens, is the pt aware to call PCP or go to the Emergency Dept.? Yes Was the patient provided with contact information for the PCP's office or ED? Yes Was to pt encouraged to call back with questions or concerns? Yes  SDOH assessments and interventions completed:   Yes  Care Coordination Interventions Activated:  Yes Care Coordination Interventions:  PCP follow up appointment requested  Encounter Outcome:  Pt. Visit Completed

## 2021-09-18 ENCOUNTER — Other Ambulatory Visit: Payer: Self-pay | Admitting: *Deleted

## 2021-09-18 NOTE — Patient Outreach (Signed)
[facility]) Care Management  09/18/2021  Brian [name] 1931/04/15 [id number]   RN received telephone call from patient.  [employer] compliance verified. Patient had gotten a message about an EMMI. He did not know what that was. He stated he was not computer savvy. RN walked the patient through the process and he was able to view the EMMI video.  [place] Care Management [phone]

## 2021-09-25 NOTE — Progress Notes (Unsigned)
Office Visit    Patient Name: Brian [name] Date of Encounter: 09/25/2021  PCP:  Binnie Rail, MD   [place]  Cardiologist:  Lauree Chandler, MD  Advanced Practice Provider:  No care team member to display Electrophysiologist:  None   HPI    Brian [name] is a 88 y.o. male past medical history significant for HTN, hyperlipidemia, DM, COPD and CAD status post CABG presents today for hospital follow-up.  He was admitted to [facility] January 2012 with NSTEMI.  Cardiac cath January 2012 with severe three-vessel CAD.  Underwent CABG x 3 on 03/03/2010.  EVAR December 2020 per Dr. Donzetta Matters for 5.5 cm AAA.  Echo March 2017 with normal LV size and function, mild MR.  He had been diagnosed with lung cancer and has been treated with XRT.  He was last seen by Dr. Angelena Form 11/25/2020.  He presented for follow-up and was doing well at that time.  He was on supplemental oxygen continuously.  No medication changes at that time.  He was in the ER 7/25 through 09/14/2021 with a chief complaint of shortness of breath.  He was found to be in decompensated heart failure.  He was having worsening dyspnea despite supplemental oxygen per [place].  On the day of his hospitalization he was found to have O2 saturation of 77%.  Blood pressure was 189/79, heart rate 66, respiratory rate 32.  Ultrasound of lower extremities negative for DVT.  Chest x-ray with hyperinflation and right pleural effusion, small to moderate.  CT of the chest showed no evidence of PE.  Right pleural effusion, diffuse emphysema.  EKG with sinus rhythm with second-degree type I no significant ST segment or T wave changes.  Patient was placed on furosemide for diuresis and experienced an improvement in his symptoms.  Echocardiogram was performed and showed preserved LV systolic function with positive septal hypertrophy.  Metoprolol discontinued due to bradycardia with second-degree AV block type I but improved  after discontinuation.  Today, he ***  Past Medical History    Past Medical History:  Diagnosis Date   AAA (abdominal aortic aneurysm) ([place])    Arthritis    Asthma 1976   Bronchiolitis 06/2014   CAD (coronary artery disease)    NSTEMI 02/2010;  s/p CABG 1/12 (L-LAD, SOM1, S-PDA); echo in 08/2010: Mild LVH, EF 61%, grade 2 diastolic dysfunction, mild MR, mild LAE, mildly reduced RV function, mild RAE, PASP 42   Carpal tunnel syndrome of left wrist    Colon polyps    COPD (chronic obstructive pulmonary disease) (HCC)    Diabetes mellitus 2000   Type II, controlls with diet   Dyspnea    ED (erectile dysfunction)    Gout    History of radiation therapy 03/09/2020-03/16/2020   SBRT right lung: Dr. Gery Pray   HTN (hypertension)    Hyperlipidemia    Inguinal hernia    Right   Macular degeneration    Myocardial infarction [facility]) 1 -13-2012   Neuropathy    Past Surgical History:  Procedure Laterality Date   3 vessel CABG  03/03/2010   ABDOMINAL AORTIC ENDOVASCULAR STENT GRAFT N/A 01/21/2019   Procedure: ABDOMINAL AORTIC ENDOVASCULAR STENT GRAFT;  Surgeon: Waynetta Sandy, MD;  Location: [place];  Service: Vascular;  Laterality: N/A;   cataract left eye     COLONOSCOPY     neg 2009   COLONOSCOPY  2014   5 mm sessile polyp; AVM  CORONARY ARTERY BYPASS GRAFT  2012   X3   ENDOVASCULAR REPAIR/STENT GRAFT  01/21/2019   ABDOMINAL AORTIC ENDOVASCULAR STENT GRAFT (N/A )   EYE SURGERY     Left cataract   HERNIA REPAIR     INCISION AND DRAINAGE PERIRECTAL ABSCESS     INGUINAL HERNIA REPAIR      Allergies  Allergies  Allergen Reactions   Meperidine Hcl     Tongue swelling Because of a history of documented adverse serious drug reaction;Medi Alert bracelet  is recommended   Sertraline Other (See Comments)    Burning sensation from feet to head, diff breathing   Gabapentin     confusion   Meperidine     Other reaction(s): Angioedema of tongue   Zolpidem     Other  reaction(s): Nightmares   Zolpidem Tartrate Other (See Comments)    Nightmares     EKGs/Labs/Other Studies Reviewed:   The following studies were reviewed today:  Echocardiogram 09/13/2021  IMPRESSIONS     1. Left ventricular ejection fraction, by estimation, is 60 to 65%. The  left ventricle has normal function. The left ventricle has no regional  wall motion abnormalities. There is severe asymmetric left ventricular  hypertrophy of the septal segment. Left   ventricular diastolic parameters are consistent with Grade I diastolic  dysfunction (impaired relaxation).   2. Right ventricular systolic function is normal. The right ventricular  size is mildly enlarged. There is mildly elevated pulmonary artery  systolic pressure. The estimated right ventricular systolic pressure is  81.8 mmHg.   3. Right atrial size was mild to moderately dilated.   4. Mild bileaflet mitral valve prolapse, posterior > anterior. . The  mitral valve is myxomatous. Trivial mitral valve regurgitation. No  evidence of mitral stenosis.   5. The aortic valve is grossly normal. There is mild calcification of the  aortic valve. Aortic valve regurgitation is not visualized. No aortic  stenosis is present.   6. The inferior vena cava is normal in size with greater than 50%  respiratory variability, suggesting right atrial pressure of 3 mmHg.  EKG:  EKG is *** ordered today.  The ekg ordered today demonstrates ***  Recent Labs: 09/12/2021: B Natriuretic Peptide 690.9 09/13/2021: ALT 15; Hemoglobin 10.1; Platelets 161 09/14/2021: BUN 23; Creatinine, Ser 1.70; Magnesium 2.1; Potassium 4.1; Sodium 139  Recent Lipid Panel    Component Value Date/Time   CHOL 141 04/18/2021 1153   TRIG 80.0 04/18/2021 1153   HDL 53.90 04/18/2021 1153   CHOLHDL 3 04/18/2021 1153   VLDL 16.0 04/18/2021 1153   LDLCALC 71 04/18/2021 1153    Risk Assessment/Calculations:  {Does this patient have ATRIAL  FIBRILLATION?:[phone]}  Home Medications   No outpatient medications have been marked as taking for the 09/26/21 encounter (Appointment) with Elgie Collard, PA-C.     Review of Systems   ***   All other systems reviewed and are otherwise negative except as noted above.  Physical Exam    VS:  There were no vitals taken for this visit. , BMI There is no height or weight on file to calculate BMI.  Wt Readings from Last 3 Encounters:  09/14/21 148 lb 5.9 oz (67.3 kg)  07/13/21 155 lb 12.8 oz (70.7 kg)  06/20/21 156 lb (70.8 kg)     GEN: Well nourished, well developed, in no acute distress. HEENT: normal. Neck: Supple, no JVD, carotid bruits, or masses. Cardiac: ***RRR, no murmurs, rubs, or gallops. No clubbing, cyanosis, edema.  ***  Radials/PT 2+ and equal bilaterally.  Respiratory:  ***Respirations regular and unlabored, clear to auscultation bilaterally. GI: Soft, nontender, nondistended. MS: No deformity or atrophy. Skin: Warm and dry, no rash. Neuro:  Strength and sensation are intact. Psych: Normal affect.  Assessment & Plan    Acute on chronic CHF -Preserved LV systolic function of 77% on echocardiogram, severe asymmetric left ventricular hypertrophy of the septal segment, mild mitral valve prolapse  Acute on chronic core pulmonale Acute on chronic hypoxemic respiratory failure, bilateral pleural effusion COPD Stage III chronic kidney disease Hypertension Heart block. AV Second-degree -BB discontinued  No BP recorded.  {Refresh Note OR Click here to enter BP  :1}***      Disposition: Follow up {follow up:15908} with Lauree Chandler, MD or APP.  Signed, Elgie Collard, PA-C 09/25/2021, 9:25 PM [facility]

## 2021-09-26 ENCOUNTER — Ambulatory Visit (INDEPENDENT_AMBULATORY_CARE_PROVIDER_SITE_OTHER): Payer: Medicare Other | Admitting: Physician Assistant

## 2021-09-26 ENCOUNTER — Encounter: Payer: Self-pay | Admitting: Internal Medicine

## 2021-09-26 ENCOUNTER — Encounter: Payer: Self-pay | Admitting: Physician Assistant

## 2021-09-26 VITALS — BP 144/62 | HR 36 | Ht 71.0 in | Wt 155.0 lb

## 2021-09-26 DIAGNOSIS — N1831 Chronic kidney disease, stage 3a: Secondary | ICD-10-CM

## 2021-09-26 DIAGNOSIS — I10 Essential (primary) hypertension: Secondary | ICD-10-CM

## 2021-09-26 DIAGNOSIS — I441 Atrioventricular block, second degree: Secondary | ICD-10-CM | POA: Diagnosis not present

## 2021-09-26 DIAGNOSIS — J9621 Acute and chronic respiratory failure with hypoxia: Secondary | ICD-10-CM | POA: Insufficient documentation

## 2021-09-26 DIAGNOSIS — I5043 Acute on chronic combined systolic (congestive) and diastolic (congestive) heart failure: Secondary | ICD-10-CM | POA: Diagnosis not present

## 2021-09-26 DIAGNOSIS — J9611 Chronic respiratory failure with hypoxia: Secondary | ICD-10-CM | POA: Insufficient documentation

## 2021-09-26 NOTE — Patient Instructions (Addendum)
Medication Instructions:  Your physician recommends that you continue on your current medications as directed. Please refer to the Current Medication list given to you today.  *If you need a refill on your cardiac medications before your next appointment, please call your pharmacy*   Lab Work: None If you have labs (blood work) drawn today and your tests are completely normal, you will receive your results only by: [place] (if you have MyChart) OR A paper copy in the mail If you have any lab test that is abnormal or we need to change your treatment, we will call you to review the results.  Follow-Up: At [facility], you and your health needs are our priority.  As part of our continuing mission to provide you with exceptional heart care, we have created designated Provider Care Teams.  These Care Teams include your primary Cardiologist (physician) and Advanced Practice Providers (APPs -  Physician Assistants and Nurse Practitioners) who all work together to provide you with the care you need, when you need it.   Your next appointment:   3 month(s)  The format for your next appointment:   In Person  Provider:   Lauree Chandler, MD or APP  Other Instructions You have been referred to see electrophysiology (EP) here in our office for next available appointment.  Important Information About Sugar

## 2021-09-26 NOTE — Patient Instructions (Signed)
     Blood work was ordered.  CXR ordered.   Medications changes include :   lexapro 5 mg in the morning, continue mirtazapine at night only   Your prescription(s) have been sent to your pharmacy.    A referral was ordered for neurology.     Someone from that office will call you to schedule an appointment.    Return in about 6 months (around 03/30/2022) for follow up.

## 2021-09-26 NOTE — Progress Notes (Unsigned)
Subjective:    Patient ID: Brian [name], male    DOB: Jul 18, 1931, 88 y.o.   MRN: [id number]     HPI Gen is here for follow up from the hospital.   Admitted 7/25-7/27 for shortness of breath and hypoxia.  Recommendations at discharge:     Patient will continue supplemental 02 per [place] to keep 02 saturation 88% or greater, instructed to follow oxymetry at home and increase his 02 flow as needed.  Added furosemide and empagliflozin. Follow up renal function as outpatient Follow up chest radiograph for resolution of right pleural effusion.    He was seen at the [place] that morning and was advised to go to the emergency room because he was hypoxic on his normal 2 L of oxygen-oxygen was 77%.  Oxygen needed to be turned up to 4 L.  More recently he has noticed that his oxygen level drops with activity.  Work-up in the ED included CTA of lungs which is negative for PE.  He did have a small to moderate right-sided pleural effusion.  WBC normal, no fever, no cough.  Bilateral leg ultrasound negative for DVT.  BNP 690, troponin 45, 43  Admitted for acute on chronic respiratory failure with hypoxia, acute CHF, pleural effusion on right    Acute on chronic respiratory failure with hypoxia: He has poor pulmonary reserve secondary to history of lung cancer s/p radiation and COPD Acute CHF-cardiogenic pulmonary edema - > resp failure 2 L continuous oxygen via nasal cannula  - adjust as needed for oxygen 88% or greater  Acute CHF Right pleural effusion, acute on chronic respiratory failure with hypoxia Placed on IV furosemide and negative fluid balance was achieved Started on furosemide 40 mg daily SGL 2 inhibitor started Lisinopril continued Beta-blocker held due to bradycardia   Tolerated diuresis Magnesium and potassium repleted  Anxiety with panic attacks, depression: Mirtazapine and temazepam continued  Hypertension, bradycardia with second-degree AV block: Metoprolol  discontinued      Medications and allergies reviewed with patient and updated if appropriate.  Current Outpatient Medications on File Prior to Visit  Medication Sig Dispense Refill   ACCU-CHEK COMPACT STRIPS test strip TEST BLOOD SUGAR ONCE DAILY 100 each 1   acetaminophen (TYLENOL) 500 MG tablet Take 500 mg by mouth every 6 (six) hours as needed for mild pain.     albuterol (VENTOLIN HFA) 108 (90 Base) MCG/ACT inhaler Inhale 2 puffs into the lungs every 6 (six) hours as needed for wheezing or shortness of breath.     ANORO ELLIPTA 62.5-25 MCG/ACT AEPB USE 1 INHALATION DAILY (Patient taking differently: Inhale 1 puff into the lungs daily.) 240 each 3   Aspirin 81 MG EC tablet Take 81 mg by mouth daily.     cholecalciferol (VITAMIN D) 1000 UNITS tablet Take 3,000 Units by mouth daily.      empagliflozin (JARDIANCE) 10 MG TABS tablet Take 1 tablet (10 mg total) by mouth daily. 30 tablet 0   enalapril (VASOTEC) 10 MG tablet TAKE 1 TABLET TWICE A DAY (Patient taking differently: Take 10 mg by mouth 2 (two) times daily.) 180 tablet 3   furosemide (LASIX) 40 MG tablet Take 1 tablet (40 mg total) by mouth daily. 30 tablet 0   Magnesium Oxide 420 MG TABS Take 420 mg by mouth daily.      mirtazapine (REMERON) 15 MG tablet Take 1 tablet (15 mg total) by mouth at bedtime. 90 tablet 1   Multiple Vitamins-Minerals (  MULTIVITAL PO) Take 1 tablet by mouth daily.      Polyethyl Glycol-Propyl Glycol (SYSTANE) 0.4-0.3 % SOLN Place 1 drop into both eyes daily.     simvastatin (ZOCOR) 40 MG tablet TAKE 1 TABLET DAILY (Patient taking differently: Take 40 mg by mouth daily.) 90 tablet 3   temazepam (RESTORIL) 7.5 MG capsule Take 1-2 capsules (7.5-15 mg total) by mouth at bedtime as needed for sleep. 60 capsule 5   triamcinolone cream (KENALOG) 0.1 % Apply 1 application topically daily as needed (Rash).     No current facility-administered medications on file prior to visit.     Review of Systems      Objective:  There were no vitals filed for this visit. BP Readings from Last 3 Encounters:  09/14/21 (!) 131/39  07/13/21 (!) 158/50  06/20/21 140/64   Wt Readings from Last 3 Encounters:  09/14/21 148 lb 5.9 oz (67.3 kg)  07/13/21 155 lb 12.8 oz (70.7 kg)  06/20/21 156 lb (70.8 kg)   There is no height or weight on file to calculate BMI.    Physical Exam     Lab Results  Component Value Date   WBC 5.7 09/13/2021   HGB 10.1 (L) 09/13/2021   HCT 31.7 (L) 09/13/2021   PLT 161 09/13/2021   GLUCOSE 89 09/14/2021   CHOL 141 04/18/2021   TRIG 80.0 04/18/2021   HDL 53.90 04/18/2021   LDLCALC 71 04/18/2021   ALT 15 09/13/2021   AST 15 09/13/2021   NA 139 09/14/2021   K 4.1 09/14/2021   CL 102 09/14/2021   CREATININE 1.70 (H) 09/14/2021   BUN 23 09/14/2021   CO2 29 09/14/2021   TSH 1.88 09/26/2016   PSA 1.42 07/16/2007   INR 1.2 01/21/2019   HGBA1C 6.5 04/18/2021   MICROALBUR 9.45 03/19/2018     Assessment & Plan:    See Problem List for Assessment and Plan of chronic medical problems.

## 2021-09-27 ENCOUNTER — Ambulatory Visit (INDEPENDENT_AMBULATORY_CARE_PROVIDER_SITE_OTHER): Payer: Medicare Other

## 2021-09-27 ENCOUNTER — Ambulatory Visit (INDEPENDENT_AMBULATORY_CARE_PROVIDER_SITE_OTHER): Payer: Medicare Other | Admitting: Internal Medicine

## 2021-09-27 VITALS — BP 140/62 | HR 70 | Temp 97.8°F | Ht 71.0 in | Wt 154.0 lb

## 2021-09-27 DIAGNOSIS — J9621 Acute and chronic respiratory failure with hypoxia: Secondary | ICD-10-CM | POA: Diagnosis not present

## 2021-09-27 DIAGNOSIS — R413 Other amnesia: Secondary | ICD-10-CM | POA: Diagnosis not present

## 2021-09-27 DIAGNOSIS — Z0000 Encounter for general adult medical examination without abnormal findings: Secondary | ICD-10-CM | POA: Insufficient documentation

## 2021-09-27 DIAGNOSIS — N182 Chronic kidney disease, stage 2 (mild): Secondary | ICD-10-CM | POA: Diagnosis not present

## 2021-09-27 DIAGNOSIS — I10 Essential (primary) hypertension: Secondary | ICD-10-CM | POA: Diagnosis not present

## 2021-09-27 DIAGNOSIS — R0602 Shortness of breath: Secondary | ICD-10-CM | POA: Insufficient documentation

## 2021-09-27 DIAGNOSIS — F410 Panic disorder [episodic paroxysmal anxiety] without agoraphobia: Secondary | ICD-10-CM

## 2021-09-27 DIAGNOSIS — E1122 Type 2 diabetes mellitus with diabetic chronic kidney disease: Secondary | ICD-10-CM | POA: Diagnosis not present

## 2021-09-27 DIAGNOSIS — J90 Pleural effusion, not elsewhere classified: Secondary | ICD-10-CM | POA: Diagnosis not present

## 2021-09-27 DIAGNOSIS — I509 Heart failure, unspecified: Secondary | ICD-10-CM

## 2021-09-27 DIAGNOSIS — G479 Sleep disorder, unspecified: Secondary | ICD-10-CM

## 2021-09-27 DIAGNOSIS — N1831 Chronic kidney disease, stage 3a: Secondary | ICD-10-CM | POA: Diagnosis not present

## 2021-09-27 DIAGNOSIS — J439 Emphysema, unspecified: Secondary | ICD-10-CM | POA: Diagnosis not present

## 2021-09-27 LAB — HEMOGLOBIN A1C: Hgb A1c MFr Bld: 6.7 % — ABNORMAL HIGH (ref 4.6–6.5)

## 2021-09-27 LAB — BASIC METABOLIC PANEL
BUN: 35 mg/dL — ABNORMAL HIGH (ref 6–23)
CO2: 33 mEq/L — ABNORMAL HIGH (ref 19–32)
Calcium: 9.2 mg/dL (ref 8.4–10.5)
Chloride: 103 mEq/L (ref 96–112)
Creatinine, Ser: 1.92 mg/dL — ABNORMAL HIGH (ref 0.40–1.50)
GFR: 30.9 mL/min — ABNORMAL LOW (ref >60.00)
Glucose, Bld: 97 mg/dL (ref 70–99)
Potassium: 4.3 mEq/L (ref 3.5–5.1)
Sodium: 140 mEq/L (ref 135–145)

## 2021-09-27 MED ORDER — ESCITALOPRAM OXALATE 5 MG PO TABS: 5.0000 mg | ORAL_TABLET | Freq: Every morning | ORAL | 5 refills | Status: DC

## 2021-09-27 MED ORDER — MIRTAZAPINE 15 MG PO TABS
15.0000 mg | ORAL_TABLET | Freq: Every day | ORAL | 1 refills | Status: DC
Start: 2021-09-27 — End: 2022-04-02

## 2021-09-27 NOTE — Assessment & Plan Note (Signed)
Acute Having some difficulties with his memory per his son and even he admits to some Agree further evaluation would be a good idea-refer to neurology

## 2021-09-27 NOTE — Assessment & Plan Note (Addendum)
Chronic A1c Diet controlled Lab Results  Component Value Date   HGBA1C 6.7 (H) 09/27/2021

## 2021-09-27 NOTE — Assessment & Plan Note (Signed)
Improved On oxygen via nasal cannula 2 L/min-when he is going to become active and active he does increase his oxygen to 3-4 L/min This is helped him feel better, less short of breath and has decreased his anxiety He will continue to adjust the oxygen according to activity level Oxygenation here 95% on 2 L via nasal cannula

## 2021-09-27 NOTE — Assessment & Plan Note (Signed)
Much improved He did undergo diuresis at the hospital and since then has remained euvolemic on Lasix 40 mg daily Saw cardiology yesterday Also on Jardiance 10 mg daily BMP today Continue Lasix 40 mg daily and Jardiance 10 mg daily Continue enalapril 10 mg twice daily

## 2021-09-27 NOTE — Assessment & Plan Note (Signed)
Chronic Blood pressure here today reasonably controlled Continue enalapril 10 mg twice daily

## 2021-09-27 NOTE — Assessment & Plan Note (Signed)
Chronic Currently taking temazepam 7.5-15 mg at night in addition to mirtazapine 15 mg at night Has been taking 7.5 mg of mirtazapine in the morning and in the afternoon Anxiety improved on this regimen, but he does want to start driving again and I am concerned about him being on the mirtazapine during the day we will discontinue the mirtazapine during the day and start Lexapro 5 mg daily in the morning and continue his current nighttime medication Adjusting the oxygen during the day has also help to improve his anxiety

## 2021-09-27 NOTE — Assessment & Plan Note (Signed)
Chronic Controlled Continue mirtazapine 15 mg at night and Restoril 7.5-15 mg at night

## 2021-09-27 NOTE — Assessment & Plan Note (Signed)
Chronic Check BMP today Doing well on Lasix 40 mg daily-euvolemic Doing well on Jardiance 10 mg daily Continue above

## 2021-10-02 ENCOUNTER — Encounter: Payer: Self-pay | Admitting: Internal Medicine

## 2021-10-02 ENCOUNTER — Ambulatory Visit (INDEPENDENT_AMBULATORY_CARE_PROVIDER_SITE_OTHER): Payer: No Typology Code available for payment source | Admitting: Internal Medicine

## 2021-10-02 VITALS — BP 138/74 | HR 47 | Ht 71.0 in | Wt 151.8 lb

## 2021-10-02 DIAGNOSIS — Z79899 Other long term (current) drug therapy: Secondary | ICD-10-CM | POA: Diagnosis not present

## 2021-10-02 DIAGNOSIS — N1831 Chronic kidney disease, stage 3a: Secondary | ICD-10-CM

## 2021-10-02 DIAGNOSIS — I441 Atrioventricular block, second degree: Secondary | ICD-10-CM

## 2021-10-02 NOTE — Patient Instructions (Signed)
Medication Instructions:  Your physician recommends that you continue on your current medications as directed. Please refer to the Current Medication list given to you today.  *If you need a refill on your cardiac medications before your next appointment, please call your pharmacy*   Lab Work: None ordered.  If you have labs (blood work) drawn today and your tests are completely normal, you will receive your results only by: MyChart Message (if you have MyChart) OR A paper copy in the mail If you have any lab test that is abnormal or we need to change your treatment, we will call you to review the results.   Testing/Procedures: None ordered.    Follow-Up: At [facility], you and your health needs are our priority.  As part of our continuing mission to provide you with exceptional heart care, we have created designated Provider Care Teams.  These Care Teams include your primary Cardiologist (physician) and Advanced Practice Providers (APPs -  Physician Assistants and Nurse Practitioners) who all work together to provide you with the care you need, when you need it.  We recommend signing up for the patient portal called "MyChart".  Sign up information is provided on this After Visit Summary.  MyChart is used to connect with patients for Virtual Visits (Telemedicine).  Patients are able to view lab/test results, encounter notes, upcoming appointments, etc.  Non-urgent messages can be sent to your provider as well.   To learn more about what you can do with MyChart, go to [email or web address].    Your next appointment:   Follow up with Dr Klein as needed  Important Information About Sugar       

## 2021-10-02 NOTE — Progress Notes (Signed)
ELECTROPHYSIOLOGY CONSULT NOTE  Patient ID: Brian [name], MRN: [id number], DOB/AGE: 86/30/1933 86 y.o. Admit date: (Not on file) Date of Consult: 10/02/2021  Primary Physician: Binnie Rail, MD Primary Cardiologist: [facility]     Brian [name] is a 88 y.o. male who is being seen today for the evaluation of Bradycardia  at the request of [facility].    HPI Brian [name] is a 88 y.o. male referred because of bradycardia noted on prior ECGs and more obvious following a recent hospitalization for HFpEF  History of coronary artery disease with prior bypass surgery 1/12 following presentation with non-STEMI.  AAA repair 12/20  7/23 seen in the emergency room for shortness of breath.  Associated with significant volume overload and peripheral edema.  At that point had been unable to walk up 3 or 4 steps.  Was found to have bradycardia and beta-blockers were discontinued.  Hospitalized, had significant nocturnal bradycardia aggressively diuresed and feeling much much better.  2 thumbs up.  No history of   presyncope or syncope.  Some mild orthostatic lightheadedness.  Oxygen dependent COPD   DATE TEST EF   2/17 Echo   55 % LVH mild  7/23 Echo  60-65% LVH severe (17/13) MVP bivalvular    Date Cr K Hgb  8/23 1.92<<1.29 4.3 10.1            Past Medical History:  Diagnosis Date   AAA (abdominal aortic aneurysm) ([place])    Arthritis    Asthma 1976   Bronchiolitis 06/2014   CAD (coronary artery disease)    NSTEMI 02/2010;  s/p CABG 1/12 (L-LAD, SOM1, S-PDA); echo in 08/2010: Mild LVH, EF 84%, grade 2 diastolic dysfunction, mild MR, mild LAE, mildly reduced RV function, mild RAE, PASP 42   Carpal tunnel syndrome of left wrist    Colon polyps    COPD (chronic obstructive pulmonary disease) (HCC)    Diabetes mellitus 2000   Type II, controlls with diet   Dyspnea    ED (erectile dysfunction)    Gout    History of radiation therapy 03/09/2020-03/16/2020   SBRT right lung: Dr.  Gery Pray   HTN (hypertension)    Hyperlipidemia    Inguinal hernia    Right   Macular degeneration    Myocardial infarction [facility]) 1 -13-2012   Neuropathy       Surgical History:  Past Surgical History:  Procedure Laterality Date   3 vessel CABG  03/03/2010   ABDOMINAL AORTIC ENDOVASCULAR STENT GRAFT N/A 01/21/2019   Procedure: ABDOMINAL AORTIC ENDOVASCULAR STENT GRAFT;  Surgeon: Waynetta Sandy, MD;  Location: [facility];  Service: Vascular;  Laterality: N/A;   cataract left eye     COLONOSCOPY     neg 2009   COLONOSCOPY  2014   5 mm sessile polyp; AVM   CORONARY ARTERY BYPASS GRAFT  2012   X3   ENDOVASCULAR REPAIR/STENT GRAFT  01/21/2019   ABDOMINAL AORTIC ENDOVASCULAR STENT GRAFT (N/A )   EYE SURGERY     Left cataract   HERNIA REPAIR     INCISION AND DRAINAGE PERIRECTAL ABSCESS     INGUINAL HERNIA REPAIR       Home Meds: Current Meds  Medication Sig   ACCU-CHEK COMPACT STRIPS test strip TEST BLOOD SUGAR ONCE DAILY   acetaminophen (TYLENOL) 500 MG tablet Take 500 mg by mouth every 6 (six) hours as needed for mild pain.   albuterol (VENTOLIN HFA) 108 (90 Base)  MCG/ACT inhaler Inhale 2 puffs into the lungs every 6 (six) hours as needed for wheezing or shortness of breath.   ANORO ELLIPTA 62.5-25 MCG/ACT AEPB USE 1 INHALATION DAILY (Patient taking differently: Inhale 1 puff into the lungs daily.)   Aspirin 81 MG EC tablet Take 81 mg by mouth daily.   cholecalciferol (VITAMIN D) 1000 UNITS tablet Take 3,000 Units by mouth daily.    empagliflozin (JARDIANCE) 10 MG TABS tablet Take 1 tablet (10 mg total) by mouth daily.   enalapril (VASOTEC) 10 MG tablet TAKE 1 TABLET TWICE A DAY (Patient taking differently: Take 10 mg by mouth 2 (two) times daily.)   escitalopram (LEXAPRO) 5 MG tablet Take 1 tablet (5 mg total) by mouth every morning.   furosemide (LASIX) 40 MG tablet Take 1 tablet (40 mg total) by mouth daily.   Magnesium Oxide 420 MG TABS Take 420 mg by mouth daily.     mirtazapine (REMERON) 15 MG tablet Take 1 tablet (15 mg total) by mouth at bedtime.   Multiple Vitamins-Minerals (MULTIVITAL PO) Take 1 tablet by mouth daily.    Polyethyl Glycol-Propyl Glycol (SYSTANE) 0.4-0.3 % SOLN Place 1 drop into both eyes daily.   simvastatin (ZOCOR) 40 MG tablet TAKE 1 TABLET DAILY (Patient taking differently: Take 40 mg by mouth daily.)   temazepam (RESTORIL) 7.5 MG capsule Take 1-2 capsules (7.5-15 mg total) by mouth at bedtime as needed for sleep.   triamcinolone cream (KENALOG) 0.1 % Apply 1 application topically daily as needed (Rash).    Allergies:  Allergies  Allergen Reactions   Meperidine Hcl     Tongue swelling Because of a history of documented adverse serious drug reaction;Medi Alert bracelet  is recommended   Sertraline Other (See Comments)    Burning sensation from feet to head, diff breathing   Gabapentin     confusion   Meperidine     Other reaction(s): Angioedema of tongue   Zolpidem     Other reaction(s): Nightmares   Zolpidem Tartrate Other (See Comments)    Nightmares    Social History   Socioeconomic History   Marital status: Widowed    Spouse name: Not on file   Number of children: 3   Years of education: 66   Highest education level: Not on file  Occupational History   Occupation: RETIRED    Employer: RETIRED  Tobacco Use   Smoking status: Former    Packs/day: 1.00    Years: 40.00    Total pack years: 40.00    Types: Cigarettes    Quit date: 02/20/2003    Years since quitting: 18.6   Smokeless tobacco: Never  Vaping Use   Vaping Use: Never used  Substance and Sexual Activity   Alcohol use: Not Currently    Alcohol/week: 6.0 standard drinks of alcohol    Types: 3 Glasses of wine, 3 Shots of liquor per week    Comment:  1-3 drinks a week   Drug use: No   Sexual activity: Not on file  Other Topics Concern   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     One story home   Right handed   3 children   Social Determinants of Health   Financial Resource Strain: Low Risk  (08/24/2021)   Overall Financial Resource Strain (CARDIA)    Difficulty of Paying Living Expenses: Not hard at all  Food Insecurity: No Food Insecurity (08/24/2021)  Hunger Vital Sign    Worried About Running Out of Food in the Last Year: Never true    Ran Out of Food in the Last Year: Never true  Transportation Needs: No Transportation Needs (08/24/2021)   PRAPARE - Hydrologist (Medical): No    Lack of Transportation (Non-Medical): No  Physical Activity: Inactive (08/24/2021)   Exercise Vital Sign    Days of Exercise per Week: 0 days    Minutes of Exercise per Session: 0 min  Stress: No Stress Concern Present (08/24/2021)   [place]    Feeling of Stress : Not at all  Social Connections: Moderately Integrated (08/24/2021)   Social Connection and Isolation Panel [NHANES]    Frequency of Communication with Friends and Family: More than three times a week    Frequency of Social Gatherings with Friends and Family: More than three times a week    Attends Religious Services: More than 4 times per year    Active Member of [employer] or Organizations: Yes    Attends Archivist Meetings: More than 4 times per year    Marital Status: Widowed  Intimate Partner Violence: Not At Risk (08/24/2021)   Humiliation, Afraid, Rape, and Kick questionnaire    Fear of Current or Ex-Partner: No    Emotionally Abused: No    Physically Abused: No    Sexually Abused: No     Family History  Problem Relation Age of Onset   Heart failure Mother    Heart disease Mother        CHF   Heart attack Mother    Hypertension Mother    Heart attack Father    Heart disease Father 63       MI   Hypertension Father    Heart attack Sister 10   Heart disease Sister 56       MI   Diabetes Sister     Hyperlipidemia Sister    Hypertension Sister    Varicose Veins Sister    Heart attack Brother 8   Diabetes Brother    Heart disease Brother 53       MI   Cancer Brother    Hypertension Brother    Deep vein thrombosis Daughter    Leukemia Daughter    Other Son        varicose veins   Arthritis/Rheumatoid Son    Lupus Daughter    Colon cancer Neg Hx      ROS:  Please see the history of present illness.     All other systems reviewed and negative.    Physical Exam: Blood pressure 138/74, pulse (!) 47, height 5\' 11"  (1.803 m), weight 151 lb 12.8 oz (68.9 kg), SpO2 91 %. General: Well developed, well nourished male in no acute distress wearing oxygen Head: Normocephalic, atraumatic, sclera non-icteric, no xanthomas, nares are without discharge. EENT: normal  Lymph Nodes:  none Neck: Negative for carotid bruits. JVD not elevated. Back:without scoliosis kyphosis Lungs: Clear bilaterally to auscultation without wheezes, rales, or rhonchi. Breathing is unlabored. Heart: RRR with S1 S2. No murmur . No rubs, or gallops appreciated. Abdomen: Soft, non-tender, non-distended with normoactive bowel sounds. No hepatomegaly. No rebound/guarding. No obvious abdominal masses. Msk:  Strength and tone appear normal for age. Extremities: No clubbing or cyanosis. No edema.  Distal pedal pulses are 2+ and equal bilaterally. Skin: Warm and Dry Neuro: Alert and oriented X 3. CN  III-XII intact Grossly normal sensory and motor function .  Sitting in a wheelchair not ambulating Psych:  Responds to questions appropriately with a normal affect.        EKG: Sinus with variable rate ranging from 60-75 with either Mobitz 1 second-degree AV block or blocked PACs; the PR interval on the third beat in series is shorter than the second beat suggesting that the second beat is a PAC and the third being is the recovery be, some of the PACs failed to conduct  8/23 sinus rhythm with some sinus arrhythmia at a rate  of about 80 beats-60 bpm Right bundle branch block left axis deviation 20/14/46  7/23 sinus at about 75 with variable PP intervals right bundle branch block left anterior fascicular block  10/22 sinus rhythm at variable PP intervals ranging from 55 bpm--80 bpm  10/21 sinus with variable rates at 75--50 bpm with some PR interval suggestive nonconducted with junctional escape (PR interval up to 600 ms) ECG 11/1910/19 sinus rhythm with variable PP intervals again in a pattern of bigeminy suggestive of possible sinus node exit block with 3: 2 Wenckebach physiology     Assessment and Plan:   AV nodal conduction disease with first-degree AV block second-degree Mobitz 1 heart block  Sinus node irregularities?  Sinus node exit block  HFpEF  Ischemic heart disease with prior bypass  Hypertrophic-asymmetric heart disease  Renal insufficiency-grade 4  The patient's conduction system disease is better off beta-blockers.  He has longstanding sinus node issues longstanding AV nodal conduction issues both above and below the His-Purkinje system.  He has had no attributable symptoms and this point I think there is no indication for pacing.  There is an issue regarding his hypertrophic heart disease and the asymmetry in the possibility that he has hypertrophic cardiomyopathy.  I have asked Dr. Margaretann Loveless to review the echo and we will think about this together as to see about making a definitive diagnosis.  In the event that it were TTR amyloid, at 89 and all things are being indication for therapy.  His renal insufficiency is worsening.  I spoke with the son after the patient left the office, he had not heard from primary care despite Dr. Jenness Corner message.  We will have him hold his furosemide for 2 days and then begin at 40 mg every other day.  We will check a metabolic profile next week.  No angina.  We will follow-up with his primary cardiology team and we will see him again as needed he is  advised to call us if he has presyncope or syncopal symptoms        Brian [name]

## 2021-10-04 ENCOUNTER — Other Ambulatory Visit (INDEPENDENT_AMBULATORY_CARE_PROVIDER_SITE_OTHER): Payer: Medicare Other

## 2021-10-04 ENCOUNTER — Encounter: Payer: Self-pay | Admitting: Physician Assistant

## 2021-10-04 ENCOUNTER — Ambulatory Visit (INDEPENDENT_AMBULATORY_CARE_PROVIDER_SITE_OTHER): Payer: Medicare Other | Admitting: Physician Assistant

## 2021-10-04 VITALS — BP 153/60 | HR 65 | Resp 18 | Ht 71.0 in | Wt 150.0 lb

## 2021-10-04 DIAGNOSIS — R413 Other amnesia: Secondary | ICD-10-CM

## 2021-10-04 DIAGNOSIS — F01A0 Vascular dementia, mild, without behavioral disturbance, psychotic disturbance, mood disturbance, and anxiety: Secondary | ICD-10-CM | POA: Diagnosis not present

## 2021-10-04 LAB — TSH: TSH: 1.77 u[IU]/mL (ref 0.35–5.50)

## 2021-10-04 LAB — VITAMIN B12: Vitamin B-12: 183 pg/mL — ABNORMAL LOW (ref 211–911)

## 2021-10-04 MED ORDER — DIAZEPAM 2 MG PO TABS
ORAL_TABLET | ORAL | 0 refills | Status: DC
Start: 2021-10-04 — End: 2021-12-27

## 2021-10-04 NOTE — Progress Notes (Signed)
Assessment/Plan:    The patient is seen in neurologic consultation at the request of Burns, [name], MD for the evaluation of memory.  Brian [name] is a very pleasant 88 y.o. year old RH male with  a history of hypertension, hyperlipidemia, history of bradycardia, CAD, COPD, DM 2, anxiety, depression seen today for evaluation of memory loss. 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   Recommendations:   Mixed vascular and Alzheimer's dementia  MRI brain without contrast to assess for underlying structural abnormality and assess vascular load  Check B12, TSH Patient prefers to wait until receiving antidementia medication.  He prefers to first go over the MRI results. Folllow up in 1 month  Subjective:    The patient is accompanied by his son who supplements the history.    How long did patient have memory difficulties? After death of his daughter 2 yrs ago, he began to show memory changes, worse over the last 6 months.  His son reports that his short-term memory is worse.  Since last month, he is having difficulty remembering recent conversations.  Sometimes, he has issues with comprehension, especially when it comes to simple and complex tasks.  He does not enjoy doing word finding or crossword puzzles.  He enjoys watching TV, and playing in the computer.   Patient lives with:  Patient lives alone repeats oneself? Endorsed. He repeats same questions and stories Disoriented when walking into a room?  Patient denies   Leaving objects in unusual places?  May forget where he put stuff sometimes. Ambulates  with difficulty?   Walks with Oxygen so sometimes uses a wheelchair for comfort.  Other than that, he is able to walk without any difficulty. Recent falls?  Patient denies   Any head injuries?  Patient denies   History of seizures?   Patient denies   Wandering behavior?  Patient denies   Patient drives?   Short distances  Any mood changes such irritability ?  "When he realizes that he cannot answer the question, or has any issues with his short-term memory, he may become more irritable ". Any history of depression? Endorsed.  He is daughter died about 2 years ago, and he is still in the process of grieving. Hallucinations?  Patient denies   Paranoia?  Patient denies   Patient reports that he sleeps well  with O2 2 l without vivid dreams, REM behavior or sleepwalking    History of sleep apnea?  Patient denies   Any hygiene concerns?  Patient denies   Independent of bathing and dressing?  Endorsed  Does the patient needs help with medications?  Denies Who is in charge of the finances?  Patient is in charge   Any changes in appetite?  Patient denies   Patient have trouble swallowing? Patient denies   Does the patient cook? Endorsed    Any kitchen accidents such as leaving the stove on? Patient denies   Any headaches?  Patient denies   Double vision? Patient denies   Any focal numbness or tingling?  Patient denies   Chronic back pain Patient denies   Unilateral weakness?  Patient denies   Any tremors?  Patient denies   Any history of anosmia?  Patient denies   Any incontinence of urine?  Patient denies. "Sometimes he has to go because of the diuretics "   Any bowel dysfunction?   Patient denies   History of heavy  alcohol intake?  Patient denies   History of heavy tobacco use?  Patient denies . Quit in 2005  Family history of dementia?  Memory issues in several siblings ? Alzheimer's disease    Allergies  Allergen Reactions   Meperidine Hcl     Tongue swelling Because of a history of documented adverse serious drug reaction;Medi Alert bracelet  is recommended   Sertraline Other (See Comments)    Burning sensation from feet to head, diff breathing   Gabapentin     confusion   Meperidine     Other reaction(s): Angioedema of tongue   Zolpidem     Other reaction(s): Nightmares    Zolpidem Tartrate Other (See Comments)    Nightmares    Current Outpatient Medications  Medication Instructions   ACCU-CHEK COMPACT STRIPS test strip TEST BLOOD SUGAR ONCE DAILY   acetaminophen (TYLENOL) 500 mg, Oral, Every 6 hours PRN   albuterol (VENTOLIN HFA) 108 (90 Base) MCG/ACT inhaler 2 puffs, Inhalation, Every 6 hours PRN   ANORO ELLIPTA 62.5-25 MCG/ACT AEPB USE 1 INHALATION DAILY   Aspirin 81 mg, Oral, Daily,     cholecalciferol (VITAMIN D) 3,000 Units, Oral, Daily   diazepam (VALIUM) 2 MG tablet Take 1 tab 30 mins prior to the procedure, may take an additional tab as needed   enalapril (VASOTEC) 10 MG tablet TAKE 1 TABLET TWICE A DAY   escitalopram (LEXAPRO) 5 mg, Oral, Every morning   furosemide (LASIX) 40 mg, Oral, Daily   Jardiance 10 mg, Oral, Daily   Magnesium Oxide 420 mg, Oral, Daily   mirtazapine (REMERON) 15 mg, Oral, Daily at bedtime   Multiple Vitamins-Minerals (MULTIVITAL PO) 1 tablet, Oral, Daily   Polyethyl Glycol-Propyl Glycol (SYSTANE) 0.4-0.3 % SOLN 1 drop, Both Eyes, Daily   simvastatin (ZOCOR) 40 MG tablet TAKE 1 TABLET DAILY   temazepam (RESTORIL) 7.5-15 mg, Oral, At bedtime PRN   triamcinolone cream (KENALOG) 0.1 % 1 application , Topical, Daily PRN     VITALS:   Vitals:   10/04/21 0950  BP: (!) 153/60  Pulse: 65  Resp: 18  SpO2: 91%  Weight: 150 lb (68 kg)  Height: 5\' 11"  (1.803 m)      09/27/2021    8:42 AM 08/24/2021    4:12 PM 04/18/2021   11:16 AM 04/18/2021   11:00 AM 04/20/2020    8:32 AM  Depression screen PHQ 2/9  Decreased Interest 2 2 2  0 0  Down, Depressed, Hopeless 1 0 0 0 0  PHQ - 2 Score 3 2 2  0 0  Altered sleeping 1  0    Tired, decreased energy 1  1    Change in appetite 0  1    Feeling bad or failure about yourself  0  0    Trouble concentrating 1  0    Moving slowly or fidgety/restless 1  0    Suicidal thoughts 0  0    PHQ-9 Score 7  4    Difficult doing work/chores Somewhat difficult  Not difficult at all       PHYSICAL EXAM   HEENT:  Normocephalic, atraumatic. The mucous membranes are moist. The superficial temporal arteries are without ropiness or tenderness. Cardiovascular: Regular rate and rhythm. Lungs: Clear to auscultation bilaterally. Neck: There are no carotid bruits noted bilaterally.  NEUROLOGICAL:    10/04/2021    2:00 PM  Montreal Cognitive Assessment   Visuospatial/ Executive (0/5) 2  Naming (0/3) 2  Attention: Read list of  digits (0/2) 1  Attention: Read list of letters (0/1) 1  Attention: Serial 7 subtraction starting at 100 (0/3) 1  Language: Repeat phrase (0/2) 0  Language : Fluency (0/1) 1  Abstraction (0/2) 1  Delayed Recall (0/5) 1  Orientation (0/6) 6  Total 16  Adjusted Score (based on education) 16        No data to display           Orientation:  Alert and oriented to person, place and time. No aphasia or dysarthria. Fund of knowledge is appropriate. Recent memory impaired and remote memory intact.  Attention and concentration are normal.  Able to name objects and repeat phrases. Delayed recall  1/5  Cranial nerves: There is good facial symmetry. Extraocular muscles are intact and visual fields are full to confrontational testing. Speech is fluent and clear. Soft palate rises symmetrically and there is no tongue deviation. Hearing is intact to conversational tone. Tone: Tone is good throughout. Sensation: Sensation is intact to light touch and pinprick throughout. Vibration is intact at the bilateral big toe.There is no extinction with double simultaneous stimulation. There is no sensory dermatomal level identified. Coordination: The patient has no difficulty with RAM's or FNF bilaterally. Normal finger to nose  Motor: Strength is 5/5 in the bilateral upper and lower extremities. There is no pronator drift. There are no fasciculations noted. DTR's: Deep tendon reflexes are 2/4 at the bilateral biceps, triceps, brachioradialis, patella and achilles.   Plantar responses are downgoing bilaterally. Gait and Station: The patient is able to ambulate without difficulty.The patient is able to heel toe walk without any difficulty.The patient is able to ambulate in a tandem fashion. The patient is able to stand in the Romberg position.     Thank you for allowing [place] the opportunity to participate in the care of this nice patient. Please do not hesitate to contact us for any questions or concerns.   Total time spent on today's visit was 50 minutes dedicated to this patient today, preparing to see patient, examining the patient, ordering tests and/or medications and counseling the patient, documenting clinical information in the EHR or other health record, independently interpreting results and communicating results to the patient/family, discussing treatment and goals, answering patient's questions and coordinating care.  Cc:  Binnie Rail, MD  Sharene Butters 10/04/2021 2:09 PM

## 2021-10-04 NOTE — Patient Instructions (Addendum)
It was a pleasure to see you today at our office.   Recommendations:  Follow up in  1 month MRI brain  Check labs today    Whom to call:  Memory  decline, memory medications: Call our office [phone]   For psychiatric meds, mood meds: Please have your primary care physician manage these medications.   Counseling regarding caregiver distress, including caregiver depression, anxiety and issues regarding community resources, adult day care programs, adult living facilities, or memory care questions:   Feel free to contact Shannon, Social Worker at [phone]   For assessment of decision of mental capacity and competency:  Call Dr. Anthoney Harada, geriatric psychiatrist at [phone]   For guidance regarding [facility] and if placement were needed at the facility, contact Arnell Asal, Social Worker tel: [phone]  If you have any severe symptoms of a stroke, or other severe issues such as confusion,severe chills or fever, etc call 911 or go to the ER as you may need to be evaluated further    We have sent a referral to [facility] for your MRI and they will call you directly to schedule your appointment. They are located at [address] [place]. If you need to contact them directly please call [phone].  Your provider has requested that you have labwork completed today. Please go to [facility] ([address]) on the second floor of this building before leaving the office today. You do not need to check in. If you are not called within 15 minutes please check with the front desk.   Feel free to visit Facebook page " [email or web address]" for tips of how to care for people with memory problems.   Feel free to go to the following database for funded clinical studies conducted around the world: http://saunders.com/   https://www.triadclinicaltrials.com/     RECOMMENDATIONS FOR ALL PATIENTS WITH MEMORY PROBLEMS: 1. Continue to  exercise (Recommend 30 minutes of walking everyday, or 3 hours every week) 2. Increase social interactions - continue going to [place] and enjoy social gatherings with friends and family 3. Eat healthy, avoid fried foods and eat more fruits and vegetables 4. Maintain adequate blood pressure, blood sugar, and blood cholesterol level. Reducing the risk of stroke and cardiovascular disease also helps promoting better memory. 5. Avoid stressful situations. Live a simple life and avoid aggravations. Organize your time and prepare for the next day in anticipation. 6. Sleep well, avoid any interruptions of sleep and avoid any distractions in the bedroom that may interfere with adequate sleep quality 7. Avoid sugar, avoid sweets as there is a strong link between excessive sugar intake, diabetes, and cognitive impairment We discussed the Mediterranean diet, which has been shown to help patients reduce the risk of progressive memory disorders and reduces cardiovascular risk. This includes eating fish, eat fruits and green leafy vegetables, nuts like almonds and hazelnuts, walnuts, and also use olive oil. Avoid fast foods and fried foods as much as possible. Avoid sweets and sugar as sugar use has been linked to worsening of memory function.  There is always a concern of gradual progression of memory problems. If this is the case, then we may need to adjust level of care according to patient needs. Support, both to the patient and caregiver, should then be put into place.    The Alzheimer's Association is here all day, every day for people facing Alzheimer's disease through our free 24/7 Helpline: [phone]. The Helpline provides reliable information and support  to all those who need assistance, such as individuals living with memory loss, Alzheimer's or other dementia, caregivers, health care professionals and the public.  Our highly trained and knowledgeable staff can help you with: Understanding memory loss,  dementia and Alzheimer's  Medications and other treatment options  General information about aging and brain health  Skills to provide quality care and to find the best care from professionals  Legal, financial and living-arrangement decisions Our Helpline also features: Confidential care consultation provided by master's level clinicians who can help with decision-making support, crisis assistance and education on issues families face every day  Help in a caller's preferred language using our translation service that features more than 200 languages and dialects  Referrals to local community programs, services and ongoing support     FALL PRECAUTIONS: Be cautious when walking. Scan the area for obstacles that may increase the risk of trips and falls. When getting up in the mornings, sit up at the edge of the bed for a few minutes before getting out of bed. Consider elevating the bed at the head end to avoid drop of blood pressure when getting up. Walk always in a well-lit room (use night lights in the walls). Avoid area rugs or power cords from appliances in the middle of the walkways. Use a walker or a cane if necessary and consider physical therapy for balance exercise. Get your eyesight checked regularly.  FINANCIAL OVERSIGHT: Supervision, especially oversight when making financial decisions or transactions is also recommended.  HOME SAFETY: Consider the safety of the kitchen when operating appliances like stoves, microwave oven, and blender. Consider having supervision and share cooking responsibilities until no longer able to participate in those. Accidents with firearms and other hazards in the house should be identified and addressed as well.   ABILITY TO BE LEFT ALONE: If patient is unable to contact 911 operator, consider using LifeLine, or when the need is there, arrange for someone to stay with patients. Smoking is a fire hazard, consider supervision or cessation. Risk of wandering  should be assessed by caregiver and if detected at any point, supervision and safe proof recommendations should be instituted.  MEDICATION SUPERVISION: Inability to self-administer medication needs to be constantly addressed. Implement a mechanism to ensure safe administration of the medications.   DRIVING: Regarding driving, in patients with progressive memory problems, driving will be impaired. We advise to have someone else do the driving if trouble finding directions or if minor accidents are reported. Independent driving assessment is available to determine safety of driving.   If you are interested in the driving assessment, you can contact the following:  [employer] in [place]  [place] [phone] or [phone]      [place] refers to food and lifestyle choices that are based on the traditions of countries located on the [employer]. This way of eating has been shown to help prevent certain conditions and improve outcomes for people who have chronic diseases, like kidney disease and heart disease. What are tips for following this plan? Lifestyle  Cook and eat meals together with your family, when possible. Drink enough fluid to keep your urine clear or pale yellow. Be physically active every day. This includes: Aerobic exercise like running or swimming. Leisure activities like gardening, walking, or housework. Get 7-8 hours of sleep each night. If recommended by your health care provider, drink red wine in moderation.  This means 1 glass a day for nonpregnant women and 2 glasses a day for men. A glass of wine equals 5 oz (150 mL). Reading food labels  Check the serving size of packaged foods. For foods such as rice and pasta, the serving size refers to the amount of cooked product, not dry. Check the total fat in  packaged foods. Avoid foods that have saturated fat or trans fats. Check the ingredients list for added sugars, such as corn syrup. Shopping  At the grocery store, buy most of your food from the areas near the walls of the store. This includes: Fresh fruits and vegetables (produce). Grains, beans, nuts, and seeds. Some of these may be available in unpackaged forms or large amounts (in bulk). Fresh seafood. Poultry and eggs. Low-fat dairy products. Buy whole ingredients instead of prepackaged foods. Buy fresh fruits and vegetables in-season from local farmers markets. Buy frozen fruits and vegetables in resealable bags. If you do not have access to quality fresh seafood, buy precooked frozen shrimp or canned fish, such as tuna, salmon, or sardines. Buy small amounts of raw or cooked vegetables, salads, or olives from the deli or salad bar at your store. Stock your pantry so you always have certain foods on hand, such as olive oil, canned tuna, canned tomatoes, rice, pasta, and beans. Cooking  Cook foods with extra-virgin olive oil instead of using butter or other vegetable oils. Have meat as a side dish, and have vegetables or grains as your main dish. This means having meat in small portions or adding small amounts of meat to foods like pasta or stew. Use beans or vegetables instead of meat in common dishes like chili or lasagna. Experiment with different cooking methods. Try roasting or broiling vegetables instead of steaming or sauteing them. Add frozen vegetables to soups, stews, pasta, or rice. Add nuts or seeds for added healthy fat at each meal. You can add these to yogurt, salads, or vegetable dishes. Marinate fish or vegetables using olive oil, lemon juice, garlic, and fresh herbs. Meal planning  Plan to eat 1 vegetarian meal one day each week. Try to work up to 2 vegetarian meals, if possible. Eat seafood 2 or more times a week. Have healthy snacks readily available, such  as: Vegetable sticks with hummus. Greek yogurt. Fruit and nut trail mix. Eat balanced meals throughout the week. This includes: Fruit: 2-3 servings a day Vegetables: 4-5 servings a day Low-fat dairy: 2 servings a day Fish, poultry, or lean meat: 1 serving a day Beans and legumes: 2 or more servings a week Nuts and seeds: 1-2 servings a day Whole grains: 6-8 servings a day Extra-virgin olive oil: 3-4 servings a day Limit red meat and sweets to only a few servings a month What are my food choices? Mediterranean diet Recommended Grains: Whole-grain pasta. Brown rice. Bulgar wheat. Polenta. Couscous. Whole-wheat bread. [place]. Vegetables: Artichokes. Beets. Broccoli. Cabbage. Carrots. Eggplant. Green beans. Chard. Kale. Spinach. Onions. Leeks. Peas. Squash. Tomatoes. Peppers. Radishes. Fruits: Apples. Apricots. Avocado. Berries. Bananas. Cherries. Dates. Figs. Grapes. Lemons. Melon. Oranges. Peaches. Plums. Pomegranate. Meats and other protein foods: Beans. Almonds. Sunflower seeds. Pine nuts. Peanuts. [place]. Salmon. Scallops. Shrimp. [place]. Tilapia. Clams. Oysters. Eggs. Dairy: Low-fat milk. Cheese. Greek yogurt. Beverages: Water. Red wine. Herbal tea. Fats and oils: Extra virgin olive oil. Avocado oil. Grape seed oil. Sweets and desserts: [place] yogurt with honey. Baked apples. Poached pears. Trail mix. Seasoning and other foods: Basil. Cilantro. Coriander. Cumin. Mint. Parsley. Sage.  Rosemary. Tarragon. Garlic. Oregano. Thyme. Pepper. Balsalmic vinegar. Tahini. Hummus. Tomato sauce. Olives. Mushrooms. Limit these Grains: Prepackaged pasta or rice dishes. Prepackaged cereal with added sugar. Vegetables: Deep fried potatoes (french fries). Fruits: Fruit canned in syrup. Meats and other protein foods: Beef. Pork. Lamb. Poultry with skin. Hot dogs. [place] Salines. Dairy: Ice cream. Sour cream. Whole milk. Beverages: Juice. Sugar-sweetened soft drinks. Beer. Liquor and spirits. Fats and oils:  Butter. Canola oil. Vegetable oil. Beef fat (tallow). Lard. Sweets and desserts: Cookies. Cakes. Pies. Candy. Seasoning and other foods: Mayonnaise. Premade sauces and marinades. The items listed may not be a complete list. Talk with your dietitian about what dietary choices are right for you. Summary The Mediterranean diet includes both food and lifestyle choices. Eat a variety of fresh fruits and vegetables, beans, nuts, seeds, and whole grains. Limit the amount of red meat and sweets that you eat. Talk with your health care provider about whether it is safe for you to drink red wine in moderation. This means 1 glass a day for nonpregnant women and 2 glasses a day for men. A glass of wine equals 5 oz (150 mL). This information is not intended to replace advice given to you by your health care provider. Make sure you discuss any questions you have with your health care provider. Document Released: 09/29/2015 Document Revised: 11/01/2015 Document Reviewed: 09/29/2015 Elsevier Interactive Patient Education  2017 [employer].

## 2021-10-05 ENCOUNTER — Encounter: Payer: Self-pay | Admitting: Internal Medicine

## 2021-10-05 NOTE — Progress Notes (Signed)
Left message to call at 8:48am 10/05/2021

## 2021-10-05 NOTE — Progress Notes (Signed)
Please inform the patient that the vitamin B12 is very low at 183, would like the patient to start on B12 supplementation at 1000 mcg daily and follow-up with primary care physician.  The thyroid levels are normal.  Thank you

## 2021-10-10 ENCOUNTER — Institutional Professional Consult (permissible substitution): Payer: TRICARE For Life (TFL) | Admitting: Internal Medicine

## 2021-10-10 ENCOUNTER — Other Ambulatory Visit: Payer: TRICARE For Life (TFL)

## 2021-10-10 DIAGNOSIS — Z79899 Other long term (current) drug therapy: Secondary | ICD-10-CM | POA: Diagnosis not present

## 2021-10-10 DIAGNOSIS — I441 Atrioventricular block, second degree: Secondary | ICD-10-CM

## 2021-10-10 DIAGNOSIS — N1831 Chronic kidney disease, stage 3a: Secondary | ICD-10-CM | POA: Diagnosis not present

## 2021-10-10 LAB — BASIC METABOLIC PANEL
BUN/Creatinine Ratio: 16 (ref 10–24)
BUN: 28 mg/dL — ABNORMAL HIGH (ref 8–27)
CO2: 25 mmol/L (ref 20–29)
Calcium: 9.1 mg/dL (ref 8.6–10.2)
Chloride: 103 mmol/L (ref 96–106)
Creatinine, Ser: 1.79 mg/dL — ABNORMAL HIGH (ref 0.76–1.27)
Glucose: 143 mg/dL — ABNORMAL HIGH (ref 70–99)
Potassium: 4.6 mmol/L (ref 3.5–5.2)
Sodium: 143 mmol/L (ref 134–144)
eGFR: 36 mL/min/{1.73_m2} — ABNORMAL LOW (ref >59)

## 2021-10-14 ENCOUNTER — Ambulatory Visit
Admission: RE | Admit: 2021-10-14 | Discharge: 2021-10-14 | Disposition: A | Discharge status: Home / Self Care | Payer: TRICARE For Life (TFL) | Source: Ambulatory Visit | Attending: Physician Assistant | Admitting: Physician Assistant

## 2021-10-14 DIAGNOSIS — I639 Cerebral infarction, unspecified: Secondary | ICD-10-CM | POA: Diagnosis not present

## 2021-10-14 DIAGNOSIS — R413 Other amnesia: Secondary | ICD-10-CM | POA: Diagnosis not present

## 2021-10-18 NOTE — Progress Notes (Signed)
MRI brain shows some loss of volume in the brain, mild changes in the vessels and remote chronic mini strokes in the cerebellum but there is nothing new. MAke sure to have good control of blood pressure, sugars and cholesterol . Continue baby aspirin, thanks

## 2021-10-28 DIAGNOSIS — J439 Emphysema, unspecified: Secondary | ICD-10-CM | POA: Diagnosis not present

## 2021-11-15 ENCOUNTER — Encounter: Payer: Self-pay | Admitting: Physician Assistant

## 2021-11-15 ENCOUNTER — Ambulatory Visit (INDEPENDENT_AMBULATORY_CARE_PROVIDER_SITE_OTHER): Payer: Medicare Other | Admitting: Physician Assistant

## 2021-11-15 VITALS — Resp 20 | Ht 71.0 in | Wt 150.0 lb

## 2021-11-15 DIAGNOSIS — G309 Alzheimer's disease, unspecified: Secondary | ICD-10-CM

## 2021-11-15 DIAGNOSIS — F0150 Vascular dementia without behavioral disturbance: Secondary | ICD-10-CM | POA: Diagnosis not present

## 2021-11-15 DIAGNOSIS — F0280 Dementia in other diseases classified elsewhere without behavioral disturbance: Secondary | ICD-10-CM

## 2021-11-15 MED ORDER — MEMANTINE HCL 5 MG PO TABS
ORAL_TABLET | ORAL | 11 refills | Status: DC
Start: 2021-11-15 — End: 2022-01-25

## 2021-11-15 NOTE — Progress Notes (Signed)
Assessment/Plan:   Mixed Alzheimer's and Vascular Dementia   Brian [name] is a very pleasant 88 y.o. RH male  with  a history of hypertension, hyperlipidemia, history of bradycardia, CAD, COPD, DM 2, anxiety, depression and bradycardia seen today in follow up to discuss the 8/20203 MRI results, personally reviewed, remarkable for generalized parenchymal volume loss. Mild chronic microvascular ischemic changes and several chronic cerebellar infarcts. Last MoCA on 10/04/21 was 16/30. Patient is not on antidementia medication "wanted to wait till the MRI results". He declines neurocognitive testing at this time.      Follow up in  6 months. Start memantine 5 mg, take 1 tab qhs for 2 weeks, then increase to 5 mg bid if tolerated, side effects discussed  (bradycardia) Recommend good control of cardiovascular risk factors; follow with Cards.    Recommend starting on vitamin B12 1000 mcg daily.  Follow-up with PCP.    Continue to control mood as per PCP, he is to have an appt for mental health at the [place] soon for anxiety/panic attacks      Subjective:    This patient is accompanied in the office by his son who supplements the history.  Previous records as well as any outside records available were reviewed prior to todays visit.  He was last seen on 10/04/21  Any changes in memory since last visit? "Memory is about the same".       Pertinent labs 09/2021 1.77, B12 low at 183,  A1C6.7  MRI brain 10/16/21 personally reviewed was remarkable for generalized parenchymal volume loss. Mild chronic microvascular ischemic changes and several chronic cerebellar infarcts.    CURRENT MEDICATIONS:  Outpatient Encounter Medications as of 11/15/2021  Medication Sig   ACCU-CHEK COMPACT STRIPS test strip TEST BLOOD SUGAR ONCE DAILY   acetaminophen (TYLENOL) 500 MG tablet Take 500 mg by mouth every 6 (six) hours as needed for mild pain.   albuterol (VENTOLIN HFA) 108 (90 Base) MCG/ACT inhaler Inhale 2  puffs into the lungs every 6 (six) hours as needed for wheezing or shortness of breath.   ANORO ELLIPTA 62.5-25 MCG/ACT AEPB USE 1 INHALATION DAILY (Patient taking differently: Inhale 1 puff into the lungs daily.)   Aspirin 81 MG EC tablet Take 81 mg by mouth daily.   cholecalciferol (VITAMIN D) 1000 UNITS tablet Take 3,000 Units by mouth daily.    diazepam (VALIUM) 2 MG tablet Take 1 tab 30 mins prior to the procedure, may take an additional tab as needed   enalapril (VASOTEC) 10 MG tablet TAKE 1 TABLET TWICE A DAY (Patient taking differently: Take 10 mg by mouth 2 (two) times daily.)   escitalopram (LEXAPRO) 5 MG tablet Take 1 tablet (5 mg total) by mouth every morning.   furosemide (LASIX) 40 MG tablet Take 1 tablet (40 mg total) by mouth daily.   Magnesium Oxide 420 MG TABS Take 420 mg by mouth daily.    mirtazapine (REMERON) 15 MG tablet Take 1 tablet (15 mg total) by mouth at bedtime.   Multiple Vitamins-Minerals (MULTIVITAL PO) Take 1 tablet by mouth daily.    Polyethyl Glycol-Propyl Glycol (SYSTANE) 0.4-0.3 % SOLN Place 1 drop into both eyes daily.   simvastatin (ZOCOR) 40 MG tablet TAKE 1 TABLET DAILY (Patient taking differently: Take 40 mg by mouth daily.)   temazepam (RESTORIL) 7.5 MG capsule Take 1-2 capsules (7.5-15 mg total) by mouth at bedtime as needed for sleep.   triamcinolone cream (KENALOG) 0.1 % Apply 1 application topically daily  as needed (Rash).   No facility-administered encounter medications on file as of 11/15/2021.        No data to display            10/04/2021    2:00 PM  Montreal Cognitive Assessment   Visuospatial/ Executive (0/5) 2  Naming (0/3) 2  Attention: Read list of digits (0/2) 1  Attention: Read list of letters (0/1) 1  Attention: Serial 7 subtraction starting at 100 (0/3) 1  Language: Repeat phrase (0/2) 0  Language : Fluency (0/1) 1  Abstraction (0/2) 1  Delayed Recall (0/5) 1  Orientation (0/6) 6  Total 16  Adjusted Score (based on  education) 16     Thank you for allowing [place] the opportunity to participate in the care of this nice patient. Please do not hesitate to contact us for any questions or concerns.   Total time spent on today's visit was 34 minutes dedicated to this patient today, preparing to see patient, examining the patient, ordering tests and/or medications and counseling the patient, documenting clinical information in the EHR or other health record, independently interpreting results and communicating results to the patient/family, discussing treatment and goals, answering patient's questions and coordinating care.  Cc:  Binnie Rail, MD  Sharene Butters 11/15/2021 6:56 AM

## 2021-11-15 NOTE — Patient Instructions (Addendum)
It was a pleasure to see you today at our office.   Recommendations:  Follow up in 6 months Start memantine 5 mg, take 1 tab at night for 2 weeks, then increase to 5 mg twice daily   Recommend good control of cardiovascular risk factors.    Recommend starting on vitamin B12 1000 mcg daily.  Follow-up with primary doctor Continue to control mood as per PCP and possible mental health evaluation     Whom to call:  Memory  decline, memory medications: Call our office [phone]   For psychiatric meds, mood meds: Please have your primary care physician manage these medications.   Counseling regarding caregiver distress, including caregiver depression, anxiety and issues regarding community resources, adult day care programs, adult living facilities, or memory care questions:   Feel free to contact [place], Social Worker at [phone]   For assessment of decision of mental capacity and competency:  Call Dr. Anthoney Harada, geriatric psychiatrist at [phone]   For guidance regarding [facility] and if placement were needed at the facility, contact Arnell Asal, Social Worker tel: [phone]  If you have any severe symptoms of a stroke, or other severe issues such as confusion,severe chills or fever, etc call 911 or go to the ER as you may need to be evaluated further    We have sent a referral to [facility] for your MRI and they will call you directly to schedule your appointment. They are located at [place]. If you need to contact them directly please call [phone].  Your provider has requested that you have labwork completed today. Please go to [facility] ([id number]) on the second floor of this building before leaving the office today. You do not need to check in. If you are not called within 15 minutes please check with the front desk.   Feel free to visit Facebook page " [email or web address]" for tips of how to care for  people with memory problems.      RECOMMENDATIONS FOR ALL PATIENTS WITH MEMORY PROBLEMS: 1. Continue to exercise (Recommend 30 minutes of walking everyday, or 3 hours every week) 2. Increase social interactions - continue going to [place] and enjoy social gatherings with friends and family 3. Eat healthy, avoid fried foods and eat more fruits and vegetables 4. Maintain adequate blood pressure, blood sugar, and blood cholesterol level. Reducing the risk of stroke and cardiovascular disease also helps promoting better memory. 5. Avoid stressful situations. Live a simple life and avoid aggravations. Organize your time and prepare for the next day in anticipation. 6. Sleep well, avoid any interruptions of sleep and avoid any distractions in the bedroom that may interfere with adequate sleep quality 7. Avoid sugar, avoid sweets as there is a strong link between excessive sugar intake, diabetes, and cognitive impairment We discussed the Mediterranean diet, which has been shown to help patients reduce the risk of progressive memory disorders and reduces cardiovascular risk. This includes eating fish, eat fruits and green leafy vegetables, nuts like almonds and hazelnuts, walnuts, and also use olive oil. Avoid fast foods and fried foods as much as possible. Avoid sweets and sugar as sugar use has been linked to worsening of memory function.  There is always a concern of gradual progression of memory problems. If this is the case, then we may need to adjust level of care according to patient needs. Support, both to the patient and caregiver, should then be put into place.  The Alzheimer's Association is here all day, every day for people facing Alzheimer's disease through our free 24/7 Helpline: [phone]. The Helpline provides reliable information and support to all those who need assistance, such as individuals living with memory loss, Alzheimer's or other dementia, caregivers, health care  professionals and the public.  Our highly trained and knowledgeable staff can help you with: Understanding memory loss, dementia and Alzheimer's  Medications and other treatment options  General information about aging and brain health  Skills to provide quality care and to find the best care from professionals  Legal, financial and living-arrangement decisions Our Helpline also features: Confidential care consultation provided by master's level clinicians who can help with decision-making support, crisis assistance and education on issues families face every day  Help in a caller's preferred language using our translation service that features more than 200 languages and dialects  Referrals to local community programs, services and ongoing support     FALL PRECAUTIONS: Be cautious when walking. Scan the area for obstacles that may increase the risk of trips and falls. When getting up in the mornings, sit up at the edge of the bed for a few minutes before getting out of bed. Consider elevating the bed at the head end to avoid drop of blood pressure when getting up. Walk always in a well-lit room (use night lights in the walls). Avoid area rugs or power cords from appliances in the middle of the walkways. Use a walker or a cane if necessary and consider physical therapy for balance exercise. Get your eyesight checked regularly.  FINANCIAL OVERSIGHT: Supervision, especially oversight when making financial decisions or transactions is also recommended.  HOME SAFETY: Consider the safety of the kitchen when operating appliances like stoves, microwave oven, and blender. Consider having supervision and share cooking responsibilities until no longer able to participate in those. Accidents with firearms and other hazards in the house should be identified and addressed as well.   ABILITY TO BE LEFT ALONE: If patient is unable to contact 911 operator, consider using LifeLine, or when the need is there,  arrange for someone to stay with patients. Smoking is a fire hazard, consider supervision or cessation. Risk of wandering should be assessed by caregiver and if detected at any point, supervision and safe proof recommendations should be instituted.  MEDICATION SUPERVISION: Inability to self-administer medication needs to be constantly addressed. Implement a mechanism to ensure safe administration of the medications.   DRIVING: Regarding driving, in patients with progressive memory problems, driving will be impaired. We advise to have someone else do the driving if trouble finding directions or if minor accidents are reported. Independent driving assessment is available to determine safety of driving.   If you are interested in the driving assessment, you can contact the following:  [employer] in [place]  [place] [phone] or [phone]      [place] refers to food and lifestyle choices that are based on the traditions of countries located on the [employer]. This way of eating has been shown to help prevent certain conditions and improve outcomes for people who have chronic diseases, like kidney disease and heart disease. What are tips for following this plan? Lifestyle  Cook and eat meals together with your family, when possible. Drink enough fluid to keep your urine clear or pale yellow. Be physically active every day. This includes: Aerobic exercise like running or  swimming. Leisure activities like gardening, walking, or housework. Get 7-8 hours of sleep each night. If recommended by your health care provider, drink red wine in moderation. This means 1 glass a day for nonpregnant women and 2 glasses a day for men. A glass of wine equals 5 oz (150 mL). Reading food labels  Check the serving size of packaged foods. For  foods such as rice and pasta, the serving size refers to the amount of cooked product, not dry. Check the total fat in packaged foods. Avoid foods that have saturated fat or trans fats. Check the ingredients list for added sugars, such as corn syrup. Shopping  At the grocery store, buy most of your food from the areas near the walls of the store. This includes: Fresh fruits and vegetables (produce). Grains, beans, nuts, and seeds. Some of these may be available in unpackaged forms or large amounts (in bulk). Fresh seafood. Poultry and eggs. Low-fat dairy products. Buy whole ingredients instead of prepackaged foods. Buy fresh fruits and vegetables in-season from local farmers markets. Buy frozen fruits and vegetables in resealable bags. If you do not have access to quality fresh seafood, buy precooked frozen shrimp or canned fish, such as tuna, salmon, or sardines. Buy small amounts of raw or cooked vegetables, salads, or olives from the deli or salad bar at your store. Stock your pantry so you always have certain foods on hand, such as olive oil, canned tuna, canned tomatoes, rice, pasta, and beans. Cooking  Cook foods with extra-virgin olive oil instead of using butter or other vegetable oils. Have meat as a side dish, and have vegetables or grains as your main dish. This means having meat in small portions or adding small amounts of meat to foods like pasta or stew. Use beans or vegetables instead of meat in common dishes like chili or lasagna. Experiment with different cooking methods. Try roasting or broiling vegetables instead of steaming or sauteing them. Add frozen vegetables to soups, stews, pasta, or rice. Add nuts or seeds for added healthy fat at each meal. You can add these to yogurt, salads, or vegetable dishes. Marinate fish or vegetables using olive oil, lemon juice, garlic, and fresh herbs. Meal planning  Plan to eat 1 vegetarian meal one day each week. Try to work up to 2  vegetarian meals, if possible. Eat seafood 2 or more times a week. Have healthy snacks readily available, such as: Vegetable sticks with hummus. Greek yogurt. Fruit and nut trail mix. Eat balanced meals throughout the week. This includes: Fruit: 2-3 servings a day Vegetables: 4-5 servings a day Low-fat dairy: 2 servings a day Fish, poultry, or lean meat: 1 serving a day Beans and legumes: 2 or more servings a week Nuts and seeds: 1-2 servings a day Whole grains: 6-8 servings a day Extra-virgin olive oil: 3-4 servings a day Limit red meat and sweets to only a few servings a month What are my food choices? Mediterranean diet Recommended Grains: Whole-grain pasta. Brown rice. Bulgar wheat. Polenta. Couscous. Whole-wheat bread. [place]. Vegetables: Artichokes. Beets. Broccoli. Cabbage. Carrots. Eggplant. Green beans. Chard. Kale. Spinach. Onions. Leeks. Peas. Squash. Tomatoes. Peppers. Radishes. Fruits: Apples. Apricots. Avocado. Berries. Bananas. Cherries. Dates. Figs. Grapes. Lemons. Melon. Oranges. Peaches. Plums. Pomegranate. Meats and other protein foods: Beans. Almonds. Sunflower seeds. Pine nuts. Peanuts. [place]. Salmon. Scallops. Shrimp. [place]. Tilapia. Clams. Oysters. Eggs. Dairy: Low-fat milk. Cheese. Greek yogurt. Beverages: Water. Red wine. Herbal tea. Fats and oils: Extra virgin olive oil. Avocado oil.  Grape seed oil. Sweets and desserts: [place] yogurt with honey. Baked apples. Poached pears. Trail mix. Seasoning and other foods: Basil. Cilantro. Coriander. Cumin. Mint. Parsley. Sage. Rosemary. Tarragon. Garlic. Oregano. Thyme. Pepper. Balsalmic vinegar. Tahini. Hummus. Tomato sauce. Olives. Mushrooms. Limit these Grains: Prepackaged pasta or rice dishes. Prepackaged cereal with added sugar. Vegetables: Deep fried potatoes (french fries). Fruits: Fruit canned in syrup. Meats and other protein foods: Beef. Pork. Lamb. Poultry with skin. Hot dogs. [place] Salines. Dairy: Ice cream.  Sour cream. Whole milk. Beverages: Juice. Sugar-sweetened soft drinks. Beer. Liquor and spirits. Fats and oils: Butter. Canola oil. Vegetable oil. Beef fat (tallow). Lard. Sweets and desserts: Cookies. Cakes. Pies. Candy. Seasoning and other foods: Mayonnaise. Premade sauces and marinades. The items listed may not be a complete list. Talk with your dietitian about what dietary choices are right for you. Summary The Mediterranean diet includes both food and lifestyle choices. Eat a variety of fresh fruits and vegetables, beans, nuts, seeds, and whole grains. Limit the amount of red meat and sweets that you eat. Talk with your health care provider about whether it is safe for you to drink red wine in moderation. This means 1 glass a day for nonpregnant women and 2 glasses a day for men. A glass of wine equals 5 oz (150 mL). This information is not intended to replace advice given to you by your health care provider. Make sure you discuss any questions you have with your health care provider. Document Released: 09/29/2015 Document Revised: 11/01/2015 Document Reviewed: 09/29/2015 Elsevier Interactive Patient Education  2017 [employer].

## 2021-11-16 ENCOUNTER — Other Ambulatory Visit: Payer: No Typology Code available for payment source

## 2021-11-27 DIAGNOSIS — J439 Emphysema, unspecified: Secondary | ICD-10-CM | POA: Diagnosis not present

## 2021-11-30 ENCOUNTER — Ambulatory Visit: Payer: TRICARE For Life (TFL) | Admitting: Internal Medicine

## 2021-12-05 IMAGING — CT CT CHEST W/O CM
2 of 4 series · 15 of 36 positions shown, 18 images · non-contrast
Comparison: Multiple priors including most recent chest CT December 25, 2019 and PET-CT January 26, 2020.

CLINICAL DATA: Non-small cell lung cancer, non-metastatic, assess
treatment response

EXAM:
CT CHEST WITHOUT CONTRAST
TECHNIQUE: Multidetector CT imaging of the chest was performed following the
standard protocol without IV contrast.

[Series 2: thorax · axial · 0.96mm/px · z∈[+1307,+1631]mm · 12 of 190 slices shown, 15 images]
[im 14/190  mediastinal]
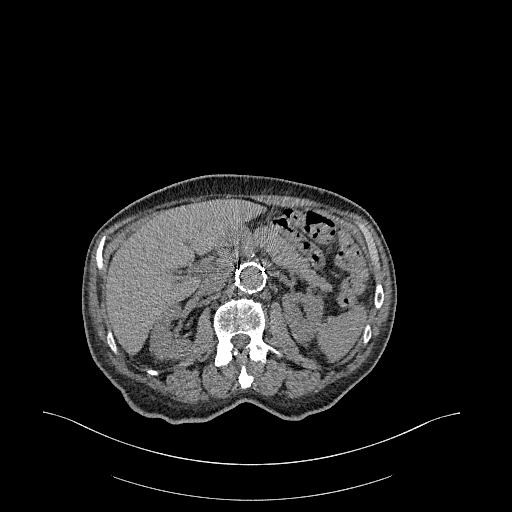
[im 14/190  lung]
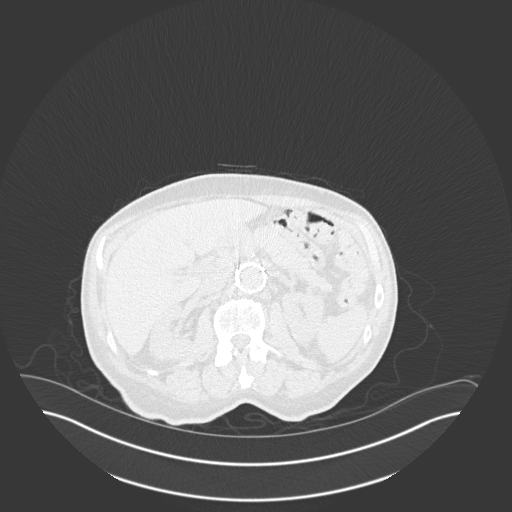
[im 28/190  lung]
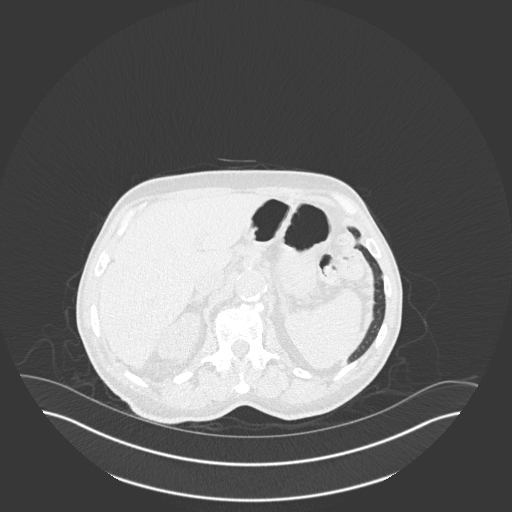
[im 41/190  lung]
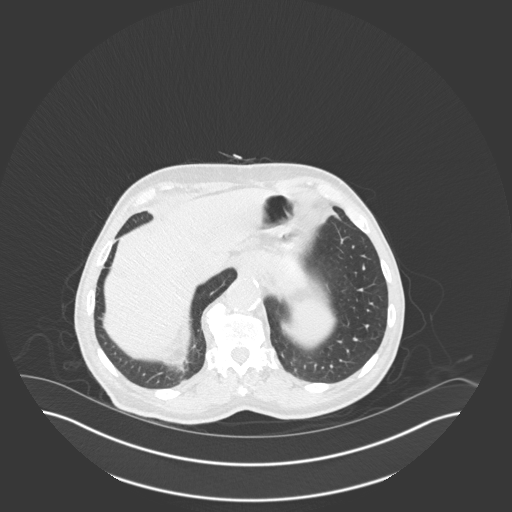
[im 55/190  lung]
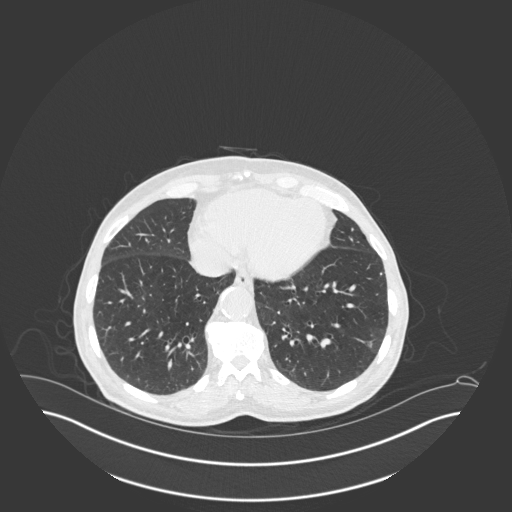
[im 68/190  mediastinal]
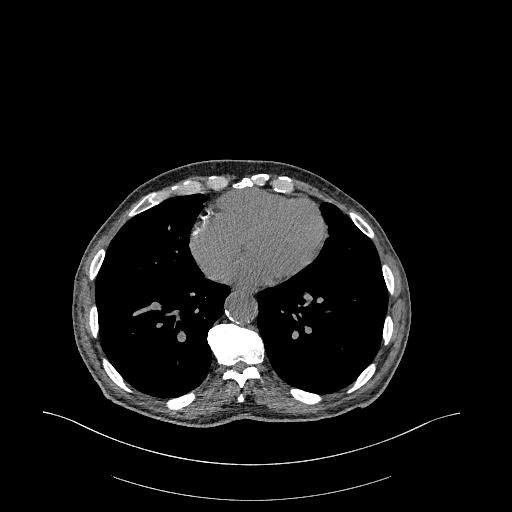
[im 68/190  lung]
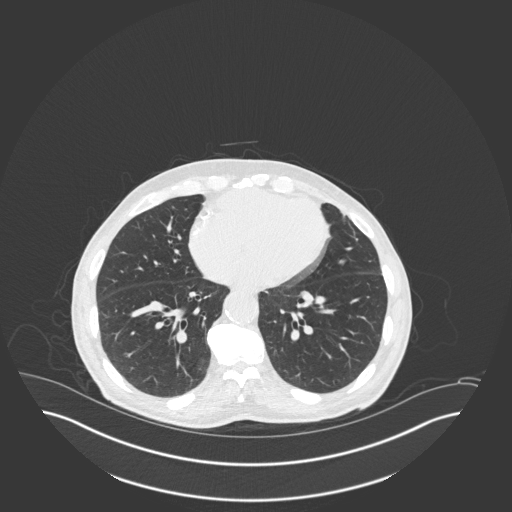
[im 82/190  lung]
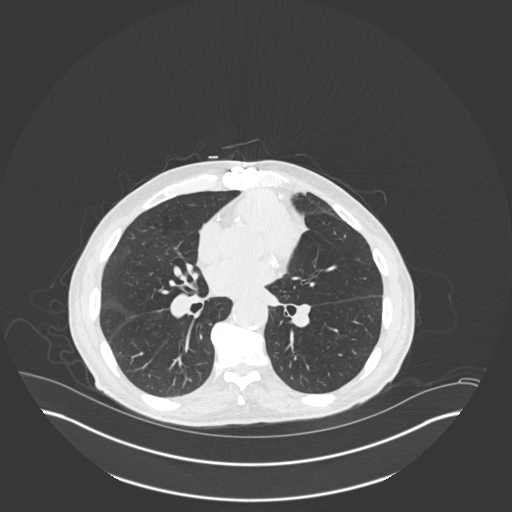
[im 109/190  lung]
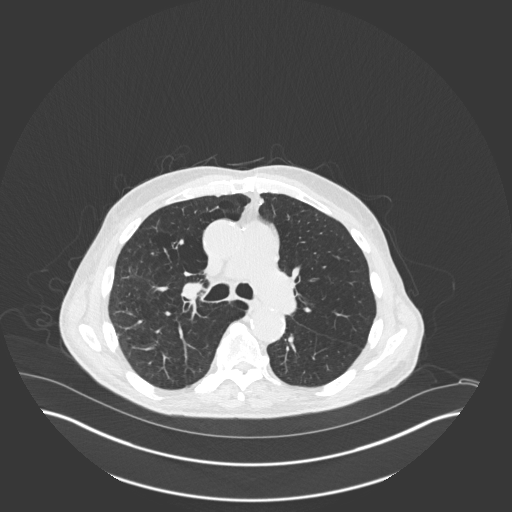
[im 122/190  lung]
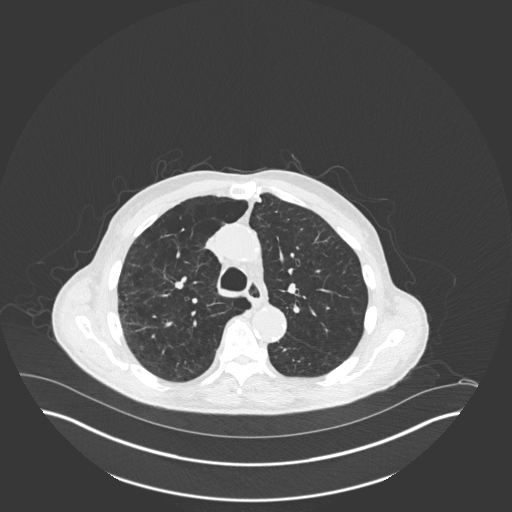
[im 136/190  mediastinal]
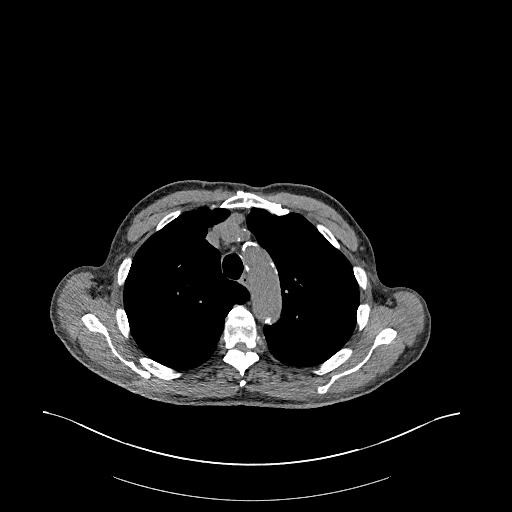
[im 136/190  lung]
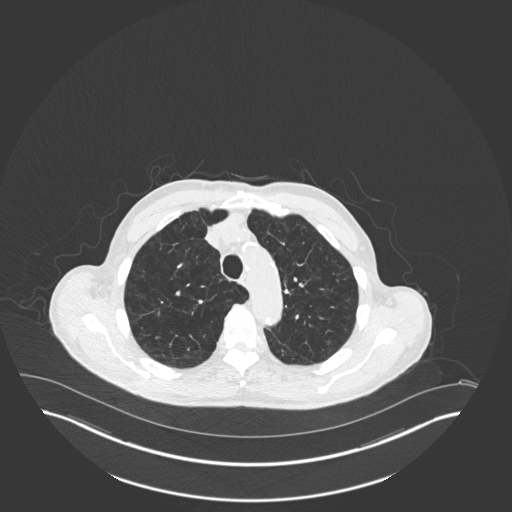
[im 149/190  lung]
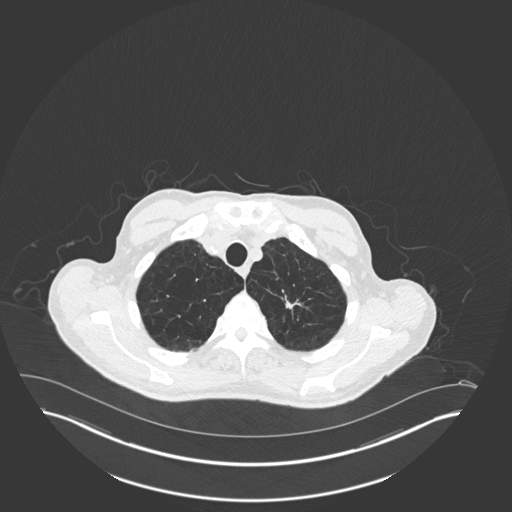
[im 163/190  lung]
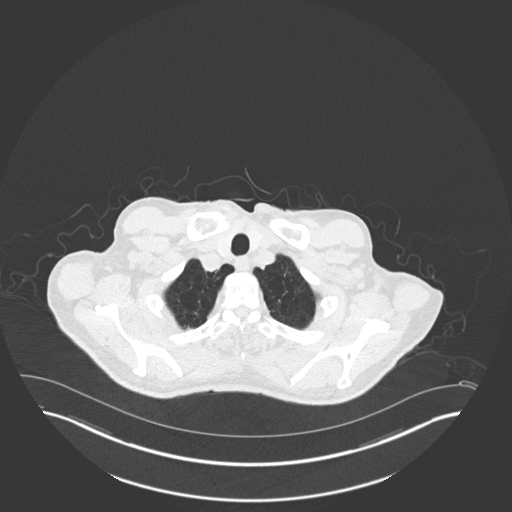
[im 176/190  lung]
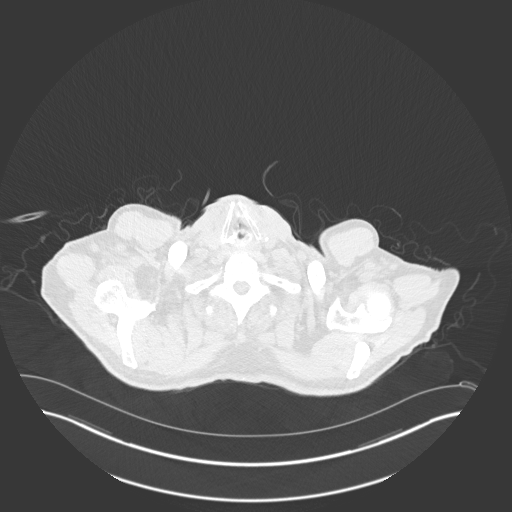

[Series 6: coronal · coronal · 0.74mm/px · 3 of 150 slices shown]
[im 30/150  lung]
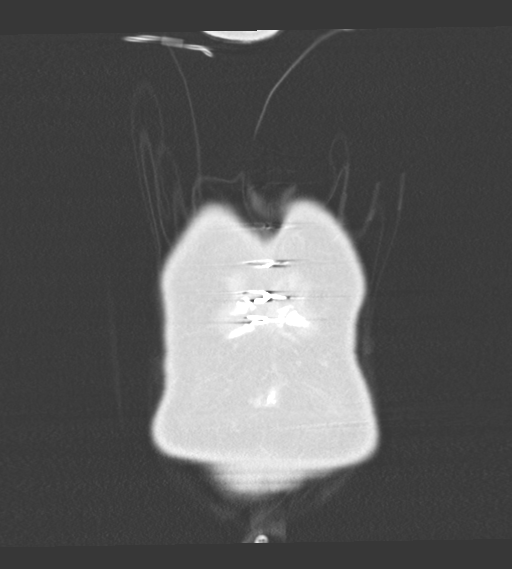
[im 60/150  lung]
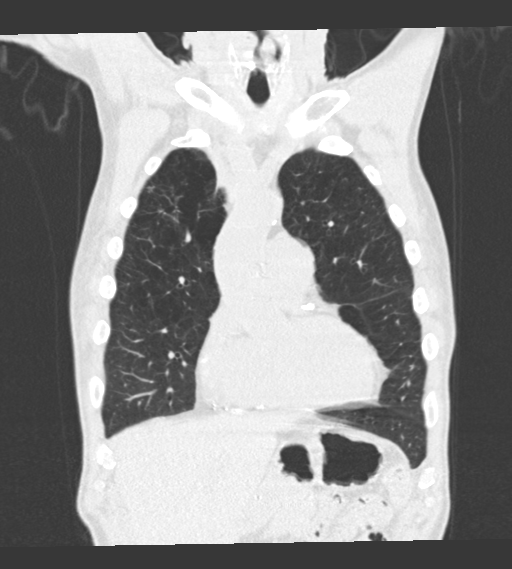
[im 90/150  lung]
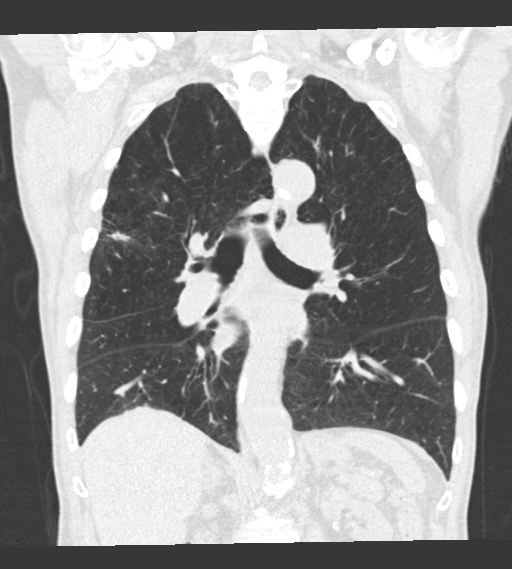

[15 of 36 positions shown; findings below may reference images not displayed]

FINDINGS: Cardiovascular: Aortic and branch vessel atherosclerosis without
aneurysmal dilation. Stable dilation of the main pulmonary artery
measuring 3.5 cm. Prior median sternotomy and CABG with dense
calcifications of the native coronary vessels. Normal size heart. No
significant pericardial effusion/thickening.

Mediastinum/Nodes: No discrete thyroid nodularity. No axillary
lymphadenopathy. Unchanged size of the mildly enlarged 1 cm
precarinal lymph node on image 77/2. No new pathologically enlarged
mediastinal lymph nodes. No discrete hilar adenopathy within the
constraints of noncontrast examination. The trachea and esophagus
are grossly unremarkable.

Lungs/Pleura: Severe centrilobular emphysema with mild diffuse
bronchial wall thickening. No pleural effusion. No pneumothorax. No
acute airspace consolidation.

Decreased size of the spiculated right upper lobe pulmonary nodule
now measuring 1.8 x 1.2 cm in maximum axial dimensions on image 76/5
previously 2.3 x 1.7 cm. No significant change in size of the
irregular solid left upper lobe pulmonary nodule which measures
x 0.7 cm in maximum axial dimension on image 42/5 previously 1.3 x
0.7 cm. No new suspicious pulmonary nodules or masses.

Upper Abdomen: Nonobstructive right upper pole renal stone.
Partially visualized aortic stent graft.

Musculoskeletal: No aggressive lytic or blastic lesion of bone.
Prior median sternotomy.
IMPRESSION: 1. Decreased size of the spiculated right upper lobe pulmonary
neoplasm.
2. Stable mildly paratracheal adenopathy, nonspecific. No evidence
of new or progressive disease involvement in the thorax.
3. Stable irregular solid left upper lobe pulmonary nodule which was
non hypermetabolic on PET-CT.
4. Severe centrilobular emphysema with mild diffuse bronchial wall
thickening.
5. Stable dilation of the main pulmonary artery, suggestive of
chronic pulmonary arterial hypertension.
6. Emphysema and aortic atherosclerosis.

Aortic Atherosclerosis ([id number]-[id number].D) and Emphysema ([id number].Z).

## 2021-12-19 ENCOUNTER — Encounter: Payer: Self-pay | Admitting: Internal Medicine

## 2021-12-26 NOTE — Progress Notes (Unsigned)
No chief complaint on file.  History of Present Illness: 88 yo male with history of HTN, hyperlipidemia, DM, COPD and CAD s/p CABG here today for cardiac follow up. He was admitted to [facility] January 2012 with a NSTEMI. Cardiac cath January 2012 with severe triple vessel CAD. He underwent 3V CABG on 03/03/10. EVAR December 2020 per DR. Cain for 5.5 cm AAA. Echo March 2017 with normal LV size and function, mild MR. He has been diagnosed with lung cancer and has been treated with XRT. He has chronic dyspnea and is now on continuous supplemental oxygen.   He is here today for follow up. The patient denies any chest pain, dyspnea, palpitations, lower extremity edema, orthopnea, PND, dizziness, near syncope or syncope.   Primary Care Physician: Binnie Rail, MD  Past Medical History:  Diagnosis Date   AAA (abdominal aortic aneurysm) ([place])    Arthritis    Asthma 1976   Bronchiolitis 06/2014   CAD (coronary artery disease)    NSTEMI 02/2010;  s/p CABG 1/12 (L-LAD, SOM1, S-PDA); echo in 08/2010: Mild LVH, EF 16%, grade 2 diastolic dysfunction, mild MR, mild LAE, mildly reduced RV function, mild RAE, PASP 42   Carpal tunnel syndrome of left wrist    Colon polyps    COPD (chronic obstructive pulmonary disease) ([place])    Diabetes mellitus 2000   Type II, controlls with diet   Dyspnea    ED (erectile dysfunction)    Gout    History of radiation therapy 03/09/2020-03/16/2020   SBRT right lung: Dr. Gery Pray   HTN (hypertension)    Hyperlipidemia    Inguinal hernia    Right   Macular degeneration    Myocardial infarction [facility]) 1 -13-2012   Neuropathy     Past Surgical History:  Procedure Laterality Date   3 vessel CABG  03/03/2010   ABDOMINAL AORTIC ENDOVASCULAR STENT GRAFT N/A 01/21/2019   Procedure: ABDOMINAL AORTIC ENDOVASCULAR STENT GRAFT;  Surgeon: Waynetta Sandy, MD;  Location: [place];  Service: Vascular;  Laterality: N/A;   cataract left eye     COLONOSCOPY      neg 2009   COLONOSCOPY  2014   5 mm sessile polyp; AVM   CORONARY ARTERY BYPASS GRAFT  2012   X3   ENDOVASCULAR REPAIR/STENT GRAFT  01/21/2019   ABDOMINAL AORTIC ENDOVASCULAR STENT GRAFT (N/A )   EYE SURGERY     Left cataract   HERNIA REPAIR     INCISION AND DRAINAGE PERIRECTAL ABSCESS     INGUINAL HERNIA REPAIR      Current Outpatient Medications  Medication Sig Dispense Refill   ACCU-CHEK COMPACT STRIPS test strip TEST BLOOD SUGAR ONCE DAILY 100 each 1   acetaminophen (TYLENOL) 500 MG tablet Take 500 mg by mouth every 6 (six) hours as needed for mild pain.     albuterol (VENTOLIN HFA) 108 (90 Base) MCG/ACT inhaler Inhale 2 puffs into the lungs every 6 (six) hours as needed for wheezing or shortness of breath.     ANORO ELLIPTA 62.5-25 MCG/ACT AEPB USE 1 INHALATION DAILY (Patient taking differently: Inhale 1 puff into the lungs daily.) 240 each 3   Aspirin 81 MG EC tablet Take 81 mg by mouth daily.     cholecalciferol (VITAMIN D) 1000 UNITS tablet Take 3,000 Units by mouth daily.      diazepam (VALIUM) 2 MG tablet Take 1 tab 30 mins prior to the procedure, may take an additional tab as needed  2 tablet 0   enalapril (VASOTEC) 10 MG tablet TAKE 1 TABLET TWICE A DAY (Patient taking differently: Take 10 mg by mouth 2 (two) times daily.) 180 tablet 3   escitalopram (LEXAPRO) 5 MG tablet Take 1 tablet (5 mg total) by mouth every morning. 30 tablet 5   furosemide (LASIX) 40 MG tablet Take 1 tablet (40 mg total) by mouth daily. 30 tablet 0   Magnesium Oxide 420 MG TABS Take 420 mg by mouth daily.      memantine (NAMENDA) 5 MG tablet Take 1 tablet (5 mg at night) for 2 weeks, then increase to 1 tablet ( 5 mg) twice a day 60 tablet 11   mirtazapine (REMERON) 15 MG tablet Take 1 tablet (15 mg total) by mouth at bedtime. 90 tablet 1   Multiple Vitamins-Minerals (MULTIVITAL PO) Take 1 tablet by mouth daily.      Polyethyl Glycol-Propyl Glycol (SYSTANE) 0.4-0.3 % SOLN Place 1 drop into both  eyes daily.     simvastatin (ZOCOR) 40 MG tablet TAKE 1 TABLET DAILY (Patient taking differently: Take 40 mg by mouth daily.) 90 tablet 3   temazepam (RESTORIL) 7.5 MG capsule Take 1-2 capsules (7.5-15 mg total) by mouth at bedtime as needed for sleep. 60 capsule 5   triamcinolone cream (KENALOG) 0.1 % Apply 1 application topically daily as needed (Rash).     No current facility-administered medications for this visit.    Allergies  Allergen Reactions   Meperidine Hcl     Tongue swelling Because of a history of documented adverse serious drug reaction;Medi Alert bracelet  is recommended   Sertraline Other (See Comments)    Burning sensation from feet to head, diff breathing   Gabapentin     confusion   Meperidine     Other reaction(s): Angioedema of tongue   Zolpidem     Other reaction(s): Nightmares   Zolpidem Tartrate Other (See Comments)    Nightmares    Social History   Socioeconomic History   Marital status: Widowed    Spouse name: Not on file   Number of children: 3   Years of education: 23   Highest education level: Not on file  Occupational History   Occupation: RETIRED    Employer: RETIRED  Tobacco Use   Smoking status: Former    Packs/day: 1.00    Years: 40.00    Total pack years: 40.00    Types: Cigarettes    Quit date: 02/20/2003    Years since quitting: 18.8   Smokeless tobacco: Never  Vaping Use   Vaping Use: Never used  Substance and Sexual Activity   Alcohol use: Not Currently    Alcohol/week: 6.0 standard drinks of alcohol    Types: 3 Glasses of wine, 3 Shots of liquor per week    Comment:  1-3 drinks a week   Drug use: No   Sexual activity: Not on file  Other Topics Concern   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    One story home   Right handed   3 children   Drinks caffeine   Social Determinants of Health   Financial Resource Strain: Low Risk  (08/24/2021)   Overall  Financial Resource Strain (CARDIA)    Difficulty of Paying Living Expenses: Not hard at all  Food Insecurity: No Food Insecurity (08/24/2021)   Hunger Vital Sign    Worried About Charity fundraiser in  the Last Year: Never true    [place] in the Last Year: Never true  Transportation Needs: No Transportation Needs (08/24/2021)   PRAPARE - Hydrologist (Medical): No    Lack of Transportation (Non-Medical): No  Physical Activity: Inactive (08/24/2021)   Exercise Vital Sign    Days of Exercise per Week: 0 days    Minutes of Exercise per Session: 0 min  Stress: No Stress Concern Present (08/24/2021)   [place]    Feeling of Stress : Not at all  Social Connections: Moderately Integrated (08/24/2021)   Social Connection and Isolation Panel [NHANES]    Frequency of Communication with Friends and Family: More than three times a week    Frequency of Social Gatherings with Friends and Family: More than three times a week    Attends Religious Services: More than 4 times per year    Active Member of [employer] or Organizations: Yes    Attends Archivist Meetings: More than 4 times per year    Marital Status: Widowed  Intimate Partner Violence: Not At Risk (08/24/2021)   Humiliation, Afraid, Rape, and Kick questionnaire    Fear of Current or Ex-Partner: No    Emotionally Abused: No    Physically Abused: No    Sexually Abused: No    Family History  Problem Relation Age of Onset   Heart failure Mother    Heart disease Mother        CHF   Heart attack Mother    Hypertension Mother    Heart attack Father    Heart disease Father 26       MI   Hypertension Father    Heart attack Sister 78   Heart disease Sister 28       MI   Diabetes Sister    Hyperlipidemia Sister    Hypertension Sister    Varicose Veins Sister    Heart attack Brother 1   Diabetes Brother    Heart disease Brother 71        MI   Cancer Brother    Hypertension Brother    Deep vein thrombosis Daughter    Leukemia Daughter    Other Son        varicose veins   Arthritis/Rheumatoid Son    Lupus Daughter    Colon cancer Neg Hx     Review of Systems:  As stated in the HPI and otherwise negative.   There were no vitals taken for this visit.  Physical Examination:  General: Well developed, well nourished, NAD  HEENT: OP clear, mucus membranes moist  SKIN: warm, dry. No rashes. Neuro: No focal deficits  Musculoskeletal: Muscle strength 5/5 all ext  Psychiatric: Mood and affect normal  Neck: No JVD, no carotid bruits, no thyromegaly, no lymphadenopathy.  Lungs:Clear bilaterally, no wheezes, rhonci, crackles Cardiovascular: Regular rate and rhythm. No murmurs, gallops or rubs. Abdomen:Soft. Bowel sounds present. Non-tender.  Extremities: No lower extremity edema. Pulses are 2 + in the bilateral DP/PT.  Echo March 2017: Left ventricle: The cavity size was normal. There was mild focal   basal hypertrophy of the septum. Systolic function was normal.   The estimated ejection fraction was in the range of 55% to 60%.   Wall motion was normal; there were no regional wall motion   abnormalities. Doppler parameters are consistent with abnormal   left ventricular relaxation (grade 1 diastolic dysfunction).  Doppler parameters are consistent with high ventricular filling   pressure. - Mitral valve: There was mild regurgitation. - Right ventricle: The cavity size was mildly dilated. - Right atrium: The atrium was mildly dilated.   Impressions: - Normal LV systolic function; grade 1 diastolic dysfunction; mild   MR; mild RAE and RVE; trace TR.  EKG:  EKG is  ordered today. The ekg ordered today demonstrates sinus, RBBB, LAFB, PACs vs 2nd Degree AV block type 1  Recent Labs: 09/12/2021: B Natriuretic Peptide 690.9 09/13/2021: ALT 15; Hemoglobin 10.1; Platelets 161 09/14/2021: Magnesium 2.1 10/04/2021: TSH  1.77 10/10/2021: BUN 28; Creatinine, Ser 1.79; Potassium 4.6; Sodium 143   Lipid Panel    Component Value Date/Time   CHOL 141 04/18/2021 1153   TRIG 80.0 04/18/2021 1153   HDL 53.90 04/18/2021 1153   CHOLHDL 3 04/18/2021 1153   VLDL 16.0 04/18/2021 1153   LDLCALC 71 04/18/2021 1153     Wt Readings from Last 3 Encounters:  11/15/21 150 lb (68 kg)  10/04/21 150 lb (68 kg)  10/02/21 151 lb 12.8 oz (68.9 kg)     Other studies Reviewed: Additional studies/ records that were reviewed today include: . Review of the above records demonstrates:    Assessment and Plan:   1. CAD s/p CABG without angina: He has no chest pain. Continue ASA, statin and beta blocker.   2. HTN: BP is controlled. No changes today  3. Hyperlipidemia: LDL at goal in March 2022. Continue statin.   4. AAA: s/p EVAR in 2020. Followed in [facility] office  Current medicines are reviewed at length with the patient today.  The patient does not have concerns regarding medicines.  The following changes have been made:  no change  Labs/ tests ordered today include:   No orders of the defined types were placed in this encounter.    Disposition:   F/U with me in 12  months  Signed, Lauree Chandler, MD 12/26/2021 5:47 PM    [facility] [address], [place], [place]  [address] Phone: [phone]; Fax: [phone]

## 2021-12-27 ENCOUNTER — Ambulatory Visit
Payer: No Typology Code available for payment source | Attending: Cardiovascular Disease | Admitting: Cardiovascular Disease

## 2021-12-27 ENCOUNTER — Encounter: Payer: Self-pay | Admitting: Cardiovascular Disease

## 2021-12-27 VITALS — BP 162/60 | HR 65 | Ht 71.0 in | Wt 152.8 lb

## 2021-12-27 DIAGNOSIS — I441 Atrioventricular block, second degree: Secondary | ICD-10-CM | POA: Diagnosis not present

## 2021-12-27 DIAGNOSIS — I2510 Atherosclerotic heart disease of native coronary artery without angina pectoris: Secondary | ICD-10-CM

## 2021-12-27 DIAGNOSIS — I714 Abdominal aortic aneurysm, without rupture, unspecified: Secondary | ICD-10-CM | POA: Diagnosis not present

## 2021-12-27 DIAGNOSIS — I10 Essential (primary) hypertension: Secondary | ICD-10-CM

## 2021-12-27 DIAGNOSIS — E7800 Pure hypercholesterolemia, unspecified: Secondary | ICD-10-CM | POA: Diagnosis not present

## 2021-12-27 MED ORDER — FUROSEMIDE 40 MG PO TABS: 40.0000 mg | ORAL_TABLET | Freq: Every day | ORAL | 3 refills | Status: DC

## 2021-12-27 NOTE — Patient Instructions (Signed)
Medication Instructions:  Get back on lasix 40 mg - daily as needed  *If you need a refill on your cardiac medications before your next appointment, please call your pharmacy*   Lab Work: none   Testing/Procedures: none   Follow-Up: At [facility], you and your health needs are our priority.  As part of our continuing mission to provide you with exceptional heart care, we have created designated Provider Care Teams.  These Care Teams include your primary Cardiologist (physician) and Advanced Practice Providers (APPs -  Physician Assistants and Nurse Practitioners) who all work together to provide you with the care you need, when you need it.   Your next appointment:   6 month(s)  The format for your next appointment:   In Person  Provider:   Lauree Chandler, MD     Important Information About Sugar

## 2021-12-28 DIAGNOSIS — J439 Emphysema, unspecified: Secondary | ICD-10-CM | POA: Diagnosis not present

## 2022-01-03 ENCOUNTER — Encounter: Payer: Self-pay | Admitting: Internal Medicine

## 2022-01-10 ENCOUNTER — Other Ambulatory Visit: Payer: Self-pay | Admitting: Internal Medicine

## 2022-01-10 ENCOUNTER — Ambulatory Visit (HOSPITAL_COMMUNITY)
Admission: RE | Admit: 2022-01-10 | Discharge: 2022-01-10 | Disposition: A | Discharge status: Home / Self Care | Payer: Medicare Other | Source: Ambulatory Visit | Attending: Radiation Oncology | Admitting: Radiation Oncology

## 2022-01-10 DIAGNOSIS — R918 Other nonspecific abnormal finding of lung field: Secondary | ICD-10-CM | POA: Diagnosis not present

## 2022-01-10 DIAGNOSIS — J439 Emphysema, unspecified: Secondary | ICD-10-CM | POA: Diagnosis not present

## 2022-01-10 DIAGNOSIS — J90 Pleural effusion, not elsewhere classified: Secondary | ICD-10-CM | POA: Diagnosis not present

## 2022-01-10 DIAGNOSIS — C3490 Malignant neoplasm of unspecified part of unspecified bronchus or lung: Secondary | ICD-10-CM | POA: Diagnosis not present

## 2022-01-15 MED ORDER — ALBUTEROL SULFATE HFA 108 (90 BASE) MCG/ACT IN AERS: 2.0000 | INHALATION_SPRAY | Freq: Four times a day (QID) | RESPIRATORY_TRACT | 0 refills | Status: DC | PRN

## 2022-01-17 NOTE — Progress Notes (Signed)
Radiation Oncology         [phone] ________________________________  Name: Brian [name] MRN: [id number]  Date: 01/18/2022  DOB: 10/11/1931  Follow-Up Visit Note  CC: Binnie Rail, MD  Collene Gobble, MD  No diagnosis found.  Diagnosis: Pet positive Right upper lobe lung nodule, suspicious for primary bronchogenic carcinoma   Interval Since Last Radiation: 1 year, 10 months, and 4 days   Radiation Treatment Dates: 03/09/2020 through 03/16/2020   Site: Right lung Technique: SBRT Total Dose (Gy): 54/54 Dose per Fx (Gy): 18 Completed Fx: 3/3 Beam Energies: 6XFFF  Narrative:  The patient returns today for routine follow-up and to review recent imaging, he was last seen here for follow-up on 07/13/21.     In the interval, the patient was hospitalized for management of acute CHF from 09/12/21 through 09/14/21. On presentation, the patient reported worsening dyspnea and hypoxemia despite supplemental O2 use. Physical exam in the ED noted bilateral rales with decreased breath sounds at the right base, and trace left lower extremity edema. Work-up included: vascular LE [place] which was negative for DVT, chest CT showing a right pleural effusion and diffuse emphysema without evidence of PE, and echocardiogram showing preserved LV systolic function, LV EF 60 to 65% with severe asymmetric left ventricular hypertrophy of the septal segment, and mild mitral valve prolapse. Hospital course included IV furosemide for diuresis with significant improvement in his symptoms.   His most recent chest CT without contrast on 01/10/22 shows an interval decrease in size of the RUL nodule, which now measures 6 x 5 mm. CT also shows stability of the 6 mm left apical nodule, a minimal increase in conspicuity of a 3 mm LUL nodule, a small right pleural effusion, and enlargement of the main pulmonary arteries suggestive of pulmonary arterial hypertension.    Of note: The patient had an MRI of the brain  performed on 10/14/21 for evaluation of memory loss which showed no evidence of recent infarction, hemorrhage, or mass.      ***                 Allergies:  is allergic to meperidine hcl, sertraline, gabapentin, meperidine, zolpidem, and zolpidem tartrate.  Meds: Current Outpatient Medications  Medication Sig Dispense Refill   ACCU-CHEK COMPACT STRIPS test strip TEST BLOOD SUGAR ONCE DAILY 100 each 1   acetaminophen (TYLENOL) 500 MG tablet Take 500 mg by mouth every 6 (six) hours as needed for mild pain.     albuterol (VENTOLIN HFA) 108 (90 Base) MCG/ACT inhaler Inhale 2 puffs into the lungs every 6 (six) hours as needed for wheezing or shortness of breath. 18 g 0   ANORO ELLIPTA 62.5-25 MCG/ACT AEPB USE 1 INHALATION DAILY (Patient taking differently: Inhale 1 puff into the lungs daily.) 240 each 3   Aspirin 81 MG EC tablet Take 81 mg by mouth daily.     cholecalciferol (VITAMIN D) 1000 UNITS tablet Take 3,000 Units by mouth daily.      enalapril (VASOTEC) 10 MG tablet TAKE 1 TABLET TWICE A DAY (Patient taking differently: Take 10 mg by mouth 2 (two) times daily.) 180 tablet 3   escitalopram (LEXAPRO) 5 MG tablet Take 1 tablet (5 mg total) by mouth every morning. 30 tablet 5   furosemide (LASIX) 40 MG tablet Take 1 tablet (40 mg total) by mouth daily. 90 tablet 3   Magnesium Oxide 420 MG TABS Take 420 mg by mouth daily.  memantine (NAMENDA) 5 MG tablet Take 1 tablet (5 mg at night) for 2 weeks, then increase to 1 tablet ( 5 mg) twice a day 60 tablet 11   mirtazapine (REMERON) 15 MG tablet Take 1 tablet (15 mg total) by mouth at bedtime. 90 tablet 1   Multiple Vitamins-Minerals (MULTIVITAL PO) Take 1 tablet by mouth daily.      Polyethyl Glycol-Propyl Glycol (SYSTANE) 0.4-0.3 % SOLN Place 1 drop into both eyes daily.     simvastatin (ZOCOR) 40 MG tablet TAKE 1 TABLET DAILY (Patient taking differently: Take 40 mg by mouth daily.) 90 tablet 3   triamcinolone cream (KENALOG) 0.1 % Apply 1  application topically daily as needed (Rash).     No current facility-administered medications for this encounter.    Physical Findings: The patient is in no acute distress. Patient is alert and oriented.  vitals were not taken for this visit. .  No significant changes. Lungs are clear to auscultation bilaterally. Heart has regular rate and rhythm. No palpable cervical, supraclavicular, or axillary adenopathy. Abdomen soft, non-tender, normal bowel sounds.   Lab Findings: Lab Results  Component Value Date   WBC 5.7 09/13/2021   HGB 10.1 (L) 09/13/2021   HCT 31.7 (L) 09/13/2021   MCV 88.8 09/13/2021   PLT 161 09/13/2021    Radiographic Findings: CT CHEST WO CONTRAST  Result Date: 01/12/2022 CLINICAL DATA:  Non-small-cell lung cancer. Restaging. * Tracking Code: BO * EXAM: CT CHEST WITHOUT CONTRAST TECHNIQUE: Multidetector CT imaging of the chest was performed following the standard protocol without IV contrast. RADIATION DOSE REDUCTION: This exam was performed according to the departmental dose-optimization program which includes automated exposure control, adjustment of the mA and/or kV according to patient size and/or use of iterative reconstruction technique. COMPARISON:  09/12/2021.  07/10/2021. FINDINGS: Cardiovascular: The heart size is normal. No substantial pericardial effusion. Coronary artery calcification is evident. Status post CABG. Moderate atherosclerotic calcification is noted in the wall of the thoracic aorta. Enlargement of the pulmonary outflow tract/main pulmonary arteries suggests pulmonary arterial hypertension. Mediastinum/Nodes: No mediastinal lymphadenopathy. No evidence for gross hilar lymphadenopathy although assessment is limited by the lack of intravenous contrast on the current study. The esophagus has normal imaging features. There is no axillary lymphadenopathy. Lungs/Pleura: Marked changes of emphysema with bullous disease in the upper lungs bilaterally. 9 x 10  mm pulmonary nodule seen on the previous exam from 07/10/2021 is now 6 x 5 mm on 67/5. Treatment related interstitial disease again noted in the region of this nodule. Left apical nodule identified previously at 6 x 6 mm is 6 x 5 mm today on 39/5. 3 mm left upper lobe nodule on 109/5 is minimally more conspicuous than on the prior study. No new overtly suspicious pulmonary nodule or mass. Small right pleural effusion evident. Upper Abdomen: Abdominal intra-aortic stent graft is incompletely visualized. No evidence for adrenal nodule or mass. Musculoskeletal: No worrisome lytic or sclerotic osseous abnormality. IMPRESSION: 1. Interval decrease in size of the right upper lobe pulmonary nodule, now measuring 6 x 5 mm. 2. 6 mm left apical nodule is stable and 3 mm left upper lobe nodule is minimally more conspicuous than on the prior study but likely benign. 3. Small right pleural effusion. 4. Enlargement of the pulmonary outflow tract/main pulmonary arteries suggests pulmonary arterial hypertension. 5. Aortic Atherosclerosis (ICD10-I70.0) and Emphysema (ICD10-J43.9). Electronically Signed   By: Misty Stanley M.D.   On: 01/12/2022 06:03    Impression:  Pet positive Right upper  lobe lung nodule, suspicious for primary bronchogenic carcinoma   The patient is recovering from the effects of radiation.  ***  Plan:  ***   *** minutes of total time was spent for this patient encounter, including preparation, face-to-face counseling with the patient and coordination of care, physical exam, and documentation of the encounter. ____________________________________  Blair Promise, PhD, MD  This document serves as a record of services personally performed by Gery Pray, MD. It was created on his behalf by Roney Mans, a trained medical scribe. The creation of this record is based on the scribe's personal observations and the provider's statements to them. This document has been checked and approved by the  attending provider.

## 2022-01-18 ENCOUNTER — Encounter: Payer: Self-pay | Admitting: Radiation Oncology

## 2022-01-18 ENCOUNTER — Ambulatory Visit
Admission: RE | Admit: 2022-01-18 | Discharge: 2022-01-18 | Disposition: A | Discharge status: Home / Self Care | Payer: Medicare Other | Source: Ambulatory Visit | Attending: Radiation Oncology | Admitting: Radiation Oncology

## 2022-01-18 VITALS — BP 151/60 | HR 66 | Temp 97.2°F | Resp 18 | Ht 71.0 in | Wt 155.1 lb

## 2022-01-18 DIAGNOSIS — Z79899 Other long term (current) drug therapy: Secondary | ICD-10-CM | POA: Diagnosis not present

## 2022-01-18 DIAGNOSIS — Z7982 Long term (current) use of aspirin: Secondary | ICD-10-CM | POA: Diagnosis not present

## 2022-01-18 DIAGNOSIS — Z923 Personal history of irradiation: Secondary | ICD-10-CM | POA: Diagnosis not present

## 2022-01-18 DIAGNOSIS — R918 Other nonspecific abnormal finding of lung field: Secondary | ICD-10-CM | POA: Diagnosis not present

## 2022-01-18 DIAGNOSIS — R911 Solitary pulmonary nodule: Secondary | ICD-10-CM | POA: Insufficient documentation

## 2022-01-18 DIAGNOSIS — J90 Pleural effusion, not elsewhere classified: Secondary | ICD-10-CM | POA: Diagnosis not present

## 2022-01-18 NOTE — Progress Notes (Signed)
Brian [name] is here today for follow up post radiation to the lung.  Lung Side: right lung  Does the patient complain of any of the following: Pain:no Shortness of breath w/wo exertion: no Cough: no Hemoptysis: no Pain with swallowing: no Swallowing/choking concerns: no Appetite: good Energy Level: good Post radiation skin Changes: no    Additional comments if applicable: Patient is here with his daughter.  He is using 3 L of oxygen during the day and 2.5 L at night.  BP (!) 151/60 (BP Location: Left Arm, Patient Position: Sitting)   Pulse 66   Temp (!) 97.2 F (36.2 C) (Temporal)   Resp 18   Ht 5\' 11"  (1.803 m)   Wt 155 lb 2 oz (70.4 kg)   SpO2 94%   BMI 21.64 kg/m

## 2022-01-24 ENCOUNTER — Encounter: Payer: Self-pay | Admitting: Physician Assistant

## 2022-01-25 ENCOUNTER — Other Ambulatory Visit: Payer: Self-pay | Admitting: Physician Assistant

## 2022-01-25 MED ORDER — MEMANTINE HCL 10 MG PO TABS: ORAL_TABLET | ORAL | 11 refills | Status: DC

## 2022-01-27 DIAGNOSIS — J439 Emphysema, unspecified: Secondary | ICD-10-CM | POA: Diagnosis not present

## 2022-02-27 DIAGNOSIS — J439 Emphysema, unspecified: Secondary | ICD-10-CM | POA: Diagnosis not present

## 2022-03-05 ENCOUNTER — Telehealth: Payer: Self-pay

## 2022-03-05 NOTE — Patient Outreach (Signed)
  Care Coordination   03/05/2022 Name: Brian [name] MRN: [id number] DOB: 1931/07/24   Care Coordination Outreach Attempts:  An unsuccessful telephone outreach was attempted today to offer the patient information about available care coordination services as a benefit of their health plan.   Follow Up Plan:  Additional outreach attempts will be made to offer the patient care coordination information and services.   Encounter Outcome:  No Answer   Care Coordination Interventions:  No, not indicated    Kathyrn Sheriff, RN, MSN, BSN, CCM [facility] Care Coordinator [phone]

## 2022-03-06 ENCOUNTER — Telehealth: Payer: Self-pay

## 2022-03-06 NOTE — Patient Outreach (Signed)
  Care Coordination   03/06/2022 Name: Brian [name] MRN: [id number] DOB: Apr 28, 1931   Care Coordination Outreach Attempts:  A second unsuccessful outreach was attempted today to offer the patient with information about available care coordination services as a benefit of their health plan.     Follow Up Plan:  Additional outreach attempts will be made to offer the patient care coordination information and services.   Encounter Outcome:  No Answer   Care Coordination Interventions:  No, not indicated    Kathyrn Sheriff, RN, MSN, BSN, CCM [facility] Care Coordinator [phone]

## 2022-03-29 NOTE — Progress Notes (Signed)
Subjective:    Patient ID: Brian [name], male    DOB: 06-Feb-1932, 89 y.o.   MRN: [id number]     HPI Brian [name] is here for follow up of his chronic medical problems, including CAD, htn, COPD/asthma, DM,  CKD, hld, alzheimers, anxiety, B12 def.  He is here today with his daughter.  Overall doing well-better than when he was here last time.  Daughter - lasix prn with sob or leg swelling   Doing virtual therapy by video via the [place].     Medications and allergies reviewed with patient and updated if appropriate.  Current Outpatient Medications on File Prior to Visit  Medication Sig Dispense Refill   ACCU-CHEK COMPACT STRIPS test strip TEST BLOOD SUGAR ONCE DAILY 100 each 1   acetaminophen (TYLENOL) 500 MG tablet Take 500 mg by mouth every 6 (six) hours as needed for mild pain.     albuterol (VENTOLIN HFA) 108 (90 Base) MCG/ACT inhaler Inhale 2 puffs into the lungs every 6 (six) hours as needed for wheezing or shortness of breath. 18 g 0   ANORO ELLIPTA 62.5-25 MCG/ACT AEPB USE 1 INHALATION DAILY (Patient taking differently: Inhale 1 puff into the lungs daily.) 240 each 3   Aspirin 81 MG EC tablet Take 81 mg by mouth daily.     cholecalciferol (VITAMIN D) 1000 UNITS tablet Take 3,000 Units by mouth daily.      enalapril (VASOTEC) 10 MG tablet TAKE 1 TABLET TWICE A DAY (Patient taking differently: Take 10 mg by mouth 2 (two) times daily.) 180 tablet 3   escitalopram (LEXAPRO) 5 MG tablet Take 1 tablet (5 mg total) by mouth every morning. 30 tablet 5   furosemide (LASIX) 40 MG tablet Take 1 tablet (40 mg total) by mouth daily. 90 tablet 3   Magnesium Oxide 420 MG TABS Take 420 mg by mouth daily.      memantine (NAMENDA) 10 MG tablet Take 1 tablet (10 mg) twice a day 60 tablet 11   mirtazapine (REMERON) 15 MG tablet Take 1 tablet (15 mg total) by mouth at bedtime. 90 tablet 1   Multiple Vitamins-Minerals (MULTIVITAL PO) Take 1 tablet by mouth daily.      Polyethyl Glycol-Propyl  Glycol (SYSTANE) 0.4-0.3 % SOLN Place 1 drop into both eyes daily.     simvastatin (ZOCOR) 40 MG tablet TAKE 1 TABLET DAILY (Patient taking differently: Take 40 mg by mouth daily.) 90 tablet 3   triamcinolone cream (KENALOG) 0.1 % Apply 1 application topically daily as needed (Rash).     No current facility-administered medications on file prior to visit.     Review of Systems  Constitutional:  Negative for appetite change (good) and fever.  Respiratory:  Positive for shortness of breath (intermittent). Negative for cough and wheezing.   Cardiovascular:  Positive for palpitations (occ with anxiety) and leg swelling (intermittent). Negative for chest pain.  Neurological:  Negative for light-headedness and headaches.       Objective:   Vitals:   03/30/22 1033  BP: 136/62  Pulse: 64  Temp: 97.9 F (36.6 C)  SpO2: 95%   BP Readings from Last 3 Encounters:  03/30/22 136/62  01/18/22 (!) 151/60  12/27/21 (!) 162/60   Wt Readings from Last 3 Encounters:  03/30/22 146 lb (66.2 kg)  01/18/22 155 lb 2 oz (70.4 kg)  12/27/21 152 lb 12.8 oz (69.3 kg)   Body mass index is 20.36 kg/m.    Physical Exam Constitutional:  General: He is not in acute distress.    Appearance: Normal appearance. He is not ill-appearing.  HENT:     Head: Normocephalic and atraumatic.  Eyes:     Conjunctiva/sclera: Conjunctivae normal.  Cardiovascular:     Rate and Rhythm: Normal rate and regular rhythm.     Heart sounds: Normal heart sounds. No murmur heard. Pulmonary:     Effort: Pulmonary effort is normal. No respiratory distress.     Breath sounds: Normal breath sounds. No wheezing or rales.  Musculoskeletal:     Right lower leg: No edema.     Left lower leg: No edema.  Skin:    General: Skin is warm and dry.     Findings: No rash.  Neurological:     Mental Status: He is alert. Mental status is at baseline.  Psychiatric:        Mood and Affect: Mood normal.        Lab Results   Component Value Date   WBC 5.7 09/13/2021   HGB 10.1 (L) 09/13/2021   HCT 31.7 (L) 09/13/2021   PLT 161 09/13/2021   GLUCOSE 143 (H) 10/10/2021   CHOL 141 04/18/2021   TRIG 80.0 04/18/2021   HDL 53.90 04/18/2021   LDLCALC 71 04/18/2021   ALT 15 09/13/2021   AST 15 09/13/2021   NA 143 10/10/2021   K 4.6 10/10/2021   CL 103 10/10/2021   CREATININE 1.79 (H) 10/10/2021   BUN 28 (H) 10/10/2021   CO2 25 10/10/2021   TSH 1.77 10/04/2021   PSA 1.42 07/16/2007   INR 1.2 01/21/2019   HGBA1C 6.7 (H) 09/27/2021   MICROALBUR 9.45 03/19/2018     Assessment & Plan:    See Problem List for Assessment and Plan of chronic medical problems.

## 2022-03-29 NOTE — Patient Instructions (Addendum)
      Blood work was ordered.   The lab is on the first floor.    Medications changes include :   none      Return in about 6 months (around 09/28/2022) for follow up.

## 2022-03-30 ENCOUNTER — Other Ambulatory Visit (HOSPITAL_COMMUNITY): Payer: Self-pay

## 2022-03-30 ENCOUNTER — Ambulatory Visit (INDEPENDENT_AMBULATORY_CARE_PROVIDER_SITE_OTHER): Payer: Medicare Other | Admitting: Internal Medicine

## 2022-03-30 ENCOUNTER — Encounter: Payer: Self-pay | Admitting: Internal Medicine

## 2022-03-30 ENCOUNTER — Other Ambulatory Visit: Payer: Self-pay | Admitting: Internal Medicine

## 2022-03-30 VITALS — BP 136/62 | HR 64 | Temp 97.9°F | Ht 71.0 in | Wt 146.0 lb

## 2022-03-30 DIAGNOSIS — E782 Mixed hyperlipidemia: Secondary | ICD-10-CM

## 2022-03-30 DIAGNOSIS — E1122 Type 2 diabetes mellitus with diabetic chronic kidney disease: Secondary | ICD-10-CM | POA: Diagnosis not present

## 2022-03-30 DIAGNOSIS — I10 Essential (primary) hypertension: Secondary | ICD-10-CM | POA: Diagnosis not present

## 2022-03-30 DIAGNOSIS — G309 Alzheimer's disease, unspecified: Secondary | ICD-10-CM

## 2022-03-30 DIAGNOSIS — F419 Anxiety disorder, unspecified: Secondary | ICD-10-CM

## 2022-03-30 DIAGNOSIS — J439 Emphysema, unspecified: Secondary | ICD-10-CM | POA: Diagnosis not present

## 2022-03-30 DIAGNOSIS — F0280 Dementia in other diseases classified elsewhere without behavioral disturbance: Secondary | ICD-10-CM | POA: Diagnosis not present

## 2022-03-30 DIAGNOSIS — E538 Deficiency of other specified B group vitamins: Secondary | ICD-10-CM

## 2022-03-30 DIAGNOSIS — F0150 Vascular dementia without behavioral disturbance: Secondary | ICD-10-CM

## 2022-03-30 DIAGNOSIS — N182 Chronic kidney disease, stage 2 (mild): Secondary | ICD-10-CM

## 2022-03-30 DIAGNOSIS — I2510 Atherosclerotic heart disease of native coronary artery without angina pectoris: Secondary | ICD-10-CM | POA: Diagnosis not present

## 2022-03-30 LAB — CBC WITH DIFFERENTIAL/PLATELET
Basophils Absolute: 0 10*3/uL (ref 0.0–0.1)
Basophils Relative: 0.7 % (ref 0.0–3.0)
Eosinophils Absolute: 0.1 10*3/uL (ref 0.0–0.7)
Eosinophils Relative: 1.5 % (ref 0.0–5.0)
HCT: 26.8 % — ABNORMAL LOW (ref 39.0–52.0)
Hemoglobin: 8.6 g/dL — ABNORMAL LOW (ref 13.0–17.0)
Lymphocytes Relative: 25.3 % (ref 12.0–46.0)
Lymphs Abs: 1.1 10*3/uL (ref 0.7–4.0)
MCHC: 32 g/dL (ref 30.0–36.0)
MCV: 81.7 fl (ref 78.0–100.0)
Monocytes Absolute: 0.4 10*3/uL (ref 0.1–1.0)
Monocytes Relative: 9 % (ref 3.0–12.0)
Neutro Abs: 2.8 10*3/uL (ref 1.4–7.7)
Neutrophils Relative %: 63.5 % (ref 43.0–77.0)
Platelets: 154 10*3/uL (ref 150.0–400.0)
RBC: 3.28 Mil/uL — ABNORMAL LOW (ref 4.22–5.81)
RDW: 20.3 % — ABNORMAL HIGH (ref 11.5–15.5)
WBC: 4.5 10*3/uL (ref 4.0–10.5)

## 2022-03-30 LAB — COMPREHENSIVE METABOLIC PANEL
ALT: 9 U/L (ref 0–53)
AST: 12 U/L (ref 0–37)
Albumin: 4.2 g/dL (ref 3.5–5.2)
Alkaline Phosphatase: 58 U/L (ref 39–117)
BUN: 41 mg/dL — ABNORMAL HIGH (ref 6–23)
CO2: 26 mEq/L (ref 19–32)
Calcium: 9.4 mg/dL (ref 8.4–10.5)
Chloride: 109 mEq/L (ref 96–112)
Creatinine, Ser: 1.77 mg/dL — ABNORMAL HIGH (ref 0.40–1.50)
GFR: 33.95 mL/min — ABNORMAL LOW (ref >60.00)
Glucose, Bld: 92 mg/dL (ref 70–99)
Potassium: 5.9 mEq/L — ABNORMAL HIGH (ref 3.5–5.1)
Sodium: 142 mEq/L (ref 135–145)
Total Bilirubin: 0.4 mg/dL (ref 0.2–1.2)
Total Protein: 7.2 g/dL (ref 6.0–8.3)

## 2022-03-30 LAB — LIPID PANEL
Cholesterol: 123 mg/dL (ref 0–200)
HDL: 47.7 mg/dL (ref >39.00)
LDL Cholesterol: 56 mg/dL (ref 0–99)
NonHDL: 75.11
Total CHOL/HDL Ratio: 3
Triglycerides: 98 mg/dL (ref 0.0–149.0)
VLDL: 19.6 mg/dL (ref 0.0–40.0)

## 2022-03-30 LAB — HEMOGLOBIN A1C: Hgb A1c MFr Bld: 6.5 % (ref 4.6–6.5)

## 2022-03-30 LAB — VITAMIN B12: Vitamin B-12: 547 pg/mL (ref 211–911)

## 2022-03-30 MED ORDER — VITAMIN D 1000 UNITS PO TABS
3000.0000 [IU] | ORAL_TABLET | Freq: Every day | ORAL | 11 refills | Status: DC
  Filled 2022-03-30: qty 90, 30d supply, fill #0

## 2022-03-30 NOTE — Assessment & Plan Note (Addendum)
Saw neurology and was diagnosed with mixed Alzheimer's and vascular dementia On Namenda 10 mg twice daily

## 2022-03-30 NOTE — Assessment & Plan Note (Signed)
Chronic   Lab Results  Component Value Date   HGBA1C 6.7 (H) 09/27/2021   Sugars controlled Testing sugars 1 times a day Check A1c Continue lifestyle control

## 2022-03-30 NOTE — Assessment & Plan Note (Signed)
Chronic Following with cardiology Denies any chest pain Continue aspirin 81 mg daily, Zocor 40 mg daily

## 2022-03-30 NOTE — Assessment & Plan Note (Signed)
Chronic Blood pressure well controlled CMP Continue enalapril 10 mg twice daily

## 2022-03-30 NOTE — Assessment & Plan Note (Addendum)
Chronic Check B12 level Taking B12 daily

## 2022-03-30 NOTE — Assessment & Plan Note (Signed)
Chronic Regular exercise and healthy diet encouraged Check lipid panel  Continue simvastatin 40 mg daily

## 2022-03-30 NOTE — Assessment & Plan Note (Addendum)
Chronic Generalized block with panic attacks-panic attacks are controlled On Lexapro 5 mg daily, Remeron 15 mg at bedtime

## 2022-03-31 ENCOUNTER — Encounter: Payer: Self-pay | Admitting: Internal Medicine

## 2022-04-02 ENCOUNTER — Encounter (HOSPITAL_COMMUNITY): Payer: Self-pay | Admitting: Emergency Medicine

## 2022-04-02 ENCOUNTER — Other Ambulatory Visit: Payer: Self-pay

## 2022-04-02 ENCOUNTER — Other Ambulatory Visit: Payer: Self-pay | Admitting: Internal Medicine

## 2022-04-02 ENCOUNTER — Inpatient Hospital Stay (HOSPITAL_COMMUNITY)
Admission: EM | Admit: 2022-04-02 | Discharge: 2022-04-05 | DRG: 378 | Disposition: A | Discharge status: Home / Self Care | Payer: Medicare Other | Attending: Internal Medicine | Admitting: Internal Medicine

## 2022-04-02 ENCOUNTER — Telehealth: Payer: Self-pay | Admitting: Internal Medicine

## 2022-04-02 DIAGNOSIS — D62 Acute posthemorrhagic anemia: Secondary | ICD-10-CM | POA: Diagnosis present

## 2022-04-02 DIAGNOSIS — E1169 Type 2 diabetes mellitus with other specified complication: Secondary | ICD-10-CM | POA: Diagnosis present

## 2022-04-02 DIAGNOSIS — K31811 Angiodysplasia of stomach and duodenum with bleeding: Secondary | ICD-10-CM | POA: Diagnosis not present

## 2022-04-02 DIAGNOSIS — Z923 Personal history of irradiation: Secondary | ICD-10-CM | POA: Diagnosis not present

## 2022-04-02 DIAGNOSIS — K5521 Angiodysplasia of colon with hemorrhage: Principal | ICD-10-CM | POA: Diagnosis present

## 2022-04-02 DIAGNOSIS — E1122 Type 2 diabetes mellitus with diabetic chronic kidney disease: Secondary | ICD-10-CM | POA: Diagnosis present

## 2022-04-02 DIAGNOSIS — Z87891 Personal history of nicotine dependence: Secondary | ICD-10-CM | POA: Diagnosis not present

## 2022-04-02 DIAGNOSIS — Z83438 Family history of other disorder of lipoprotein metabolism and other lipidemia: Secondary | ICD-10-CM

## 2022-04-02 DIAGNOSIS — R001 Bradycardia, unspecified: Secondary | ICD-10-CM | POA: Diagnosis not present

## 2022-04-02 DIAGNOSIS — Z833 Family history of diabetes mellitus: Secondary | ICD-10-CM

## 2022-04-02 DIAGNOSIS — Z955 Presence of coronary angioplasty implant and graft: Secondary | ICD-10-CM

## 2022-04-02 DIAGNOSIS — J9611 Chronic respiratory failure with hypoxia: Secondary | ICD-10-CM | POA: Diagnosis not present

## 2022-04-02 DIAGNOSIS — E875 Hyperkalemia: Secondary | ICD-10-CM | POA: Diagnosis present

## 2022-04-02 DIAGNOSIS — Z888 Allergy status to other drugs, medicaments and biological substances status: Secondary | ICD-10-CM

## 2022-04-02 DIAGNOSIS — Z86010 Personal history of colonic polyps: Secondary | ICD-10-CM

## 2022-04-02 DIAGNOSIS — Z8679 Personal history of other diseases of the circulatory system: Secondary | ICD-10-CM

## 2022-04-02 DIAGNOSIS — E1151 Type 2 diabetes mellitus with diabetic peripheral angiopathy without gangrene: Secondary | ICD-10-CM | POA: Diagnosis not present

## 2022-04-02 DIAGNOSIS — K5730 Diverticulosis of large intestine without perforation or abscess without bleeding: Secondary | ICD-10-CM | POA: Diagnosis present

## 2022-04-02 DIAGNOSIS — I2510 Atherosclerotic heart disease of native coronary artery without angina pectoris: Secondary | ICD-10-CM | POA: Diagnosis present

## 2022-04-02 DIAGNOSIS — E785 Hyperlipidemia, unspecified: Secondary | ICD-10-CM | POA: Diagnosis present

## 2022-04-02 DIAGNOSIS — Z8249 Family history of ischemic heart disease and other diseases of the circulatory system: Secondary | ICD-10-CM

## 2022-04-02 DIAGNOSIS — Z79899 Other long term (current) drug therapy: Secondary | ICD-10-CM

## 2022-04-02 DIAGNOSIS — D649 Anemia, unspecified: Secondary | ICD-10-CM

## 2022-04-02 DIAGNOSIS — F410 Panic disorder [episodic paroxysmal anxiety] without agoraphobia: Secondary | ICD-10-CM | POA: Diagnosis present

## 2022-04-02 DIAGNOSIS — I110 Hypertensive heart disease with heart failure: Secondary | ICD-10-CM | POA: Diagnosis not present

## 2022-04-02 DIAGNOSIS — G309 Alzheimer's disease, unspecified: Secondary | ICD-10-CM | POA: Diagnosis present

## 2022-04-02 DIAGNOSIS — Z951 Presence of aortocoronary bypass graft: Secondary | ICD-10-CM

## 2022-04-02 DIAGNOSIS — F0154 Vascular dementia, unspecified severity, with anxiety: Secondary | ICD-10-CM | POA: Diagnosis not present

## 2022-04-02 DIAGNOSIS — E119 Type 2 diabetes mellitus without complications: Secondary | ICD-10-CM

## 2022-04-02 DIAGNOSIS — Z885 Allergy status to narcotic agent status: Secondary | ICD-10-CM

## 2022-04-02 DIAGNOSIS — N1832 Chronic kidney disease, stage 3b: Secondary | ICD-10-CM | POA: Diagnosis not present

## 2022-04-02 DIAGNOSIS — I129 Hypertensive chronic kidney disease with stage 1 through stage 4 chronic kidney disease, or unspecified chronic kidney disease: Secondary | ICD-10-CM | POA: Diagnosis present

## 2022-04-02 DIAGNOSIS — F0284 Dementia in other diseases classified elsewhere, unspecified severity, with anxiety: Secondary | ICD-10-CM | POA: Diagnosis present

## 2022-04-02 DIAGNOSIS — I509 Heart failure, unspecified: Secondary | ICD-10-CM | POA: Diagnosis not present

## 2022-04-02 DIAGNOSIS — I252 Old myocardial infarction: Secondary | ICD-10-CM | POA: Diagnosis not present

## 2022-04-02 DIAGNOSIS — R944 Abnormal results of kidney function studies: Secondary | ICD-10-CM | POA: Diagnosis present

## 2022-04-02 DIAGNOSIS — I714 Abdominal aortic aneurysm, without rupture, unspecified: Secondary | ICD-10-CM | POA: Diagnosis present

## 2022-04-02 DIAGNOSIS — Z85118 Personal history of other malignant neoplasm of bronchus and lung: Secondary | ICD-10-CM

## 2022-04-02 DIAGNOSIS — D500 Iron deficiency anemia secondary to blood loss (chronic): Secondary | ICD-10-CM

## 2022-04-02 DIAGNOSIS — Z9981 Dependence on supplemental oxygen: Secondary | ICD-10-CM | POA: Diagnosis not present

## 2022-04-02 DIAGNOSIS — K5520 Angiodysplasia of colon without hemorrhage: Secondary | ICD-10-CM | POA: Diagnosis present

## 2022-04-02 DIAGNOSIS — J4489 Other specified chronic obstructive pulmonary disease: Secondary | ICD-10-CM | POA: Diagnosis not present

## 2022-04-02 DIAGNOSIS — K922 Gastrointestinal hemorrhage, unspecified: Secondary | ICD-10-CM | POA: Diagnosis present

## 2022-04-02 DIAGNOSIS — E1159 Type 2 diabetes mellitus with other circulatory complications: Secondary | ICD-10-CM | POA: Diagnosis present

## 2022-04-02 DIAGNOSIS — F0150 Vascular dementia without behavioral disturbance: Secondary | ICD-10-CM | POA: Diagnosis present

## 2022-04-02 DIAGNOSIS — G5602 Carpal tunnel syndrome, left upper limb: Secondary | ICD-10-CM | POA: Diagnosis present

## 2022-04-02 DIAGNOSIS — N184 Chronic kidney disease, stage 4 (severe): Secondary | ICD-10-CM | POA: Diagnosis present

## 2022-04-02 DIAGNOSIS — Z7982 Long term (current) use of aspirin: Secondary | ICD-10-CM

## 2022-04-02 DIAGNOSIS — J449 Chronic obstructive pulmonary disease, unspecified: Secondary | ICD-10-CM | POA: Diagnosis present

## 2022-04-02 DIAGNOSIS — M1990 Unspecified osteoarthritis, unspecified site: Secondary | ICD-10-CM | POA: Diagnosis present

## 2022-04-02 DIAGNOSIS — I10 Essential (primary) hypertension: Secondary | ICD-10-CM | POA: Diagnosis present

## 2022-04-02 DIAGNOSIS — K921 Melena: Secondary | ICD-10-CM | POA: Diagnosis present

## 2022-04-02 LAB — CBC WITH DIFFERENTIAL/PLATELET
Abs Immature Granulocytes: 0.01 10*3/uL (ref 0.00–0.07)
Basophils Absolute: 0 10*3/uL (ref 0.0–0.1)
Basophils Relative: 1 %
Eosinophils Absolute: 0.1 10*3/uL (ref 0.0–0.5)
Eosinophils Relative: 2 %
HCT: 28.6 % — ABNORMAL LOW (ref 39.0–52.0)
Hemoglobin: 8.3 g/dL — ABNORMAL LOW (ref 13.0–17.0)
Immature Granulocytes: 0 %
Lymphocytes Relative: 26 %
Lymphs Abs: 1.1 10*3/uL (ref 0.7–4.0)
MCH: 25.5 pg — ABNORMAL LOW (ref 26.0–34.0)
MCHC: 29 g/dL — ABNORMAL LOW (ref 30.0–36.0)
MCV: 88 fL (ref 80.0–100.0)
Monocytes Absolute: 0.4 10*3/uL (ref 0.1–1.0)
Monocytes Relative: 9 %
Neutro Abs: 2.8 10*3/uL (ref 1.7–7.7)
Neutrophils Relative %: 62 %
Platelets: 160 10*3/uL (ref 150–400)
RBC: 3.25 MIL/uL — ABNORMAL LOW (ref 4.22–5.81)
RDW: 19.9 % — ABNORMAL HIGH (ref 11.5–15.5)
WBC: 4.4 10*3/uL (ref 4.0–10.5)
nRBC: 0 % (ref 0.0–0.2)

## 2022-04-02 LAB — COMPREHENSIVE METABOLIC PANEL
ALT: 11 U/L (ref 0–44)
AST: 15 U/L (ref 15–41)
Albumin: 4.3 g/dL (ref 3.5–5.0)
Alkaline Phosphatase: 50 U/L (ref 38–126)
Anion gap: 10 (ref 5–15)
BUN: 44 mg/dL — ABNORMAL HIGH (ref 8–23)
CO2: 24 mmol/L (ref 22–32)
Calcium: 8.8 mg/dL — ABNORMAL LOW (ref 8.9–10.3)
Chloride: 106 mmol/L (ref 98–111)
Creatinine, Ser: 1.89 mg/dL — ABNORMAL HIGH (ref 0.61–1.24)
GFR, Estimated: 34 mL/min — ABNORMAL LOW (ref >60)
Glucose, Bld: 141 mg/dL — ABNORMAL HIGH (ref 70–99)
Potassium: 5.4 mmol/L — ABNORMAL HIGH (ref 3.5–5.1)
Sodium: 140 mmol/L (ref 135–145)
Total Bilirubin: 0.5 mg/dL (ref 0.3–1.2)
Total Protein: 7.4 g/dL (ref 6.5–8.1)

## 2022-04-02 LAB — URINALYSIS, ROUTINE W REFLEX MICROSCOPIC
Bacteria, UA: NONE SEEN
Bilirubin Urine: NEGATIVE
Glucose, UA: NEGATIVE mg/dL
Hgb urine dipstick: NEGATIVE
Ketones, ur: NEGATIVE mg/dL
Leukocytes,Ua: NEGATIVE
Nitrite: NEGATIVE
Protein, ur: 30 mg/dL — AB
Specific Gravity, Urine: 1.015 (ref 1.005–1.030)
pH: 7 (ref 5.0–8.0)

## 2022-04-02 LAB — LIPASE, BLOOD: Lipase: 51 U/L (ref 11–51)

## 2022-04-02 LAB — POC OCCULT BLOOD, ED: Fecal Occult Bld: POSITIVE — AB

## 2022-04-02 MED ORDER — UMECLIDINIUM-VILANTEROL 62.5-25 MCG/ACT IN AEPB
1.0000 | INHALATION_SPRAY | Freq: Every day | RESPIRATORY_TRACT | Status: DC
  Administered 2022-04-03 – 2022-04-05 (×3): 1 via RESPIRATORY_TRACT
  Filled 2022-04-02: qty 14

## 2022-04-02 MED ORDER — ESCITALOPRAM OXALATE 10 MG PO TABS
5.0000 mg | ORAL_TABLET | Freq: Every morning | ORAL | Status: DC
  Administered 2022-04-03 – 2022-04-05 (×2): 5 mg via ORAL
  Filled 2022-04-02 (×2): qty 1

## 2022-04-02 MED ORDER — MORPHINE SULFATE (PF) 2 MG/ML IV SOLN: 2.0000 mg | INTRAVENOUS | Status: DC | PRN

## 2022-04-02 MED ORDER — PANTOPRAZOLE SODIUM 40 MG IV SOLR
40.0000 mg | Freq: Once | INTRAVENOUS | Status: AC
  Administered 2022-04-02: 40 mg via INTRAVENOUS
  Filled 2022-04-02: qty 10

## 2022-04-02 MED ORDER — SODIUM CHLORIDE 0.9% FLUSH
3.0000 mL | Freq: Two times a day (BID) | INTRAVENOUS | Status: DC
  Administered 2022-04-02 – 2022-04-05 (×5): 3 mL via INTRAVENOUS

## 2022-04-02 MED ORDER — ALBUTEROL SULFATE HFA 108 (90 BASE) MCG/ACT IN AERS: 2.0000 | INHALATION_SPRAY | Freq: Four times a day (QID) | RESPIRATORY_TRACT | Status: DC | PRN

## 2022-04-02 MED ORDER — POLYVINYL ALCOHOL 1.4 % OP SOLN
1.0000 [drp] | Freq: Four times a day (QID) | OPHTHALMIC | Status: DC
  Administered 2022-04-03 – 2022-04-05 (×8): 1 [drp] via OPHTHALMIC
  Filled 2022-04-02: qty 15

## 2022-04-02 MED ORDER — PROPYLENE GLYCOL (PF) 0.6 % OP SOLN: 1.0000 [drp] | Freq: Four times a day (QID) | OPHTHALMIC | Status: DC

## 2022-04-02 MED ORDER — LACTATED RINGERS IV SOLN: INTRAVENOUS | Status: DC

## 2022-04-02 MED ORDER — MIRTAZAPINE 15 MG PO TABS
15.0000 mg | ORAL_TABLET | Freq: Every day | ORAL | Status: DC
  Administered 2022-04-02 – 2022-04-04 (×3): 15 mg via ORAL
  Filled 2022-04-02 (×3): qty 1

## 2022-04-02 MED ORDER — ALBUTEROL SULFATE (2.5 MG/3ML) 0.083% IN NEBU: 2.5000 mg | INHALATION_SOLUTION | Freq: Four times a day (QID) | RESPIRATORY_TRACT | Status: DC | PRN

## 2022-04-02 MED ORDER — ONDANSETRON HCL 4 MG PO TABS: 4.0000 mg | ORAL_TABLET | Freq: Four times a day (QID) | ORAL | Status: DC | PRN

## 2022-04-02 MED ORDER — SIMVASTATIN 40 MG PO TABS
40.0000 mg | ORAL_TABLET | Freq: Every day | ORAL | Status: DC
  Administered 2022-04-03 – 2022-04-05 (×2): 40 mg via ORAL
  Filled 2022-04-02 (×2): qty 1

## 2022-04-02 MED ORDER — ACETAMINOPHEN 650 MG RE SUPP: 650.0000 mg | Freq: Four times a day (QID) | RECTAL | Status: DC | PRN

## 2022-04-02 MED ORDER — MEMANTINE HCL 10 MG PO TABS
5.0000 mg | ORAL_TABLET | Freq: Every day | ORAL | Status: DC
  Administered 2022-04-03 – 2022-04-05 (×2): 5 mg via ORAL
  Filled 2022-04-02 (×2): qty 1

## 2022-04-02 MED ORDER — BUSPIRONE HCL 5 MG PO TABS
5.0000 mg | ORAL_TABLET | Freq: Two times a day (BID) | ORAL | Status: DC | PRN
  Administered 2022-04-03: 5 mg via ORAL
  Filled 2022-04-02: qty 1

## 2022-04-02 MED ORDER — ACETAMINOPHEN 325 MG PO TABS: 650.0000 mg | ORAL_TABLET | Freq: Four times a day (QID) | ORAL | Status: DC | PRN

## 2022-04-02 MED ORDER — SODIUM CHLORIDE 0.9 % IV BOLUS
1000.0000 mL | Freq: Once | INTRAVENOUS | Status: AC
  Administered 2022-04-02: 1000 mL via INTRAVENOUS

## 2022-04-02 MED ORDER — ONDANSETRON HCL 4 MG/2ML IJ SOLN: 4.0000 mg | Freq: Four times a day (QID) | INTRAMUSCULAR | Status: DC | PRN

## 2022-04-02 MED ORDER — HYDRALAZINE HCL 20 MG/ML IJ SOLN
5.0000 mg | INTRAMUSCULAR | Status: DC | PRN
  Administered 2022-04-03: 5 mg via INTRAVENOUS
  Filled 2022-04-02: qty 1

## 2022-04-02 NOTE — H&P (Signed)
History and Physical    Patient: Brian [name] DOB: 1931/03/02 DOA: 04/02/2022 DOS: the patient was seen and examined on 04/02/2022 PCP: Binnie Rail, MD  Patient coming from: Home - lives with daughter; Donald Prose: Harry, [place] [phone]   Chief Complaint: Melena  HPI: Brian [name] is a 89 y.o. male with medical history significant of AAA, CAD, COPD, DM, HTN, and HLD presenting with melena.   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  He was talking to his daughter, eats chocolate pastries. He thought his stool was black related to this.  His Hgb had dropped from 10-11 to 8.6 last Thursday.  They texted the PCP and she encouraged them to come to the ER.  Stools have been dark for months.  He occasionally gets winded with ambulation - but he has panic attacks and so thought this was the reason.  They also give Lasix as needed at times with DOE.  No abdominal pain.  Last colonoscopy at age [age].  No h/o EGD.  No apparent reflux issues.  Occasional leg cramps, better with mustard,    ER Course:  Fatigue, dark stools.  Saw PCP Friday, Hgb 8.6 - baseline 10.5.  Hgb 8.3 today, heme +, elevated BUN.  K+ 5.4, given IVF.  D/w Dr. Tarri Glenn, GI to see.     Review of Systems: As mentioned in the history of present illness. All other systems reviewed and are negative. Past Medical History:  Diagnosis Date   AAA (abdominal aortic aneurysm) ([place])    Arthritis    Asthma 1976   Bronchiolitis 06/2014   CAD (coronary artery disease)    NSTEMI 02/2010;  s/p CABG 1/12 (L-LAD, SOM1, S-PDA); echo in 08/2010: Mild LVH, EF 10%, grade 2 diastolic dysfunction, mild MR, mild LAE, mildly reduced RV function, mild RAE, PASP 42   Carpal tunnel syndrome of left wrist    Colon polyps    COPD (chronic obstructive pulmonary disease) (HCC)    Diabetes mellitus 2000   Type II, controlls with diet   Dyspnea    ED (erectile dysfunction)    Gout    History of radiation therapy  03/09/2020-03/16/2020   SBRT right lung: Dr. Gery Pray   HTN (hypertension)    Hyperlipidemia    Inguinal hernia    Right   Macular degeneration    Myocardial infarction [facility]) 1 -13-2012   Neuropathy    Past Surgical History:  Procedure Laterality Date   3 vessel CABG  03/03/2010   ABDOMINAL AORTIC ENDOVASCULAR STENT GRAFT N/A 01/21/2019   Procedure: ABDOMINAL AORTIC ENDOVASCULAR STENT GRAFT;  Surgeon: Waynetta Sandy, MD;  Location: [place] [facility];  Service: Vascular;  Laterality: N/A;   cataract left eye     COLONOSCOPY     neg 2009   COLONOSCOPY  2014   5 mm sessile polyp; AVM   CORONARY ARTERY BYPASS GRAFT  2012   X3   ENDOVASCULAR REPAIR/STENT GRAFT  01/21/2019   ABDOMINAL AORTIC ENDOVASCULAR STENT GRAFT (N/A )   EYE SURGERY     Left cataract   HERNIA REPAIR     INCISION AND DRAINAGE PERIRECTAL ABSCESS     INGUINAL HERNIA REPAIR     Social History:  reports that he quit smoking about 19 years ago. His smoking use included cigarettes. He has a 40.00 pack-year smoking history. He has never used smokeless tobacco. He reports that he does not currently use alcohol after a past usage of about 6.0 standard  drinks of alcohol per week. He reports that he does not use drugs.  Allergies  Allergen Reactions   Meperidine Hcl Swelling and Other (See Comments)    Tongue swelling Because of a history of documented adverse serious drug reaction, Medi Alert bracelet  is recommended.   Sertraline Shortness Of Breath and Other (See Comments)    Burning sensation from feet to head, diff breathing   Gabapentin Other (See Comments)    Confusion    Memantine Other (See Comments)    Can take only up to 5 mg a day- causes lethargy past that amount   Zolpidem Tartrate Other (See Comments)    Nightmares    Family History  Problem Relation Age of Onset   Heart failure Mother    Heart disease Mother        CHF   Heart attack Mother    Hypertension Mother    Heart attack Father     Heart disease Father 21       MI   Hypertension Father    Heart attack Sister 66   Heart disease Sister 76       MI   Diabetes Sister    Hyperlipidemia Sister    Hypertension Sister    Varicose Veins Sister    Heart attack Brother 2   Diabetes Brother    Heart disease Brother 59       MI   Cancer Brother    Hypertension Brother    Deep vein thrombosis Daughter    Leukemia Daughter    Other Son        varicose veins   Arthritis/Rheumatoid Son    Lupus Daughter    Colon cancer Neg Hx     Prior to Admission medications   Medication Sig Start Date End Date Taking? Authorizing Provider  albuterol (VENTOLIN HFA) 108 (90 Base) MCG/ACT inhaler Inhale 2 puffs into the lungs every 6 (six) hours as needed for wheezing or shortness of breath. 01/15/22  Yes Burns, [name], MD  ANORO ELLIPTA 62.5-25 MCG/ACT AEPB USE 1 INHALATION DAILY Patient taking differently: Inhale 1 puff into the lungs daily. 04/18/21  Yes Binnie Rail, MD  Aspirin 81 MG EC tablet Take 81 mg by mouth daily.   Yes [provider]  Cholecalciferol (VITAMIN D3) 50 MCG (2000 UT) TABS Take 2,000 Units by mouth daily.   Yes [provider]  Cyanocobalamin (VITAMIN B12 PO) Take 1 tablet by mouth daily.   Yes [provider]  enalapril (VASOTEC) 10 MG tablet TAKE 1 TABLET TWICE A DAY Patient taking differently: Take 10 mg by mouth 2 (two) times daily. 08/28/21  Yes Burns, [name], MD  escitalopram (LEXAPRO) 5 MG tablet Take 1 tablet (5 mg total) by mouth every morning. 09/27/21  Yes Burns, [name], MD  furosemide (LASIX) 40 MG tablet Take 1 tablet (40 mg total) by mouth daily. Patient taking differently: Take 40 mg by mouth daily as needed for fluid or edema. 12/27/21  Yes Burnell Blanks, MD  Magnesium Oxide 420 MG TABS Take 420 mg by mouth daily.    Yes [provider]  memantine (NAMENDA) 10 MG tablet Take 1 tablet (10 mg) twice a day Patient taking differently: Take 5 mg by mouth  in the morning. 01/25/22  Yes Wertman, Coralee Pesa, PA-C  mirtazapine (REMERON) 15 MG tablet TAKE 1 TABLET(15 MG) BY MOUTH AT BEDTIME Patient taking differently: Take 15 mg by mouth at bedtime. 04/02/22  Yes Burns, [name], MD  simvastatin (ZOCOR) 40 MG tablet TAKE 1 TABLET DAILY Patient taking differently: Take 40 mg by mouth daily. 04/18/21  Yes Burns, [name], MD  SYSTANE COMPLETE PF 0.6 % SOLN Place 1 drop into both eyes 4 (four) times daily.   Yes [provider]  triamcinolone cream (KENALOG) 0.1 % Apply 1 application  topically daily as needed (for dryness/itching).   Yes [provider]  ACCU-CHEK COMPACT STRIPS test strip TEST BLOOD SUGAR ONCE DAILY 08/18/11   Hendricks Limes, MD  cholecalciferol (VITAMIN D) 1000 units tablet Take 3 tablets (3,000 Units total) by mouth daily. Patient not taking: Reported on 04/02/2022 03/30/22   Binnie Rail, MD    Physical Exam: Vitals:   04/02/22 1800 04/02/22 1815 04/02/22 2046 04/02/22 2228  BP: (!) 150/63 (!) 160/74 (!) 140/92 (!) 191/71  Pulse: 61 (!) 31 90 (!) 59  Resp: (!) 21 17 19 14   Temp:   98.2 F (36.8 C) 98.2 F (36.8 C)  TempSrc:    Oral  SpO2: 100% 99% 97% 91%  Weight:      Height:       General:  Appears calm and comfortable and is in NAD Eyes:  PERRL, EOMI, normal lids, iris ENT:  grossly normal hearing, lips & tongue, mmm; artificial dentition Neck:  no LAD, masses or thyromegaly Cardiovascular:  RRR, no m/r/g. No LE edema.  Respiratory:   CTA bilaterally with no wheezes/rales/rhonchi.  Normal respiratory effort. Abdomen:  soft, NT, ND Skin:  no rash or induration seen on limited exam Musculoskeletal:  grossly normal tone BUE/BLE, good ROM, no bony abnormality Psychiatric:  grossly normal mood and affect, speech fluent and appropriate, AOx3 Neurologic:  CN 2-12 grossly intact, moves all extremities in coordinated fashion   Radiological Exams on Admission: Independently reviewed - see discussion in A/P  where applicable  No results found.  EKG: Independently reviewed.  Sinus bradycardia with rate 47; RBBB, LAFB; nonspecific ST changes with no evidence of acute ischemia   Labs on Admission: I have personally reviewed the available labs and imaging studies at the time of the admission.  Pertinent labs:    K+ 5.4 Glucose 141 BUN 44/Creatinine 1.89/GFR 34 - stable WBC 4.4 Hgb 8.3; 10.1 on 7/26 UA: 30 protein Heme positive A1c 6.7 on 09/27/21   Assessment and Plan: Principal Problem:   Acute upper GI bleeding Active Problems:   COPD (chronic obstructive pulmonary disease) (HCC)   Stage 3b chronic kidney disease (CKD) (HCC)   Panic attacks   Hypertension   Hyperlipidemia   CAD (coronary artery disease)   AAA (abdominal aortic aneurysm) without rupture s/p AAA repair   Diabetes mellitus type 2 without retinopathy ([place])   Mixed Alzheimer's and vascular dementia ([place])    Upper GI Bleeding -Patient is presenting with melena and fatigue, suggestive of upper GI bleeding. -Remote colonoscopy - if no upper source is found he may need a repeat C-scope -Most likely diagnosis is gastric or duodenal ulcer, esophagitis or gastritis, or Mallory-Weiss tear. -The patient is not tachycardic with normal blood pressure, suggesting subacute volume loss.  -Will observe on telemetry -GI consulted by ED, will see in AM -NPO for possible EGD -LR at 100 mL/hr -Start IV pantoprazole 40 BID for patients with ongoing bleeding who need urgent EGD -Zofran IV for nausea -Avoid NSAIDs and SQ heparin -Maintain IV access (2 large bore IVs if possible). -Hold ASA for now -Type and screen were done  -  Monitor closely and follow cbc by repeating again in AM, transfuse as necessary for Hbg <8 with h/o cardiac disease.  Anxiety/dementia -Patient has h/o anxiety with panic attacks -Will continue escitalopram, Namenda, mirtazepine -Will add prn buspirone -Delirium precautions  CAD, h/o AAA -Hold ASA for  now -s/p AAA repair in 01/2019 -Due for vascular f/u -No current c/o CP  COPD -On home 3L [place] O2 -Continue Anoro, albuterol prn  DM -Recent A1c showed good control -Not on home meds at this time -Will follow with fasting glucose for now  HTN -Hold enalapril for now  HLD -Continue simvastatin  Stage 3b CKD -Appears to be stable at this time -Attempt to avoid nephrotoxic medications -Recheck BMP in AM        Advance Care Planning:   Code Status: Full Code - Code status was discussed with the patient and/or family at the time of admission.  The patient would want to receive full resuscitative measures at this time.   Consults: GI  DVT Prophylaxis: SCDs  Family Communication: Daughter and daughter-in-law were present throughout evaluation  Severity of Illness: The appropriate patient status for this patient is OBSERVATION. Observation status is judged to be reasonable and necessary in order to provide the required intensity of service to ensure the patient's safety. The patient's presenting symptoms, physical exam findings, and initial radiographic and laboratory data in the context of their medical condition is felt to place them at decreased risk for further clinical deterioration. Furthermore, it is anticipated that the patient will be medically stable for discharge from the hospital within 2 midnights of admission.   Author: Karmen Bongo, MD 04/02/2022 10:55 PM  For on call review [email or web address].

## 2022-04-02 NOTE — ED Provider Notes (Signed)
[place] EMERGENCY DEPARTMENT AT [facility] Provider Note   CSN: [id number] Arrival date & time: 04/02/22  1148     History  Chief Complaint  Patient presents with   Abnormal Labs   Melena    Brian [name] is a 89 y.o. male.  HPI 89 year old male with multiple comorbidities which include COPD on home oxygen, CAD, AAA, hypertension, MI, and other comorbidities presents with fatigue and concern for GI bleed.  Has been fatigued for the last week or 2 and then family noted that he is noting black stools at least over the last few days.  He had blood work obtained a few days ago by his PCP and results showed an acute on chronic anemia with a hemoglobin of 8.6 as well as hyperkalemia up to 5.9.  He has generalized fatigue but denies any new chest pain, shortness of breath, abdominal pain.  He is on a baby aspirin but no other blood thinners and no NSAIDs.  He has never had an ulcer to his knowledge.  Home Medications Prior to Admission medications   Medication Sig Start Date End Date Taking? Authorizing Provider  albuterol (VENTOLIN HFA) 108 (90 Base) MCG/ACT inhaler Inhale 2 puffs into the lungs every 6 (six) hours as needed for wheezing or shortness of breath. 01/15/22  Yes Burns, [name], MD  ANORO ELLIPTA 62.5-25 MCG/ACT AEPB USE 1 INHALATION DAILY Patient taking differently: Inhale 1 puff into the lungs daily. 04/18/21  Yes Binnie Rail, MD  Aspirin 81 MG EC tablet Take 81 mg by mouth daily.   Yes [provider]  Cholecalciferol (VITAMIN D3) 50 MCG (2000 UT) TABS Take 2,000 Units by mouth daily.   Yes [provider]  Cyanocobalamin (VITAMIN B12 PO) Take 1 tablet by mouth daily.   Yes [provider]  enalapril (VASOTEC) 10 MG tablet TAKE 1 TABLET TWICE A DAY Patient taking differently: Take 10 mg by mouth 2 (two) times daily. 08/28/21  Yes Burns, [name], MD  escitalopram (LEXAPRO) 5 MG tablet Take 1 tablet (5 mg total) by mouth every  morning. 09/27/21  Yes Burns, [name], MD  furosemide (LASIX) 40 MG tablet Take 1 tablet (40 mg total) by mouth daily. Patient taking differently: Take 40 mg by mouth daily as needed for fluid or edema. 12/27/21  Yes Burnell Blanks, MD  Magnesium Oxide 420 MG TABS Take 420 mg by mouth daily.    Yes [provider]  memantine (NAMENDA) 10 MG tablet Take 1 tablet (10 mg) twice a day Patient taking differently: Take 5 mg by mouth in the morning. 01/25/22  Yes Wertman, Coralee Pesa, PA-C  mirtazapine (REMERON) 15 MG tablet TAKE 1 TABLET(15 MG) BY MOUTH AT BEDTIME Patient taking differently: Take 15 mg by mouth at bedtime. 04/02/22  Yes Burns, [name], MD  simvastatin (ZOCOR) 40 MG tablet TAKE 1 TABLET DAILY Patient taking differently: Take 40 mg by mouth daily. 04/18/21  Yes Burns, [name], MD  SYSTANE COMPLETE PF 0.6 % SOLN Place 1 drop into both eyes 4 (four) times daily.   Yes [provider]  triamcinolone cream (KENALOG) 0.1 % Apply 1 application  topically daily as needed (for dryness/itching).   Yes [provider]  ACCU-CHEK COMPACT STRIPS test strip TEST BLOOD SUGAR ONCE DAILY 08/18/11   Hendricks Limes, MD  cholecalciferol (VITAMIN D) 1000 units tablet Take 3 tablets (3,000 Units total) by mouth daily. Patient not taking: Reported on 04/02/2022  03/30/22   Binnie Rail, MD      Allergies    Meperidine hcl, Sertraline, Gabapentin, Memantine, and Zolpidem tartrate    Review of Systems   Review of Systems  Constitutional:  Positive for fatigue. Negative for fever.  Respiratory:  Negative for shortness of breath.   Cardiovascular:  Negative for chest pain.  Gastrointestinal:  Positive for blood in stool (black). Negative for abdominal pain.    Physical Exam Updated Vital Signs BP (!) 160/74   Pulse (!) 31   Temp 97.9 F (36.6 C) (Oral)   Resp 17   Ht 5\' 11"  (1.803 m)   Wt 67.1 kg   SpO2 99%   BMI 20.64 kg/m  Physical Exam Vitals and nursing note  reviewed. Exam conducted with a chaperone present.  Constitutional:      Appearance: He is well-developed.  HENT:     Head: Normocephalic and atraumatic.  Cardiovascular:     Rate and Rhythm: Normal rate and regular rhythm.     Heart sounds: Normal heart sounds.  Pulmonary:     Effort: Pulmonary effort is normal.     Breath sounds: Normal breath sounds.  Abdominal:     General: There is no distension.     Palpations: Abdomen is soft.     Tenderness: There is no abdominal tenderness.  Genitourinary:    Rectum: Guaiac result positive. No tenderness or external hemorrhoid.     Comments: Dark but brown stool on DRE Skin:    General: Skin is warm and dry.  Neurological:     Mental Status: He is alert.     ED Results / Procedures / Treatments   Labs (all labs ordered are listed, but only abnormal results are displayed) Labs Reviewed  CBC WITH DIFFERENTIAL/PLATELET - Abnormal; Notable for the following components:      Result Value   RBC 3.25 (*)    Hemoglobin 8.3 (*)    HCT 28.6 (*)    MCH 25.5 (*)    MCHC 29.0 (*)    RDW 19.9 (*)    All other components within normal limits  COMPREHENSIVE METABOLIC PANEL - Abnormal; Notable for the following components:   Potassium 5.4 (*)    Glucose, Bld 141 (*)    BUN 44 (*)    Creatinine, Ser 1.89 (*)    Calcium 8.8 (*)    GFR, Estimated 34 (*)    All other components within normal limits  URINALYSIS, ROUTINE W REFLEX MICROSCOPIC - Abnormal; Notable for the following components:   Color, Urine STRAW (*)    Protein, ur 30 (*)    All other components within normal limits  POC OCCULT BLOOD, ED - Abnormal; Notable for the following components:   Fecal Occult Bld POSITIVE (*)    All other components within normal limits  LIPASE, BLOOD    EKG EKG Interpretation  Date/Time:  Monday April 02 2022 13:45:01 EST Ventricular Rate:  47 PR Interval:  248 QRS Duration: 142 QT Interval:  496 QTC Calculation: 368 R Axis:   -84 Text  Interpretation: Sinus bradycardia Supraventricular bigeminy Prolonged PR interval RBBB and LAFB Confirmed by Wynona Dove ([id number]) on 04/02/2022 2:33:00 PM  Radiology No results found.  Procedures Procedures    Medications Ordered in ED Medications  sodium chloride 0.9 % bolus 1,000 mL (0 mLs Intravenous Stopped 04/02/22 1528)  pantoprazole (PROTONIX) injection 40 mg (40 mg Intravenous Given 04/02/22 1727)    ED Course/ Medical Decision Making/ A&P  Medical Decision Making Amount and/or Complexity of Data Reviewed Labs:     Details: Hemoglobin of 8.3 is mildly lower than a few days ago but also a couple points lower than his baseline potassium is 5.4 with CKD with an elevated BUN compared to baseline at 44. ECG/medicine tests: ordered and independent interpretation performed.    Details: Sinus bradycardia  Risk Prescription drug management. Decision regarding hospitalization.   I suspect his fatigue is related to a GI bleed which seems to be relatively slow in course though it has dropped 2 point count and his hemoglobin.  Will give IV Protonix given the elevated BUN.  I discussed with Dr. Tarri Glenn of GI and GI will consult.  Otherwise I discussed with Dr. Lorin Mercy who will admit.  Does not appear to need an emergent blood transfusion.        Final Clinical Impression(s) / ED Diagnoses Final diagnoses:  Symptomatic anemia    Rx / DC Orders ED Discharge Orders     None         Sherwood Gambler, MD 04/02/22 2013

## 2022-04-02 NOTE — Telephone Encounter (Signed)
Pt daughter called wanted a call back, concerning about blood in stool she wants to know if she need to take pt (Father) to the ER. Please give pt daughter a call back at [phone]

## 2022-04-02 NOTE — ED Triage Notes (Signed)
Pt was at PCP 4 days ago with decreased hgg, increased potassium, increased BUN and creat advised to come to ER. Pt states dark tarry stools for months. Increased fatigue. Denies any falls. Denies lightheaded

## 2022-04-02 NOTE — ED Provider Triage Note (Signed)
Emergency Medicine Provider Triage Evaluation Note  Brian [name] , a 89 y.o. male  was evaluated in triage.  Pt complains of abnormal labs.  Patient has his regular visit with his PCP, was found to have hemoglobin of 8.6, potassium 5.9 and elevated kidney function with BUN 41 and creatinine 1.77.  Per daughter patient reports seeing dark tarry stool but not sure how long he has this symptom.  History of COPD on 3 L oxygen at home.  Denies abdominal pain, chest pain, shortness of breath, nausea, vomiting, fever.  Review of Systems  Positive: As above Negative: As above  Physical Exam  BP (!) 151/66 (BP Location: Right Arm)   Pulse 65   Temp 98 F (36.7 C) (Oral)   Resp 14   Ht 5\' 11"  (1.803 m)   Wt 67.1 kg   SpO2 99%   BMI 20.64 kg/m  Gen:   Awake, no distress   Resp:  Normal effort  MSK:   Moves extremities without difficulty  Other:    Medical Decision Making  Medically screening exam initiated at 12:43 PM.  Appropriate orders placed.  Brian [name] was informed that the remainder of the evaluation will be completed by another provider, this initial triage assessment does not replace that evaluation, and the importance of remaining in the ED until their evaluation is complete.    Rex Kras, [place] 04/02/22 2327

## 2022-04-03 DIAGNOSIS — I252 Old myocardial infarction: Secondary | ICD-10-CM | POA: Diagnosis not present

## 2022-04-03 DIAGNOSIS — K5730 Diverticulosis of large intestine without perforation or abscess without bleeding: Secondary | ICD-10-CM | POA: Diagnosis present

## 2022-04-03 DIAGNOSIS — I129 Hypertensive chronic kidney disease with stage 1 through stage 4 chronic kidney disease, or unspecified chronic kidney disease: Secondary | ICD-10-CM | POA: Diagnosis present

## 2022-04-03 DIAGNOSIS — D500 Iron deficiency anemia secondary to blood loss (chronic): Secondary | ICD-10-CM

## 2022-04-03 DIAGNOSIS — F0284 Dementia in other diseases classified elsewhere, unspecified severity, with anxiety: Secondary | ICD-10-CM | POA: Diagnosis present

## 2022-04-03 DIAGNOSIS — E785 Hyperlipidemia, unspecified: Secondary | ICD-10-CM | POA: Diagnosis present

## 2022-04-03 DIAGNOSIS — K5520 Angiodysplasia of colon without hemorrhage: Secondary | ICD-10-CM

## 2022-04-03 DIAGNOSIS — K922 Gastrointestinal hemorrhage, unspecified: Secondary | ICD-10-CM | POA: Diagnosis not present

## 2022-04-03 DIAGNOSIS — G309 Alzheimer's disease, unspecified: Secondary | ICD-10-CM | POA: Diagnosis present

## 2022-04-03 DIAGNOSIS — E875 Hyperkalemia: Secondary | ICD-10-CM | POA: Diagnosis present

## 2022-04-03 DIAGNOSIS — Z951 Presence of aortocoronary bypass graft: Secondary | ICD-10-CM | POA: Diagnosis not present

## 2022-04-03 DIAGNOSIS — J4489 Other specified chronic obstructive pulmonary disease: Secondary | ICD-10-CM | POA: Diagnosis present

## 2022-04-03 DIAGNOSIS — K31811 Angiodysplasia of stomach and duodenum with bleeding: Secondary | ICD-10-CM | POA: Diagnosis not present

## 2022-04-03 DIAGNOSIS — D62 Acute posthemorrhagic anemia: Secondary | ICD-10-CM | POA: Diagnosis present

## 2022-04-03 DIAGNOSIS — F410 Panic disorder [episodic paroxysmal anxiety] without agoraphobia: Secondary | ICD-10-CM | POA: Diagnosis present

## 2022-04-03 DIAGNOSIS — Z87891 Personal history of nicotine dependence: Secondary | ICD-10-CM | POA: Diagnosis not present

## 2022-04-03 DIAGNOSIS — K5521 Angiodysplasia of colon with hemorrhage: Secondary | ICD-10-CM | POA: Diagnosis present

## 2022-04-03 DIAGNOSIS — E1122 Type 2 diabetes mellitus with diabetic chronic kidney disease: Secondary | ICD-10-CM | POA: Diagnosis present

## 2022-04-03 DIAGNOSIS — Z9981 Dependence on supplemental oxygen: Secondary | ICD-10-CM | POA: Diagnosis not present

## 2022-04-03 DIAGNOSIS — F0154 Vascular dementia, unspecified severity, with anxiety: Secondary | ICD-10-CM | POA: Diagnosis present

## 2022-04-03 DIAGNOSIS — I110 Hypertensive heart disease with heart failure: Secondary | ICD-10-CM | POA: Diagnosis not present

## 2022-04-03 DIAGNOSIS — J9611 Chronic respiratory failure with hypoxia: Secondary | ICD-10-CM | POA: Diagnosis present

## 2022-04-03 DIAGNOSIS — K921 Melena: Secondary | ICD-10-CM | POA: Diagnosis present

## 2022-04-03 DIAGNOSIS — Z833 Family history of diabetes mellitus: Secondary | ICD-10-CM | POA: Diagnosis not present

## 2022-04-03 DIAGNOSIS — Z923 Personal history of irradiation: Secondary | ICD-10-CM | POA: Diagnosis not present

## 2022-04-03 DIAGNOSIS — Z8679 Personal history of other diseases of the circulatory system: Secondary | ICD-10-CM | POA: Diagnosis not present

## 2022-04-03 DIAGNOSIS — N1832 Chronic kidney disease, stage 3b: Secondary | ICD-10-CM | POA: Diagnosis present

## 2022-04-03 DIAGNOSIS — Z8249 Family history of ischemic heart disease and other diseases of the circulatory system: Secondary | ICD-10-CM | POA: Diagnosis not present

## 2022-04-03 DIAGNOSIS — I2510 Atherosclerotic heart disease of native coronary artery without angina pectoris: Secondary | ICD-10-CM | POA: Diagnosis present

## 2022-04-03 DIAGNOSIS — Z955 Presence of coronary angioplasty implant and graft: Secondary | ICD-10-CM | POA: Diagnosis not present

## 2022-04-03 LAB — IRON AND TIBC
Iron: 21 ug/dL — ABNORMAL LOW (ref 45–182)
Saturation Ratios: 5 % — ABNORMAL LOW (ref 17.9–39.5)
TIBC: 393 ug/dL (ref 250–450)
UIBC: 372 ug/dL

## 2022-04-03 LAB — BASIC METABOLIC PANEL
Anion gap: 6 (ref 5–15)
BUN: 31 mg/dL — ABNORMAL HIGH (ref 8–23)
CO2: 23 mmol/L (ref 22–32)
Calcium: 8.3 mg/dL — ABNORMAL LOW (ref 8.9–10.3)
Chloride: 110 mmol/L (ref 98–111)
Creatinine, Ser: 1.64 mg/dL — ABNORMAL HIGH (ref 0.61–1.24)
GFR, Estimated: 40 mL/min — ABNORMAL LOW (ref >60)
Glucose, Bld: 80 mg/dL (ref 70–99)
Potassium: 5.5 mmol/L — ABNORMAL HIGH (ref 3.5–5.1)
Sodium: 139 mmol/L (ref 135–145)

## 2022-04-03 LAB — HEMOGLOBIN AND HEMATOCRIT, BLOOD
HCT: 25.7 % — ABNORMAL LOW (ref 39.0–52.0)
Hemoglobin: 7.4 g/dL — ABNORMAL LOW (ref 13.0–17.0)

## 2022-04-03 LAB — CBC
HCT: 25.2 % — ABNORMAL LOW (ref 39.0–52.0)
Hemoglobin: 7.2 g/dL — ABNORMAL LOW (ref 13.0–17.0)
MCH: 25.8 pg — ABNORMAL LOW (ref 26.0–34.0)
MCHC: 28.6 g/dL — ABNORMAL LOW (ref 30.0–36.0)
MCV: 90.3 fL (ref 80.0–100.0)
Platelets: 142 10*3/uL — ABNORMAL LOW (ref 150–400)
RBC: 2.79 MIL/uL — ABNORMAL LOW (ref 4.22–5.81)
RDW: 19.9 % — ABNORMAL HIGH (ref 11.5–15.5)
WBC: 4.7 10*3/uL (ref 4.0–10.5)
nRBC: 0 % (ref 0.0–0.2)

## 2022-04-03 LAB — POTASSIUM: Potassium: 5.3 mmol/L — ABNORMAL HIGH (ref 3.5–5.1)

## 2022-04-03 MED ORDER — FERROUS SULFATE 325 (65 FE) MG PO TABS
325.0000 mg | ORAL_TABLET | Freq: Every day | ORAL | Status: DC
  Administered 2022-04-05: 325 mg via ORAL
  Filled 2022-04-03: qty 1

## 2022-04-03 MED ORDER — PEG-KCL-NACL-NASULF-NA ASC-C 100 G PO SOLR
0.5000 | Freq: Once | ORAL | Status: DC
  Filled 2022-04-03: qty 1

## 2022-04-03 MED ORDER — PEG-KCL-NACL-NASULF-NA ASC-C 100 G PO SOLR: 1.0000 | Freq: Once | ORAL | Status: DC

## 2022-04-03 MED ORDER — BISACODYL 5 MG PO TBEC
20.0000 mg | DELAYED_RELEASE_TABLET | Freq: Once | ORAL | Status: AC
  Administered 2022-04-03: 20 mg via ORAL
  Filled 2022-04-03: qty 4

## 2022-04-03 MED ORDER — METOCLOPRAMIDE HCL 5 MG/ML IJ SOLN: 10.0000 mg | Freq: Once | INTRAMUSCULAR | Status: DC

## 2022-04-03 MED ORDER — PANTOPRAZOLE SODIUM 40 MG IV SOLR
40.0000 mg | Freq: Two times a day (BID) | INTRAVENOUS | Status: DC
  Administered 2022-04-03 (×2): 40 mg via INTRAVENOUS
  Filled 2022-04-03 (×2): qty 10

## 2022-04-03 MED ORDER — METOCLOPRAMIDE HCL 5 MG/ML IJ SOLN
10.0000 mg | Freq: Once | INTRAMUSCULAR | Status: AC
  Administered 2022-04-03: 10 mg via INTRAVENOUS
  Filled 2022-04-03: qty 2

## 2022-04-03 MED ORDER — SODIUM ZIRCONIUM CYCLOSILICATE 10 G PO PACK
10.0000 g | PACK | Freq: Once | ORAL | Status: AC
  Administered 2022-04-03: 10 g via ORAL
  Filled 2022-04-03: qty 1

## 2022-04-03 MED ORDER — PEG-KCL-NACL-NASULF-NA ASC-C 100 G PO SOLR: 0.5000 | Freq: Once | ORAL | Status: DC

## 2022-04-03 MED ORDER — SODIUM CHLORIDE 0.9 % IV SOLN
125.0000 mg | Freq: Every day | INTRAVENOUS | Status: AC
  Administered 2022-04-03 – 2022-04-04 (×2): 125 mg via INTRAVENOUS
  Filled 2022-04-03 (×2): qty 10

## 2022-04-03 MED ORDER — PEG-KCL-NACL-NASULF-NA ASC-C 100 G PO SOLR
0.5000 | Freq: Once | ORAL | Status: AC
  Administered 2022-04-03: 100 g via ORAL

## 2022-04-03 MED ORDER — SODIUM CHLORIDE 0.9 % IV SOLN: 510.0000 mg | Freq: Once | INTRAVENOUS | Status: DC

## 2022-04-03 MED ORDER — PEG-KCL-NACL-NASULF-NA ASC-C 100 G PO SOLR
0.5000 | Freq: Once | ORAL | Status: AC
  Administered 2022-04-03: 100 g via ORAL
  Filled 2022-04-03: qty 1

## 2022-04-03 NOTE — TOC Progression Note (Signed)
Transition of Care [facility]) - Progression Note    Patient Details  Name: Brian [name] MRN: [id number] Date of Birth: 09-22-1931  Transition of Care [facility]) CM/SW [place], RN Phone [phone]  04/03/2022, 4:08 PM  Clinical Narrative:     Transition of Care [facility]) Screening Note   Patient Details  Name: Brian [name] Date of Birth: 25-Jan-1932   Transition of Care [facility]) CM/SW Contact:    Angelita Ingles, RN Phone Number: 04/03/2022, 4:09 PM    Transition of Care Department [facility]) has reviewed patient and no TOC needs have been identified at this time. We will continue to monitor patient advancement through interdisciplinary progression rounds. If new patient transition needs arise, please place a TOC consult.          Expected Discharge Plan and Services                                               Social Determinants of Health (SDOH) Interventions SDOH Screenings   Food Insecurity: No Food Insecurity (08/24/2021)  Housing: Low Risk  (08/24/2021)  Transportation Needs: No Transportation Needs (08/24/2021)  Alcohol Screen: Low Risk  (08/24/2021)  Depression (PHQ2-9): Low Risk  (03/30/2022)  Financial Resource Strain: Low Risk  (08/24/2021)  Physical Activity: Inactive (08/24/2021)  Social Connections: Moderately Integrated (08/24/2021)  Stress: No Stress Concern Present (08/24/2021)  Tobacco Use: Medium Risk (04/02/2022)    Readmission Risk Interventions     No data to display

## 2022-04-03 NOTE — Consult Note (Signed)
Consultation  Referring Provider: TRH/ Adhikari Primary Care Physician:  Binnie Rail, MD Primary Gastroenterologist:  Dr.Stark  Reason for Consultation:  GI bleed  HPI: Brian [name] is a 89 y.o. male, known remotely to Dr. Fuller Plan from colonoscopy in 2014 done for history of adenomatous colon polyps.  At that time he was noted to have a 4 mm nonbleeding cecal AVM which was not treated and one 5 mm polyp was removed from the ascending colon (adenomatous) Patient has not had prior EGD.  He reports that he has had dark stools off and on over the past several months which had not been occurring on a daily basis.  On careful questioning his stools have been more consistently dark over the past week or so.  He has not noticed any dark red blood or obvious heme.  He says he feels fine and has not noted any significant fatigue, no lightheadedness or chest pain.  He is on chronic oxygen at home at 3 to 3.5 L and has not noticed any increase in dyspnea. He has no complaints of abdominal pain, appetite has been okay no nausea or vomiting no dysphagia or odynophagia no heartburn or indigestion.  He has not noticed any change in his bowel habits, usually has 1-2 bowel movements per day. He has been on a baby aspirin daily no blood thinners, and no NSAIDs.  Reviewing his labs in July 2023 hemoglobin was 10.1 On 03/30/2022 hemoglobin 8.6/hematocrit 26 MCV 81 On 04/02/2022 hemoglobin 8.3/hematocrit 28.6 BUN 44/creatinine 1.85 and on 04/03/2022 hemoglobin 7.9/hematocrit 25.2  Iron 21/TIBC 393/iron sat 5  Patient has history of coronary artery disease status post prior CABG, abdominal aortic aneurysm status post remote stent graft, history of hypertension and adult onset diabetes mellitus, COPD with chronic oxygen use/3 to 4 L at home, and history of lung cancer status postradiation.   Past Medical History:  Diagnosis Date   AAA (abdominal aortic aneurysm) ([place])    Arthritis    Asthma 1976    Bronchiolitis 06/2014   CAD (coronary artery disease)    NSTEMI 02/2010;  s/p CABG 1/12 (L-LAD, SOM1, S-PDA); echo in 08/2010: Mild LVH, EF 88%, grade 2 diastolic dysfunction, mild MR, mild LAE, mildly reduced RV function, mild RAE, PASP 42   Carpal tunnel syndrome of left wrist    Colon polyps    COPD (chronic obstructive pulmonary disease) (HCC)    Diabetes mellitus 2000   Type II, controlls with diet   Dyspnea    ED (erectile dysfunction)    Gout    History of radiation therapy 03/09/2020-03/16/2020   SBRT right lung: Dr. Gery Pray   HTN (hypertension)    Hyperlipidemia    Inguinal hernia    Right   Macular degeneration    Myocardial infarction [facility]) 1 -13-2012   Neuropathy     Past Surgical History:  Procedure Laterality Date   3 vessel CABG  03/03/2010   ABDOMINAL AORTIC ENDOVASCULAR STENT GRAFT N/A 01/21/2019   Procedure: ABDOMINAL AORTIC ENDOVASCULAR STENT GRAFT;  Surgeon: Waynetta Sandy, MD;  Location: [facility];  Service: Vascular;  Laterality: N/A;   cataract left eye     COLONOSCOPY     neg 2009   COLONOSCOPY  2014   5 mm sessile polyp; AVM   CORONARY ARTERY BYPASS GRAFT  2012   X3   ENDOVASCULAR REPAIR/STENT GRAFT  01/21/2019   ABDOMINAL AORTIC ENDOVASCULAR STENT GRAFT (N/A )   EYE SURGERY  Left cataract   HERNIA REPAIR     INCISION AND DRAINAGE PERIRECTAL ABSCESS     INGUINAL HERNIA REPAIR      Prior to Admission medications   Medication Sig Start Date End Date Taking? Authorizing Provider  albuterol (VENTOLIN HFA) 108 (90 Base) MCG/ACT inhaler Inhale 2 puffs into the lungs every 6 (six) hours as needed for wheezing or shortness of breath. 01/15/22  Yes Burns, [name], MD  ANORO ELLIPTA 62.5-25 MCG/ACT AEPB USE 1 INHALATION DAILY Patient taking differently: Inhale 1 puff into the lungs daily. 04/18/21  Yes Binnie Rail, MD  Aspirin 81 MG EC tablet Take 81 mg by mouth daily.   Yes [provider]  Cholecalciferol (VITAMIN D3) 50 MCG  (2000 UT) TABS Take 2,000 Units by mouth daily.   Yes [provider]  Cyanocobalamin (VITAMIN B12 PO) Take 1 tablet by mouth daily.   Yes [provider]  enalapril (VASOTEC) 10 MG tablet TAKE 1 TABLET TWICE A DAY Patient taking differently: Take 10 mg by mouth 2 (two) times daily. 08/28/21  Yes Burns, [name], MD  escitalopram (LEXAPRO) 5 MG tablet Take 1 tablet (5 mg total) by mouth every morning. 09/27/21  Yes Burns, [name], MD  furosemide (LASIX) 40 MG tablet Take 1 tablet (40 mg total) by mouth daily. Patient taking differently: Take 40 mg by mouth daily as needed for fluid or edema. 12/27/21  Yes Burnell Blanks, MD  Magnesium Oxide 420 MG TABS Take 420 mg by mouth daily.    Yes [provider]  memantine (NAMENDA) 10 MG tablet Take 1 tablet (10 mg) twice a day Patient taking differently: Take 5 mg by mouth in the morning. 01/25/22  Yes Wertman, Coralee Pesa, PA-C  mirtazapine (REMERON) 15 MG tablet TAKE 1 TABLET(15 MG) BY MOUTH AT BEDTIME Patient taking differently: Take 15 mg by mouth at bedtime. 04/02/22  Yes Burns, [name], MD  simvastatin (ZOCOR) 40 MG tablet TAKE 1 TABLET DAILY Patient taking differently: Take 40 mg by mouth daily. 04/18/21  Yes Burns, [name], MD  SYSTANE COMPLETE PF 0.6 % SOLN Place 1 drop into both eyes 4 (four) times daily.   Yes [provider]  triamcinolone cream (KENALOG) 0.1 % Apply 1 application  topically daily as needed (for dryness/itching).   Yes [provider]  ACCU-CHEK COMPACT STRIPS test strip TEST BLOOD SUGAR ONCE DAILY 08/18/11   Hendricks Limes, MD    Current Facility-Administered Medications  Medication Dose Route Frequency Provider Last Rate Last Admin   acetaminophen (TYLENOL) tablet 650 mg  650 mg Oral Q6H PRN Karmen Bongo, MD       Or   acetaminophen (TYLENOL) suppository 650 mg  650 mg Rectal Q6H PRN Karmen Bongo, MD       albuterol (PROVENTIL) (2.5 MG/3ML) 0.083% nebulizer solution 2.5  mg  2.5 mg Nebulization Q6H PRN Karmen Bongo, MD       busPIRone (BUSPAR) tablet 5 mg  5 mg Oral BID PRN Karmen Bongo, MD       escitalopram (LEXAPRO) tablet 5 mg  5 mg Oral q morning Karmen Bongo, MD       hydrALAZINE (APRESOLINE) injection 5 mg  5 mg Intravenous Q4H PRN Karmen Bongo, MD       lactated ringers infusion   Intravenous Continuous Karmen Bongo, MD 100 mL/hr at 04/02/22 2316 New Bag at 04/02/22 2316   memantine (NAMENDA) tablet 5 mg  5 mg Oral Daily  Karmen Bongo, MD       mirtazapine (REMERON) tablet 15 mg  15 mg Oral Ivery Quale, MD   15 mg at 04/02/22 2314   morphine (PF) 2 MG/ML injection 2 mg  2 mg Intravenous Q2H PRN Karmen Bongo, MD       ondansetron [facility]) tablet 4 mg  4 mg Oral Q6H PRN Karmen Bongo, MD       Or   ondansetron [facility]) injection 4 mg  4 mg Intravenous Q6H PRN Karmen Bongo, MD       polyvinyl alcohol (LIQUIFILM TEARS) 1.4 % ophthalmic solution 1 drop  1 drop Both Eyes QID Karmen Bongo, MD       simvastatin (ZOCOR) tablet 40 mg  40 mg Oral Daily Karmen Bongo, MD       sodium chloride flush (NS) 0.9 % injection 3 mL  3 mL Intravenous Q12H Karmen Bongo, MD   3 mL at 04/02/22 2320   sodium zirconium cyclosilicate (LOKELMA) packet 10 g  10 g Oral Once Shelly Coss, MD       umeclidinium-vilanterol (ANORO ELLIPTA) 62.5-25 MCG/ACT 1 puff  1 puff Inhalation Daily Karmen Bongo, MD   1 puff at 04/03/22 0748    Allergies as of 04/02/2022 - Review Complete 04/02/2022  Allergen Reaction Noted   Meperidine hcl Swelling and Other (See Comments) 09/24/2006   Sertraline Shortness Of Breath and Other (See Comments) 05/01/2021   Gabapentin Other (See Comments) 05/11/2019   Memantine Other (See Comments) 04/02/2022   Zolpidem tartrate Other (See Comments)     Family History  Problem Relation Age of Onset   Heart failure Mother    Heart disease Mother        CHF   Heart attack Mother    Hypertension Mother    Heart  attack Father    Heart disease Father 52       MI   Hypertension Father    Heart attack Sister 51   Heart disease Sister 51       MI   Diabetes Sister    Hyperlipidemia Sister    Hypertension Sister    Varicose Veins Sister    Heart attack Brother 17   Diabetes Brother    Heart disease Brother 49       MI   Cancer Brother    Hypertension Brother    Deep vein thrombosis Daughter    Leukemia Daughter    Other Son        varicose veins   Arthritis/Rheumatoid Son    Lupus Daughter    Colon cancer Neg Hx     Social History   Socioeconomic History   Marital status: Widowed    Spouse name: Not on file   Number of children: 3   Years of education: 49   Highest education level: Not on file  Occupational History   Occupation: RETIRED    Employer: RETIRED  Tobacco Use   Smoking status: Former    Packs/day: 1.00    Years: 40.00    Total pack years: 40.00    Types: Cigarettes    Quit date: 02/20/2003    Years since quitting: 19.1   Smokeless tobacco: Never  Vaping Use   Vaping Use: Never used  Substance and Sexual Activity   Alcohol use: Not Currently    Alcohol/week: 6.0 standard drinks of alcohol    Types: 3 Glasses of wine, 3 Shots of liquor per week    Comment:  1-3  drinks a week   Drug use: No   Sexual activity: Not on file  Other Topics Concern   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    One story home   Right handed   3 children   Drinks caffeine   Social Determinants of Health   Financial Resource Strain: Low Risk  (08/24/2021)   Overall Financial Resource Strain (CARDIA)    Difficulty of Paying Living Expenses: Not hard at all  Food Insecurity: No Food Insecurity (08/24/2021)   Hunger Vital Sign    Worried About Running Out of Food in the Last Year: Never true    Ran Out of Food in the Last Year: Never true  Transportation Needs: No Transportation Needs (08/24/2021)   PRAPARE - Armed forces logistics/support/administrative officer (Medical): No    Lack of Transportation (Non-Medical): No  Physical Activity: Inactive (08/24/2021)   Exercise Vital Sign    Days of Exercise per Week: 0 days    Minutes of Exercise per Session: 0 min  Stress: No Stress Concern Present (08/24/2021)   [place]    Feeling of Stress : Not at all  Social Connections: Moderately Integrated (08/24/2021)   Social Connection and Isolation Panel [NHANES]    Frequency of Communication with Friends and Family: More than three times a week    Frequency of Social Gatherings with Friends and Family: More than three times a week    Attends Religious Services: More than 4 times per year    Active Member of [employer] or Organizations: Yes    Attends Archivist Meetings: More than 4 times per year    Marital Status: Widowed  Intimate Partner Violence: Not At Risk (08/24/2021)   Humiliation, Afraid, Rape, and Kick questionnaire    Fear of Current or Ex-Partner: No    Emotionally Abused: No    Physically Abused: No    Sexually Abused: No    Review of Systems: Pertinent positive and negative review of systems were noted in the above HPI section.  All other review of systems was otherwise negative.   Physical Exam: Vital signs in last 24 hours: Temp:  [97.7 F (36.5 C)-98.5 F (36.9 C)] 97.7 F (36.5 C) (02/13 0424) Pulse Rate:  [29-90] 61 (02/13 0424) Resp:  [14-21] 15 (02/13 0424) BP: (137-191)/(55-92) 137/55 (02/13 0424) SpO2:  [91 %-100 %] 96 % (02/13 0749) Weight:  [66.8 kg-67.1 kg] 66.8 kg (02/12 2228) Last BM Date :  (PTA) General:   Alert,  Well-developed, well-nourished, pleasant and cooperative in NAD Head:  Normocephalic and atraumatic. Eyes:  Sclera clear, no icterus.   Conjunctiva pink. Ears:  Normal auditory acuity. Nose:  No deformity, discharge,  or lesions. Mouth:  No deformity or lesions.   Neck:  Supple; no masses or  thyromegaly. Lungs:  Clear throughout to auscultation.   No wheezes, crackles, or rhonchi. Heart:  Regular rate and rhythm; no murmurs, clicks, rubs,  or gallops. Abdomen:  Soft,nontender, BS active,nonpalp mass or hsm.   Rectal:  Deferred  Msk:  Symmetrical without gross deformities. . Pulses:  Normal pulses noted. Extremities:  Without clubbing or edema. Neurologic:  Alert and  oriented x4;  grossly normal neurologically. Skin:  Intact without significant lesions or rashes.. Psych:  Alert and cooperative. Normal mood and affect.  Intake/Output from previous day: 02/12 0701 - 02/13 0700 In:  1250 [P.O.:250; IV Piggyback:1000] Out: 400 [Urine:400] Intake/Output this shift: No intake/output data recorded.  Lab Results: Recent Labs    04/02/22 1258 04/03/22 0616  WBC 4.4 4.7  HGB 8.3* 7.2*  HCT 28.6* 25.2*  PLT 160 142*   BMET Recent Labs    04/02/22 1258 04/03/22 0616  NA 140 139  K 5.4* 5.5*  CL 106 110  CO2 24 23  GLUCOSE 141* 80  BUN 44* 31*  CREATININE 1.89* 1.64*  CALCIUM 8.8* 8.3*   LFT Recent Labs    04/02/22 1258  PROT 7.4  ALBUMIN 4.3  AST 15  ALT 11  ALKPHOS 50  BILITOT 0.5   PT/INR No results for input(s): "LABPROT", "INR" in the last 72 hours. Hepatitis Panel No results for input(s): "HEPBSAG", "HCVAB", "HEPAIGM", "HEPBIGM" in the last 72 hours.    IMPRESSION:  #56 89 year old African-American male with significant iron deficiency anemia, and drift in hemoglobin of 2 g over the past 6 months, heme positive stool.  Patient has noticed intermittent dark stools for several months no other GI symptoms  Etiology of his slow GI blood loss is not clear, query if this may be secondary to AVMs as he had a nonbleeding cecal AVM noted on colonoscopy in 2014.  He certainly may have small bowel and/or upper GI AVMs.  Also need to consider underlying neoplasm, chronic gastropathy.   #2 new diagnosis of iron deficiency anemia  #3 history of adenomatous  colon polyps-last colonoscopy 2014 no follow-up had been planned due to advanced age  #4 COPD with chronic oxygen use 3 to 4 L at home nasal cannula 5.  Coronary artery disease status post prior CABG 6.  History of abdominal aortic aneurysm status post prior stent graft 7.  Adult onset diabetes mellitus 8.  Hypertension 9.  Status post radiation for right lung cancer.  Plan; clear liquid diet today, n.p.o. after midnight Patient will be scheduled for colonoscopy and upper endoscopy with Dr. Carlean Purl for tomorrow 04/04/2022.  Procedures were discussed in detail with the patient, and by phone with his daughter, including indications risks and benefits and they are agreeable to proceed. Bowel prep later today.  Start oral iron supplementation, consider IV iron while hospitalized Continue to trend hemoglobin and transfuse for hemoglobin less than 7.5 due to advanced age and multiple cardiac comorbidities   Luqman Perrelli Shane Crutch 04/03/2022, 8:37 AM

## 2022-04-03 NOTE — Plan of Care (Signed)
Patient AOX4, VSS throughout shift.  All meds given on time as ordered.  Denied pain, c/o anxiety.  Provider notified.  Pt voided in urinal.  Bed alarm on.  Tele in place, fluids on-going.  POC maintained, will continue to monitor.  Problem: Education: Goal: Knowledge of General Education information will improve Description: Including pain rating scale, medication(s)/side effects and non-pharmacologic comfort measures Outcome: Progressing   Problem: Health Behavior/Discharge Planning: Goal: Ability to manage health-related needs will improve Outcome: Progressing   Problem: Clinical Measurements: Goal: Ability to maintain clinical measurements within normal limits will improve Outcome: Progressing Goal: Will remain free from infection Outcome: Progressing Goal: Diagnostic test results will improve Outcome: Progressing Goal: Respiratory complications will improve Outcome: Progressing Goal: Cardiovascular complication will be avoided Outcome: Progressing   Problem: Activity: Goal: Risk for activity intolerance will decrease Outcome: Progressing   Problem: Nutrition: Goal: Adequate nutrition will be maintained Outcome: Progressing   Problem: Coping: Goal: Level of anxiety will decrease Outcome: Progressing   Problem: Elimination: Goal: Will not experience complications related to bowel motility Outcome: Progressing Goal: Will not experience complications related to urinary retention Outcome: Progressing   Problem: Pain Managment: Goal: General experience of comfort will improve Outcome: Progressing   Problem: Safety: Goal: Ability to remain free from injury will improve Outcome: Progressing   Problem: Skin Integrity: Goal: Risk for impaired skin integrity will decrease Outcome: Progressing

## 2022-04-03 NOTE — Progress Notes (Signed)
PROGRESS NOTE  Brian [name]  [name] DOB: 08-14-1931 DOA: 04/02/2022 PCP: Brian Rail, MD   Brief Narrative: Patient is a 89 year old male with history of abdominal aortic aneurysm, coronary artery disease, COPD, hypertension, diabetes type 2, hyperlipidemia who presents with complaint of passage of black tarry stools from home.  Outpatient workup showed hemoglobin in the range of 8(baseline hemoglobin ranges from 10-11) and his PCP asked him to come to the emergency department for further evaluation.  On presentation, hemoglobin was in the range of 8.  FOBT was positive.  Lab work also showed potassium of 5.4.  GI consulted.  Plan for EGD/colonoscopy tomorrow  Assessment & Plan:  Principal Problem:   Acute upper GI bleeding Active Problems:   COPD (chronic obstructive pulmonary disease) (HCC)   Stage 3b chronic kidney disease (CKD) (HCC)   Panic attacks   Hypertension   Hyperlipidemia   CAD (coronary artery disease)   AAA (abdominal aortic aneurysm) without rupture s/p AAA repair   Diabetes mellitus type 2 without retinopathy ([place])   Mixed Alzheimer's and vascular dementia ([place])   Upper GI bleed: Presented with melena, fatigue.  Report of multiple black tarry stools.  FOBT positive.  Suspicious for upper GI bleed.  Started on Protonix IV Takes aspirin at home ,on  hold.  GI consulted. Found to have nonbleeding cecal AVM on colonoscopy in 2014.  AVM is a possibility.  Acute blood loss anemia: Hemoglobin at baseline of 10-11.  Presented with hemoglobin range of 8.  This morning hemoglobin in the range of 7. Continue to monitor H&H.  Transfuse if hemoglobin drops less than 7.  Iron studies showed iron deficiency, given a dose of IV iron.  Continue oral supplementation for tomorrow.  History of coronary artery disease: Takes aspirin at home, currently on hold.  Denies any anginal symptoms.  Also has history of abdominal aortic aneurysm status postrepair in 12/20.  Follows with  vascular surgery.  COPD/chronic hypoxic respiratory failure: On 3 L of oxygen at baseline at home.  Continue bronchodilators, home inhalers.  Not in exacerbation at present  Diabetes type 2: Recent A1c indicating good control.  Not on home medication at this time.  Hyperlipidemia: On simvastatin  Hypertension: On elanapril, currently on hold  Hyperlipidemia: On simvastatin  CKD stage IIIb: Currently kidney function at baseline.  Avoid nephrotoxins.  Baseline creatinine ranges from 1.4-1.9.  Hyperkalemia: Potassium 5.5 this morning.  Will give a dose of Lokelma.  Will check potassium later today          DVT prophylaxis:SCDs Start: 04/02/22 2103     Code Status: Full Code  Family Communication: Called and discussed with daughter on phone on 2/13  Patient status:Inatient  Patient is from :home  Anticipated discharge DG:LOVF  Estimated DC date:after full workup with GI.   Consultants: GI  Procedures: Plan for EGD/colonoscopy  tomorrow  Antimicrobials:  Anti-infectives (From admission, onward)    None       Subjective: Patient seen and examined at the bedside today.  Hemodynamically stable.  Looks comfortable, on 3 L which is his baseline.  Denies abdomen pain, nausea or vomiting.  No new episodes of hematemesis, hematochezia or melena.  Objective: Vitals:   04/03/22 0000 04/03/22 0236 04/03/22 0424 04/03/22 0749  BP: (!) 160/63 (!) 162/56 (!) 137/55   Pulse: 60 62 61   Resp:  14 15   Temp:  98.5 F (36.9 C) 97.7 F (36.5 C)   TempSrc:  Oral Oral  SpO2: 93% 95% 97% 96%  Weight:      Height:        Intake/Output Summary (Last 24 hours) at 04/03/2022 0827 Last data filed at 04/03/2022 7001 Gross per 24 hour  Intake 1250 ml  Output 400 ml  Net 850 ml   Filed Weights   04/02/22 1219 04/02/22 2228  Weight: 67.1 kg 66.8 kg    Examination:  General exam: Overall comfortable, not in distress, pleasant elderly male HEENT: PERRL Respiratory system:   no wheezes or crackles  Cardiovascular system: S1 & S2 heard, RRR.  Gastrointestinal system: Abdomen is nondistended, soft and nontender. Central nervous system: Alert and oriented Extremities: No edema, no clubbing ,no cyanosis Skin: No rashes, no ulcers,no icterus     Data Reviewed: I have personally reviewed following labs and imaging studies  CBC: Recent Labs  Lab 03/30/22 1120 04/02/22 1258 04/03/22 0616  WBC 4.5 4.4 4.7  NEUTROABS 2.8 2.8  --   HGB 8.6 Repeated and verified X2.* 8.3* 7.2*  HCT 26.8* 28.6* 25.2*  MCV 81.7 88.0 90.3  PLT 154.0 160 749*   Basic Metabolic Panel: Recent Labs  Lab 03/30/22 1120 04/02/22 1258 04/03/22 0616  NA 142 140 139  K 5.9 No hemolysis seen* 5.4* 5.5*  CL 109 106 110  CO2 26 24 23   GLUCOSE 92 141* 80  BUN 41* 44* 31*  CREATININE 1.77* 1.89* 1.64*  CALCIUM 9.4 8.8* 8.3*     No results found for this or any previous visit (from the past 240 hour(s)).   Radiology Studies: No results found.  Scheduled Meds:  escitalopram  5 mg Oral q morning   memantine  5 mg Oral Daily   mirtazapine  15 mg Oral QHS   polyvinyl alcohol  1 drop Both Eyes QID   simvastatin  40 mg Oral Daily   sodium chloride flush  3 mL Intravenous Q12H   umeclidinium-vilanterol  1 puff Inhalation Daily   Continuous Infusions:  lactated ringers 100 mL/hr at 04/02/22 2316     LOS: 0 days   Shelly Coss, MD [facility] P2/13/2024, 8:27 AM

## 2022-04-04 ENCOUNTER — Inpatient Hospital Stay (HOSPITAL_COMMUNITY): Payer: Medicare Other | Admitting: Anesthesiology

## 2022-04-04 ENCOUNTER — Encounter (HOSPITAL_COMMUNITY)
Admission: EM | Disposition: A | Discharge status: Home / Self Care | Payer: Self-pay | Source: Home / Self Care | Attending: Internal Medicine

## 2022-04-04 ENCOUNTER — Encounter (HOSPITAL_COMMUNITY): Payer: Self-pay | Admitting: Internal Medicine

## 2022-04-04 DIAGNOSIS — K31811 Angiodysplasia of stomach and duodenum with bleeding: Secondary | ICD-10-CM

## 2022-04-04 DIAGNOSIS — D500 Iron deficiency anemia secondary to blood loss (chronic): Secondary | ICD-10-CM

## 2022-04-04 DIAGNOSIS — I110 Hypertensive heart disease with heart failure: Secondary | ICD-10-CM

## 2022-04-04 DIAGNOSIS — Z87891 Personal history of nicotine dependence: Secondary | ICD-10-CM

## 2022-04-04 DIAGNOSIS — I509 Heart failure, unspecified: Secondary | ICD-10-CM

## 2022-04-04 DIAGNOSIS — I252 Old myocardial infarction: Secondary | ICD-10-CM

## 2022-04-04 DIAGNOSIS — E1151 Type 2 diabetes mellitus with diabetic peripheral angiopathy without gangrene: Secondary | ICD-10-CM

## 2022-04-04 DIAGNOSIS — I2510 Atherosclerotic heart disease of native coronary artery without angina pectoris: Secondary | ICD-10-CM

## 2022-04-04 DIAGNOSIS — K5520 Angiodysplasia of colon without hemorrhage: Secondary | ICD-10-CM

## 2022-04-04 HISTORY — PX: HOT HEMOSTASIS: SHX5433

## 2022-04-04 HISTORY — PX: COLONOSCOPY WITH PROPOFOL: SHX5780

## 2022-04-04 HISTORY — PX: ESOPHAGOGASTRODUODENOSCOPY (EGD) WITH PROPOFOL: SHX5813

## 2022-04-04 HISTORY — DX: Angiodysplasia of colon without hemorrhage: K55.20

## 2022-04-04 LAB — CBC
HCT: 26.4 % — ABNORMAL LOW (ref 39.0–52.0)
Hemoglobin: 7.6 g/dL — ABNORMAL LOW (ref 13.0–17.0)
MCH: 25.8 pg — ABNORMAL LOW (ref 26.0–34.0)
MCHC: 28.8 g/dL — ABNORMAL LOW (ref 30.0–36.0)
MCV: 89.5 fL (ref 80.0–100.0)
Platelets: 139 10*3/uL — ABNORMAL LOW (ref 150–400)
RBC: 2.95 MIL/uL — ABNORMAL LOW (ref 4.22–5.81)
RDW: 19.7 % — ABNORMAL HIGH (ref 11.5–15.5)
WBC: 5.8 10*3/uL (ref 4.0–10.5)
nRBC: 0 % (ref 0.0–0.2)

## 2022-04-04 LAB — PREPARE RBC (CROSSMATCH)

## 2022-04-04 LAB — BASIC METABOLIC PANEL
Anion gap: 9 (ref 5–15)
BUN: 27 mg/dL — ABNORMAL HIGH (ref 8–23)
CO2: 20 mmol/L — ABNORMAL LOW (ref 22–32)
Calcium: 8.9 mg/dL (ref 8.9–10.3)
Chloride: 110 mmol/L (ref 98–111)
Creatinine, Ser: 1.59 mg/dL — ABNORMAL HIGH (ref 0.61–1.24)
GFR, Estimated: 41 mL/min — ABNORMAL LOW (ref >60)
Glucose, Bld: 99 mg/dL (ref 70–99)
Potassium: 5.4 mmol/L — ABNORMAL HIGH (ref 3.5–5.1)
Sodium: 139 mmol/L (ref 135–145)

## 2022-04-04 LAB — GLUCOSE, CAPILLARY: Glucose-Capillary: 84 mg/dL (ref 70–99)

## 2022-04-04 SURGERY — COLONOSCOPY WITH PROPOFOL
Anesthesia: Monitor Anesthesia Care

## 2022-04-04 MED ORDER — PROPOFOL 10 MG/ML IV BOLUS
INTRAVENOUS | Status: DC | PRN
  Administered 2022-04-04: 40 mg via INTRAVENOUS

## 2022-04-04 MED ORDER — LIDOCAINE HCL (CARDIAC) PF 100 MG/5ML IV SOSY
PREFILLED_SYRINGE | INTRAVENOUS | Status: DC | PRN
  Administered 2022-04-04: 100 mg via INTRAVENOUS

## 2022-04-04 MED ORDER — SODIUM ZIRCONIUM CYCLOSILICATE 10 G PO PACK
10.0000 g | PACK | Freq: Once | ORAL | Status: AC
  Administered 2022-04-04: 10 g via ORAL
  Filled 2022-04-04 (×2): qty 1

## 2022-04-04 MED ORDER — SODIUM CHLORIDE 0.9 % IV SOLN: INTRAVENOUS | Status: DC

## 2022-04-04 MED ORDER — SODIUM CHLORIDE 0.9% IV SOLUTION: Freq: Once | INTRAVENOUS | Status: DC

## 2022-04-04 MED ORDER — PHENYLEPHRINE 80 MCG/ML (10ML) SYRINGE FOR IV PUSH (FOR BLOOD PRESSURE SUPPORT)
PREFILLED_SYRINGE | INTRAVENOUS | Status: DC | PRN
  Administered 2022-04-04 (×4): 160 ug via INTRAVENOUS

## 2022-04-04 MED ORDER — LACTATED RINGERS IV SOLN: INTRAVENOUS | Status: DC | PRN

## 2022-04-04 SURGICAL SUPPLY — 25 items

## 2022-04-04 NOTE — Op Note (Signed)
[facility] Patient Name: Brian [name] Procedure Date: 04/04/2022 MRN: [id number] Attending MD: Gatha Mayer , MD, [id number] Date of Birth: 1931-11-08 CSN: [id number] Age: 89 Admit Type: Outpatient Procedure:                Upper GI endoscopy Indications:              Iron deficiency anemia secondary to chronic blood                            loss Providers:                Gatha Mayer, MD, Adah Perl RN, RN,                            William Dalton, Technician Referring MD:              Medicines:                Monitored Anesthesia Care Complications:            No immediate complications. Estimated Blood Loss:     Estimated blood loss: none. Procedure:                Pre-Anesthesia Assessment:                           - Prior to the procedure, a History and Physical                            was performed, and patient medications and                            allergies were reviewed. The patient's tolerance of                            previous anesthesia was also reviewed. The risks                            and benefits of the procedure and the sedation                            options and risks were discussed with the patient.                            All questions were answered, and informed consent                            was obtained. Prior Anticoagulants: The patient has                            taken no anticoagulant or antiplatelet agents. ASA                            Grade Assessment: IV - A patient with severe  systemic disease that is a constant threat to life.                            After reviewing the risks and benefits, the patient                            was deemed in satisfactory condition to undergo the                            procedure.                           After obtaining informed consent, the endoscope was                            passed under direct vision.  Throughout the                            procedure, the patient's blood pressure, pulse, and                            oxygen saturations were monitored continuously. The                            [id number]-H190 ([id number]) Olympus endoscope was introduced                            through the mouth, and advanced to the second part                            of duodenum. The upper GI endoscopy was                            accomplished without difficulty. The patient                            tolerated the procedure well. Scope In: Scope Out: Findings:      The esophagus was normal.      The stomach was normal.      The examined duodenum was normal.      The cardia and gastric fundus were normal on retroflexion. Impression:               - Normal esophagus.                           - Normal stomach.                           - Normal examined duodenum.                           - No specimens collected. Moderate Sedation:      Not Applicable - Patient had care per Anesthesia. Recommendation:           - See the other procedure note for documentation of  additional recommendations. Colonoscopy next Procedure Code(s):        --- Professional ---                           [phone], Esophagogastroduodenoscopy, flexible,                            transoral; diagnostic, including collection of                            specimen(s) by brushing or washing, when performed                            (separate procedure) Diagnosis Code(s):        --- Professional ---                           D50.0, Iron deficiency anemia secondary to blood                            loss (chronic) CPT copyright 2022 [employer] Association. All rights reserved. The codes documented in this report are preliminary and upon coder review may  be revised to meet current compliance requirements. Gatha Mayer, MD 04/04/2022 1:57:12 PM This report has been signed  electronically. Number of Addenda: 0

## 2022-04-04 NOTE — Progress Notes (Signed)
Patient ID: [name], male   DOB: 1932-01-01, 89 y.o.   MRN: [id number]    Progress Note   Subjective   Day # 2 CC;melena, anemia, heme +  Hgb 7.6 this am - stable Fe 21/Sat 5? TIBC 393  Received IV Fe  Patient tolerated prep without difficulty, says he is feeling fine, no overt melena or hematochezia with prep   Objective   Vital signs in last 24 hours: Temp:  [97.3 F (36.3 C)-98.5 F (36.9 C)] 98.5 F (36.9 C) (02/14 0551) Pulse Rate:  [51-66] 66 (02/14 0551) Resp:  [18] 18 (02/13 2022) BP: (137-189)/(54-63) 155/54 (02/14 0551) SpO2:  [92 %-99 %] 95 % (02/14 0756) Last BM Date : 04/04/22 General:    elderly   AA n NAD, O2 [place] Heart:  Regular rate and rhythm; no murmurs Lungs: Respirations even and unlabored, lungs CTA bilaterally, somewhat decreased breath sounds Abdomen:  Soft, nontender and nondistended. Normal bowel sounds. Extremities:  Without edema. Neurologic:  Alert and oriented,  grossly normal neurologically. Psych:  Cooperative. Normal mood and affect.  Intake/Output from previous day: 02/13 0701 - 02/14 0700 In: 1040 [P.O.:1040] Out: 350 [Urine:350] Intake/Output this shift: No intake/output data recorded.  Lab Results: Recent Labs    04/02/22 1258 04/03/22 0616 04/03/22 1656 04/04/22 0631  WBC 4.4 4.7  --  5.8  HGB 8.3* 7.2* 7.4* 7.6*  HCT 28.6* 25.2* 25.7* 26.4*  PLT 160 142*  --  139*   BMET Recent Labs    04/02/22 1258 04/03/22 0616 04/03/22 1656 04/04/22 0631  NA 140 139  --  139  K 5.4* 5.5* 5.3* 5.4*  CL 106 110  --  110  CO2 24 23  --  20*  GLUCOSE 141* 80  --  99  BUN 44* 31*  --  27*  CREATININE 1.89* 1.64*  --  1.59*  CALCIUM 8.8* 8.3*  --  8.9   LFT Recent Labs    04/02/22 1258  PROT 7.4  ALBUMIN 4.3  AST 15  ALT 11  ALKPHOS 50  BILITOT 0.5   PT/INR No results for input(s): "LABPROT", "INR" in the last 72 hours.       Assessment / Plan:    #63 89 year old male with significant iron deficiency  anemia, drift in hemoglobin of 2 g over the past 6 months and heme positive stool. Has noticed intermittent dark stools over the past several months, no other active GI symptoms.  Hemoglobin stable overnight at 7.6 has not required transfusions thus far Did receive IV iron yesterday  Tolerated bowel prep without difficulty  Etiology of slow GI blood loss and iron deficiency anemia not clear, he did have a previously noted AVM in the cecum from colonoscopy 2014, suspect he may have other intestinal AVMs, rule out occult neoplasm  #2 COPD/chronic oxygen use 3 to 4 L #3 coronary artery disease status post CABG #4.  History of abdominal aortic aneurysm status post prior stent graft #5.  Adult onset diabetes mellitus #6.  Hypertension #7.  History of adenomatous colon polyps-last colonoscopy 2014  Plan; patient is scheduled for colonoscopy and EGD with Dr. Carlean Purl today at 1 PM.  Further recommendations pending findings at endoscopy. Continue to trend hemoglobin and transfuse if patient has any further decline in hemoglobin given advanced age and comorbidities         Principal Problem:   Acute upper GI bleeding Active Problems:   Hyperlipidemia   CAD (coronary artery disease)  Hypertension   AAA (abdominal aortic aneurysm) without rupture s/p AAA repair   COPD (chronic obstructive pulmonary disease) (HCC)   Stage 3b chronic kidney disease (CKD) (HCC)   Diabetes mellitus type 2 without retinopathy ([place])   Panic attacks   Mixed Alzheimer's and vascular dementia ([place])     LOS: 1 day   Katherleen Folkes PA-C 04/04/2022, 8:37 AM

## 2022-04-04 NOTE — Progress Notes (Signed)
Mobility Specialist - Progress Note   04/04/22 0939  Oxygen Therapy  O2 Device Nasal Cannula  O2 Flow Rate (L/min) 4 L/min  Mobility  Activity Ambulated with assistance in hallway  Level of Assistance Modified independent, requires aide device or extra time  Assistive Device Front wheel walker  Distance Ambulated (ft) 480 ft  Activity Response Tolerated well  Mobility Referral Yes  $Mobility charge 1 Mobility   Pt received in bed and agreeable to mobility. Pt visibly SOB throughout session, but had no complaints. Pt to bed after session with all needs met.    Pre-mobility: 76 HR, 91% SpO2 (4L [place]) During mobility: 76 HR, 95% SpO2 (4L [place]) Post-mobility: 73 HR, 91% SPO2 (4L )  Set designer

## 2022-04-04 NOTE — Anesthesia Preprocedure Evaluation (Addendum)
Anesthesia Evaluation  Patient identified by MRN, date of birth, ID band Patient awake    Reviewed: Allergy & Precautions, NPO status , Patient's Chart, lab work & pertinent test results  Airway Mallampati: II  TM Distance: >3 FB Neck ROM: Full    Dental  (+) Edentulous Upper, Edentulous Lower, Dental Advisory Given   Pulmonary shortness of breath, asthma , COPD, former smoker   Pulmonary exam normal breath sounds clear to auscultation       Cardiovascular hypertension, Pt. on medications pulmonary hypertension+ CAD, + Past MI, + Peripheral Vascular Disease and +CHF  Normal cardiovascular exam+ dysrhythmias  Rhythm:Regular Rate:Normal  Echo 08/2021  1. Left ventricular ejection fraction, by estimation, is 60 to 65%. The left ventricle has normal function. The left ventricle has no regional wall motion abnormalities. There is severe asymmetric left ventricular hypertrophy of the septal segment. Left  ventricular diastolic parameters are consistent with Grade I diastolic dysfunction (impaired relaxation).   2. Right ventricular systolic function is normal. The right ventricular size is mildly enlarged. There is mildly elevated pulmonary artery systolic pressure. The estimated right ventricular systolic pressure is 79.4 mmHg.   3. Right atrial size was mild to moderately dilated.   4. Mild bileaflet mitral valve prolapse, posterior > anterior. The mitral valve is myxomatous. Trivial mitral valve regurgitation. No evidence of mitral stenosis.   5. The aortic valve is grossly normal. There is mild calcification of the aortic valve. Aortic valve regurgitation is not visualized. No aortic stenosis is present.   6. The inferior vena cava is normal in size with greater than 50% respiratory variability, suggesting right atrial pressure of 3 mmHg.      Neuro/Psych  PSYCHIATRIC DISORDERS Anxiety    Dementia negative neurological ROS      GI/Hepatic negative GI ROS, Neg liver ROS,,,  Endo/Other  diabetes    Renal/GU Renal disease     Musculoskeletal  (+) Arthritis ,    Abdominal   Peds  Hematology negative hematology ROS (+)   Anesthesia Other Findings   Reproductive/Obstetrics                             Anesthesia Physical Anesthesia Plan  ASA: 4  Anesthesia Plan: MAC   Post-op Pain Management: Minimal or no pain anticipated   Induction: Intravenous  PONV Risk Score and Plan: TIVA, Propofol infusion and Treatment may vary due to age or medical condition  Airway Management Planned:   Additional Equipment:   Intra-op Plan:   Post-operative Plan:   Informed Consent: I have reviewed the patients History and Physical, chart, labs and discussed the procedure including the risks, benefits and alternatives for the proposed anesthesia with the patient or authorized representative who has indicated his/her understanding and acceptance.     Dental advisory given  Plan Discussed with: CRNA  Anesthesia Plan Comments:         Anesthesia Quick Evaluation

## 2022-04-04 NOTE — H&P (View-Only) (Signed)
Patient ID: Brian [name], male   DOB: [birth year], 89 y.o.   MRN: [id number]    Progress Note   Subjective   Day # 2 CC;melena, anemia, heme +  Hgb 7.6 this am - stable Fe 21/Sat 5? TIBC 393  Received IV Fe  Patient tolerated prep without difficulty, says he is feeling fine, no overt melena or hematochezia with prep   Objective   Vital signs in last 24 hours: Temp:  [97.3 F (36.3 C)-98.5 F (36.9 C)] 98.5 F (36.9 C) (02/14 0551) Pulse Rate:  [51-66] 66 (02/14 0551) Resp:  [18] 18 (02/13 2022) BP: (137-189)/(54-63) 155/54 (02/14 0551) SpO2:  [92 %-99 %] 95 % (02/14 0756) Last BM Date : 04/04/22 General:    elderly   AA n NAD, O2 Brian [name] Heart:  Regular rate and rhythm; no murmurs Lungs: Respirations even and unlabored, lungs CTA bilaterally, somewhat decreased breath sounds Abdomen:  Soft, nontender and nondistended. Normal bowel sounds. Extremities:  Without edema. Neurologic:  Alert and oriented,  grossly normal neurologically. Psych:  Cooperative. Normal mood and affect.  Intake/Output from previous day: 02/13 0701 - 02/14 0700 In: 1040 [P.O.:1040] Out: 350 [Urine:350] Intake/Output this shift: No intake/output data recorded.  Lab Results: Recent Labs    04/02/22 1258 04/03/22 0616 04/03/22 1656 04/04/22 0631  WBC 4.4 4.7  --  5.8  HGB 8.3* 7.2* 7.4* 7.6*  HCT 28.6* 25.2* 25.7* 26.4*  PLT 160 142*  --  139*   BMET Recent Labs    04/02/22 1258 04/03/22 0616 04/03/22 1656 04/04/22 0631  NA 140 139  --  139  K 5.4* 5.5* 5.3* 5.4*  CL 106 110  --  110  CO2 24 23  --  20*  GLUCOSE 141* 80  --  99  BUN 44* 31*  --  27*  CREATININE 1.89* 1.64*  --  1.59*  CALCIUM 8.8* 8.3*  --  8.9   LFT Recent Labs    04/02/22 1258  PROT 7.4  ALBUMIN 4.3  AST 15  ALT 11  ALKPHOS 50  BILITOT 0.5   PT/INR No results for input(s): "LABPROT", "INR" in the last 72 hours.       Assessment / Plan:    #59 89 year old male with significant iron deficiency  anemia, drift in hemoglobin of 2 g over the past 6 months and heme positive stool. Has noticed intermittent dark stools over the past several months, no other active GI symptoms.  Hemoglobin stable overnight at 7.6 has not required transfusions thus far Did receive IV iron yesterday  Tolerated bowel prep without difficulty  Etiology of slow GI blood loss and iron deficiency anemia not clear, he did have a previously noted AVM in the cecum from colonoscopy 2014, suspect he may have other intestinal AVMs, rule out occult neoplasm  #2 COPD/chronic oxygen use 3 to 4 L #3 coronary artery disease status post CABG #4.  History of abdominal aortic aneurysm status post prior stent graft #5.  Adult onset diabetes mellitus #6.  Hypertension #7.  History of adenomatous colon polyps-last colonoscopy 2014  Plan; patient is scheduled for colonoscopy and EGD with Dr. Carlean Purl today at 1 PM.  Further recommendations pending findings at endoscopy. Continue to trend hemoglobin and transfuse if patient has any further decline in hemoglobin given advanced age and comorbidities         Principal Problem:   Acute upper GI bleeding Active Problems:   Hyperlipidemia   CAD (coronary artery disease)  Hypertension   AAA (abdominal aortic aneurysm) without rupture s/p AAA repair   COPD (chronic obstructive pulmonary disease) (HCC)   Stage 3b chronic kidney disease (CKD) (HCC)   Diabetes mellitus type 2 without retinopathy ([place])   Panic attacks   Mixed Alzheimer's and vascular dementia (Brian [name])     LOS: 1 day   Brian Gudgel PA-C 04/04/2022, 8:37 AM

## 2022-04-04 NOTE — Op Note (Signed)
[facility] Patient Name: Brian [name] Procedure Date: 04/04/2022 MRN: [id number] Attending MD: Gatha Mayer , MD, [id number] Date of Birth: Oct 08, 1931 CSN: [id number] Age: 89 Admit Type: Outpatient Procedure:                Colonoscopy Indications:              Iron deficiency anemia secondary to chronic blood                            loss Providers:                Gatha Mayer, MD, Adah Perl RN, RN,                            William Dalton, Technician Referring MD:              Medicines:                Monitored Anesthesia Care Complications:            No immediate complications. Estimated Blood Loss:     Estimated blood loss: none. Procedure:                Pre-Anesthesia Assessment:                           - Prior to the procedure, a History and Physical                            was performed, and patient medications and                            allergies were reviewed. The patient's tolerance of                            previous anesthesia was also reviewed. The risks                            and benefits of the procedure and the sedation                            options and risks were discussed with the patient.                            All questions were answered, and informed consent                            was obtained. Prior Anticoagulants: The patient has                            taken no anticoagulant or antiplatelet agents. ASA                            Grade Assessment: IV - A patient with severe  systemic disease that is a constant threat to life.                            After reviewing the risks and benefits, the patient                            was deemed in satisfactory condition to undergo the                            procedure.                           - Prior to the procedure, a History and Physical                            was performed, and patient medications and                             allergies were reviewed. The patient's tolerance of                            previous anesthesia was also reviewed. The risks                            and benefits of the procedure and the sedation                            options and risks were discussed with the patient.                            All questions were answered, and informed consent                            was obtained. Prior Anticoagulants: The patient has                            taken no anticoagulant or antiplatelet agents. ASA                            Grade Assessment: IV - A patient with severe                            systemic disease that is a constant threat to life.                            After reviewing the risks and benefits, the patient                            was deemed in satisfactory condition to undergo the                            procedure.  After obtaining informed consent, the colonoscope                            was passed under direct vision. Throughout the                            procedure, the patient's blood pressure, pulse, and                            oxygen saturations were monitored continuously. The                            CF-HQ190L ([id number] Olympus colonoscope was                            introduced through the anus and advanced to the the                            terminal ileum, with identification of the                            appendiceal orifice and IC valve. The colonoscopy                            was performed without difficulty. The patient                            tolerated the procedure well. The quality of the                            bowel preparation was good. The ileocecal valve,                            appendiceal orifice, and rectum were photographed.                            The bowel preparation used was MoviPrep via split                            dose instruction. Scope In:  1:29:23 PM Scope Out: 1:47:01 PM Scope Withdrawal Time: 0 hours 9 minutes 52 seconds  Total Procedure Duration: 0 hours 17 minutes 38 seconds  Findings:      The perianal and digital rectal examinations were normal.      Two small angiodysplastic lesions without bleeding were found in the       cecum. Coagulation for tissue destruction using argon plasma was       successful. Right colon setting. Estimated blood loss: none.      Multiple diverticula were found in the sigmoid colon.      The exam was otherwise without abnormality on direct and retroflexion       views. Impression:               - Two non-bleeding colonic angiodysplastic lesions                            -  cecum. Treated with argon plasma coagulation                            (APC). Suspect these were cause of iron deficiency                            anemia secondary to chronic blood loss.                           - Diverticulosis in the sigmoid colon.                           - The examination was otherwise normal on direct                            and retroflexion views.                           - No specimens collected. Moderate Sedation:      Not Applicable - Patient had care per Anesthesia. Recommendation:           - Return patient to hospital ward for ongoing care.                           - Resume regular diet.                           - DC PPI (done)                           I called [name] his sig other and updated                           PO ferrous sulfate - would consider parenteral iron                            x 1 if not done + at his age and Hgb 7.5 I                            recommend one more U RBC                           GI signing off - f/u Hgb via PCP and see GI prn Procedure Code(s):        --- Professional ---                           [phone], Colonoscopy, flexible; with ablation of                            tumor(s), polyp(s), or other lesion(s) (includes                             pre- and post-dilation and guide wire passage, when  performed) Diagnosis Code(s):        --- Professional ---                           K55.20, Angiodysplasia of colon without hemorrhage                           D50.0, Iron deficiency anemia secondary to blood                            loss (chronic)                           K57.30, Diverticulosis of large intestine without                            perforation or abscess without bleeding CPT copyright 2022 [employer]. All rights reserved. The codes documented in this report are preliminary and upon coder review may  be revised to meet current compliance requirements. Gatha Mayer, MD 04/04/2022 2:03:25 PM This report has been signed electronically. Number of Addenda: 0

## 2022-04-04 NOTE — Transfer of Care (Signed)
Immediate Anesthesia Transfer of Care Note  Patient: Brian [name]  Procedure(s) Performed: COLONOSCOPY WITH PROPOFOL ESOPHAGOGASTRODUODENOSCOPY (EGD) WITH PROPOFOL HOT HEMOSTASIS (ARGON PLASMA COAGULATION/BICAP)  Patient Location: PACU and Endoscopy Unit  Anesthesia Type:MAC  Level of Consciousness: awake and drowsy  Airway & Oxygen Therapy: Patient Spontanous Breathing and Patient connected to face mask oxygen  Post-op Assessment: Report given to RN and Post -op Vital signs reviewed and stable  Post vital signs: Reviewed and stable  Last Vitals:  Vitals Value Taken Time  BP 106/18 04/04/22 1356  Temp 36.6 C 04/04/22 1352  Pulse 62 04/04/22 1357  Resp 18 04/04/22 1357  SpO2 100 % 04/04/22 1357  Vitals shown include unvalidated device data.  Last Pain:  Vitals:   04/04/22 1352  TempSrc: Temporal  PainSc:          Complications: No notable events documented.

## 2022-04-04 NOTE — Progress Notes (Signed)
PROGRESS NOTE  Brian [name]  TKP:[id number] DOB: 04-Mar-1931 DOA: 04/02/2022 PCP: Binnie Rail, MD   Brief Narrative: Patient is a 89 year old male with history of abdominal aortic aneurysm, coronary artery disease, COPD, hypertension, diabetes type 2, hyperlipidemia who presents with complaint of passage of black tarry stools from home.  Outpatient workup showed hemoglobin in the range of 8(baseline hemoglobin ranges from 10-11) and his PCP asked him to come to the emergency department for further evaluation.  On presentation, hemoglobin was in the range of 8.  FOBT was positive.  Lab work also showed potassium of 5.4.  GI consulted.  Plan for EGD/colonoscopy today  Assessment & Plan:  Principal Problem:   Acute upper GI bleeding Active Problems:   COPD (chronic obstructive pulmonary disease) (HCC)   Stage 3b chronic kidney disease (CKD) (HCC)   Panic attacks   Hypertension   Hyperlipidemia   CAD (coronary artery disease)   AAA (abdominal aortic aneurysm) without rupture s/p AAA repair   Diabetes mellitus type 2 without retinopathy ([place])   Mixed Alzheimer's and vascular dementia ([place])   Upper GI bleed: Presented with melena, fatigue.  Report of multiple black tarry stools.  FOBT positive.  Suspicious for upper GI bleed.  Started on Protonix IV Takes aspirin at home ,on  hold.  GI consulted. Found to have nonbleeding cecal AVM on colonoscopy in 2014.  AVM is a possibility.  Acute blood loss anemia: Hemoglobin at baseline of 10-11.  Presented with hemoglobin range of 8.  This morning hemoglobin in the range of 7. Continue to monitor H&H.  Transfuse if hemoglobin drops less than 7.  Iron studies showed iron deficiency, given a dose of IV iron.  Continue oral supplementation .  History of coronary artery disease: Takes aspirin at home, currently on hold.  Denies any anginal symptoms.  Also has history of abdominal aortic aneurysm status postrepair in 12/20.  Follows with vascular  surgery.  COPD/chronic hypoxic respiratory failure: On 3 L of oxygen at baseline at home.  Continue bronchodilators, home inhalers.  Not in exacerbation at present  Diabetes type 2: Recent A1c indicating good control.  Not on home medication at this time.  Hyperlipidemia: On simvastatin  Hypertension: On elanapril, currently on hold  Hyperlipidemia: On simvastatin  CKD stage IIIb: Currently kidney function at baseline.  Avoid nephrotoxins.  Baseline creatinine ranges from 1.4-1.9.  Hyperkalemia: Potassium 5.4 this morning.  Will  again give a dose of Lokelma.  Unclear etiology  Weakness: Most likely secondary to anemia, GI bleed.  Will consult PT when appropriate,likely for tomorrow mrng        DVT prophylaxis:SCDs Start: 04/02/22 2103     Code Status: Full Code  Family Communication: Called and discussed with daughter on phone on 2/13  Patient status:Inatient  Patient is from :home  Anticipated discharge NT:ZGYF  Estimated DC date:after full workup with GI.Likely tomorrow   Consultants: GI  Procedures: Plan for EGD/colonoscopy  tomorrow  Antimicrobials:  Anti-infectives (From admission, onward)    None       Subjective: Patient seen and examined at bedside today.  Hemodynamically stable.  Sitting at the edge of the bed.  He said he could not sleep last night.  Denies any abdomen pain, nausea or vomiting or any episode of hematochezia or melena.  Objective: Vitals:   04/03/22 1323 04/03/22 2022 04/04/22 0551 04/04/22 0756  BP: (!) 165/62 (!) 189/58 (!) 155/54   Pulse: (!) 56 (!) 51 66   Resp:  18 18    Temp:  (!) 97.3 F (36.3 C) 98.5 F (36.9 C)   TempSrc:  Oral Oral   SpO2: 92% 99% 94% 95%  Weight:      Height:        Intake/Output Summary (Last 24 hours) at 04/04/2022 1056 Last data filed at 04/04/2022 0600 Gross per 24 hour  Intake 1040 ml  Output 150 ml  Net 890 ml   Filed Weights   04/02/22 1219 04/02/22 2228  Weight: 67.1 kg 66.8 kg     Examination:   General exam: Overall comfortable, not in distress, pleasant elderly male HEENT: PERRL Respiratory system:  no wheezes or crackles  Cardiovascular system: S1 & S2 heard, RRR.  Gastrointestinal system: Abdomen is nondistended, soft and nontender. Central nervous system: Alert and oriented Extremities: No edema, no clubbing ,no cyanosis Skin: No rashes, no ulcers,no icterus     Data Reviewed: I have personally reviewed following labs and imaging studies  CBC: Recent Labs  Lab 03/30/22 1120 04/02/22 1258 04/03/22 0616 04/03/22 1656 04/04/22 0631  WBC 4.5 4.4 4.7  --  5.8  NEUTROABS 2.8 2.8  --   --   --   HGB 8.6 Repeated and verified X2.* 8.3* 7.2* 7.4* 7.6*  HCT 26.8* 28.6* 25.2* 25.7* 26.4*  MCV 81.7 88.0 90.3  --  89.5  PLT 154.0 160 142*  --  009*   Basic Metabolic Panel: Recent Labs  Lab 03/30/22 1120 04/02/22 1258 04/03/22 0616 04/03/22 1656 04/04/22 0631  NA 142 140 139  --  139  K 5.9 No hemolysis seen* 5.4* 5.5* 5.3* 5.4*  CL 109 106 110  --  110  CO2 26 24 23   --  20*  GLUCOSE 92 141* 80  --  99  BUN 41* 44* 31*  --  27*  CREATININE 1.77* 1.89* 1.64*  --  1.59*  CALCIUM 9.4 8.8* 8.3*  --  8.9     No results found for this or any previous visit (from the past 240 hour(s)).   Radiology Studies: No results found.  Scheduled Meds:  escitalopram  5 mg Oral q morning   ferrous sulfate  325 mg Oral Q breakfast   memantine  5 mg Oral Daily   mirtazapine  15 mg Oral QHS   pantoprazole (PROTONIX) IV  40 mg Intravenous Q12H   polyvinyl alcohol  1 drop Both Eyes QID   simvastatin  40 mg Oral Daily   sodium chloride flush  3 mL Intravenous Q12H   sodium zirconium cyclosilicate  10 g Oral Once   umeclidinium-vilanterol  1 puff Inhalation Daily   Continuous Infusions:  ferric gluconate (FERRLECIT) IVPB 125 mg (04/03/22 1539)     LOS: 1 day   Shelly Coss, MD [facility] P2/14/2024, 10:56 AM

## 2022-04-04 NOTE — Interval H&P Note (Signed)
History and Physical Interval Note:  04/04/2022 1:06 PM  Brian [name]  has presented today for surgery, with the diagnosis of GI bleed, anemia.  The various methods of treatment have been discussed with the patient and family. After consideration of risks, benefits and other options for treatment, the patient has consented to  Procedure(s): COLONOSCOPY WITH PROPOFOL (N/A) ESOPHAGOGASTRODUODENOSCOPY (EGD) WITH PROPOFOL (N/A) as a surgical intervention.  The patient's history has been reviewed, patient examined, no change in status, stable for surgery.  I have reviewed the patient's chart and labs.  Questions were answered to the patient's satisfaction.     [name]

## 2022-04-05 ENCOUNTER — Encounter: Payer: Self-pay | Admitting: Internal Medicine

## 2022-04-05 LAB — CBC
HCT: 24.8 % — ABNORMAL LOW (ref 39.0–52.0)
Hemoglobin: 7.3 g/dL — ABNORMAL LOW (ref 13.0–17.0)
MCH: 26.4 pg (ref 26.0–34.0)
MCHC: 29.4 g/dL — ABNORMAL LOW (ref 30.0–36.0)
MCV: 89.5 fL (ref 80.0–100.0)
Platelets: 155 10*3/uL (ref 150–400)
RBC: 2.77 MIL/uL — ABNORMAL LOW (ref 4.22–5.81)
RDW: 19.9 % — ABNORMAL HIGH (ref 11.5–15.5)
WBC: 5.3 10*3/uL (ref 4.0–10.5)
nRBC: 0 % (ref 0.0–0.2)

## 2022-04-05 LAB — BASIC METABOLIC PANEL
Anion gap: 7 (ref 5–15)
BUN: 29 mg/dL — ABNORMAL HIGH (ref 8–23)
CO2: 22 mmol/L (ref 22–32)
Calcium: 8.6 mg/dL — ABNORMAL LOW (ref 8.9–10.3)
Chloride: 110 mmol/L (ref 98–111)
Creatinine, Ser: 1.62 mg/dL — ABNORMAL HIGH (ref 0.61–1.24)
GFR, Estimated: 41 mL/min — ABNORMAL LOW (ref >60)
Glucose, Bld: 79 mg/dL (ref 70–99)
Potassium: 4.9 mmol/L (ref 3.5–5.1)
Sodium: 139 mmol/L (ref 135–145)

## 2022-04-05 LAB — PREPARE RBC (CROSSMATCH)

## 2022-04-05 MED ORDER — FERROUS SULFATE 325 (65 FE) MG PO TABS: 325.0000 mg | ORAL_TABLET | Freq: Every day | ORAL | 1 refills | Status: DC

## 2022-04-05 MED ORDER — SODIUM CHLORIDE 0.9% IV SOLUTION: Freq: Once | INTRAVENOUS | Status: AC

## 2022-04-05 NOTE — Anesthesia Postprocedure Evaluation (Signed)
Anesthesia Post Note  Patient: Brian [name]  Procedure(s) Performed: COLONOSCOPY WITH PROPOFOL ESOPHAGOGASTRODUODENOSCOPY (EGD) WITH PROPOFOL HOT HEMOSTASIS (ARGON PLASMA COAGULATION/BICAP)     Patient location during evaluation: PACU Anesthesia Type: MAC Level of consciousness: awake and alert Pain management: pain level controlled Vital Signs Assessment: post-procedure vital signs reviewed and stable Respiratory status: spontaneous breathing Cardiovascular status: stable Anesthetic complications: no  No notable events documented.  Last Vitals:  Vitals:   04/05/22 1024 04/05/22 1308  BP: (!) 160/59 (!) 168/65  Pulse: (!) 58 71  Resp: 18 18  Temp: 36.7 C 36.6 C  SpO2:  95%    Last Pain:  Vitals:   04/05/22 1308  TempSrc: Oral  PainSc:                  Nolon Nations

## 2022-04-05 NOTE — Evaluation (Signed)
Physical Therapy Evaluation Patient Details Name: Brian [name] MRN: [id number] DOB: May 08, 1931 Today's Date: 04/05/2022  History of Present Illness  Pt is an 89 y.o. male who presents with complaint of passage of black tarry stools from home, admitted for acute upper GI bleed. PMH significant for abdominal aortic aneurysm, coronary artery disease, COPD, hypertension, diabetes type 2, and hyperlipidemia   Clinical Impression  Pt is an 89 y.o. male presenting with above HPI.  Pt lives with his daughter and reports that he is mostly independent at baseline-daughter assist with medication and home management. Pt ambulated total of ~459ft with x1 standing rest break and supervision- modified independent and performed x3 stairs with supervision. Pt reports using 3L [place] with activity at home and 2L at night. Pt with O2 desat to 83-84% on 3L and when titrated to 4L [place]. Able to recover >90% with increased time/rest breaks and cues for deep breathing techniques. Pt denied feeling SOB,educated on use of finger pulse ox at home to keep track of O2 sats as pt is not symptomatic when O2 drops. Pt reports he is near baseline mobility level. No further skilled PT needs identified. Pt to continue mobilizing with nursing staff and mobility specialists during stay.  Pt will sign off.         Recommendations for follow up therapy are one component of a multi-disciplinary discharge planning process, led by the attending physician.  Recommendations may be updated based on patient status, additional functional criteria and insurance authorization.  Follow Up Recommendations No PT follow up      Assistance Recommended at Discharge PRN  Patient can return home with the following       Equipment Recommendations None recommended by PT  Recommendations for Other Services       Functional Status Assessment Patient has had a recent decline in their functional status and demonstrates the ability to make significant  improvements in function in a reasonable and predictable amount of time.     Precautions / Restrictions Precautions Precautions: Fall Restrictions Weight Bearing Restrictions: No      Mobility  Bed Mobility Overal bed mobility: Independent                  Transfers Overall transfer level: Independent Equipment used: None                    Ambulation/Gait Ambulation/Gait assistance: Supervision, Modified independent (Device/Increase time) Gait Distance (Feet): 400 Feet Assistive device: None Gait Pattern/deviations: Step-through pattern, Decreased stride length Gait velocity: decreased     General Gait Details: supervision progressed to modified independent. no overt LOB observed. on 4L upon entry with O2 sats at 95%. Pt placed on 3L baseline via [place], O2 desat to 83% with ambulation. Placed on 4L and O2 desat to 84%, recovery to >90% with increased time standing/seated rest breaks and cues for deep breathing. Pt denied feeling SOB.  Stairs Stairs: Yes Stairs assistance: Supervision Stair Management: One rail Left, Alternating pattern, Forwards Number of Stairs: 3 General stair comments: supervision for safety, no physical assist required.  Wheelchair Mobility    Modified Rankin (Stroke Patients Only)       Balance Overall balance assessment: Modified Independent                                           Pertinent Vitals/Pain Pain Assessment Pain  Assessment: No/denies pain    Home Living Family/patient expects to be discharged to:: Private residence Living Arrangements: Children (daughter) Available Help at Discharge: Family Type of Home: House Home Access: Stairs to enter Entrance Stairs-Rails: Can reach both Entrance Stairs-Number of Steps: 2   Home Layout: One level Home Equipment:  (Pt states that he has cane that he can open up and has seat) Additional Comments: lives with daughter who is retired    Prior Function  Prior Level of Function : Independent/Modified Independent;Driving             Mobility Comments: no AD use at baseline. Home O2, 3L with activity, at night 2L via [place] ADLs Comments: daughter assist with managing medications and home management     Hand Dominance        Extremity/Trunk Assessment   Upper Extremity Assessment Upper Extremity Assessment: Overall WFL for tasks assessed    Lower Extremity Assessment Lower Extremity Assessment: Generalized weakness    Cervical / Trunk Assessment Cervical / Trunk Assessment: Normal  Communication   Communication: No difficulties  Cognition Arousal/Alertness: Awake/alert Behavior During Therapy: WFL for tasks assessed/performed Overall Cognitive Status: Within Functional Limits for tasks assessed                                          General Comments      Exercises     Assessment/Plan    PT Assessment Patient does not need any further PT services  PT Problem List         PT Treatment Interventions      PT Goals (Current goals can be found in the Care Plan section)  Acute Rehab PT Goals Patient Stated Goal: Go home when able PT Goal Formulation: With patient Time For Goal Achievement: 04/19/22 Potential to Achieve Goals: Good    Frequency       Co-evaluation               AM-PAC PT "6 Clicks" Mobility  Outcome Measure Help needed turning from your back to your side while in a flat bed without using bedrails?: None Help needed moving from lying on your back to sitting on the side of a flat bed without using bedrails?: None Help needed moving to and from a bed to a chair (including a wheelchair)?: None Help needed standing up from a chair using your arms (e.g., wheelchair or bedside chair)?: None Help needed to walk in hospital room?: None Help needed climbing 3-5 steps with a railing? : None 6 Click Score: 24    End of Session Equipment Utilized During Treatment: Gait  belt;Oxygen Activity Tolerance: Patient tolerated treatment well Patient left: in bed;with call bell/phone within reach (pt reports he has been independently transferring to [facility]) Nurse Communication: Mobility status;Other (comment) (O2 sats) PT Visit Diagnosis: Muscle weakness (generalized) (M62.81);Unsteadiness on feet (R26.81)    Time: 4801-6553 PT Time Calculation (min) (ACUTE ONLY): 25 min   Charges:   PT Evaluation $PT Eval Low Complexity: 1 Low PT Treatments $Therapeutic Activity: 8-22 mins       Festus Barren PT, DPT  Acute Rehabilitation Services  Office [phone]   04/05/2022, 12:12 PM

## 2022-04-05 NOTE — Discharge Summary (Signed)
Physician Discharge Summary  Brian [name] QQV:[id number] DOB: 04-22-1931 DOA: 04/02/2022  PCP: Brian Rail, MD  Admit date: 04/02/2022 Discharge date: 04/05/2022  Admitted From: Home Disposition:  Home  Discharge Condition:Stable CODE STATUS:FULL Diet recommendation: Heart Healthy    Brief/Interim Summary:  Patient is a 89 year old male with history of abdominal aortic aneurysm, coronary artery disease, COPD, hypertension, diabetes type 2, hyperlipidemia who presents with complaint of passage of black tarry stools from home.  Outpatient workup showed hemoglobin in the range of 8(baseline hemoglobin ranges from 10-11) and his PCP asked him to come to the emergency department for further evaluation.  On presentation, hemoglobin was in the range of 8.  FOBT was positive.  Lab work also showed potassium of 5.4.  GI consulted.  S/p EGD/colonoscopy on 2/14 which showed 2 nonbleeding colonic angiodysplastic lesions, status post APC treatment.  He was also transfused with unit of PRBC during this hospitalization. Medically stable for discharge home today, GI cleared for discharge.   Following problems were addressed during the hospitalization:  Upper GI bleed: Presented with melena, fatigue.  Report of multiple black tarry stools.  FOBT positive.  Suspicious for upper GI bleed.  Started on Protonix IV GI consulted. Found to have nonbleeding cecal AVM on colonoscopy in 2/14.  Status post APC.  GI cleared for discharge.   Acute blood loss anemia: Hemoglobin at baseline of 10-11.  Presented with hemoglobin range of 8.  This morning hemoglobin in the range of 7. Iron studies showed iron deficiency, given a dose of IV iron.  Continue oral supplementation .  Also given a unit of blood transfusion during this hospitalization.   History of coronary artery disease: Takes aspirin at home. Denies any anginal symptoms.  Also has history of abdominal aortic aneurysm status postrepair in 12/20.  Follows  with vascular surgery.   COPD/chronic hypoxic respiratory failure: On 3 L of oxygen at baseline at home.  Continue bronchodilators, home inhalers.  Not in exacerbation at present   Diabetes type 2: Recent A1c indicating good control.  Not on home medication at this time.   Hypertension: On elanapril   Hyperlipidemia: On simvastatin   CKD stage IIIb: Currently kidney function at baseline.  Avoid nephrotoxins.  Baseline creatinine ranges from 1.4-1.9.   Hyperkalemia: Resolved with Lokelma  Weakness: Most likely secondary to anemia, GI bleed.  PT consulted, no follow recommended   discharge Diagnoses:  Principal Problem:   Acute upper GI bleeding Active Problems:   COPD (chronic obstructive pulmonary disease) (HCC)   Stage 3b chronic kidney disease (CKD) (HCC)   Panic attacks   Hypertension   Hyperlipidemia   CAD (coronary artery disease)   AAA (abdominal aortic aneurysm) without rupture s/p AAA repair   Diabetes mellitus type 2 without retinopathy ([place])   Mixed Alzheimer's and vascular dementia (Brian [name])   Iron deficiency anemia due to chronic blood loss   Cecal angiodysplasia    Discharge Instructions  Discharge Instructions     Diet - low sodium heart healthy   Complete by: As directed    Discharge instructions   Complete by: As directed    1)Please take prescribed medications as instructed 2)Follow up with your PCP in a week.  Do a CBC, BMP tests during the follow-up 3)Follow up with gastroenterology as an outpatient.   Increase activity slowly   Complete by: As directed       Allergies as of 04/05/2022       Reactions   Meperidine Hcl  Swelling, Other (See Comments)   Tongue swelling Because of a history of documented adverse serious drug reaction, Medi Alert bracelet  is recommended.   Sertraline Shortness Of Breath, Other (See Comments)   Burning sensation from feet to head, diff breathing   Gabapentin Other (See Comments)   Confusion   Memantine Other (See  Comments)   Can take only up to 5 mg a day- causes lethargy past that amount   Zolpidem Tartrate Other (See Comments)   Nightmares        Medication List     TAKE these medications    ACCU-CHEK COMPACT STRIPS test strip Generic drug: glucose blood TEST BLOOD SUGAR ONCE DAILY   albuterol 108 (90 Base) MCG/ACT inhaler Commonly known as: VENTOLIN HFA Inhale 2 puffs into the lungs every 6 (six) hours as needed for wheezing or shortness of breath.   Anoro Ellipta 62.5-25 MCG/ACT Aepb Generic drug: umeclidinium-vilanterol USE 1 INHALATION DAILY What changed: See the new instructions.   Aspirin 81 MG EC tablet Take 81 mg by mouth daily.   enalapril 10 MG tablet Commonly known as: VASOTEC TAKE 1 TABLET TWICE A DAY   escitalopram 5 MG tablet Commonly known as: Lexapro Take 1 tablet (5 mg total) by mouth every morning.   ferrous sulfate 325 (65 FE) MG tablet Take 1 tablet (325 mg total) by mouth daily with breakfast. Start taking on: April 06, 2022   furosemide 40 MG tablet Commonly known as: LASIX Take 1 tablet (40 mg total) by mouth daily. What changed:  when to take this reasons to take this   Magnesium Oxide 420 MG Tabs Take 420 mg by mouth daily.   memantine 10 MG tablet Commonly known as: NAMENDA Take 1 tablet (10 mg) twice a day What changed:  how much to take how to take this when to take this additional instructions   mirtazapine 15 MG tablet Commonly known as: REMERON TAKE 1 TABLET(15 MG) BY MOUTH AT BEDTIME What changed: See the new instructions.   simvastatin 40 MG tablet Commonly known as: ZOCOR TAKE 1 TABLET DAILY   Systane Complete PF 0.6 % Soln Generic drug: Propylene Glycol (PF) Place 1 drop into both eyes 4 (four) times daily.   triamcinolone cream 0.1 % Commonly known as: KENALOG Apply 1 application  topically daily as needed (for dryness/itching).   VITAMIN B12 PO Take 1 tablet by mouth daily.   Vitamin D3 50 MCG (2000 UT)  Tabs Take 2,000 Units by mouth daily.        Follow-up Information     Brian Rail, MD. Schedule an appointment as soon as possible for a visit in 1 week(s).   Specialty: Internal Medicine Contact information: [place] [address] [phone]                Allergies  Allergen Reactions   Meperidine Hcl Swelling and Other (See Comments)    Tongue swelling Because of a history of documented adverse serious drug reaction, Medi Alert bracelet  is recommended.   Sertraline Shortness Of Breath and Other (See Comments)    Burning sensation from feet to head, diff breathing   Gabapentin Other (See Comments)    Confusion    Memantine Other (See Comments)    Can take only up to 5 mg a day- causes lethargy past that amount   Zolpidem Tartrate Other (See Comments)    Nightmares    Consultations: GI   Procedures/Studies: No results  found.    Subjective: Patient seen and examined at bedside today.  Hemodynamically stable for discharge.  I called the daughter and discussed with the discharge plan.  Discharge Exam: Vitals:   04/05/22 1000 04/05/22 1024  BP: (!) 156/58 (!) 160/59  Pulse: 66 (!) 58  Resp: 16 18  Temp: 98 F (36.7 C) 98.1 F (36.7 C)  SpO2:     Vitals:   04/05/22 0534 04/05/22 0848 04/05/22 1000 04/05/22 1024  BP: (!) 113/42  (!) 156/58 (!) 160/59  Pulse: 98  66 (!) 58  Resp: 16  16 18   Temp: 98 F (36.7 C)  98 F (36.7 C) 98.1 F (36.7 C)  TempSrc:   Oral   SpO2: 100% 91%    Weight:      Height:        General: Pt is alert, awake, not in acute distress Cardiovascular: RRR, S1/S2 +, no rubs, no gallops Respiratory: CTA bilaterally, no wheezing, no rhonchi Abdominal: Soft, NT, ND, bowel sounds + Extremities: no edema, no cyanosis    The results of significant diagnostics from this hospitalization (including imaging, microbiology, ancillary and laboratory) are listed below for reference.     Microbiology: No  results found for this or any previous visit (from the past 240 hour(s)).   Labs: BNP (last 3 results) Recent Labs    09/12/21 1509  BNP 630.1*   Basic Metabolic Panel: Recent Labs  Lab 03/30/22 1120 04/02/22 1258 04/03/22 0616 04/03/22 1656 04/04/22 0631 04/05/22 0655  NA 142 140 139  --  139 139  K 5.9 No hemolysis seen* 5.4* 5.5* 5.3* 5.4* 4.9  CL 109 106 110  --  110 110  CO2 26 24 23   --  20* 22  GLUCOSE 92 141* 80  --  99 79  BUN 41* 44* 31*  --  27* 29*  CREATININE 1.77* 1.89* 1.64*  --  1.59* 1.62*  CALCIUM 9.4 8.8* 8.3*  --  8.9 8.6*   Liver Function Tests: Recent Labs  Lab 03/30/22 1120 04/02/22 1258  AST 12 15  ALT 9 11  ALKPHOS 58 50  BILITOT 0.4 0.5  PROT 7.2 7.4  ALBUMIN 4.2 4.3   Recent Labs  Lab 04/02/22 1258  LIPASE 51   No results for input(s): "AMMONIA" in the last 168 hours. CBC: Recent Labs  Lab 03/30/22 1120 04/02/22 1258 04/03/22 0616 04/03/22 1656 04/04/22 0631 04/05/22 0655  WBC 4.5 4.4 4.7  --  5.8 5.3  NEUTROABS 2.8 2.8  --   --   --   --   HGB 8.6 Repeated and verified X2.* 8.3* 7.2* 7.4* 7.6* 7.3*  HCT 26.8* 28.6* 25.2* 25.7* 26.4* 24.8*  MCV 81.7 88.0 90.3  --  89.5 89.5  PLT 154.0 160 142*  --  139* 155   Cardiac Enzymes: No results for input(s): "CKTOTAL", "CKMB", "CKMBINDEX", "TROPONINI" in the last 168 hours. BNP: Invalid input(s): "POCBNP" CBG: Recent Labs  Lab 04/04/22 1224  GLUCAP 84   D-Dimer No results for input(s): "DDIMER" in the last 72 hours. Hgb A1c No results for input(s): "HGBA1C" in the last 72 hours. Lipid Profile No results for input(s): "CHOL", "HDL", "LDLCALC", "TRIG", "CHOLHDL", "LDLDIRECT" in the last 72 hours. Thyroid function studies No results for input(s): "TSH", "T4TOTAL", "T3FREE", "THYROIDAB" in the last 72 hours.  Invalid input(s): "FREET3" Anemia work up Recent Labs    04/03/22 0615  TIBC 393  IRON 21*   Urinalysis    Component  Value Date/Time   COLORURINE STRAW (A)  04/02/2022 1729   APPEARANCEUR CLEAR 04/02/2022 1729   LABSPEC 1.015 04/02/2022 1729   PHURINE 7.0 04/02/2022 1729   GLUCOSEU NEGATIVE 04/02/2022 1729   HGBUR NEGATIVE 04/02/2022 1729   BILIRUBINUR NEGATIVE 04/02/2022 1729   KETONESUR NEGATIVE 04/02/2022 1729   PROTEINUR 30 (A) 04/02/2022 1729   UROBILINOGEN 1.0 03/02/2010 2320   NITRITE NEGATIVE 04/02/2022 1729   LEUKOCYTESUR NEGATIVE 04/02/2022 1729   Sepsis Labs Recent Labs  Lab 04/02/22 1258 04/03/22 0616 04/04/22 0631 04/05/22 0655  WBC 4.4 4.7 5.8 5.3   Microbiology No results found for this or any previous visit (from the past 240 hour(s)).  Please note: You were cared for by a hospitalist during your hospital stay. Once you are discharged, your primary care physician will handle any further medical issues. Please note that NO REFILLS for any discharge medications will be authorized once you are discharged, as it is imperative that you return to your primary care physician (or establish a relationship with a primary care physician if you do not have one) for your post hospital discharge needs so that they can reassess your need for medications and monitor your lab values.    Time coordinating discharge: 40 minutes  SIGNED:   Shelly Coss, MD  [facility] 04/05/2022, 11:12 AM Pager [phone]  If 7PM-7AM, please contact night-coverage [email or web address] Password [email or web address]

## 2022-04-06 ENCOUNTER — Telehealth: Payer: Self-pay | Admitting: *Deleted

## 2022-04-06 ENCOUNTER — Encounter: Payer: Self-pay | Admitting: *Deleted

## 2022-04-06 LAB — TYPE AND SCREEN
ABO/RH(D): B POS
Antibody Screen: NEGATIVE
Unit division: 0
Unit division: 0
Unit division: 0

## 2022-04-06 LAB — BPAM RBC
Blood Product Expiration Date: 202403062359
Blood Product Expiration Date: 202403062359
Blood Product Expiration Date: 202403072359
ISSUE DATE / TIME: 202402150959
Unit Type and Rh: 7300
Unit Type and Rh: 7300
Unit Type and Rh: 7300

## 2022-04-06 NOTE — Progress Notes (Signed)
  Care Coordination  Note  04/06/2022 Name: ELIGA ARVIE MRN: [id number] DOB: 03-Dec-1931  [name] is a 89 y.o. year old primary care patient of Burns, [name], MD.   Follow up plan: Hospital Follow Up appointment scheduled with (Dr Quay Burow) on (04/10/2022) at (140pm).  Julian Hy, [place] Direct Dial: [phone]

## 2022-04-06 NOTE — Transitions of Care (Post Inpatient/ED Visit) (Signed)
04/06/2022  Name: Brian [name] MRN: [id number] DOB: 1931/11/11  Today's TOC FU Call Status: Today's TOC FU Call Status:: Successful TOC FU Call Competed TOC FU Call Complete Date: 04/06/22  Transition Care Management Follow-up Telephone Call Date of Discharge: 04/05/22 Discharge Facility: [facility] Type of Discharge: Inpatient Admission Primary Inpatient Discharge Diagnosis:: Acute UGI bleed; anemia requiring PRBC infusion How have you been since you were released from the hospital?: Better Any questions or concerns?: Yes Patient Questions/Concerns:: per son [name], on [facility] DPR: "we don't have a doctor appointment yet; other than that-- he is doing great" Patient Questions/Concerns Addressed: Other: (see care coordination interventions below)  -- Care coordination in real time with scheduling care guide to facilitate scheduling of hospital follow up PCP appointment within 7 days   Items Reviewed: Did you receive and understand the discharge instructions provided?: Yes (reviewed with son/ caregiver today) Medications obtained and verified?: Yes (Medications Reviewed) (caregiver unable to complete faull medication review; confirms newly prescribed medications were obtained and patient is taking as prescribed; family manages medications; he denies queations/ concerns aorund medications today) Any new allergies since your discharge?: No Dietary orders reviewed?: No Do you have support at home?: Yes People in Home: child(ren), adult Name of Support/Comfort Primary Source: lives with adult daughter; patient is essentially independent in self-care activities; family provides assistance as indicated/ needed  Home Care and Equipment/Supplies: [place] Ordered?: No Any new equipment or medical supplies ordered?: No  Functional Questionnaire: Do you need assistance with bathing/showering or dressing?: Yes (daughter assists as needed) Do you need assistance with meal  preparation?: Yes (daughter assists as needed) Do you need assistance with eating?: No Do you have difficulty maintaining continence: No Do you need assistance with getting out of bed/getting out of a chair/moving?: No Do you have difficulty managing or taking your medications?: Yes (daughter manages medications)  Folllow up appointments reviewed: PCP Follow-up appointment confirmed?: Yes Date of PCP follow-up appointment?: 04/10/22 (Care coordination in real time with scheduling care guide to facilitate scheduling of hospital follow up PCP appointment within 7 days) Follow-up Provider: PCP, Dr. Heide Scales Follow-up appointment confirmed?: No Reason Specialist Follow-Up Not Confirmed: Patient has Specialist Provider Number and will Call for Appointment Do you need transportation to your follow-up appointment?: No Do you understand care options if your condition(s) worsen?: Yes-patient verbalized understanding  SDOH Interventions Today    Flowsheet Row Most Recent Value  SDOH Interventions   Food Insecurity Interventions Intervention Not Indicated  Transportation Interventions Intervention Not Indicated  [family provides transportation]      TOC Interventions Today    Flowsheet Row Most Recent Value  TOC Interventions   TOC Interventions Discussed/Reviewed Arranged PCP follow up within 7 days/Care Guide scheduled, TOC Interventions Discussed  [Care coordination in real time with scheduling care guide to facilitate scheduling of hospital follow up PCP appointment within 7 days]      Interventions Today    Flowsheet Row Most Recent Value  Chronic Disease   Chronic disease during today's visit Other  [GIU bleed/ anemia]  General Interventions   General Interventions Discussed/Reviewed General Interventions Discussed, Doctor Visits  Doctor Visits Discussed/Reviewed PCP, Doctor Visits Discussed, Specialist  [Care coordination in real time with scheduling care guide to  facilitate scheduling of hospital follow up PCP appointment within 7 days]  PCP/Specialist Visits Compliance with follow-up visit  Nutrition Interventions   Nutrition Discussed/Reviewed Nutrition Discussed  Pharmacy Interventions   Pharmacy Dicussed/Reviewed Pharmacy Topics Discussed  Oneta Rack, RN, BSN, CCRN Alumnus RN CM Care Coordination/ Transition of [place] Management [phone]: direct office

## 2022-04-07 ENCOUNTER — Encounter (HOSPITAL_COMMUNITY): Payer: Self-pay | Admitting: Internal Medicine

## 2022-04-09 ENCOUNTER — Encounter: Payer: Self-pay | Admitting: Internal Medicine

## 2022-04-09 NOTE — Progress Notes (Unsigned)
Subjective:    Patient ID: Brian [name], male    DOB: [birth year], 89 y.o.   MRN: [id number]     HPI Brian [name] is here for follow up from the hospital.  He is here with his daughter.  Admitted 2/12 - 2/15 for anemia  Found to have anemia on blood work after routine f/u here - later stated black tarry stools at home.  Hbg in ED 8, FOBT was positive.  K 5.4.  GI consulted. S/p a unit PRBC transfusion.   2/14 - colonoscopy - nonbleeding cecal AVM s/p APC.   Anemia worse next morning, received IV iron.  Advised iron supplementation.    Advised f/u with GI as an outpatient.  No appointment scheduled.  Overall he is feeling well-he has no concerns.  He is taking the iron on a daily basis and denies any constipation or black stools.  His appetite is good.  He denies any abdominal pain.   Medications and allergies reviewed with patient and updated if appropriate.  Current Outpatient Medications on File Prior to Visit  Medication Sig Dispense Refill   ACCU-CHEK COMPACT STRIPS test strip TEST BLOOD SUGAR ONCE DAILY 100 each 1   albuterol (VENTOLIN HFA) 108 (90 Base) MCG/ACT inhaler Inhale 2 puffs into the lungs every 6 (six) hours as needed for wheezing or shortness of breath. 18 g 0   ANORO ELLIPTA 62.5-25 MCG/ACT AEPB USE 1 INHALATION DAILY (Patient taking differently: Inhale 1 puff into the lungs daily.) 240 each 3   Aspirin 81 MG EC tablet Take 81 mg by mouth daily.     Cholecalciferol (VITAMIN D3) 50 MCG (2000 UT) TABS Take 2,000 Units by mouth daily.     Cyanocobalamin (VITAMIN B12 PO) Take 1 tablet by mouth daily.     enalapril (VASOTEC) 10 MG tablet TAKE 1 TABLET TWICE A DAY (Patient taking differently: Take 10 mg by mouth 2 (two) times daily.) 180 tablet 3   escitalopram (LEXAPRO) 5 MG tablet Take 1 tablet (5 mg total) by mouth every morning. 30 tablet 5   ferrous sulfate 325 (65 FE) MG tablet Take 1 tablet (325 mg total) by mouth daily with breakfast. 30 tablet 1   furosemide  (LASIX) 40 MG tablet Take 1 tablet (40 mg total) by mouth daily. (Patient taking differently: Take 40 mg by mouth daily as needed for fluid or edema.) 90 tablet 3   Magnesium Oxide 420 MG TABS Take 420 mg by mouth daily.      memantine (NAMENDA) 10 MG tablet Take 1 tablet (10 mg) twice a day (Patient taking differently: Take 5 mg by mouth in the morning.) 60 tablet 11   mirtazapine (REMERON) 15 MG tablet TAKE 1 TABLET(15 MG) BY MOUTH AT BEDTIME (Patient taking differently: Take 15 mg by mouth at bedtime.) 90 tablet 1   simvastatin (ZOCOR) 40 MG tablet TAKE 1 TABLET DAILY (Patient taking differently: Take 40 mg by mouth daily.) 90 tablet 3   SYSTANE COMPLETE PF 0.6 % SOLN Place 1 drop into both eyes 4 (four) times daily.     triamcinolone cream (KENALOG) 0.1 % Apply 1 application  topically daily as needed (for dryness/itching).     No current facility-administered medications on file prior to visit.     Review of Systems  Constitutional:  Negative for chills, fatigue and fever.       Appetite is good  Respiratory:  Positive for shortness of breath (intermittent). Negative for cough and  wheezing.   Cardiovascular:  Positive for leg swelling (controlled). Negative for chest pain and palpitations.  Gastrointestinal:  Negative for abdominal pain, blood in stool, constipation and diarrhea.  Neurological:  Negative for light-headedness and headaches.       Objective:   Vitals:   04/10/22 1327  BP: (!) 130/56  Pulse: 80  Temp: 98.3 F (36.8 C)  SpO2: 93%   BP Readings from Last 3 Encounters:  04/10/22 (!) 130/56  04/05/22 (!) 168/65  03/30/22 136/62   Wt Readings from Last 3 Encounters:  04/10/22 148 lb (67.1 kg)  04/02/22 147 lb 4.3 oz (66.8 kg)  03/30/22 146 lb (66.2 kg)   Body mass index is 20.64 kg/m.    Physical Exam Constitutional:      General: He is not in acute distress.    Appearance: Normal appearance. He is not ill-appearing.  HENT:     Head: Normocephalic and  atraumatic.  Eyes:     Conjunctiva/sclera: Conjunctivae normal.  Cardiovascular:     Rate and Rhythm: Normal rate and regular rhythm.     Heart sounds: Normal heart sounds.  Pulmonary:     Effort: Pulmonary effort is normal. No respiratory distress.     Breath sounds: Normal breath sounds. No wheezing or rales.  Musculoskeletal:     Right lower leg: No edema.     Left lower leg: No edema.  Skin:    General: Skin is warm and dry.     Findings: No rash.  Neurological:     Mental Status: He is alert. Mental status is at baseline.  Psychiatric:        Mood and Affect: Mood normal.        Lab Results  Component Value Date   WBC 5.3 04/05/2022   HGB 7.3 (L) 04/05/2022   HCT 24.8 (L) 04/05/2022   PLT 155 04/05/2022   GLUCOSE 79 04/05/2022   CHOL 123 03/30/2022   TRIG 98.0 03/30/2022   HDL 47.70 03/30/2022   LDLCALC 56 03/30/2022   ALT 11 04/02/2022   AST 15 04/02/2022   NA 139 04/05/2022   K 4.9 04/05/2022   CL 110 04/05/2022   CREATININE 1.62 (H) 04/05/2022   BUN 29 (H) 04/05/2022   CO2 22 04/05/2022   TSH 1.77 10/04/2021   PSA 1.42 07/16/2007   INR 1.2 01/21/2019   HGBA1C 6.5 03/30/2022   MICROALBUR 9.45 03/19/2018     Assessment & Plan:    See Problem List for Assessment and Plan of chronic medical problems.

## 2022-04-10 ENCOUNTER — Ambulatory Visit (INDEPENDENT_AMBULATORY_CARE_PROVIDER_SITE_OTHER): Payer: Medicare Other | Admitting: Internal Medicine

## 2022-04-10 VITALS — BP 130/56 | HR 80 | Temp 98.3°F | Ht 71.0 in | Wt 148.0 lb

## 2022-04-10 DIAGNOSIS — I10 Essential (primary) hypertension: Secondary | ICD-10-CM | POA: Diagnosis not present

## 2022-04-10 DIAGNOSIS — K922 Gastrointestinal hemorrhage, unspecified: Secondary | ICD-10-CM | POA: Diagnosis not present

## 2022-04-10 DIAGNOSIS — G479 Sleep disorder, unspecified: Secondary | ICD-10-CM | POA: Diagnosis not present

## 2022-04-10 DIAGNOSIS — I5032 Chronic diastolic (congestive) heart failure: Secondary | ICD-10-CM

## 2022-04-10 LAB — CBC WITH DIFFERENTIAL/PLATELET
Basophils Absolute: 0 10*3/uL (ref 0.0–0.1)
Basophils Relative: 0.9 % (ref 0.0–3.0)
Eosinophils Absolute: 0.2 10*3/uL (ref 0.0–0.7)
Eosinophils Relative: 3.5 % (ref 0.0–5.0)
HCT: 31.6 % — ABNORMAL LOW (ref 39.0–52.0)
Hemoglobin: 10.1 g/dL — ABNORMAL LOW (ref 13.0–17.0)
Lymphocytes Relative: 24.4 % (ref 12.0–46.0)
Lymphs Abs: 1.3 10*3/uL (ref 0.7–4.0)
MCHC: 32 g/dL (ref 30.0–36.0)
MCV: 84.2 fl (ref 78.0–100.0)
Monocytes Absolute: 0.4 10*3/uL (ref 0.1–1.0)
Monocytes Relative: 8.4 % (ref 3.0–12.0)
Neutro Abs: 3.3 10*3/uL (ref 1.4–7.7)
Neutrophils Relative %: 62.8 % (ref 43.0–77.0)
Platelets: 162 10*3/uL (ref 150.0–400.0)
RBC: 3.76 Mil/uL — ABNORMAL LOW (ref 4.22–5.81)
RDW: 20.4 % — ABNORMAL HIGH (ref 11.5–15.5)
WBC: 5.2 10*3/uL (ref 4.0–10.5)

## 2022-04-10 LAB — BASIC METABOLIC PANEL
BUN: 45 mg/dL — ABNORMAL HIGH (ref 6–23)
CO2: 28 mEq/L (ref 19–32)
Calcium: 9.5 mg/dL (ref 8.4–10.5)
Chloride: 107 mEq/L (ref 96–112)
Creatinine, Ser: 1.8 mg/dL — ABNORMAL HIGH (ref 0.40–1.50)
GFR: 33.27 mL/min — ABNORMAL LOW (ref >60.00)
Glucose, Bld: 95 mg/dL (ref 70–99)
Potassium: 5.5 mEq/L — ABNORMAL HIGH (ref 3.5–5.1)
Sodium: 141 mEq/L (ref 135–145)

## 2022-04-10 NOTE — Assessment & Plan Note (Signed)
Chronic Blood pressure currently controlled Continue enalapril 10 mg twice daily

## 2022-04-10 NOTE — Assessment & Plan Note (Signed)
Recent hospitalization for anemia related to acute upper GI bleeding EGD, colonoscopy showed cecal angiodysplasia that was treated with APC Received 1 unit packed red blood cells and iron infusion Currently taking iron orally which she is tolerating well No symptoms consistent with active bleeding Check CBC, BMP

## 2022-04-10 NOTE — Assessment & Plan Note (Signed)
Chronic Sleep overall controlled Continue mirtazapine 15 mg at bedtime

## 2022-04-10 NOTE — Assessment & Plan Note (Signed)
Chronic Appears euvolemic Continue Lasix 40 mg daily as needed for shortness of breath, increased edema Taking approximately once a week

## 2022-04-10 NOTE — Patient Instructions (Addendum)
      Blood work was ordered.   The lab is on the first floor.    Medications changes include :   none       Return in about 6 months (around 10/09/2022) for follow up.

## 2022-04-11 ENCOUNTER — Other Ambulatory Visit: Payer: Self-pay | Admitting: Internal Medicine

## 2022-04-11 MED ORDER — ENALAPRIL MALEATE 10 MG PO TABS: 10.0000 mg | ORAL_TABLET | Freq: Every day | ORAL | 3 refills | Status: DC

## 2022-04-13 ENCOUNTER — Other Ambulatory Visit: Payer: Self-pay

## 2022-04-13 ENCOUNTER — Other Ambulatory Visit: Payer: Self-pay | Admitting: Internal Medicine

## 2022-04-18 ENCOUNTER — Encounter: Payer: Self-pay | Admitting: Physician Assistant

## 2022-04-24 ENCOUNTER — Other Ambulatory Visit: Payer: Self-pay

## 2022-04-24 DIAGNOSIS — I714 Abdominal aortic aneurysm, without rupture, unspecified: Secondary | ICD-10-CM

## 2022-04-26 ENCOUNTER — Encounter: Payer: Self-pay | Admitting: Internal Medicine

## 2022-04-28 DIAGNOSIS — J439 Emphysema, unspecified: Secondary | ICD-10-CM | POA: Diagnosis not present

## 2022-05-07 MED ORDER — ESCITALOPRAM OXALATE 5 MG PO TABS: 10.0000 mg | ORAL_TABLET | Freq: Every morning | ORAL | 5 refills | Status: DC

## 2022-05-08 ENCOUNTER — Emergency Department (HOSPITAL_COMMUNITY): Payer: Medicare Other

## 2022-05-08 ENCOUNTER — Other Ambulatory Visit: Payer: Self-pay

## 2022-05-08 ENCOUNTER — Inpatient Hospital Stay (HOSPITAL_COMMUNITY)
Admission: EM | Admit: 2022-05-08 | Discharge: 2022-05-12 | DRG: 190 | Disposition: A | Discharge status: Home / Self Care | Payer: Medicare Other | Attending: Internal Medicine | Admitting: Internal Medicine

## 2022-05-08 DIAGNOSIS — Z951 Presence of aortocoronary bypass graft: Secondary | ICD-10-CM

## 2022-05-08 DIAGNOSIS — Z806 Family history of leukemia: Secondary | ICD-10-CM | POA: Diagnosis not present

## 2022-05-08 DIAGNOSIS — Z888 Allergy status to other drugs, medicaments and biological substances status: Secondary | ICD-10-CM

## 2022-05-08 DIAGNOSIS — Z83438 Family history of other disorder of lipoprotein metabolism and other lipidemia: Secondary | ICD-10-CM

## 2022-05-08 DIAGNOSIS — J439 Emphysema, unspecified: Secondary | ICD-10-CM | POA: Diagnosis not present

## 2022-05-08 DIAGNOSIS — G309 Alzheimer's disease, unspecified: Secondary | ICD-10-CM | POA: Diagnosis present

## 2022-05-08 DIAGNOSIS — F01A4 Vascular dementia, mild, with anxiety: Secondary | ICD-10-CM | POA: Diagnosis not present

## 2022-05-08 DIAGNOSIS — R195 Other fecal abnormalities: Secondary | ICD-10-CM | POA: Diagnosis present

## 2022-05-08 DIAGNOSIS — I252 Old myocardial infarction: Secondary | ICD-10-CM

## 2022-05-08 DIAGNOSIS — I509 Heart failure, unspecified: Secondary | ICD-10-CM

## 2022-05-08 DIAGNOSIS — I2510 Atherosclerotic heart disease of native coronary artery without angina pectoris: Secondary | ICD-10-CM | POA: Diagnosis present

## 2022-05-08 DIAGNOSIS — J449 Chronic obstructive pulmonary disease, unspecified: Secondary | ICD-10-CM | POA: Diagnosis present

## 2022-05-08 DIAGNOSIS — N1832 Chronic kidney disease, stage 3b: Secondary | ICD-10-CM | POA: Diagnosis not present

## 2022-05-08 DIAGNOSIS — J441 Chronic obstructive pulmonary disease with (acute) exacerbation: Secondary | ICD-10-CM | POA: Diagnosis not present

## 2022-05-08 DIAGNOSIS — D631 Anemia in chronic kidney disease: Secondary | ICD-10-CM | POA: Diagnosis present

## 2022-05-08 DIAGNOSIS — I130 Hypertensive heart and chronic kidney disease with heart failure and stage 1 through stage 4 chronic kidney disease, or unspecified chronic kidney disease: Secondary | ICD-10-CM | POA: Diagnosis present

## 2022-05-08 DIAGNOSIS — N184 Chronic kidney disease, stage 4 (severe): Secondary | ICD-10-CM | POA: Diagnosis present

## 2022-05-08 DIAGNOSIS — I471 Supraventricular tachycardia, unspecified: Secondary | ICD-10-CM | POA: Diagnosis not present

## 2022-05-08 DIAGNOSIS — M1990 Unspecified osteoarthritis, unspecified site: Secondary | ICD-10-CM | POA: Diagnosis present

## 2022-05-08 DIAGNOSIS — J90 Pleural effusion, not elsewhere classified: Secondary | ICD-10-CM | POA: Diagnosis not present

## 2022-05-08 DIAGNOSIS — Z7982 Long term (current) use of aspirin: Secondary | ICD-10-CM | POA: Diagnosis not present

## 2022-05-08 DIAGNOSIS — F0150 Vascular dementia without behavioral disturbance: Secondary | ICD-10-CM | POA: Diagnosis present

## 2022-05-08 DIAGNOSIS — I5032 Chronic diastolic (congestive) heart failure: Secondary | ICD-10-CM | POA: Diagnosis not present

## 2022-05-08 DIAGNOSIS — Z1152 Encounter for screening for COVID-19: Secondary | ICD-10-CM | POA: Diagnosis not present

## 2022-05-08 DIAGNOSIS — Z8249 Family history of ischemic heart disease and other diseases of the circulatory system: Secondary | ICD-10-CM | POA: Diagnosis not present

## 2022-05-08 DIAGNOSIS — J9621 Acute and chronic respiratory failure with hypoxia: Secondary | ICD-10-CM | POA: Diagnosis not present

## 2022-05-08 DIAGNOSIS — E1122 Type 2 diabetes mellitus with diabetic chronic kidney disease: Secondary | ICD-10-CM | POA: Diagnosis not present

## 2022-05-08 DIAGNOSIS — R0902 Hypoxemia: Secondary | ICD-10-CM | POA: Diagnosis not present

## 2022-05-08 DIAGNOSIS — Z87891 Personal history of nicotine dependence: Secondary | ICD-10-CM

## 2022-05-08 DIAGNOSIS — R0609 Other forms of dyspnea: Secondary | ICD-10-CM | POA: Diagnosis not present

## 2022-05-08 DIAGNOSIS — N179 Acute kidney failure, unspecified: Secondary | ICD-10-CM | POA: Diagnosis present

## 2022-05-08 DIAGNOSIS — E785 Hyperlipidemia, unspecified: Secondary | ICD-10-CM | POA: Diagnosis not present

## 2022-05-08 DIAGNOSIS — F02A4 Dementia in other diseases classified elsewhere, mild, with anxiety: Secondary | ICD-10-CM | POA: Diagnosis not present

## 2022-05-08 DIAGNOSIS — J9611 Chronic respiratory failure with hypoxia: Secondary | ICD-10-CM | POA: Diagnosis present

## 2022-05-08 DIAGNOSIS — Z9981 Dependence on supplemental oxygen: Secondary | ICD-10-CM

## 2022-05-08 DIAGNOSIS — E1159 Type 2 diabetes mellitus with other circulatory complications: Secondary | ICD-10-CM | POA: Diagnosis present

## 2022-05-08 DIAGNOSIS — Z7951 Long term (current) use of inhaled steroids: Secondary | ICD-10-CM

## 2022-05-08 DIAGNOSIS — R0602 Shortness of breath: Secondary | ICD-10-CM | POA: Diagnosis not present

## 2022-05-08 DIAGNOSIS — E119 Type 2 diabetes mellitus without complications: Secondary | ICD-10-CM

## 2022-05-08 DIAGNOSIS — Z833 Family history of diabetes mellitus: Secondary | ICD-10-CM

## 2022-05-08 DIAGNOSIS — Z923 Personal history of irradiation: Secondary | ICD-10-CM

## 2022-05-08 DIAGNOSIS — I714 Abdominal aortic aneurysm, without rupture, unspecified: Secondary | ICD-10-CM | POA: Diagnosis present

## 2022-05-08 DIAGNOSIS — Z8719 Personal history of other diseases of the digestive system: Secondary | ICD-10-CM

## 2022-05-08 DIAGNOSIS — I10 Essential (primary) hypertension: Secondary | ICD-10-CM | POA: Diagnosis present

## 2022-05-08 DIAGNOSIS — E1169 Type 2 diabetes mellitus with other specified complication: Secondary | ICD-10-CM | POA: Diagnosis present

## 2022-05-08 DIAGNOSIS — Z79899 Other long term (current) drug therapy: Secondary | ICD-10-CM

## 2022-05-08 LAB — CBC WITH DIFFERENTIAL/PLATELET
Abs Immature Granulocytes: 0.01 10*3/uL (ref 0.00–0.07)
Basophils Absolute: 0 10*3/uL (ref 0.0–0.1)
Basophils Relative: 1 %
Eosinophils Absolute: 0.3 10*3/uL (ref 0.0–0.5)
Eosinophils Relative: 5 %
HCT: 34.3 % — ABNORMAL LOW (ref 39.0–52.0)
Hemoglobin: 10.4 g/dL — ABNORMAL LOW (ref 13.0–17.0)
Immature Granulocytes: 0 %
Lymphocytes Relative: 16 %
Lymphs Abs: 0.9 10*3/uL (ref 0.7–4.0)
MCH: 28.2 pg (ref 26.0–34.0)
MCHC: 30.3 g/dL (ref 30.0–36.0)
MCV: 93 fL (ref 80.0–100.0)
Monocytes Absolute: 0.4 10*3/uL (ref 0.1–1.0)
Monocytes Relative: 7 %
Neutro Abs: 3.9 10*3/uL (ref 1.7–7.7)
Neutrophils Relative %: 71 %
Platelets: 172 10*3/uL (ref 150–400)
RBC: 3.69 MIL/uL — ABNORMAL LOW (ref 4.22–5.81)
RDW: 18.3 % — ABNORMAL HIGH (ref 11.5–15.5)
WBC: 5.4 10*3/uL (ref 4.0–10.5)
nRBC: 0 % (ref 0.0–0.2)

## 2022-05-08 LAB — RESP PANEL BY RT-PCR (RSV, FLU A&B, COVID)  RVPGX2
Influenza A by PCR: NEGATIVE
Influenza B by PCR: NEGATIVE
Resp Syncytial Virus by PCR: NEGATIVE
SARS Coronavirus 2 by RT PCR: NEGATIVE

## 2022-05-08 LAB — COMPREHENSIVE METABOLIC PANEL
ALT: 12 U/L (ref 0–44)
AST: 18 U/L (ref 15–41)
Albumin: 3.9 g/dL (ref 3.5–5.0)
Alkaline Phosphatase: 56 U/L (ref 38–126)
Anion gap: 10 (ref 5–15)
BUN: 32 mg/dL — ABNORMAL HIGH (ref 8–23)
CO2: 25 mmol/L (ref 22–32)
Calcium: 8.6 mg/dL — ABNORMAL LOW (ref 8.9–10.3)
Chloride: 105 mmol/L (ref 98–111)
Creatinine, Ser: 1.74 mg/dL — ABNORMAL HIGH (ref 0.61–1.24)
GFR, Estimated: 37 mL/min — ABNORMAL LOW (ref >60)
Glucose, Bld: 219 mg/dL — ABNORMAL HIGH (ref 70–99)
Potassium: 4.8 mmol/L (ref 3.5–5.1)
Sodium: 140 mmol/L (ref 135–145)
Total Bilirubin: 0.9 mg/dL (ref 0.3–1.2)
Total Protein: 7.5 g/dL (ref 6.5–8.1)

## 2022-05-08 LAB — TROPONIN I (HIGH SENSITIVITY)
Troponin I (High Sensitivity): 11 ng/L (ref <18)
Troponin I (High Sensitivity): 12 ng/L (ref <18)

## 2022-05-08 LAB — BRAIN NATRIURETIC PEPTIDE: B Natriuretic Peptide: 411.7 pg/mL — ABNORMAL HIGH (ref 0.0–100.0)

## 2022-05-08 MED ORDER — MEMANTINE HCL 5 MG PO TABS
5.0000 mg | ORAL_TABLET | Freq: Every day | ORAL | Status: DC
  Administered 2022-05-09 – 2022-05-12 (×4): 5 mg via ORAL
  Filled 2022-05-08 (×4): qty 1

## 2022-05-08 MED ORDER — ENALAPRIL MALEATE 10 MG PO TABS
10.0000 mg | ORAL_TABLET | Freq: Every day | ORAL | Status: DC
  Administered 2022-05-09 – 2022-05-12 (×4): 10 mg via ORAL
  Filled 2022-05-08 (×4): qty 1

## 2022-05-08 MED ORDER — ACETAMINOPHEN 650 MG RE SUPP: 650.0000 mg | Freq: Four times a day (QID) | RECTAL | Status: DC | PRN

## 2022-05-08 MED ORDER — MIRTAZAPINE 7.5 MG PO TABS
15.0000 mg | ORAL_TABLET | Freq: Every day | ORAL | Status: DC
  Administered 2022-05-08 – 2022-05-11 (×4): 15 mg via ORAL
  Filled 2022-05-08 (×5): qty 2

## 2022-05-08 MED ORDER — IOHEXOL 350 MG/ML SOLN
60.0000 mL | Freq: Once | INTRAVENOUS | Status: AC | PRN
  Administered 2022-05-08: 60 mL via INTRAVENOUS

## 2022-05-08 MED ORDER — ESCITALOPRAM OXALATE 10 MG PO TABS: 10.0000 mg | ORAL_TABLET | Freq: Every morning | ORAL | Status: DC

## 2022-05-08 MED ORDER — UMECLIDINIUM-VILANTEROL 62.5-25 MCG/ACT IN AEPB
1.0000 | INHALATION_SPRAY | Freq: Every day | RESPIRATORY_TRACT | Status: DC
  Administered 2022-05-09 – 2022-05-12 (×4): 1 via RESPIRATORY_TRACT
  Filled 2022-05-08: qty 14

## 2022-05-08 MED ORDER — SIMVASTATIN 20 MG PO TABS
40.0000 mg | ORAL_TABLET | Freq: Every day | ORAL | Status: DC
  Administered 2022-05-09 – 2022-05-12 (×4): 40 mg via ORAL
  Filled 2022-05-08 (×4): qty 2

## 2022-05-08 MED ORDER — ALBUTEROL SULFATE (2.5 MG/3ML) 0.083% IN NEBU: 2.5000 mg | INHALATION_SOLUTION | RESPIRATORY_TRACT | Status: DC | PRN

## 2022-05-08 MED ORDER — ACETAMINOPHEN 325 MG PO TABS
650.0000 mg | ORAL_TABLET | Freq: Four times a day (QID) | ORAL | Status: DC | PRN
  Administered 2022-05-08: 650 mg via ORAL
  Filled 2022-05-08: qty 2

## 2022-05-08 MED ORDER — MIDAZOLAM HCL 2 MG/2ML IJ SOLN
0.5000 mg | Freq: Once | INTRAMUSCULAR | Status: AC
  Administered 2022-05-08: 0.5 mg via INTRAVENOUS
  Filled 2022-05-08: qty 2

## 2022-05-08 MED ORDER — ENOXAPARIN SODIUM 30 MG/0.3ML IJ SOSY
30.0000 mg | PREFILLED_SYRINGE | INTRAMUSCULAR | Status: DC
  Administered 2022-05-08 – 2022-05-11 (×4): 30 mg via SUBCUTANEOUS
  Filled 2022-05-08 (×4): qty 0.3

## 2022-05-08 MED ORDER — ESCITALOPRAM OXALATE 10 MG PO TABS
5.0000 mg | ORAL_TABLET | Freq: Every day | ORAL | Status: DC
  Administered 2022-05-09 – 2022-05-12 (×4): 5 mg via ORAL
  Filled 2022-05-08 (×4): qty 1

## 2022-05-08 MED ORDER — LORAZEPAM 0.5 MG PO TABS
0.5000 mg | ORAL_TABLET | ORAL | Status: DC | PRN
  Administered 2022-05-08 – 2022-05-10 (×3): 0.5 mg via ORAL
  Filled 2022-05-08 (×3): qty 1

## 2022-05-08 MED ORDER — HYDRALAZINE HCL 20 MG/ML IJ SOLN: 10.0000 mg | Freq: Four times a day (QID) | INTRAMUSCULAR | Status: DC | PRN

## 2022-05-08 MED ORDER — HYDROCODONE-ACETAMINOPHEN 5-325 MG PO TABS
1.0000 | ORAL_TABLET | ORAL | Status: DC | PRN
  Administered 2022-05-09: 1 via ORAL
  Filled 2022-05-08: qty 1

## 2022-05-08 NOTE — ED Provider Notes (Signed)
[facility] Provider Note   CSN: [id number] Arrival date & time: 05/08/22  1305     History  Chief Complaint  Patient presents with   Shortness of Breath    Brian [name] is a 89 y.o. male.  Patient is an 89 year old male with a past medical history of COPD on 2 to 3 L nasal cannula at baseline, CHF, recent admission for anemia/GI bleed pretension, diabetes, CKD, mild dementia presenting to the emergency department with shortness of breath.  Patient is here with his daughter who helps take care of him and she states that over the last week he has had increasing dyspnea on exertion where he can only take about 3 steps before becoming short of breath.  The patient states that he also feels short of breath at rest and when lying flat.  She states that he initially attributed it to anxiety however when she visited him on Monday she checked his pulse ox and it was only 78%.  She states that she turned up his oxygen to 4 L but he was still having significant shortness of breath.  He denies any fevers or chills or cough.  He denies associated chest pain.  He denies any lower extremity swelling however his daughter states that his face appears more swollen than usual.  He states that he has still been having dark stools since he was in the hospital though he is now on iron.  The history is provided by the patient and a relative.  Shortness of Breath      Home Medications Prior to Admission medications   Medication Sig Start Date End Date Taking? Authorizing Provider  ACCU-CHEK COMPACT STRIPS test strip TEST BLOOD SUGAR ONCE DAILY 08/18/11   Hendricks Limes, MD  albuterol (VENTOLIN HFA) 108 (90 Base) MCG/ACT inhaler Inhale 2 puffs into the lungs every 6 (six) hours as needed for wheezing or shortness of breath. 01/15/22   Burns, [name], MD  ANORO ELLIPTA 62.5-25 MCG/ACT AEPB USE 1 INHALATION DAILY Patient taking differently: Inhale 1 puff into  the lungs daily. 04/18/21   Binnie Rail, MD  Aspirin 81 MG EC tablet Take 81 mg by mouth daily.    [provider]  Cholecalciferol (VITAMIN D3) 50 MCG (2000 UT) TABS Take 2,000 Units by mouth daily.    [provider]  Cyanocobalamin (VITAMIN B12 PO) Take 1 tablet by mouth daily.    [provider]  enalapril (VASOTEC) 10 MG tablet Take 1 tablet (10 mg total) by mouth daily. 04/11/22   Binnie Rail, MD  escitalopram (LEXAPRO) 5 MG tablet Take 2 tablets (10 mg total) by mouth every morning. 05/07/22   Binnie Rail, MD  ferrous sulfate 325 (65 FE) MG tablet Take 1 tablet (325 mg total) by mouth daily with breakfast. 04/06/22   Shelly Coss, MD  furosemide (LASIX) 40 MG tablet Take 1 tablet (40 mg total) by mouth daily. Patient taking differently: Take 40 mg by mouth daily as needed for fluid or edema. 12/27/21   Burnell Blanks, MD  Magnesium Oxide 420 MG TABS Take 420 mg by mouth daily.     [provider]  memantine (NAMENDA) 10 MG tablet Take 1 tablet (10 mg) twice a day Patient taking differently: Take 5 mg by mouth in the morning. 01/25/22   Rondel Jumbo, PA-C  mirtazapine (REMERON) 15 MG tablet TAKE 1 TABLET(15 MG) BY MOUTH AT BEDTIME Patient  taking differently: Take 15 mg by mouth at bedtime. 04/02/22   Binnie Rail, MD  simvastatin (ZOCOR) 40 MG tablet TAKE 1 TABLET DAILY 04/13/22   Binnie Rail, MD  SYSTANE COMPLETE PF 0.6 % SOLN Place 1 drop into both eyes 4 (four) times daily.    [provider]  triamcinolone cream (KENALOG) 0.1 % Apply 1 application  topically daily as needed (for dryness/itching).    [provider]      Allergies    Meperidine hcl, Sertraline, Gabapentin, Memantine, Meperidine hcl, and Zolpidem tartrate    Review of Systems   Review of Systems  Respiratory:  Positive for shortness of breath.     Physical Exam Updated Vital Signs BP (!) 147/74   Pulse 63   Temp 98.1 F (36.7 C) (Oral)    Resp (!) 25   Wt 67 kg   SpO2 98%   BMI 20.60 kg/m  Physical Exam Vitals and nursing note reviewed.  Constitutional:      General: He is not in acute distress.    Appearance: He is well-developed.  HENT:     Head: Normocephalic and atraumatic.     Mouth/Throat:     Mouth: Mucous membranes are moist.     Pharynx: Oropharynx is clear.  Eyes:     Extraocular Movements: Extraocular movements intact.  Neck:     Vascular: No JVD.  Cardiovascular:     Rate and Rhythm: Normal rate and regular rhythm.     Pulses: Normal pulses.     Heart sounds: Normal heart sounds.  Pulmonary:     Effort: Pulmonary effort is normal.     Breath sounds: Normal breath sounds.  Abdominal:     Palpations: Abdomen is soft.     Tenderness: There is no abdominal tenderness.  Musculoskeletal:        General: Normal range of motion.     Cervical back: Normal range of motion and neck supple.     Right lower leg: No edema.     Left lower leg: No edema.  Skin:    General: Skin is warm and dry.  Neurological:     General: No focal deficit present.     Mental Status: He is alert and oriented to person, place, and time.  Psychiatric:        Mood and Affect: Mood normal.        Behavior: Behavior normal.     ED Results / Procedures / Treatments   Labs (all labs ordered are listed, but only abnormal results are displayed) Labs Reviewed  CBC WITH DIFFERENTIAL/PLATELET - Abnormal; Notable for the following components:      Result Value   RBC 3.69 (*)    Hemoglobin 10.4 (*)    HCT 34.3 (*)    RDW 18.3 (*)    All other components within normal limits  COMPREHENSIVE METABOLIC PANEL - Abnormal; Notable for the following components:   Glucose, Bld 219 (*)    BUN 32 (*)    Creatinine, Ser 1.74 (*)    Calcium 8.6 (*)    GFR, Estimated 37 (*)    All other components within normal limits  BRAIN NATRIURETIC PEPTIDE - Abnormal; Notable for the following components:   B Natriuretic Peptide 411.7 (*)    All  other components within normal limits  TROPONIN I (HIGH SENSITIVITY)  TROPONIN I (HIGH SENSITIVITY)    EKG EKG Interpretation  Date/Time:  Tuesday May 08 2022 13:12:55 EDT Ventricular  Rate:  69 PR Interval:  241 QRS Duration: 138 QT Interval:  432 QTC Calculation: 463 R Axis:   270 Text Interpretation: Sinus rhythm Prolonged PR interval Left atrial enlargement RBBB and LAFB Minimal ST elevation, lateral leads No significant change since last tracing Confirmed by Leanord Asal ([id number]) on 05/08/2022 2:10:21 PM  Radiology DG Chest 2 View  Result Date: 05/08/2022 CLINICAL DATA:  Shortness of breath for 1 day. On home oxygen. History of COPD. EXAM: CHEST - 2 VIEW COMPARISON:  CT 01/10/2022.  Radiographs 09/27/2021 and 09/12/2021. FINDINGS: The heart size and mediastinal contours are stable status post median sternotomy and CABG. There is aortic atherosclerosis. The lungs are hyperinflated. Chronic right pleural effusion and linear scarring in the right lung are unchanged. There are mildly increased interstitial markings at the left lung base without consolidation or significant left pleural effusion. No pneumothorax. The bones appear unchanged. Abdominal aortic stent graft noted. IMPRESSION: Mildly increased interstitial markings at the left lung base which could reflect mild interstitial edema or atypical infection. Chronic right pleural effusion and scarring. Electronically Signed   By: Richardean Sale M.D.   On: 05/08/2022 14:09    Procedures Procedures    Medications Ordered in ED Medications  midazolam (VERSED) injection 0.5 mg (0.5 mg Intravenous Given 05/08/22 1557)    ED Course/ Medical Decision Making/ A&P Clinical Course as of 05/08/22 1612  Tue May 08, 2022  1611 Patient signed out to Dr. Francia Greaves pending CTPE study with plan for likely admission. [VK]    Clinical Course User Index [VK] Kemper Durie, DO                             Medical Decision Making This  patient presents to the ED with chief complaint(s) of shortness of breath with pertinent past medical history of CHF, COPD on 2 to 3 L home O2, CAD, CKD, recent admission for anemia/GI bleed which further complicates the presenting complaint. The complaint involves an extensive differential diagnosis and also carries with it a high risk of complications and morbidity.    The differential diagnosis includes CHF exacerbation, patient has no wheezing on exam making COPD exacerbation unlikely, ACS, arrhythmia, worsening anemia, pulmonary edema, pleural effusion, pneumonia, pneumothorax, considering PE due to patient's recent hospitalization  Additional history obtained: Additional history obtained from family Records reviewed previous admission documents  ED Course and Reassessment: On patient's arrival to the emergency department he is mildly tachypneic but in no acute respiratory distress satting well on 5 L nasal cannula.  He has no significant signs of volume overload on exam however having workup including EKG, labs, chest x-ray, BNP and troponin.  If patient's workup shows no obvious cause of his shortness of breath, likely require CT PE study due to rule out PE.  Independent labs interpretation:  The following labs were independently interpreted: mild BNP elevation, Hgb stable from outpatient labs  Independent visualization of imaging: - I independently visualized the following imaging with scope of interpretation limited to determining acute life threatening conditions related to emergency care: CXR, which revealed mild vascular congestions      Amount and/or Complexity of Data Reviewed Labs: ordered. Radiology: ordered.  Risk Prescription drug management.          Final Clinical Impression(s) / ED Diagnoses Final diagnoses:  Dyspnea on exertion  Hypoxia    Rx / DC Orders ED Discharge Orders     None  Leanord Asal K, DO 05/08/22 [phone]

## 2022-05-08 NOTE — ED Notes (Signed)
Admitting MD at [facility]. EKG repeated for apparent rhythm change, monitor reading afib.

## 2022-05-08 NOTE — ED Notes (Addendum)
Pt alert, NAD, calm, interactive, resps e/u, speaking in clear complete sentences, VSS, family at [facility], IV established, blood sent, pt to xray via stretcher, remains on O2 [place] 5LPM.

## 2022-05-08 NOTE — H&P (Signed)
History and Physical  Brian [name] [id number] DOB: Jun 27, 1931 DOA: 05/08/2022  PCP: Binnie Rail, MD   Chief Complaint: Shortness of breath  HPI: Brian [name] is a 89 y.o. male with a complex medical history significant for coronary artery disease, status post CABG, COPD on 2 to 3 L nasal cannula oxygen chronically, diet-controlled type 2 diabetes.  Hypertension, hyperlipidemia, chronic pleural effusion who had a recent stay in this facility for anemia/GI bleed who now presents to the emergency department with complaints of shortness of breath.  Patient is here with his daughter who does not live with him, but watches him closely and is a very good historian, she states that over the last week or so, he has had increasing dyspnea on exertion especially, when he can only take about 2 or 3 steps before getting short of breath.  Patient apparently also says that he feels short of breath when he is lying in bed or sitting in a chair.  During the week, he has stayed hypoxic in general, at 1 point, his pulse ox is only 70% on his baseline 3 L oxygen.  Turned his oxygen up to 4 and pulse ox came up but he was still feeling short of breath.  There has not been any fevers, chills, or cough.  No associated chest pain.  Denies lower extremity swelling.  No bright red blood per rectum.  He came to the emergency department for evaluation of this dyspnea, acute on chronic hypoxic respiratory failure and shortness of breath.  ED Course: In the emergency department, initially was saturating about 80% on his baseline 3 L, now saturating about 100% on 4 L, blood pressure was elevated to as high as 172/71.  Lab work was done, shows normal white blood cell count, hemoglobin 10.4 which is stable from discharge, and renal function is also stable at his baseline.  Review of Systems: Please see HPI for pertinent positives and negatives. A complete 10 system review of systems are otherwise negative.  Past Medical  History:  Diagnosis Date   AAA (abdominal aortic aneurysm) ([place])    Arthritis    Asthma 1976   Bronchiolitis 06/2014   CAD (coronary artery disease)    NSTEMI 02/2010;  s/p CABG 1/12 (L-LAD, SOM1, S-PDA); echo in 08/2010: Mild LVH, EF [id number], grade 2 diastolic dysfunction, mild MR, mild LAE, mildly reduced RV function, mild RAE, PASP 42   Carpal tunnel syndrome of left wrist    Cecal angiodysplasia 04/04/2022   Colon polyps    COPD (chronic obstructive pulmonary disease) (HCC)    Diabetes mellitus 2000   Type II, controlls with diet   Dyspnea    ED (erectile dysfunction)    Gout    History of radiation therapy 03/09/2020-03/16/2020   SBRT right lung: Dr. Gery Pray   HTN (hypertension)    Hyperlipidemia    Inguinal hernia    Right   Macular degeneration    Myocardial infarction [facility]) 1 -13-2012   Neuropathy    Past Surgical History:  Procedure Laterality Date   3 vessel CABG  03/03/2010   ABDOMINAL AORTIC ENDOVASCULAR STENT GRAFT N/A 01/21/2019   Procedure: ABDOMINAL AORTIC ENDOVASCULAR STENT GRAFT;  Surgeon: Waynetta Sandy, MD;  Location: [facility];  Service: Vascular;  Laterality: N/A;   cataract left eye     COLONOSCOPY     neg 2009   COLONOSCOPY  2014   5 mm sessile polyp; AVM   COLONOSCOPY WITH PROPOFOL N/A 04/04/2022  Procedure: COLONOSCOPY WITH PROPOFOL;  Surgeon: Gatha Mayer, MD;  Location: [facility];  Service: Gastroenterology;  Laterality: N/A;   CORONARY ARTERY BYPASS GRAFT  2012   X3   ENDOVASCULAR REPAIR/STENT GRAFT  01/21/2019   ABDOMINAL AORTIC ENDOVASCULAR STENT GRAFT (N/A )   ESOPHAGOGASTRODUODENOSCOPY (EGD) WITH PROPOFOL N/A 04/04/2022   Procedure: ESOPHAGOGASTRODUODENOSCOPY (EGD) WITH PROPOFOL;  Surgeon: Gatha Mayer, MD;  Location: [facility];  Service: Gastroenterology;  Laterality: N/A;   EYE SURGERY     Left cataract   HERNIA REPAIR     HOT HEMOSTASIS N/A 04/04/2022   Procedure: HOT HEMOSTASIS (ARGON PLASMA COAGULATION/BICAP);   Surgeon: Gatha Mayer, MD;  Location: [facility];  Service: Gastroenterology;  Laterality: N/A;   INCISION AND DRAINAGE PERIRECTAL ABSCESS     INGUINAL HERNIA REPAIR      Social History:  reports that he quit smoking about 19 years ago. His smoking use included cigarettes. He has a 40.00 pack-year smoking history. He has never used smokeless tobacco. He reports that he does not currently use alcohol after a past usage of about 6.0 standard drinks of alcohol per week. He reports that he does not use drugs.   Allergies  Allergen Reactions   Meperidine Hcl Swelling and Other (See Comments)    Tongue swelling Because of a history of documented adverse serious drug reaction, Medi Alert bracelet  is recommended.   Sertraline Shortness Of Breath and Other (See Comments)    Burning sensation from feet to head, diff breathing   Gabapentin Other (See Comments)    Confusion    Memantine Other (See Comments)    Can take only up to 5 mg a day- causes lethargy past that amount   Meperidine Hcl Other (See Comments)    Tongue swelling, Because of a history of documented adverse serious drug reaction;Medi Alert bracelet  is recommended   Zolpidem Tartrate Other (See Comments)    Nightmares    Family History  Problem Relation Age of Onset   Heart failure Mother    Heart disease Mother        CHF   Heart attack Mother    Hypertension Mother    Heart attack Father    Heart disease Father 3       MI   Hypertension Father    Heart attack Sister 61   Heart disease Sister 66       MI   Diabetes Sister    Hyperlipidemia Sister    Hypertension Sister    Varicose Veins Sister    Heart attack Brother 23   Diabetes Brother    Heart disease Brother 73       MI   Cancer Brother    Hypertension Brother    Deep vein thrombosis Daughter    Leukemia Daughter    Other Son        varicose veins   Arthritis/Rheumatoid Son    Lupus Daughter    Colon cancer Neg Hx      Prior to Admission  medications   Medication Sig Start Date End Date Taking? Authorizing Provider  ACCU-CHEK COMPACT STRIPS test strip TEST BLOOD SUGAR ONCE DAILY 08/18/11   Hendricks Limes, MD  albuterol (VENTOLIN HFA) 108 (90 Base) MCG/ACT inhaler Inhale 2 puffs into the lungs every 6 (six) hours as needed for wheezing or shortness of breath. 01/15/22   Binnie Rail, MD  ANORO ELLIPTA 62.5-25 MCG/ACT AEPB USE 1 INHALATION DAILY Patient taking differently: Inhale  1 puff into the lungs daily. 04/18/21   Binnie Rail, MD  Aspirin 81 MG EC tablet Take 81 mg by mouth daily.    [provider]  Cholecalciferol (VITAMIN D3) 50 MCG (2000 UT) TABS Take 2,000 Units by mouth daily.    [provider]  Cyanocobalamin (VITAMIN B12 PO) Take 1 tablet by mouth daily.    [provider]  enalapril (VASOTEC) 10 MG tablet Take 1 tablet (10 mg total) by mouth daily. 04/11/22   Binnie Rail, MD  escitalopram (LEXAPRO) 5 MG tablet Take 2 tablets (10 mg total) by mouth every morning. 05/07/22   Binnie Rail, MD  ferrous sulfate 325 (65 FE) MG tablet Take 1 tablet (325 mg total) by mouth daily with breakfast. 04/06/22   Shelly Coss, MD  furosemide (LASIX) 40 MG tablet Take 1 tablet (40 mg total) by mouth daily. Patient taking differently: Take 40 mg by mouth daily as needed for fluid or edema. 12/27/21   Burnell Blanks, MD  Magnesium Oxide 420 MG TABS Take 420 mg by mouth daily.     [provider]  memantine (NAMENDA) 10 MG tablet Take 1 tablet (10 mg) twice a day Patient taking differently: Take 5 mg by mouth in the morning. 01/25/22   Rondel Jumbo, PA-C  mirtazapine (REMERON) 15 MG tablet TAKE 1 TABLET(15 MG) BY MOUTH AT BEDTIME Patient taking differently: Take 15 mg by mouth at bedtime. 04/02/22   Binnie Rail, MD  simvastatin (ZOCOR) 40 MG tablet TAKE 1 TABLET DAILY 04/13/22   Binnie Rail, MD  SYSTANE COMPLETE PF 0.6 % SOLN Place 1 drop into both eyes 4 (four) times daily.     [provider]  triamcinolone cream (KENALOG) 0.1 % Apply 1 application  topically daily as needed (for dryness/itching).    [provider]    Physical Exam: BP (!) 172/71   Pulse 70   Temp 98.1 F (36.7 C) (Oral)   Resp (!) 25   Wt 67 kg   SpO2 100%   BMI 20.60 kg/m   General:  Alert, oriented, calm, in no acute distress, breathing calmly and comfortably on 4 L nasal cannula oxygen.  Overall he looks euvolemic. Eyes: EOMI, clear conjuctivae, white sclerea Neck: supple, no masses, trachea mildline  Cardiovascular: RRR, no murmurs or rubs, no peripheral edema  Respiratory: Breath sounds are bit distant, but he sounds very clear to auscultation, without wheezing or rhonchi.  He is not tachypneic, there is no respiratory distress.   Abdomen: soft, nontender, nondistended, normal bowel tones heard  Skin: dry, no rashes  Musculoskeletal: no joint effusions, normal range of motion  Psychiatric: appropriate affect, normal speech  Neurologic: extraocular muscles intact, clear speech, moving all extremities with intact sensorium          Labs on Admission:  Basic Metabolic Panel: Recent Labs  Lab 05/08/22 1335  NA 140  K 4.8  CL 105  CO2 25  GLUCOSE 219*  BUN 32*  CREATININE 1.74*  CALCIUM 8.6*   Liver Function Tests: Recent Labs  Lab 05/08/22 1335  AST 18  ALT 12  ALKPHOS 56  BILITOT 0.9  PROT 7.5  ALBUMIN 3.9   No results for input(s): "LIPASE", "AMYLASE" in the last 168 hours. No results for input(s): "AMMONIA" in the last 168 hours. CBC: Recent Labs  Lab 05/08/22 1335  WBC 5.4  NEUTROABS 3.9  HGB 10.4*  HCT 34.3*  MCV 93.0  PLT 172   Cardiac Enzymes: No results for input(s): "CKTOTAL", "CKMB", "CKMBINDEX", "TROPONINI" in the last 168 hours.  BNP (last 3 results) Recent Labs    09/12/21 1509 05/08/22 1335  BNP 690.9* 411.7*    ProBNP (last 3 results) No results for input(s): "PROBNP" in the last 8760 hours.  CBG: No results  for input(s): "GLUCAP" in the last 168 hours.  Radiological Exams on Admission: CT Angio Chest PE W/Cm &/Or Wo Cm  Result Date: 05/08/2022 CLINICAL DATA:  Shortness of breath. EXAM: CT ANGIOGRAPHY CHEST WITH CONTRAST TECHNIQUE: Multidetector CT imaging of the chest was performed using the standard protocol during bolus administration of intravenous contrast. Multiplanar CT image reconstructions and MIPs were obtained to evaluate the vascular anatomy. RADIATION DOSE REDUCTION: This exam was performed according to the departmental dose-optimization program which includes automated exposure control, adjustment of the mA and/or kV according to patient size and/or use of iterative reconstruction technique. CONTRAST:  60mL OMNIPAQUE IOHEXOL 350 MG/ML SOLN COMPARISON:  CT scan 09/12/2021 FINDINGS: Cardiovascular: The heart is normal in size. No pericardial effusion. Stable tortuosity and calcification of the thoracic aorta but no focal aneurysm or dissection. Three-vessel coronary artery calcifications and surgical changes from coronary artery bypass surgery. Enlarged pulmonary artery suggesting pulmonary hypertension. No filling defects to suggest pulmonary embolism. Mediastinum/Nodes: Scattered borderline mediastinal and hilar lymph nodes appears stable and are likely reactive. No mass or overt adenopathy. The esophagus is grossly. Lungs/Pleura: Chronic small to moderate-sized right pleural effusion with overlying atelectasis. Stable significant underlying emphysematous changes and pulmonary scarring. Dense band of right upper lobe radiation fibrosis with a medial nodular component measuring 10 mm which appears stable. Findings consistent treated neoplasm. No findings suspicious for recurrent tumor. Stable significant underlying emphysematous changes. Small left upper lobe pulmonary nodule measures 6 mm and is unchanged since 2021 and considered benign. Upper Abdomen: No significant upper abdominal findings. Stable  aortoiliac stent graft. Musculoskeletal: The bony thorax is intact. Review of the MIP images confirms the above findings. IMPRESSION: 1. No CT findings for pulmonary embolism. 2. Enlarged pulmonary arteries suggesting pulmonary hypertension. 3. Chronic small to moderate-sized right pleural effusion with overlying atelectasis. 4. Stable right upper lobe radiation fibrosis with a medial nodular component measuring 10 mm. No findings suspicious for recurrent tumor. 5. Stable 6 mm left upper lobe pulmonary nodule since 2021 and considered benign. 6. Stable borderline mediastinal and hilar lymph nodes, likely reactive. 7. Stable aortoiliac stent graft. Aortic Atherosclerosis (ICD10-I70.0) and Emphysema (ICD10-J43.9). Electronically Signed   By: Marijo Sanes M.D.   On: 05/08/2022 16:36   DG Chest 2 View  Result Date: 05/08/2022 CLINICAL DATA:  Shortness of breath for 1 day. On home oxygen. History of COPD. EXAM: CHEST - 2 VIEW COMPARISON:  CT 01/10/2022.  Radiographs 09/27/2021 and 09/12/2021. FINDINGS: The heart size and mediastinal contours are stable status post median sternotomy and CABG. There is aortic atherosclerosis. The lungs are hyperinflated. Chronic right pleural effusion and linear scarring in the right lung are unchanged. There are mildly increased interstitial markings at the left lung base without consolidation or significant left pleural effusion. No pneumothorax. The bones appear unchanged. Abdominal aortic stent graft noted. IMPRESSION: Mildly increased interstitial markings at the left lung base which could reflect mild interstitial edema or atypical infection. Chronic right pleural effusion and scarring. Electronically Signed   By: Richardean Sale M.D.   On: 05/08/2022 14:09    Assessment/Plan Principal Problem:   Acute on chronic respiratory failure with hypoxia (HCC) -  this is a gentleman with significant chronic pulmonary disease, including pleural effusion, COPD, as well as chronic  diastolic heart failure which affects his lungs as well.  Currently, there is no evidence of COPD exacerbation, or acute heart failure.  In fact he seems quite euvolemic, and labs corroborate this.  He has a pleural effusion that is not significantly changed.  I suspect his presentation is related either to anxiety, which is certainly worsening his sensation of dyspnea, or simply to gradual progressive worsening of his disease, which is the usual course.   -Observation admission -Cardiac diet -Supplemental oxygen, start to wean as tolerated to keep O2 saturation above 90% -Currently no indication for antibiotics, steroids; will offer albuterol nebs as needed Active Problems:   Chronic diastolic heart failure (HCC)   COPD (chronic obstructive pulmonary disease) ([place])   Stage 3b chronic kidney disease (CKD) ([place])   Hypertension   Hyperlipidemia   CAD (coronary artery disease)   Diabetes mellitus type 2 without retinopathy ([place])   Mixed Alzheimer's and vascular dementia ([place])   Pleural effusion due to CHF (congestive heart failure) (HCC)   SVT (supraventricular tachycardia)  Medications for his chronic ailments listed above will be continued as appropriate, will add some as needed Ativan as I think anxiety is certainly playing a role in his cycle of dyspnea.  DVT prophylaxis: Lovenox   Code Status:    Code Status: Full Code confirmed with patient and his daughter this evening in the emergency department.  Consults called: None  Admission status: Observation   Time spent: 35 minutes  Rhia Blatchford Neva Seat MD [facility] Pager [phone]  If 7PM-7AM, please contact night-coverage [email or web address] Password [facility]  05/08/2022, 5:47 PM

## 2022-05-08 NOTE — ED Triage Notes (Signed)
C/o sob x1 day.  Denies cp.  Pt is on 3lpm [place] at home for CHF, COPD.  Family increased to 4lpm [place] w/o relief,  Patient at 80% on 4lpm [place] on arrival.  Recently discharged for GI bleed with transfusion. patient states stool are darker but no blood noted. Denies blood thinner usage. Denies weight gain.

## 2022-05-08 NOTE — ED Provider Notes (Signed)
Patient seen after prior EDP.  Patient and patient's family updated as to progress of ED workup.  Patient is agreeable with plan for admission.  Hospitalist service is aware of case and will evaluate for admission.   Valarie Merino, MD 05/08/22 [phone]

## 2022-05-09 ENCOUNTER — Encounter (HOSPITAL_COMMUNITY): Payer: Self-pay | Admitting: Internal Medicine

## 2022-05-09 DIAGNOSIS — J9621 Acute and chronic respiratory failure with hypoxia: Secondary | ICD-10-CM | POA: Diagnosis not present

## 2022-05-09 LAB — BASIC METABOLIC PANEL
Anion gap: 8 (ref 5–15)
BUN: 33 mg/dL — ABNORMAL HIGH (ref 8–23)
CO2: 28 mmol/L (ref 22–32)
Calcium: 8.4 mg/dL — ABNORMAL LOW (ref 8.9–10.3)
Chloride: 104 mmol/L (ref 98–111)
Creatinine, Ser: 1.7 mg/dL — ABNORMAL HIGH (ref 0.61–1.24)
GFR, Estimated: 38 mL/min — ABNORMAL LOW (ref >60)
Glucose, Bld: 117 mg/dL — ABNORMAL HIGH (ref 70–99)
Potassium: 4.6 mmol/L (ref 3.5–5.1)
Sodium: 140 mmol/L (ref 135–145)

## 2022-05-09 LAB — CBC
HCT: 28.7 % — ABNORMAL LOW (ref 39.0–52.0)
Hemoglobin: 8.6 g/dL — ABNORMAL LOW (ref 13.0–17.0)
MCH: 28.1 pg (ref 26.0–34.0)
MCHC: 30 g/dL (ref 30.0–36.0)
MCV: 93.8 fL (ref 80.0–100.0)
Platelets: 144 10*3/uL — ABNORMAL LOW (ref 150–400)
RBC: 3.06 MIL/uL — ABNORMAL LOW (ref 4.22–5.81)
RDW: 18.1 % — ABNORMAL HIGH (ref 11.5–15.5)
WBC: 4.9 10*3/uL (ref 4.0–10.5)
nRBC: 0 % (ref 0.0–0.2)

## 2022-05-09 MED ORDER — ALBUTEROL SULFATE HFA 108 (90 BASE) MCG/ACT IN AERS
2.0000 | INHALATION_SPRAY | RESPIRATORY_TRACT | Status: DC | PRN
  Administered 2022-05-09 – 2022-05-12 (×2): 2 via RESPIRATORY_TRACT
  Filled 2022-05-09: qty 6.7

## 2022-05-09 MED ORDER — ALBUTEROL SULFATE (2.5 MG/3ML) 0.083% IN NEBU
2.5000 mg | INHALATION_SOLUTION | Freq: Four times a day (QID) | RESPIRATORY_TRACT | Status: DC | PRN
  Administered 2022-05-09: 2.5 mg via RESPIRATORY_TRACT
  Filled 2022-05-09: qty 3

## 2022-05-09 MED ORDER — PREDNISONE 20 MG PO TABS
20.0000 mg | ORAL_TABLET | Freq: Two times a day (BID) | ORAL | Status: DC
  Administered 2022-05-09 – 2022-05-12 (×7): 20 mg via ORAL
  Filled 2022-05-09 (×7): qty 1

## 2022-05-09 NOTE — Progress Notes (Signed)
PROGRESS NOTE    Brian [name]  [id number] DOB: 22-Nov-1931 DOA: 05/08/2022 PCP: Binnie Rail, MD   Brief Narrative:  Brian [name] is a 89 y.o. male with a complex medical history significant for coronary artery disease, status post CABG, COPD on 2 to 3 L nasal cannula oxygen chronically, diet-controlled type 2 diabetes.  Hypertension, hyperlipidemia, chronic pleural effusion who had a recent stay in this facility for anemia/GI bleed who now presents to the emergency department with complaints of shortness of breath. Worsening fatigue, dyspnea on exertion, limited to 3-4 steps before needing to rest. Found to be profoundly hypoxic at home (70%).  Assessment & Plan:   Principal Problem:   Acute on chronic respiratory failure with hypoxia (HCC) Active Problems:   Chronic diastolic heart failure (HCC)   COPD (chronic obstructive pulmonary disease) (HCC)   Stage 3b chronic kidney disease (CKD) (HCC)   Hypertension   Hyperlipidemia   CAD (coronary artery disease)   Diabetes mellitus type 2 without retinopathy (HCC)   Mixed Alzheimer's and vascular dementia (HCC)   Pleural effusion due to CHF (congestive heart failure) (HCC)   SVT (supraventricular tachycardia)  Acute on chronic respiratory failure with hypoxia Presumed COPD flare - Continue to wean oxygen as tolerated - Unclear provoking event, presumed COPD exacerbation as above given negative PE chest x-ray and CT scans. -Start low-dose steroids, titrate down as appropriate - Continue incentive spirometry, no antibiotics indicated at this time -Continue Anoro, as needed nebs/bronchodilators -Anxiety likely playing a role in patient's respiratory status as well, continue low-dose Ativan, use sparingly given patient's age  Chronic diastolic heart failure euvolemic, continue enalapril CKD 3B -monitor closely, at baseline Hypertension -well-controlled on current regimen Hyperlipidemia -continue statin Diabetes type 2,  controlled -diet controlled; monitor closely while on steroids Mixed Alzheimer's and vascular dementia-continue Lexapro, Namenda  DVT prophylaxis: Lovenox Code Status: Full Family Communication: Daughter updated over the phone  Status is: Inpatient  Dispo: The patient is from: Home              Anticipated d/c is to: To be determined              Anticipated d/c date is: 48 to 72 hours              Patient currently not medically stable for discharge  Consultants:  None  Procedures:  None  Antimicrobials:  None indicated  Subjective: No acute issues or events overnight, respiratory status appears to be improving, denies nausea vomiting diarrhea constipation headache fevers chills or chest pain  Objective: Vitals:   05/09/22 0156 05/09/22 0230 05/09/22 0534 05/09/22 0540  BP:  (!) 158/64 135/61   Pulse:  61 (!) 57   Resp:  20 15 18   Temp:  97.8 F (36.6 C) (!) 97.5 F (36.4 C)   TempSrc:  Oral Oral   SpO2: 91% 96% 98%   Weight:      Height:        Intake/Output Summary (Last 24 hours) at 05/09/2022 0750 Last data filed at 05/09/2022 0500 Gross per 24 hour  Intake 60 ml  Output 200 ml  Net -140 ml   Filed Weights   05/08/22 1322 05/08/22 1926  Weight: 67 kg 65 kg    Examination:  General: Pleasantly resting in bed, No acute distress. HEENT: Normocephalic atraumatic.  Sclerae nonicteric, noninjected.  Extraocular movements intact bilaterally. Neck: Without mass or deformity.  Trachea is midline. Lungs: Clear to auscultate bilaterally without rhonchi,  wheeze, or rales. Heart: Regular rate and rhythm.  Without murmurs, rubs, or gallops. Abdomen: Soft, nontender, nondistended.  Without guarding or rebound. Extremities: Without cyanosis, clubbing, edema, or obvious deformity. Skin:  Warm and dry, no erythema  Data Reviewed: I have personally reviewed following labs and imaging studies  CBC: Recent Labs  Lab 05/08/22 1335 05/09/22 0526  WBC 5.4 4.9   NEUTROABS 3.9  --   HGB 10.4* 8.6*  HCT 34.3* 28.7*  MCV 93.0 93.8  PLT 172 [id number]*   Basic Metabolic Panel: Recent Labs  Lab 05/08/22 1335 05/09/22 0526  NA 140 140  K 4.8 4.6  CL 105 104  CO2 25 28  GLUCOSE 219* 117*  BUN 32* 33*  CREATININE 1.74* 1.70*  CALCIUM 8.6* 8.4*   GFR: Estimated Creatinine Clearance: 27.6 mL/min (A) (by C-G formula based on SCr of 1.7 mg/dL (H)). Liver Function Tests: Recent Labs  Lab 05/08/22 1335  AST 18  ALT 12  ALKPHOS 56  BILITOT 0.9  PROT 7.5  ALBUMIN 3.9   No results for input(s): "LIPASE", "AMYLASE" in the last 168 hours. No results for input(s): "AMMONIA" in the last 168 hours. Coagulation Profile: No results for input(s): "INR", "PROTIME" in the last 168 hours. Cardiac Enzymes: No results for input(s): "CKTOTAL", "CKMB", "CKMBINDEX", "TROPONINI" in the last 168 hours. BNP (last 3 results) No results for input(s): "PROBNP" in the last 8760 hours. HbA1C: No results for input(s): "HGBA1C" in the last 72 hours. CBG: No results for input(s): "GLUCAP" in the last 168 hours. Lipid Profile: No results for input(s): "CHOL", "HDL", "LDLCALC", "TRIG", "CHOLHDL", "LDLDIRECT" in the last 72 hours. Thyroid Function Tests: No results for input(s): "TSH", "T4TOTAL", "FREET4", "T3FREE", "THYROIDAB" in the last 72 hours. Anemia Panel: No results for input(s): "VITAMINB12", "FOLATE", "FERRITIN", "TIBC", "IRON", "RETICCTPCT" in the last 72 hours. Sepsis Labs: No results for input(s): "PROCALCITON", "LATICACIDVEN" in the last 168 hours.  Recent Results (from the past 240 hour(s))  Resp panel by RT-PCR (RSV, Flu A&B, Covid) Anterior Nasal Swab     Status: None   Collection Time: 05/08/22  4:55 PM   Specimen: Anterior Nasal Swab  Result Value Ref Range Status   SARS Coronavirus 2 by RT PCR NEGATIVE NEGATIVE Final    Comment: (NOTE) SARS-CoV-2 target nucleic acids are NOT DETECTED.  The SARS-CoV-2 RNA is generally detectable in upper  respiratory specimens during the acute phase of infection. The lowest concentration of SARS-CoV-2 viral copies this assay can detect is 138 copies/mL. A negative result does not preclude SARS-Cov-2 infection and should not be used as the sole basis for treatment or other patient management decisions. A negative result may occur with  improper specimen collection/handling, submission of specimen other than nasopharyngeal swab, presence of viral mutation(s) within the areas targeted by this assay, and inadequate number of viral copies(<138 copies/mL). A negative result must be combined with clinical observations, patient history, and epidemiological information. The expected result is Negative.  Fact Sheet for Patients:  EntrepreneurPulse.com.au  Fact Sheet for Healthcare Providers:  IncredibleEmployment.be  This test is no t yet approved or cleared by the [place] FDA and  has been authorized for detection and/or diagnosis of SARS-CoV-2 by FDA under an Emergency Use Authorization (EUA). This EUA will remain  in effect (meaning this test can be used) for the duration of the COVID-19 declaration under Section 564(b)(1) of the Act, 21 U.S.C.section 360bbb-3(b)(1), unless the authorization is terminated  or revoked sooner.  Influenza A by PCR NEGATIVE NEGATIVE Final   Influenza B by PCR NEGATIVE NEGATIVE Final    Comment: (NOTE) The Xpert Xpress SARS-CoV-2/FLU/RSV plus assay is intended as an aid in the diagnosis of influenza from Nasopharyngeal swab specimens and should not be used as a sole basis for treatment. Nasal washings and aspirates are unacceptable for Xpert Xpress SARS-CoV-2/FLU/RSV testing.  Fact Sheet for Patients: EntrepreneurPulse.com.au  Fact Sheet for Healthcare Providers: IncredibleEmployment.be  This test is not yet approved or cleared by the [place] FDA and has been  authorized for detection and/or diagnosis of SARS-CoV-2 by FDA under an Emergency Use Authorization (EUA). This EUA will remain in effect (meaning this test can be used) for the duration of the COVID-19 declaration under Section 564(b)(1) of the Act, 21 U.S.C. section 360bbb-3(b)(1), unless the authorization is terminated or revoked.     Resp Syncytial Virus by PCR NEGATIVE NEGATIVE Final    Comment: (NOTE) Fact Sheet for Patients: EntrepreneurPulse.com.au  Fact Sheet for Healthcare Providers: IncredibleEmployment.be  This test is not yet approved or cleared by the [place] FDA and has been authorized for detection and/or diagnosis of SARS-CoV-2 by FDA under an Emergency Use Authorization (EUA). This EUA will remain in effect (meaning this test can be used) for the duration of the COVID-19 declaration under Section 564(b)(1) of the Act, 21 U.S.C. section 360bbb-3(b)(1), unless the authorization is terminated or revoked.  Performed at [facility], [place] [address], [place], [place] [address]          Radiology Studies: CT Angio Chest PE W/Cm &/Or Wo Cm  Result Date: 05/08/2022 CLINICAL DATA:  Shortness of breath. EXAM: CT ANGIOGRAPHY CHEST WITH CONTRAST TECHNIQUE: Multidetector CT imaging of the chest was performed using the standard protocol during bolus administration of intravenous contrast. Multiplanar CT image reconstructions and MIPs were obtained to evaluate the vascular anatomy. RADIATION DOSE REDUCTION: This exam was performed according to the departmental dose-optimization program which includes automated exposure control, adjustment of the mA and/or kV according to patient size and/or use of iterative reconstruction technique. CONTRAST:  50mL OMNIPAQUE IOHEXOL 350 MG/ML SOLN COMPARISON:  CT scan 09/12/2021 FINDINGS: Cardiovascular: The heart is normal in size. No pericardial effusion. Stable tortuosity and  calcification of the thoracic aorta but no focal aneurysm or dissection. Three-vessel coronary artery calcifications and surgical changes from coronary artery bypass surgery. Enlarged pulmonary artery suggesting pulmonary hypertension. No filling defects to suggest pulmonary embolism. Mediastinum/Nodes: Scattered borderline mediastinal and hilar lymph nodes appears stable and are likely reactive. No mass or overt adenopathy. The esophagus is grossly. Lungs/Pleura: Chronic small to moderate-sized right pleural effusion with overlying atelectasis. Stable significant underlying emphysematous changes and pulmonary scarring. Dense band of right upper lobe radiation fibrosis with a medial nodular component measuring 10 mm which appears stable. Findings consistent treated neoplasm. No findings suspicious for recurrent tumor. Stable significant underlying emphysematous changes. Small left upper lobe pulmonary nodule measures 6 mm and is unchanged since 2021 and considered benign. Upper Abdomen: No significant upper abdominal findings. Stable aortoiliac stent graft. Musculoskeletal: The bony thorax is intact. Review of the MIP images confirms the above findings. IMPRESSION: 1. No CT findings for pulmonary embolism. 2. Enlarged pulmonary arteries suggesting pulmonary hypertension. 3. Chronic small to moderate-sized right pleural effusion with overlying atelectasis. 4. Stable right upper lobe radiation fibrosis with a medial nodular component measuring 10 mm. No findings suspicious for recurrent tumor. 5. Stable 6 mm left upper lobe pulmonary nodule since 2021 and considered  benign. 6. Stable borderline mediastinal and hilar lymph nodes, likely reactive. 7. Stable aortoiliac stent graft. Aortic Atherosclerosis (ICD10-I70.0) and Emphysema (ICD10-J43.9). Electronically Signed   By: Marijo Sanes M.D.   On: 05/08/2022 16:36   DG Chest 2 View  Result Date: 05/08/2022 CLINICAL DATA:  Shortness of breath for 1 day. On home  oxygen. History of COPD. EXAM: CHEST - 2 VIEW COMPARISON:  CT 01/10/2022.  Radiographs 09/27/2021 and 09/12/2021. FINDINGS: The heart size and mediastinal contours are stable status post median sternotomy and CABG. There is aortic atherosclerosis. The lungs are hyperinflated. Chronic right pleural effusion and linear scarring in the right lung are unchanged. There are mildly increased interstitial markings at the left lung base without consolidation or significant left pleural effusion. No pneumothorax. The bones appear unchanged. Abdominal aortic stent graft noted. IMPRESSION: Mildly increased interstitial markings at the left lung base which could reflect mild interstitial edema or atypical infection. Chronic right pleural effusion and scarring. Electronically Signed   By: Richardean Sale M.D.   On: 05/08/2022 14:09        Scheduled Meds:  enalapril  10 mg Oral Daily   enoxaparin (LOVENOX) injection  30 mg Subcutaneous Q24H   escitalopram  5 mg Oral Daily   memantine  5 mg Oral Daily   mirtazapine  15 mg Oral QHS   simvastatin  40 mg Oral Daily   umeclidinium-vilanterol  1 puff Inhalation Daily   Continuous Infusions:   LOS: 0 days    Time spent: 23min    Shalini Mair C Saraih Lorton, DO [facility]  If 7PM-7AM, please contact night-coverage [email or web address]  05/09/2022, 7:50 AM

## 2022-05-10 ENCOUNTER — Ambulatory Visit: Payer: No Typology Code available for payment source | Admitting: Internal Medicine

## 2022-05-10 DIAGNOSIS — J9621 Acute and chronic respiratory failure with hypoxia: Secondary | ICD-10-CM | POA: Diagnosis not present

## 2022-05-10 LAB — CBC
HCT: 30.4 % — ABNORMAL LOW (ref 39.0–52.0)
Hemoglobin: 9.3 g/dL — ABNORMAL LOW (ref 13.0–17.0)
MCH: 28.3 pg (ref 26.0–34.0)
MCHC: 30.6 g/dL (ref 30.0–36.0)
MCV: 92.4 fL (ref 80.0–100.0)
Platelets: 175 10*3/uL (ref 150–400)
RBC: 3.29 MIL/uL — ABNORMAL LOW (ref 4.22–5.81)
RDW: 17.3 % — ABNORMAL HIGH (ref 11.5–15.5)
WBC: 5 10*3/uL (ref 4.0–10.5)
nRBC: 0 % (ref 0.0–0.2)

## 2022-05-10 LAB — OCCULT BLOOD X 1 CARD TO LAB, STOOL: Fecal Occult Bld: NEGATIVE

## 2022-05-10 LAB — BASIC METABOLIC PANEL
Anion gap: 6 (ref 5–15)
BUN: 39 mg/dL — ABNORMAL HIGH (ref 8–23)
CO2: 27 mmol/L (ref 22–32)
Calcium: 8.2 mg/dL — ABNORMAL LOW (ref 8.9–10.3)
Chloride: 103 mmol/L (ref 98–111)
Creatinine, Ser: 2.09 mg/dL — ABNORMAL HIGH (ref 0.61–1.24)
GFR, Estimated: 30 mL/min — ABNORMAL LOW (ref >60)
Glucose, Bld: 148 mg/dL — ABNORMAL HIGH (ref 70–99)
Potassium: 5.1 mmol/L (ref 3.5–5.1)
Sodium: 136 mmol/L (ref 135–145)

## 2022-05-10 MED ORDER — ORAL CARE MOUTH RINSE: 15.0000 mL | OROMUCOSAL | Status: DC | PRN

## 2022-05-10 MED ORDER — SODIUM CHLORIDE 0.9 % IV SOLN: INTRAVENOUS | Status: AC

## 2022-05-10 NOTE — Progress Notes (Addendum)
PROGRESS NOTE    Brian [name]  [id number] DOB: November 17, 1931 DOA: 05/08/2022 PCP: Binnie Rail, MD   Brief Narrative:  [name] is a 89 y.o. male with a complex medical history significant for coronary artery disease, status post CABG, COPD on 2 to 3 L nasal cannula oxygen chronically, diet-controlled type 2 diabetes.  Hypertension, hyperlipidemia, chronic pleural effusion who had a recent stay in this facility for anemia/GI bleed who now presents to the emergency department with complaints of shortness of breath. Worsening fatigue, dyspnea on exertion, limited to 3-4 steps before needing to rest. Found to be profoundly hypoxic at home (70%).  Assessment & Plan:   Principal Problem:   Acute on chronic respiratory failure with hypoxia (HCC) Active Problems:   Chronic diastolic heart failure (HCC)   COPD (chronic obstructive pulmonary disease) (HCC)   Stage 3b chronic kidney disease (CKD) (HCC)   Hypertension   Hyperlipidemia   CAD (coronary artery disease)   Diabetes mellitus type 2 without retinopathy (HCC)   Mixed Alzheimer's and vascular dementia (HCC)   Pleural effusion due to CHF (congestive heart failure) (HCC)   SVT (supraventricular tachycardia)  Acute on chronic respiratory failure with hypoxia Presumed COPD flare - Continue to wean oxygen as tolerated - Unclear provoking event, presumed COPD exacerbation as above given negative PE chest x-ray and CT scans. -Start low-dose steroids, titrate down as appropriate - Continue incentive spirometry, no antibiotics indicated at this time -Continue Anoro, as needed nebs/bronchodilators -Anxiety likely playing a role in patient's respiratory status as well, continue low-dose Ativan, use sparingly given patient's age -Patient remains markedly hypoxic with exertion although appears to be close to his baseline at rest  AKI on CKD 3B -increase p.o. intake, likely prerenal, follow repeat labs   Chronic diastolic heart  failure euvolemic, continue enalapril Hypertension -well-controlled on current regimen Hyperlipidemia -continue statin Diabetes type 2, controlled -diet controlled; monitor closely while on steroids Mixed Alzheimer's and vascular dementia-continue Lexapro, Namenda  Goals of care -lengthy discussion with daughter today about goals of care given patient's age comorbidities.  Apparently patient does have a living will which the family will attempt to make available for the hospital system so he can be scanned into records.  We also discussed possible need for palliative care moving forward given his multiple issues.  They are also agreeable to follow-up with palliative care in the future through the [place].  DVT prophylaxis: Lovenox Code Status: Full Family Communication: Daughter updated at bedside  Status is: Inpatient  Dispo: The patient is from: Home              Anticipated d/c is to: To be determined              Anticipated d/c date is: 48 to 72 hours              Patient currently not medically stable for discharge  Consultants:  None  Procedures:  None  Antimicrobials:  None indicated  Subjective: No acute issues or events overnight, patient appears to be much more energetic today.  States he feels much better but continues to be dyspneic with exertion.  Denies chest pain nausea vomiting diarrhea constipation headache fevers or chills  Objective: Vitals:   05/09/22 1329 05/09/22 2152 05/10/22 0041 05/10/22 0512  BP: (!) 151/68 (!) 156/70  (!) 147/59  Pulse: 62 69  67  Resp: 18 20 20    Temp: 97.7 F (36.5 C) 97.9 F (36.6 C)  97.7  F (36.5 C)  TempSrc:  Oral  Oral  SpO2: 96% 96% 96% 97%  Weight:      Height:        Intake/Output Summary (Last 24 hours) at 05/10/2022 0737 Last data filed at 05/09/2022 2150 Gross per 24 hour  Intake 970 ml  Output 225 ml  Net 745 ml    Filed Weights   05/08/22 1322 05/08/22 1926  Weight: 67 kg 65 kg    Examination: General:  Pleasantly resting in bed, No acute distress. HEENT: Normocephalic atraumatic.  Sclerae nonicteric, noninjected.  Extraocular movements intact bilaterally. Neck: Without mass or deformity.  Trachea is midline. Lungs: Scant end expiratory wheeze right greater than left. Heart: Regular rate and rhythm.  Without murmurs, rubs, or gallops. Abdomen: Soft, nontender, nondistended.  Without guarding or rebound. Extremities: Without cyanosis, clubbing, edema, or obvious deformity. Skin:  Warm and dry, no erythema  Data Reviewed: I have personally reviewed following labs and imaging studies  CBC: Recent Labs  Lab 05/08/22 1335 05/09/22 0526 05/10/22 0502  WBC 5.4 4.9 5.0  NEUTROABS 3.9  --   --   HGB 10.4* 8.6* 9.3*  HCT 34.3* 28.7* 30.4*  MCV 93.0 93.8 92.4  PLT 172 144* 0000000    Basic Metabolic Panel: Recent Labs  Lab 05/08/22 1335 05/09/22 0526 05/10/22 0502  NA 140 140 136  K 4.8 4.6 5.1  CL 105 104 103  CO2 25 28 27   GLUCOSE 219* 117* 148*  BUN 32* 33* 39*  CREATININE 1.74* 1.70* 2.09*  CALCIUM 8.6* 8.4* 8.2*    GFR: Estimated Creatinine Clearance: 22.5 mL/min (A) (by C-G formula based on SCr of 2.09 mg/dL (H)).  Liver Function Tests: Recent Labs  Lab 05/08/22 1335  AST 18  ALT 12  ALKPHOS 56  BILITOT 0.9  PROT 7.5  ALBUMIN 3.9    Recent Results (from the past 240 hour(s))  Resp panel by RT-PCR (RSV, Flu A&B, Covid) Anterior Nasal Swab     Status: None   Collection Time: 05/08/22  4:55 PM   Specimen: Anterior Nasal Swab  Result Value Ref Range Status   SARS Coronavirus 2 by RT PCR NEGATIVE NEGATIVE Final    Comment: (NOTE) SARS-CoV-2 target nucleic acids are NOT DETECTED.  The SARS-CoV-2 RNA is generally detectable in upper respiratory specimens during the acute phase of infection. The lowest concentration of SARS-CoV-2 viral copies this assay can detect is 138 copies/mL. A negative result does not preclude SARS-Cov-2 infection and should not be used as  the sole basis for treatment or other patient management decisions. A negative result may occur with  improper specimen collection/handling, submission of specimen other than nasopharyngeal swab, presence of viral mutation(s) within the areas targeted by this assay, and inadequate number of viral copies(<138 copies/mL). A negative result must be combined with clinical observations, patient history, and epidemiological information. The expected result is Negative.  Fact Sheet for Patients:  EntrepreneurPulse.com.au  Fact Sheet for Healthcare Providers:  IncredibleEmployment.be  This test is no t yet approved or cleared by the [place] FDA and  has been authorized for detection and/or diagnosis of SARS-CoV-2 by FDA under an Emergency Use Authorization (EUA). This EUA will remain  in effect (meaning this test can be used) for the duration of the COVID-19 declaration under Section 564(b)(1) of the Act, 21 U.S.C.section 360bbb-3(b)(1), unless the authorization is terminated  or revoked sooner.       Influenza A by PCR NEGATIVE NEGATIVE Final  Influenza B by PCR NEGATIVE NEGATIVE Final    Comment: (NOTE) The Xpert Xpress SARS-CoV-2/FLU/RSV plus assay is intended as an aid in the diagnosis of influenza from Nasopharyngeal swab specimens and should not be used as a sole basis for treatment. Nasal washings and aspirates are unacceptable for Xpert Xpress SARS-CoV-2/FLU/RSV testing.  Fact Sheet for Patients: EntrepreneurPulse.com.au  Fact Sheet for Healthcare Providers: IncredibleEmployment.be  This test is not yet approved or cleared by the [place] FDA and has been authorized for detection and/or diagnosis of SARS-CoV-2 by FDA under an Emergency Use Authorization (EUA). This EUA will remain in effect (meaning this test can be used) for the duration of the COVID-19 declaration under Section 564(b)(1) of  the Act, 21 U.S.C. section 360bbb-3(b)(1), unless the authorization is terminated or revoked.     Resp Syncytial Virus by PCR NEGATIVE NEGATIVE Final    Comment: (NOTE) Fact Sheet for Patients: EntrepreneurPulse.com.au  Fact Sheet for Healthcare Providers: IncredibleEmployment.be  This test is not yet approved or cleared by the [place] FDA and has been authorized for detection and/or diagnosis of SARS-CoV-2 by FDA under an Emergency Use Authorization (EUA). This EUA will remain in effect (meaning this test can be used) for the duration of the COVID-19 declaration under Section 564(b)(1) of the Act, 21 U.S.C. section 360bbb-3(b)(1), unless the authorization is terminated or revoked.  Performed at [facility], [place] [address], [place], [place] [address]     Radiology Studies: CT Angio Chest PE W/Cm &/Or Wo Cm  Result Date: 05/08/2022 CLINICAL DATA:  Shortness of breath. EXAM: CT ANGIOGRAPHY CHEST WITH CONTRAST TECHNIQUE: Multidetector CT imaging of the chest was performed using the standard protocol during bolus administration of intravenous contrast. Multiplanar CT image reconstructions and MIPs were obtained to evaluate the vascular anatomy. RADIATION DOSE REDUCTION: This exam was performed according to the departmental dose-optimization program which includes automated exposure control, adjustment of the mA and/or kV according to patient size and/or use of iterative reconstruction technique. CONTRAST:  72mL OMNIPAQUE IOHEXOL 350 MG/ML SOLN COMPARISON:  CT scan 09/12/2021 FINDINGS: Cardiovascular: The heart is normal in size. No pericardial effusion. Stable tortuosity and calcification of the thoracic aorta but no focal aneurysm or dissection. Three-vessel coronary artery calcifications and surgical changes from coronary artery bypass surgery. Enlarged pulmonary artery suggesting pulmonary hypertension. No filling defects to  suggest pulmonary embolism. Mediastinum/Nodes: Scattered borderline mediastinal and hilar lymph nodes appears stable and are likely reactive. No mass or overt adenopathy. The esophagus is grossly. Lungs/Pleura: Chronic small to moderate-sized right pleural effusion with overlying atelectasis. Stable significant underlying emphysematous changes and pulmonary scarring. Dense band of right upper lobe radiation fibrosis with a medial nodular component measuring 10 mm which appears stable. Findings consistent treated neoplasm. No findings suspicious for recurrent tumor. Stable significant underlying emphysematous changes. Small left upper lobe pulmonary nodule measures 6 mm and is unchanged since 2021 and considered benign. Upper Abdomen: No significant upper abdominal findings. Stable aortoiliac stent graft. Musculoskeletal: The bony thorax is intact. Review of the MIP images confirms the above findings. IMPRESSION: 1. No CT findings for pulmonary embolism. 2. Enlarged pulmonary arteries suggesting pulmonary hypertension. 3. Chronic small to moderate-sized right pleural effusion with overlying atelectasis. 4. Stable right upper lobe radiation fibrosis with a medial nodular component measuring 10 mm. No findings suspicious for recurrent tumor. 5. Stable 6 mm left upper lobe pulmonary nodule since 2021 and considered benign. 6. Stable borderline mediastinal and hilar lymph nodes, likely reactive. 7. Stable aortoiliac  stent graft. Aortic Atherosclerosis (ICD10-I70.0) and Emphysema (ICD10-J43.9). Electronically Signed   By: Marijo Sanes M.D.   On: 05/08/2022 16:36   DG Chest 2 View  Result Date: 05/08/2022 CLINICAL DATA:  Shortness of breath for 1 day. On home oxygen. History of COPD. EXAM: CHEST - 2 VIEW COMPARISON:  CT 01/10/2022.  Radiographs 09/27/2021 and 09/12/2021. FINDINGS: The heart size and mediastinal contours are stable status post median sternotomy and CABG. There is aortic atherosclerosis. The lungs are  hyperinflated. Chronic right pleural effusion and linear scarring in the right lung are unchanged. There are mildly increased interstitial markings at the left lung base without consolidation or significant left pleural effusion. No pneumothorax. The bones appear unchanged. Abdominal aortic stent graft noted. IMPRESSION: Mildly increased interstitial markings at the left lung base which could reflect mild interstitial edema or atypical infection. Chronic right pleural effusion and scarring. Electronically Signed   By: Richardean Sale M.D.   On: 05/08/2022 14:09    Scheduled Meds:  enalapril  10 mg Oral Daily   enoxaparin (LOVENOX) injection  30 mg Subcutaneous Q24H   escitalopram  5 mg Oral Daily   memantine  5 mg Oral Daily   mirtazapine  15 mg Oral QHS   predniSONE  20 mg Oral BID WC   simvastatin  40 mg Oral Daily   umeclidinium-vilanterol  1 puff Inhalation Daily   Continuous Infusions:   LOS: 0 days   Time spent: 75min  Estes Lehner C Kearah Gayden, DO [facility]  If 7PM-7AM, please contact night-coverage [email or web address]  05/10/2022, 7:37 AM

## 2022-05-10 NOTE — Plan of Care (Signed)
  Problem: Education: Goal: Knowledge of General Education information will improve Description: Including pain rating scale, medication(s)/side effects and non-pharmacologic comfort measures Outcome: Adequate for Discharge   Problem: Health Behavior/Discharge Planning: Goal: Ability to manage health-related needs will improve Outcome: Adequate for Discharge   Problem: Clinical Measurements: Goal: Ability to maintain clinical measurements within normal limits will improve Outcome: Adequate for Discharge Goal: Diagnostic test results will improve Outcome: Adequate for Discharge Goal: Respiratory complications will improve Outcome: Adequate for Discharge   Problem: Nutrition: Goal: Adequate nutrition will be maintained Outcome: Adequate for Discharge   Problem: Elimination: Goal: Will not experience complications related to urinary retention Outcome: Adequate for Discharge

## 2022-05-10 NOTE — Progress Notes (Signed)
SATURATION QUALIFICATIONS: (This note is used to comply with regulatory documentation for home oxygen)  Patient Saturations on 3 liters  [place] (baseline at home) = 92%  Patient Saturations on 3 liters [place]  while Ambulating = 84%  Patient Saturations on 6 Liters of oxygen while Ambulating = 89%  Patient Saturations on 5 liters after ambulation = 94%.  Please briefly explain why patient needs home oxygen:  Pt was on home oxygen at 3 liters [place] prior to admission. Requires increase to 6 liters when ambulating to maintain sat above 90%.  Lilith Solana, Laurel Dimmer, RN

## 2022-05-10 NOTE — Plan of Care (Signed)
  Problem: Education: Goal: Knowledge of General Education information will improve Description: Including pain rating scale, medication(s)/side effects and non-pharmacologic comfort measures Outcome: Completed/Met   Problem: Clinical Measurements: Goal: Respiratory complications will improve Outcome: Progressing Goal: Cardiovascular complication will be avoided Outcome: Progressing   Problem: Nutrition: Goal: Adequate nutrition will be maintained Outcome: Progressing   Problem: Elimination: Goal: Will not experience complications related to urinary retention Outcome: Progressing

## 2022-05-11 DIAGNOSIS — E785 Hyperlipidemia, unspecified: Secondary | ICD-10-CM | POA: Diagnosis present

## 2022-05-11 DIAGNOSIS — I2510 Atherosclerotic heart disease of native coronary artery without angina pectoris: Secondary | ICD-10-CM | POA: Diagnosis present

## 2022-05-11 DIAGNOSIS — I130 Hypertensive heart and chronic kidney disease with heart failure and stage 1 through stage 4 chronic kidney disease, or unspecified chronic kidney disease: Secondary | ICD-10-CM | POA: Diagnosis present

## 2022-05-11 DIAGNOSIS — F02A4 Dementia in other diseases classified elsewhere, mild, with anxiety: Secondary | ICD-10-CM | POA: Diagnosis present

## 2022-05-11 DIAGNOSIS — J441 Chronic obstructive pulmonary disease with (acute) exacerbation: Secondary | ICD-10-CM | POA: Diagnosis present

## 2022-05-11 DIAGNOSIS — D631 Anemia in chronic kidney disease: Secondary | ICD-10-CM | POA: Diagnosis present

## 2022-05-11 DIAGNOSIS — I714 Abdominal aortic aneurysm, without rupture, unspecified: Secondary | ICD-10-CM | POA: Diagnosis present

## 2022-05-11 DIAGNOSIS — G309 Alzheimer's disease, unspecified: Secondary | ICD-10-CM | POA: Diagnosis present

## 2022-05-11 DIAGNOSIS — J9621 Acute and chronic respiratory failure with hypoxia: Secondary | ICD-10-CM | POA: Diagnosis present

## 2022-05-11 DIAGNOSIS — Z951 Presence of aortocoronary bypass graft: Secondary | ICD-10-CM | POA: Diagnosis not present

## 2022-05-11 DIAGNOSIS — I471 Supraventricular tachycardia, unspecified: Secondary | ICD-10-CM | POA: Diagnosis present

## 2022-05-11 DIAGNOSIS — R195 Other fecal abnormalities: Secondary | ICD-10-CM | POA: Diagnosis present

## 2022-05-11 DIAGNOSIS — I5032 Chronic diastolic (congestive) heart failure: Secondary | ICD-10-CM | POA: Diagnosis present

## 2022-05-11 DIAGNOSIS — Z87891 Personal history of nicotine dependence: Secondary | ICD-10-CM | POA: Diagnosis not present

## 2022-05-11 DIAGNOSIS — R0602 Shortness of breath: Secondary | ICD-10-CM | POA: Diagnosis present

## 2022-05-11 DIAGNOSIS — N179 Acute kidney failure, unspecified: Secondary | ICD-10-CM | POA: Diagnosis present

## 2022-05-11 DIAGNOSIS — E1122 Type 2 diabetes mellitus with diabetic chronic kidney disease: Secondary | ICD-10-CM | POA: Diagnosis present

## 2022-05-11 DIAGNOSIS — N1832 Chronic kidney disease, stage 3b: Secondary | ICD-10-CM | POA: Diagnosis present

## 2022-05-11 DIAGNOSIS — Z1152 Encounter for screening for COVID-19: Secondary | ICD-10-CM | POA: Diagnosis not present

## 2022-05-11 DIAGNOSIS — Z8249 Family history of ischemic heart disease and other diseases of the circulatory system: Secondary | ICD-10-CM | POA: Diagnosis not present

## 2022-05-11 DIAGNOSIS — F01A4 Vascular dementia, mild, with anxiety: Secondary | ICD-10-CM | POA: Diagnosis present

## 2022-05-11 DIAGNOSIS — M1990 Unspecified osteoarthritis, unspecified site: Secondary | ICD-10-CM | POA: Diagnosis present

## 2022-05-11 DIAGNOSIS — Z7982 Long term (current) use of aspirin: Secondary | ICD-10-CM | POA: Diagnosis not present

## 2022-05-11 DIAGNOSIS — I252 Old myocardial infarction: Secondary | ICD-10-CM | POA: Diagnosis not present

## 2022-05-11 DIAGNOSIS — Z806 Family history of leukemia: Secondary | ICD-10-CM | POA: Diagnosis not present

## 2022-05-11 LAB — BASIC METABOLIC PANEL
Anion gap: 8 (ref 5–15)
BUN: 51 mg/dL — ABNORMAL HIGH (ref 8–23)
CO2: 24 mmol/L (ref 22–32)
Calcium: 7.9 mg/dL — ABNORMAL LOW (ref 8.9–10.3)
Chloride: 102 mmol/L (ref 98–111)
Creatinine, Ser: 2.45 mg/dL — ABNORMAL HIGH (ref 0.61–1.24)
GFR, Estimated: 25 mL/min — ABNORMAL LOW (ref >60)
Glucose, Bld: 184 mg/dL — ABNORMAL HIGH (ref 70–99)
Potassium: 4.9 mmol/L (ref 3.5–5.1)
Sodium: 134 mmol/L — ABNORMAL LOW (ref 135–145)

## 2022-05-11 LAB — CBC
HCT: 29.1 % — ABNORMAL LOW (ref 39.0–52.0)
Hemoglobin: 8.9 g/dL — ABNORMAL LOW (ref 13.0–17.0)
MCH: 28.3 pg (ref 26.0–34.0)
MCHC: 30.6 g/dL (ref 30.0–36.0)
MCV: 92.4 fL (ref 80.0–100.0)
Platelets: 169 10*3/uL (ref 150–400)
RBC: 3.15 MIL/uL — ABNORMAL LOW (ref 4.22–5.81)
RDW: 17.1 % — ABNORMAL HIGH (ref 11.5–15.5)
WBC: 6.1 10*3/uL (ref 4.0–10.5)
nRBC: 0 % (ref 0.0–0.2)

## 2022-05-11 MED ORDER — FUROSEMIDE 40 MG PO TABS
40.0000 mg | ORAL_TABLET | Freq: Every day | ORAL | Status: DC
  Administered 2022-05-11 – 2022-05-12 (×2): 40 mg via ORAL
  Filled 2022-05-11 (×2): qty 1

## 2022-05-11 MED ORDER — BOOST / RESOURCE BREEZE PO LIQD CUSTOM
1.0000 | Freq: Three times a day (TID) | ORAL | Status: DC
  Administered 2022-05-11 – 2022-05-12 (×3): 1 via ORAL

## 2022-05-11 NOTE — Plan of Care (Signed)
  PROGRESS NOTE    Brian [name]  [id number] DOB: 1931/08/05 DOA: 05/08/2022 PCP: Binnie Rail, MD   Brief Narrative:   Goals of care -lengthy discussion with daughter today about goals of care given patient's age comorbidities.  Apparently patient does have a living will which the family will attempt to make available for the hospital system so he can be scanned into records.  We also discussed possible need for palliative care moving forward given his multiple issues. They are also agreeable to follow-up with palliative care in the future through the [place].   [name], DO [facility] 05/11/2022, 2:22 PM

## 2022-05-11 NOTE — Progress Notes (Signed)
Report received from previous nurse. Agree with previous assessment. Patient in NAD, call light within reach, and bed alarm engaged. Will continue with plan of care.

## 2022-05-11 NOTE — Progress Notes (Signed)
Initial Nutrition Assessment  INTERVENTION:   -Boost Breeze po TID, each supplement provides 250 kcal and 9 grams of protein   NUTRITION DIAGNOSIS:   Inadequate oral intake related to acute illness as evidenced by per patient/family report.  GOAL:   Patient will meet greater than or equal to 90% of their needs  MONITOR:   PO intake, Supplement acceptance, Weight trends, Labs, I & O's  REASON FOR ASSESSMENT:   Malnutrition Screening Tool    ASSESSMENT:   89 y.o. male with a complex medical history significant for coronary artery disease, status post CABG, COPD on 2 to 3 L nasal cannula oxygen chronically, diet-controlled type 2 diabetes.  Hypertension, hyperlipidemia, chronic pleural effusion who had a recent stay in this facility for anemia/GI bleed who now presents to the emergency department with complaints of shortness of breath. Worsening fatigue, dyspnea on exertion, limited to 3-4 steps before needing to rest.  Patient in room ,daughter at bedside. Pt states he has a good appetite but daughter states he hasn't had an appetite PTA but since starting prednisone it has improved significantly.  Pt ate 100% of his breakfast this morning.  Pt likes clear liquid Boost supplements, will order Boost Breeze. Provided one at end of visit.  Pt denies issues with swallowing or chewing.   Per weight records, pt has lost 9 lbs since 12/27/21 (6% wt loss x 4.5 months, insignificant for time frame).  Medications: Lasix, Remeron  Labs reviewed: Low Na   NUTRITION - FOCUSED PHYSICAL EXAM:  Flowsheet Row Most Recent Value  Orbital Region Mild depletion  Upper Arm Region Mild depletion  Thoracic and Lumbar Region Unable to assess  Buccal Region Mild depletion  Temple Region Moderate depletion  Clavicle Bone Region Unable to assess  Clavicle and Acromion Bone Region Unable to assess  Scapular Bone Region Unable to assess  Dorsal Hand Unable to assess  Patellar Region Unable to  assess  Anterior Thigh Region Unable to assess  Posterior Calf Region Unable to assess  Edema (RD Assessment) None  Hair Reviewed  Eyes Reviewed  Mouth Reviewed  Skin Reviewed  Nails Reviewed       Diet Order:   Diet Order             Diet regular Room service appropriate? Yes; Fluid consistency: Thin  Diet effective now                   EDUCATION NEEDS:   No education needs have been identified at this time  Skin:  Skin Assessment: Reviewed RN Assessment  Last BM:  3/21 -type 2  Height:   Ht Readings from Last 1 Encounters:  05/08/22 5\' 11"  (1.803 m)    Weight:   Wt Readings from Last 1 Encounters:  05/08/22 65 kg    BMI:  Body mass index is 19.99 kg/m.  Estimated Nutritional Needs:   Kcal:  1600-1800  Protein:  75-85g  Fluid:  1.8L/day  Clayton Bibles, MS, RD, LDN Inpatient Clinical Dietitian Contact information available via Amion

## 2022-05-11 NOTE — Progress Notes (Incomplete)
PROGRESS NOTE    Brian [name]  [id number] DOB: May 14, 1931 DOA: 05/08/2022 PCP: Binnie Rail, MD   Brief Narrative:  Brian [name] is a 89 y.o. male with a complex medical history significant for coronary artery disease, status post CABG, COPD on 2 to 3 L nasal cannula oxygen chronically, diet-controlled type 2 diabetes.  Hypertension, hyperlipidemia, chronic pleural effusion who had a recent stay in this facility for anemia/GI bleed who now presents to the emergency department with complaints of shortness of breath. Worsening fatigue, dyspnea on exertion, limited to 3-4 steps before needing to rest. Found to be profoundly hypoxic at home (70%).  Assessment & Plan:   Principal Problem:   Acute on chronic respiratory failure with hypoxia (HCC) Active Problems:   Chronic diastolic heart failure (HCC)   COPD (chronic obstructive pulmonary disease) (HCC)   Stage 3b chronic kidney disease (CKD) (HCC)   Hypertension   Hyperlipidemia   CAD (coronary artery disease)   Diabetes mellitus type 2 without retinopathy (HCC)   Mixed Alzheimer's and vascular dementia (HCC)   Pleural effusion due to CHF (congestive heart failure) (HCC)   SVT (supraventricular tachycardia)   Acute on chronic respiratory failure with hypoxia Presumed COPD flare - Continue to wean oxygen as tolerated - unfortunately this may be patient's new baseline - Unclear provoking event, presumed COPD exacerbation as above given negative PE chest x-ray and CT scans. -Start low-dose steroids, titrate down as appropriate - Continue incentive spirometry, no antibiotics indicated at this time - Continue Anoro, as needed nebs/bronchodilators - Anxiety likely playing a role in patient's respiratory status as well but even when comfortable and resting he remains moderately hypoxic - Patient still requiring upwards of 6L  with exertion to maintain 90% and 6L at rest at only 93% - he remains moderately symptomatic with even  short ambulation.  AKI on CKD 3B  UOP continues to be poor - increase PO intake and resume lasix Creatinine approaching baseline  Chronic diastolic heart failure continue enalapril Hypertension -well-controlled on current regimen Hyperlipidemia -continue statin Diabetes type 2, controlled -diet controlled; monitor closely while on steroids Mixed Alzheimer's and vascular dementia-continue Lexapro, Namenda  DVT prophylaxis: Lovenox Code Status: Full Family Communication: Daughter updated over the phone  Status is: Inpatient  Dispo: The patient is from: Home              Anticipated d/c is to: To be determined              Anticipated d/c date is: 48 to 72 hours              Patient currently not medically stable for discharge  Consultants:  None  Procedures:  None  Antimicrobials:  None indicated  Subjective: No acute issues or events overnight, patient appears to be much more energetic today.  States he feels much better but continues to be dyspneic with exertion.  Denies chest pain nausea vomiting diarrhea constipation headache fevers or chills  Objective: Vitals:   05/10/22 1250 05/10/22 2132 05/10/22 2346 05/11/22 0608  BP: (!) 156/86 (!) 166/82 (!) 158/66 (!) 152/67  Pulse: 67 72  61  Resp: 17 18  20   Temp: (!) 97.4 F (36.3 C) 98.2 F (36.8 C)  97.8 F (36.6 C)  TempSrc:  Oral  Oral  SpO2: 96% 95%  99%  Weight:      Height:        Intake/Output Summary (Last 24 hours) at 05/11/2022 [id number] Last data  filed at 05/11/2022 0444 Gross per 24 hour  Intake 600 ml  Output 175 ml  Net 425 ml    Filed Weights   05/08/22 1322 05/08/22 1926  Weight: 67 kg 65 kg    Examination: General: Pleasantly resting in bed, No acute distress. HEENT: Normocephalic atraumatic.  Sclerae nonicteric, noninjected.  Extraocular movements intact bilaterally. Neck: Without mass or deformity.  Trachea is midline. Lungs: Scant end expiratory wheeze right greater than left. Heart:  Regular rate and rhythm.  Without murmurs, rubs, or gallops. Abdomen: Soft, nontender, nondistended.  Without guarding or rebound. Extremities: Without cyanosis, clubbing, edema, or obvious deformity. Skin:  Warm and dry, no erythema  Data Reviewed: I have personally reviewed following labs and imaging studies  CBC: Recent Labs  Lab 05/08/22 1335 05/09/22 0526 05/10/22 0502 05/11/22 0531  WBC 5.4 4.9 5.0 6.1  NEUTROABS 3.9  --   --   --   HGB 10.4* 8.6* 9.3* 8.9*  HCT 34.3* 28.7* 30.4* 29.1*  MCV 93.0 93.8 92.4 92.4  PLT 172 144* 175 [id number]    Basic Metabolic Panel: Recent Labs  Lab 05/08/22 1335 05/09/22 0526 05/10/22 0502 05/11/22 0531  NA 140 140 136 134*  K 4.8 4.6 5.1 4.9  CL 105 104 103 102  CO2 25 28 27 24   GLUCOSE 219* 117* 148* 184*  BUN 32* 33* 39* 51*  CREATININE 1.74* 1.70* 2.09* 2.45*  CALCIUM 8.6* 8.4* 8.2* 7.9*    GFR: Estimated Creatinine Clearance: 19.2 mL/min (A) (by C-G formula based on SCr of 2.45 mg/dL (H)).  Liver Function Tests: Recent Labs  Lab 05/08/22 1335  AST 18  ALT 12  ALKPHOS 56  BILITOT 0.9  PROT 7.5  ALBUMIN 3.9    Recent Results (from the past 240 hour(s))  Resp panel by RT-PCR (RSV, Flu A&B, Covid) Anterior Nasal Swab     Status: None   Collection Time: 05/08/22  4:55 PM   Specimen: Anterior Nasal Swab  Result Value Ref Range Status   SARS Coronavirus 2 by RT PCR NEGATIVE NEGATIVE Final    Comment: (NOTE) SARS-CoV-2 target nucleic acids are NOT DETECTED.  The SARS-CoV-2 RNA is generally detectable in upper respiratory specimens during the acute phase of infection. The lowest concentration of SARS-CoV-2 viral copies this assay can detect is 138 copies/mL. A negative result does not preclude SARS-Cov-2 infection and should not be used as the sole basis for treatment or other patient management decisions. A negative result may occur with  improper specimen collection/handling, submission of specimen other than  nasopharyngeal swab, presence of viral mutation(s) within the areas targeted by this assay, and inadequate number of viral copies(<138 copies/mL). A negative result must be combined with clinical observations, patient history, and epidemiological information. The expected result is Negative.  Fact Sheet for Patients:  EntrepreneurPulse.com.au  Fact Sheet for Healthcare Providers:  IncredibleEmployment.be  This test is no t yet approved or cleared by the [place] FDA and  has been authorized for detection and/or diagnosis of SARS-CoV-2 by FDA under an Emergency Use Authorization (EUA). This EUA will remain  in effect (meaning this test can be used) for the duration of the COVID-19 declaration under Section 564(b)(1) of the Act, 21 U.S.C.section 360bbb-3(b)(1), unless the authorization is terminated  or revoked sooner.       Influenza A by PCR NEGATIVE NEGATIVE Final   Influenza B by PCR NEGATIVE NEGATIVE Final    Comment: (NOTE) The Xpert Xpress SARS-CoV-2/FLU/RSV plus assay is intended  as an aid in the diagnosis of influenza from Nasopharyngeal swab specimens and should not be used as a sole basis for treatment. Nasal washings and aspirates are unacceptable for Xpert Xpress SARS-CoV-2/FLU/RSV testing.  Fact Sheet for Patients: EntrepreneurPulse.com.au  Fact Sheet for Healthcare Providers: IncredibleEmployment.be  This test is not yet approved or cleared by the [place] FDA and has been authorized for detection and/or diagnosis of SARS-CoV-2 by FDA under an Emergency Use Authorization (EUA). This EUA will remain in effect (meaning this test can be used) for the duration of the COVID-19 declaration under Section 564(b)(1) of the Act, 21 U.S.C. section 360bbb-3(b)(1), unless the authorization is terminated or revoked.     Resp Syncytial Virus by PCR NEGATIVE NEGATIVE Final    Comment:  (NOTE) Fact Sheet for Patients: EntrepreneurPulse.com.au  Fact Sheet for Healthcare Providers: IncredibleEmployment.be  This test is not yet approved or cleared by the [place] FDA and has been authorized for detection and/or diagnosis of SARS-CoV-2 by FDA under an Emergency Use Authorization (EUA). This EUA will remain in effect (meaning this test can be used) for the duration of the COVID-19 declaration under Section 564(b)(1) of the Act, 21 U.S.C. section 360bbb-3(b)(1), unless the authorization is terminated or revoked.  Performed at [facility], [place] [address] [place], [place] [address]     Radiology Studies: No results found.  Scheduled Meds:  enalapril  10 mg Oral Daily   enoxaparin (LOVENOX) injection  30 mg Subcutaneous Q24H   escitalopram  5 mg Oral Daily   memantine  5 mg Oral Daily   mirtazapine  15 mg Oral QHS   predniSONE  20 mg Oral BID WC   simvastatin  40 mg Oral Daily   umeclidinium-vilanterol  1 puff Inhalation Daily   Continuous Infusions:   LOS: 0 days   Time spent: 63min  Jilda Kress C Lakeria Starkman, DO [facility]  If 7PM-7AM, please contact night-coverage [email or web address]  05/11/2022, 8:03 AM

## 2022-05-11 NOTE — Progress Notes (Signed)
  Transition of Care [facility]) Screening Note   Patient Details  Name: Brian [name] Date of Birth: Jul 15, 1931   Transition of Care [facility]) CM/SW Contact:    Dessa Phi, RN Phone Number: 05/11/2022, 3:55 PM    Transition of Care Department [facility] has reviewed patient and no TOC needs have been identified at this time. We will continue to monitor patient advancement through interdisciplinary progression rounds. If new patient transition needs arise, please place a TOC consult. Cr

## 2022-05-11 NOTE — Discharge Summary (Signed)
Physician Discharge Summary  CASTOR PRESS [id number] DOB: 1931-06-25 DOA: 05/08/2022  PCP: Binnie Rail, MD  Admit date: 05/08/2022 Discharge date: 05/12/2022  Admitted From: Home Disposition: Home  Recommendations for Outpatient Follow-up:  Follow up with PCP in 1-2 weeks Follow-up closely with pulmonology in the next 1 to 2 weeks as discussed Follow-up with family for palliative care discussion given patient's advanced age and worsening condition  Home Health: None Equipment/Devices: None, increased oxygen as below  Discharge Condition:Stable  CODE STATUS: Full **of note patient does have a living well but it is not available at this time Diet recommendation: Regular diet as tolerated  Brief/Interim Summary: Brian [name] is a 89 y.o. male with a complex medical history significant for coronary artery disease, status post CABG, COPD on 2 to 3 L nasal cannula oxygen chronically, diet-controlled type 2 diabetes.  Hypertension, hyperlipidemia, chronic pleural effusion who had a recent stay in this facility for anemia/GI bleed who now presents to the emergency department with complaints of shortness of breath. Worsening fatigue, dyspnea on exertion, limited to 3-4 steps before needing to rest. Found to be profoundly hypoxic at home (70%).   Patient admitted as above with acute hypoxic respiratory failure presumed to be COPD exacerbation given negative imaging and infectious workup.  Patient lives alone at home after lengthy discussion with family it appears patient might have been hypoxic for quite some time.  He is not able to wean down off of his 6 L nasal cannula with exertion, his sats remain borderline but stable, he is able to walk 500 feet today without any dyspnea dizziness lightheadedness or shortness of breath despite saturations as low as 88% transiently.  Have discussed need for close follow-up with pulmonology at the [facility], he has not been seen for quite some time.  Otherwise  patient is stable and agreeable for discharge home.  Lengthy discussion with family daily about goals of care, it appears patient does not want to be discharged to a facility nor does he want to be readmitted to the hospital, I think palliative care is certainly appropriate and have asked the family to follow-up closely through their PCP in the outpatient setting.  Discharge Diagnoses:  Principal Problem:   Acute on chronic respiratory failure with hypoxia (HCC) Active Problems:   Chronic diastolic heart failure (HCC)   COPD (chronic obstructive pulmonary disease) (HCC)   Stage 3b chronic kidney disease (CKD) (HCC)   Hypertension   Hyperlipidemia   CAD (coronary artery disease)   Diabetes mellitus type 2 without retinopathy (HCC)   Mixed Alzheimer's and vascular dementia ([place])   Pleural effusion due to CHF (congestive heart failure) (HCC)   SVT (supraventricular tachycardia)   COPD exacerbation (HCC)  Acute on chronic respiratory failure with hypoxia Presumed COPD flare -Unable to effectively wean oxygen, continues on 6 L nasal cannula around-the-clock, but stable without dyspnea or symptoms even with prolonged exertion - Unclear provoking event, presumed COPD exacerbation as above given negative PE chest x-ray and CT scans. -Continue prolonged steroid taper - Continue incentive spirometry, no antibiotics indicated at this time -Continue Anoro, as needed nebs/bronchodilators -Anxiety likely playing a role in patient's respiratory status as well   AKI on CKD 3B -increase p.o. intake as tolerated, likely prerenal, follow repeat labs Chronic diastolic heart failure euvolemic, continue enalapril Hypertension -well-controlled on current regimen Hyperlipidemia -continue statin Diabetes type 2, controlled -diet controlled; monitor closely while on steroids Mixed Alzheimer's and vascular dementia-continue Lexapro, Namenda  Discharge Instructions  Allergies as of 05/12/2022        Reactions   Meperidine Hcl Swelling, Other (See Comments)   Tongue swelling Because of a history of documented adverse serious drug reaction, Medi Alert bracelet  is recommended.   Sertraline Shortness Of Breath, Other (See Comments)   Burning sensation from feet to head, diff breathing   Gabapentin Other (See Comments)   Confusion   Memantine Other (See Comments)   Can take only up to 5 mg a day- causes lethargy past that amount   Latex Rash, Other (See Comments)   Minor rash   Zolpidem Tartrate Other (See Comments)   Nightmares        Medication List     TAKE these medications    ACCU-CHEK COMPACT STRIPS test strip Generic drug: glucose blood TEST BLOOD SUGAR ONCE DAILY   albuterol 108 (90 Base) MCG/ACT inhaler Commonly known as: VENTOLIN HFA Inhale 2 puffs into the lungs every 6 (six) hours as needed for wheezing or shortness of breath.   Anoro Ellipta 62.5-25 MCG/ACT Aepb Generic drug: umeclidinium-vilanterol USE 1 INHALATION DAILY What changed: See the new instructions.   Aspirin 81 MG EC tablet Take 81 mg by mouth daily.   enalapril 10 MG tablet Commonly known as: VASOTEC Take 1 tablet (10 mg total) by mouth daily.   escitalopram 5 MG tablet Commonly known as: LEXAPRO Take 1 tablet (5 mg total) by mouth daily. Start taking on: May 13, 2022 What changed:  how much to take when to take this   ferrous sulfate 325 (65 FE) MG tablet Take 1 tablet (325 mg total) by mouth daily with breakfast.   furosemide 40 MG tablet Commonly known as: LASIX Take 1 tablet (40 mg total) by mouth daily. What changed: when to take this   Magnesium Oxide 420 MG Tabs Take 420 mg by mouth daily.   memantine 10 MG tablet Commonly known as: NAMENDA Take 1 tablet (10 mg) twice a day What changed:  how much to take how to take this when to take this additional instructions   mirtazapine 15 MG tablet Commonly known as: REMERON TAKE 1 TABLET(15 MG) BY MOUTH AT  BEDTIME What changed: See the new instructions.   OXYGEN Inhale 5 L/min into the lungs continuous.   predniSONE 10 MG tablet Commonly known as: DELTASONE Take 2 tablets (20 mg total) by mouth 2 (two) times daily with a meal for 7 days, THEN 1 tablet (10 mg total) 2 (two) times daily with a meal for 7 days, THEN 1 tablet (10 mg total) daily with breakfast for 7 days. Start taking on: May 12, 2022   simvastatin 40 MG tablet Commonly known as: ZOCOR TAKE 1 TABLET DAILY   Systane Complete PF 0.6 % Soln Generic drug: Propylene Glycol (PF) Place 1 drop into both eyes 4 (four) times daily.   triamcinolone cream 0.1 % Commonly known as: KENALOG Apply 1 application  topically daily as needed (for dryness/itching (affected areas)).   VITAMIN B12 PO Take 1 tablet by mouth daily.   Vitamin D3 50 MCG (2000 UT) Tabs Take 2,000 Units by mouth daily.               Durable Medical Equipment  (From admission, onward)           Start     Ordered   05/12/22 1208  For home use only DME oxygen  Once       Question Answer Comment  Length of  Need Lifetime   Mode or (Route) Nasal cannula   Liters per Minute 6   Frequency Continuous (stationary and portable oxygen unit needed)   Oxygen conserving device Yes   Oxygen delivery system Gas      05/12/22 1208            Allergies  Allergen Reactions   Meperidine Hcl Swelling and Other (See Comments)    Tongue swelling Because of a history of documented adverse serious drug reaction, Medi Alert bracelet  is recommended.   Sertraline Shortness Of Breath and Other (See Comments)    Burning sensation from feet to head, diff breathing   Gabapentin Other (See Comments)    Confusion    Memantine Other (See Comments)    Can take only up to 5 mg a day- causes lethargy past that amount   Latex Rash and Other (See Comments)    Minor rash   Zolpidem Tartrate Other (See Comments)    Nightmares     Consultations: None  Procedures/Studies: CT Angio Chest PE W/Cm &/Or Wo Cm  Result Date: 05/08/2022 CLINICAL DATA:  Shortness of breath. EXAM: CT ANGIOGRAPHY CHEST WITH CONTRAST TECHNIQUE: Multidetector CT imaging of the chest was performed using the standard protocol during bolus administration of intravenous contrast. Multiplanar CT image reconstructions and MIPs were obtained to evaluate the vascular anatomy. RADIATION DOSE REDUCTION: This exam was performed according to the departmental dose-optimization program which includes automated exposure control, adjustment of the mA and/or kV according to patient size and/or use of iterative reconstruction technique. CONTRAST:  78mL OMNIPAQUE IOHEXOL 350 MG/ML SOLN COMPARISON:  CT scan 09/12/2021 FINDINGS: Cardiovascular: The heart is normal in size. No pericardial effusion. Stable tortuosity and calcification of the thoracic aorta but no focal aneurysm or dissection. Three-vessel coronary artery calcifications and surgical changes from coronary artery bypass surgery. Enlarged pulmonary artery suggesting pulmonary hypertension. No filling defects to suggest pulmonary embolism. Mediastinum/Nodes: Scattered borderline mediastinal and hilar lymph nodes appears stable and are likely reactive. No mass or overt adenopathy. The esophagus is grossly. Lungs/Pleura: Chronic small to moderate-sized right pleural effusion with overlying atelectasis. Stable significant underlying emphysematous changes and pulmonary scarring. Dense band of right upper lobe radiation fibrosis with a medial nodular component measuring 10 mm which appears stable. Findings consistent treated neoplasm. No findings suspicious for recurrent tumor. Stable significant underlying emphysematous changes. Small left upper lobe pulmonary nodule measures 6 mm and is unchanged since 2021 and considered benign. Upper Abdomen: No significant upper abdominal findings. Stable aortoiliac stent graft.  Musculoskeletal: The bony thorax is intact. Review of the MIP images confirms the above findings. IMPRESSION: 1. No CT findings for pulmonary embolism. 2. Enlarged pulmonary arteries suggesting pulmonary hypertension. 3. Chronic small to moderate-sized right pleural effusion with overlying atelectasis. 4. Stable right upper lobe radiation fibrosis with a medial nodular component measuring 10 mm. No findings suspicious for recurrent tumor. 5. Stable 6 mm left upper lobe pulmonary nodule since 2021 and considered benign. 6. Stable borderline mediastinal and hilar lymph nodes, likely reactive. 7. Stable aortoiliac stent graft. Aortic Atherosclerosis (ICD10-I70.0) and Emphysema (ICD10-J43.9). Electronically Signed   By: Marijo Sanes M.D.   On: 05/08/2022 16:36   DG Chest 2 View  Result Date: 05/08/2022 CLINICAL DATA:  Shortness of breath for 1 day. On home oxygen. History of COPD. EXAM: CHEST - 2 VIEW COMPARISON:  CT 01/10/2022.  Radiographs 09/27/2021 and 09/12/2021. FINDINGS: The heart size and mediastinal contours are stable status post median sternotomy and CABG. There  is aortic atherosclerosis. The lungs are hyperinflated. Chronic right pleural effusion and linear scarring in the right lung are unchanged. There are mildly increased interstitial markings at the left lung base without consolidation or significant left pleural effusion. No pneumothorax. The bones appear unchanged. Abdominal aortic stent graft noted. IMPRESSION: Mildly increased interstitial markings at the left lung base which could reflect mild interstitial edema or atypical infection. Chronic right pleural effusion and scarring. Electronically Signed   By: Richardean Sale M.D.   On: 05/08/2022 14:09     Subjective: No acute issues or events overnight, patient indicates he feels markedly improved, walking 500+ feet today with staff without any symptoms although mild hypoxia was noted on pulse ox.  Requesting discharge home which at this time  is certainly reasonable given his improvement in symptoms as this is likely his new baseline.   Discharge Exam: Vitals:   05/12/22 0730 05/12/22 1041  BP:    Pulse:    Resp:    Temp:    SpO2: 99% (!) 88%   Vitals:   05/11/22 2055 05/12/22 0440 05/12/22 0730 05/12/22 1041  BP: (!) 165/74 (!) 158/60    Pulse: 70 64    Resp: 18     Temp: 97.7 F (36.5 C) 98.4 F (36.9 C)    TempSrc: Oral Oral    SpO2: 99% 98% 99% (!) 88%  Weight:      Height:       General: Pt is alert, awake, not in acute distress Cardiovascular: RRR, S1/S2 +, no rubs, no gallops Respiratory: CTA bilaterally, no wheezing, no rhonchi Abdominal: Soft, NT, ND, bowel sounds + Extremities: no edema, no cyanosis  The results of significant diagnostics from this hospitalization (including imaging, microbiology, ancillary and laboratory) are listed below for reference.     Microbiology: Recent Results (from the past 240 hour(s))  Resp panel by RT-PCR (RSV, Flu A&B, Covid) Anterior Nasal Swab     Status: None   Collection Time: 05/08/22  4:55 PM   Specimen: Anterior Nasal Swab  Result Value Ref Range Status   SARS Coronavirus 2 by RT PCR NEGATIVE NEGATIVE Final    Comment: (NOTE) SARS-CoV-2 target nucleic acids are NOT DETECTED.  The SARS-CoV-2 RNA is generally detectable in upper respiratory specimens during the acute phase of infection. The lowest concentration of SARS-CoV-2 viral copies this assay can detect is 138 copies/mL. A negative result does not preclude SARS-Cov-2 infection and should not be used as the sole basis for treatment or other patient management decisions. A negative result may occur with  improper specimen collection/handling, submission of specimen other than nasopharyngeal swab, presence of viral mutation(s) within the areas targeted by this assay, and inadequate number of viral copies(<138 copies/mL). A negative result must be combined with clinical observations, patient history,  and epidemiological information. The expected result is Negative.  Fact Sheet for Patients:  EntrepreneurPulse.com.au  Fact Sheet for Healthcare Providers:  IncredibleEmployment.be  This test is no t yet approved or cleared by the [place] FDA and  has been authorized for detection and/or diagnosis of SARS-CoV-2 by FDA under an Emergency Use Authorization (EUA). This EUA will remain  in effect (meaning this test can be used) for the duration of the COVID-19 declaration under Section 564(b)(1) of the Act, 21 U.S.C.section 360bbb-3(b)(1), unless the authorization is terminated  or revoked sooner.       Influenza A by PCR NEGATIVE NEGATIVE Final   Influenza B by PCR NEGATIVE NEGATIVE Final  Comment: (NOTE) The Xpert Xpress SARS-CoV-2/FLU/RSV plus assay is intended as an aid in the diagnosis of influenza from Nasopharyngeal swab specimens and should not be used as a sole basis for treatment. Nasal washings and aspirates are unacceptable for Xpert Xpress SARS-CoV-2/FLU/RSV testing.  Fact Sheet for Patients: EntrepreneurPulse.com.au  Fact Sheet for Healthcare Providers: IncredibleEmployment.be  This test is not yet approved or cleared by the [place] FDA and has been authorized for detection and/or diagnosis of SARS-CoV-2 by FDA under an Emergency Use Authorization (EUA). This EUA will remain in effect (meaning this test can be used) for the duration of the COVID-19 declaration under Section 564(b)(1) of the Act, 21 U.S.C. section 360bbb-3(b)(1), unless the authorization is terminated or revoked.     Resp Syncytial Virus by PCR NEGATIVE NEGATIVE Final    Comment: (NOTE) Fact Sheet for Patients: EntrepreneurPulse.com.au  Fact Sheet for Healthcare Providers: IncredibleEmployment.be  This test is not yet approved or cleared by the [place] FDA and has  been authorized for detection and/or diagnosis of SARS-CoV-2 by FDA under an Emergency Use Authorization (EUA). This EUA will remain in effect (meaning this test can be used) for the duration of the COVID-19 declaration under Section 564(b)(1) of the Act, 21 U.S.C. section 360bbb-3(b)(1), unless the authorization is terminated or revoked.  Performed at [facility], [place] [address], [place], [place] [address]      Labs: BNP (last 3 results) Recent Labs    09/12/21 1509 05/08/22 1335  BNP 690.9* [id number]*   Basic Metabolic Panel: Recent Labs  Lab 05/08/22 1335 05/09/22 0526 05/10/22 0502 05/11/22 0531 05/12/22 0452  NA 140 140 136 134* 135  K 4.8 4.6 5.1 4.9 4.8  CL 105 104 103 102 101  CO2 25 28 27 24 26   GLUCOSE 219* 117* 148* 184* 161*  BUN 32* 33* 39* 51* 55*  CREATININE 1.74* 1.70* 2.09* 2.45* 2.07*  CALCIUM 8.6* 8.4* 8.2* 7.9* 8.1*   Liver Function Tests: Recent Labs  Lab 05/08/22 1335  AST 18  ALT 12  ALKPHOS 56  BILITOT 0.9  PROT 7.5  ALBUMIN 3.9   No results for input(s): "LIPASE", "AMYLASE" in the last 168 hours. No results for input(s): "AMMONIA" in the last 168 hours. CBC: Recent Labs  Lab 05/08/22 1335 05/09/22 0526 05/10/22 0502 05/11/22 0531 05/12/22 0452  WBC 5.4 4.9 5.0 6.1 7.3  NEUTROABS 3.9  --   --   --   --   HGB 10.4* 8.6* 9.3* 8.9* 9.5*  HCT 34.3* 28.7* 30.4* 29.1* 30.2*  MCV 93.0 93.8 92.4 92.4 91.8  PLT 172 144* 175 169 211   Cardiac Enzymes: No results for input(s): "CKTOTAL", "CKMB", "CKMBINDEX", "TROPONINI" in the last 168 hours. BNP: Invalid input(s): "POCBNP" CBG: No results for input(s): "GLUCAP" in the last 168 hours. D-Dimer No results for input(s): "DDIMER" in the last 72 hours. Hgb A1c No results for input(s): "HGBA1C" in the last 72 hours. Lipid Profile No results for input(s): "CHOL", "HDL", "LDLCALC", "TRIG", "CHOLHDL", "LDLDIRECT" in the last 72 hours. Thyroid function studies No results  for input(s): "TSH", "T4TOTAL", "T3FREE", "THYROIDAB" in the last 72 hours.  Invalid input(s): "FREET3" Anemia work up No results for input(s): "VITAMINB12", "FOLATE", "FERRITIN", "TIBC", "IRON", "RETICCTPCT" in the last 72 hours. Urinalysis    Component Value Date/Time   COLORURINE STRAW (A) 04/02/2022 1729   APPEARANCEUR CLEAR 04/02/2022 1729   LABSPEC 1.015 04/02/2022 1729   PHURINE 7.0 04/02/2022 1729   GLUCOSEU NEGATIVE 04/02/2022  Tony 04/02/2022 1729   BILIRUBINUR NEGATIVE 04/02/2022 1729   KETONESUR NEGATIVE 04/02/2022 1729   PROTEINUR 30 (A) 04/02/2022 1729   UROBILINOGEN 1.0 03/02/2010 2320   NITRITE NEGATIVE 04/02/2022 1729   LEUKOCYTESUR NEGATIVE 04/02/2022 1729   Sepsis Labs Recent Labs  Lab 05/09/22 0526 05/10/22 0502 05/11/22 0531 05/12/22 0452  WBC 4.9 5.0 6.1 7.3   Microbiology Recent Results (from the past 240 hour(s))  Resp panel by RT-PCR (RSV, Flu A&B, Covid) Anterior Nasal Swab     Status: None   Collection Time: 05/08/22  4:55 PM   Specimen: Anterior Nasal Swab  Result Value Ref Range Status   SARS Coronavirus 2 by RT PCR NEGATIVE NEGATIVE Final    Comment: (NOTE) SARS-CoV-2 target nucleic acids are NOT DETECTED.  The SARS-CoV-2 RNA is generally detectable in upper respiratory specimens during the acute phase of infection. The lowest concentration of SARS-CoV-2 viral copies this assay can detect is 138 copies/mL. A negative result does not preclude SARS-Cov-2 infection and should not be used as the sole basis for treatment or other patient management decisions. A negative result may occur with  improper specimen collection/handling, submission of specimen other than nasopharyngeal swab, presence of viral mutation(s) within the areas targeted by this assay, and inadequate number of viral copies(<138 copies/mL). A negative result must be combined with clinical observations, patient history, and epidemiological information. The  expected result is Negative.  Fact Sheet for Patients:  EntrepreneurPulse.com.au  Fact Sheet for Healthcare Providers:  IncredibleEmployment.be  This test is no t yet approved or cleared by the [place] FDA and  has been authorized for detection and/or diagnosis of SARS-CoV-2 by FDA under an Emergency Use Authorization (EUA). This EUA will remain  in effect (meaning this test can be used) for the duration of the COVID-19 declaration under Section 564(b)(1) of the Act, 21 U.S.C.section 360bbb-3(b)(1), unless the authorization is terminated  or revoked sooner.       Influenza A by PCR NEGATIVE NEGATIVE Final   Influenza B by PCR NEGATIVE NEGATIVE Final    Comment: (NOTE) The Xpert Xpress SARS-CoV-2/FLU/RSV plus assay is intended as an aid in the diagnosis of influenza from Nasopharyngeal swab specimens and should not be used as a sole basis for treatment. Nasal washings and aspirates are unacceptable for Xpert Xpress SARS-CoV-2/FLU/RSV testing.  Fact Sheet for Patients: EntrepreneurPulse.com.au  Fact Sheet for Healthcare Providers: IncredibleEmployment.be  This test is not yet approved or cleared by the [place] FDA and has been authorized for detection and/or diagnosis of SARS-CoV-2 by FDA under an Emergency Use Authorization (EUA). This EUA will remain in effect (meaning this test can be used) for the duration of the COVID-19 declaration under Section 564(b)(1) of the Act, 21 U.S.C. section 360bbb-3(b)(1), unless the authorization is terminated or revoked.     Resp Syncytial Virus by PCR NEGATIVE NEGATIVE Final    Comment: (NOTE) Fact Sheet for Patients: EntrepreneurPulse.com.au  Fact Sheet for Healthcare Providers: IncredibleEmployment.be  This test is not yet approved or cleared by the [place] FDA and has been authorized for detection and/or  diagnosis of SARS-CoV-2 by FDA under an Emergency Use Authorization (EUA). This EUA will remain in effect (meaning this test can be used) for the duration of the COVID-19 declaration under Section 564(b)(1) of the Act, 21 U.S.C. section 360bbb-3(b)(1), unless the authorization is terminated or revoked.  Performed at [facility], [place] [address], [place],  [address]  Time coordinating discharge: Over 30 minutes  SIGNED:   [name], DO [facility] 05/12/2022, 12:11 PM Pager   If 7PM-7AM, please contact night-coverage [email or web address]

## 2022-05-11 NOTE — Progress Notes (Signed)
  Transition of Care [facility]) Screening Note   Patient Details  Name: Brian [name] Date of Birth: 1931-04-12   Transition of Care [facility]) CM/SW Contact:    Dessa Phi, RN Phone Number: 05/11/2022, 3:55 PM    Transition of Care Department [facility]) has reviewed patient and no TOC needs have been identified at this time. We will continue to monitor patient advancement through interdisciplinary progression rounds. If new patient transition needs arise, please place a TOC consult.

## 2022-05-12 LAB — CBC
HCT: 30.2 % — ABNORMAL LOW (ref 39.0–52.0)
Hemoglobin: 9.5 g/dL — ABNORMAL LOW (ref 13.0–17.0)
MCH: 28.9 pg (ref 26.0–34.0)
MCHC: 31.5 g/dL (ref 30.0–36.0)
MCV: 91.8 fL (ref 80.0–100.0)
Platelets: 211 10*3/uL (ref 150–400)
RBC: 3.29 MIL/uL — ABNORMAL LOW (ref 4.22–5.81)
RDW: 16.9 % — ABNORMAL HIGH (ref 11.5–15.5)
WBC: 7.3 10*3/uL (ref 4.0–10.5)
nRBC: 0 % (ref 0.0–0.2)

## 2022-05-12 LAB — BASIC METABOLIC PANEL
Anion gap: 8 (ref 5–15)
BUN: 55 mg/dL — ABNORMAL HIGH (ref 8–23)
CO2: 26 mmol/L (ref 22–32)
Calcium: 8.1 mg/dL — ABNORMAL LOW (ref 8.9–10.3)
Chloride: 101 mmol/L (ref 98–111)
Creatinine, Ser: 2.07 mg/dL — ABNORMAL HIGH (ref 0.61–1.24)
GFR, Estimated: 30 mL/min — ABNORMAL LOW (ref >60)
Glucose, Bld: 161 mg/dL — ABNORMAL HIGH (ref 70–99)
Potassium: 4.8 mmol/L (ref 3.5–5.1)
Sodium: 135 mmol/L (ref 135–145)

## 2022-05-12 MED ORDER — ESCITALOPRAM OXALATE 5 MG PO TABS: 5.0000 mg | ORAL_TABLET | Freq: Every day | ORAL | 0 refills | Status: DC

## 2022-05-12 MED ORDER — PREDNISONE 10 MG PO TABS: ORAL_TABLET | ORAL | 0 refills | Status: AC

## 2022-05-12 NOTE — TOC Transition Note (Signed)
Transition of Care [facility]) - CM/SW Discharge Note   Patient Details  Name: Brian [name] MRN: [id number] Date of Birth: 01/19/1932  Transition of Care [facility] CM/SW Contact:  Henrietta Dine, RN Phone Number: 05/12/2022, 12:39 PM   Clinical Narrative:    [facility] consult for home oxygen; pt has [employer] for home oxygen; flow increased to 6L; pt's dtr says his travel tank and  home equipment goes to 5 L; contacted Kristin at [facility] and travel tank will be delivered to room; pt and dtr [name] notified and agree to plan; no TOC needs.   Final next level of care: Home/Self Care Barriers to Discharge: No Barriers Identified   Patient Goals and CMS Choice      Discharge Placement                         Discharge Plan and Services Additional resources added to the After Visit Summary for                  DME Arranged: Oxygen (previous home oxygen w/ [employer]; flow increased to 6 L) DME Agency: [employer] Date DME Agency Contacted: 05/12/22 Time DME Agency [id number] Representative spoke with at DME Agency: Erasmo Downer            Social Determinants of Health ([facility]) Interventions SDOH Screenings   Food Insecurity: No Food Insecurity (05/09/2022)  Housing: Low Risk  (05/09/2022)  Transportation Needs: No Transportation Needs (05/09/2022)  Utilities: Not At Risk (05/09/2022)  Alcohol Screen: Low Risk  (08/24/2021)  Depression (PHQ2-9): Low Risk  (04/10/2022)  Financial Resource Strain: Low Risk  (08/24/2021)  Physical Activity: Inactive (08/24/2021)  Social Connections: Moderately Integrated (08/24/2021)  Stress: No Stress Concern Present (08/24/2021)  Tobacco Use: Medium Risk (05/09/2022)     Readmission Risk Interventions     No data to display

## 2022-05-12 NOTE — Progress Notes (Signed)
Mobility Specialist - Progress Note   05/12/22 1041  Oxygen Therapy  SpO2 (!) 88 %  O2 Device Nasal Cannula  O2 Flow Rate (L/min) 6 L/min  Patient Activity (if Appropriate) Ambulating  Mobility  Activity Ambulated independently in hallway;Ambulated independently to bathroom  Level of Assistance Independent  Assistive Device None  Distance Ambulated (ft) 500 ft  Activity Response Tolerated well  Mobility Referral Yes  $Mobility charge 1 Mobility   Nurse requested Mobility Specialist to perform oxygen saturation test with pt which includes removing pt from oxygen both at rest and while ambulating.  Below are the results from that testing.     Patient Saturations on Room Air at Rest = spO2 86% on 4L [place]  Patient Saturations on Room Air while Ambulating = sp02 88% .  Rested and performed pursed lip breathing for 5 minutes with sp02 at 91%.  Patient Saturations on 6 Liters of oxygen while Ambulating = sp02 88%  At end of testing pt left in room on 6  Liters of oxygen.  Reported results to nurse.   [facility]

## 2022-05-12 NOTE — Progress Notes (Signed)
PROGRESS NOTE       KAJ TODOROFF          [id number] DOB: 10-Dec-1931 DOA: 05/08/2022 PCP: Binnie Rail, MD     Brief Narrative:  Brian [name] is a 89 y.o. male with a complex medical history significant for coronary artery disease, status post CABG, COPD on 2 to 3 L nasal cannula oxygen chronically, diet-controlled type 2 diabetes.  Hypertension, hyperlipidemia, chronic pleural effusion who had a recent stay in this facility for anemia/GI bleed who now presents to the emergency department with complaints of shortness of breath. Worsening fatigue, dyspnea on exertion, limited to 3-4 steps before needing to rest. Found to be profoundly hypoxic at home (70%).   Assessment & Plan:   Principal Problem:   Acute on chronic respiratory failure with hypoxia (HCC) Active Problems:   Chronic diastolic heart failure (HCC)   COPD (chronic obstructive pulmonary disease) (HCC)   Stage 3b chronic kidney disease (CKD) (HCC)   Hypertension   Hyperlipidemia   CAD (coronary artery disease)   Diabetes mellitus type 2 without retinopathy (HCC)   Mixed Alzheimer's and vascular dementia (HCC)   Pleural effusion due to CHF (congestive heart failure) (HCC)   SVT (supraventricular tachycardia)   Acute on chronic respiratory failure with hypoxia Presumed COPD flare - Continue to wean oxygen as tolerated - Unclear provoking event, presumed COPD exacerbation as above given negative PE chest x-ray and CT scans. -Start low-dose steroids, titrate down as appropriate - Continue incentive spirometry, no antibiotics indicated at this time -Continue Anoro, as needed nebs/bronchodilators -Anxiety likely playing a role in patient's respiratory status as well, continue low-dose Ativan, use sparingly given patient's age -Patient remains markedly hypoxic with exertion although appears to be close to his baseline at rest   AKI on CKD 3B -increase p.o. intake, likely prerenal, follow repeat labs     Chronic  diastolic heart failure euvolemic, continue enalapril Hypertension -well-controlled on current regimen Hyperlipidemia -continue statin Diabetes type 2, controlled -diet controlled; monitor closely while on steroids Mixed Alzheimer's and vascular dementia-continue Lexapro, Namenda   Goals of care -lengthy discussion with daughter today about goals of care given patient's age comorbidities.  Apparently patient does have a living will which the family will attempt to make available for the hospital system so he can be scanned into records.  We also discussed possible need for palliative care moving forward given his multiple issues.  They are also agreeable to follow-up with palliative care in the future through the [place].   DVT prophylaxis: Lovenox Code Status: Full Family Communication: Daughter updated at bedside   Status is: Inpatient   Dispo: The patient is from: Home              Anticipated d/c is to: To be determined              Anticipated d/c date is: 48 to 72 hours              Patient currently not medically stable for discharge   Consultants:  None   Procedures:  None   Antimicrobials:  None indicated   Subjective: No acute issues or events overnight, patient appears to be much more energetic today.  States he feels much better but continues to be dyspneic with exertion.  Denies chest pain nausea vomiting diarrhea constipation headache fevers or chills   Objective:       Vitals:    05/09/22 1329 05/09/22 2152 05/10/22 0041 05/10/22  0512  BP: (!) 151/68 (!) 156/70   (!) 147/59  Pulse: 62 69   67  Resp: 18 20 20     Temp: 97.7 F (36.5 C) 97.9 F (36.6 C)   97.7 F (36.5 C)  TempSrc:   Oral   Oral  SpO2: 96% 96% 96% 97%  Weight:          Height:              Intake/Output Summary (Last 24 hours) at 05/10/2022 0737 Last data filed at 05/09/2022 2150    Gross per 24 hour  Intake 970 ml  Output 225 ml  Net 745 ml          Filed Weights    05/08/22 1322  05/08/22 1926  Weight: 67 kg 65 kg      Examination: General: Pleasantly resting in bed, No acute distress. HEENT: Normocephalic atraumatic.  Sclerae nonicteric, noninjected.  Extraocular movements intact bilaterally. Neck: Without mass or deformity.  Trachea is midline. Lungs: Scant end expiratory wheeze right greater than left. Heart: Regular rate and rhythm.  Without murmurs, rubs, or gallops. Abdomen: Soft, nontender, nondistended.  Without guarding or rebound. Extremities: Without cyanosis, clubbing, edema, or obvious deformity. Skin:  Warm and dry, no erythema   Data Reviewed: I have personally reviewed following labs and imaging studies   CBC: Last Labs       Recent Labs  Lab 05/08/22 1335 05/09/22 0526 05/10/22 0502  WBC 5.4 4.9 5.0  NEUTROABS 3.9  --   --   HGB 10.4* 8.6* 9.3*  HCT 34.3* 28.7* 30.4*  MCV 93.0 93.8 92.4  PLT 172 144* 175       Basic Metabolic Panel: Last Labs       Recent Labs  Lab 05/08/22 1335 05/09/22 0526 05/10/22 0502  NA 140 140 136  K 4.8 4.6 5.1  CL 105 104 103  CO2 25 28 27   GLUCOSE 219* 117* 148*  BUN 32* 33* 39*  CREATININE 1.74* 1.70* 2.09*  CALCIUM 8.6* 8.4* 8.2*       GFR: Estimated Creatinine Clearance: 22.5 mL/min (A) (by C-G formula based on SCr of 2.09 mg/dL (H)).   Liver Function Tests: Last Labs     Recent Labs  Lab 05/08/22 1335  AST 18  ALT 12  ALKPHOS 56  BILITOT 0.9  PROT 7.5  ALBUMIN 3.9              Recent Results (from the past 240 hour(s))  Resp panel by RT-PCR (RSV, Flu A&B, Covid) Anterior Nasal Swab     Status: None    Collection Time: 05/08/22  4:55 PM    Specimen: Anterior Nasal Swab  Result Value Ref Range Status    SARS Coronavirus 2 by RT PCR NEGATIVE NEGATIVE Final      Comment: (NOTE) SARS-CoV-2 target nucleic acids are NOT DETECTED.   The SARS-CoV-2 RNA is generally detectable in upper respiratory specimens during the acute phase of infection. The lowest concentration of  SARS-CoV-2 viral copies this assay can detect is 138 copies/mL. A negative result does not preclude SARS-Cov-2 infection and should not be used as the sole basis for treatment or other patient management decisions. A negative result may occur with  improper specimen collection/handling, submission of specimen other than nasopharyngeal swab, presence of viral mutation(s) within the areas targeted by this assay, and inadequate number of viral copies(<138 copies/mL). A negative result must be combined with clinical observations, patient history, and  epidemiological information. The expected result is Negative.   Fact Sheet for Patients:  EntrepreneurPulse.com.au   Fact Sheet for Healthcare Providers:  IncredibleEmployment.be   This test is no t yet approved or cleared by the [place] FDA and  has been authorized for detection and/or diagnosis of SARS-CoV-2 by FDA under an Emergency Use Authorization (EUA). This EUA will remain  in effect (meaning this test can be used) for the duration of the COVID-19 declaration under Section 564(b)(1) of the Act, 21 U.S.C.section 360bbb-3(b)(1), unless the authorization is terminated  or revoked sooner.           Influenza A by PCR NEGATIVE NEGATIVE Final    Influenza B by PCR NEGATIVE NEGATIVE Final      Comment: (NOTE) The Xpert Xpress SARS-CoV-2/FLU/RSV plus assay is intended as an aid in the diagnosis of influenza from Nasopharyngeal swab specimens and should not be used as a sole basis for treatment. Nasal washings and aspirates are unacceptable for Xpert Xpress SARS-CoV-2/FLU/RSV testing.   Fact Sheet for Patients: EntrepreneurPulse.com.au   Fact Sheet for Healthcare Providers: IncredibleEmployment.be   This test is not yet approved or cleared by the [place] FDA and has been authorized for detection and/or diagnosis of SARS-CoV-2 by FDA under an Emergency  Use Authorization (EUA). This EUA will remain in effect (meaning this test can be used) for the duration of the COVID-19 declaration under Section 564(b)(1) of the Act, 21 U.S.C. section 360bbb-3(b)(1), unless the authorization is terminated or revoked.        Resp Syncytial Virus by PCR NEGATIVE NEGATIVE Final      Comment: (NOTE) Fact Sheet for Patients: EntrepreneurPulse.com.au   Fact Sheet for Healthcare Providers: IncredibleEmployment.be   This test is not yet approved or cleared by the [place] FDA and has been authorized for detection and/or diagnosis of SARS-CoV-2 by FDA under an Emergency Use Authorization (EUA). This EUA will remain in effect (meaning this test can be used) for the duration of the COVID-19 declaration under Section 564(b)(1) of the Act, 21 U.S.C. section 360bbb-3(b)(1), unless the authorization is terminated or revoked.   Performed at [facility] [place] [address], [place], [place] [address]      Radiology Studies:  Imaging Results (Last 48 hours)  CT Angio Chest PE W/Cm &/Or Wo Cm   Result Date: 05/08/2022 CLINICAL DATA:  Shortness of breath. EXAM: CT ANGIOGRAPHY CHEST WITH CONTRAST TECHNIQUE: Multidetector CT imaging of the chest was performed using the standard protocol during bolus administration of intravenous contrast. Multiplanar CT image reconstructions and MIPs were obtained to evaluate the vascular anatomy. RADIATION DOSE REDUCTION: This exam was performed according to the departmental dose-optimization program which includes automated exposure control, adjustment of the mA and/or kV according to patient size and/or use of iterative reconstruction technique. CONTRAST:  61mL OMNIPAQUE IOHEXOL 350 MG/ML SOLN COMPARISON:  CT scan 09/12/2021 FINDINGS: Cardiovascular: The heart is normal in size. No pericardial effusion. Stable tortuosity and calcification of the thoracic aorta but no focal  aneurysm or dissection. Three-vessel coronary artery calcifications and surgical changes from coronary artery bypass surgery. Enlarged pulmonary artery suggesting pulmonary hypertension. No filling defects to suggest pulmonary embolism. Mediastinum/Nodes: Scattered borderline mediastinal and hilar lymph nodes appears stable and are likely reactive. No mass or overt adenopathy. The esophagus is grossly. Lungs/Pleura: Chronic small to moderate-sized right pleural effusion with overlying atelectasis. Stable significant underlying emphysematous changes and pulmonary scarring. Dense band of right upper lobe radiation fibrosis with a medial nodular  component measuring 10 mm which appears stable. Findings consistent treated neoplasm. No findings suspicious for recurrent tumor. Stable significant underlying emphysematous changes. Small left upper lobe pulmonary nodule measures 6 mm and is unchanged since 2021 and considered benign. Upper Abdomen: No significant upper abdominal findings. Stable aortoiliac stent graft. Musculoskeletal: The bony thorax is intact. Review of the MIP images confirms the above findings. IMPRESSION: 1. No CT findings for pulmonary embolism. 2. Enlarged pulmonary arteries suggesting pulmonary hypertension. 3. Chronic small to moderate-sized right pleural effusion with overlying atelectasis. 4. Stable right upper lobe radiation fibrosis with a medial nodular component measuring 10 mm. No findings suspicious for recurrent tumor. 5. Stable 6 mm left upper lobe pulmonary nodule since 2021 and considered benign. 6. Stable borderline mediastinal and hilar lymph nodes, likely reactive. 7. Stable aortoiliac stent graft. Aortic Atherosclerosis (ICD10-I70.0) and Emphysema (ICD10-J43.9). Electronically Signed   By: Marijo Sanes M.D.   On: 05/08/2022 16:36    DG Chest 2 View   Result Date: 05/08/2022 CLINICAL DATA:  Shortness of breath for 1 day. On home oxygen. History of COPD. EXAM: CHEST - 2 VIEW  COMPARISON:  CT 01/10/2022.  Radiographs 09/27/2021 and 09/12/2021. FINDINGS: The heart size and mediastinal contours are stable status post median sternotomy and CABG. There is aortic atherosclerosis. The lungs are hyperinflated. Chronic right pleural effusion and linear scarring in the right lung are unchanged. There are mildly increased interstitial markings at the left lung base without consolidation or significant left pleural effusion. No pneumothorax. The bones appear unchanged. Abdominal aortic stent graft noted. IMPRESSION: Mildly increased interstitial markings at the left lung base which could reflect mild interstitial edema or atypical infection. Chronic right pleural effusion and scarring. Electronically Signed   By: Richardean Sale M.D.   On: 05/08/2022 14:09       Scheduled Meds:  enalapril  10 mg Oral Daily   enoxaparin (LOVENOX) injection  30 mg Subcutaneous Q24H   escitalopram  5 mg Oral Daily   memantine  5 mg Oral Daily   mirtazapine  15 mg Oral QHS   predniSONE  20 mg Oral BID WC   simvastatin  40 mg Oral Daily   umeclidinium-vilanterol  1 puff Inhalation Daily    Continuous Infusions:    LOS: 0 days    Time spent: 16min   Farrel Guimond C Otilio Groleau, DO [facility]   If 7PM-7AM, please contact night-coverage [email or web address]

## 2022-05-14 ENCOUNTER — Encounter: Payer: Self-pay | Admitting: *Deleted

## 2022-05-14 ENCOUNTER — Telehealth: Payer: Self-pay | Admitting: *Deleted

## 2022-05-14 NOTE — Transitions of Care (Post Inpatient/ED Visit) (Signed)
   05/14/2022  Name: Brian [name] MRN: [id number] DOB: [birth year]  Today's TOC FU Call Status: Today's TOC FU Call Status:: Unsuccessul Call (1st Attempt) Unsuccessful Call (1st Attempt) Date: 05/14/22  Attempted to reach the patient regarding the most recent Inpatient visit; person answering phone answers and immediately states "You need to call back later, I am at an appointment"  Alternate number to daughter Babs Sciara, on [facility] DPR attempted-- unable to leave voice message  Follow Up Plan: Additional outreach attempts will be made to reach the patient to complete the Transitions of Care (Post Inpatient visit) call.   Oneta Rack, RN, BSN, CCRN Alumnus RN CM Care Coordination/ Transition of [place] Management [phone]: direct office

## 2022-05-15 ENCOUNTER — Telehealth: Payer: Self-pay | Admitting: *Deleted

## 2022-05-15 ENCOUNTER — Encounter: Payer: Self-pay | Admitting: *Deleted

## 2022-05-15 NOTE — Transitions of Care (Post Inpatient/ED Visit) (Signed)
05/15/2022  Name: Brian [name] MRN: [id number] DOB: 1931-08-14  Today's TOC FU Call Status: Today's TOC FU Call Status:: Successful TOC FU Call Competed TOC FU Call Complete Date: 05/15/22  Spoke initially with patient's son [name], listed as preferred contact; he reported his sister Babs Sciara is planning to move in full time "soon" with patient-- he requests that I speak with [name] after our phone call today; spent time with both caregivers today on phone to complete TOC call and associated care coordination activities: VERY supportive family  Daughter Babs Sciara reports she should be primary contact for patient as they transition to her moving in full time with patient in the coming weeks: ph#: [phone]  Transition Care Management Follow-up Telephone Call Date of Discharge: 05/12/22 Discharge Facility: [facility]) Type of Discharge: Inpatient Admission Primary Inpatient Discharge Diagnosis:: Acute on chronic Respiratory Failure/ pleural effusion secondary to CHF How have you been since you were released from the hospital?: Better (Per caregiver/ daughter: "He is doing well, and we want to keep it that way-- we are always here and I am planning to move in full time within the next couple of weeks/ months so I'll be here 24/7 to supervise and help as needed") Any questions or concerns?: Yes Patient Questions/Concerns:: per son/ daughter [name] and Babs Sciara- verified on [facility] DPR: "We want to go ahead and move forward with Palliative Care like they told us to" Patient Questions/Concerns Addressed: Other: (discussed with caregiver need to obtain Palliative care referral from PCP at upcoming scheduled office visit on 05/22/22- daughter/ son aware of role of palliative care services, want home periodic visits; discussed VA benefits as well)  Items Reviewed: Did you receive and understand the discharge instructions provided?: Yes (thoroughly reviewed with patient's caregiver who verbalizes  excellent understanding of same) Medications obtained and verified?: Yes (Medications Reviewed) (Full medication review completed; no concerns or discrepancies identified; confirmed patient obtained/ is taking all newly Rx'd medications as instructed; daughter-manages medications and denies questions/ concerns around medications today) Any new allergies since your discharge?: No Dietary orders reviewed?: Yes Type of Diet Ordered:: Heart Healthy Do you have support at home?: Yes People in Home: child(ren), adult, significant other Name of Support/Comfort Primary Source: Currently resides with significant other-- Daughter/ caregiver [name] planning to move in full time with patient soon- currently has supportive family that checks in throughout each and every day; patient independent in most aspects of self-care activities but requires supervision  Home Care and Equipment/Supplies: [place] Ordered?: No Any new equipment or medical supplies ordered?: No (confirmed has walker; has access to wheelchair as needed- daughter reports patient currently does not have to use assistive devices on a regular basis)  Functional Questionnaire: Do you need assistance with bathing/showering or dressing?: No (daughter/ son supervises/ assists as needed/ indicated) Do you need assistance with meal preparation?: No (daughter/ son supervises/ assists as needed/ indicated) Do you need assistance with eating?: No Do you have difficulty maintaining continence: No Do you need assistance with getting out of bed/getting out of a chair/moving?: No Do you have difficulty managing or taking your medications?: Yes (daughter manages all aspects of medication administration)  Follow up appointments reviewed: PCP Follow-up appointment confirmed?: Yes (care coordination outreach in real-time with scheduling care guide to successfully schedule hospital follow up PCP appointment 05/22/22) Date of PCP follow-up  appointment?: 05/22/22 Follow-up Provider: PCP [facility] Follow-up appointment confirmed?: Yes Date of Specialist follow-up appointment?: 06/07/22 Follow-Up Specialty Provider:: pulmonary provider; also  seeing vascular provider on 05/16/22 Do you need transportation to your follow-up appointment?: No Do you understand care options if your condition(s) worsen?: Yes-patient verbalized understanding  SDOH Interventions Today    Flowsheet Row Most Recent Value  SDOH Interventions   Food Insecurity Interventions Intervention Not Indicated  Transportation Interventions Intervention Not Indicated  [daughter Babs Sciara provides transportation- brother [name] assists as/ if needed]      TOC Interventions Today    Flowsheet Row Most Recent Value  TOC Interventions   TOC Interventions Discussed/Reviewed TOC Interventions Discussed, Arranged PCP follow up less than 12 days/Care Guide scheduled  [care coordination outreach in real-time with scheduling care guide to successfully schedule hospital follow up PCP appointment]      Interventions Today    Flowsheet Row Most Recent Value  Chronic Disease   Chronic disease during today's visit Chronic Obstructive Pulmonary Disease (COPD)  General Interventions   General Interventions Discussed/Reviewed General Interventions Discussed, Referral to Nurse, Doctor Visits, Durable Medical Equipment (DME)  Doctor Visits Discussed/Reviewed Doctor Visits Discussed, PCP, Specialist  Durable Medical Equipment (DME) Oxygen, Wheelchair, Environmental consultant, Other, Glucomoter  [confirmed has and uses pulse oximetry at home]  Forensic psychologist reports has access to wheelchair that was in previous use by another family- for patient use as needed,  reports does not have to use assistive devices regularly]  PCP/Specialist Visits Compliance with follow-up visit  Education Interventions   Education Provided Provided Education  Provided Verbal Education On  [facility], Medication, When to see the doctor, Other  [home use of O2-- education around increased home O2 dosing and need to monitor pulse oximetry at home,  discuss ongoing O2 dosing with pulmonary provider for optimization,  verified patient has Medicare and VA benefits,  role of palliative care]  Nutrition Interventions   Nutrition Discussed/Reviewed Nutrition Discussed  Pharmacy Interventions   Pharmacy Dicussed/Reviewed Pharmacy Topics Discussed  [Full medication review with updating medication list in EHR per patient report]  Safety Interventions   Safety Discussed/Reviewed Safety Discussed      Oneta Rack, RN, BSN, CCRN Alumnus RN CM Care Coordination/ Transition of [place] Management [phone]: direct office

## 2022-05-16 ENCOUNTER — Ambulatory Visit (INDEPENDENT_AMBULATORY_CARE_PROVIDER_SITE_OTHER): Payer: Medicare Other | Admitting: Physician Assistant

## 2022-05-16 ENCOUNTER — Ambulatory Visit (HOSPITAL_COMMUNITY)
Admission: RE | Admit: 2022-05-16 | Discharge: 2022-05-16 | Disposition: A | Discharge status: Home / Self Care | Payer: Medicare Other | Source: Ambulatory Visit | Attending: Vascular Surgery | Admitting: Vascular Surgery

## 2022-05-16 VITALS — BP 180/79 | HR 64 | Resp 18 | Ht 71.0 in | Wt 151.4 lb

## 2022-05-16 DIAGNOSIS — I714 Abdominal aortic aneurysm, without rupture, unspecified: Secondary | ICD-10-CM | POA: Insufficient documentation

## 2022-05-16 NOTE — Progress Notes (Signed)
Office Note   History of Present Illness   Brian [name] is a 89 y.o. (Apr 10, 1931) male who presents for surveillance of AAA.  He is s/p endovascular repair of 5.5cm asymptomatic AAA in December 2020 by Dr. Donzetta Matters.  He was diagnosed with lung cancer in 2019, which is currently stable.  Since his diagnosis, he has required continuous oxygen support.  He was recently discharged from the hospital after COPD exacerbation.  Currently he is requiring 5L of oxygen via nasal cannula.  He denies any abdominal or back pain.  He denies any pain in his legs or feet.  He will be seeing his pulmonologist soon to discuss weaning oxygen requirements.  Current Outpatient Medications  Medication Sig Dispense Refill   ACCU-CHEK COMPACT STRIPS test strip TEST BLOOD SUGAR ONCE DAILY 100 each 1   albuterol (VENTOLIN HFA) 108 (90 Base) MCG/ACT inhaler Inhale 2 puffs into the lungs every 6 (six) hours as needed for wheezing or shortness of breath. 18 g 0   ANORO ELLIPTA 62.5-25 MCG/ACT AEPB USE 1 INHALATION DAILY (Patient taking differently: Inhale 1 puff into the lungs daily.) 240 each 3   Aspirin 81 MG EC tablet Take 81 mg by mouth daily.     Cholecalciferol (VITAMIN D3) 50 MCG (2000 UT) TABS Take 2,000 Units by mouth daily.     Cyanocobalamin (VITAMIN B12 PO) Take 1 tablet by mouth daily.     enalapril (VASOTEC) 10 MG tablet Take 1 tablet (10 mg total) by mouth daily. 90 tablet 3   escitalopram (LEXAPRO) 5 MG tablet Take 1 tablet (5 mg total) by mouth daily. 30 tablet 0   ferrous sulfate 325 (65 FE) MG tablet Take 1 tablet (325 mg total) by mouth daily with breakfast. 30 tablet 1   furosemide (LASIX) 40 MG tablet Take 1 tablet (40 mg total) by mouth daily. (Patient taking differently: Take 40 mg by mouth in the morning.) 90 tablet 3   Magnesium Oxide 420 MG TABS Take 420 mg by mouth daily.      memantine (NAMENDA) 10 MG tablet Take 1 tablet (10 mg) twice a day (Patient taking differently: Take 5 mg by mouth  in the morning.) 60 tablet 11   mirtazapine (REMERON) 15 MG tablet TAKE 1 TABLET(15 MG) BY MOUTH AT BEDTIME (Patient taking differently: Take 15 mg by mouth at bedtime.) 90 tablet 1   OXYGEN Inhale 5 L/min into the lungs continuous.     predniSONE (DELTASONE) 10 MG tablet Take 2 tablets (20 mg total) by mouth 2 (two) times daily with a meal for 7 days, THEN 1 tablet (10 mg total) 2 (two) times daily with a meal for 7 days, THEN 1 tablet (10 mg total) daily with breakfast for 7 days. 49 tablet 0   simvastatin (ZOCOR) 40 MG tablet TAKE 1 TABLET DAILY (Patient taking differently: Take 40 mg by mouth daily.) 90 tablet 3   SYSTANE COMPLETE PF 0.6 % SOLN Place 1 drop into both eyes 4 (four) times daily.     triamcinolone cream (KENALOG) 0.1 % Apply 1 application  topically daily as needed (for dryness/itching (affected areas)).     No current facility-administered medications for this visit.    REVIEW OF SYSTEMS (negative unless checked):   Cardiac:  []  Chest pain or chest pressure? [x]  Shortness of breath upon activity? [x]  Shortness of breath when lying flat? []  Irregular heart rhythm?  Vascular:  []  Pain in calf, thigh, or hip brought on by  walking? []  Pain in feet at night that wakes you up from your sleep? []  Blood clot in your veins? []  Leg swelling?  Pulmonary:  [x]  Oxygen at home? []  Productive cough? []  Wheezing?  Neurologic:  []  Sudden weakness in arms or legs? []  Sudden numbness in arms or legs? []  Sudden onset of difficult speaking or slurred speech? []  Temporary loss of vision in one eye? []  Problems with dizziness?  Gastrointestinal:  []  Blood in stool? []  Vomited blood?  Genitourinary:  []  Burning when urinating? []  Blood in urine?  Psychiatric:  []  Major depression  Hematologic:  []  Bleeding problems? []  Problems with blood clotting?  Dermatologic:  []  Rashes or ulcers?  Constitutional:  []  Fever or chills?  Ear/Nose/Throat:  []  Change in  hearing? []  Nose bleeds? []  Sore throat?  Musculoskeletal:  []  Back pain? []  Joint pain? []  Muscle pain?   Physical Examination   Vitals:   05/16/22 0829  BP: (!) 180/79  Pulse: 64  Resp: 18  SpO2: 100%  Weight: 151 lb 6.4 oz (68.7 kg)  Height: 5\' 11"  (1.803 m)   Body mass index is 21.12 kg/m.  General:  in no acute distress, on supplemental O2 Gait: Not observed HENT: WNL, normocephalic Pulmonary: normal non-labored breathing, on supplemental O2 6L/min Cardiac: regular Abdomen: soft, NT, no masses Skin: without rashes Vascular Exam/Pulses: Nonpalpable pedal pulses. Palpable 2+ radial pulses Extremities: without ischemic changes, without gangrene , without cellulitis; without open wounds;  Musculoskeletal: no muscle wasting or atrophy  Neurologic: A&O X 3;  No focal weakness or paresthesias are detected Psychiatric:  The pt has Normal affect.   Non-Invasive Vascular Imaging   AAA Duplex (05/16/2022) Current size: 3.81 cm Previous size: 3.88 cm (03/13/2021) R CIA: 1.48 cm L CIA: 1.38 cm   Medical Decision Making   Brian [name] is a 89 y.o. (Feb 02, 1932) male who presents for surveillance of AAA s/p EVAR in 2020  Based on this patient's duplex, his stent graft is patent without endoleak.  His AAA is unchanged in size He denies any back or abdominal pain.  He also denies any pain in his feet or in his legs when he walks. He will continue his aspirin and atorvastatin.  He can follow-up with our office in 1 year with repeat EVAR duplex   Vicente Serene PA-C [facility] Office: [phone]  Call MD: Scot Dock

## 2022-05-21 ENCOUNTER — Encounter: Payer: Self-pay | Admitting: Internal Medicine

## 2022-05-21 ENCOUNTER — Ambulatory Visit: Payer: No Typology Code available for payment source | Admitting: Physician Assistant

## 2022-05-21 NOTE — Progress Notes (Unsigned)
Subjective:    Patient ID: Brian [name], male    DOB: 21-May-1931, 89 y.o.   MRN: [id number]     HPI Lo is here for follow up from the hospital.    Admitted 3/19 - 3/23 - TCM 05/16/22  Recommendations for Outpatient Follow-up:  Follow up with PCP in 1-2 weeks Follow-up closely with pulmonology in the next 1 to 2 weeks as discussed Follow-up with family for palliative care discussion given patient's advanced age and worsening condition    Admitted for SOB, worsening fatigue, DOE - needing to rest after 3-4 steps - very hypoxic on arrival to ED.  Admitted for acute hypoxic resp failure presume to be COPD exac.  Negative imaging and ID w/u negative.  Not able to wean off 6 L O2 w/ exertion.    Acute on chronic resp failure w/ hypoxia, presumed COPD flare: Not able to wean off 6 L O2 continuously, but able to walk w/o dyspnea Unclear provoking event CXR, CT chest neg On prolonged steroid taper No abx given Incentive spirometry Anoro daily, nebs prn Anxiety likely playing a role  AKI on CKD: Likely prerenal Increase po intake   HFpEF: envolemic HTN, HLD, DM, mixed alz and vascular dementia: stable   Has no taste or smell - not sure when it started.    BP this am 156/59 - was taking enalapril twice a day but that was decreased due to dehydration/pre-renal in nature.    Currently on 4 L - O2 sat is 99% - today Was the first day that he decrease his oxygen below 6 L which was accidental.  Currently on 4 L and saturating at 99%.  Shortness of breath is much better when he denies shortness of breath.  He is relieved to know that some of what he thought was panic attacks was him not being able to breathe.  Discussed palliative care.  He agrees to try it to see if that would be helpful for him.  Medications and allergies reviewed with patient and updated if appropriate.  Current Outpatient Medications on File Prior to Visit  Medication Sig Dispense Refill    ACCU-CHEK COMPACT STRIPS test strip TEST BLOOD SUGAR ONCE DAILY 100 each 1   albuterol (VENTOLIN HFA) 108 (90 Base) MCG/ACT inhaler Inhale 2 puffs into the lungs every 6 (six) hours as needed for wheezing or shortness of breath. 18 g 0   ANORO ELLIPTA 62.5-25 MCG/ACT AEPB USE 1 INHALATION DAILY (Patient taking differently: Inhale 1 puff into the lungs daily.) 240 each 3   Aspirin 81 MG EC tablet Take 81 mg by mouth daily.     Cholecalciferol (VITAMIN D3) 50 MCG (2000 UT) TABS Take 2,000 Units by mouth daily.     Cyanocobalamin (VITAMIN B12 PO) Take 1 tablet by mouth daily.     enalapril (VASOTEC) 10 MG tablet Take 1 tablet (10 mg total) by mouth daily. 90 tablet 3   escitalopram (LEXAPRO) 5 MG tablet Take 1 tablet (5 mg total) by mouth daily. 30 tablet 0   ferrous sulfate 325 (65 FE) MG tablet Take 1 tablet (325 mg total) by mouth daily with breakfast. 30 tablet 1   furosemide (LASIX) 40 MG tablet Take 1 tablet (40 mg total) by mouth daily. (Patient taking differently: Take 40 mg by mouth in the morning.) 90 tablet 3   Magnesium Oxide 420 MG TABS Take 420 mg by mouth daily.      memantine (NAMENDA) 10  MG tablet Take 1 tablet (10 mg) twice a day (Patient taking differently: Take 5 mg by mouth in the morning.) 60 tablet 11   mirtazapine (REMERON) 15 MG tablet TAKE 1 TABLET(15 MG) BY MOUTH AT BEDTIME (Patient taking differently: Take 15 mg by mouth at bedtime.) 90 tablet 1   OXYGEN Inhale 5 L/min into the lungs continuous.     predniSONE (DELTASONE) 10 MG tablet Take 2 tablets (20 mg total) by mouth 2 (two) times daily with a meal for 7 days, THEN 1 tablet (10 mg total) 2 (two) times daily with a meal for 7 days, THEN 1 tablet (10 mg total) daily with breakfast for 7 days. 49 tablet 0   simvastatin (ZOCOR) 40 MG tablet TAKE 1 TABLET DAILY (Patient taking differently: Take 40 mg by mouth daily.) 90 tablet 3   SYSTANE COMPLETE PF 0.6 % SOLN Place 1 drop into both eyes 4 (four) times daily.      triamcinolone cream (KENALOG) 0.1 % Apply 1 application  topically daily as needed (for dryness/itching (affected areas)).     No current facility-administered medications on file prior to visit.     Review of Systems  Constitutional:  Negative for appetite change and fever.  HENT:  Negative for congestion.   Respiratory:  Negative for cough, shortness of breath and wheezing.   Cardiovascular:  Negative for chest pain, palpitations and leg swelling.  Gastrointestinal:  Negative for abdominal pain.  Neurological:  Negative for light-headedness and headaches.       Objective:   Vitals:   05/22/22 1310  BP: 132/82  Pulse: 70  Temp: 98.1 F (36.7 C)  SpO2: 99%   BP Readings from Last 3 Encounters:  05/22/22 132/82  05/16/22 (!) 180/79  05/12/22 (!) 161/57   Wt Readings from Last 3 Encounters:  05/22/22 147 lb (66.7 kg)  05/16/22 151 lb 6.4 oz (68.7 kg)  05/08/22 143 lb 4.8 oz (65 kg)   Body mass index is 20.5 kg/m.    Physical Exam Constitutional:      General: He is not in acute distress.    Appearance: Normal appearance. He is not ill-appearing.  HENT:     Head: Normocephalic and atraumatic.  Eyes:     Conjunctiva/sclera: Conjunctivae normal.  Cardiovascular:     Rate and Rhythm: Normal rate and regular rhythm.     Heart sounds: Normal heart sounds.  Pulmonary:     Effort: Pulmonary effort is normal. No respiratory distress.     Breath sounds: Normal breath sounds. No wheezing or rales.  Musculoskeletal:     Right lower leg: No edema.     Left lower leg: No edema.  Skin:    General: Skin is warm and dry.     Findings: No rash.  Neurological:     Mental Status: He is alert. Mental status is at baseline.  Psychiatric:        Mood and Affect: Mood normal.        Lab Results  Component Value Date   WBC 7.3 05/12/2022   HGB 9.5 (L) 05/12/2022   HCT 30.2 (L) 05/12/2022   PLT 211 05/12/2022   GLUCOSE 161 (H) 05/12/2022   CHOL 123 03/30/2022   TRIG  98.0 03/30/2022   HDL 47.70 03/30/2022   LDLCALC 56 03/30/2022   ALT 12 05/08/2022   AST 18 05/08/2022   NA 135 05/12/2022   K 4.8 05/12/2022   CL 101 05/12/2022   CREATININE 2.07 (H)  05/12/2022   BUN 55 (H) 05/12/2022   CO2 26 05/12/2022   TSH 1.77 10/04/2021   PSA 1.42 07/16/2007   INR 1.2 01/21/2019   HGBA1C 6.5 03/30/2022   MICROALBUR 9.45 03/19/2018     Assessment & Plan:    See Problem List for Assessment and Plan of chronic medical problems.

## 2022-05-22 ENCOUNTER — Encounter: Payer: Self-pay | Admitting: Internal Medicine

## 2022-05-22 ENCOUNTER — Ambulatory Visit: Payer: No Typology Code available for payment source | Admitting: Internal Medicine

## 2022-05-22 VITALS — BP 132/82 | HR 70 | Temp 98.1°F | Ht 71.0 in | Wt 147.0 lb

## 2022-05-22 DIAGNOSIS — F419 Anxiety disorder, unspecified: Secondary | ICD-10-CM

## 2022-05-22 DIAGNOSIS — J449 Chronic obstructive pulmonary disease, unspecified: Secondary | ICD-10-CM

## 2022-05-22 DIAGNOSIS — F0154 Vascular dementia, unspecified severity, with anxiety: Secondary | ICD-10-CM

## 2022-05-22 DIAGNOSIS — J439 Emphysema, unspecified: Secondary | ICD-10-CM

## 2022-05-22 DIAGNOSIS — E1122 Type 2 diabetes mellitus with diabetic chronic kidney disease: Secondary | ICD-10-CM

## 2022-05-22 DIAGNOSIS — E782 Mixed hyperlipidemia: Secondary | ICD-10-CM

## 2022-05-22 DIAGNOSIS — G309 Alzheimer's disease, unspecified: Secondary | ICD-10-CM

## 2022-05-22 DIAGNOSIS — R419 Unspecified symptoms and signs involving cognitive functions and awareness: Secondary | ICD-10-CM | POA: Insufficient documentation

## 2022-05-22 DIAGNOSIS — F01A0 Vascular dementia, mild, without behavioral disturbance, psychotic disturbance, mood disturbance, and anxiety: Secondary | ICD-10-CM

## 2022-05-22 DIAGNOSIS — F0280 Dementia in other diseases classified elsewhere without behavioral disturbance: Secondary | ICD-10-CM

## 2022-05-22 DIAGNOSIS — I10 Essential (primary) hypertension: Secondary | ICD-10-CM

## 2022-05-22 DIAGNOSIS — J9621 Acute and chronic respiratory failure with hypoxia: Secondary | ICD-10-CM

## 2022-05-22 DIAGNOSIS — N182 Chronic kidney disease, stage 2 (mild): Secondary | ICD-10-CM

## 2022-05-22 MED ORDER — ENALAPRIL MALEATE 10 MG PO TABS: 10.0000 mg | ORAL_TABLET | Freq: Two times a day (BID) | ORAL | 3 refills | Status: DC

## 2022-05-22 NOTE — Assessment & Plan Note (Signed)
Chronic With chronic respiratory failure on oxygen therapy continuously Recent exacerbation-cause unknown Has follow-up with pulmonary Continue Anoro daily, albuterol inhaler as needed

## 2022-05-22 NOTE — Assessment & Plan Note (Signed)
Chronic Blood pressure okay here today, but not ideally controlled at home Increase enalapril back to 10 mg twice daily He is drinking a good amount of fluids and I think he can avoid dehydration Will need to monitor renal function

## 2022-05-22 NOTE — Assessment & Plan Note (Signed)
Chronic Anxiety controlled-some of his panic attacks that he was experiencing were likely issues with breathing related to his COPD Continue Lexapro 5 mg daily, Remeron 15 mg at bedtime

## 2022-05-22 NOTE — Assessment & Plan Note (Signed)
Recent admission for acute on chronic respiratory failure with hypoxia-presumed COPD flare No provoking event-no evidence of infection-CT chest, chest x-ray negative Was experiencing shortness of breath at home-was not able to wean himself off 6 L of oxygen Much improved shortness of breath and actually denies any shortness of breath at this time Doing well with prednisone taper-advised he will need to monitor his breathing and oxygen need when he is off the prednisone Has pulmonary follow-up scheduled Continue albuterol inhaler as needed, Anoro inhaler daily Today is on 4 L oxygen via nasal cannula-oxygen saturation 99%-discussed that he can wean himself off slowly based on oxygen level-can call with any questions or concerns

## 2022-05-22 NOTE — Patient Instructions (Addendum)
        Medications changes include :   increase enapril to twice daily     Return for follow up as scheduled.

## 2022-05-22 NOTE — Assessment & Plan Note (Signed)
Chronic  Continue simvastatin 40 mg daily  

## 2022-05-22 NOTE — Assessment & Plan Note (Signed)
Chronic   Lab Results  Component Value Date   HGBA1C 6.5 03/30/2022   Sugars controlled Testing sugars 1 times a day Continue lifestyle control

## 2022-05-22 NOTE — Assessment & Plan Note (Signed)
Chronic Following with neurology Recently saw neurology at the [facility] as well Memory stable Continue Namenda 10 mg twice daily

## 2022-05-23 ENCOUNTER — Other Ambulatory Visit: Payer: Medicare Other

## 2022-05-23 DIAGNOSIS — Z515 Encounter for palliative care: Secondary | ICD-10-CM

## 2022-05-23 NOTE — Progress Notes (Signed)
COMMUNITY PALLIATIVE CARE SW NOTE  PATIENT NAME: Brian [name] DOB: [birth year] MRN: [id number]  PRIMARY CARE PROVIDER: Binnie Rail, MD  RESPONSIBLE PARTY:  Acct ID - Guarantor Home Phone Work Phone Relationship Acct Type  [id number] RICCO, [id number]  Self P/F     [address], [place], [place] [address]   Initial Palliative Care Encounter/Clinical Social Work  [employer] SW completed an initial telephonic encounter with patient 's daughter-Brian [name]. SW provided education to her regarding palliative care services. Brian [name] provided an initial verbal consent to services and provided a brief status update on her father.  Patient is on 6L of o2 currently, but he is trying to wen himself down. He is on Lexapro for his panic attacks. He is also receiving mental health services through the [place] as his anxiety causes him to question "What if ? " He takes his weight, blood pressure and o2 daily. He uses a cane in the home. He also has a wheelchair to use when he goes out to the doctor's. His daughter is open to services. SW schedule a follow-up visit.  Next appointment is scheduled for 05/29/22 @ 9am.   Social History   Tobacco Use   Smoking status: Former    Packs/day: 1.00    Years: 40.00    Additional pack years: 0.00    Total pack years: 40.00    Types: Cigarettes    Quit date: 02/20/2003    Years since quitting: 19.2   Smokeless tobacco: Never  Substance Use Topics   Alcohol use: Not Currently    Alcohol/week: 6.0 standard drinks of alcohol    Types: 3 Glasses of wine, 3 Shots of liquor per week    Comment:  1-3 drinks a week    CODE STATUS:Full Code ADVANCED DIRECTIVES: No MOST FORM COMPLETE:  No HOSPICE EDUCATION PROVIDED: No  Duration of encounter and documentation: 30 minutes  [employer], LCSW

## 2022-05-29 ENCOUNTER — Other Ambulatory Visit: Payer: Medicare Other

## 2022-05-29 DIAGNOSIS — J439 Emphysema, unspecified: Secondary | ICD-10-CM | POA: Diagnosis not present

## 2022-06-06 ENCOUNTER — Ambulatory Visit: Payer: No Typology Code available for payment source | Admitting: Physician Assistant

## 2022-06-07 ENCOUNTER — Encounter: Payer: Self-pay | Admitting: Emergency Medicine

## 2022-06-07 ENCOUNTER — Ambulatory Visit (INDEPENDENT_AMBULATORY_CARE_PROVIDER_SITE_OTHER): Payer: Medicare Other | Admitting: Emergency Medicine

## 2022-06-07 ENCOUNTER — Encounter: Payer: Self-pay | Admitting: Internal Medicine

## 2022-06-07 VITALS — BP 130/68 | HR 58 | Temp 98.1°F | Ht 71.0 in | Wt 147.8 lb

## 2022-06-07 DIAGNOSIS — J439 Emphysema, unspecified: Secondary | ICD-10-CM | POA: Diagnosis not present

## 2022-06-07 DIAGNOSIS — I5032 Chronic diastolic (congestive) heart failure: Secondary | ICD-10-CM | POA: Diagnosis not present

## 2022-06-07 MED ORDER — PREDNISONE 10 MG PO TABS: 20.0000 mg | ORAL_TABLET | Freq: Every day | ORAL | 0 refills | Status: DC

## 2022-06-07 MED ORDER — FERROUS SULFATE 325 (65 FE) MG PO TABS: 325.0000 mg | ORAL_TABLET | Freq: Every day | ORAL | 1 refills | Status: DC

## 2022-06-07 NOTE — Assessment & Plan Note (Signed)
Currently more limited currently than his previous baseline, higher oxygen need.  Difficult to say whether this was an acute decompensation that led to the hospitalization or may be a slower decline.  He felt better when he left the hospital on prednisone, now feels increased dyspnea, seeing more desaturations.  He is on good bronchodilator therapy.  I think at this point we reasonable to try maintenance prednisone to see if he gets benefit.  I will start with 20 mg but hopefully we can work him down to the lowest effective dose, maybe 10 mg.  Could also consider adding daily azithromycin at some point.  We will continue your Anoro 1 inhalation once daily. Keep your albuterol available to use 2 puffs when needed for shortness of breath, chest tightness, wheezing. Continue your oxygen at 6 L/min.  Our goal is to keep your saturations > 90% as much as possible We will start prednisone 20 mg once daily.  Take this every day as a maintenance medication.  We will consider decreasing the dose depending on your response Follow-up with APP in 1 month to discuss how you are doing on the prednisone and to consider decreasing the dose to 15 mg daily Follow Dr. Delton Coombes in 3 months, sooner if you have problems

## 2022-06-07 NOTE — Addendum Note (Signed)
Addended by: Dorisann Frames R on: 06/07/2022 09:39 AM   Modules accepted: Orders

## 2022-06-07 NOTE — Assessment & Plan Note (Signed)
Agree with the posthospitalization plan to take your Lasix every day on a schedule (instead of as needed)

## 2022-06-07 NOTE — Progress Notes (Signed)
Subjective:    Patient ID: Brian [name], male    DOB: 1931-09-11, 89 y.o.   MRN: [id number]  HPI  Hospital follow-up visit 06/07/2022 --89 year old gentleman whom I have followed for severe COPD.  Bilateral upper lobe nodular disease with a right upper lobe nodule that was increasing in size, was PET positive and which prompted SBRT (last 02/2020).  Chronic pleural effusion due to CHF.  I last saw him in 05/2020.  He had been on oxygen 2-3 L/min.  PMH: Hypertension, CAD/CABG with associated systolic CHF, diabetes, GI bleeding, blood loss anemia, mixed vascular dementia Hospital admission 05/08/2022-05/12/2022 with acute on chronic hypoxemic respiratory failure requiring increased O2, 6 L/min.  Presumed acute exacerbation of his COPD.  CT-PA done 05/08/2022 did not show PE, showed chronic small to moderate right pleural effusion with some atelectasis, stable right upper lobe radiation fibrotic changes, stable 6 mm left upper lobe nodule and borderline mediastinal adenopathy. He has weaned off pred. Minimal cough, no wheeze. Having SpO2 85% when exerting on his O2. He is off the pred, notices a decline since it weaned to off. His lasix was changed to scheduled dosing (from prn).   Currently managed on Anoro, albuterol occasionally.       Objective:   Physical Exam  Vitals:   06/07/22 0852  BP: 130/68  Pulse: (!) 58  Temp: 98.1 F (36.7 C)  TempSrc: Oral  SpO2: 97%  Weight: 147 lb 12.8 oz (67 kg)  Height:  (1.803 m)   Gen: Pleasant, thin, in no distress,  normal affect  ENT: No lesions,  mouth clear,  oropharynx clear, no postnasal drip  Neck: No JVD, no stridor  Lungs: No use of accessory muscles, no wheezing on normal respiration, no wheeze on forced expiration.   Cardiovascular: RRR, heart sounds normal, no murmur or gallops, no peripheral edema  Musculoskeletal: No deformities, no cyanosis or clubbing  Neuro: alert, awake, non focal  Skin: Warm, no lesions or rash      Assessment & Plan:  COPD (chronic obstructive pulmonary disease) (HCC) Currently more limited currently than his previous baseline, higher oxygen need.  Difficult to say whether this was an acute decompensation that led to the hospitalization or may be a slower decline.  He felt better when he left the hospital on prednisone, now feels increased dyspnea, seeing more desaturations.  He is on good bronchodilator therapy.  I think at this point we reasonable to try maintenance prednisone to see if he gets benefit.  I will start with 20 mg but hopefully we can work him down to the lowest effective dose, maybe 10 mg.  Could also consider adding daily azithromycin at some point.  We will continue your Anoro 1 inhalation once daily. Keep your albuterol available to use 2 puffs when needed for shortness of breath, chest tightness, wheezing. Continue your oxygen at 6 L/min.  Our goal is to keep your saturations > 90% as much as possible We will start prednisone 20 mg once daily.  Take this every day as a maintenance medication.  We will consider decreasing the dose depending on your response Follow-up with APP in 1 month to discuss how you are doing on the prednisone and to consider decreasing the dose to 15 mg daily Follow Dr. Delton Coombes in 3 months, sooner if you have problems  Chronic diastolic heart failure (HCC) Agree with the posthospitalization plan to take your Lasix every day on a schedule (instead of as needed)  Brian [name]  Delton Coombes, MD, PhD 06/07/2022, 9:30 AM [facility] [phone] or if no answer [phone] f

## 2022-06-07 NOTE — Patient Instructions (Signed)
We will continue your Anoro 1 inhalation once daily. Keep your albuterol available to use 2 puffs when needed for shortness of breath, chest tightness, wheezing. Continue your oxygen at 6 L/min.  Our goal is to keep your saturations > 90% as much as possible We will start prednisone 20 mg once daily.  Take this every day as a maintenance medication.  We will consider decreasing the dose depending on your response Agree with the posthospitalization plan to take your Lasix every day on a schedule (instead of as needed) Follow-up with APP in 1 month to discuss how you are doing on the prednisone and to consider decreasing the dose to 15 mg daily Follow Dr. Delton Coombes in 3 months, sooner if you have problems

## 2022-06-08 ENCOUNTER — Ambulatory Visit: Payer: Self-pay

## 2022-06-08 NOTE — Patient Instructions (Signed)
Visit Information  Thank you for taking time to visit with me today. Please don't hesitate to contact me if I can be of assistance to you.   Following are the goals we discussed today:   Goals Addressed             This Visit's Progress    Continue to improve post hospitalization       Interventions Today    Flowsheet Row Most Recent Value  Chronic Disease   Chronic disease during today's visit Congestive Heart Failure (CHF), Chronic Obstructive Pulmonary Disease (COPD), Diabetes, Chronic Kidney Disease/End Stage Renal Disease (ESRD)  General Interventions   General Interventions Discussed/Reviewed General Interventions Discussed, Doctor Visits  Doctor Visits Discussed/Reviewed Doctor Visits Discussed, PCP, Specialist  [reviewed instructions per pulmonologist visit 06/07/22 and primary care office visit 05/22/22.]  PCP/Specialist Visits Compliance with follow-up visit  Education Interventions   Education Provided Provided Education  [reviewed instructions per Pulmonolgy visit 06/07/22,  reinforced signs/symptoms of HF exacerbation, reiterated the importance of daily weights. encouraged to contact provider with any health questions or concerns.]  Provided Verbal Education On When to see the doctor, Blood Sugar Monitoring, Other, Insurance Plans  [discussedpossiblity of Prednisone increasing blood sugar-encouraged to continue to monitor as previous and notify provider if any questions or concerns. encouraged to contact insurance company to see if patient has a meal benefit with his plan.]  Nutrition Interventions   Nutrition Discussed/Reviewed Fluid intake, Nutrition Discussed  Pharmacy Interventions   Pharmacy Dicussed/Reviewed Pharmacy Topics Discussed  [Medications reviewed. Encouraged to continue to take medications as prescribed.]            Our next appointment is by telephone on 07/05/21 at 11:30 am  Please call the care guide team at [phone] if you need to cancel or  reschedule your appointment.   If you are experiencing a Mental Health or Behavioral Health Crisis or need someone to talk to, please call the Suicide and Crisis Lifeline: 54   Kathyrn Sheriff, RN, MSN, BSN, [employer] [phone]

## 2022-06-08 NOTE — Patient Outreach (Signed)
  Care Coordination   Initial Visit Note   06/08/2022 Name: Brian [name] MRN: [id number] DOB: [birth year]  Brian [name] is a 89 y.o. year old male who sees Burns, Bobette Mo, MD for primary care. I spoke with  Brian [name] and daughter Brian [name] by phone today.  What matters to the patients health and wellness today?  Admitted 05/08/22-05/12/22 with COPD exacerbation. Brian [name] reports he is feeling better at this time. Brian [name] states patient was having some SOB when he went to pulmonary office yesterday, But after starting maintenance Prednisone yesterday, he is feeling better. They deny any questions or concerns at this time.  Goals Addressed             This Visit's Progress    Continue to improve post hospitalization       Interventions Today    Flowsheet Row Most Recent Value  Chronic Disease   Chronic disease during today's visit Congestive Heart Failure (CHF), Chronic Obstructive Pulmonary Disease (COPD), Diabetes, Chronic Kidney Disease/End Stage Renal Disease (ESRD)  General Interventions   General Interventions Discussed/Reviewed General Interventions Discussed, Doctor Visits  Doctor Visits Discussed/Reviewed Doctor Visits Discussed, PCP, Specialist  [reviewed instructions per pulmonologist visit 06/07/22 and primary care office visit 05/22/22.]  PCP/Specialist Visits Compliance with follow-up visit  Education Interventions   Education Provided Provided Education  [reviewed instructions per Pulmonolgy visit 06/07/22,  reinforced signs/symptoms of HF exacerbation, reiterated the importance of daily weights. encouraged to contact provider with any health questions or concerns.]  Provided Verbal Education On When to see the doctor, Blood Sugar Monitoring, Other, Insurance Plans  [discussedpossiblity of Prednisone increasing blood sugar-encouraged to continue to monitor as previous and notify provider if any questions or concerns. encouraged to contact insurance company to see if  patient has a meal benefit with his plan.]  Nutrition Interventions   Nutrition Discussed/Reviewed Fluid intake, Nutrition Discussed  Pharmacy Interventions   Pharmacy Dicussed/Reviewed Pharmacy Topics Discussed  [Medications reviewed. Encouraged to continue to take medications as prescribed.]            SDOH assessments and interventions completed:  No  SDOH Interventions Today    Flowsheet Row Most Recent Value  SDOH Interventions   Food Insecurity Interventions Intervention Not Indicated  Housing Interventions Intervention Not Indicated  Transportation Interventions Intervention Not Indicated     Care Coordination Interventions:  Yes, provided   Follow up plan: Follow up call scheduled for 07/06/22    Encounter Outcome:  Pt. Visit Completed   Kathyrn Sheriff, RN, MSN, BSN, CCM [facility] Care Coordinator [phone]

## 2022-06-12 DIAGNOSIS — J439 Emphysema, unspecified: Secondary | ICD-10-CM | POA: Diagnosis not present

## 2022-06-26 ENCOUNTER — Other Ambulatory Visit: Payer: Medicare Other

## 2022-06-26 VITALS — BP 130/54 | HR 70 | Temp 97.8°F | Resp 20

## 2022-06-26 DIAGNOSIS — Z515 Encounter for palliative care: Secondary | ICD-10-CM

## 2022-06-26 NOTE — Progress Notes (Signed)
PATIENT NAME: Brian [name] DOB: Oct 25, 1931 MRN: [id number]  PRIMARY CARE PROVIDER: [name], MD  RESPONSIBLE PARTY:  Acct ID - Guarantor Home Phone Work Phone Relationship Acct Type  [id number] Brian [name], LARY* [phone]  Self P/F     [address], [place], [place] [address]   Palliative Care Initial Encounter Note   Completed home visit. Daughter Brian [name] also present via phone.       TODAY'S VISIT:  Respiratory: gets SOB with exertion; when SOB changes pt will change setting to 6L  Cardiac: no edema; HR regular  Cognitive: Dementia; appears   Appetite: eats 3 meals per day; drinks adequate amount of fluids  GI/GU: no urinary issues; has 2-3 BMs daily   Mobility: able to ambulate independently  ADLs: cleans his house; cooks, bathes, grooms and dresses independently   Sleeping Pattern: sleeps through the night except to go to bathroom and then goes back to sleep  Pain: denies pain during this visit  Wt: pt and dtr beleive the pt has picked up a little weight  Palliative Care/ Hospice: LPN explained role and purpose of palliative care including visit frequency. Also discussed benefits of hospice care as well as the differences between the two with patient.   Goals of Care: To stay in the home with caregivers when needed    Next Appt Scheduled For: 07/24/22 @ 10:30am    CODE STATUS: Full Code  ADVANCED DIRECTIVES: N MOST FORM: N PPS: 80%   PHYSICAL EXAM:   VITALS: Today's Vitals   06/26/22 1032  BP: (!) 130/54  Pulse: 70  Resp: 20  Temp: 97.8 F (36.6 C)  TempSrc: Temporal  SpO2: 97%  PainSc: 0-No pain    LUNGS: clear to auscultation , decreased breath sounds CARDIAC: Cor RRR EXTREMITIES: AROM x4; no edema SKIN: Skin color, texture, turgor normal. No rashes or lesions  NEURO: negative except for memory problems       Danaisha Celli Clementeen Graham, LPN

## 2022-06-28 DIAGNOSIS — J439 Emphysema, unspecified: Secondary | ICD-10-CM | POA: Diagnosis not present

## 2022-07-05 ENCOUNTER — Encounter: Payer: Self-pay | Admitting: Emergency Medicine

## 2022-07-05 ENCOUNTER — Ambulatory Visit: Payer: No Typology Code available for payment source | Admitting: Emergency Medicine

## 2022-07-05 VITALS — BP 134/76 | HR 68 | Temp 98.0°F | Ht 71.0 in | Wt 149.9 lb

## 2022-07-05 DIAGNOSIS — J9621 Acute and chronic respiratory failure with hypoxia: Secondary | ICD-10-CM

## 2022-07-05 DIAGNOSIS — J439 Emphysema, unspecified: Secondary | ICD-10-CM | POA: Diagnosis not present

## 2022-07-05 MED ORDER — PREDNISONE 10 MG PO TABS: 15.0000 mg | ORAL_TABLET | Freq: Every day | ORAL | 1 refills | Status: DC

## 2022-07-05 MED ORDER — SULFAMETHOXAZOLE-TRIMETHOPRIM 800-160 MG PO TABS: 1.0000 | ORAL_TABLET | Freq: Every day | ORAL | 5 refills | Status: DC

## 2022-07-05 NOTE — Patient Instructions (Addendum)
Please continue Anoro as you have been taking it Keep albuterol available to use if you needed for shortness of breath, chest tightness, wheezing. We will try decreasing your prednisone to 15 mg once daily.  We will give you a new prescription for 10 mg tablets Please start Bactrim 160 mg.  Take 1 pill on Monday, Wednesday, Friday.  We will continue this medication while you are on every day prednisone Continue oxygen at 4-6 L/min depending on your level of exertion Continue your other medications as directed Follow with APP in 1 month Follow with Dr. Delton Coombes in 6 months, sooner if you have problems.

## 2022-07-05 NOTE — Assessment & Plan Note (Signed)
Continue oxygen at 4-6 L/min depending on your level of exertion

## 2022-07-05 NOTE — Addendum Note (Signed)
Addended by: Dorisann Frames R on: 07/05/2022 10:30 AM   Modules accepted: Orders

## 2022-07-05 NOTE — Assessment & Plan Note (Signed)
He has been a significant improvement compared with our last visit.  The prednisone 20 mg is likely contributor.  He is also continuing to get stronger post significant hospitalization in late March.  Were going to try to decrease his prednisone to 15 mg daily, see if he tolerates.  We can continue to wean if he does not lose ground.  Suspect his chronic dose will be 10-15 mg daily.  I will start prophylactic Bactrim since were going to stay on the prednisone.  Please continue Anoro as you have been taking it Keep albuterol available to use if you needed for shortness of breath, chest tightness, wheezing. We will try decreasing your prednisone to 15 mg once daily.  We will give you a new prescription for 10 mg tablets Please start Bactrim 160 mg.  Take 1 pill on Monday, Wednesday, Friday.  We will continue this medication while you are on every day prednisone Continue your other medications as directed Follow with APP in 1 month Follow with Dr. Delton Coombes in 6 months, sooner if you have problems.

## 2022-07-05 NOTE — Progress Notes (Signed)
Subjective:    Patient ID: Brian [name], male    DOB: 27-Jul-1931, 89 y.o.   MRN: [id number]  HPI  Hospital follow-up visit 06/07/2022 --89 year old gentleman whom I have followed for severe COPD.  Bilateral upper lobe nodular disease with a right upper lobe nodule that was increasing in size, was PET positive and which prompted SBRT (last 02/2020).  Chronic pleural effusion due to CHF.  I last saw him in 05/2020.  He had been on oxygen 2-3 L/min.  PMH: Hypertension, CAD/CABG with associated systolic CHF, diabetes, GI bleeding, blood loss anemia, mixed vascular dementia Hospital admission 05/08/2022-05/12/2022 with acute on chronic hypoxemic respiratory failure requiring increased O2, 6 L/min.  Presumed acute exacerbation of his COPD.  CT-PA done 05/08/2022 did not show PE, showed chronic small to moderate right pleural effusion with some atelectasis, stable right upper lobe radiation fibrotic changes, stable 6 mm left upper lobe nodule and borderline mediastinal adenopathy. He has weaned off pred. Minimal cough, no wheeze. Having SpO2 85% when exerting on his O2. He is off the pred, notices a decline since it weaned to off. His lasix was changed to scheduled dosing (from prn).   Currently managed on Anoro, albuterol occasionally.   ROV 07/05/22 --follow-up visit for 89 year old gentleman with very severe COPD.  Also history of SBRT to a PET positive right upper lobe nodule that was increasing in size.  He has hypertension, CAD/CABG with associated systolic CHF and chronic pleural effusion, diabetes, GI bleeding with anemia, mixed vascular dementia.  At his last visit 1 month ago I started him on maintenance prednisone 20 mg daily.  He is on Anoro.  Uses oxygen 4-6 L/min. He has benefited significantly from the prednisone > better breathing, functional capacity. More stamina.       Objective:   Physical Exam  Vitals:   07/05/22 1002  BP: 134/76  Pulse: 68  Temp: 98 F (36.7 C)  TempSrc: Oral   SpO2: 99%  Weight: 149 lb 14.4 oz (68 kg)  Height: 5\' 11"  (1.803 m)   Gen: Pleasant, thin, in no distress,  normal affect  ENT: No lesions,  mouth clear,  oropharynx clear, no postnasal drip  Neck: No JVD, no stridor  Lungs: No use of accessory muscles, no wheezing on normal respiration, no wheeze on forced expiration.   Cardiovascular: RRR, heart sounds normal, no murmur or gallops, no peripheral edema  Musculoskeletal: No deformities, no cyanosis or clubbing  Neuro: alert, awake, non focal  Skin: Warm, no lesions or rash     Assessment & Plan:  COPD (chronic obstructive pulmonary disease) (HCC) He has been a significant improvement compared with our last visit.  The prednisone 20 mg is likely contributor.  He is also continuing to get stronger post significant hospitalization in late March.  Were going to try to decrease his prednisone to 15 mg daily, see if he tolerates.  We can continue to wean if he does not lose ground.  Suspect his chronic dose will be 10-15 mg daily.  I will start prophylactic Bactrim since were going to stay on the prednisone.  Please continue Anoro as you have been taking it Keep albuterol available to use if you needed for shortness of breath, chest tightness, wheezing. We will try decreasing your prednisone to 15 mg once daily.  We will give you a new prescription for 10 mg tablets Please start Bactrim 160 mg.  Take 1 pill on Monday, Wednesday, Friday.  We will continue this  medication while you are on every day prednisone Continue your other medications as directed Follow with APP in 1 month Follow with Dr. Delton Coombes in 6 months, sooner if you have problems.  Acute on chronic respiratory failure with hypoxia (HCC) Continue oxygen at 4-6 L/min depending on your level of exertion   Levy Pupa, MD, PhD 07/05/2022, 10:23 AM [facility] [phone] or if no answer [phone] f

## 2022-07-10 ENCOUNTER — Telehealth: Payer: Self-pay | Admitting: *Deleted

## 2022-07-10 NOTE — Telephone Encounter (Signed)
CALLED PATIENT TO INFORM OF CT FOR 07-18-22- ARRIVAL TIME- 8:15 AM @ [facility], NO RESTRICTIONS TO TEST, PATIENT TO RECEIVE RESULTS FROM DR. KINARD ON 07-23-22 @ 11 AM, SPOKE WITH PATIENT'S SON- TIMOTHY AND HE IS AWRE OF THESE APPTS. AND THE INSTRUCTIONS

## 2022-07-11 ENCOUNTER — Other Ambulatory Visit: Payer: Self-pay | Admitting: Internal Medicine

## 2022-07-12 DIAGNOSIS — J439 Emphysema, unspecified: Secondary | ICD-10-CM | POA: Diagnosis not present

## 2022-07-17 ENCOUNTER — Ambulatory Visit: Payer: Self-pay

## 2022-07-17 NOTE — Patient Outreach (Signed)
  Care Coordination   Follow Up Visit Note   07/17/2022 Name: Brian [name] MRN: [id number] DOB: 11/10/1931  Brian [name] is a 89 y.o. year old male who sees Burns, Bobette Mo, MD for primary care. I spoke with  Brian [name] and daughter Brian [name] by phone today.  What matters to the patients health and wellness today?  Brian [name] reports he is doing good and feeling better. He continues to live alone with daughter Brian [name] checking in on him daily and staying at night sometime. He reports continues to use Oxygen and states breathing improved over the months-continues to follow up with pulmonologist. Brian [name] reports Brian [name] continues to prepare his own meals and is eating healthier meals at this time. She continues to take him to his provider visits and assist as needed. Palliative care involved. Brian [name] and daughter deny any care coordination needs at this time and will contact RNCM if needs in the future.  Goals Addressed             This Visit's Progress    COMPLETED: Continue to improve post hospitalization       Interventions Today    Flowsheet Row Most Recent Value  Chronic Disease   Chronic disease during today's visit Chronic Obstructive Pulmonary Disease (COPD)  General Interventions   General Interventions Discussed/Reviewed General Interventions Reviewed, Durable Medical Equipment (DME), Doctor Visits  Doctor Visits Discussed/Reviewed Doctor Visits Reviewed  [advised to continue to follow up with providers as scheduled. Patient is also active with Palliative care.]  Durable Medical Equipment (DME) Oxygen, Other  [advised to continue to use as prescribed by provider.]  Education Interventions   Education Provided Provided Education  Provided Verbal Education On Other, Medication  [advised to continue to attend provider visits as scheduled, advisid to continue to take medications as prescribed. discussed eating healthy.]  Nutrition Interventions   Nutrition  Discussed/Reviewed Nutrition Discussed  Pharmacy Interventions   Pharmacy Dicussed/Reviewed Pharmacy Topics Reviewed  Safety Interventions   Safety Discussed/Reviewed Home Safety  Home Safety Assistive Devices  [place] home resources. daughter follows up with patient daily.]            SDOH assessments and interventions completed:  No  Care Coordination Interventions:  Yes, provided   Follow up plan: No further intervention required.   Encounter Outcome:  Pt. Visit Completed   Kathyrn Sheriff, RN, MSN, BSN, CCM [facility] Care Coordinator [phone]

## 2022-07-17 NOTE — Patient Instructions (Signed)
Visit Information  Thank you for taking time to visit with me today. Please don't hesitate to contact me if I can be of assistance to you.   Following are the goals we discussed today:  Continue to take medications as prescribed Continue to attend provider visits as recommended/scheduled Continue to call provider with any health questions or concerns Contact your RN Care Coordination if any care coordination or resource needs  If you are experiencing a Mental Health or Behavioral Health Crisis or need someone to talk to, please call the Suicide and Crisis Lifeline: 43  Kathyrn Sheriff, RN, MSN, BSN, CCM [facility] Care Coordinator [phone]

## 2022-07-18 ENCOUNTER — Other Ambulatory Visit (HOSPITAL_COMMUNITY): Payer: No Typology Code available for payment source

## 2022-07-19 ENCOUNTER — Other Ambulatory Visit: Payer: Self-pay | Admitting: Emergency Medicine

## 2022-07-23 ENCOUNTER — Ambulatory Visit (HOSPITAL_COMMUNITY)
Admission: RE | Admit: 2022-07-23 | Discharge: 2022-07-23 | Disposition: A | Discharge status: Home / Self Care | Payer: No Typology Code available for payment source | Source: Ambulatory Visit | Attending: Radiology | Admitting: Radiology

## 2022-07-23 ENCOUNTER — Encounter (HOSPITAL_COMMUNITY): Payer: Self-pay

## 2022-07-23 ENCOUNTER — Ambulatory Visit: Payer: Self-pay | Admitting: Radiation Oncology

## 2022-07-23 DIAGNOSIS — R918 Other nonspecific abnormal finding of lung field: Secondary | ICD-10-CM | POA: Insufficient documentation

## 2022-07-24 ENCOUNTER — Other Ambulatory Visit: Payer: Medicare Other

## 2022-07-24 VITALS — BP 132/54 | HR 72 | Temp 98.7°F

## 2022-07-24 DIAGNOSIS — Z515 Encounter for palliative care: Secondary | ICD-10-CM

## 2022-07-24 NOTE — Progress Notes (Signed)
PATIENT NAME: Brian [name] DOB: 21-Jan-1932 MRN: [id number]  PRIMARY CARE PROVIDER: [name], MD  RESPONSIBLE PARTY:  Acct ID - Guarantor Home Phone Work Phone Relationship Acct Type  [id number] [name], [name] [phone]  Self P/F     [address], [place], [place] [address]    Palliative Care Follow Up Encounter Note    Completed home visit. Daughter [name] also present via phone.        TODAY'S VISIT: lung CA - has a scan on Thursday 6/6; daughter reports pt recently had his prednisone reduced to 15mg  daily   Respiratory: no SOB during this visit; O2 @ 4L/min; gets SOB with exertion; when SOB he changes setting to 6L   Cardiac: no edema; HR regular   Cognitive: Mixed Alzheimer's and vascular Dementia; pt is alert and oriented at this time; pt lives alone and daughter [name] checks in with him daily   Appetite: eats 3 meals per day; drinks adequate amount of fluids; at least 8 cups daily; pt reports he gets cramps in his legs if he doesn't drink enough fluids   GI/GU: no urinary issues; has 2-3 BMs daily   Mobility: able to ambulate independently   ADLs: still cleans his house; cooks, bathes, grooms and dresses independently   Sleeping Pattern: sleeps through the night except to go to bathroom and then goes back to sleep   Pain: denies pain during this visit   Wt: weighed self - 144 lbs  Diabetes: controlled with diet - FSBS 96    Goals of Care: To stay in the home with caregivers when needed     Next Appt Scheduled For: 09/03/22 @ 10:30am       CODE STATUS: Full Code  ADVANCED DIRECTIVES: N MOST FORM: N PPS: 80%   PHYSICAL EXAM:   VITALS: Today's Vitals   07/24/22 1041  BP: (!) 132/54  Pulse: 72  Temp: 98.7 F (37.1 C)  TempSrc: Temporal  SpO2: 98%    LUNGS: clear to auscultation , decreased breath sounds CARDIAC: Cor RRR EXTREMITIES: AROM x4; no edema SKIN: Skin color, texture, turgor normal. No rashes or lesions  NEURO:  negative except for weakness       Payslee Bateson Clementeen Graham, LPN

## 2022-07-25 NOTE — Progress Notes (Signed)
Radiation Oncology         [phone] ________________________________  Name: Brian [name] MRN: [id number]  Date: 07/26/2022  DOB: 1931/12/29  Follow-Up Visit Note  CC: [name], MD  Leslye Peer, MD  No diagnosis found.  Diagnosis: Pet positive Right upper lobe lung nodule, suspicious for primary bronchogenic carcinoma   Interval Since Last Radiation: 2 years, 4 months, and 11 days   Radiation Treatment Dates: 03/09/2020 through 03/16/2020   Site: Right lung Technique: SBRT Total Dose (Gy): 54/54 Dose per Fx (Gy): 18 Completed Fx: 3/3 Beam Energies: 6XFFF  Narrative:  The patient returns today for routine follow-up and to review recent imaging. He was last seen here for follow-up on 01/18/22.   Since his last visit, the patient was hospitalized from 04/02/22 through 04/05/22  for management of an upper GI bleed with acute blood loss anemia after presenting with melena and fatigue. Hospital course included protonix IV, colonoscopy on 02/14 which showed 2 nonbleeding colonic angiodysplastic lesions s/p APC treatment, IV iron, and PRBC transfusion.        He was again hospitalized from 05/08/22 through 05/12/22 for management of acute hypoxic respiratory failure after presenting with worsening fatigue and dyspnea on exertion. He was also found to be profoundly hypoxic with stats in the 70's. This was presumed to be a COPD exacerbation given negative imaging of the chest (see below) and infectious work-up. It is unclear what provoked this event, and he was discharged home with a steroid taper. While admitted, his O2 saturation and symptoms remained stable after being increased to 6L supplemental O2 (which he remains on). Imaging performed while inpatient includes:  -- Chest CT on 03/19 which demonstrated: a chronic small to moderate right pleural effusion with some atelectasis, stable right upper lobe radiation fibrotic changes, a stable 6 mm left upper lobe nodule, and  borderline mediastinal adenopathy.  He has since followed up with Dr. Delton Coombes on 06/07/22. During that visit, the patient endorsed a decline is his respiratory status since finishing his prednisone taper (i.e increased dyspnea and desaturating more often). Subsequently, Dr. Delton Coombes has started him on maintenance prednisone. He will start with 20 mg and will work down to the lowest effective dose gradually. He will otherwise continue with his current regimen of Anoro x 1 puff daily and as needed albuterol. He will also continue with 6L supplemental O2 with the aim of maintaining stats above 90%.    Per his most recent follow-up visit with Dr. Delton Coombes on 07/05/22, the patient reported a significant improvement in his respiratory status and functional status since starting maintenance prednisone. He was accordingly weaned down to 15 mg daily, and started on bactrim (which he will continue for as long as he is on prednisone).      His most recent chest CT without contrast performed on 07/22/21 demonstrated: ***                    Allergies:  is allergic to meperidine hcl, sertraline, gabapentin, memantine, latex, and zolpidem tartrate.  Meds: Current Outpatient Medications  Medication Sig Dispense Refill   ACCU-CHEK COMPACT STRIPS test strip TEST BLOOD SUGAR ONCE DAILY 100 each 1   albuterol (VENTOLIN HFA) 108 (90 Base) MCG/ACT inhaler Inhale 2 puffs into the lungs every 6 (six) hours as needed for wheezing or shortness of breath. 18 g 0   ANORO ELLIPTA 62.5-25 MCG/ACT AEPB USE 1 INHALATION DAILY 240 each 3   Aspirin 81 MG  EC tablet Take 81 mg by mouth daily.     Cholecalciferol (VITAMIN D3) 50 MCG (2000 UT) TABS Take 2,000 Units by mouth daily.     Cyanocobalamin (VITAMIN B12 PO) Take 1 tablet by mouth daily.     enalapril (VASOTEC) 10 MG tablet Take 1 tablet (10 mg total) by mouth 2 (two) times daily. 180 tablet 3   escitalopram (LEXAPRO) 5 MG tablet Take 1 tablet (5 mg total) by mouth daily. 30 tablet 0    ferrous sulfate 325 (65 FE) MG tablet Take 1 tablet (325 mg total) by mouth daily with breakfast. 90 tablet 1   furosemide (LASIX) 40 MG tablet Take 1 tablet (40 mg total) by mouth daily. (Patient taking differently: Take 40 mg by mouth in the morning.) 90 tablet 3   Magnesium Oxide 420 MG TABS Take 420 mg by mouth daily.      memantine (NAMENDA) 10 MG tablet Take 1 tablet (10 mg) twice a day (Patient taking differently: Take 5 mg by mouth in the morning.) 60 tablet 11   mirtazapine (REMERON) 15 MG tablet TAKE 1 TABLET(15 MG) BY MOUTH AT BEDTIME (Patient taking differently: Take 15 mg by mouth at bedtime.) 90 tablet 1   OXYGEN Inhale 5 L/min into the lungs continuous.     predniSONE (DELTASONE) 10 MG tablet TAKE 1 AND 1/2 TABLETS(15 MG) BY MOUTH DAILY WITH BREAKFAST 24 tablet 1   simvastatin (ZOCOR) 40 MG tablet TAKE 1 TABLET DAILY (Patient taking differently: Take 40 mg by mouth daily.) 90 tablet 3   sulfamethoxazole-trimethoprim (BACTRIM DS) 800-160 MG tablet Take 1 tablet by mouth daily. Take 1 tab on Monday, Wednesday and Friday 12 tablet 5   SYSTANE COMPLETE PF 0.6 % SOLN Place 1 drop into both eyes 4 (four) times daily.     triamcinolone cream (KENALOG) 0.1 % Apply 1 application  topically daily as needed (for dryness/itching (affected areas)).     No current facility-administered medications for this encounter.    Physical Findings: The patient is in no acute distress. Patient is alert and oriented.  vitals were not taken for this visit. .  No significant changes. Lungs are clear to auscultation bilaterally. Heart has regular rate and rhythm. No palpable cervical, supraclavicular, or axillary adenopathy. Abdomen soft, non-tender, normal bowel sounds.   Lab Findings: Lab Results  Component Value Date   WBC 7.3 05/12/2022   HGB 9.5 (L) 05/12/2022   HCT 30.2 (L) 05/12/2022   MCV 91.8 05/12/2022   PLT 211 05/12/2022    Radiographic Findings: No results found.  Impression:  Pet  positive Right upper lobe lung nodule, suspicious for primary bronchogenic carcinoma   The patient is recovering from the effects of radiation.  ***  Plan:  ***   *** minutes of total time was spent for this patient encounter, including preparation, face-to-face counseling with the patient and coordination of care, physical exam, and documentation of the encounter. ____________________________________  Billie Lade, PhD, MD  This document serves as a record of services personally performed by Antony Blackbird, MD. It was created on his behalf by Neena Rhymes, a trained medical scribe. The creation of this record is based on the scribe's personal observations and the provider's statements to them. This document has been checked and approved by the attending provider.

## 2022-07-25 NOTE — Progress Notes (Incomplete)
Brian [name] is here today for follow up post radiation to the lung.  Lung Side: Right,patient completed treatment on 03/16/20.  Does the patient complain of any of the following: Pain:*** Shortness of breath w/wo exertion: *** Cough: *** Hemoptysis: *** Pain with swallowing: *** Swallowing/choking concerns: *** Appetite: *** Energy Level: *** Post radiation skin Changes: ***    Additional comments if applicable:

## 2022-07-26 ENCOUNTER — Ambulatory Visit
Admission: RE | Admit: 2022-07-26 | Discharge: 2022-07-26 | Disposition: A | Discharge status: Home / Self Care | Payer: No Typology Code available for payment source | Source: Ambulatory Visit | Attending: Radiation Oncology | Admitting: Radiation Oncology

## 2022-07-26 VITALS — BP 147/74 | HR 70 | Temp 97.8°F | Resp 18 | Ht 71.0 in | Wt 148.1 lb

## 2022-07-26 DIAGNOSIS — R918 Other nonspecific abnormal finding of lung field: Secondary | ICD-10-CM | POA: Insufficient documentation

## 2022-07-26 DIAGNOSIS — Z923 Personal history of irradiation: Secondary | ICD-10-CM | POA: Insufficient documentation

## 2022-07-26 DIAGNOSIS — Z7982 Long term (current) use of aspirin: Secondary | ICD-10-CM | POA: Diagnosis not present

## 2022-07-26 DIAGNOSIS — Z79899 Other long term (current) drug therapy: Secondary | ICD-10-CM | POA: Insufficient documentation

## 2022-07-26 DIAGNOSIS — J432 Centrilobular emphysema: Secondary | ICD-10-CM | POA: Insufficient documentation

## 2022-07-26 DIAGNOSIS — I700 Atherosclerosis of aorta: Secondary | ICD-10-CM | POA: Insufficient documentation

## 2022-07-29 DIAGNOSIS — J439 Emphysema, unspecified: Secondary | ICD-10-CM | POA: Diagnosis not present

## 2022-08-06 ENCOUNTER — Ambulatory Visit (INDEPENDENT_AMBULATORY_CARE_PROVIDER_SITE_OTHER): Payer: No Typology Code available for payment source | Admitting: Primary Care

## 2022-08-06 ENCOUNTER — Encounter: Payer: Self-pay | Admitting: Primary Care

## 2022-08-06 VITALS — BP 118/58 | HR 82 | Temp 97.0°F | Ht 71.0 in | Wt 151.0 lb

## 2022-08-06 DIAGNOSIS — R918 Other nonspecific abnormal finding of lung field: Secondary | ICD-10-CM | POA: Diagnosis not present

## 2022-08-06 DIAGNOSIS — J9611 Chronic respiratory failure with hypoxia: Secondary | ICD-10-CM | POA: Diagnosis not present

## 2022-08-06 DIAGNOSIS — I509 Heart failure, unspecified: Secondary | ICD-10-CM | POA: Diagnosis not present

## 2022-08-06 NOTE — Assessment & Plan Note (Addendum)
-   Stable; He has been requiring less oxygen recently. Continue supplemental oxygen 4-6L/min 24/L to maintain O2>88-90%

## 2022-08-06 NOTE — Assessment & Plan Note (Addendum)
-   Breathing continues to improve with daily prednisone. Weaning oral steroid as tolerates. Recommend decreasing to 10mg  daily x 6 weeks, then 5mg  daily until follow-up. Continue Anoro Ellipta one puff daily and Bactrim MWF while on prednisone. FU in 3 months or sooner if needed.

## 2022-08-06 NOTE — Assessment & Plan Note (Addendum)
-   Stable; CT imaging on chest in June showed no evidence of disease recurrence, stable radiation fibrosis with central nodular component RUL measuring 1cm. Remains on steroids for pneumonitis related to XRT. Cough and dyspnea have improved.

## 2022-08-06 NOTE — Progress Notes (Signed)
@Patient  ID: Brian [name], male    DOB: 1932-02-15, 89 y.o.   MRN: [id number]  Chief Complaint  Patient presents with   Follow-up    Referring provider: [name], MD  HPI: 89 year old male, former smoker.  Past medical history significant for hypertension, aortic arthrosclerosis, diastolic heart failure, second-degree heart block, vascular dementia, COPD, chronic respiratory failure, pleural effusion due to CHF, pulmonary nodules.  Patient of Dr. Delton Coombes, last seen on 07/05/22.  08/06/2022 Patient presents today for follow-up with his son. Patient of Dr. Delton Coombes, last seen in office on May 16. During that visit prednisone was decreased to 15 mg daily and it was recommended to wean as tolerates.  Started on prophylactic Bactrim.  Maintained on Anoro and as needed albuterol.  He is doing well today. Breathing continues to improve. No acute respiratory symptoms. Shortness of breath is no worse since decreasing prednisone. Denies cough. He is using less oxygen, chronically on 4L 24/7.   He had a CT of his chest in June that showed no evidence of disease recurrence.  Stable radiation fibrosis with central nodular component measuring up to 1 cm right upper lobe.  Stable left upper lobe pulmonary nodules.  Advanced centrilobular emphysema.  Resolution of previous right-sided pleural effusion.  Allergies  Allergen Reactions   Meperidine Hcl Swelling and Other (See Comments)    Tongue swelling Because of a history of documented adverse serious drug reaction, Medi Alert bracelet  is recommended.   Sertraline Shortness Of Breath and Other (See Comments)    Burning sensation from feet to head, diff breathing   Gabapentin Other (See Comments)    Confusion    Memantine Other (See Comments)    Can take only up to 5 mg a day- causes lethargy past that amount   Latex Rash and Other (See Comments)    Minor rash   Zolpidem Tartrate Other (See Comments)    Nightmares    Immunization History   Administered Date(s) Administered   Covid-19, Mrna,Vaccine(Spikevax)64yrs and older 04/26/2022   Fluad Quad(high Dose 65+) 10/15/2018, 12/02/2019, 10/19/2020   Influenza Split 12/21/2010, 12/05/2011   Influenza Whole 01/01/2007, 12/09/2008, 11/24/2009   Influenza, High Dose Seasonal PF 11/08/2015, 11/22/2017, 11/13/2021   Influenza,inj,Quad PF,6+ Mos 11/20/2012, 10/29/2013, 11/23/2014   Influenza-Unspecified 12/11/2016, 10/21/2019, 10/19/2020   Moderna Covid-19 Vaccine Bivalent Booster 70yrs & up 11/17/2020   Moderna Sars-Covid-2 Vaccination 03/15/2019, 04/12/2019, 12/24/2019, 06/10/2020   Pneumococcal Conjugate-13 03/10/2014, 03/09/2015   Pneumococcal Polysaccharide-23 02/20/2008   Pneumococcal-Unspecified 10/21/2002, 04/28/2008   Td 07/16/2007, 04/25/2021   Td (Adult),5 Lf Tetanus Toxid, Preservative Free 04/25/2021   Tdap 02/26/2011   Zoster Recombinat (Shingrix) 10/03/2016, 12/02/2016   Zoster, Live 07/16/2007    Past Medical History:  Diagnosis Date   AAA (abdominal aortic aneurysm) (HCC)    Arthritis    Asthma 1976   Bronchiolitis 06/2014   CAD (coronary artery disease)    NSTEMI 02/2010;  s/p CABG 1/12 (L-LAD, SOM1, S-PDA); echo in 08/2010: Mild LVH, EF 55%, grade 2 diastolic dysfunction, mild MR, mild LAE, mildly reduced RV function, mild RAE, PASP 42   Carpal tunnel syndrome of left wrist    Cecal angiodysplasia 04/04/2022   Colon polyps    COPD (chronic obstructive pulmonary disease) (HCC)    Diabetes mellitus 2000   Type II, controlls with diet   Dyspnea    ED (erectile dysfunction)    Gout    History of radiation therapy 03/09/2020-03/16/2020   SBRT right lung: Dr. Fayrene Fearing  [place]   HTN (hypertension)    Hyperlipidemia    Inguinal hernia    Right   Macular degeneration    Myocardial infarction (HCC) 1 -13-2012   Neuropathy     Tobacco History: Social History   Tobacco Use  Smoking Status Former   Packs/day: 1.00   Years: 40.00   Additional pack years:  0.00   Total pack years: 40.00   Types: Cigarettes   Quit date: 02/20/2003   Years since quitting: 19.4  Smokeless Tobacco Never   Counseling given: Not Answered   Outpatient Medications Prior to Visit  Medication Sig Dispense Refill   ACCU-CHEK COMPACT STRIPS test strip TEST BLOOD SUGAR ONCE DAILY 100 each 1   albuterol (VENTOLIN HFA) 108 (90 Base) MCG/ACT inhaler Inhale 2 puffs into the lungs every 6 (six) hours as needed for wheezing or shortness of breath. 18 g 0   ANORO ELLIPTA 62.5-25 MCG/ACT AEPB USE 1 INHALATION DAILY 240 each 3   Aspirin 81 MG EC tablet Take 81 mg by mouth daily.     Cholecalciferol (VITAMIN D3) 50 MCG (2000 UT) TABS Take 2,000 Units by mouth daily.     Cyanocobalamin (VITAMIN B12 PO) Take 1 tablet by mouth daily.     enalapril (VASOTEC) 10 MG tablet Take 1 tablet (10 mg total) by mouth 2 (two) times daily. 180 tablet 3   escitalopram (LEXAPRO) 5 MG tablet Take 1 tablet (5 mg total) by mouth daily. 30 tablet 0   ferrous sulfate 325 (65 FE) MG tablet Take 1 tablet (325 mg total) by mouth daily with breakfast. 90 tablet 1   furosemide (LASIX) 40 MG tablet Take 1 tablet (40 mg total) by mouth daily. (Patient taking differently: Take 40 mg by mouth in the morning.) 90 tablet 3   Magnesium Oxide 420 MG TABS Take 420 mg by mouth daily.      memantine (NAMENDA) 10 MG tablet Take 1 tablet (10 mg) twice a day (Patient taking differently: Take 5 mg by mouth in the morning.) 60 tablet 11   mirtazapine (REMERON) 15 MG tablet TAKE 1 TABLET(15 MG) BY MOUTH AT BEDTIME (Patient taking differently: Take 15 mg by mouth at bedtime.) 90 tablet 1   OXYGEN Inhale 5 L/min into the lungs continuous.     predniSONE (DELTASONE) 10 MG tablet TAKE 1 AND 1/2 TABLETS(15 MG) BY MOUTH DAILY WITH BREAKFAST 24 tablet 1   simvastatin (ZOCOR) 40 MG tablet TAKE 1 TABLET DAILY (Patient taking differently: Take 40 mg by mouth daily.) 90 tablet 3   sulfamethoxazole-trimethoprim (BACTRIM DS) 800-160 MG  tablet Take 1 tablet by mouth daily. Take 1 tab on Monday, Wednesday and Friday 12 tablet 5   SYSTANE COMPLETE PF 0.6 % SOLN Place 1 drop into both eyes 4 (four) times daily.     triamcinolone cream (KENALOG) 0.1 % Apply 1 application  topically daily as needed (for dryness/itching (affected areas)).     No facility-administered medications prior to visit.   Review of Systems  Review of Systems  Constitutional: Negative.   HENT: Negative.    Respiratory:  Negative for cough, chest tightness, shortness of breath and wheezing.    Physical Exam  BP (!) 118/58 (BP Location: Left Arm, Patient Position: Sitting, Cuff Size: Normal)   Pulse 82   Temp (!) 97 F (36.1 C) (Temporal)   Ht 5\' 11"  (1.803 m)   Wt 151 lb (68.5 kg)   SpO2 95%   BMI 21.06 kg/m  Physical  Exam Constitutional:      Appearance: Normal appearance.  HENT:     Head: Normocephalic and atraumatic.     Mouth/Throat:     Mouth: Mucous membranes are moist.     Pharynx: Oropharynx is clear.  Cardiovascular:     Rate and Rhythm: Normal rate and regular rhythm.  Pulmonary:     Effort: Pulmonary effort is normal.     Breath sounds: Normal breath sounds. No wheezing, rhonchi or rales.  Musculoskeletal:        General: Normal range of motion.  Skin:    General: Skin is warm and dry.  Neurological:     General: No focal deficit present.     Mental Status: He is alert and oriented to person, place, and time. Mental status is at baseline.  Psychiatric:        Mood and Affect: Mood normal.        Behavior: Behavior normal.        Thought Content: Thought content normal.        Judgment: Judgment normal.      Lab Results:  CBC    Component Value Date/Time   WBC 7.3 05/12/2022 0452   RBC 3.29 (L) 05/12/2022 0452   HGB 9.5 (L) 05/12/2022 0452   HCT 30.2 (L) 05/12/2022 0452   PLT 211 05/12/2022 0452   MCV 91.8 05/12/2022 0452   MCH 28.9 05/12/2022 0452   MCHC 31.5 05/12/2022 0452   RDW 16.9 (H) 05/12/2022 0452    LYMPHSABS 0.9 05/08/2022 1335   MONOABS 0.4 05/08/2022 1335   EOSABS 0.3 05/08/2022 1335   BASOSABS 0.0 05/08/2022 1335    BMET    Component Value Date/Time   NA 135 05/12/2022 0452   NA 143 10/10/2021 1054   K 4.8 05/12/2022 0452   CL 101 05/12/2022 0452   CO2 26 05/12/2022 0452   GLUCOSE 161 (H) 05/12/2022 0452   BUN 55 (H) 05/12/2022 0452   BUN 28 (H) 10/10/2021 1054   CREATININE 2.07 (H) 05/12/2022 0452   CREATININE 1.58 (H) 10/15/2019 1117   CALCIUM 8.1 (L) 05/12/2022 0452   CALCIUM 9.0 03/16/2016 0000   GFRNONAA 30 (L) 05/12/2022 0452   GFRNONAA 39 (L) 10/15/2019 1117   GFRAA 46 (L) 10/15/2019 1117    BNP    Component Value Date/Time   BNP 411.7 (H) 05/08/2022 1335    ProBNP No results found for: "PROBNP"  Imaging: CT Chest Wo Contrast  Result Date: 07/26/2022 CLINICAL DATA:  Lung nodules, follow-up EXAM: CT CHEST WITHOUT CONTRAST TECHNIQUE: Multidetector CT imaging of the chest was performed following the standard protocol without IV contrast. RADIATION DOSE REDUCTION: This exam was performed according to the departmental dose-optimization program which includes automated exposure control, adjustment of the mA and/or kV according to patient size and/or use of iterative reconstruction technique. COMPARISON:  Prior CT scan of the chest 05/08/2022; 01/10/2022 FINDINGS: Cardiovascular: Limited evaluation in the absence of intravenous contrast. Two vessel aortic arch. The right brachiocephalic and left common carotid artery share a common origin. Extensive calcifications throughout the thoracic aorta. Enlarged central and main pulmonary artery. The main pulmonary artery measures up to 3.7 cm. Calcifications present throughout the coronary arteries. Patient is status post median sternotomy with evidence of prior multivessel CABG. The heart is normal in size. No pericardial effusion. Mediastinum/Nodes: Unremarkable CT appearance of the thyroid gland. No suspicious mediastinal  or hilar adenopathy. No soft tissue mediastinal mass. The thoracic esophagus is unremarkable. Lungs/Pleura: Advanced centrilobular  pulmonary emphysema. Focal architectural distortion consistent with radiation fibrosis in the periphery of the right upper lobe. Central nodular component remains stable at 1 cm. Stable 6 mm nodule in the central left upper lobe. Nodule in the periphery of the left upper lobe measures 3 mm and demonstrates no significant interval change (image 114 series 8). Small left Bochdalek's hernia. No new nodules or suspicious findings. The previously identified right-sided pleural effusion has resolved. Upper Abdomen: No acute abnormality. Partially imaged abdominal aortic endograft. Musculoskeletal: No chest wall mass or suspicious bone lesions identified. Healed median sternotomy. IMPRESSION: 1. No evidence of recurrent disease. 2. Stable changes of radiation fibrosis with central nodular component measuring up to 1 cm in the periphery of the right upper lobe. 3. Stable left upper lobe pulmonary nodules. 4. Advanced centrilobular emphysema and atherosclerotic vascular calcifications. 5. Interval resolution of previously identified right-sided pleural effusion. 6. Enlarged main and central pulmonary arteries consistent with pulmonary arterial hypertension. Aortic Atherosclerosis (ICD10-I70.0) and Emphysema (ICD10-J43.9). Electronically Signed   By: Malachy Moan M.D.   On: 07/26/2022 08:22     Assessment & Plan:   Pleural effusion due to CHF (congestive heart failure) (HCC) - Resolved on CT imaging   COPD (chronic obstructive pulmonary disease) (HCC) - Breathing continues to improve with daily prednisone. Weaning oral steroid as tolerates. Recommend decreasing to 10mg  daily x 6 weeks, then 5mg  daily until follow-up. Continue Anoro Ellipta one puff daily and Bactrim MWF while on prednisone. FU in 3 months or sooner if needed.   Pulmonary nodules - Stable; CT imaging on chest in  June showed no evidence of disease recurrence, stable radiation fibrosis with central nodular component RUL measuring 1cm. Remains on steroids for pneumonitis related to XRT. Cough and dyspnea have improved.   Chronic respiratory failure with hypoxia (HCC) - Stable; He has been requiring less oxygen recently. Continue supplemental oxygen 4-6L/min 24/L to maintain O2>88-90%   Glenford Bayley, NP 08/06/2022

## 2022-08-06 NOTE — Patient Instructions (Addendum)
CT chest showed resolution of pleural effusion, stable radiation fibrosis.  Advanced emphysema.  Stable pulmonary nodules  Recommend: - Decrease prednisone 10mg  x 6 weeks. First week of August, if tolerating ok continue wean 5mg  until follow-up  - Continue Bactrim while on prednisone  - Continue Anoro one puff daily - Continue oxygen 4L to maintain O2 >90%  Follow-up: - 3 months with Dr. Delton Coombes

## 2022-08-06 NOTE — Assessment & Plan Note (Signed)
Resolved on CT imaging.

## 2022-08-12 DIAGNOSIS — J439 Emphysema, unspecified: Secondary | ICD-10-CM | POA: Diagnosis not present

## 2022-08-16 ENCOUNTER — Other Ambulatory Visit: Payer: Self-pay | Admitting: Emergency Medicine

## 2022-08-17 ENCOUNTER — Other Ambulatory Visit: Payer: Self-pay | Admitting: Internal Medicine

## 2022-08-17 MED ORDER — ESCITALOPRAM OXALATE 5 MG PO TABS: 5.0000 mg | ORAL_TABLET | Freq: Every day | ORAL | 5 refills | Status: DC

## 2022-08-27 ENCOUNTER — Encounter: Payer: Self-pay | Admitting: Emergency Medicine

## 2022-08-27 NOTE — Telephone Encounter (Signed)
Yes I think we should go back to 15mg 

## 2022-08-28 DIAGNOSIS — J439 Emphysema, unspecified: Secondary | ICD-10-CM | POA: Diagnosis not present

## 2022-09-03 ENCOUNTER — Other Ambulatory Visit: Payer: Medicare Other

## 2022-09-11 ENCOUNTER — Encounter: Payer: Self-pay | Admitting: Emergency Medicine

## 2022-09-11 DIAGNOSIS — J9611 Chronic respiratory failure with hypoxia: Secondary | ICD-10-CM

## 2022-09-11 DIAGNOSIS — J439 Emphysema, unspecified: Secondary | ICD-10-CM

## 2022-09-11 DIAGNOSIS — I509 Heart failure, unspecified: Secondary | ICD-10-CM

## 2022-09-11 DIAGNOSIS — R918 Other nonspecific abnormal finding of lung field: Secondary | ICD-10-CM

## 2022-09-18 NOTE — Progress Notes (Signed)
This encounter was created in error - please disregard. I called patient and his daughter answered, to let me know that she has to cancel Brian [name] appointment for today and will call back to reschedule him.

## 2022-09-21 ENCOUNTER — Ambulatory Visit: Payer: No Typology Code available for payment source | Admitting: Internal Medicine

## 2022-09-27 ENCOUNTER — Encounter: Payer: Self-pay | Admitting: Internal Medicine

## 2022-09-27 NOTE — Progress Notes (Signed)
Subjective:    Patient ID: Brian [name], male    DOB: Apr 18, 1931, 89 y.o.   MRN: [id number]     HPI Samuele is here for follow up of his chronic medical problems.  4 L O2 at rest 6 L with exertion - still on 15 mg prednisone.  Overall doing well.  He is sleeping well and his appetite is very good.  The prednisone is helping with the appetite.  Medications and allergies reviewed with patient and updated if appropriate.  Current Outpatient Medications on File Prior to Visit  Medication Sig Dispense Refill   ACCU-CHEK COMPACT STRIPS test strip TEST BLOOD SUGAR ONCE DAILY 100 each 1   albuterol (VENTOLIN HFA) 108 (90 Base) MCG/ACT inhaler Inhale 2 puffs into the lungs every 6 (six) hours as needed for wheezing or shortness of breath. 18 g 0   ANORO ELLIPTA 62.5-25 MCG/ACT AEPB USE 1 INHALATION DAILY 240 each 3   Aspirin 81 MG EC tablet Take 81 mg by mouth daily.     Cholecalciferol (VITAMIN D3) 50 MCG (2000 UT) TABS Take 2,000 Units by mouth daily.     Cyanocobalamin (VITAMIN B12 PO) Take 1 tablet by mouth daily.     enalapril (VASOTEC) 10 MG tablet Take 1 tablet (10 mg total) by mouth 2 (two) times daily. 180 tablet 3   escitalopram (LEXAPRO) 5 MG tablet Take 1 tablet (5 mg total) by mouth daily. 30 tablet 5   ferrous sulfate 325 (65 FE) MG tablet Take 1 tablet (325 mg total) by mouth daily with breakfast. 90 tablet 1   furosemide (LASIX) 40 MG tablet Take 1 tablet (40 mg total) by mouth daily. (Patient taking differently: Take 40 mg by mouth in the morning.) 90 tablet 3   Magnesium Oxide 420 MG TABS Take 420 mg by mouth daily.      memantine (NAMENDA) 10 MG tablet Take 1 tablet (10 mg) twice a day (Patient taking differently: Take 5 mg by mouth in the morning.) 60 tablet 11   mirtazapine (REMERON) 15 MG tablet TAKE 1 TABLET(15 MG) BY MOUTH AT BEDTIME (Patient taking differently: Take 15 mg by mouth at bedtime.) 90 tablet 1   OXYGEN Inhale 5 L/min into the lungs continuous.      predniSONE (DELTASONE) 10 MG tablet TAKE 2 TABLETS(20 MG) BY MOUTH DAILY 60 tablet 11   simvastatin (ZOCOR) 40 MG tablet TAKE 1 TABLET DAILY (Patient taking differently: Take 40 mg by mouth daily.) 90 tablet 3   sulfamethoxazole-trimethoprim (BACTRIM DS) 800-160 MG tablet Take 1 tablet by mouth daily. Take 1 tab on Monday, Wednesday and Friday 12 tablet 5   SYSTANE COMPLETE PF 0.6 % SOLN Place 1 drop into both eyes 4 (four) times daily.     triamcinolone cream (KENALOG) 0.1 % Apply 1 application  topically daily as needed (for dryness/itching (affected areas)).     No current facility-administered medications on file prior to visit.     Review of Systems  Constitutional:  Negative for appetite change and fever.  HENT:  Positive for rhinorrhea.   Respiratory:  Negative for cough, shortness of breath and wheezing.   Cardiovascular:  Negative for chest pain, palpitations and leg swelling.  Neurological:  Negative for light-headedness and headaches.       Objective:   Vitals:   09/28/22 1023  BP: (!) 116/58  Pulse: 68  Temp: 98.3 F (36.8 C)  SpO2: 92%   BP Readings from Last 3  Encounters:  09/28/22 (!) 116/58  08/06/22 (!) 118/58  07/26/22 (!) 147/74   Wt Readings from Last 3 Encounters:  09/28/22 154 lb (69.9 kg)  08/06/22 151 lb (68.5 kg)  07/26/22 148 lb 2 oz (67.2 kg)   Body mass index is 21.48 kg/m.    Physical Exam Constitutional:      General: He is not in acute distress.    Appearance: He is not ill-appearing.     Comments: Wearing oxygen via nasal cannula  HENT:     Head: Normocephalic and atraumatic.  Eyes:     Conjunctiva/sclera: Conjunctivae normal.  Cardiovascular:     Rate and Rhythm: Normal rate and regular rhythm.     Heart sounds: Normal heart sounds.  Pulmonary:     Effort: Pulmonary effort is normal. No respiratory distress.     Breath sounds: Normal breath sounds. No wheezing or rales.  Musculoskeletal:     Right lower leg: No edema.      Left lower leg: No edema.  Skin:    General: Skin is warm and dry.     Findings: No rash.  Neurological:     Mental Status: He is alert. Mental status is at baseline.  Psychiatric:        Mood and Affect: Mood normal.        Lab Results  Component Value Date   WBC 7.3 05/12/2022   HGB 9.5 (L) 05/12/2022   HCT 30.2 (L) 05/12/2022   PLT 211 05/12/2022   GLUCOSE 161 (H) 05/12/2022   CHOL 123 03/30/2022   TRIG 98.0 03/30/2022   HDL 47.70 03/30/2022   LDLCALC 56 03/30/2022   ALT 12 05/08/2022   AST 18 05/08/2022   NA 135 05/12/2022   K 4.8 05/12/2022   CL 101 05/12/2022   CREATININE 2.07 (H) 05/12/2022   BUN 55 (H) 05/12/2022   CO2 26 05/12/2022   TSH 1.77 10/04/2021   PSA 1.42 07/16/2007   INR 1.2 01/21/2019   HGBA1C 6.5 03/30/2022   MICROALBUR 9.45 03/19/2018     Assessment & Plan:    See Problem List for Assessment and Plan of chronic medical problems.

## 2022-09-27 NOTE — Patient Instructions (Addendum)
      Blood work was ordered.   The lab is on the first floor.    Medications changes include :   none    Return in about 6 months (around 03/31/2023) for follow up.

## 2022-09-28 ENCOUNTER — Ambulatory Visit (INDEPENDENT_AMBULATORY_CARE_PROVIDER_SITE_OTHER): Payer: Medicare Other | Admitting: Internal Medicine

## 2022-09-28 ENCOUNTER — Other Ambulatory Visit: Payer: Self-pay | Admitting: Internal Medicine

## 2022-09-28 VITALS — BP 116/58 | HR 68 | Temp 98.3°F | Ht 71.0 in | Wt 154.0 lb

## 2022-09-28 DIAGNOSIS — E782 Mixed hyperlipidemia: Secondary | ICD-10-CM | POA: Diagnosis not present

## 2022-09-28 DIAGNOSIS — I5032 Chronic diastolic (congestive) heart failure: Secondary | ICD-10-CM | POA: Diagnosis not present

## 2022-09-28 DIAGNOSIS — N182 Chronic kidney disease, stage 2 (mild): Secondary | ICD-10-CM | POA: Diagnosis not present

## 2022-09-28 DIAGNOSIS — G479 Sleep disorder, unspecified: Secondary | ICD-10-CM

## 2022-09-28 DIAGNOSIS — I10 Essential (primary) hypertension: Secondary | ICD-10-CM | POA: Diagnosis not present

## 2022-09-28 DIAGNOSIS — N1832 Chronic kidney disease, stage 3b: Secondary | ICD-10-CM

## 2022-09-28 DIAGNOSIS — F419 Anxiety disorder, unspecified: Secondary | ICD-10-CM

## 2022-09-28 DIAGNOSIS — F0150 Vascular dementia without behavioral disturbance: Secondary | ICD-10-CM

## 2022-09-28 DIAGNOSIS — F0280 Dementia in other diseases classified elsewhere without behavioral disturbance: Secondary | ICD-10-CM

## 2022-09-28 DIAGNOSIS — R413 Other amnesia: Secondary | ICD-10-CM

## 2022-09-28 DIAGNOSIS — G309 Alzheimer's disease, unspecified: Secondary | ICD-10-CM

## 2022-09-28 DIAGNOSIS — J439 Emphysema, unspecified: Secondary | ICD-10-CM | POA: Diagnosis not present

## 2022-09-28 DIAGNOSIS — E1122 Type 2 diabetes mellitus with diabetic chronic kidney disease: Secondary | ICD-10-CM | POA: Diagnosis not present

## 2022-09-28 DIAGNOSIS — J9621 Acute and chronic respiratory failure with hypoxia: Secondary | ICD-10-CM

## 2022-09-28 LAB — COMPREHENSIVE METABOLIC PANEL
ALT: 12 U/L (ref 0–53)
AST: 15 U/L (ref 0–37)
Albumin: 4.4 g/dL (ref 3.5–5.2)
Alkaline Phosphatase: 51 U/L (ref 39–117)
BUN: 63 mg/dL — ABNORMAL HIGH (ref 6–23)
CO2: 27 mEq/L (ref 19–32)
Calcium: 9.7 mg/dL (ref 8.4–10.5)
Chloride: 102 mEq/L (ref 96–112)
Creatinine, Ser: 3.11 mg/dL — ABNORMAL HIGH (ref 0.40–1.50)
GFR: 17.2 mL/min — ABNORMAL LOW (ref >60.00)
Glucose, Bld: 139 mg/dL — ABNORMAL HIGH (ref 70–99)
Potassium: 6 mEq/L — ABNORMAL HIGH (ref 3.5–5.1)
Sodium: 136 mEq/L (ref 135–145)
Total Bilirubin: 0.3 mg/dL (ref 0.2–1.2)
Total Protein: 6.8 g/dL (ref 6.0–8.3)

## 2022-09-28 LAB — CBC WITH DIFFERENTIAL/PLATELET
Basophils Absolute: 0 10*3/uL (ref 0.0–0.1)
Basophils Relative: 0.3 % (ref 0.0–3.0)
Eosinophils Absolute: 0 10*3/uL (ref 0.0–0.7)
Eosinophils Relative: 0.2 % (ref 0.0–5.0)
HCT: 31.9 % — ABNORMAL LOW (ref 39.0–52.0)
Hemoglobin: 10.2 g/dL — ABNORMAL LOW (ref 13.0–17.0)
Lymphocytes Relative: 12.1 % (ref 12.0–46.0)
Lymphs Abs: 0.8 10*3/uL (ref 0.7–4.0)
MCHC: 32.1 g/dL (ref 30.0–36.0)
MCV: 97.6 fl (ref 78.0–100.0)
Monocytes Absolute: 0.3 10*3/uL (ref 0.1–1.0)
Monocytes Relative: 4.9 % (ref 3.0–12.0)
Neutro Abs: 5.6 10*3/uL (ref 1.4–7.7)
Neutrophils Relative %: 82.5 % — ABNORMAL HIGH (ref 43.0–77.0)
Platelets: 161 10*3/uL (ref 150.0–400.0)
RBC: 3.27 Mil/uL — ABNORMAL LOW (ref 4.22–5.81)
RDW: 15.4 % (ref 11.5–15.5)
WBC: 6.8 10*3/uL (ref 4.0–10.5)

## 2022-09-28 LAB — LIPID PANEL
Cholesterol: 165 mg/dL (ref 0–200)
HDL: 64.8 mg/dL (ref >39.00)
LDL Cholesterol: 69 mg/dL (ref 0–99)
NonHDL: 100.36
Total CHOL/HDL Ratio: 3
Triglycerides: 156 mg/dL — ABNORMAL HIGH (ref 0.0–149.0)
VLDL: 31.2 mg/dL (ref 0.0–40.0)

## 2022-09-28 LAB — HEMOGLOBIN A1C: Hgb A1c MFr Bld: 6.8 % — ABNORMAL HIGH (ref 4.6–6.5)

## 2022-09-28 LAB — TSH: TSH: 1.24 u[IU]/mL (ref 0.35–5.50)

## 2022-09-28 MED ORDER — FERROUS SULFATE 325 (65 FE) MG PO TABS: 325.0000 mg | ORAL_TABLET | Freq: Every day | ORAL | 1 refills | Status: AC

## 2022-09-28 MED ORDER — MIRTAZAPINE 15 MG PO TABS: 15.0000 mg | ORAL_TABLET | Freq: Every day | ORAL | 1 refills | Status: DC

## 2022-09-28 NOTE — Assessment & Plan Note (Signed)
Chronic CMP, lipid panel Continue simvastatin 40 mg daily

## 2022-09-28 NOTE — Assessment & Plan Note (Signed)
Chronic Appears euvolemic Continue Lasix 40 mg daily

## 2022-09-28 NOTE — Assessment & Plan Note (Signed)
Chronic Following with neurology Memory stable Continue Namenda 5 mg every morning

## 2022-09-28 NOTE — Assessment & Plan Note (Signed)
Chronic Blood pressure well-controlled Continue enalapril 10 mg twice daily CMP, CBC

## 2022-09-28 NOTE — Assessment & Plan Note (Signed)
Chronic   Lab Results  Component Value Date   HGBA1C 6.8 (H) 09/28/2022   Sugars controlled Testing sugars 1 times a day Continue lifestyle control

## 2022-09-28 NOTE — Assessment & Plan Note (Signed)
Chronic CMP, CBC Euvolemic Continue Lasix 40 mg daily No longer on Jardiance

## 2022-09-28 NOTE — Assessment & Plan Note (Signed)
Chronic Anxiety controlled-some of his panic attacks that he was experiencing were likely issues with breathing related to his COPD Continue Lexapro 5 mg daily, Remeron 15 mg at bedtime

## 2022-09-28 NOTE — Assessment & Plan Note (Signed)
Chronic Sleep overall controlled Continue mirtazapine 15 mg at bedtime

## 2022-09-29 NOTE — Addendum Note (Signed)
Addended by: [name] on: 09/29/2022 04:44 PM   Modules accepted: Orders

## 2022-10-12 ENCOUNTER — Other Ambulatory Visit (INDEPENDENT_AMBULATORY_CARE_PROVIDER_SITE_OTHER): Payer: Medicare Other

## 2022-10-12 DIAGNOSIS — I5032 Chronic diastolic (congestive) heart failure: Secondary | ICD-10-CM

## 2022-10-12 DIAGNOSIS — J439 Emphysema, unspecified: Secondary | ICD-10-CM | POA: Diagnosis not present

## 2022-10-12 LAB — BASIC METABOLIC PANEL
BUN: 42 mg/dL — ABNORMAL HIGH (ref 6–23)
CO2: 25 mEq/L (ref 19–32)
Calcium: 8.9 mg/dL (ref 8.4–10.5)
Chloride: 104 mEq/L (ref 96–112)
Creatinine, Ser: 2.44 mg/dL — ABNORMAL HIGH (ref 0.40–1.50)
GFR: 23.01 mL/min — ABNORMAL LOW (ref >60.00)
Glucose, Bld: 157 mg/dL — ABNORMAL HIGH (ref 70–99)
Potassium: 5.8 mEq/L — ABNORMAL HIGH (ref 3.5–5.1)
Sodium: 137 mEq/L (ref 135–145)

## 2022-10-13 ENCOUNTER — Other Ambulatory Visit: Payer: Self-pay | Admitting: Internal Medicine

## 2022-10-13 MED ORDER — ENALAPRIL MALEATE 10 MG PO TABS: 10.0000 mg | ORAL_TABLET | Freq: Every day | ORAL | Status: DC

## 2022-10-23 ENCOUNTER — Encounter: Payer: Self-pay | Admitting: Emergency Medicine

## 2022-11-01 ENCOUNTER — Encounter: Payer: Self-pay | Admitting: Internal Medicine

## 2022-11-02 ENCOUNTER — Ambulatory Visit: Payer: No Typology Code available for payment source | Admitting: Internal Medicine

## 2022-11-05 DIAGNOSIS — E875 Hyperkalemia: Secondary | ICD-10-CM | POA: Insufficient documentation

## 2022-11-05 NOTE — Patient Instructions (Incomplete)
Blood work was ordered.   The lab is on the first floor.    Medications changes include :   none     Return for follow up as scheduled.

## 2022-11-05 NOTE — Progress Notes (Unsigned)
Subjective:    Patient ID: Brian [name], male    DOB: 1931-04-17, 89 y.o.   MRN: [id number]     HPI Brian [name] is here for follow up of his chronic medical problems.  End of August - decreased enalapril to once a day because K, BUN, Cr were elevated.    Taking lasix alternating 40 mg and 20 mg Taking enalapril once a day  No leg swelling  Has leg cramping at night - Takes magnesium daily  Medications and allergies reviewed with patient and updated if appropriate.  Current Outpatient Medications on File Prior to Visit  Medication Sig Dispense Refill   ACCU-CHEK COMPACT STRIPS test strip TEST BLOOD SUGAR ONCE DAILY 100 each 1   albuterol (VENTOLIN HFA) 108 (90 Base) MCG/ACT inhaler Inhale 2 puffs into the lungs every 6 (six) hours as needed for wheezing or shortness of breath. 18 g 0   ANORO ELLIPTA 62.5-25 MCG/ACT AEPB USE 1 INHALATION DAILY 240 each 3   Aspirin 81 MG EC tablet Take 81 mg by mouth daily.     Cholecalciferol (VITAMIN D3) 50 MCG (2000 UT) TABS Take 2,000 Units by mouth daily.     Cyanocobalamin (VITAMIN B12 PO) Take 1 tablet by mouth daily.     enalapril (VASOTEC) 10 MG tablet Take 1 tablet (10 mg total) by mouth daily.     escitalopram (LEXAPRO) 10 MG tablet Take by mouth.     ferrous sulfate 325 (65 FE) MG tablet Take 1 tablet (325 mg total) by mouth daily with breakfast. 90 tablet 1   furosemide (LASIX) 40 MG tablet Take 1 tablet (40 mg total) by mouth daily. (Patient taking differently: Take 40 mg by mouth in the morning.) 90 tablet 3   Magnesium Oxide 420 MG TABS Take 420 mg by mouth daily.      memantine (NAMENDA) 10 MG tablet Take 1 tablet (10 mg) twice a day (Patient taking differently: Take 5 mg by mouth in the morning.) 60 tablet 11   mirtazapine (REMERON) 15 MG tablet Take 1 tablet (15 mg total) by mouth at bedtime. 90 tablet 1   OXYGEN Inhale 5 L/min into the lungs continuous.     predniSONE (DELTASONE) 10 MG tablet TAKE 2 TABLETS(20 MG) BY MOUTH  DAILY 60 tablet 11   simvastatin (ZOCOR) 40 MG tablet TAKE 1 TABLET DAILY (Patient taking differently: Take 40 mg by mouth daily.) 90 tablet 3   sulfamethoxazole-trimethoprim (BACTRIM DS) 800-160 MG tablet Take 1 tablet by mouth daily. Take 1 tab on Monday, Wednesday and Friday 12 tablet 5   SYSTANE COMPLETE PF 0.6 % SOLN Place 1 drop into both eyes 4 (four) times daily.     triamcinolone cream (KENALOG) 0.1 % Apply 1 application  topically daily as needed (for dryness/itching (affected areas)).     No current facility-administered medications on file prior to visit.     Review of Systems  Constitutional:  Negative for appetite change.  Respiratory:  Positive for shortness of breath (chronic - stable).   Cardiovascular:  Negative for leg swelling.  Psychiatric/Behavioral:  Negative for sleep disturbance.        Objective:   Vitals:   11/06/22 1009  BP: 124/62  Pulse: 81  Temp: 98.2 F (36.8 C)  SpO2: 90%   BP Readings from Last 3 Encounters:  11/06/22 124/62  09/28/22 (!) 116/58  08/06/22 (!) 118/58   Wt Readings from Last 3 Encounters:  11/06/22 162 lb 12.8  oz (73.8 kg)  09/28/22 154 lb (69.9 kg)  08/06/22 151 lb (68.5 kg)   Body mass index is 22.71 kg/m.    Physical Exam Constitutional:      General: He is not in acute distress.    Appearance: Normal appearance. He is not ill-appearing.  HENT:     Head: Normocephalic and atraumatic.  Eyes:     Conjunctiva/sclera: Conjunctivae normal.  Cardiovascular:     Rate and Rhythm: Normal rate and regular rhythm.     Heart sounds: Normal heart sounds.  Pulmonary:     Effort: Pulmonary effort is normal. No respiratory distress.     Breath sounds: Normal breath sounds. No wheezing or rales.  Musculoskeletal:     Right lower leg: No edema.     Left lower leg: No edema.  Skin:    General: Skin is warm and dry.     Findings: No rash.  Neurological:     Mental Status: He is alert. Mental status is at baseline.   Psychiatric:        Mood and Affect: Mood normal.        Lab Results  Component Value Date   WBC 6.8 09/28/2022   HGB 10.2 (L) 09/28/2022   HCT 31.9 (L) 09/28/2022   PLT 161.0 09/28/2022   GLUCOSE 157 (H) 10/12/2022   CHOL 165 09/28/2022   TRIG 156.0 (H) 09/28/2022   HDL 64.80 09/28/2022   LDLCALC 69 09/28/2022   ALT 12 09/28/2022   AST 15 09/28/2022   NA 137 10/12/2022   K 5.8 No hemolysis seen (H) 10/12/2022   CL 104 10/12/2022   CREATININE 2.44 (H) 10/12/2022   BUN 42 (H) 10/12/2022   CO2 25 10/12/2022   TSH 1.24 09/28/2022   PSA 1.42 07/16/2007   INR 1.2 01/21/2019   HGBA1C 6.8 (H) 09/28/2022   MICROALBUR 9.45 03/19/2018     Assessment & Plan:    See Problem List for Assessment and Plan of chronic medical problems.

## 2022-11-06 ENCOUNTER — Telehealth: Payer: Self-pay

## 2022-11-06 ENCOUNTER — Ambulatory Visit (INDEPENDENT_AMBULATORY_CARE_PROVIDER_SITE_OTHER): Payer: Medicare Other | Admitting: Internal Medicine

## 2022-11-06 ENCOUNTER — Telehealth: Payer: Self-pay | Admitting: Internal Medicine

## 2022-11-06 ENCOUNTER — Encounter: Payer: Self-pay | Admitting: Internal Medicine

## 2022-11-06 VITALS — BP 124/62 | HR 81 | Temp 98.2°F | Ht 71.0 in | Wt 162.8 lb

## 2022-11-06 DIAGNOSIS — I5032 Chronic diastolic (congestive) heart failure: Secondary | ICD-10-CM | POA: Diagnosis not present

## 2022-11-06 DIAGNOSIS — N184 Chronic kidney disease, stage 4 (severe): Secondary | ICD-10-CM | POA: Diagnosis not present

## 2022-11-06 DIAGNOSIS — E875 Hyperkalemia: Secondary | ICD-10-CM

## 2022-11-06 DIAGNOSIS — N1832 Chronic kidney disease, stage 3b: Secondary | ICD-10-CM

## 2022-11-06 DIAGNOSIS — I10 Essential (primary) hypertension: Secondary | ICD-10-CM

## 2022-11-06 LAB — BASIC METABOLIC PANEL
BUN: 58 mg/dL — ABNORMAL HIGH (ref 6–23)
CO2: 30 meq/L (ref 19–32)
Calcium: 9.4 mg/dL (ref 8.4–10.5)
Chloride: 101 meq/L (ref 96–112)
Creatinine, Ser: 2.85 mg/dL — ABNORMAL HIGH (ref 0.40–1.50)
GFR: 19.09 mL/min — ABNORMAL LOW (ref >60.00)
Glucose, Bld: 128 mg/dL — ABNORMAL HIGH (ref 70–99)
Potassium: 6.4 meq/L (ref 3.5–5.1)
Sodium: 135 meq/L (ref 135–145)

## 2022-11-06 MED ORDER — SODIUM POLYSTYRENE SULFONATE PO POWD: Freq: Once | ORAL | 0 refills | Status: AC

## 2022-11-06 NOTE — Telephone Encounter (Signed)
Patient and daughter aware - advised of changes in medication

## 2022-11-06 NOTE — Assessment & Plan Note (Signed)
Chronic Appears euvolemic Following with cardiology Taking 40 mg of furosemide alternating with 20 mg daily BMP

## 2022-11-06 NOTE — Assessment & Plan Note (Signed)
Chronic GFR worse recently potassium was elevated Enalapril decreased from twice daily to once daily BMP today-shows that potassium is still elevated GFR slightly worse Referral ordered to nephrology Stop enalapril Kayexalate 15 g x 1 Recheck BMP May need to adjust Lasix dose

## 2022-11-06 NOTE — Telephone Encounter (Signed)
Advised over my-chart

## 2022-11-06 NOTE — Assessment & Plan Note (Signed)
Acute All enalapril changed from twice a day to once a day Kayexalate 15 g x 1 Recheck BMP

## 2022-11-06 NOTE — Telephone Encounter (Signed)
CRITICAL VALUE STICKER  CRITICAL VALUE: potassium 6.4  RECEIVER (on-site recipient of call): Dahlia Client   DATE & TIME NOTIFIED: 11/06/22 at 12:55 pm  MESSENGER (representative from lab): [email or web address]   MD NOTIFIED: Dr. Lawerance Bach

## 2022-11-06 NOTE — Telephone Encounter (Signed)
Pt daughter called wanting to speak with Albin Felling about some powder and what kind to get. Please advise.

## 2022-11-06 NOTE — Assessment & Plan Note (Signed)
Chronic Blood pressure well-controlled Discontinuing enalapril secondary to hyperkalemia Monitor BP off medication

## 2022-11-07 ENCOUNTER — Telehealth: Payer: Self-pay | Admitting: Internal Medicine

## 2022-11-07 NOTE — Telephone Encounter (Signed)
Responded to patient via mychart

## 2022-11-07 NOTE — Telephone Encounter (Signed)
Pt daughter called wanting to speak with Albin Felling about the pt medication that Dr Lawerance Bach prescribed to him. Please advise.  Best call back number is [phone]

## 2022-11-08 ENCOUNTER — Encounter: Payer: Self-pay | Admitting: Emergency Medicine

## 2022-11-08 ENCOUNTER — Ambulatory Visit (INDEPENDENT_AMBULATORY_CARE_PROVIDER_SITE_OTHER): Payer: Medicare Other | Admitting: Emergency Medicine

## 2022-11-08 VITALS — BP 148/62 | HR 69 | Resp 16 | Ht 71.0 in | Wt 161.2 lb

## 2022-11-08 DIAGNOSIS — R918 Other nonspecific abnormal finding of lung field: Secondary | ICD-10-CM | POA: Diagnosis not present

## 2022-11-08 DIAGNOSIS — J9611 Chronic respiratory failure with hypoxia: Secondary | ICD-10-CM | POA: Diagnosis not present

## 2022-11-08 DIAGNOSIS — Z23 Encounter for immunization: Secondary | ICD-10-CM

## 2022-11-08 DIAGNOSIS — J439 Emphysema, unspecified: Secondary | ICD-10-CM

## 2022-11-08 NOTE — Assessment & Plan Note (Signed)
Overall doing well.  He is dealing with renal insufficiency, balancing his cardiac meds and his diuretics.  He is going to see nephrology.  If there is some concern that the prednisone is contributing to his renal dysfunction then we could consider adjusting.  For now I will leave it at 15 mg.  He will get the flu shot today, plans to get COVID-19 vaccine this fall.  Continue his bronchodilator regimen and his oxygen regimen.  Please continue your Anoro once daily. Keep your albuterol available to use 2 puffs when needed for shortness of breath, chest tightness, wheezing. Continue your prednisone 15 mg daily Flu shot today Recommend getting the COVID-19 vaccine this fall Continue your oxygen 4-6 L/min Get your repeat CT scan of the chest and follow-up with Dr. Roselind Messier this December as planned Follow with Dr Delton Coombes in 6 months or sooner if you have any problems

## 2022-11-08 NOTE — Assessment & Plan Note (Signed)
Good compliance with his oxygen 4-6 L/min.  Plan to continue same.

## 2022-11-08 NOTE — Progress Notes (Signed)
Subjective:    Patient ID: Robet Leu, male    DOB: 04-20-1931, 89 y.o.   MRN: [id number]  HPI  ROV 07/05/22 --follow-up visit for 89 year old gentleman with very severe COPD.  Also history of SBRT to a PET positive right upper lobe nodule that was increasing in size.  He has hypertension, CAD/CABG with associated systolic CHF and chronic pleural effusion, diabetes, GI bleeding with anemia, mixed vascular dementia.  At his last visit 1 month ago I started him on maintenance prednisone 20 mg daily.  He is on Anoro.  Uses oxygen 4-6 L/min. He has benefited significantly from the prednisone > better breathing, functional capacity. More stamina.   ROV 11/08/2022 --Mr. Bateson is a 31 with a history of very severe COPD.  He also underwent empiric SBRT to a right upper lobe pulmonary nodule that was suspicious for non-small cell lung cancer.  He has multifactorial dyspnea and hypoxemia, with contributions of his COPD, hypertension and CAD/CABG with diastolic and systolic CHF, chronic pleural effusions, diabetes, chronic renal insufficiency, prior GI bleeding started him on maintenance prednisone (currently 15 mg) and he did get some benefit - he believes that his breathing is stable, able to ambulate, able to do some cooking.  His oxygen is at 4-6 L/min. Remains on Anoro. Never needs his albuterol.  Since last time his serum creatinine and potassium were elevated and he had to come off enalapril. Last CT chest was 07/23/2022 > stable.       Objective:   Physical Exam  Vitals:   11/08/22 1356  BP: (!) 148/62  Pulse: 69  Resp: 16  SpO2: 95%  Weight: 161 lb 3.2 oz (73.1 kg)  Height: 5\' 11"  (1.803 m)   Gen: Pleasant, thin, in no distress,  normal affect  ENT: No lesions,  mouth clear,  oropharynx clear, no postnasal drip  Neck: No JVD, no stridor  Lungs: No use of accessory muscles, no wheezing on normal respiration, no wheeze on forced expiration.   Cardiovascular: RRR, heart sounds  normal, no murmur or gallops, no peripheral edema  Musculoskeletal: No deformities, no cyanosis or clubbing  Neuro: alert, awake, non focal  Skin: Warm, no lesions or rash     Assessment & Plan:  COPD (chronic obstructive pulmonary disease) (HCC) Overall doing well.  He is dealing with renal insufficiency, balancing his cardiac meds and his diuretics.  He is going to see nephrology.  If there is some concern that the prednisone is contributing to his renal dysfunction then we could consider adjusting.  For now I will leave it at 15 mg.  He will get the flu shot today, plans to get COVID-19 vaccine this fall.  Continue his bronchodilator regimen and his oxygen regimen.  Please continue your Anoro once daily. Keep your albuterol available to use 2 puffs when needed for shortness of breath, chest tightness, wheezing. Continue your prednisone 15 mg daily Flu shot today Recommend getting the COVID-19 vaccine this fall Continue your oxygen 4-6 L/min Get your repeat CT scan of the chest and follow-up with Dr. Roselind Messier this December as planned Follow with Dr Delton Coombes in 6 months or sooner if you have any problems  Chronic respiratory failure with hypoxia (HCC) Good compliance with his oxygen 4-6 L/min.  Plan to continue same.  Pulmonary nodules He underwent empiric SBRT to an enlarging right upper lobe pulmonary nodule with Dr.Kinard.  Surveillance CT chest planned with radiation oncology in December.    Levy Pupa, MD, PhD  11/08/2022, 2:33 PM [facility] [phone] or if no answer [phone] f

## 2022-11-08 NOTE — Patient Instructions (Signed)
Please continue your Anoro once daily. Keep your albuterol available to use 2 puffs when needed for shortness of breath, chest tightness, wheezing. Continue your prednisone 15 mg daily Flu shot today Recommend getting the COVID-19 vaccine this fall Continue your oxygen 4-6 L/min Get your repeat CT scan of the chest and follow-up with Dr. Roselind Messier this December as planned Follow with Dr Delton Coombes in 6 months or sooner if you have any problems

## 2022-11-08 NOTE — Assessment & Plan Note (Signed)
He underwent empiric SBRT to an enlarging right upper lobe pulmonary nodule with Dr.Kinard.  Surveillance CT chest planned with radiation oncology in December.

## 2022-11-09 ENCOUNTER — Other Ambulatory Visit: Payer: No Typology Code available for payment source

## 2022-11-13 ENCOUNTER — Other Ambulatory Visit (INDEPENDENT_AMBULATORY_CARE_PROVIDER_SITE_OTHER): Payer: Medicare Other

## 2022-11-13 DIAGNOSIS — E875 Hyperkalemia: Secondary | ICD-10-CM

## 2022-11-13 LAB — BASIC METABOLIC PANEL
BUN: 46 mg/dL — ABNORMAL HIGH (ref 6–23)
CO2: 26 mEq/L (ref 19–32)
Calcium: 9.3 mg/dL (ref 8.4–10.5)
Chloride: 100 mEq/L (ref 96–112)
Creatinine, Ser: 2.57 mg/dL — ABNORMAL HIGH (ref 0.40–1.50)
GFR: 21.61 mL/min — ABNORMAL LOW (ref >60.00)
Glucose, Bld: 206 mg/dL — ABNORMAL HIGH (ref 70–99)
Potassium: 4.9 mEq/L (ref 3.5–5.1)
Sodium: 135 mEq/L (ref 135–145)

## 2022-11-21 ENCOUNTER — Other Ambulatory Visit: Payer: Self-pay | Admitting: Emergency Medicine

## 2022-12-27 DIAGNOSIS — I5020 Unspecified systolic (congestive) heart failure: Secondary | ICD-10-CM | POA: Diagnosis not present

## 2022-12-27 DIAGNOSIS — N189 Chronic kidney disease, unspecified: Secondary | ICD-10-CM | POA: Diagnosis not present

## 2022-12-27 DIAGNOSIS — N184 Chronic kidney disease, stage 4 (severe): Secondary | ICD-10-CM | POA: Diagnosis not present

## 2022-12-27 DIAGNOSIS — D631 Anemia in chronic kidney disease: Secondary | ICD-10-CM | POA: Diagnosis not present

## 2022-12-27 DIAGNOSIS — I2510 Atherosclerotic heart disease of native coronary artery without angina pectoris: Secondary | ICD-10-CM | POA: Diagnosis not present

## 2022-12-27 DIAGNOSIS — C3490 Malignant neoplasm of unspecified part of unspecified bronchus or lung: Secondary | ICD-10-CM | POA: Diagnosis not present

## 2022-12-27 DIAGNOSIS — I129 Hypertensive chronic kidney disease with stage 1 through stage 4 chronic kidney disease, or unspecified chronic kidney disease: Secondary | ICD-10-CM | POA: Diagnosis not present

## 2022-12-27 DIAGNOSIS — E1122 Type 2 diabetes mellitus with diabetic chronic kidney disease: Secondary | ICD-10-CM | POA: Diagnosis not present

## 2022-12-27 DIAGNOSIS — J449 Chronic obstructive pulmonary disease, unspecified: Secondary | ICD-10-CM | POA: Diagnosis not present

## 2022-12-27 DIAGNOSIS — N2581 Secondary hyperparathyroidism of renal origin: Secondary | ICD-10-CM | POA: Diagnosis not present

## 2022-12-28 LAB — LAB REPORT - SCANNED
Albumin, Urine POC: 58.4
Albumin/Creatinine Ratio, Urine, POC: 111
Creatinine, POC: 52.4 mg/dL
EGFR: 23

## 2023-01-07 ENCOUNTER — Encounter: Payer: Self-pay | Admitting: Emergency Medicine

## 2023-01-08 NOTE — Progress Notes (Unsigned)
2  Office Visit    Patient Name: Brian [name] Date of Encounter: 01/09/2023  PCP:  [name], MD   [facility]  Cardiologist:  Verne Carrow, MD  Advanced Practice Provider:  No care team member to display Electrophysiologist:  None   HPI    Brian [name] is a 89 y.o. male past medical history significant for HTN, hyperlipidemia, DM, COPD and CAD status post CABG presents today for hospital follow-up.  He was admitted to [facility] January 2012 with NSTEMI.  Cardiac cath January 2012 with severe three-vessel CAD.  Underwent CABG x 3 on 03/03/2010.  EVAR December 2020 per Dr. Randie Heinz for 5.5 cm AAA.  Echo March 2017 with normal LV size and function, mild MR.  He had been diagnosed with lung cancer and has been treated with XRT.  He was last seen by Dr. Clifton James 11/25/2020.  He presented for follow-up and was doing well at that time.  He was on supplemental oxygen continuously.  No medication changes at that time.  He was in the ER 7/25 through 09/14/2021 with a chief complaint of shortness of breath.  He was found to be in decompensated heart failure.  He was having worsening dyspnea despite supplemental oxygen per [place].  On the day of his hospitalization he was found to have O2 saturation of 77%.  Blood pressure was 189/79, heart rate 66, respiratory rate 32.  Ultrasound of lower extremities negative for DVT.  Chest x-ray with hyperinflation and right pleural effusion, small to moderate.  CT of the chest showed no evidence of PE.  Right pleural effusion, diffuse emphysema.  EKG with sinus rhythm with second-degree type I no significant ST segment or T wave changes.  Patient was placed on furosemide for diuresis and experienced an improvement in his symptoms.  Echocardiogram was performed and showed preserved LV systolic function with positive septal hypertrophy.  Metoprolol discontinued due to bradycardia with second-degree AV block type I but improved  after discontinuation.  He was seen by me 09/26/2021, he presents for hospital follow-up.  EKG and rhythm strip were obtained which showed 2:1 second degree AV block, right bundle branch block, and left anterior fascicular block.  Heart rate 46 bpm.  Brian [name] is asymptomatic at this time and has not had any dizziness, lightheadedness, syncope, or presyncope.  His oxygen saturation was originally 87% but after keeping his O2 sat in place it came up to 94%.  He remains on 2 L of oxygen continuously and 4 L when he is up and moving around.  He is having some trouble with his oxygen machine today and has called a representative who will meet with the new machine later today at his home.  From a fluid standpoint he is doing extremely well.  He has very little lower extremity edema and his breathing has improved significantly.  He remains on Lasix 40 mg daily and Jardiance 10 mg daily.  His blood pressure is well-controlled today.  He was taken off his metoprolol in the hospital due to his bradycardia and heart block.  Today, he presents with a history of hypertension, heart failure, and stage 4 kidney disease, is accompanied by his daughter. The patient's blood pressure is noted to be high during the visit, but his daughter reports that it has been normal at home. The patient's Lasix dosage has been decreased due to well-controlled fluid status, as evidenced by the absence of swelling in the ankles and  hands. The patient's oxygen requirements have increased slightly, with the patient alternating between four and five liters. The patient's daughter reports that the patient experiences shortness of breath during activities. The patient's heart rate has been stable, and there have been no recent symptoms of heart block such as dizziness or lightheadedness. The patient's recent lab results show an elevated potassium level and a decrease in hemoglobin. The patient is taking iron and vitamin C daily.  Reports no  shortness of breath nor dyspnea on exertion. Reports no chest pain, pressure, or tightness. No edema, orthopnea, PND. Reports no palpitations.   Discussed the use of AI scribe software for clinical note transcription with the patient, who gave verbal consent to proceed.   Past Medical History    Past Medical History:  Diagnosis Date   AAA (abdominal aortic aneurysm) (HCC)    Arthritis    Asthma 1976   Bronchiolitis 06/2014   CAD (coronary artery disease)    NSTEMI 02/2010;  s/p CABG 1/12 (L-LAD, SOM1, S-PDA); echo in 08/2010: Mild LVH, EF 55%, grade 2 diastolic dysfunction, mild MR, mild LAE, mildly reduced RV function, mild RAE, PASP 42   Carpal tunnel syndrome of left wrist    Cecal angiodysplasia 04/04/2022   Colon polyps    COPD (chronic obstructive pulmonary disease) (HCC)    Diabetes mellitus 2000   Type II, controlls with diet   Dyspnea    ED (erectile dysfunction)    Gout    History of radiation therapy 03/09/2020-03/16/2020   SBRT right lung: Dr. Antony Blackbird   HTN (hypertension)    Hyperlipidemia    Inguinal hernia    Right   Macular degeneration    Myocardial infarction [facility]) 1 -13-2012   Neuropathy    Past Surgical History:  Procedure Laterality Date   3 vessel CABG  03/03/2010   ABDOMINAL AORTIC ENDOVASCULAR STENT GRAFT N/A 01/21/2019   Procedure: ABDOMINAL AORTIC ENDOVASCULAR STENT GRAFT;  Surgeon: [name], MD;  Location: [facility] OR;  Service: Vascular;  Laterality: N/A;   cataract left eye     COLONOSCOPY     neg 2009   COLONOSCOPY  2014   5 mm sessile polyp; AVM   COLONOSCOPY WITH PROPOFOL N/A 04/04/2022   Procedure: COLONOSCOPY WITH PROPOFOL;  Surgeon: [name], MD;  Location: [facility];  Service: Gastroenterology;  Laterality: N/A;   CORONARY ARTERY BYPASS GRAFT  2012   X3   ENDOVASCULAR REPAIR/STENT GRAFT  01/21/2019   ABDOMINAL AORTIC ENDOVASCULAR STENT GRAFT (N/A )   ESOPHAGOGASTRODUODENOSCOPY (EGD) WITH PROPOFOL N/A 04/04/2022    Procedure: ESOPHAGOGASTRODUODENOSCOPY (EGD) WITH PROPOFOL;  Surgeon: [name], MD;  Location: [facility]COPY;  Service: Gastroenterology;  Laterality: N/A;   EYE SURGERY     Left cataract   HERNIA REPAIR     HOT HEMOSTASIS N/A 04/04/2022   Procedure: HOT HEMOSTASIS (ARGON PLASMA COAGULATION/BICAP);  Surgeon: [name], MD;  Location: [facility];  Service: Gastroenterology;  Laterality: N/A;   INCISION AND DRAINAGE PERIRECTAL ABSCESS     INGUINAL HERNIA REPAIR      Allergies  Allergies  Allergen Reactions   Meperidine Hcl Swelling and Other (See Comments)    Tongue swelling Because of a history of documented adverse serious drug reaction, Medi Alert bracelet  is recommended.   Sertraline Shortness Of Breath and Other (See Comments)    Burning sensation from feet to head, diff breathing   Gabapentin Other (See Comments)    Confusion  Memantine Other (See Comments)    Can take only up to 5 mg a day- causes lethargy past that amount   Latex Rash and Other (See Comments)    Minor rash   Zolpidem Tartrate Other (See Comments)    Nightmares     EKGs/Labs/Other Studies Reviewed:   The following studies were reviewed today:  Echocardiogram 09/13/2021  IMPRESSIONS     1. Left ventricular ejection fraction, by estimation, is 60 to 65%. The  left ventricle has normal function. The left ventricle has no regional  wall motion abnormalities. There is severe asymmetric left ventricular  hypertrophy of the septal segment. Left   ventricular diastolic parameters are consistent with Grade I diastolic  dysfunction (impaired relaxation).   2. Right ventricular systolic function is normal. The right ventricular  size is mildly enlarged. There is mildly elevated pulmonary artery  systolic pressure. The estimated right ventricular systolic pressure is  37.9 mmHg.   3. Right atrial size was mild to moderately dilated.   4. Mild bileaflet mitral valve prolapse, posterior >  anterior. . The  mitral valve is myxomatous. Trivial mitral valve regurgitation. No  evidence of mitral stenosis.   5. The aortic valve is grossly normal. There is mild calcification of the  aortic valve. Aortic valve regurgitation is not visualized. No aortic  stenosis is present.   6. The inferior vena cava is normal in size with greater than 50%  respiratory variability, suggesting right atrial pressure of 3 mmHg.   EKG:  EKG is  ordered today.  The ekg ordered today demonstrates 2:1 second-degree AV block, rate 46 bpm  Recent Labs: 05/08/2022: B Natriuretic Peptide 411.7 09/28/2022: ALT 12; Hemoglobin 10.2; Platelets 161.0; TSH 1.24 11/13/2022: BUN 46; Creatinine, Ser 2.57; Potassium 4.9; Sodium 135  Recent Lipid Panel    Component Value Date/Time   CHOL 165 09/28/2022 1122   TRIG 156.0 (H) 09/28/2022 1122   HDL 64.80 09/28/2022 1122   CHOLHDL 3 09/28/2022 1122   VLDL 31.2 09/28/2022 1122   LDLCALC 69 09/28/2022 1122    Home Medications   Current Meds  Medication Sig   ACCU-CHEK COMPACT STRIPS test strip TEST BLOOD SUGAR ONCE DAILY   albuterol (VENTOLIN HFA) 108 (90 Base) MCG/ACT inhaler Inhale 2 puffs into the lungs every 6 (six) hours as needed for wheezing or shortness of breath.   ANORO ELLIPTA 62.5-25 MCG/ACT AEPB USE 1 INHALATION DAILY   Aspirin 81 MG EC tablet Take 81 mg by mouth daily.   Cholecalciferol (VITAMIN D3) 50 MCG (2000 UT) TABS Take 2,000 Units by mouth daily.   Cyanocobalamin (VITAMIN B12 PO) Take 1 tablet by mouth daily.   escitalopram (LEXAPRO) 10 MG tablet Take by mouth.   ferrous sulfate 325 (65 FE) MG tablet Take 1 tablet (325 mg total) by mouth daily with breakfast.   furosemide (LASIX) 40 MG tablet Take 1 tablet (40 mg total) by mouth daily. (Patient taking differently: Take 40 mg by mouth in the morning.)   Magnesium Oxide 420 MG TABS Take 420 mg by mouth daily.    memantine (NAMENDA) 10 MG tablet Take 1 tablet (10 mg) twice a day (Patient taking  differently: Take 10 mg by mouth daily.)   mirtazapine (REMERON) 15 MG tablet Take 1 tablet (15 mg total) by mouth at bedtime.   OXYGEN Inhale 5 L/min into the lungs continuous.   predniSONE (DELTASONE) 10 MG tablet TAKE 2 TABLETS(20 MG) BY MOUTH DAILY (Patient taking differently: Take 10 mg by  mouth 2 (two) times daily with a meal.)   simvastatin (ZOCOR) 40 MG tablet TAKE 1 TABLET DAILY (Patient taking differently: Take 40 mg by mouth daily.)   sulfamethoxazole-trimethoprim (BACTRIM DS) 800-160 MG tablet TAKE 1 TABLET BY MOUTH AS A SINGLE DOSE. TAKE 1 TABLET ON MONDAY, WEDNESDAY, AND FRIDAY   SYSTANE COMPLETE PF 0.6 % SOLN Place 1 drop into both eyes 4 (four) times daily.   triamcinolone cream (KENALOG) 0.1 % Apply 1 application  topically daily as needed (for dryness/itching (affected areas)).     Review of Systems      All other systems reviewed and are otherwise negative except as noted above.  Physical Exam    VS:  BP (!) 180/78   Pulse 76   Ht 5\' 11"  (1.803 m)   Wt 168 lb (76.2 kg)   SpO2 98%   BMI 23.43 kg/m  , BMI Body mass index is 23.43 kg/m.  Wt Readings from Last 3 Encounters:  01/09/23 168 lb (76.2 kg)  11/08/22 161 lb 3.2 oz (73.1 kg)  11/06/22 162 lb 12.8 oz (73.8 kg)     GEN: Well nourished, well developed, in no acute distress. HEENT: normal. Neck: Supple, no JVD, carotid bruits, or masses. Cardiac: irregular, bradycardia, no murmurs, rubs, or gallops. No clubbing, cyanosis, edema.  Radials/PT 2+ and equal bilaterally.  Respiratory:  Respirations regular and unlabored, clear to auscultation bilaterally. GI: Soft, nontender, nondistended. MS: No deformity or atrophy. Skin: Warm and dry, no rash. Neuro:  Strength and sensation are intact. Psych: Normal affect.  Assessment & Plan   Chronic Kidney Disease (Stage 4) Progressed from stage 3 to stage 4 over the past year. Enalapril was discontinued due to elevated potassium. Patient is under the care of a  nephrologist. -Continue monitoring kidney function and potassium levels. -Consider re-initiation of Jardiance if nephrologist agrees.  Hypertension Elevated blood pressure in clinic, but reported normal readings at home. -Continue monitoring blood pressure at home. -Recheck blood pressure after the visit.  Acute on chronic CHF Patient is on alternating doses of Lasix 40mg  and 20mg . Fluid status appears well controlled. -Continue current Lasix regimen. -recent echo reviewed  -Monitor for symptoms of fluid overload.  2: 1 second-degree AV block Previous EKG showed 2:1 heart block. No current symptoms of dizziness, lightheadedness, or syncope. -Continue monitoring for symptoms. -Re-evaluate with electrophysiologist if symptoms develop.  Chronic Obstructive Pulmonary Disease Increased oxygen requirements and shortness of breath with activity. Prednisone increased to 20mg  daily for better control. -Continue Prednisone 20mg  daily. -Monitor for changes in respiratory status.  Hyperkalemia Previous episode of elevated potassium. Current level is 5.8. -Continue monitoring potassium levels. -Consider adjusting Lasix dose if levels remain elevated.  Anemia Hemoglobin levels are decreasing despite daily iron and vitamin C supplementation. -Notify primary care provider (Dr. Lawerance Bach) for further evaluation and management.  Vitamin D Deficiency Despite daily supplementation, Vitamin D levels appear low. -Notify primary care provider (Dr. Lawerance Bach) for further evaluation and management.  General Health Maintenance / Followup Plans -Continue monitoring blood pressure, kidney function, potassium levels, respiratory status, and heart rhythm at home. -Follow up with nephrologist in 4 months. -Annual follow-up in clinic, or sooner if any changes or concerns arise.  Disposition: Follow up 3 months with Verne Carrow, MD or APP.  Signed, Sharlene Dory, PA-C 01/09/2023, 11:54 AM [facility]

## 2023-01-09 ENCOUNTER — Ambulatory Visit: Payer: Medicare Other | Attending: Physician Assistant | Admitting: Physician Assistant

## 2023-01-09 ENCOUNTER — Encounter: Payer: Self-pay | Admitting: Physician Assistant

## 2023-01-09 VITALS — BP 180/78 | HR 76 | Ht 71.0 in | Wt 168.0 lb

## 2023-01-09 DIAGNOSIS — I441 Atrioventricular block, second degree: Secondary | ICD-10-CM

## 2023-01-09 DIAGNOSIS — J449 Chronic obstructive pulmonary disease, unspecified: Secondary | ICD-10-CM

## 2023-01-09 DIAGNOSIS — I5043 Acute on chronic combined systolic (congestive) and diastolic (congestive) heart failure: Secondary | ICD-10-CM | POA: Diagnosis not present

## 2023-01-09 DIAGNOSIS — N1831 Chronic kidney disease, stage 3a: Secondary | ICD-10-CM

## 2023-01-09 DIAGNOSIS — J9621 Acute and chronic respiratory failure with hypoxia: Secondary | ICD-10-CM | POA: Diagnosis not present

## 2023-01-09 DIAGNOSIS — I10 Essential (primary) hypertension: Secondary | ICD-10-CM | POA: Diagnosis not present

## 2023-01-09 NOTE — Patient Instructions (Addendum)
Medication Instructions:  No changes *If you need a refill on your cardiac medications before your next appointment, please call your pharmacy*   Lab Work: No labs   Testing/Procedures: No testing   Follow-Up: At [facility], you and your health needs are our priority.  As part of our continuing mission to provide you with exceptional heart care, we have created designated Provider Care Teams.  These Care Teams include your primary Cardiologist (physician) and Advanced Practice Providers (APPs -  Physician Assistants and Nurse Practitioners) who all work together to provide you with the care you need, when you need it.  We recommend signing up for the patient portal called "MyChart".  Sign up information is provided on this After Visit Summary.  MyChart is used to connect with patients for Virtual Visits (Telemedicine).  Patients are able to view lab/test results, encounter notes, upcoming appointments, etc.  Non-urgent messages can be sent to your provider as well.   To learn more about what you can do with MyChart, go to [email or web address].    Your next appointment:   1 year(s)  Provider:   Verne Carrow, MD  or Jari Favre, PA-C  Other Instructions: Please keep track of blood pressure for the next 2 weeks.

## 2023-01-10 ENCOUNTER — Encounter: Payer: Self-pay | Admitting: Internal Medicine

## 2023-01-14 ENCOUNTER — Telehealth: Payer: Self-pay | Admitting: *Deleted

## 2023-01-14 NOTE — Telephone Encounter (Signed)
Called patient to inform of CT for 01-25-23- arrival time- 12:45 pm @ [facility], no restrictions to the scan, patient to receive results from Dr. Roselind Messier on 01-28-23 @ 11:45 am, spoke with patient's daugherValetta Mole and she is aware of these appts. and the instructions

## 2023-01-17 ENCOUNTER — Other Ambulatory Visit: Payer: Self-pay | Admitting: Internal Medicine

## 2023-01-25 ENCOUNTER — Ambulatory Visit (HOSPITAL_COMMUNITY)
Admission: RE | Admit: 2023-01-25 | Discharge: 2023-01-25 | Disposition: A | Discharge status: Home / Self Care | Payer: Medicare Other | Source: Ambulatory Visit | Attending: Radiology | Admitting: Radiology

## 2023-01-25 DIAGNOSIS — Z85118 Personal history of other malignant neoplasm of bronchus and lung: Secondary | ICD-10-CM | POA: Diagnosis not present

## 2023-01-25 DIAGNOSIS — R918 Other nonspecific abnormal finding of lung field: Secondary | ICD-10-CM | POA: Insufficient documentation

## 2023-01-25 DIAGNOSIS — J432 Centrilobular emphysema: Secondary | ICD-10-CM | POA: Diagnosis not present

## 2023-01-27 NOTE — Progress Notes (Signed)
Radiation Oncology         [phone] ________________________________  Name: Brian [name] MRN: [id number]  Date: 01/28/2023  DOB: [birth year]  Follow-Up Visit Note  CC: [name], MD  Leslye Peer, MD  No diagnosis found.  Diagnosis: Pet positive Right upper lobe lung nodule, suspicious for primary bronchogenic carcinoma   Interval Since Last Radiation: 2 years, 10 months, and 13 days  Radiation Treatment Dates: 03/09/2020 through 03/16/2020   Site: Right lung Technique: SBRT Total Dose (Gy): 54/54 Dose per Fx (Gy): 18 Completed Fx: 3/3 Beam Energies: 6XFFF  Narrative:  The patient returns today for routine follow-up and to review recent imaging. He was last seen here for follow-up on 07/26/22.    He continues to follow with Dr. Delton Coombes and is doing relatively well overall from a respiratory standpoint. He has been dealing with renal insufficiency and balancing his cardiac medications with his diuretics. He also continues to take prednisone and will be meeting with nephrology to determine if it is contributing to his renal dysfunction.   His most recent chest CT on 01/25/23 demonstrates: ***                             Allergies:  is allergic to meperidine hcl, sertraline, gabapentin, memantine, latex, and zolpidem tartrate.  Meds: Current Outpatient Medications  Medication Sig Dispense Refill   ACCU-CHEK COMPACT STRIPS test strip TEST BLOOD SUGAR ONCE DAILY 100 each 1   albuterol (VENTOLIN HFA) 108 (90 Base) MCG/ACT inhaler Inhale 2 puffs into the lungs every 6 (six) hours as needed for wheezing or shortness of breath. 18 g 0   ANORO ELLIPTA 62.5-25 MCG/ACT AEPB USE 1 INHALATION DAILY 240 each 3   Aspirin 81 MG EC tablet Take 81 mg by mouth daily.     Cholecalciferol (VITAMIN D3) 50 MCG (2000 UT) TABS Take 2,000 Units by mouth daily.     Cyanocobalamin (VITAMIN B12 PO) Take 1 tablet by mouth daily.     escitalopram (LEXAPRO) 10 MG tablet Take by mouth.      ferrous sulfate 325 (65 FE) MG tablet Take 1 tablet (325 mg total) by mouth daily with breakfast. 90 tablet 1   furosemide (LASIX) 40 MG tablet Take 1 tablet (40 mg total) by mouth daily. (Patient taking differently: Take 40 mg by mouth in the morning.) 90 tablet 3   Magnesium Oxide 420 MG TABS Take 420 mg by mouth daily.      memantine (NAMENDA) 10 MG tablet Take 1 tablet (10 mg) twice a day (Patient taking differently: Take 10 mg by mouth daily.) 60 tablet 11   mirtazapine (REMERON) 15 MG tablet TAKE 1 TABLET(15 MG) BY MOUTH AT BEDTIME 90 tablet 1   OXYGEN Inhale 5 L/min into the lungs continuous.     predniSONE (DELTASONE) 10 MG tablet TAKE 2 TABLETS(20 MG) BY MOUTH DAILY (Patient taking differently: Take 10 mg by mouth 2 (two) times daily with a meal.) 60 tablet 11   simvastatin (ZOCOR) 40 MG tablet TAKE 1 TABLET DAILY (Patient taking differently: Take 40 mg by mouth daily.) 90 tablet 3   sulfamethoxazole-trimethoprim (BACTRIM DS) 800-160 MG tablet TAKE 1 TABLET BY MOUTH AS A SINGLE DOSE. TAKE 1 TABLET ON MONDAY, WEDNESDAY, AND FRIDAY 12 tablet 5   SYSTANE COMPLETE PF 0.6 % SOLN Place 1 drop into both eyes 4 (four) times daily.     triamcinolone cream (KENALOG) 0.1 %  Apply 1 application  topically daily as needed (for dryness/itching (affected areas)).     No current facility-administered medications for this encounter.    Physical Findings: The patient is in no acute distress. Patient is alert and oriented.  vitals were not taken for this visit. .  No significant changes. Lungs are clear to auscultation bilaterally. Heart has regular rate and rhythm. No palpable cervical, supraclavicular, or axillary adenopathy. Abdomen soft, non-tender, normal bowel sounds.   Lab Findings: Lab Results  Component Value Date   WBC 6.8 09/28/2022   HGB 10.2 (L) 09/28/2022   HCT 31.9 (L) 09/28/2022   MCV 97.6 09/28/2022   PLT 161.0 09/28/2022    Radiographic Findings: No results found.  Impression:  Pet positive Right upper lobe lung nodule, suspicious for primary bronchogenic carcinoma   The patient is recovering from the effects of radiation.  ***  Plan:  ***   *** minutes of total time was spent for this patient encounter, including preparation, face-to-face counseling with the patient and coordination of care, physical exam, and documentation of the encounter. ____________________________________  Billie Lade, PhD, MD  This document serves as a record of services personally performed by Antony Blackbird, MD. It was created on his behalf by Neena Rhymes, a trained medical scribe. The creation of this record is based on the scribe's personal observations and the provider's statements to them. This document has been checked and approved by the attending provider.

## 2023-01-28 ENCOUNTER — Ambulatory Visit
Admission: RE | Admit: 2023-01-28 | Discharge: 2023-01-28 | Disposition: A | Discharge status: Home / Self Care | Payer: Medicare Other | Source: Ambulatory Visit | Attending: Radiation Oncology | Admitting: Radiation Oncology

## 2023-01-28 ENCOUNTER — Encounter: Payer: Self-pay | Admitting: Radiation Oncology

## 2023-01-28 VITALS — BP 168/73 | HR 77 | Temp 97.6°F | Resp 20 | Ht 71.0 in | Wt 169.4 lb

## 2023-01-28 DIAGNOSIS — Z79899 Other long term (current) drug therapy: Secondary | ICD-10-CM | POA: Diagnosis not present

## 2023-01-28 DIAGNOSIS — Z7952 Long term (current) use of systemic steroids: Secondary | ICD-10-CM | POA: Diagnosis not present

## 2023-01-28 DIAGNOSIS — I700 Atherosclerosis of aorta: Secondary | ICD-10-CM | POA: Insufficient documentation

## 2023-01-28 DIAGNOSIS — Z951 Presence of aortocoronary bypass graft: Secondary | ICD-10-CM | POA: Diagnosis not present

## 2023-01-28 DIAGNOSIS — M47814 Spondylosis without myelopathy or radiculopathy, thoracic region: Secondary | ICD-10-CM | POA: Diagnosis not present

## 2023-01-28 DIAGNOSIS — Z7982 Long term (current) use of aspirin: Secondary | ICD-10-CM | POA: Diagnosis not present

## 2023-01-28 DIAGNOSIS — Z9889 Other specified postprocedural states: Secondary | ICD-10-CM | POA: Insufficient documentation

## 2023-01-28 DIAGNOSIS — Z923 Personal history of irradiation: Secondary | ICD-10-CM | POA: Diagnosis not present

## 2023-01-28 DIAGNOSIS — J432 Centrilobular emphysema: Secondary | ICD-10-CM | POA: Diagnosis not present

## 2023-01-28 DIAGNOSIS — R911 Solitary pulmonary nodule: Secondary | ICD-10-CM

## 2023-01-28 DIAGNOSIS — F419 Anxiety disorder, unspecified: Secondary | ICD-10-CM | POA: Insufficient documentation

## 2023-01-28 DIAGNOSIS — R918 Other nonspecific abnormal finding of lung field: Secondary | ICD-10-CM | POA: Insufficient documentation

## 2023-01-28 MED ORDER — LORAZEPAM 0.5 MG PO TABS: 0.5000 mg | ORAL_TABLET | Freq: Once | ORAL | 0 refills | Status: DC | PRN

## 2023-01-28 NOTE — Progress Notes (Signed)
Robet Leu is here today for follow up post radiation to the lung and to receive results from CT scan.  Lung Side: Right  Does the patient complain of any of the following: Pain:No Shortness of breath w/wo exertion: No Cough: No Hemoptysis: No Pain with swallowing: No Swallowing/choking concerns: No Appetite: Good Energy Level: Good Post radiation skin Changes: No  BP (!) 168/73 (BP Location: Left Arm, Patient Position: Sitting, Cuff Size: Normal)   Pulse 77   Temp 97.6 F (36.4 C)   Resp 20   Ht 5\' 11"  (1.803 m)   Wt 169 lb 6.4 oz (76.8 kg)   SpO2 95%   PF (!) 4 L/min   BMI 23.63 kg/m

## 2023-02-15 ENCOUNTER — Other Ambulatory Visit: Payer: Self-pay | Admitting: Cardiovascular Disease

## 2023-02-27 ENCOUNTER — Other Ambulatory Visit: Payer: Self-pay | Admitting: Internal Medicine

## 2023-02-27 MED ORDER — SIMVASTATIN 40 MG PO TABS: 40.0000 mg | ORAL_TABLET | Freq: Every day | ORAL | 3 refills | Status: DC

## 2023-02-27 NOTE — Telephone Encounter (Signed)
 Copied from CRM [id number]. Topic: Clinical - Medication Refill >> Feb 27, 2023 11:59 AM Evie NOVAK wrote: Most Recent Primary Care Visit:  Provider: [facility]  Department: [facility]  Visit Type: LAB  Date: 11/13/2022  Medication: simvastatin  (ZOCOR ) 40 MG tablet   Has the patient contacted their pharmacy? Yes (Agent: If no, request that the patient contact the pharmacy for the refill. If patient does not wish to contact the pharmacy document the reason why and proceed with request.) (Agent: If yes, when and what did the pharmacy advise?)  Is this the correct pharmacy for this prescription? Yes If no, delete pharmacy and type the correct one.  This is the patient's preferred pharmacy:  [facility] [address], [place] - [address] AT [facility] [address] [place] [address] Phone: [phone] Fax: [phone]    Has the prescription been filled recently? No  Is the patient out of the medication? Yes  Has the patient been seen for an appointment in the last year OR does the patient have an upcoming appointment? Yes  Can we respond through MyChart? No  Agent: Please be advised that Rx refills may take up to 3 business days. We ask that you follow-up with your pharmacy.

## 2023-03-15 ENCOUNTER — Encounter: Payer: Self-pay | Admitting: Emergency Medicine

## 2023-03-15 ENCOUNTER — Encounter: Payer: Self-pay | Admitting: Internal Medicine

## 2023-03-21 ENCOUNTER — Ambulatory Visit: Payer: Medicare Other | Admitting: Emergency Medicine

## 2023-03-21 ENCOUNTER — Encounter: Payer: Self-pay | Admitting: Emergency Medicine

## 2023-03-21 VITALS — BP 164/69 | HR 67 | Temp 97.5°F | Ht 71.0 in | Wt 171.6 lb

## 2023-03-21 DIAGNOSIS — J449 Chronic obstructive pulmonary disease, unspecified: Secondary | ICD-10-CM

## 2023-03-21 DIAGNOSIS — J9611 Chronic respiratory failure with hypoxia: Secondary | ICD-10-CM | POA: Diagnosis not present

## 2023-03-21 DIAGNOSIS — R918 Other nonspecific abnormal finding of lung field: Secondary | ICD-10-CM

## 2023-03-21 DIAGNOSIS — J439 Emphysema, unspecified: Secondary | ICD-10-CM

## 2023-03-21 NOTE — Progress Notes (Signed)
Subjective:    Patient ID: Brian [name], male    DOB: 01-17-1932, 89 y.o.   MRN: [id number]  HPI  ROV 11/08/2022 --Brian [name] is a 89 with a history of Brian severe COPD.  Brian [name] also underwent empiric SBRT to a right upper lobe pulmonary nodule that was suspicious for non-small cell lung cancer.  Brian [name] has multifactorial dyspnea and hypoxemia, with contributions of his COPD, hypertension and CAD/CABG with diastolic and systolic CHF, chronic pleural effusions, diabetes, chronic renal insufficiency, prior GI bleeding started him on maintenance prednisone (currently 15 mg) and Brian [name] did get some benefit - Brian [name] believes that his breathing is stable, able to ambulate, able to do some cooking.  His oxygen is at 4-6 L/min. Remains on Anoro. Never needs his albuterol.  Since last time his serum creatinine and potassium were elevated and Brian [name] had to come off enalapril. Last CT chest was 07/23/2022 > stable.   ROV 03/21/2023 --follow-up visit for Brian pleasant 89 year old [name] with Brian severe COPD, empiric SBRT to a right upper lobe pulmonary nodule that was presumed to be non-small cell lung cancer.  Brian [name] has hypoxemic respiratory failure due to this as well as history of hypertension, CAD/CABG with diastolic and systolic CHF, chronic effusions, chronic renal insufficiency, history of GI bleeding. Brian [name] is currently managed on oxygen 4-6 L/min, Anoro, maintenance prednisone 20 mg daily.  Continues to follow closely with Dr. Roselind Messier at radiation oncology. Brian [name] continues to get around ok. Brian [name] will get dyspnea w showering. Has to stop to rest w walking through the house. Brian [name] is using a cane or a walker. The change to pred 20mg  has been beneficial - better breathing and appetite. No cough or wheeze.  CT planned for March  CT scan of the chest 01/25/2023 reviewed by me shows trace right effusion, centrilobular and paraseptal emphysema with right upper lobe radiation changes.  This region appears stable.  Stable 5 mm left upper lobe  nodule.  Slight enlargement of a lingular nodule from 4 mm to 7 mm      Objective:   Physical Exam  Vitals:   03/21/23 1056  BP: (!) 164/69  Pulse: 67  Temp: (!) 97.5 F (36.4 C)  TempSrc: Oral  SpO2: 97%  Weight: 171 lb 9.6 oz (77.8 kg)  Height: 5\' 11"  (1.803 m)    Gen: Pleasant, thin, in no distress,  normal affect  ENT: No lesions,  mouth clear,  oropharynx clear, no postnasal drip  Neck: No JVD, no stridor  Lungs: No use of accessory muscles, no wheezing on normal respiration, no wheeze on forced expiration.   Cardiovascular: RRR, heart sounds normal, no murmur or gallops, no peripheral edema  Musculoskeletal: No deformities, no cyanosis or clubbing  Neuro: alert, awake, non focal  Skin: Warm, no lesions or rash     Assessment & Plan:  Pulmonary nodules Brian [name] has been treated empirically on the right for a presumed non-small cell lung cancer.  Brian [name] now has a slowly enlarging left-sided nodule, now 7 mm.  The next CT chest will be in March with Dr. Roselind Messier.  Brian [name] may be a candidate for another round of SBRT depending on interval change.  COPD (chronic obstructive pulmonary disease) (HCC) Severe COPD.  No exacerbations.  Fairly good functional capacity despite the severity of his disease.  Continue Anoro once daily. Keep your albuterol available to use 2 puffs when needed for shortness of breath, chest tightness, wheezing. Continue prednisone 20 mg once daily Follow with  Dr Delton Coombes in 6 months or sooner if you have any problems  Chronic respiratory failure with hypoxia (HCC) Continue your oxygen at all times and titrate between 4-6 L/min depending on your level of exertion.     Levy Pupa, MD, PhD 03/21/2023, 11:27 AM  Pulmonary and Critical Care [phone] or if no answer [phone] f

## 2023-03-21 NOTE — Assessment & Plan Note (Signed)
Severe COPD.  No exacerbations.  Fairly good functional capacity despite the severity of his disease.  Continue Anoro once daily. Keep your albuterol available to use 2 puffs when needed for shortness of breath, chest tightness, wheezing. Continue prednisone 20 mg once daily Follow with Dr Delton Coombes in 6 months or sooner if you have any problems

## 2023-03-21 NOTE — Patient Instructions (Addendum)
We reviewed your CT scan of the chest today.  Please continue to follow-up with Dr. Roselind Messier and get your repeat CT chest in March as per his plans. Continue Anoro once daily. Keep your albuterol available to use 2 puffs when needed for shortness of breath, chest tightness, wheezing. Continue prednisone 20 mg once daily Continue your oxygen at all times and titrate between 4-6 L/min depending on your level of exertion. Follow with Dr Delton Coombes in 6 months or sooner if you have any problems

## 2023-03-21 NOTE — Assessment & Plan Note (Signed)
He has been treated empirically on the right for a presumed non-small cell lung cancer.  He now has a slowly enlarging left-sided nodule, now 7 mm.  The next CT chest will be in March with Dr. Roselind Messier.  He may be a candidate for another round of SBRT depending on interval change.

## 2023-03-21 NOTE — Assessment & Plan Note (Signed)
Continue your oxygen at all times and titrate between 4-6 L/min depending on your level of exertion.

## 2023-03-31 ENCOUNTER — Encounter: Payer: Self-pay | Admitting: Internal Medicine

## 2023-03-31 NOTE — Progress Notes (Signed)
 Subjective:    Patient ID: Brian [name], male    DOB: January 16, 1932, 89 y.o.   MRN: [id number]     HPI Brian [name] is here for follow up of his chronic medical problems.  Discuss need for family to move it  Seeing nephrology   Wearing brace left brace to help with cramping and pain.   Knots on right forehead - no injuries - no pain.  New and came up quickly.    Lung nodule - dr Brian [name] - monitoring  Pain in arms, legs, ankles - intermittent. Arms feels like nerve pain - hot water helps.  Leg pain - no muscle spasms.  Drinks 3 bottles of water. Takes mag.  Mustard helps.  Leg pain comes at night.     Medications and allergies reviewed with patient and updated if appropriate.  Current Outpatient Medications on File Prior to Visit  Medication Sig Dispense Refill   ACCU-CHEK COMPACT STRIPS test strip TEST BLOOD SUGAR ONCE DAILY 100 each 1   albuterol  (VENTOLIN  HFA) 108 (90 Base) MCG/ACT inhaler Inhale 2 puffs into the lungs every 6 (six) hours as needed for wheezing or shortness of breath. 18 g 0   ANORO ELLIPTA  62.5-25 MCG/ACT AEPB USE 1 INHALATION DAILY 240 each 3   Aspirin  81 MG EC tablet Take 81 mg by mouth daily.     Cholecalciferol  (VITAMIN D3) 50 MCG (2000 UT) TABS Take 2,000 Units by mouth daily.     Cyanocobalamin  (VITAMIN B12 PO) Take 1 tablet by mouth daily.     escitalopram  (LEXAPRO ) 10 MG tablet Take 10 mg by mouth. Pt take 15mg      ferrous sulfate  325 (65 FE) MG tablet Take 1 tablet (325 mg total) by mouth daily with breakfast. 90 tablet 1   furosemide  (LASIX ) 40 MG tablet TAKE 1 TABLET(40 MG) BY MOUTH DAILY 90 tablet 3   Magnesium  Oxide 420 MG TABS Take 420 mg by mouth daily.      memantine  (NAMENDA ) 10 MG tablet Take 1 tablet (10 mg) twice a day (Patient taking differently: Take 10 mg by mouth daily.) 60 tablet 11   mirtazapine  (REMERON ) 15 MG tablet TAKE 1 TABLET(15 MG) BY MOUTH AT BEDTIME 90 tablet 1   OXYGEN  Inhale 5 L/min into the lungs continuous.      predniSONE  (DELTASONE ) 10 MG tablet TAKE 2 TABLETS(20 MG) BY MOUTH DAILY (Patient taking differently: Take 10 mg by mouth 2 (two) times daily with a meal.) 60 tablet 11   simvastatin  (ZOCOR ) 40 MG tablet Take 1 tablet (40 mg total) by mouth daily. 90 tablet 3   sulfamethoxazole -trimethoprim  (BACTRIM  DS) 800-160 MG tablet TAKE 1 TABLET BY MOUTH AS A SINGLE DOSE. TAKE 1 TABLET ON MONDAY, WEDNESDAY, AND FRIDAY 12 tablet 5   SYSTANE COMPLETE PF 0.6 % SOLN Place 1 drop into both eyes 4 (four) times daily.     triamcinolone  cream (KENALOG ) 0.1 % Apply 1 application  topically daily as needed (for dryness/itching (affected areas)).     No current facility-administered medications on file prior to visit.     Review of Systems  Constitutional:  Negative for appetite change and fever.  Respiratory:  Positive for cough (occ brings up phlegm) and shortness of breath. Negative for wheezing.   Cardiovascular:  Negative for chest pain, palpitations and leg swelling.  Neurological:  Negative for light-headedness and headaches.  Psychiatric/Behavioral:  Negative for sleep disturbance. The patient is nervous/anxious.  Objective:   Vitals:   04/01/23 1203  BP: (!) 150/82  Pulse: 70  Temp: 98.3 F (36.8 C)  SpO2: 100%   BP Readings from Last 3 Encounters:  04/01/23 (!) 150/82  03/21/23 (!) 164/69  01/28/23 (!) 168/73   Wt Readings from Last 3 Encounters:  03/21/23 171 lb 9.6 oz (77.8 kg)  01/28/23 169 lb 6.4 oz (76.8 kg)  01/09/23 168 lb (76.2 kg)   Body mass index is 23.93 kg/m.    Physical Exam Constitutional:      General: He is not in acute distress.    Appearance: Normal appearance. He is not ill-appearing.  HENT:     Head: Normocephalic and atraumatic.  Eyes:     Conjunctiva/sclera: Conjunctivae normal.  Cardiovascular:     Rate and Rhythm: Normal rate and regular rhythm.     Heart sounds: Normal heart sounds.  Pulmonary:     Effort: Pulmonary effort is normal. No  respiratory distress.     Breath sounds: Normal breath sounds. No wheezing or rales.  Musculoskeletal:     Right lower leg: No edema.     Left lower leg: No edema.  Skin:    General: Skin is warm and dry.     Findings: No rash.  Neurological:     Mental Status: He is alert. Mental status is at baseline.  Psychiatric:        Mood and Affect: Mood normal.        Lab Results  Component Value Date   WBC 6.8 09/28/2022   HGB 10.2 (L) 09/28/2022   HCT 31.9 (L) 09/28/2022   PLT 161.0 09/28/2022   GLUCOSE 206 (H) 11/13/2022   CHOL 165 09/28/2022   TRIG 156.0 (H) 09/28/2022   HDL 64.80 09/28/2022   LDLCALC 69 09/28/2022   ALT 12 09/28/2022   AST 15 09/28/2022   NA 135 11/13/2022   K 4.9 11/13/2022   CL 100 11/13/2022   CREATININE 2.57 (H) 11/13/2022   BUN 46 (H) 11/13/2022   CO2 26 11/13/2022   TSH 1.24 09/28/2022   PSA 1.42 07/16/2007   INR 1.2 01/21/2019   HGBA1C 6.8 (H) 09/28/2022   MICROALBUR 9.45 03/19/2018     Assessment & Plan:    See Problem List for Assessment and Plan of chronic medical problems.

## 2023-03-31 NOTE — Patient Instructions (Addendum)
      Blood work was ordered.       Medications changes include :   None     Return in about 6 months (around 09/29/2023) for follow up.

## 2023-04-01 ENCOUNTER — Ambulatory Visit (INDEPENDENT_AMBULATORY_CARE_PROVIDER_SITE_OTHER): Payer: No Typology Code available for payment source | Admitting: Internal Medicine

## 2023-04-01 VITALS — BP 150/82 | HR 70 | Temp 98.3°F | Ht 71.0 in

## 2023-04-01 DIAGNOSIS — E538 Deficiency of other specified B group vitamins: Secondary | ICD-10-CM

## 2023-04-01 DIAGNOSIS — R252 Cramp and spasm: Secondary | ICD-10-CM | POA: Insufficient documentation

## 2023-04-01 DIAGNOSIS — F0280 Dementia in other diseases classified elsewhere without behavioral disturbance: Secondary | ICD-10-CM

## 2023-04-01 DIAGNOSIS — F419 Anxiety disorder, unspecified: Secondary | ICD-10-CM

## 2023-04-01 DIAGNOSIS — N182 Chronic kidney disease, stage 2 (mild): Secondary | ICD-10-CM

## 2023-04-01 DIAGNOSIS — N184 Chronic kidney disease, stage 4 (severe): Secondary | ICD-10-CM

## 2023-04-01 DIAGNOSIS — E1122 Type 2 diabetes mellitus with diabetic chronic kidney disease: Secondary | ICD-10-CM

## 2023-04-01 DIAGNOSIS — I10 Essential (primary) hypertension: Secondary | ICD-10-CM | POA: Diagnosis not present

## 2023-04-01 DIAGNOSIS — G309 Alzheimer's disease, unspecified: Secondary | ICD-10-CM | POA: Diagnosis not present

## 2023-04-01 DIAGNOSIS — F0150 Vascular dementia without behavioral disturbance: Secondary | ICD-10-CM

## 2023-04-01 DIAGNOSIS — E782 Mixed hyperlipidemia: Secondary | ICD-10-CM

## 2023-04-01 DIAGNOSIS — D500 Iron deficiency anemia secondary to blood loss (chronic): Secondary | ICD-10-CM

## 2023-04-01 LAB — COMPREHENSIVE METABOLIC PANEL
ALT: 18 U/L (ref 0–53)
AST: 17 U/L (ref 0–37)
Albumin: 4.3 g/dL (ref 3.5–5.2)
Alkaline Phosphatase: 50 U/L (ref 39–117)
BUN: 54 mg/dL — ABNORMAL HIGH (ref 6–23)
CO2: 31 meq/L (ref 19–32)
Calcium: 9.1 mg/dL (ref 8.4–10.5)
Chloride: 102 meq/L (ref 96–112)
Creatinine, Ser: 2.72 mg/dL — ABNORMAL HIGH (ref 0.40–1.50)
GFR: 20.13 mL/min — ABNORMAL LOW (ref >60.00)
Glucose, Bld: 179 mg/dL — ABNORMAL HIGH (ref 70–99)
Potassium: 5.7 meq/L — ABNORMAL HIGH (ref 3.5–5.1)
Sodium: 142 meq/L (ref 135–145)
Total Bilirubin: 0.3 mg/dL (ref 0.2–1.2)
Total Protein: 6.6 g/dL (ref 6.0–8.3)

## 2023-04-01 LAB — IBC PANEL
Iron: 155 ug/dL (ref 42–165)
Saturation Ratios: 38 % (ref 20.0–50.0)
TIBC: 407.4 ug/dL (ref 250.0–450.0)
Transferrin: 291 mg/dL (ref 212.0–360.0)

## 2023-04-01 LAB — LIPID PANEL
Cholesterol: 163 mg/dL (ref 0–200)
HDL: 68.2 mg/dL (ref >39.00)
LDL Cholesterol: 68 mg/dL (ref 0–99)
NonHDL: 94.3
Total CHOL/HDL Ratio: 2
Triglycerides: 134 mg/dL (ref 0.0–149.0)
VLDL: 26.8 mg/dL (ref 0.0–40.0)

## 2023-04-01 LAB — MAGNESIUM: Magnesium: 2.3 mg/dL (ref 1.5–2.5)

## 2023-04-01 NOTE — Assessment & Plan Note (Signed)
Chronic Check B12 level Taking B12 daily 

## 2023-04-01 NOTE — Assessment & Plan Note (Signed)
Chronic CMP, lipid panel Continue simvastatin 40 mg daily

## 2023-04-01 NOTE — Assessment & Plan Note (Addendum)
 Chronic GFR stable Following with nephrology-Dr. Christianne Cowper Continue Lasix  40 mg daily Cmp, cbc

## 2023-04-01 NOTE — Assessment & Plan Note (Signed)
 Chronic With CKD  Lab Results  Component Value Date   HGBA1C 6.8 (H) 09/28/2022   Sugars controlled Testing sugars 1 times a day Continue lifestyle control

## 2023-04-01 NOTE — Assessment & Plan Note (Addendum)
 Chronic Following with neurology Memory stable Continue Namenda  5 mg twice daily

## 2023-04-01 NOTE — Assessment & Plan Note (Addendum)
 Chronic Blood pressure well-controlled at home, but elevated here No longer on medication-taking Lasix  40 mg daily Monitor BP

## 2023-04-01 NOTE — Assessment & Plan Note (Signed)
 Chronic Having muscle cramping in his legs primarily at night He is trying to drink enough fluid and probably drinks 3 bottles a day ?  Related to CKD, orthopedic-lumbar spine issue Continue symptomatic treatment

## 2023-04-01 NOTE — Assessment & Plan Note (Addendum)
 Chronic Anxiety controlled-some of his panic attacks that he was experiencing were likely issues with breathing related to his COPD, but others are not and he does still have some panic attacks Continue Lexapro  10 mg daily, Remeron  15 mg at bedtime

## 2023-04-01 NOTE — Assessment & Plan Note (Signed)
 Chronic History of GI bleed No symptoms consistent with active bleeding CBC, iron panel

## 2023-04-02 ENCOUNTER — Encounter: Payer: Self-pay | Admitting: Internal Medicine

## 2023-04-16 ENCOUNTER — Encounter: Payer: Self-pay | Admitting: Emergency Medicine

## 2023-04-26 ENCOUNTER — Ambulatory Visit (HOSPITAL_COMMUNITY)
Admission: RE | Admit: 2023-04-26 | Discharge: 2023-04-26 | Disposition: A | Discharge status: Home / Self Care | Payer: Medicare Other | Source: Ambulatory Visit | Attending: Radiology | Admitting: Radiology

## 2023-04-26 DIAGNOSIS — R918 Other nonspecific abnormal finding of lung field: Secondary | ICD-10-CM | POA: Diagnosis not present

## 2023-04-26 DIAGNOSIS — R911 Solitary pulmonary nodule: Secondary | ICD-10-CM | POA: Diagnosis not present

## 2023-04-26 DIAGNOSIS — J90 Pleural effusion, not elsewhere classified: Secondary | ICD-10-CM | POA: Diagnosis not present

## 2023-04-26 DIAGNOSIS — J929 Pleural plaque without asbestos: Secondary | ICD-10-CM | POA: Diagnosis not present

## 2023-04-26 DIAGNOSIS — Z85118 Personal history of other malignant neoplasm of bronchus and lung: Secondary | ICD-10-CM | POA: Diagnosis not present

## 2023-04-30 NOTE — Progress Notes (Signed)
 Radiation Oncology         ([phone] ________________________________  Name: Brian [name] MRN: [id number]  Date: 05/02/2023  DOB: 22-Jan-1932  Follow-Up Visit Note  CC: [name], MD  Leslye Peer, MD  No diagnosis found.  Diagnosis:  Pet positive Right upper lobe lung nodule, suspicious for primary bronchogenic carcinoma    Interval Since Last Radiation: 3 years, 1 month, and 16 days   Radiation Treatment Dates: 03/09/2020 through 03/16/2020   Site: Right lung Technique: SBRT Total Dose (Gy): 54/54 Dose per Fx (Gy): 18 Completed Fx: 3/3 Beam Energies: 6XFFF  Narrative:  The patient returns today for routine follow-up and to review most recent imaging.  He was last seen in office on 01-28-23. Since then, he presented for a follow up with Dr. Delton Coombes on 03-21-23 during which he reported dyspnea with normal and minimal activities. Prednisone 20 mg was beneficial in improving his dyspnea. He continues to use a cane to assist him in getting around. At that time, slowly enlarging left-side nodule was measuring 7 mm. Nodule will continue to be monitored.           Most recent CT chest done on 04-26-23 showed (result not yet available)                           No other significant oncologic interval history since the patient was last seen.   Allergies:  is allergic to meperidine hcl, sertraline, gabapentin, memantine, latex, and zolpidem tartrate.  Meds: Current Outpatient Medications  Medication Sig Dispense Refill   ACCU-CHEK COMPACT STRIPS test strip TEST BLOOD SUGAR ONCE DAILY 100 each 1   albuterol (VENTOLIN HFA) 108 (90 Base) MCG/ACT inhaler Inhale 2 puffs into the lungs every 6 (six) hours as needed for wheezing or shortness of breath. 18 g 0   ANORO ELLIPTA 62.5-25 MCG/ACT AEPB USE 1 INHALATION DAILY 240 each 3   Aspirin 81 MG EC tablet Take 81 mg by mouth daily.     Cholecalciferol (VITAMIN D3) 50 MCG (2000 UT) TABS Take 2,000 Units by mouth daily.      Cyanocobalamin (VITAMIN B12 PO) Take 1 tablet by mouth daily.     escitalopram (LEXAPRO) 10 MG tablet Take 10 mg by mouth. Pt take 15mg      ferrous sulfate 325 (65 FE) MG tablet Take 1 tablet (325 mg total) by mouth daily with breakfast. 90 tablet 1   furosemide (LASIX) 40 MG tablet TAKE 1 TABLET(40 MG) BY MOUTH DAILY 90 tablet 3   Magnesium Oxide 420 MG TABS Take 420 mg by mouth daily.      memantine (NAMENDA) 10 MG tablet Take 1 tablet (10 mg) twice a day (Patient taking differently: Take 10 mg by mouth daily.) 60 tablet 11   mirtazapine (REMERON) 15 MG tablet TAKE 1 TABLET(15 MG) BY MOUTH AT BEDTIME 90 tablet 1   OXYGEN Inhale 5 L/min into the lungs continuous.     predniSONE (DELTASONE) 10 MG tablet TAKE 2 TABLETS(20 MG) BY MOUTH DAILY (Patient taking differently: Take 10 mg by mouth 2 (two) times daily with a meal.) 60 tablet 11   simvastatin (ZOCOR) 40 MG tablet Take 1 tablet (40 mg total) by mouth daily. 90 tablet 3   sulfamethoxazole-trimethoprim (BACTRIM DS) 800-160 MG tablet TAKE 1 TABLET BY MOUTH AS A SINGLE DOSE. TAKE 1 TABLET ON MONDAY, WEDNESDAY, AND FRIDAY 12 tablet 5   SYSTANE COMPLETE PF 0.6 %  SOLN Place 1 drop into both eyes 4 (four) times daily.     triamcinolone cream (KENALOG) 0.1 % Apply 1 application  topically daily as needed (for dryness/itching (affected areas)).     No current facility-administered medications for this encounter.    Physical Findings: The patient is in no acute distress. Patient is alert and oriented.  vitals were not taken for this visit. .  No significant changes. Lungs are clear to auscultation bilaterally. Heart has regular rate and rhythm. No palpable cervical, supraclavicular, or axillary adenopathy. Abdomen soft, non-tender, normal bowel sounds.   Lab Findings: Lab Results  Component Value Date   WBC 6.8 09/28/2022   HGB 10.2 (L) 09/28/2022   HCT 31.9 (L) 09/28/2022   MCV 97.6 09/28/2022   PLT 161.0 09/28/2022    Radiographic  Findings: No results found.  Impression: Pet positive Right upper lobe lung nodule, suspicious for primary bronchogenic carcinoma   The patient is recovering from the effects of radiation.  ***  Plan:  ***   *** minutes of total time was spent for this patient encounter, including preparation, face-to-face counseling with the patient and coordination of care, physical exam, and documentation of the encounter. ____________________________________  Billie Lade, PhD, MD  This document serves as a record of services personally performed by Antony Blackbird, MD. It was created on his behalf by Herbie Saxon, a trained medical scribe. The creation of this record is based on the scribe's personal observations and the provider's statements to them. This document has been checked and approved by the attending provider.

## 2023-05-01 DIAGNOSIS — E785 Hyperlipidemia, unspecified: Secondary | ICD-10-CM | POA: Diagnosis not present

## 2023-05-01 DIAGNOSIS — N2581 Secondary hyperparathyroidism of renal origin: Secondary | ICD-10-CM | POA: Diagnosis not present

## 2023-05-01 DIAGNOSIS — C3490 Malignant neoplasm of unspecified part of unspecified bronchus or lung: Secondary | ICD-10-CM | POA: Diagnosis not present

## 2023-05-01 DIAGNOSIS — I129 Hypertensive chronic kidney disease with stage 1 through stage 4 chronic kidney disease, or unspecified chronic kidney disease: Secondary | ICD-10-CM | POA: Diagnosis not present

## 2023-05-01 DIAGNOSIS — N184 Chronic kidney disease, stage 4 (severe): Secondary | ICD-10-CM | POA: Diagnosis not present

## 2023-05-01 DIAGNOSIS — D631 Anemia in chronic kidney disease: Secondary | ICD-10-CM | POA: Diagnosis not present

## 2023-05-01 DIAGNOSIS — N189 Chronic kidney disease, unspecified: Secondary | ICD-10-CM | POA: Diagnosis not present

## 2023-05-01 DIAGNOSIS — J449 Chronic obstructive pulmonary disease, unspecified: Secondary | ICD-10-CM | POA: Diagnosis not present

## 2023-05-01 DIAGNOSIS — E1122 Type 2 diabetes mellitus with diabetic chronic kidney disease: Secondary | ICD-10-CM | POA: Diagnosis not present

## 2023-05-02 ENCOUNTER — Encounter: Payer: Self-pay | Admitting: Radiation Oncology

## 2023-05-02 ENCOUNTER — Ambulatory Visit
Admission: RE | Admit: 2023-05-02 | Discharge: 2023-05-02 | Disposition: A | Discharge status: Home / Self Care | Payer: Self-pay | Source: Ambulatory Visit | Attending: Radiation Oncology | Admitting: Radiation Oncology

## 2023-05-02 VITALS — BP 163/79 | HR 75 | Temp 96.9°F | Resp 20 | Ht 71.0 in | Wt 170.1 lb

## 2023-05-02 DIAGNOSIS — R911 Solitary pulmonary nodule: Secondary | ICD-10-CM | POA: Diagnosis not present

## 2023-05-02 DIAGNOSIS — R918 Other nonspecific abnormal finding of lung field: Secondary | ICD-10-CM | POA: Insufficient documentation

## 2023-05-02 DIAGNOSIS — Z79899 Other long term (current) drug therapy: Secondary | ICD-10-CM | POA: Insufficient documentation

## 2023-05-02 DIAGNOSIS — I700 Atherosclerosis of aorta: Secondary | ICD-10-CM | POA: Insufficient documentation

## 2023-05-02 DIAGNOSIS — Z7952 Long term (current) use of systemic steroids: Secondary | ICD-10-CM | POA: Insufficient documentation

## 2023-05-02 DIAGNOSIS — Z7982 Long term (current) use of aspirin: Secondary | ICD-10-CM | POA: Diagnosis not present

## 2023-05-02 DIAGNOSIS — J90 Pleural effusion, not elsewhere classified: Secondary | ICD-10-CM | POA: Diagnosis not present

## 2023-05-02 NOTE — Progress Notes (Signed)
 Brian [name] is here today for follow up post radiation to the lung and to receive CT scan results.  Lung Side: Right, patient completed treatment on 03/16/20.  Does the patient complain of any of the following: Pain: Denies Shortness of breath w/wo exertion: Denies but is on 5l when resting. Cough: Denies Hemoptysis: Denies Pain with swallowing: Denies Swallowing/choking concerns: Denies Appetite: Good Energy Level: Good Post radiation skin Changes: Denies    BP (!) 163/79 (BP Location: Left Arm, Patient Position: Sitting)   Pulse 75   Temp (!) 96.9 F (36.1 C) (Temporal)   Resp 20   Ht 5\' 11"  (1.803 m)   Wt 170 lb 2 oz (77.2 kg)   SpO2 100%   BMI 23.73 kg/m

## 2023-05-03 ENCOUNTER — Encounter: Payer: Self-pay | Admitting: Cardiovascular Disease

## 2023-05-03 MED ORDER — FUROSEMIDE 40 MG PO TABS: ORAL_TABLET | ORAL | 3 refills | Status: DC

## 2023-05-07 ENCOUNTER — Encounter: Payer: Self-pay | Admitting: Emergency Medicine

## 2023-05-07 DIAGNOSIS — J439 Emphysema, unspecified: Secondary | ICD-10-CM

## 2023-05-09 ENCOUNTER — Ambulatory Visit: Payer: Medicare Other

## 2023-05-09 VITALS — Ht 71.0 in | Wt 170.0 lb

## 2023-05-09 DIAGNOSIS — Z0000 Encounter for general adult medical examination without abnormal findings: Secondary | ICD-10-CM | POA: Diagnosis not present

## 2023-05-09 NOTE — Patient Instructions (Addendum)
 Mr. Chianese , Thank you for taking time to come for your Medicare Wellness Visit. I appreciate your ongoing commitment to your health goals. Please review the following plan we discussed and let me know if I can assist you in the future.   Referrals/Orders/Follow-Ups/Clinician Recommendations: Aim for 30 minutes of exercise or brisk walking, 6-8 glasses of water, and 5 servings of fruits and vegetables each day.   This is a list of the screening recommended for you and due dates:  Health Maintenance  Topic Date Due   Eye exam for diabetics  11/24/2019   Complete foot exam   02/08/2022   COVID-19 Vaccine (7 - 2024-25 season) 10/21/2022   Hemoglobin A1C  03/31/2023   Medicare Annual Wellness Visit  05/08/2024   DTaP/Tdap/Td vaccine (5 - Td or Tdap) 04/26/2031   Pneumonia Vaccine  Completed   Flu Shot  Completed   Zoster (Shingles) Vaccine  Completed   HPV Vaccine  Aged Out    Advanced directives: (Copy Requested) Please bring a copy of your health care power of attorney and living will to the office to be added to your chart at your convenience. You can mail to [facility] [address] [place], [place] [address] or email to [email or web address]  Next Medicare Annual Wellness Visit scheduled for next year: Yes

## 2023-05-09 NOTE — Progress Notes (Signed)
 Subjective:  Please attest and cosign this visit due to patients primary care provider not being in the office at the time the visit was completed.  (Pt of Dr Lawerance Bach)   Brian [name] is a 89 y.o. who presents for a Medicare Wellness preventive visit.  Visit Complete: Virtual I connected with  Brian [name] on 05/09/23 by a audio enabled telemedicine application and verified that I am speaking with the correct person using two identifiers.  Patient Location: Home  Provider Location: Office/Clinic  I discussed the limitations of evaluation and management by telemedicine. The patient expressed understanding and agreed to proceed.  Vital Signs: Because this visit was a virtual/telehealth visit, some criteria may be missing or patient reported. Any vitals not documented were not able to be obtained and vitals that have been documented are patient reported.  VideoDeclined- This patient declined Librarian, academic. Therefore the visit was completed with audio only.  Persons Participating in Visit: Patient assisted by Flint Melter - daughter.  AWV Questionnaire: No: Patient Medicare AWV questionnaire was not completed prior to this visit.  Cardiac Risk Factors include: advanced age (>71men, >33 women);dyslipidemia;diabetes mellitus;hypertension;male gender     Objective:    Today's Vitals   05/09/23 1400  Weight: 170 lb (77.1 kg)  Height: 5\' 11"  (1.803 m)   Body mass index is 23.71 kg/m.     05/09/2023    1:58 PM 01/28/2023   12:04 PM 07/26/2022   11:15 AM 05/08/2022    1:22 PM 04/02/2022   12:30 PM 01/18/2022   10:46 AM 11/15/2021   10:57 AM  Advanced Directives  Does Patient Have a Medical Advance Directive? Yes Yes Yes No Yes Yes No  Type of Estate agent of [place];Living will Healthcare Power of [place];Living will Healthcare Power of [place];Living will  Healthcare Power of [place];Living will Healthcare Power of  [place];Living will   Does patient want to make changes to medical advance directive?   No - Patient declined      Copy of Healthcare Power of Attorney in Chart? No - copy requested  No - copy requested   No - copy requested   Would patient like information on creating a medical advance directive?    No - Patient declined       Current Medications (verified) Outpatient Encounter Medications as of 05/09/2023  Medication Sig   ACCU-CHEK COMPACT STRIPS test strip TEST BLOOD SUGAR ONCE DAILY   albuterol (VENTOLIN HFA) 108 (90 Base) MCG/ACT inhaler Inhale 2 puffs into the lungs every 6 (six) hours as needed for wheezing or shortness of breath.   ANORO ELLIPTA 62.5-25 MCG/ACT AEPB USE 1 INHALATION DAILY   Aspirin 81 MG EC tablet Take 81 mg by mouth daily.   Cholecalciferol (VITAMIN D3) 50 MCG (2000 UT) TABS Take 2,000 Units by mouth daily.   Cyanocobalamin (VITAMIN B12 PO) Take 1 tablet by mouth daily.   escitalopram (LEXAPRO) 10 MG tablet Take 10 mg by mouth. Pt take 15mg    ferrous sulfate 325 (65 FE) MG tablet Take 1 tablet (325 mg total) by mouth daily with breakfast.   furosemide (LASIX) 40 MG tablet 40 mg alternating with 20 mg every other day   Magnesium Oxide 420 MG TABS Take 420 mg by mouth daily.    memantine (NAMENDA) 10 MG tablet Take 1 tablet (10 mg) twice a day (Patient taking differently: Take 10 mg by mouth daily.)   mirtazapine (REMERON) 15 MG tablet TAKE 1  TABLET(15 MG) BY MOUTH AT BEDTIME   OXYGEN Inhale 5 L/min into the lungs continuous.   predniSONE (DELTASONE) 10 MG tablet TAKE 2 TABLETS(20 MG) BY MOUTH DAILY (Patient taking differently: Take 10 mg by mouth 2 (two) times daily with a meal.)   simvastatin (ZOCOR) 40 MG tablet Take 1 tablet (40 mg total) by mouth daily.   sulfamethoxazole-trimethoprim (BACTRIM DS) 800-160 MG tablet TAKE 1 TABLET BY MOUTH AS A SINGLE DOSE. TAKE 1 TABLET ON MONDAY, WEDNESDAY, AND FRIDAY   SYSTANE COMPLETE PF 0.6 % SOLN Place 1 drop into both eyes  4 (four) times daily.   triamcinolone cream (KENALOG) 0.1 % Apply 1 application  topically daily as needed (for dryness/itching (affected areas)).   No facility-administered encounter medications on file as of 05/09/2023.    Allergies (verified) Meperidine hcl, Sertraline, Gabapentin, Memantine, Latex, and Zolpidem tartrate   History: Past Medical History:  Diagnosis Date   AAA (abdominal aortic aneurysm) (HCC)    Arthritis    Asthma 1976   Bronchiolitis 06/2014   CAD (coronary artery disease)    NSTEMI 02/2010;  s/p CABG 1/12 (L-LAD, SOM1, S-PDA); echo in 08/2010: Mild LVH, EF 55%, grade 2 diastolic dysfunction, mild MR, mild LAE, mildly reduced RV function, mild RAE, PASP 42   Carpal tunnel syndrome of left wrist    Cecal angiodysplasia 04/04/2022   Colon polyps    COPD (chronic obstructive pulmonary disease) (HCC)    Diabetes mellitus 2000   Type II, controlls with diet   Dyspnea    ED (erectile dysfunction)    Gout    History of radiation therapy 03/09/2020-03/16/2020   SBRT right lung: Dr. Antony Blackbird   HTN (hypertension)    Hyperlipidemia    Inguinal hernia    Right   Macular degeneration    Myocardial infarction [facility]) 1 -13-2012   Neuropathy    Past Surgical History:  Procedure Laterality Date   3 vessel CABG  03/03/2010   ABDOMINAL AORTIC ENDOVASCULAR STENT GRAFT N/A 01/21/2019   Procedure: ABDOMINAL AORTIC ENDOVASCULAR STENT GRAFT;  Surgeon: [name], MD;  Location[facility];  Service: Vascular;  Laterality: N/A;   cataract left eye     COLONOSCOPY     neg 2009   COLONOSCOPY  2014   5 mm sessile polyp; AVM   COLONOSCOPY WITH PROPOFOL N/A 04/04/2022   Procedure: COLONOSCOPY WITH PROPOFOL;  Surgeon: [name], MD;  Location: [facility] ENDOSCOPY;  Service: Gastroenterology;  Laterality: N/A;   CORONARY ARTERY BYPASS GRAFT  2012   X3   ENDOVASCULAR REPAIR/STENT GRAFT  01/21/2019   ABDOMINAL AORTIC ENDOVASCULAR STENT GRAFT (N/A )    ESOPHAGOGASTRODUODENOSCOPY (EGD) WITH PROPOFOL N/A 04/04/2022   Procedure: ESOPHAGOGASTRODUODENOSCOPY (EGD) WITH PROPOFOL;  Surgeon: [name], MD;  Location: [facility] ENDOSCOPY;  Service: Gastroenterology;  Laterality: N/A;   EYE SURGERY     Left cataract   HERNIA REPAIR     HOT HEMOSTASIS N/A 04/04/2022   Procedure: HOT HEMOSTASIS (ARGON PLASMA COAGULATION/BICAP);  Surgeon: [name], MD;  Location: [facility] ENDOSCOPY;  Service: Gastroenterology;  Laterality: N/A;   INCISION AND DRAINAGE PERIRECTAL ABSCESS     INGUINAL HERNIA REPAIR     Family History  Problem Relation Age of Onset   Heart failure Mother    Heart disease Mother        CHF   Heart attack Mother    Hypertension Mother    Heart attack Father    Heart disease Father 64  MI   Hypertension Father    Heart attack Sister 76   Heart disease Sister 27       MI   Diabetes Sister    Hyperlipidemia Sister    Hypertension Sister    Varicose Veins Sister    Heart attack Brother 62   Diabetes Brother    Heart disease Brother 44       MI   Cancer Brother    Hypertension Brother    Deep vein thrombosis Daughter    Leukemia Daughter    Other Son        varicose veins   Arthritis/Rheumatoid Son    Lupus Daughter    Colon cancer Neg Hx    Social History   Socioeconomic History   Marital status: Widowed    Spouse name: Not on file   Number of children: 3   Years of education: 62   Highest education level: 12th grade  Occupational History   Occupation: RETIRED    Employer: RETIRED  Tobacco Use   Smoking status: Former    Current packs/day: 0.00    Average packs/day: 1 pack/day for 40.0 years (40.0 ttl pk-yrs)    Types: Cigarettes    Start date: 02/20/1963    Quit date: 02/20/2003    Years since quitting: 20.2    Passive exposure: Past   Smokeless tobacco: Never  Vaping Use   Vaping status: Never Used  Substance and Sexual Activity   Alcohol use: Not Currently    Alcohol/week: 1.0 standard drink of alcohol     Types: 1 Shots of liquor per week    Comment: 1 drinks a week   Drug use: No   Sexual activity: Not on file  Other Topics Concern   Not on file  Social History Narrative   Quit smoking in 1998. Retired Tax adviser. Regular exercise- no.    One story home   Right handed   3 children   Drinks caffeine   Social Drivers of Corporate investment banker Strain: Low Risk  (05/09/2023)   Overall Financial Resource Strain (CARDIA)    Difficulty of Paying Living Expenses: Not hard at all  Food Insecurity: No Food Insecurity (05/09/2023)   Hunger Vital Sign    Worried About Running Out of Food in the Last Year: Never true    Ran Out of Food in the Last Year: Never true  Transportation Needs: No Transportation Needs (05/09/2023)   PRAPARE - Administrator, Civil Service (Medical): No    Lack of Transportation (Non-Medical): No  Physical Activity: Insufficiently Active (05/09/2023)   Exercise Vital Sign    Days of Exercise per Week: 7 days    Minutes of Exercise per Session: 20 min  Stress: No Stress Concern Present (05/09/2023)   [employer] of Occupational Health - Occupational Stress Questionnaire    Feeling of Stress : Not at all  Recent Concern: Stress - Stress Concern Present (04/01/2023)   [employer] of Occupational Health - Occupational Stress Questionnaire    Feeling of Stress : Rather much  Social Connections: Socially Isolated (05/09/2023)   Social Connection and Isolation Panel [NHANES]    Frequency of Communication with Friends and Family: More than three times a week    Frequency of Social Gatherings with Friends and Family: Never    Attends Religious Services: Never    Database administrator or Organizations: No    Attends Banker Meetings: Never  Marital Status: Widowed    Tobacco Counseling Counseling given: Not Answered    Clinical Intake:  Pre-visit preparation completed: Yes  Pain : No/denies pain     BMI - recorded:  23.71 Nutritional Status: BMI of 19-24  Normal Nutritional Risks: None Diabetes: Yes CBG done?: No (Fasting reading - 139 on 05/08/2023) Did pt. bring in CBG monitor from home?: No  Lab Results  Component Value Date   HGBA1C 6.8 (H) 09/28/2022   HGBA1C 6.5 03/30/2022   HGBA1C 6.7 (H) 09/27/2021     How often do you need to have someone help you when you read instructions, pamphlets, or other written materials from your doctor or pharmacy?: 1 - Never  Interpreter Needed?: No  Information entered by :: Hassell Halim, CMA   Activities of Daily Living     05/09/2023    2:04 PM  In your present state of health, do you have any difficulty performing the following activities:  Hearing? 0  Vision? 0  Difficulty concentrating or making decisions? 0  Walking or climbing stairs? 0  Dressing or bathing? 0  Doing errands, shopping? 0  Preparing Food and eating ? N  Using the Toilet? N  In the past six months, have you accidently leaked urine? N  Do you have problems with loss of bowel control? N  Managing your Medications? N  Managing your Finances? N  Housekeeping or managing your Housekeeping? N    Patient Care Team: [name], MD as PCP - General (Internal Medicine) Kathleene Hazel, MD as PCP - Cardiology (Cardiology) Glendale Chard, DO as Consulting Physician (Neurology) Leslye Peer, MD as Consulting Physician (Pulmonary Disease)  Indicate any recent Medical Services you may have received from other than [facility] providers in the past year (date may be approximate).     Assessment:   This is a routine wellness examination for [place].  Hearing/Vision screen Hearing Screening - Comments:: Denies hearing difficulties   Vision Screening - Comments:: Wears rx glasses - up to date with routine eye exams with [facility] in [place], [place]   Goals Addressed               This Visit's Progress     Patient Stated (pt-stated)        Patient stated he plans  to stay active.       Depression Screen     05/09/2023    2:06 PM 04/01/2023   12:06 PM 09/28/2022   10:31 AM 05/22/2022    1:13 PM 04/10/2022    1:37 PM 03/30/2022   10:42 AM 09/27/2021    8:42 AM  PHQ 2/9 Scores  PHQ - 2 Score 0 0 0 0 0 0 3  PHQ- 9 Score 0  0 0 0 0 7    Fall Risk     05/09/2023    2:07 PM 04/01/2023   12:05 PM 03/21/2023   10:56 AM 09/28/2022   10:31 AM 05/22/2022    1:13 PM  Fall Risk   Falls in the past year? 0 0 0 0 0  Number falls in past yr: 0 0  0 0  Injury with Fall? 0 0  0 0  Risk for fall due to : No Fall Risks No Fall Risks  No Fall Risks No Fall Risks  Follow up Falls prevention discussed;Falls evaluation completed Falls evaluation completed  Falls evaluation completed Falls evaluation completed    MEDICARE RISK AT HOME:  Medicare Risk at  Home Any stairs in or around the home?: No If so, are there any without handrails?: No Home free of loose throw rugs in walkways, pet beds, electrical cords, etc?: Yes Adequate lighting in your home to reduce risk of falls?: Yes Life alert?: Yes Use of a cane, walker or w/c?: No Grab bars in the bathroom?: No Shower chair or bench in shower?: No Elevated toilet seat or a handicapped toilet?: No  TIMED UP AND GO:  Was the test performed?  No  Cognitive Function: 6CIT completed      10/04/2021    2:00 PM  Montreal Cognitive Assessment   Visuospatial/ Executive (0/5) 2  Naming (0/3) 2  Attention: Read list of digits (0/2) 1  Attention: Read list of letters (0/1) 1  Attention: Serial 7 subtraction starting at 100 (0/3) 1  Language: Repeat phrase (0/2) 0  Language : Fluency (0/1) 1  Abstraction (0/2) 1  Delayed Recall (0/5) 1  Orientation (0/6) 6  Total 16  Adjusted Score (based on education) 16      05/09/2023    2:09 PM 08/24/2021    4:18 PM  6CIT Screen  What Year? 0 points 0 points  What month? 0 points 0 points  What time? 0 points 0 points  Count back from 20 0 points 0 points  Months in reverse  0 points 0 points  Repeat phrase 0 points 0 points  Total Score 0 points 0 points    Immunizations Immunization History  Administered Date(s) Administered   Fluad Quad(high Dose 65+) 10/15/2018, 12/02/2019, 10/19/2020   Fluad Trivalent(High Dose 65+) 11/08/2022   Influenza Split 12/21/2010, 12/05/2011   Influenza Whole 01/01/2007, 12/09/2008, 11/24/2009   Influenza, High Dose Seasonal PF 11/08/2015, 11/22/2017, 11/13/2021   Influenza,inj,Quad PF,6+ Mos 11/20/2012, 10/29/2013, 11/23/2014   Influenza-Unspecified 12/11/2016, 10/21/2019, 10/19/2020   Moderna Covid-19 Fall Seasonal Vaccine 31yrs & older 04/26/2022   Moderna Covid-19 Vaccine Bivalent Booster 1yrs & up 11/17/2020   Moderna Sars-Covid-2 Vaccination 03/15/2019, 04/12/2019, 12/24/2019, 06/10/2020   Pneumococcal Conjugate-13 03/10/2014, 03/09/2015   Pneumococcal Polysaccharide-23 02/20/2008   Pneumococcal-Unspecified 10/21/2002, 04/28/2008   Td 07/16/2007, 04/25/2021   Td (Adult),5 Lf Tetanus Toxid, Preservative Free 04/25/2021   Tdap 02/26/2011   Zoster Recombinant(Shingrix) 10/03/2016, 12/02/2016   Zoster, Live 07/16/2007    Screening Tests Health Maintenance  Topic Date Due   OPHTHALMOLOGY EXAM  11/24/2019   FOOT EXAM  02/08/2022   COVID-19 Vaccine (7 - 2024-25 season) 10/21/2022   HEMOGLOBIN A1C  03/31/2023   Medicare Annual Wellness (AWV)  05/08/2024   DTaP/Tdap/Td (5 - Td or Tdap) 04/26/2031   Pneumonia Vaccine 7+ Years old  Completed   INFLUENZA VACCINE  Completed   Zoster Vaccines- Shingrix  Completed   HPV VACCINES  Aged Out    Health Maintenance  Health Maintenance Due  Topic Date Due   OPHTHALMOLOGY EXAM  11/24/2019   FOOT EXAM  02/08/2022   COVID-19 Vaccine (7 - 2024-25 season) 10/21/2022   HEMOGLOBIN A1C  03/31/2023   Health Maintenance Items Addressed: 05/09/2023   Additional Screening:  Vision Screening: Recommended annual ophthalmology exams for early detection of glaucoma and other  disorders of the eye.  Pt is followed at the [facility] in [place], [place].    Dental Screening: Recommended annual dental exams for proper oral hygiene  Community Resource Referral / Chronic Care Management: CRR required this visit?  No   CCM required this visit?  No     Plan:     I  have personally reviewed and noted the following in the patient's chart:   Medical and social history Use of alcohol, tobacco or illicit drugs  Current medications and supplements including opioid prescriptions. Patient is not currently taking opioid prescriptions. Functional ability and status Nutritional status Physical activity Advanced directives List of other physicians Hospitalizations, surgeries, and ER visits in previous 12 months Vitals Screenings to include cognitive, depression, and falls Referrals and appointments  In addition, I have reviewed and discussed with patient certain preventive protocols, quality metrics, and best practice recommendations. A written personalized care plan for preventive services as well as general preventive health recommendations were provided to patient.     Darreld Mclean, CMA   05/09/2023   After Visit Summary: (MyChart) Due to this being a telephonic visit, the after visit summary with patients personalized plan was offered to patient via MyChart   Notes: Nothing significant to report at this time.

## 2023-05-10 ENCOUNTER — Other Ambulatory Visit: Payer: Self-pay

## 2023-05-10 DIAGNOSIS — I714 Abdominal aortic aneurysm, without rupture, unspecified: Secondary | ICD-10-CM

## 2023-05-21 NOTE — Progress Notes (Unsigned)
 HISTORY AND PHYSICAL     CC:  follow up. For EVAR Requesting Provider:  [name], MD  HPI: This is a 89 y.o. male who is here today for follow up for AAA and is s/p EVAR on  02/08/2019 by Brian [name].  Brian [name] was last seen 05/16/2022 and at that time, he was not having any abdominal or back pain.  He was not having any pain in his legs or feet. He was on 5LO2NC and being followed by Pulmonary.  The Brian [name] returns today for follow up studies.   The Brian [name] is on a statin for cholesterol management.    The Brian [name] is on an aspirin.    Other AC:  none The Brian [name] is on diuretic for hypertension.  The Brian [name] is not on medication for diabetes. Tobacco hx:  former  Brian [name] does *** have family hx of AAA.  Past Medical History:  Diagnosis Date   AAA (abdominal aortic aneurysm) (HCC)    Arthritis    Asthma 1976   Bronchiolitis 06/2014   CAD (coronary artery disease)    NSTEMI 02/2010;  s/p CABG 1/12 (L-LAD, SOM1, S-PDA); echo in 08/2010: Mild LVH, EF 55%, grade 2 diastolic dysfunction, mild MR, mild LAE, mildly reduced RV function, mild RAE, PASP 42   Carpal tunnel syndrome of left wrist    Cecal angiodysplasia 04/04/2022   Colon polyps    COPD (chronic obstructive pulmonary disease) (HCC)    Diabetes mellitus 2000   Type II, controlls with diet   Dyspnea    ED (erectile dysfunction)    Gout    History of radiation therapy 03/09/2020-03/16/2020   SBRT right lung: Dr. Antony Blackbird   HTN (hypertension)    Hyperlipidemia    Inguinal hernia    Right   Macular degeneration    Myocardial infarction [facility]) 1 -13-2012   Neuropathy     Past Surgical History:  Procedure Laterality Date   3 vessel CABG  03/03/2010   ABDOMINAL AORTIC ENDOVASCULAR STENT GRAFT N/A 01/21/2019   Procedure: ABDOMINAL AORTIC ENDOVASCULAR STENT GRAFT;  Surgeon: [name], MD;  Location: [facility] OR;  Service: Vascular;  Laterality: N/A;   cataract left eye     COLONOSCOPY     neg 2009   COLONOSCOPY  2014   5 mm sessile  polyp; AVM   COLONOSCOPY WITH PROPOFOL N/A 04/04/2022   Procedure: COLONOSCOPY WITH PROPOFOL;  Surgeon: [name], MD;  Location: [facility];  Service: Gastroenterology;  Laterality: N/A;   CORONARY ARTERY BYPASS GRAFT  2012   X3   ENDOVASCULAR REPAIR/STENT GRAFT  01/21/2019   ABDOMINAL AORTIC ENDOVASCULAR STENT GRAFT (N/A )   ESOPHAGOGASTRODUODENOSCOPY (EGD) WITH PROPOFOL N/A 04/04/2022   Procedure: ESOPHAGOGASTRODUODENOSCOPY (EGD) WITH PROPOFOL;  Surgeon: [name], MD;  Location: [facility];  Service: Gastroenterology;  Laterality: N/A;   EYE SURGERY     Left cataract   HERNIA REPAIR     HOT HEMOSTASIS N/A 04/04/2022   Procedure: HOT HEMOSTASIS (ARGON PLASMA COAGULATION/BICAP);  Surgeon: [name], MD;  Location: [facility];  Service: Gastroenterology;  Laterality: N/A;   INCISION AND DRAINAGE PERIRECTAL ABSCESS     INGUINAL HERNIA REPAIR      Allergies  Allergen Reactions   Meperidine Hcl Swelling and Other (See Comments)    Tongue swelling Because of a history of documented adverse serious drug reaction, Medi Alert bracelet  is recommended.   Sertraline Shortness Of Breath and Other (See Comments)  Burning sensation from feet to head, diff breathing   Gabapentin Other (See Comments)    Confusion    Memantine Other (See Comments)    Can take only up to 5 mg a day- causes lethargy past that amount   Latex Rash and Other (See Comments)    Minor rash   Zolpidem Tartrate Other (See Comments)    Nightmares    Current Outpatient Medications  Medication Sig Dispense Refill   ACCU-CHEK COMPACT STRIPS test strip TEST BLOOD SUGAR ONCE DAILY 100 each 1   albuterol (VENTOLIN HFA) 108 (90 Base) MCG/ACT inhaler Inhale 2 puffs into the lungs every 6 (six) hours as needed for wheezing or shortness of breath. 18 g 0   ANORO ELLIPTA 62.5-25 MCG/ACT AEPB USE 1 INHALATION DAILY 240 each 3   Aspirin 81 MG EC tablet Take 81 mg by mouth daily.     Cholecalciferol  (VITAMIN D3) 50 MCG (2000 UT) TABS Take 2,000 Units by mouth daily.     Cyanocobalamin (VITAMIN B12 PO) Take 1 tablet by mouth daily.     escitalopram (LEXAPRO) 10 MG tablet Take 10 mg by mouth. Brian [name] take 15mg      ferrous sulfate 325 (65 FE) MG tablet Take 1 tablet (325 mg total) by mouth daily with breakfast. 90 tablet 1   furosemide (LASIX) 40 MG tablet 40 mg alternating with 20 mg every other day 90 tablet 3   Magnesium Oxide 420 MG TABS Take 420 mg by mouth daily.      memantine (NAMENDA) 10 MG tablet Take 1 tablet (10 mg) twice a day (Patient taking differently: Take 10 mg by mouth daily.) 60 tablet 11   mirtazapine (REMERON) 15 MG tablet TAKE 1 TABLET(15 MG) BY MOUTH AT BEDTIME 90 tablet 1   OXYGEN Inhale 5 L/min into the lungs continuous.     predniSONE (DELTASONE) 10 MG tablet TAKE 2 TABLETS(20 MG) BY MOUTH DAILY (Patient taking differently: Take 10 mg by mouth 2 (two) times daily with a meal.) 60 tablet 11   simvastatin (ZOCOR) 40 MG tablet Take 1 tablet (40 mg total) by mouth daily. 90 tablet 3   sulfamethoxazole-trimethoprim (BACTRIM DS) 800-160 MG tablet TAKE 1 TABLET BY MOUTH AS A SINGLE DOSE. TAKE 1 TABLET ON MONDAY, WEDNESDAY, AND FRIDAY 12 tablet 5   SYSTANE COMPLETE PF 0.6 % SOLN Place 1 drop into both eyes 4 (four) times daily.     triamcinolone cream (KENALOG) 0.1 % Apply 1 application  topically daily as needed (for dryness/itching (affected areas)).     No current facility-administered medications for this visit.    Family History  Problem Relation Age of Onset   Heart failure Mother    Heart disease Mother        CHF   Heart attack Mother    Hypertension Mother    Heart attack Father    Heart disease Father 54       MI   Hypertension Father    Heart attack Sister 69   Heart disease Sister 80       MI   Diabetes Sister    Hyperlipidemia Sister    Hypertension Sister    Varicose Veins Sister    Heart attack Brother 35   Diabetes Brother    Heart disease Brother  60       MI   Cancer Brother    Hypertension Brother    Deep vein thrombosis Daughter    Leukemia Daughter  Other Son        varicose veins   Arthritis/Rheumatoid Son    Lupus Daughter    Colon cancer Neg Hx     Social History   Socioeconomic History   Marital status: Widowed    Spouse name: Not on file   Number of children: 3   Years of education: 38   Highest education level: 12th grade  Occupational History   Occupation: RETIRED    Employer: RETIRED  Tobacco Use   Smoking status: Former    Current packs/day: 0.00    Average packs/day: 1 pack/day for 40.0 years (40.0 ttl pk-yrs)    Types: Cigarettes    Start date: 02/20/1963    Quit date: 02/20/2003    Years since quitting: 20.2    Passive exposure: Past   Smokeless tobacco: Never  Vaping Use   Vaping status: Never Used  Substance and Sexual Activity   Alcohol use: Not Currently    Alcohol/week: 1.0 standard drink of alcohol    Types: 1 Shots of liquor per week    Comment: 1 drinks a week   Drug use: No   Sexual activity: Not on file  Other Topics Concern   Not on file  Social History Narrative   Quit smoking in 1998. Retired Tax adviser. Regular exercise- no.    One story home   Right handed   3 children   Drinks caffeine   Social Drivers of Corporate investment banker Strain: Low Risk  (05/09/2023)   Overall Financial Resource Strain (CARDIA)    Difficulty of Paying Living Expenses: Not hard at all  Food Insecurity: No Food Insecurity (05/09/2023)   Hunger Vital Sign    Worried About Running Out of Food in the Last Year: Never true    Ran Out of Food in the Last Year: Never true  Transportation Needs: No Transportation Needs (05/09/2023)   PRAPARE - Administrator, Civil Service (Medical): No    Lack of Transportation (Non-Medical): No  Physical Activity: Insufficiently Active (05/09/2023)   Exercise Vital Sign    Days of Exercise per Week: 7 days    Minutes of Exercise per Session: 20 min   Stress: No Stress Concern Present (05/09/2023)   [employer] of Occupational Health - Occupational Stress Questionnaire    Feeling of Stress : Not at all  Recent Concern: Stress - Stress Concern Present (04/01/2023)   [employer] of Occupational Health - Occupational Stress Questionnaire    Feeling of Stress : Rather much  Social Connections: Socially Isolated (05/09/2023)   Social Connection and Isolation Panel [NHANES]    Frequency of Communication with Friends and Family: More than three times a week    Frequency of Social Gatherings with Friends and Family: Never    Attends Religious Services: Never    Database administrator or Organizations: No    Attends Banker Meetings: Never    Marital Status: Widowed  Intimate Partner Violence: Not At Risk (05/09/2023)   Humiliation, Afraid, Rape, and Kick questionnaire    Fear of Current or Ex-Partner: No    Emotionally Abused: No    Physically Abused: No    Sexually Abused: No     REVIEW OF SYSTEMS:  *** [X]  denotes positive finding, [ ]  denotes negative finding Cardiac  Comments:  Chest pain or chest pressure:    Shortness of breath upon exertion:    Short of breath when lying flat:  Irregular heart rhythm:        Vascular    Pain in calf, thigh, or hip brought on by ambulation:    Pain in feet at night that wakes you up from your sleep:     Blood clot in your veins:    Leg swelling:         Pulmonary    Oxygen at home:    Productive cough:     Wheezing:         Neurologic    Sudden weakness in arms or legs:     Sudden numbness in arms or legs:     Sudden onset of difficulty speaking or slurred speech:    Temporary loss of vision in one eye:     Problems with dizziness:         Gastrointestinal    Blood in stool:     Vomited blood:         Genitourinary    Burning when urinating:     Blood in urine:        Psychiatric    Major depression:         Hematologic    Bleeding problems:     Problems with blood clotting too easily:        Skin    Rashes or ulcers:        Constitutional    Fever or chills:      PHYSICAL EXAMINATION:  ***  General:  WDWN in NAD; vital signs documented above Gait: Not observed HENT: WNL, normocephalic Pulmonary: normal non-labored breathing  Cardiac: {Desc; regular/irreg:14544} HR;  {With/Without:20273} carotid bruit*** Abdomen: soft, NT; aortic pulse is *** palpable Skin: {With/Without:20273} rashes Vascular Exam/Pulses:  Right Left  Radial {Exam; arterial pulse strength 0-4:30167} {Exam; arterial pulse strength 0-4:30167}  Femoral {Exam; arterial pulse strength 0-4:30167} {Exam; arterial pulse strength 0-4:30167}  Popliteal {Exam; arterial pulse strength 0-4:30167} {Exam; arterial pulse strength 0-4:30167}  DP {Exam; arterial pulse strength 0-4:30167} {Exam; arterial pulse strength 0-4:30167}  Brian [name] {Exam; arterial pulse strength 0-4:30167} {Exam; arterial pulse strength 0-4:30167}   Extremities: {With/Without:20273} open wounds Musculoskeletal: no muscle wasting or atrophy  Neurologic: A&O X 3 Psychiatric:  The Brian [name] has {Desc; normal/abnormal:11317::"Normal"} affect.   Non-Invasive Vascular Imaging:   EVAR Arterial duplex on 05/22/2023: ***  Previous EVAR arterial duplex on 05/17/2022: Endovascular Aortic Repair (EVAR):  +----------+----------------+-------------------+-------------------+           Diameter AP (cm)Diameter Trans (cm)Velocities (cm/sec)  +----------+----------------+-------------------+-------------------+  Aorta    3.48            3.81               25                   +----------+----------------+-------------------+-------------------+  Right Limb1.42            1.48               98                   +----------+----------------+-------------------+-------------------+  Left Limb 1.38            1.37               47                    +----------+----------------+-------------------+-------------------+   Summary:  Abdominal Aorta: Patent endovascular aneurysm repair with no evidence of endoleak. Incidental finding of Celiac axis and SMA  stenosis > 70%    ASSESSMENT/PLAN:: 89 y.o. male here with hx of EVAR on  02/08/2019 by Brian [name].  -*** -continue asa/statin -Brian [name] will f/u in *** with ***.   Doreatha Massed, [facility] [phone]  Clinic MD:   Randie [name]

## 2023-05-22 ENCOUNTER — Ambulatory Visit (INDEPENDENT_AMBULATORY_CARE_PROVIDER_SITE_OTHER): Payer: Medicare Other | Admitting: Physician Assistant

## 2023-05-22 ENCOUNTER — Ambulatory Visit (HOSPITAL_COMMUNITY)
Admission: RE | Admit: 2023-05-22 | Discharge: 2023-05-22 | Disposition: A | Discharge status: Home / Self Care | Payer: Medicare Other | Source: Ambulatory Visit | Attending: Vascular Surgery | Admitting: Vascular Surgery

## 2023-05-22 ENCOUNTER — Encounter: Payer: Self-pay | Admitting: Physician Assistant

## 2023-05-22 VITALS — BP 158/93 | HR 69 | Temp 97.7°F | Ht 71.0 in | Wt 167.0 lb

## 2023-05-22 DIAGNOSIS — I714 Abdominal aortic aneurysm, without rupture, unspecified: Secondary | ICD-10-CM | POA: Diagnosis not present

## 2023-06-05 ENCOUNTER — Other Ambulatory Visit: Payer: Self-pay | Admitting: Emergency Medicine

## 2023-06-05 ENCOUNTER — Encounter: Payer: Self-pay | Admitting: Emergency Medicine

## 2023-07-22 ENCOUNTER — Telehealth: Payer: Self-pay | Admitting: Emergency Medicine

## 2023-07-22 NOTE — Telephone Encounter (Signed)
 Cmn received from [facility] for O2 concentrator.

## 2023-08-09 NOTE — Telephone Encounter (Signed)
 Rc'd signed O2 order. Will fax with office notes to [phone]. Once confirmation rc'd will send to scan.

## 2023-08-22 ENCOUNTER — Ambulatory Visit: Payer: Self-pay

## 2023-08-22 NOTE — Telephone Encounter (Signed)
 FYI Only or Action Required?: FYI only for provider.  Patient is followed in Pulmonology for COPD/nodules, last seen on 03/21/2023 by Brian Lamar RAMAN, MD. Called Nurse Triage reporting Wheezing. Symptoms began several weeks ago. Interventions attempted: Maintenance inhaler and Home oxygen  use. Symptoms are: gradually worsening.  Triage Disposition: See HCP Within 4 Hours (Or PCP Triage)  Patient/caregiver understands and will follow disposition?: Yes    Copied from CRM [id number]. Topic: Clinical - Red Word Triage >> Aug 22, 2023  8:56 AM Celestine FALCON wrote: Red Word that prompted transfer to Nurse Triage: Pt's daughter Brad stated her father is experiencing wheezing from his left lung that is causing him concern to almost having panic attacks. Pt is wanting a sooner appt if possible with Dr. Shelah or someone from his care team. Pt scheduled for 9/3 at 345 pm with Dr. Shelah. Reason for Disposition  [1] Longstanding difficulty breathing (e.g., CHF, COPD, emphysema) AND [2] WORSE than normal  Answer Assessment - Initial Assessment Questions E2C2 Pulmonary Triage - Initial Assessment Questions Chief Complaint (e.g., cough, sob, wheezing, fever, chills, sweat or additional symptoms) *Go to specific symptom protocol after initial questions. Wheezing leading to panic attacks SOB with exertion  How long have symptoms been present? X few weeks  Have you tested for COVID or Flu? Note: If not, ask patient if a home test can be taken. If so, instruct patient to call back for positive results. No  MEDICINES:   Have you used any OTC meds to help with symptoms? No If yes, ask What medications? N/a  Have you used your inhalers/maintenance medication? Yes If yes, What medications? Anoro - as prescribed every morning Albuterol  PRN - reports has not used recently Triager reviewed albuterol  usage.   If inhaler, ask How many puffs and how often? Note: Review instructions on medication in  the chart. See above  OXYGEN : Do you wear supplemental oxygen ? Yes If yes, How many liters are you supposed to use? 6L ATC  Do you monitor your oxygen  levels? No If yes, What is your reading (oxygen  level) today? Daughter reports early onset alzheimers - so inconsistent checks Daughter will check SpO2 after call as pt is sleeping. Triager gave parameters on when to escalate care. 80's on 6L with exertion the other day, reports recovers to low 90s after a few minutes  What is your usual oxygen  saturation reading?  (Note: Pulmonary O2 sats should be 90% or greater) 92-93      1. RESPIRATORY STATUS: Describe your breathing? (e.g., wheezing, shortness of breath, unable to speak, severe coughing)      See above 2. ONSET: When did this breathing problem begin?      See above 3. PATTERN Does the difficult breathing come and go, or has it been constant since it started?      Comes and go 4. SEVERITY: How bad is your breathing? (e.g., mild, moderate, severe)    - MILD: No SOB at rest, mild SOB with walking, speaks normally in sentences, can lie down, no retractions, pulse < 100.    - MODERATE: SOB at rest, SOB with minimal exertion and prefers to sit, cannot lie down flat, speaks in phrases, mild retractions, audible wheezing, pulse 100-120.    - SEVERE: Very SOB at rest, speaks in single words, struggling to breathe, sitting hunched forward, retractions, pulse > 120      Mild - SOB/wheezing with exertion 5. RECURRENT SYMPTOM: Have you had difficulty breathing before? If Yes, ask: When  was the last time? and What happened that time?      yes, flare up 6 mos ago 6. CARDIAC HISTORY: Do you have any history of heart disease? (e.g., heart attack, angina, bypass surgery, angioplasty)      CHF - Endorses taking Lasix  7. LUNG HISTORY: Do you have any history of lung disease?  (e.g., pulmonary embolus, asthma, emphysema)     COPD, nodules 8. CAUSE: What do you think  is causing the breathing problem?      unknown 9. OTHER SYMPTOMS: Do you have any other symptoms? (e.g., dizziness, runny nose, cough, chest pain, fever)     Occasional runny nose 10. O2 SATURATION MONITOR:  Do you use an oxygen  saturation monitor (pulse oximeter) at home? If Yes, ask: What is your reading (oxygen  level) today? What is your usual oxygen  saturation reading? (e.g., 95%)       See above 11. PREGNANCY: Is there any chance you are pregnant? When was your last menstrual period?       N/a 12. TRAVEL: Have you traveled out of the country in the last month? (e.g., travel history, exposures)       N/a    Triager attempted to schedule with [facility], but no access. Triager advised UC/Ed for further evaluation. Caregiver verbalized understanding and to call back with worsening symptoms.  Protocols used: Breathing Difficulty-A-AH

## 2023-08-27 ENCOUNTER — Inpatient Hospital Stay (HOSPITAL_COMMUNITY)
Admission: EM | Admit: 2023-08-27 | Discharge: 2023-09-02 | DRG: 378 | Disposition: A | Discharge status: Home / Self Care | Attending: Internal Medicine | Admitting: Internal Medicine

## 2023-08-27 ENCOUNTER — Emergency Department (HOSPITAL_COMMUNITY)

## 2023-08-27 ENCOUNTER — Other Ambulatory Visit: Payer: Self-pay

## 2023-08-27 DIAGNOSIS — K5731 Diverticulosis of large intestine without perforation or abscess with bleeding: Secondary | ICD-10-CM | POA: Diagnosis not present

## 2023-08-27 DIAGNOSIS — N400 Enlarged prostate without lower urinary tract symptoms: Secondary | ICD-10-CM | POA: Diagnosis present

## 2023-08-27 DIAGNOSIS — J90 Pleural effusion, not elsewhere classified: Secondary | ICD-10-CM | POA: Diagnosis not present

## 2023-08-27 DIAGNOSIS — E1151 Type 2 diabetes mellitus with diabetic peripheral angiopathy without gangrene: Secondary | ICD-10-CM | POA: Diagnosis present

## 2023-08-27 DIAGNOSIS — E785 Hyperlipidemia, unspecified: Secondary | ICD-10-CM | POA: Diagnosis not present

## 2023-08-27 DIAGNOSIS — K6289 Other specified diseases of anus and rectum: Secondary | ICD-10-CM | POA: Diagnosis not present

## 2023-08-27 DIAGNOSIS — D631 Anemia in chronic kidney disease: Secondary | ICD-10-CM | POA: Diagnosis not present

## 2023-08-27 DIAGNOSIS — J4489 Other specified chronic obstructive pulmonary disease: Secondary | ICD-10-CM | POA: Diagnosis not present

## 2023-08-27 DIAGNOSIS — E1165 Type 2 diabetes mellitus with hyperglycemia: Secondary | ICD-10-CM | POA: Diagnosis present

## 2023-08-27 DIAGNOSIS — Z888 Allergy status to other drugs, medicaments and biological substances status: Secondary | ICD-10-CM

## 2023-08-27 DIAGNOSIS — Z79899 Other long term (current) drug therapy: Secondary | ICD-10-CM

## 2023-08-27 DIAGNOSIS — K921 Melena: Secondary | ICD-10-CM | POA: Diagnosis not present

## 2023-08-27 DIAGNOSIS — D649 Anemia, unspecified: Secondary | ICD-10-CM | POA: Diagnosis not present

## 2023-08-27 DIAGNOSIS — Z1152 Encounter for screening for COVID-19: Secondary | ICD-10-CM | POA: Diagnosis not present

## 2023-08-27 DIAGNOSIS — Z923 Personal history of irradiation: Secondary | ICD-10-CM

## 2023-08-27 DIAGNOSIS — Z85118 Personal history of other malignant neoplasm of bronchus and lung: Secondary | ICD-10-CM

## 2023-08-27 DIAGNOSIS — J439 Emphysema, unspecified: Secondary | ICD-10-CM | POA: Diagnosis not present

## 2023-08-27 DIAGNOSIS — J929 Pleural plaque without asbestos: Secondary | ICD-10-CM | POA: Diagnosis not present

## 2023-08-27 DIAGNOSIS — I2510 Atherosclerotic heart disease of native coronary artery without angina pectoris: Secondary | ICD-10-CM | POA: Diagnosis present

## 2023-08-27 DIAGNOSIS — K5521 Angiodysplasia of colon with hemorrhage: Secondary | ICD-10-CM | POA: Diagnosis not present

## 2023-08-27 DIAGNOSIS — I10 Essential (primary) hypertension: Secondary | ICD-10-CM | POA: Diagnosis present

## 2023-08-27 DIAGNOSIS — F0283 Dementia in other diseases classified elsewhere, unspecified severity, with mood disturbance: Secondary | ICD-10-CM | POA: Diagnosis present

## 2023-08-27 DIAGNOSIS — D62 Acute posthemorrhagic anemia: Secondary | ICD-10-CM | POA: Diagnosis not present

## 2023-08-27 DIAGNOSIS — D696 Thrombocytopenia, unspecified: Secondary | ICD-10-CM | POA: Diagnosis present

## 2023-08-27 DIAGNOSIS — Z8679 Personal history of other diseases of the circulatory system: Secondary | ICD-10-CM

## 2023-08-27 DIAGNOSIS — I5032 Chronic diastolic (congestive) heart failure: Secondary | ICD-10-CM | POA: Diagnosis present

## 2023-08-27 DIAGNOSIS — K5730 Diverticulosis of large intestine without perforation or abscess without bleeding: Secondary | ICD-10-CM | POA: Diagnosis not present

## 2023-08-27 DIAGNOSIS — Z9981 Dependence on supplemental oxygen: Secondary | ICD-10-CM

## 2023-08-27 DIAGNOSIS — Z860100 Personal history of colon polyps, unspecified: Secondary | ICD-10-CM | POA: Diagnosis not present

## 2023-08-27 DIAGNOSIS — I959 Hypotension, unspecified: Secondary | ICD-10-CM | POA: Diagnosis not present

## 2023-08-27 DIAGNOSIS — I130 Hypertensive heart and chronic kidney disease with heart failure and stage 1 through stage 4 chronic kidney disease, or unspecified chronic kidney disease: Secondary | ICD-10-CM | POA: Diagnosis not present

## 2023-08-27 DIAGNOSIS — Z7952 Long term (current) use of systemic steroids: Secondary | ICD-10-CM

## 2023-08-27 DIAGNOSIS — Q439 Congenital malformation of intestine, unspecified: Secondary | ICD-10-CM | POA: Diagnosis not present

## 2023-08-27 DIAGNOSIS — F0150 Vascular dementia without behavioral disturbance: Secondary | ICD-10-CM | POA: Diagnosis not present

## 2023-08-27 DIAGNOSIS — Z87891 Personal history of nicotine dependence: Secondary | ICD-10-CM

## 2023-08-27 DIAGNOSIS — I252 Old myocardial infarction: Secondary | ICD-10-CM

## 2023-08-27 DIAGNOSIS — E1159 Type 2 diabetes mellitus with other circulatory complications: Secondary | ICD-10-CM | POA: Diagnosis present

## 2023-08-27 DIAGNOSIS — Z8249 Family history of ischemic heart disease and other diseases of the circulatory system: Secondary | ICD-10-CM

## 2023-08-27 DIAGNOSIS — R918 Other nonspecific abnormal finding of lung field: Secondary | ICD-10-CM | POA: Diagnosis not present

## 2023-08-27 DIAGNOSIS — K625 Hemorrhage of anus and rectum: Secondary | ICD-10-CM | POA: Diagnosis not present

## 2023-08-27 DIAGNOSIS — K922 Gastrointestinal hemorrhage, unspecified: Secondary | ICD-10-CM | POA: Diagnosis present

## 2023-08-27 DIAGNOSIS — D7589 Other specified diseases of blood and blood-forming organs: Secondary | ICD-10-CM | POA: Diagnosis not present

## 2023-08-27 DIAGNOSIS — R0600 Dyspnea, unspecified: Secondary | ICD-10-CM | POA: Diagnosis not present

## 2023-08-27 DIAGNOSIS — E1169 Type 2 diabetes mellitus with other specified complication: Secondary | ICD-10-CM | POA: Diagnosis not present

## 2023-08-27 DIAGNOSIS — Z7982 Long term (current) use of aspirin: Secondary | ICD-10-CM

## 2023-08-27 DIAGNOSIS — R0602 Shortness of breath: Secondary | ICD-10-CM | POA: Diagnosis not present

## 2023-08-27 DIAGNOSIS — Z83438 Family history of other disorder of lipoprotein metabolism and other lipidemia: Secondary | ICD-10-CM

## 2023-08-27 DIAGNOSIS — J449 Chronic obstructive pulmonary disease, unspecified: Secondary | ICD-10-CM | POA: Diagnosis present

## 2023-08-27 DIAGNOSIS — G309 Alzheimer's disease, unspecified: Secondary | ICD-10-CM | POA: Diagnosis present

## 2023-08-27 DIAGNOSIS — E1122 Type 2 diabetes mellitus with diabetic chronic kidney disease: Secondary | ICD-10-CM | POA: Insufficient documentation

## 2023-08-27 DIAGNOSIS — F0153 Vascular dementia, unspecified severity, with mood disturbance: Secondary | ICD-10-CM | POA: Diagnosis present

## 2023-08-27 DIAGNOSIS — N184 Chronic kidney disease, stage 4 (severe): Secondary | ICD-10-CM | POA: Diagnosis present

## 2023-08-27 DIAGNOSIS — E875 Hyperkalemia: Secondary | ICD-10-CM | POA: Diagnosis not present

## 2023-08-27 DIAGNOSIS — Z7951 Long term (current) use of inhaled steroids: Secondary | ICD-10-CM

## 2023-08-27 DIAGNOSIS — D500 Iron deficiency anemia secondary to blood loss (chronic): Secondary | ICD-10-CM | POA: Diagnosis not present

## 2023-08-27 DIAGNOSIS — Z951 Presence of aortocoronary bypass graft: Secondary | ICD-10-CM

## 2023-08-27 DIAGNOSIS — Z91040 Latex allergy status: Secondary | ICD-10-CM

## 2023-08-27 DIAGNOSIS — Z806 Family history of leukemia: Secondary | ICD-10-CM

## 2023-08-27 DIAGNOSIS — Z833 Family history of diabetes mellitus: Secondary | ICD-10-CM

## 2023-08-27 DIAGNOSIS — L729 Follicular cyst of the skin and subcutaneous tissue, unspecified: Secondary | ICD-10-CM | POA: Diagnosis not present

## 2023-08-27 LAB — COMPREHENSIVE METABOLIC PANEL WITH GFR
ALT: 15 U/L (ref 0–44)
AST: 16 U/L (ref 15–41)
Albumin: 3.2 g/dL — ABNORMAL LOW (ref 3.5–5.0)
Alkaline Phosphatase: 43 U/L (ref 38–126)
Anion gap: 13 (ref 5–15)
BUN: 59 mg/dL — ABNORMAL HIGH (ref 8–23)
CO2: 27 mmol/L (ref 22–32)
Calcium: 8.5 mg/dL — ABNORMAL LOW (ref 8.9–10.3)
Chloride: 102 mmol/L (ref 98–111)
Creatinine, Ser: 2.68 mg/dL — ABNORMAL HIGH (ref 0.61–1.24)
GFR, Estimated: 22 mL/min — ABNORMAL LOW (ref >60)
Glucose, Bld: 469 mg/dL — ABNORMAL HIGH (ref 70–99)
Potassium: 5.6 mmol/L — ABNORMAL HIGH (ref 3.5–5.1)
Sodium: 142 mmol/L (ref 135–145)
Total Bilirubin: 0.6 mg/dL (ref 0.0–1.2)
Total Protein: 5.3 g/dL — ABNORMAL LOW (ref 6.5–8.1)

## 2023-08-27 LAB — CBC WITH DIFFERENTIAL/PLATELET
Abs Immature Granulocytes: 0.05 K/uL (ref 0.00–0.07)
Basophils Absolute: 0 K/uL (ref 0.0–0.1)
Basophils Relative: 0 %
Eosinophils Absolute: 0 K/uL (ref 0.0–0.5)
Eosinophils Relative: 0 %
HCT: 23.4 % — ABNORMAL LOW (ref 39.0–52.0)
Hemoglobin: 6.9 g/dL — CL (ref 13.0–17.0)
Immature Granulocytes: 1 %
Lymphocytes Relative: 4 %
Lymphs Abs: 0.3 K/uL — ABNORMAL LOW (ref 0.7–4.0)
MCH: 32.4 pg (ref 26.0–34.0)
MCHC: 29.5 g/dL — ABNORMAL LOW (ref 30.0–36.0)
MCV: 109.9 fL — ABNORMAL HIGH (ref 80.0–100.0)
Monocytes Absolute: 0.6 K/uL (ref 0.1–1.0)
Monocytes Relative: 8 %
Neutro Abs: 6.9 K/uL (ref 1.7–7.7)
Neutrophils Relative %: 87 %
Platelets: 151 K/uL (ref 150–400)
RBC: 2.13 MIL/uL — ABNORMAL LOW (ref 4.22–5.81)
RDW: 14.2 % (ref 11.5–15.5)
WBC: 7.9 K/uL (ref 4.0–10.5)
nRBC: 0.5 % — ABNORMAL HIGH (ref 0.0–0.2)

## 2023-08-27 LAB — PREPARE RBC (CROSSMATCH)

## 2023-08-27 LAB — LIPASE, BLOOD: Lipase: 46 U/L (ref 11–51)

## 2023-08-27 LAB — MAGNESIUM: Magnesium: 2 mg/dL (ref 1.7–2.4)

## 2023-08-27 MED ORDER — IPRATROPIUM-ALBUTEROL 0.5-2.5 (3) MG/3ML IN SOLN
3.0000 mL | Freq: Once | RESPIRATORY_TRACT | Status: AC
  Administered 2023-08-27: 3 mL via RESPIRATORY_TRACT
  Filled 2023-08-27: qty 3

## 2023-08-27 MED ORDER — SODIUM ZIRCONIUM CYCLOSILICATE 10 G PO PACK
10.0000 g | PACK | Freq: Once | ORAL | Status: AC
  Administered 2023-08-27: 10 g via ORAL
  Filled 2023-08-27: qty 1

## 2023-08-27 MED ORDER — PANTOPRAZOLE SODIUM 40 MG IV SOLR
40.0000 mg | Freq: Two times a day (BID) | INTRAVENOUS | Status: DC
  Administered 2023-08-27 – 2023-09-02 (×12): 40 mg via INTRAVENOUS
  Filled 2023-08-27 (×11): qty 10

## 2023-08-27 MED ORDER — SODIUM CHLORIDE 0.9% IV SOLUTION: Freq: Once | INTRAVENOUS | Status: AC

## 2023-08-27 MED ORDER — INSULIN ASPART 100 UNIT/ML IJ SOLN
8.0000 [IU] | Freq: Once | INTRAMUSCULAR | Status: AC
  Administered 2023-08-27: 8 [IU] via INTRAVENOUS

## 2023-08-27 NOTE — ED Provider Notes (Signed)
 11:46 PM Assumed care from Dr. Melvenia, please see their note for full history, physical and decision making until this point. In brief this is a 89 y.o. year old male who presented to the ED tonight with Rectal Bleeding     GIB w/ symptomatic anemia - dyspnea. Checking some studies to ensure no other cause. GI aware. Pending results and admit.   Patient was found to have an elevated BNP and small pleural effusions so Lasix  given while getting his blood.  Discussed with hospitalist for admission.  CRITICAL CARE Performed by: Selinda Sias Total critical care time: 35 minutes Critical care time was exclusive of separately billable procedures and treating other patients. Critical care was necessary to treat or prevent imminent or life-threatening deterioration. Critical care was time spent personally by me on the following activities: development of treatment plan with patient and/or surrogate as well as nursing, discussions with consultants, evaluation of patient's response to treatment, examination of patient, obtaining history from patient or surrogate, ordering and performing treatments and interventions, ordering and review of laboratory studies, ordering and review of radiographic studies, pulse oximetry and re-evaluation of patient's condition.   Labs, studies and imaging reviewed by myself and considered in medical decision making if ordered. Imaging interpreted by radiology.  Labs Reviewed  COMPREHENSIVE METABOLIC PANEL WITH GFR - Abnormal; Notable for the following components:      Result Value   Potassium 5.6 (*)    Glucose, Bld 469 (*)    BUN 59 (*)    Creatinine, Ser 2.68 (*)    Calcium 8.5 (*)    Total Protein 5.3 (*)    Albumin 3.2 (*)    GFR, Estimated 22 (*)    All other components within normal limits  CBC WITH DIFFERENTIAL/PLATELET - Abnormal; Notable for the following components:   RBC 2.13 (*)    Hemoglobin 6.9 (*)    HCT 23.4 (*)    MCV 109.9 (*)    MCHC 29.5 (*)     nRBC 0.5 (*)    Lymphs Abs 0.3 (*)    All other components within normal limits  LIPASE, BLOOD  MAGNESIUM   URINALYSIS, ROUTINE W REFLEX MICROSCOPIC  BRAIN NATRIURETIC PEPTIDE  TYPE AND SCREEN  PREPARE RBC (CROSSMATCH)    DG Chest Portable 1 View  Final Result    CT CHEST ABDOMEN PELVIS WO CONTRAST    (Results Pending)    No follow-ups on file.    Sias Selinda, MD 08/28/23 2330

## 2023-08-27 NOTE — ED Provider Notes (Signed)
 Brian [name] Provider Note   CSN: [id number] Arrival date & time: 08/27/23  2143     Patient presents with: Rectal Bleeding   Brian [name] is a 89 y.o. male.   HPI Patient presents for hematochezia.  Medical history includes HTN, DM, HLD, gout, CAD, AAA, PAD, COPD, CKD, anxiety, dementia, BPH.  He is not prescribed a blood thinner.  A month ago, he had black stools.  He was seen by his PCP and they checked his hemoglobin.  Per EMS report, it was low but similar to baseline.  He had resolution of his black stools for the last several weeks.  Recurrence occurred last night.  Today, stools have had presence of both dark and bright red blood.  This was witnessed by EMS.  Patient endorses some increased exertional shortness of breath.  He denies any other recent symptoms, including abdominal pain or rectal pain.  At baseline, he reportedly wears 6 L of oxygen     Prior to Admission medications   Medication Sig Start Date End Date Taking? Authorizing Provider  ACCU-CHEK COMPACT STRIPS test strip TEST BLOOD SUGAR ONCE DAILY 08/18/11   Tish Elsie FALCON, MD  albuterol  (VENTOLIN  HFA) 108 [phone] Base) MCG/ACT inhaler Inhale 2 puffs into the lungs every 6 (six) hours as needed for wheezing or shortness of breath. 01/15/22   Geofm Glade PARAS, MD  ANORO ELLIPTA  62.5-25 MCG/ACT AEPB USE 1 INHALATION DAILY 07/11/22   Geofm Glade PARAS, MD  Aspirin  81 MG EC tablet Take 81 mg by mouth daily.    [provider]  Cholecalciferol  (VITAMIN D3) 50 MCG (2000 UT) TABS Take 2,000 Units by mouth daily.    [provider]  Cyanocobalamin  (VITAMIN B12 PO) Take 1 tablet by mouth daily.    [provider]  escitalopram  (LEXAPRO ) 10 MG tablet Take 20 mg by mouth. Pt take 15mg  04/30/22   [provider]  ferrous sulfate  325 (65 FE) MG tablet Take 1 tablet (325 mg total) by mouth daily with breakfast. 09/28/22   Geofm Glade PARAS, MD  furosemide  (LASIX ) 40  MG tablet 40 mg alternating with 20 mg every other day 05/03/23   Verlin Lonni BIRCH, MD  Magnesium  Oxide 420 MG TABS Take 420 mg by mouth daily.     [provider]  memantine  (NAMENDA ) 10 MG tablet Take 1 tablet (10 mg) twice a day Patient taking differently: Take 10 mg by mouth daily. 01/25/22   Wertman, Sara E, PA-C  mirtazapine  (REMERON ) 15 MG tablet TAKE 1 TABLET(15 MG) BY MOUTH AT BEDTIME 01/21/23   Geofm Glade PARAS, MD  OXYGEN  Inhale 5 L/min into the lungs continuous.    [provider]  predniSONE  (DELTASONE ) 10 MG tablet TAKE 2 TABLETS(20 MG) BY MOUTH DAILY Patient taking differently: Take 10 mg by mouth 2 (two) times daily with a meal. 08/16/22   Byrum, Lamar RAMAN, MD  simvastatin  (ZOCOR ) 40 MG tablet Take 1 tablet (40 mg total) by mouth daily. 02/27/23   Burns, Glade PARAS, MD  sulfamethoxazole -trimethoprim  (BACTRIM  DS) 800-160 MG tablet TAKE 1 TABLET BY MOUTH AS A SINGLE DOSE. TAKE 1 TABLET ON MONDAY, WEDNESDAY, AND FRIDAY 06/05/23   Byrum, Lamar RAMAN, MD  SYSTANE COMPLETE PF 0.6 % SOLN Place 1 drop into both eyes 4 (four) times daily.    [provider]  triamcinolone  cream (KENALOG ) 0.1 % Apply 1 application  topically daily as needed (for dryness/itching (affected areas)).  [provider]    Allergies: Meperidine hcl, Sertraline , Gabapentin , Memantine , Latex, and Zolpidem tartrate    Review of Systems  Respiratory:  Positive for shortness of breath.   Gastrointestinal:  Positive for blood in stool.  All other systems reviewed and are negative.   Updated Vital Signs Ht 5' 11 (1.803 m)   Wt 76 kg   BMI 23.37 kg/m   Physical Exam Vitals and nursing note reviewed.  Constitutional:      General: He is not in acute distress.    Appearance: Normal appearance. He is well-developed. He is not ill-appearing, toxic-appearing or diaphoretic.  HENT:     Head: Normocephalic and atraumatic.     Right Ear: External ear normal.     Left Ear: External ear  normal.     Nose: Nose normal.     Mouth/Throat:     Mouth: Mucous membranes are moist.  Eyes:     Extraocular Movements: Extraocular movements intact.     Conjunctiva/sclera: Conjunctivae normal.  Cardiovascular:     Rate and Rhythm: Normal rate and regular rhythm.  Pulmonary:     Effort: Pulmonary effort is normal. No respiratory distress.  Abdominal:     General: There is no distension.     Palpations: Abdomen is soft.     Tenderness: There is no abdominal tenderness.  Musculoskeletal:        General: No swelling.     Cervical back: Normal range of motion and neck supple.  Skin:    General: Skin is warm and dry.     Coloration: Skin is not jaundiced or pale.  Neurological:     General: No focal deficit present.     Mental Status: He is alert and oriented to person, place, and time.  Psychiatric:        Mood and Affect: Mood normal.        Behavior: Behavior normal.     (all labs ordered are listed, but only abnormal results are displayed) Labs Reviewed  CBC WITH DIFFERENTIAL/PLATELET - Abnormal; Notable for the following components:      Result Value   RBC 2.13 (*)    Hemoglobin 6.9 (*)    HCT 23.4 (*)    MCV 109.9 (*)    MCHC 29.5 (*)    nRBC 0.5 (*)    Lymphs Abs 0.3 (*)    All other components within normal limits  COMPREHENSIVE METABOLIC PANEL WITH GFR  LIPASE, BLOOD  URINALYSIS, ROUTINE W REFLEX MICROSCOPIC  MAGNESIUM   BRAIN NATRIURETIC PEPTIDE  TYPE AND SCREEN  PREPARE RBC (CROSSMATCH)    EKG: None  Radiology: No results found.   Procedures   Medications Ordered in the ED  pantoprazole  (PROTONIX ) injection 40 mg (has no administration in time range)  ipratropium-albuterol  (DUONEB) 0.5-2.5 (3) MG/3ML nebulizer solution 3 mL (has no administration in time range)  0.9 %  sodium chloride  infusion (Manually program via Guardrails IV Fluids) (has no administration in time range)                                    Medical Decision Making Amount  and/or Complexity of Data Reviewed Labs: ordered. Radiology: ordered.  Risk Prescription drug management.   This patient presents to the ED for concern of blood in stool, this involves an extensive number of treatment options, and is a complaint that carries with it a high risk of complications  and morbidity.  The differential diagnosis includes hemorrhoidal bleed, diverticular bleed, colitis, AVM, upper GI bleed with rapid transit, blood loss anemia   Co morbidities / Chronic conditions that complicate the patient evaluation  HTN, DM, HLD, gout, CAD, AAA, PAD, COPD, CKD, anxiety, dementia, BPH   Additional history obtained:  Additional history obtained from EMR External records from outside source obtained and reviewed including EMS   Lab Tests:  I Ordered, and personally interpreted labs.  The pertinent results include: Acute on chronic anemia with hemoglobin today of 6.9.  Baseline appears to be 9-10.  Remaining lab work pending at time of signout.   Imaging Studies ordered:  I ordered imaging studies including chest x-ray I independently visualized and interpreted imaging which showed (pending at time of signout) I agree with the radiologist interpretation   Problem List / ED Course / Critical interventions / Medication management  Patient presenting for GI bleed.  Onset of dark stools was last night.  Today, he had several episodes of stools that contain both dark and bright red blood.  This was confirmed by EMS on scene.  Patient has had some recent exertional shortness of breath.  On arrival in the ED, he is resting comfortably.  He denies any current or recent areas of discomfort.  Abdomen is soft and nontender.  Workup was initiated.  Per chart, he last underwent colonoscopy 1.5 years ago.  At the time, he had 2 nonbleeding colonic angiodysplastic lesions.  These were treated with APC.  Diverticulosis was noted.  He is followed by [facility].  His baseline hemoglobin  appears to be 9-10.  Hemoglobin today is 6.9.  Patient was consented for blood transfusion.  1 unit PRBCs was ordered.  He is on Lasix  daily.  Additional workup was ordered to assess for volume status.  He may need dose of Lasix  with his transfusion.  Remaining lab work and imaging pending at time of signout.  Care of patient was signed out to oncoming ED provider I ordered medication including Protonix  for GI bleed; peer BCs for symptomatic anemia; DuoNeb for shortness of breath Reevaluation of the patient after these medicines showed that the patient improved I have reviewed the patients home medicines and have made adjustments as needed   Consultations Obtained:  I requested consultation with the gastroenterologist, Dr. Charlanne,  and discussed lab and imaging findings as well as pertinent plan - they recommend: Blood transfusion, PPI, GI team will see in the morning   Social Determinants of Health:  Lives at home with family  CRITICAL CARE Performed by: Bernardino Fireman   Total critical care time: 31 minutes  Critical care time was exclusive of separately billable procedures and treating other patients.  Critical care was necessary to treat or prevent imminent or life-threatening deterioration.  Critical care was time spent personally by me on the following activities: development of treatment plan with patient and/or surrogate as well as nursing, discussions with consultants, evaluation of patient's response to treatment, examination of patient, obtaining history from patient or surrogate, ordering and performing treatments and interventions, ordering and review of laboratory studies, ordering and review of radiographic studies, pulse oximetry and re-evaluation of patient's condition.      Final diagnoses:  Hematochezia  Symptomatic anemia    ED Discharge Orders     None          Fireman Bernardino, MD 08/27/23 2324

## 2023-08-27 NOTE — ED Triage Notes (Signed)
 Pt BIB [facility] from home. Family reports pt having multiple BM with bright red blood. Pt also reports increased SOB when up and ambulating. Denies CP

## 2023-08-28 ENCOUNTER — Encounter (HOSPITAL_COMMUNITY): Payer: Self-pay | Admitting: Internal Medicine

## 2023-08-28 ENCOUNTER — Emergency Department (HOSPITAL_COMMUNITY)

## 2023-08-28 ENCOUNTER — Observation Stay (HOSPITAL_BASED_OUTPATIENT_CLINIC_OR_DEPARTMENT_OTHER)

## 2023-08-28 DIAGNOSIS — K625 Hemorrhage of anus and rectum: Secondary | ICD-10-CM | POA: Diagnosis not present

## 2023-08-28 DIAGNOSIS — K5521 Angiodysplasia of colon with hemorrhage: Secondary | ICD-10-CM | POA: Diagnosis not present

## 2023-08-28 DIAGNOSIS — J929 Pleural plaque without asbestos: Secondary | ICD-10-CM | POA: Diagnosis not present

## 2023-08-28 DIAGNOSIS — I10 Essential (primary) hypertension: Secondary | ICD-10-CM | POA: Diagnosis not present

## 2023-08-28 DIAGNOSIS — D500 Iron deficiency anemia secondary to blood loss (chronic): Secondary | ICD-10-CM

## 2023-08-28 DIAGNOSIS — K6289 Other specified diseases of anus and rectum: Secondary | ICD-10-CM | POA: Diagnosis not present

## 2023-08-28 DIAGNOSIS — K5730 Diverticulosis of large intestine without perforation or abscess without bleeding: Secondary | ICD-10-CM | POA: Diagnosis not present

## 2023-08-28 DIAGNOSIS — I2510 Atherosclerotic heart disease of native coronary artery without angina pectoris: Secondary | ICD-10-CM | POA: Diagnosis not present

## 2023-08-28 DIAGNOSIS — E1169 Type 2 diabetes mellitus with other specified complication: Secondary | ICD-10-CM

## 2023-08-28 DIAGNOSIS — K922 Gastrointestinal hemorrhage, unspecified: Secondary | ICD-10-CM | POA: Diagnosis not present

## 2023-08-28 DIAGNOSIS — E785 Hyperlipidemia, unspecified: Secondary | ICD-10-CM

## 2023-08-28 DIAGNOSIS — J439 Emphysema, unspecified: Secondary | ICD-10-CM

## 2023-08-28 DIAGNOSIS — Q439 Congenital malformation of intestine, unspecified: Secondary | ICD-10-CM | POA: Diagnosis not present

## 2023-08-28 DIAGNOSIS — F0150 Vascular dementia without behavioral disturbance: Secondary | ICD-10-CM

## 2023-08-28 DIAGNOSIS — I5032 Chronic diastolic (congestive) heart failure: Secondary | ICD-10-CM

## 2023-08-28 DIAGNOSIS — N184 Chronic kidney disease, stage 4 (severe): Secondary | ICD-10-CM | POA: Diagnosis not present

## 2023-08-28 DIAGNOSIS — D649 Anemia, unspecified: Secondary | ICD-10-CM | POA: Diagnosis not present

## 2023-08-28 DIAGNOSIS — L729 Follicular cyst of the skin and subcutaneous tissue, unspecified: Secondary | ICD-10-CM | POA: Diagnosis not present

## 2023-08-28 LAB — MAGNESIUM: Magnesium: 2.2 mg/dL (ref 1.7–2.4)

## 2023-08-28 LAB — URINALYSIS, ROUTINE W REFLEX MICROSCOPIC
Bilirubin Urine: NEGATIVE
Glucose, UA: 50 mg/dL — AB
Hgb urine dipstick: NEGATIVE
Ketones, ur: NEGATIVE mg/dL
Leukocytes,Ua: NEGATIVE
Nitrite: NEGATIVE
Protein, ur: NEGATIVE mg/dL
Specific Gravity, Urine: 1.008 (ref 1.005–1.030)
pH: 5 (ref 5.0–8.0)

## 2023-08-28 LAB — IRON AND TIBC
Iron: 179 ug/dL (ref 45–182)
Saturation Ratios: 50 % — ABNORMAL HIGH (ref 17.9–39.5)
TIBC: 356 ug/dL (ref 250–450)
UIBC: 177 ug/dL

## 2023-08-28 LAB — CBC
HCT: 26.7 % — ABNORMAL LOW (ref 39.0–52.0)
Hemoglobin: 8.1 g/dL — ABNORMAL LOW (ref 13.0–17.0)
MCH: 31.5 pg (ref 26.0–34.0)
MCHC: 30.3 g/dL (ref 30.0–36.0)
MCV: 103.9 fL — ABNORMAL HIGH (ref 80.0–100.0)
Platelets: 157 K/uL (ref 150–400)
RBC: 2.57 MIL/uL — ABNORMAL LOW (ref 4.22–5.81)
RDW: 15.4 % (ref 11.5–15.5)
WBC: 9 K/uL (ref 4.0–10.5)
nRBC: 1 % — ABNORMAL HIGH (ref 0.0–0.2)

## 2023-08-28 LAB — ECHOCARDIOGRAM COMPLETE
AR max vel: 1.45 cm2
AV Area VTI: 1.32 cm2
AV Area mean vel: 1.35 cm2
AV Mean grad: 10.5 mmHg
AV Peak grad: 20.8 mmHg
Ao pk vel: 2.28 m/s
Area-P 1/2: 2.46 cm2
Calc EF: 63.6 %
Height: 71 in
MV VTI: 2.09 cm2
S' Lateral: 3.2 cm
Single Plane A2C EF: 66.9 %
Single Plane A4C EF: 58.5 %
Weight: 2680.79 [oz_av]

## 2023-08-28 LAB — HEMOGLOBIN A1C
Hgb A1c MFr Bld: 7.1 % — ABNORMAL HIGH (ref 4.8–5.6)
Mean Plasma Glucose: 157 mg/dL

## 2023-08-28 LAB — BASIC METABOLIC PANEL WITH GFR
Anion gap: 10 (ref 5–15)
BUN: 58 mg/dL — ABNORMAL HIGH (ref 8–23)
CO2: 29 mmol/L (ref 22–32)
Calcium: 8.5 mg/dL — ABNORMAL LOW (ref 8.9–10.3)
Chloride: 103 mmol/L (ref 98–111)
Creatinine, Ser: 2.55 mg/dL — ABNORMAL HIGH (ref 0.61–1.24)
GFR, Estimated: 23 mL/min — ABNORMAL LOW (ref >60)
Glucose, Bld: 147 mg/dL — ABNORMAL HIGH (ref 70–99)
Potassium: 4.8 mmol/L (ref 3.5–5.1)
Sodium: 142 mmol/L (ref 135–145)

## 2023-08-28 LAB — TRANSFERRIN: Transferrin: 254 mg/dL (ref 180–329)

## 2023-08-28 LAB — BRAIN NATRIURETIC PEPTIDE: B Natriuretic Peptide: 576 pg/mL — ABNORMAL HIGH (ref 0.0–100.0)

## 2023-08-28 LAB — FOLATE: Folate: 20.7 ng/mL (ref >5.9)

## 2023-08-28 LAB — PHOSPHORUS: Phosphorus: 5.3 mg/dL — ABNORMAL HIGH (ref 2.5–4.6)

## 2023-08-28 LAB — GLUCOSE, CAPILLARY
Glucose-Capillary: 144 mg/dL — ABNORMAL HIGH (ref 70–99)
Glucose-Capillary: 147 mg/dL — ABNORMAL HIGH (ref 70–99)
Glucose-Capillary: 295 mg/dL — ABNORMAL HIGH (ref 70–99)
Glucose-Capillary: 187 mg/dL — ABNORMAL HIGH (ref 70–99)

## 2023-08-28 LAB — RETICULOCYTES
Immature Retic Fract: 28.3 % — ABNORMAL HIGH (ref 2.3–15.9)
RBC.: 2.56 MIL/uL — ABNORMAL LOW (ref 4.22–5.81)
Retic Count, Absolute: 113.9 K/uL (ref 19.0–186.0)
Retic Ct Pct: 4.5 % — ABNORMAL HIGH (ref 0.4–3.1)

## 2023-08-28 LAB — FERRITIN: Ferritin: 26 ng/mL (ref 24–336)

## 2023-08-28 LAB — CBG MONITORING, ED: Glucose-Capillary: 139 mg/dL — ABNORMAL HIGH (ref 70–99)

## 2023-08-28 LAB — VITAMIN B12: Vitamin B-12: 1087 pg/mL — ABNORMAL HIGH (ref 180–914)

## 2023-08-28 MED ORDER — ESCITALOPRAM OXALATE 10 MG PO TABS
20.0000 mg | ORAL_TABLET | Freq: Every day | ORAL | Status: DC
  Administered 2023-08-28 – 2023-09-02 (×6): 20 mg via ORAL
  Filled 2023-08-28 (×6): qty 2

## 2023-08-28 MED ORDER — SULFAMETHOXAZOLE-TRIMETHOPRIM 800-160 MG PO TABS
1.0000 | ORAL_TABLET | ORAL | Status: DC
  Administered 2023-08-28 – 2023-09-02 (×3): 1 via ORAL
  Filled 2023-08-28 (×3): qty 1

## 2023-08-28 MED ORDER — FUROSEMIDE 10 MG/ML IJ SOLN
40.0000 mg | Freq: Once | INTRAMUSCULAR | Status: AC
  Administered 2023-08-28: 40 mg via INTRAVENOUS
  Filled 2023-08-28: qty 4

## 2023-08-28 MED ORDER — SODIUM CHLORIDE 0.9% FLUSH
3.0000 mL | Freq: Two times a day (BID) | INTRAVENOUS | Status: DC
  Administered 2023-08-28 – 2023-09-02 (×10): 3 mL via INTRAVENOUS

## 2023-08-28 MED ORDER — SIMVASTATIN 20 MG PO TABS
40.0000 mg | ORAL_TABLET | Freq: Every day | ORAL | Status: DC
  Administered 2023-08-28 – 2023-09-01 (×5): 40 mg via ORAL
  Filled 2023-08-28 (×5): qty 2

## 2023-08-28 MED ORDER — MIRTAZAPINE 15 MG PO TABS
15.0000 mg | ORAL_TABLET | Freq: Every day | ORAL | Status: DC
  Administered 2023-08-28 – 2023-09-01 (×5): 15 mg via ORAL
  Filled 2023-08-28 (×5): qty 1

## 2023-08-28 MED ORDER — VITAMIN B-12 1000 MCG PO TABS
1000.0000 ug | ORAL_TABLET | Freq: Every day | ORAL | Status: DC
  Administered 2023-08-28 – 2023-09-02 (×6): 1000 ug via ORAL
  Filled 2023-08-28 (×6): qty 1

## 2023-08-28 MED ORDER — PREDNISONE 10 MG PO TABS
10.0000 mg | ORAL_TABLET | Freq: Two times a day (BID) | ORAL | Status: DC
  Administered 2023-08-28 – 2023-09-02 (×10): 10 mg via ORAL
  Filled 2023-08-28 (×10): qty 1

## 2023-08-28 MED ORDER — MAGNESIUM OXIDE -MG SUPPLEMENT 400 (240 MG) MG PO TABS
400.0000 mg | ORAL_TABLET | Freq: Every day | ORAL | Status: DC
  Administered 2023-08-28 – 2023-09-01 (×5): 400 mg via ORAL
  Filled 2023-08-28 (×5): qty 1

## 2023-08-28 MED ORDER — IPRATROPIUM-ALBUTEROL 0.5-2.5 (3) MG/3ML IN SOLN: 3.0000 mL | Freq: Four times a day (QID) | RESPIRATORY_TRACT | Status: DC | PRN

## 2023-08-28 MED ORDER — INSULIN ASPART 100 UNIT/ML IJ SOLN
0.0000 [IU] | Freq: Three times a day (TID) | INTRAMUSCULAR | Status: DC
  Administered 2023-08-28: 3 [IU] via SUBCUTANEOUS
  Administered 2023-08-29: 2 [IU] via SUBCUTANEOUS
  Administered 2023-09-01: 4 [IU] via SUBCUTANEOUS
  Administered 2023-09-01: 2 [IU] via SUBCUTANEOUS
  Administered 2023-09-02: 4 [IU] via SUBCUTANEOUS
  Administered 2023-09-02: 1 [IU] via SUBCUTANEOUS

## 2023-08-28 MED ORDER — UMECLIDINIUM-VILANTEROL 62.5-25 MCG/ACT IN AEPB
1.0000 | INHALATION_SPRAY | Freq: Every day | RESPIRATORY_TRACT | Status: DC
  Administered 2023-08-28 – 2023-09-02 (×6): 1 via RESPIRATORY_TRACT
  Filled 2023-08-28: qty 14

## 2023-08-28 MED ORDER — MEMANTINE HCL 10 MG PO TABS
5.0000 mg | ORAL_TABLET | Freq: Two times a day (BID) | ORAL | Status: DC
  Administered 2023-08-28 – 2023-09-02 (×12): 5 mg via ORAL
  Filled 2023-08-28 (×12): qty 1

## 2023-08-28 MED ORDER — FERROUS SULFATE 325 (65 FE) MG PO TABS: 325.0000 mg | ORAL_TABLET | Freq: Every day | ORAL | Status: DC

## 2023-08-28 MED ORDER — FUROSEMIDE 40 MG PO TABS
40.0000 mg | ORAL_TABLET | Freq: Every day | ORAL | Status: DC
  Administered 2023-08-28 – 2023-09-02 (×6): 40 mg via ORAL
  Filled 2023-08-28 (×6): qty 1

## 2023-08-28 MED ORDER — ASPIRIN 81 MG PO TBEC: 81.0000 mg | DELAYED_RELEASE_TABLET | Freq: Every day | ORAL | Status: DC

## 2023-08-28 MED ORDER — INSULIN ASPART 100 UNIT/ML IJ SOLN
0.0000 [IU] | Freq: Every day | INTRAMUSCULAR | Status: DC
  Administered 2023-08-30: 3 [IU] via SUBCUTANEOUS
  Administered 2023-09-01: 2 [IU] via SUBCUTANEOUS

## 2023-08-28 NOTE — H&P (Signed)
 History and Physical    KAMAU WEATHERALL FMW:[id number] DOB: Dec 24, 1931 DOA: 08/27/2023  PCP: Geofm Glade PARAS, MD   Patient coming from: Home   Chief Complaint:  Chief Complaint  Patient presents with   Rectal Bleeding    HPI:  Brian [name] is a 89 y.o. male with hx of CAD, CABG, COPD, CHRF on 6 L O2, CKD4, diabetes type 2, hypertension, hyperlipidemia, alzheimers / vascular dementia, aortic aneurysm, hx of GI bleeding with prior Cecal AVM treated with APC in 2/'24; EGD at that time was normal, who presents with mixed dark / bright red blood in stools. Onset few days ago. He actually brings up that he gets pain in the RLQ associated with episodes of bleeding. No upper abd pain. Takes aspirin  for hx of CAD. Otherwise reports that Sob recently worsened. No chest pain, orthopnea, LE edema    Review of Systems:  ROS complete and negative except as marked above   Allergies  Allergen Reactions   Meperidine Hcl Swelling and Other (See Comments)    Tongue swelling Because of a history of documented adverse serious drug reaction, Medi Alert bracelet  is recommended.   Sertraline  Shortness Of Breath and Other (See Comments)    Burning sensation from Head to toe, diff breathing   Gabapentin  Other (See Comments)    Confusion    Memantine  Other (See Comments)    Can take only up to 5 mg a day- causes lethargy past that amount   Latex Rash and Other (See Comments)    Minor rash   Zolpidem Tartrate Other (See Comments)    Nightmares    Prior to Admission medications   Medication Sig Start Date End Date Taking? Authorizing Provider  albuterol  (VENTOLIN  HFA) 108 (90 Base) MCG/ACT inhaler Inhale 2 puffs into the lungs every 6 (six) hours as needed for wheezing or shortness of breath. 01/15/22  Yes Geofm Glade PARAS, MD  ANORO ELLIPTA  62.5-25 MCG/ACT AEPB USE 1 INHALATION DAILY 07/11/22  Yes Burns, Glade PARAS, MD  Aspirin  81 MG EC tablet Take 81 mg by mouth daily.   Yes [provider]   Carboxymethylcellulose Sodium (THERATEARS) 0.25 % SOLN Place 1-2 drops into both eyes 4 (four) times daily as needed (dry eyes.).   Yes [provider]  Cholecalciferol  (VITAMIN D3) 50 MCG (2000 UT) capsule Take 2,000 Units by mouth daily.   Yes [provider]  Cyanocobalamin  1000 MCG CAPS Take 1,000 mcg by mouth daily.   Yes [provider]  escitalopram  (LEXAPRO ) 20 MG tablet Take 20 mg by mouth daily. Take one tablet (20mg ) by mouth daily. 04/30/22  Yes [provider]  ferrous sulfate  325 (65 FE) MG tablet Take 1 tablet (325 mg total) by mouth daily with breakfast. 09/28/22  Yes Burns, Glade PARAS, MD  furosemide  (LASIX ) 40 MG tablet 40 mg alternating with 20 mg every other day Patient taking differently: Take 20-40 mg by mouth daily. Take one-half (20mg ) tablet or one (40mg ) tablet by mouth on alternating day. 05/03/23  Yes Verlin Lonni BIRCH, MD  Homeopathic Products [facility] MUSCLE CRAMPS) FOAM Apply 2 Pump topically daily as needed (Lower leg or foot muscle cramps). Apply two pumps of foam to calf or top of foot daily as needed for muscle cramps   Yes [provider]  Magnesium  Oxide 420 MG TABS Take 420 mg by mouth at bedtime.   Yes [provider]  memantine  (NAMENDA ) 10 MG tablet Take 1 tablet (10 mg)  twice a day Patient taking differently: Take 5 mg by mouth 2 (two) times daily. Take one-half tablet (5mg ) by mouth twice daily. 01/25/22  Yes Wertman, Sara E, PA-C  mirtazapine  (REMERON ) 15 MG tablet TAKE 1 TABLET(15 MG) BY MOUTH AT BEDTIME 01/21/23  Yes Burns, Glade PARAS, MD  OXYGEN  Inhale 6 L/min into the lungs continuous.   Yes [provider]  predniSONE  (DELTASONE ) 10 MG tablet TAKE 2 TABLETS(20 MG) BY MOUTH DAILY Patient taking differently: Take 10 mg by mouth 2 (two) times daily with a meal. 08/16/22  Yes Byrum, Lamar RAMAN, MD  simvastatin  (ZOCOR ) 40 MG tablet Take 1 tablet (40 mg total) by mouth daily. 02/27/23  Yes Burns, Glade PARAS, MD  sulfamethoxazole -trimethoprim  (BACTRIM  DS) 800-160 MG tablet TAKE 1 TABLET BY MOUTH AS A SINGLE DOSE. TAKE 1 TABLET ON MONDAY, WEDNESDAY, AND FRIDAY 06/05/23  Yes Byrum, Lamar RAMAN, MD  SYSTANE COMPLETE PF 0.6 % SOLN Place 1 drop into both eyes 4 (four) times daily.   Yes [provider]  triamcinolone  cream (KENALOG ) 0.1 % Apply 1 application  topically daily as needed (for dryness/itching (affected areas)).   Yes [provider]    Past Medical History:  Diagnosis Date   AAA (abdominal aortic aneurysm) (HCC)    Arthritis    Asthma 1976   Bronchiolitis 06/2014   CAD (coronary artery disease)    NSTEMI 02/2010;  s/p CABG 1/12 (L-LAD, SOM1, S-PDA); echo in 08/2010: Mild LVH, EF 55%, grade 2 diastolic dysfunction, mild MR, mild LAE, mildly reduced RV function, mild RAE, PASP 42   Carpal tunnel syndrome of left wrist    Cecal angiodysplasia 04/04/2022   Colon polyps    COPD (chronic obstructive pulmonary disease) (HCC)    Diabetes mellitus 2000   Type II, controlls with diet   Dyspnea    ED (erectile dysfunction)    Gout    History of radiation therapy 03/09/2020-03/16/2020   SBRT right lung: Dr. Lynwood Nasuti   HTN (hypertension)    Hyperlipidemia    Inguinal hernia    Right   Macular degeneration    Myocardial infarction [facility]) 1 -13-2012   Neuropathy     Past Surgical History:  Procedure Laterality Date   3 vessel CABG  03/03/2010   ABDOMINAL AORTIC ENDOVASCULAR STENT GRAFT N/A 01/21/2019   Procedure: ABDOMINAL AORTIC ENDOVASCULAR STENT GRAFT;  Surgeon: Sheree Penne Bruckner, MD;  Location: [facility] OR;  Service: Vascular;  Laterality: N/A;   cataract left eye     COLONOSCOPY     neg 2009   COLONOSCOPY  2014   5 mm sessile polyp; AVM   COLONOSCOPY WITH PROPOFOL  N/A 04/04/2022   Procedure: COLONOSCOPY WITH PROPOFOL ;  Surgeon: Avram Lupita BRAVO, MD;  Location: [facility];  Service: Gastroenterology;  Laterality: N/A;   CORONARY ARTERY BYPASS GRAFT  2012   X3    ENDOVASCULAR REPAIR/STENT GRAFT  01/21/2019   ABDOMINAL AORTIC ENDOVASCULAR STENT GRAFT (N/A )   ESOPHAGOGASTRODUODENOSCOPY (EGD) WITH PROPOFOL  N/A 04/04/2022   Procedure: ESOPHAGOGASTRODUODENOSCOPY (EGD) WITH PROPOFOL ;  Surgeon: Avram Lupita BRAVO, MD;  Location: [facility];  Service: Gastroenterology;  Laterality: N/A;   EYE SURGERY     Left cataract   HERNIA REPAIR     HOT HEMOSTASIS N/A 04/04/2022   Procedure: HOT HEMOSTASIS (ARGON PLASMA COAGULATION/BICAP);  Surgeon: Avram Lupita BRAVO, MD;  Location: [facility];  Service: Gastroenterology;  Laterality: N/A;   INCISION AND DRAINAGE PERIRECTAL ABSCESS     INGUINAL HERNIA REPAIR  reports that he quit smoking about 20 years ago. His smoking use included cigarettes. He started smoking about 60 years ago. He has a 40 pack-year smoking history. He has been exposed to tobacco smoke. He has never used smokeless tobacco. He reports that he does not currently use alcohol  after a past usage of about 1.0 standard drink of alcohol  per week. He reports that he does not use drugs.  Family History  Problem Relation Age of Onset   Heart failure Mother    Heart disease Mother        CHF   Heart attack Mother    Hypertension Mother    Heart attack Father    Heart disease Father 24       MI   Hypertension Father    Heart attack Sister 58   Heart disease Sister 11       MI   Diabetes Sister    Hyperlipidemia Sister    Hypertension Sister    Varicose Veins Sister    Heart attack Brother 52   Diabetes Brother    Heart disease Brother 80       MI   Cancer Brother    Hypertension Brother    Deep vein thrombosis Daughter    Leukemia Daughter    Other Son        varicose veins   Arthritis/Rheumatoid Son    Lupus Daughter    Colon cancer Neg Hx      Physical Exam: Vitals:   08/28/23 0600 08/28/23 0641 08/28/23 0644 08/28/23 0648  BP:    (!) 153/61  Pulse:  71 69 65  Resp: 19 15 18  (!) 24  Temp:      TempSrc:      SpO2:  98% 98%  100%  Weight:      Height:        Gen: Awake, alert, chronically ill appearing   CV: Regular, normal S1, S2, 1/6 SEM  Resp: Normal WOB, CTAB  Abd: Flat, normoactive, nontender MSK: Symmetric, no edema  Skin: No rashes or lesions to exposed skin  Neuro: Alert and interactive, oriented fully  Psych: euthymic, appropriate    Data review:   Labs reviewed, notable for:   K5.6, creatinine 2.6 near baseline, blood glucose in the 400s, BNP 576, hemoglobin 6.9, macrocytic other blood counts normal.   Micro:  Results for orders placed or performed during the hospital encounter of 05/08/22  Resp panel by RT-PCR (RSV, Flu A&B, Covid) Anterior Nasal Swab     Status: None   Collection Time: 05/08/22  4:55 PM   Specimen: Anterior Nasal Swab  Result Value Ref Range Status   SARS Coronavirus 2 by RT PCR NEGATIVE NEGATIVE Final    Comment: (NOTE) SARS-CoV-2 target nucleic acids are NOT DETECTED.  The SARS-CoV-2 RNA is generally detectable in upper respiratory specimens during the acute phase of infection. The lowest concentration of SARS-CoV-2 viral copies this assay can detect is 138 copies/mL. A negative result does not preclude SARS-Cov-2 infection and should not be used as the sole basis for treatment or other patient management decisions. A negative result may occur with  improper specimen collection/handling, submission of specimen other than nasopharyngeal swab, presence of viral mutation(s) within the areas targeted by this assay, and inadequate number of viral copies(<138 copies/mL). A negative result must be combined with clinical observations, patient history, and epidemiological information. The expected result is Negative.  Fact Sheet for Patients:  BloggerCourse.com  Fact Sheet for Healthcare  Providers:  SeriousBroker.it  This test is no t yet approved or cleared by the [place]  FDA and  has been authorized for  detection and/or diagnosis of SARS-CoV-2 by FDA under an Emergency Use Authorization (EUA). This EUA will remain  in effect (meaning this test can be used) for the duration of the COVID-19 declaration under Section 564(b)(1) of the Act, 21 U.S.C.section 360bbb-3(b)(1), unless the authorization is terminated  or revoked sooner.       Influenza A by PCR NEGATIVE NEGATIVE Final   Influenza B by PCR NEGATIVE NEGATIVE Final    Comment: (NOTE) The Xpert Xpress SARS-CoV-2/FLU/RSV plus assay is intended as an aid in the diagnosis of influenza from Nasopharyngeal swab specimens and should not be used as a sole basis for treatment. Nasal washings and aspirates are unacceptable for Xpert Xpress SARS-CoV-2/FLU/RSV testing.  Fact Sheet for Patients: BloggerCourse.com  Fact Sheet for Healthcare Providers: SeriousBroker.it  This test is not yet approved or cleared by the [place]  FDA and has been authorized for detection and/or diagnosis of SARS-CoV-2 by FDA under an Emergency Use Authorization (EUA). This EUA will remain in effect (meaning this test can be used) for the duration of the COVID-19 declaration under Section 564(b)(1) of the Act, 21 U.S.C. section 360bbb-3(b)(1), unless the authorization is terminated or revoked.     Resp Syncytial Virus by PCR NEGATIVE NEGATIVE Final    Comment: (NOTE) Fact Sheet for Patients: BloggerCourse.com  Fact Sheet for Healthcare Providers: SeriousBroker.it  This test is not yet approved or cleared by the [place]  FDA and has been authorized for detection and/or diagnosis of SARS-CoV-2 by FDA under an Emergency Use Authorization (EUA). This EUA will remain in effect (meaning this test can be used) for the duration of the COVID-19 declaration under Section 564(b)(1) of the Act, 21 U.S.C. section 360bbb-3(b)(1), unless the authorization is  terminated or revoked.  Performed at [facility], [address], [place], [place] [address]     Imaging reviewed:  CT CHEST ABDOMEN PELVIS WO CONTRAST Result Date: 08/28/2023 CLINICAL DATA:  Groin lymphadenopathy. Right red blood per rectum. Shortness of breath. EXAM: CT CHEST, ABDOMEN AND PELVIS WITHOUT CONTRAST TECHNIQUE: Multidetector CT imaging of the chest, abdomen and pelvis was performed following the standard protocol without IV contrast. RADIATION DOSE REDUCTION: This exam was performed according to the departmental dose-optimization program which includes automated exposure control, adjustment of the mA and/or kV according to patient size and/or use of iterative reconstruction technique. COMPARISON:  04/26/2023 FINDINGS: CT CHEST FINDINGS Cardiovascular: Heart is normal size. Aorta is normal caliber. Prior CABG. Diffuse aortic atherosclerosis. Mediastinum/Nodes: No mediastinal, hilar, or axillary adenopathy. Trachea and esophagus are unremarkable. Thyroid  unremarkable. Lungs/Pleura: Moderate to advanced emphysema. Small right pleural effusion, similar to prior study. Compressive atelectasis in the right lower lobe. Calcified pleural plaque at the right lung base is unchanged. Left upper lobe pulmonary nodule on image 40 measures 4 mm compared to 5 mm previously. Postradiation bandlike scarring in the right upper lobe is unchanged. Lingular nodule measures 4 mm compared to 6 mm previously. No new or enlarging pulmonary nodules. Musculoskeletal: Chest wall soft tissues are unremarkable. No acute bony abnormality. CT ABDOMEN PELVIS FINDINGS Hepatobiliary: Gallbladder is contracted. Small layering gallstones likely present. No biliary ductal dilatation or focal hepatic abnormality. Pancreas: No focal abnormality or ductal dilatation. Spleen: No focal abnormality.  Normal size. Adrenals/Urinary Tract: Renovascular calcifications. No definite urinary tract calcifications or  hydronephrosis. No focal renal or adrenal abnormality. Urinary  bladder unremarkable. Stomach/Bowel: Normal appendix. Left colonic diverticulosis. No active diverticulitis. Stomach and small bowel decompressed, unremarkable. No obstruction or inflammatory process. Vascular/Lymphatic: Status post stent graft repair of abdominal aortic aneurysm. Maximum aneurysm sac size 2.8 cm. No adenopathy. Reproductive: Mildly enlarged prostate Other: No free fluid or free air. Musculoskeletal: No acute bony abnormality. Degenerative disc and facet disease in the lumbar spine. IMPRESSION: Small right pleural effusion with compressive atelectasis in the right lower lobe, similar prior study. Scattered small pulmonary nodules, unchanged. No new or enlarging pulmonary nodules. Moderate to advanced emphysema. Probable small layering gallstones within the gallbladder which is contracted. No CT evidence of acute cholecystitis. Aortic atherosclerosis.  Status post stent graft repair of AAA. Prostate enlargement. Left colonic diverticulosis. No acute findings in the chest, abdomen or pelvis. Electronically Signed   By: Franky Crease M.D.   On: 08/28/2023 00:33   DG Chest Portable 1 View Result Date: 08/27/2023 CLINICAL DATA:  Shortness of breath EXAM: PORTABLE CHEST 1 VIEW COMPARISON:  05/08/2022, CT chest 04/26/2023, chest x-ray 05/08/2022, chest CT 07/23/2022 FINDINGS: Post sternotomy changes. Emphysema. Small right-sided pleural effusion. Cardiomegaly with aortic atherosclerosis. Irregular ovoid focus of density in the right upper lung, appears slightly more dense and ovoid in configuration compared to prior radiograph IMPRESSION: 1. Emphysema. Small right pleural effusion. 2. Irregular ovoid focus of density in the right upper lung, appears slightly more dense and ovoid in configuration compared to prior radiograph. On previous exams, this was felt secondary to radiation change, given increased density, nodule/recurrence cannot be  excluded and correlation with chest CT is suggested. Other known left-sided pulmonary nodules are not well seen radiographically. Electronically Signed   By: Luke Bun M.D.   On: 08/27/2023 23:15      ED Course:  Ordered for 1 unit of blood, pantoprazole  40 mg IV, Lasix  40 mg IV.  EDP messaged GI Dr. Charlanne, group to see in a.m.   Assessment/Plan:  89 y.o. male with hx CAD, CABG, COPD, CHRF on 6 L O2, CKD4, diabetes type 2, hypertension, hyperlipidemia, alzheimers / vascular dementia, aortic aneurysm, hx of GI bleeding with prior Cecal AVM treated with APC in 2/'24; EGD at that time was normal, who presents with mixed dark / bright red blood in stools. Found to have symptomatic anemia with Hb 6.9. Suspect recurrent lower GI bleed   GI bleeding, suspect lower source  Symptomatic anemia, requiring transfusion Hx of cecal AVM treated with APC 2/'24  - EDP consulted [facility] Dr. Charlanne, group to see in AM. - S/p 1 unit RBC, trend hemoglobin - Continue pantoprazole  40 mg IV twice daily for now in case of upper GI source - N.p.o. after midnight in case plan for upper scope, discussed with patient may require repeat colonoscopy and prep.  Question mild HFpEF exacerbation -S/p Lasix  40 mg IV along with blood transfusion.  -Resume home Lasix  at 40 mg daily -Do not feel requires repeat echo at this time  CKD stage IV, stable Mild hyperkalemia - Treated with Lokelma  x 1.  Diabetes type 2 with hyperglycemia - SSI with at bedtime correction - Check A1c  Chronic medical problems: CAD, CABG: Hold home aspirin  in setting of bleed, continue simvastatin  COPD, CHRF on 6 L O2: Continue chronic prednisone  20 mg daily, on Bactrim  for PJP prophylaxis while on chronic steroid, DuoNebs every 6 hour prn, continue home Anoro Ellipta  Hypertension: Not currently on antihypertensives Hyperlipidemia: See CAD above Alzheimer/vascular dementia: Fully oriented on admission.  Continue home memantine   History  aortic aneurysm: Surveillance outpatient Mood disorder: Continue home escitalopram   Body mass index is 23.37 kg/m.    DVT prophylaxis:  SCDs Code Status:  Full Code Diet:  Diet Orders (From admission, onward)     Start     Ordered   08/28/23 0334  Diet NPO time specified Except for: Sips with Meds  Diet effective now       Question:  Except for  Answer:  Noralyn with Meds   08/28/23 0334           Family Communication:  None   Consults:  GI    Admission status:   Observation, Telemetry bed  Severity of Illness: The appropriate patient status for this patient is OBSERVATION. Observation status is judged to be reasonable and necessary in order to provide the required intensity of service to ensure the patient's safety. The patient's presenting symptoms, physical exam findings, and initial radiographic and laboratory data in the context of their medical condition is felt to place them at decreased risk for further clinical deterioration. Furthermore, it is anticipated that the patient will be medically stable for discharge from the hospital within 2 midnights of admission.    Dorn Dawson, MD [facility]  How to contact the [facility] Attending or Consulting provider 7A - 7P or covering provider during after hours 7P -7A, for this patient.  Check the care team in [facility] and look for a) attending/consulting [facility] provider listed and b) the [facility] team listed Log into [email or web address] and use [facility]'s universal password to access. If you do not have the password, please contact the hospital operator. Locate the [facility] provider you are looking for under [facility] and page to a number that you can be directly reached. If you still have difficulty reaching the provider, please page the [facility] (Director on Call) for the Hospitalists listed on amion for assistance.  08/28/2023, 7:06 AM

## 2023-08-28 NOTE — Hospital Course (Signed)
 Mr. Piccione was admitted to the hospital with the working diagnosis of rectal bleeding  89 yo male with the past medical history of coronary artery disease, COPD, T2DM, hypertension, hyperlipidemia, vascular dementia, and cecal AVMs who presented with rectal bleeding. Reported few days of melena and hematochezia with right lower quadrant abdominal pain, bit no nausea or vomiting, no hematemesis. On his initial physical examination his blood pressure was 153/61, HR 69, RR 24 and 02 saturation 98% Lungs with no wheezing or rhonchi, heart with S1 and S2 present and regular, abdomen with no distention and no lower extremity edema.   Na 142, K 5,6 Cl 102 bicarbonate 27 glucose 469, bun 69 cr 2,68, AST 16 ALT 15 BNP 576  Wbc 7,9 hgb 6,9 plt 151  Urine analysis SG 1,008, protein negative, leukocytes negative, hgb negative    Chest radiograph with hyperinflation and small right pleural effusion, small linear round opacity right  upper lobe. (Possible radiation changes, increased density, will need follow up CT chest.  Sternotomy wires in place.   EKG 67 bpm, left axis deviation, normal intervals, qtc 445, sinus rhythm with no significant ST segment or T wave changes.

## 2023-08-28 NOTE — Plan of Care (Signed)
   Problem: Education: Goal: Ability to describe self-care measures that may prevent or decrease complications (Diabetes Survival Skills Education) will improve Outcome: Progressing Goal: Individualized Educational Video(s) Outcome: Progressing

## 2023-08-28 NOTE — Assessment & Plan Note (Signed)
 No signs of acute exacerbation Continue diuresis with furosemide  40 mg po daily.

## 2023-08-28 NOTE — Consult Note (Addendum)
 Consultation Note   Referring Provider:  [facility] PCP: Geofm Glade PARAS, MD Primary Gastroenterologist: Gwendlyn Buddy, MD  (retired)     Reason for Consultation: GI bleed  DOA: 08/27/2023         Hospital Day: 2   ASSESSMENT    89 year old male with painless GI bleeding, presumably lower with dark red stool on DRE .  Of note oral iron is on his home med list ( ? Taking). 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   Acute on chronic anemia 2/2 to above.  Macrocytosis   History of iron deficiency Presenting hemoglobin 6.9, down from 10 1 year ago.  Despite macrocytosis, he is most likely still iron deficient with a ferritin of 26 ( not clear if he has been taking oral iron at home) .  Normal folate and normal B12 levels  History of adenomatous colon polyps.  No polyps on colonoscopy in February 2024  CKD 4  COPD on home O2 History of right lung cancer status post radiation He is [facility] on exam despite supplemental 02.  Chest x-ray yesterday showing small right pleural effusion.   Abnormal CXR  Irregular ovoid focus of density in the right upper lung, appears slightly more dense and ovoid in configuration compared to prior radiograph. On previous exams, this was felt secondary to radiation change, given increased density, nodule/recurrence cannot be excluded and correlation with chest CT is suggested.   CAD status post CABG On daily asa  History of AAA status post prior stent/graft  DM2  See PMH for additional history  Principal Problem:   Symptomatic anemia     PLAN:   --Trend H&H.  Hemoglobin up from 6.9-8.1 post 1 unit of RBCs. -- Will observe closely for now.  Pending clinical course he may need a tagged RBC scan to try and localize the bleeding.  Trying to avoid CT angio given CKD 4.  Additionally would like to avoid endoscopic procedures given advanced age,  comorbidities including COPD --No felt to be upper bleed but empiric PPI is reasonable.   HPI   Brief GI history 89 year old male known to us  for a history of adenomatous colon polyps, cecal AVMs.  He was seen by us  in the hospital February 2020 for for iron deficiency anemia and dark stools. EGD was normal.  2 nonbleeding cecal AVMs were treated with APC and felt to probably be the cause of his iron deficiency anemia  Interval history  Patient is a limited historian.   He presented to ED yesterday after outpatient labs showed worsening anemia. He reported dark stool with bright red blood.  He tells me that for weeks he has been intermittently seeing a small amount of red blood when he wipes but bleeding increased in volume over the last couple of days.  He does not recall stools being dark.  He has not had any abdominal pain associated with the bleeding.  No nausea/vomiting or other GI symptoms.  He is unaware of which medications that he takes at home.  I reviewed home med list and appears he is taking a baby aspirin  but no other antiplatelet meds /anticoagulants.   In the ED he  was hemodynamically stable . His hemoglobin was 6.9, down from 10.21-year ago.  MCV elevated 109, platelets and white count are normal.  He has CKD.  Renal function appears to be at baseline with BUN of 28, creatinine of 2.5.  B12 elevated at 1087, normal folate. He had a noncontrast CT scan of the abdomen and pelvis without any acute findings  Additional history Patient has a history of CAD status post CABG , AAA status post repair , hypertension , CKD4,  DM 2 , COPD on chronic O2 , lung cancer status post radiation, cholelithiasis,  prostamegaly, diverticulosis, adenomatous colon polyps, and cecal AVMs cecal AVMs   Labs and Imaging:  Recent Labs    08/27/23 2154  PROT 5.3*  ALBUMIN 3.2*  AST 16  ALT 15  ALKPHOS 43  BILITOT 0.6   Recent Labs    08/27/23 2154 08/28/23 0500  WBC 7.9 9.0  HGB 6.9* 8.1*   HCT 23.4* 26.7*  MCV 109.9* 103.9*  PLT 151 157   Recent Labs    08/27/23 2154 08/28/23 0500  NA 142 142  K 5.6* 4.8  CL 102 103  CO2 27 29  GLUCOSE 469* 147*  BUN 59* 58*  CREATININE 2.68* 2.55*  CALCIUM 8.5* 8.5*     CT CHEST ABDOMEN PELVIS WO CONTRAST CLINICAL DATA:  Groin lymphadenopathy. Right red blood per rectum. Shortness of breath.  EXAM: CT CHEST, ABDOMEN AND PELVIS WITHOUT CONTRAST  TECHNIQUE: Multidetector CT imaging of the chest, abdomen and pelvis was performed following the standard protocol without IV contrast.  RADIATION DOSE REDUCTION: This exam was performed according to the departmental dose-optimization program which includes automated exposure control, adjustment of the mA and/or kV according to patient size and/or use of iterative reconstruction technique.  COMPARISON:  04/26/2023  FINDINGS: CT CHEST FINDINGS  Cardiovascular: Heart is normal size. Aorta is normal caliber. Prior CABG. Diffuse aortic atherosclerosis.  Mediastinum/Nodes: No mediastinal, hilar, or axillary adenopathy. Trachea and esophagus are unremarkable. Thyroid  unremarkable.  Lungs/Pleura: Moderate to advanced emphysema. Small right pleural effusion, similar to prior study. Compressive atelectasis in the right lower lobe. Calcified pleural plaque at the right lung base is unchanged. Left upper lobe pulmonary nodule on image 40 measures 4 mm compared to 5 mm previously. Postradiation bandlike scarring in the right upper lobe is unchanged. Lingular nodule measures 4 mm compared to 6 mm previously. No new or enlarging pulmonary nodules.  Musculoskeletal: Chest wall soft tissues are unremarkable. No acute bony abnormality.  CT ABDOMEN PELVIS FINDINGS  Hepatobiliary: Gallbladder is contracted. Small layering gallstones likely present. No biliary ductal dilatation or focal hepatic abnormality.  Pancreas: No focal abnormality or ductal dilatation.  Spleen: No focal  abnormality.  Normal size.  Adrenals/Urinary Tract: Renovascular calcifications. No definite urinary tract calcifications or hydronephrosis. No focal renal or adrenal abnormality. Urinary bladder unremarkable.  Stomach/Bowel: Normal appendix. Left colonic diverticulosis. No active diverticulitis. Stomach and small bowel decompressed, unremarkable. No obstruction or inflammatory process.  Vascular/Lymphatic: Status post stent graft repair of abdominal aortic aneurysm. Maximum aneurysm sac size 2.8 cm. No adenopathy.  Reproductive: Mildly enlarged prostate  Other: No free fluid or free air.  Musculoskeletal: No acute bony abnormality. Degenerative disc and facet disease in the lumbar spine.  IMPRESSION: Small right pleural effusion with compressive atelectasis in the right lower lobe, similar prior study.  Scattered small pulmonary nodules, unchanged. No new or enlarging pulmonary nodules.  Moderate to advanced emphysema.  Probable small layering gallstones within the gallbladder which is  contracted. No CT evidence of acute cholecystitis.  Aortic atherosclerosis.  Status post stent graft repair of AAA.  Prostate enlargement.  Left colonic diverticulosis.  No acute findings in the chest, abdomen or pelvis.  Electronically Signed   By: Franky Crease M.D.   On: 08/28/2023 00:33    Pertinent GI Studies   04/04/2022 EGD and colonoscopy done for iron deficiency anemia and reported dark stools EGD : normal esophagus.  Normal stomach.  Normal examined duodenum.  No specimens collected  Colonoscopy: Bowel prep was good.  2 nonbleeding colonic angiodysplastic lesions in the cecum.  Treated with APC.  Suspected to be the cause of IDA secondary to chronic GI blood loss.  Sigmoid diverticulosis.  Remainder of exam normal   Past Medical History:  Diagnosis Date   AAA (abdominal aortic aneurysm) (HCC)    Arthritis    Asthma 1976   Bronchiolitis 06/2014   CAD (coronary artery  disease)    NSTEMI 02/2010;  s/p CABG 1/12 (L-LAD, SOM1, S-PDA); echo in 08/2010: Mild LVH, EF 55%, grade 2 diastolic dysfunction, mild MR, mild LAE, mildly reduced RV function, mild RAE, PASP 42   Carpal tunnel syndrome of left wrist    Cecal angiodysplasia 04/04/2022   Colon polyps    COPD (chronic obstructive pulmonary disease) (HCC)    Diabetes mellitus 2000   Type II, controlls with diet   Dyspnea    ED (erectile dysfunction)    Gout    History of radiation therapy 03/09/2020-03/16/2020   SBRT right lung: Dr. Lynwood Nasuti   HTN (hypertension)    Hyperlipidemia    Inguinal hernia    Right   Macular degeneration    Myocardial infarction [facility]) 1 -13-2012   Neuropathy     Past Surgical History:  Procedure Laterality Date   3 vessel CABG  03/03/2010   ABDOMINAL AORTIC ENDOVASCULAR STENT GRAFT N/A 01/21/2019   Procedure: ABDOMINAL AORTIC ENDOVASCULAR STENT GRAFT;  Surgeon: Sheree Penne Bruckner, MD;  Location: [facility] OR;  Service: Vascular;  Laterality: N/A;   cataract left eye     COLONOSCOPY     neg 2009   COLONOSCOPY  2014   5 mm sessile polyp; AVM   COLONOSCOPY WITH PROPOFOL  N/A 04/04/2022   Procedure: COLONOSCOPY WITH PROPOFOL ;  Surgeon: Avram Lupita BRAVO, MD;  Location: [facility];  Service: Gastroenterology;  Laterality: N/A;   CORONARY ARTERY BYPASS GRAFT  2012   X3   ENDOVASCULAR REPAIR/STENT GRAFT  01/21/2019   ABDOMINAL AORTIC ENDOVASCULAR STENT GRAFT (N/A )   ESOPHAGOGASTRODUODENOSCOPY (EGD) WITH PROPOFOL  N/A 04/04/2022   Procedure: ESOPHAGOGASTRODUODENOSCOPY (EGD) WITH PROPOFOL ;  Surgeon: Avram Lupita BRAVO, MD;  Location: [facility];  Service: Gastroenterology;  Laterality: N/A;   EYE SURGERY     Left cataract   HERNIA REPAIR     HOT HEMOSTASIS N/A 04/04/2022   Procedure: HOT HEMOSTASIS (ARGON PLASMA COAGULATION/BICAP);  Surgeon: Avram Lupita BRAVO, MD;  Location: [facility];  Service: Gastroenterology;  Laterality: N/A;   INCISION AND DRAINAGE PERIRECTAL ABSCESS      INGUINAL HERNIA REPAIR      Family History  Problem Relation Age of Onset   Heart failure Mother    Heart disease Mother        CHF   Heart attack Mother    Hypertension Mother    Heart attack Father    Heart disease Father 41       MI   Hypertension Father    Heart attack Sister 36  Heart disease Sister 105       MI   Diabetes Sister    Hyperlipidemia Sister    Hypertension Sister    Varicose Veins Sister    Heart attack Brother 84   Diabetes Brother    Heart disease Brother 60       MI   Cancer Brother    Hypertension Brother    Deep vein thrombosis Daughter    Leukemia Daughter    Other Son        varicose veins   Arthritis/Rheumatoid Son    Lupus Daughter    Colon cancer Neg Hx     Prior to Admission medications   Medication Sig Start Date End Date Taking? Authorizing Provider  albuterol  (VENTOLIN  HFA) 108 (90 Base) MCG/ACT inhaler Inhale 2 puffs into the lungs every 6 (six) hours as needed for wheezing or shortness of breath. 01/15/22  Yes Geofm Glade PARAS, MD  ANORO ELLIPTA  62.5-25 MCG/ACT AEPB USE 1 INHALATION DAILY 07/11/22  Yes Burns, Glade PARAS, MD  Aspirin  81 MG EC tablet Take 81 mg by mouth daily.   Yes [provider]  Carboxymethylcellulose Sodium (THERATEARS) 0.25 % SOLN Place 1-2 drops into both eyes 4 (four) times daily as needed (dry eyes.).   Yes [provider]  Cholecalciferol  (VITAMIN D3) 50 MCG (2000 UT) capsule Take 2,000 Units by mouth daily.   Yes [provider]  Cyanocobalamin  1000 MCG CAPS Take 1,000 mcg by mouth daily.   Yes [provider]  escitalopram  (LEXAPRO ) 20 MG tablet Take 20 mg by mouth daily. Take one tablet (20mg ) by mouth daily. 04/30/22  Yes [provider]  ferrous sulfate  325 (65 FE) MG tablet Take 1 tablet (325 mg total) by mouth daily with breakfast. 09/28/22  Yes Burns, Glade PARAS, MD  Homeopathic Products [facility] MUSCLE CRAMPS) FOAM Apply 2 Pump topically daily as needed (Lower leg  or foot muscle cramps). Apply two pumps of foam to calf or top of foot daily as needed for muscle cramps   Yes [provider]  Magnesium  Oxide 420 MG TABS Take 420 mg by mouth at bedtime.   Yes [provider]  memantine  (NAMENDA ) 10 MG tablet Take 1 tablet (10 mg) twice a day Patient taking differently: Take 5 mg by mouth 2 (two) times daily. Take one-half tablet (5mg ) by mouth twice daily. 01/25/22  Yes Wertman, Sara E, PA-C  mirtazapine  (REMERON ) 15 MG tablet TAKE 1 TABLET(15 MG) BY MOUTH AT BEDTIME 01/21/23  Yes Burns, Glade PARAS, MD  OXYGEN  Inhale 6 L/min into the lungs continuous.   Yes [provider]  predniSONE  (DELTASONE ) 10 MG tablet TAKE 2 TABLETS(20 MG) BY MOUTH DAILY Patient taking differently: Take 10 mg by mouth 2 (two) times daily with a meal. 08/16/22  Yes Byrum, Lamar RAMAN, MD  simvastatin  (ZOCOR ) 40 MG tablet Take 1 tablet (40 mg total) by mouth daily. 02/27/23  Yes Burns, Glade PARAS, MD  sulfamethoxazole -trimethoprim  (BACTRIM  DS) 800-160 MG tablet TAKE 1 TABLET BY MOUTH AS A SINGLE DOSE. TAKE 1 TABLET ON MONDAY, WEDNESDAY, AND FRIDAY 06/05/23  Yes Byrum, Lamar RAMAN, MD  SYSTANE COMPLETE PF 0.6 % SOLN Place 1 drop into both eyes 4 (four) times daily.   Yes [provider]  triamcinolone  cream (KENALOG ) 0.1 % Apply 1 application  topically daily as needed (for dryness/itching (affected areas)).   Yes [provider]    Current Facility-Administered Medications  Medication Dose Route Frequency Provider Last Rate  Last Admin   cyanocobalamin  (VITAMIN B12) tablet 1,000 mcg  1,000 mcg Oral Daily Segars, Dorn, MD       escitalopram  (LEXAPRO ) tablet 20 mg  20 mg Oral Daily Segars, Dorn, MD       furosemide  (LASIX ) tablet 40 mg  40 mg Oral Daily Segars, Dorn, MD       insulin  aspart (novoLOG ) injection 0-5 Units  0-5 Units Subcutaneous QHS Segars, Jonathan, MD       insulin  aspart (novoLOG ) injection 0-6 Units  0-6 Units Subcutaneous TID WC  Segars, Dorn, MD       ipratropium-albuterol  (DUONEB) 0.5-2.5 (3) MG/3ML nebulizer solution 3 mL  3 mL Nebulization Q6H PRN Segars, Dorn, MD       magnesium  oxide (MAG-OX) tablet 400 mg  400 mg Oral QHS Segars, Dorn, MD       memantine  (NAMENDA ) tablet 5 mg  5 mg Oral BID Segars, Jonathan, MD   5 mg at 08/28/23 0502   mirtazapine  (REMERON ) tablet 15 mg  15 mg Oral QHS Segars, Dorn, MD       pantoprazole  (PROTONIX ) injection 40 mg  40 mg Intravenous Q12H Melvenia Motto, MD   40 mg at 08/27/23 2315   predniSONE  (DELTASONE ) tablet 10 mg  10 mg Oral BID WC Segars, Dorn, MD       simvastatin  (ZOCOR ) tablet 40 mg  40 mg Oral Daily Segars, Dorn, MD       sodium chloride  flush (NS) 0.9 % injection 3 mL  3 mL Intravenous Q12H Segars, Jonathan, MD       sulfamethoxazole -trimethoprim  (BACTRIM  DS) 800-160 MG per tablet 1 tablet  1 tablet Oral Once per day on Monday Wednesday Friday Keturah Dorn, MD       umeclidinium-vilanterol (ANORO ELLIPTA ) 62.5-25 MCG/ACT 1 puff  1 puff Inhalation Daily Segars, Jonathan, MD        Allergies as of 08/27/2023 - Review Complete 08/27/2023  Allergen Reaction Noted   Meperidine hcl Swelling and Other (See Comments) 09/24/2006   Sertraline  Shortness Of Breath and Other (See Comments) 05/01/2021   Gabapentin  Other (See Comments) 05/11/2019   Memantine  Other (See Comments) 04/02/2022   Latex Rash and Other (See Comments) 05/08/2022   Zolpidem tartrate Other (See Comments)     Social History   Socioeconomic History   Marital status: Widowed    Spouse name: Not on file   Number of children: 3   Years of education: 29   Highest education level: 12th grade  Occupational History   Occupation: RETIRED    Employer: RETIRED  Tobacco Use   Smoking status: Former    Current packs/day: 0.00    Average packs/day: 1 pack/day for 40.0 years (40.0 ttl pk-yrs)    Types: Cigarettes    Start date: 02/20/1963    Quit date: 02/20/2003    Years since  quitting: 20.5    Passive exposure: Past   Smokeless tobacco: Never  Vaping Use   Vaping status: Never Used  Substance and Sexual Activity   Alcohol  use: Not Currently    Alcohol /week: 1.0 standard drink of alcohol     Types: 1 Shots of liquor per week    Comment: 1 drinks a week   Drug use: No   Sexual activity: Not on file  Other Topics Concern   Not on file  Social History Narrative   Quit smoking in 1998. Retired Tax adviser. Regular exercise- no.    One story home   Right handed  3 children   Drinks caffeine   Social Drivers of Corporate investment banker Strain: Low Risk  (05/09/2023)   Overall Financial Resource Strain (CARDIA)    Difficulty of Paying Living Expenses: Not hard at all  Food Insecurity: No Food Insecurity (08/28/2023)   Hunger Vital Sign    Worried About Running Out of Food in the Last Year: Never true    Ran Out of Food in the Last Year: Never true  Transportation Needs: No Transportation Needs (08/28/2023)   PRAPARE - Administrator, Civil Service (Medical): No    Lack of Transportation (Non-Medical): No  Physical Activity: Insufficiently Active (05/09/2023)   Exercise Vital Sign    Days of Exercise per Week: 7 days    Minutes of Exercise per Session: 20 min  Stress: No Stress Concern Present (05/09/2023)   [employer] of Occupational Health - Occupational Stress Questionnaire    Feeling of Stress : Not at all  Recent Concern: Stress - Stress Concern Present (04/01/2023)   [employer] of Occupational Health - Occupational Stress Questionnaire    Feeling of Stress : Rather much  Social Connections: Socially Isolated (08/28/2023)   Social Connection and Isolation Panel    Frequency of Communication with Friends and Family: More than three times a week    Frequency of Social Gatherings with Friends and Family: More than three times a week    Attends Religious Services: Never    Database administrator or Organizations: No    Attends  Banker Meetings: Never    Marital Status: Widowed  Intimate Partner Violence: Not At Risk (08/28/2023)   Humiliation, Afraid, Rape, and Kick questionnaire    Fear of Current or Ex-Partner: No    Emotionally Abused: No    Physically Abused: No    Sexually Abused: No     Code Status   Code Status: Full Code  Review of Systems: All systems reviewed and negative except where noted in HPI.  Physical Exam: Vital signs in last 24 hours: Temp:  [97.7 F (36.5 C)-98.7 F (37.1 C)] 97.7 F (36.5 C) (07/09 0818) Pulse Rate:  [62-79] 71 (07/09 0818) Resp:  [15-27] 18 (07/09 0818) BP: (119-171)/(47-73) 171/71 (07/09 0818) SpO2:  [96 %-100 %] 100 % (07/09 0818) Weight:  [76 kg] 76 kg (07/08 2204) Last BM Date : 08/27/23  General:  Pleasant male in NAD Psych:  Cooperative. Normal mood and affect Eyes: Pupils equal Ears:  Normal auditory acuity Nose: No deformity, discharge or lesions Neck:  Supple, no masses felt Lungs: Diminished breath sounds bilaterally, expiratory wheezing.  Heart:  Regular rate, regular rhythm.  Abdomen:  Soft, nondistended, nontender, active bowel sounds, no masses felt Rectal : On DRE there was a scant amount of dark red stool .  No rectal masses found Msk: Symmetrical without gross deformities.  Neurologic:  Alert, oriented, grossly normal neurologically Extremities : No edema Skin:  Intact without significant lesions.    Intake/Output from previous day: 07/08 0701 - 07/09 0700 In: 353 [I.V.:33; Blood:320] Out: 220 [Urine:220] Intake/Output this shift:  No intake/output data recorded.   Vina Dasen, NP-C   08/28/2023, 8:57 AM  I have taken an interval history, thoroughly reviewed the chart and examined the patient. I agree with the Advanced Practitioner's note, impression and recommendations, and have recorded additional findings, impressions and recommendations below. I performed a substantive portion of this encounter (>50% time  spent), including a complete performance of the medical  decision making.  My additional thoughts are as follows:  Complex patient, limited historian.  Acute GI bleeding that appears likely lower based on our evaluation today.  History of cecal AVMs and left-sided diverticulosis on last colonoscopy as inpatient February 2024.  Patient is hemodynamically stable and has been transfused.  Benign abdominal exam.  Acute blood loss anemia but also perhaps other contributing factors since it is microcytic.  Suspect there may be an element of CKD related iron malabsorption/deficiency here as well. He has received some PRBCs, but my opinion is that a single dose of IV iron while hospitalized would also be beneficial.  Currently holding off on any endoscopic procedures until we see the volume and frequency of bleeding that he is having during hospital observation.  He would be high risk for endoscopic complications (particularly respiratory).  If he has brisk GI bleeding with large-volume black or bloody stool, please send him for a tagged RBC scan.  Cannot have CTA due to chronically elevated creatinine   [name] Office:[phone]

## 2023-08-28 NOTE — Care Management Obs Status (Cosign Needed)
 MEDICARE OBSERVATION STATUS NOTIFICATION   Patient Details  Name: KINCAID TIGER MRN: [id number] Date of Birth: March 29, 1931   Medicare Observation Status Notification Given:  Yes    Rosaline JONELLE Joe, RN 08/28/2023, 2:30 PM

## 2023-08-28 NOTE — Progress Notes (Addendum)
 Transition of Care [facility]) - Inpatient Brief Assessment   Patient Details  Name: MALACAI GRANTZ MRN: [id number] Date of Birth: August 08, 1931  Transition of Care [facility]) CM/SW Contact:    Rosaline JONELLE Joe, RN Phone Number: 08/28/2023, 4:03 PM   Clinical Narrative: CM met with the patient and daughter at the bedside to discuss TOC needs.  The patient's daughter was provided with [facility] letter.  Patient admitted from home and plans to return home with family when stable for discharge.  Patient has DME at home including home oxygen  thru [employer] at 5-6 L/min [place], [place].  PCP follow up with [facility]  No other TOC needs at this time.   Transition of Care Asessment: Insurance and Status: (P) Insurance coverage has been reviewed Patient has primary care physician: (P) Yes Home environment has been reviewed: (P) from home with daughter/son Prior level of function:: (P) [place] Prior/Current Home Services: (P) No current home services Social Drivers of Health Review: (P) SDOH reviewed interventions complete Readmission risk has been reviewed: (P) Yes Transition of care needs: (P) no transition of care needs at this time

## 2023-08-28 NOTE — Assessment & Plan Note (Addendum)
 No further rectal bleeding.  Sp one unit PRBC transfusion.   Follow up hgb is 8.1  Continue close monitoring, if recurrent bleeding will need bleeding scan. Hold on endoscopy for now.  Advance diet to clears for now Continue pantoprazole    Symptomatic anemia, with anemia of chronic disease.  Iron panel with serum iron at 179, TIBC 356, transferrin saturation 50 and ferritin 26

## 2023-08-28 NOTE — Assessment & Plan Note (Addendum)
 Continue memantine , mirtazapine ,

## 2023-08-28 NOTE — Progress Notes (Signed)
  Echocardiogram 2D Echocardiogram has been performed.  Brian [name] 08/28/2023, 4:18 PM

## 2023-08-28 NOTE — Progress Notes (Signed)
 Heart Failure Navigator Progress Note  Assessed for Heart & Vascular TOC clinic readiness.  Patient does not meet criteria due to per MD note , patient with history of Dementia. .   Navigator will sign off at this time.   Stephane Haddock, BSN, Scientist, clinical (histocompatibility and immunogenetics) Only

## 2023-08-28 NOTE — Assessment & Plan Note (Signed)
 No chest pain, continue to hold on antiplatelet therapy due to acute anemia

## 2023-08-28 NOTE — Assessment & Plan Note (Signed)
 Patient was placed on insulin sliding scale for glucose cover and monitoring during her hospitalization.  Continue statin.

## 2023-08-28 NOTE — ED Notes (Signed)
 Patient transported to CT

## 2023-08-28 NOTE — Assessment & Plan Note (Signed)
 Continue blood pressure monitoring  Systolic blood pressure in the 140 mmHg range.

## 2023-08-28 NOTE — Plan of Care (Signed)

## 2023-08-28 NOTE — Assessment & Plan Note (Signed)
 No signs of acute exacerbation  Continue chronic steroids and PJP prophylaxis.  Continue bronchodilator therapy

## 2023-08-28 NOTE — Progress Notes (Signed)
 Progress Note   Patient: Brian [name] FMW:[id number] DOB: Mar 27, 1931 DOA: 08/27/2023     0 DOS: the patient was seen and examined on 08/28/2023   Brief hospital course: Mr. Koerber was admitted to the hospital with the working diagnosis of rectal bleeding  89 yo male with the past medical history of coronary artery disease, COPD, T2DM, hypertension, hyperlipidemia, vascular dementia, and cecal AVMs who presented with rectal bleeding. Reported few days of melena and hematochezia with right lower quadrant abdominal pain, bit no nausea or vomiting, no hematemesis. On his initial physical examination his blood pressure was 153/61, HR 69, RR 24 and 02 saturation 98% Lungs with no wheezing or rhonchi, heart with S1 and S2 present and regular, abdomen with no distention and no lower extremity edema.   Na 142, K 5,6 Cl 102 bicarbonate 27 glucose 469, bun 69 cr 2,68, AST 16 ALT 15 BNP 576  Wbc 7,9 hgb 6,9 plt 151  Urine analysis SG 1,008, protein negative, leukocytes negative, hgb negative    Chest radiograph with hyperinflation and small right pleural effusion, small linear round opacity right  upper lobe. (Possible radiation changes, increased density, will need follow up CT chest.  Sternotomy wires in place.   EKG 67 bpm, left axis deviation, normal intervals, qtc 445, sinus rhythm with no significant ST segment or T wave changes.   Assessment and Plan: * Lower GI bleed No further rectal bleeding.  Sp one unit PRBC transfusion.   Follow up hgb is 8.1  Continue close monitoring, if recurrent bleeding will need bleeding scan. Hold on endoscopy for now.  Advance diet to clears for now Continue pantoprazole    Symptomatic anemia, with anemia of chronic disease.  Iron panel with serum iron at 179, TIBC 356, transferrin saturation 50 and ferritin 26   Chronic diastolic heart failure (HCC) No signs of acute exacerbation Continue diuresis with furosemide  40 mg po daily.   CKD (chronic kidney  disease) stage 4, GFR 15-29 ml/min (HCC) Hyperkalemia and hyper P   Renal function with serum cr at 2.5 with K at 4,8 and serum bicarbonate at 29  Mg 2.2 and P 5.3  Continue furosemide  and follow up renal function and electrolytes in am.  Avoid hypotension and nephrotoxic medications   CAD (coronary artery disease) No chest pain, continue to hold on antiplatelet therapy due to acute anemia   Hypertension Continue blood pressure monitoring  Systolic blood pressure in the 140 mmHg range.   COPD (chronic obstructive pulmonary disease) (HCC) No signs of acute exacerbation  Continue chronic steroids and PJP prophylaxis.  Continue bronchodilator therapy   Type 2 diabetes mellitus with hyperlipidemia (HCC) Continue glucose cover and monitoring with insulin  sliding scale.  Continue statin   Vascular dementia (HCC) Continue memantine , mirtazapine ,         Subjective: patient with no chest pain or dyspnea, no abdominal pain, no nausea or vomiting, so far no recurrent melena.   Physical Exam: Vitals:   08/28/23 0700 08/28/23 0715 08/28/23 0818 08/28/23 1206  BP: (!) 151/58 (!) 168/64 (!) 171/71 (!) 145/61  Pulse: 66 63 71 65  Resp: (!) 21 19 18 18   Temp:  97.8 F (36.6 C) 97.7 F (36.5 C) 97.9 F (36.6 C)  TempSrc:  Oral    SpO2: 100% 100% 100% (!) 80%  Weight:      Height:       Neurology awake and alert ENT with mild pallor Cardiovascular with S1 and S2 present and regular  Respiratory with no rales or wheezing Abdomen with no distention and non tender, soft with bowel sounds present No lower extremity edema  Data Reviewed:   Family Communication: I spoke with patient's daughter at the bedside, we talked in detail about patient's condition, plan of care and prognosis and all questions were addressed.   Disposition: Status is: Observation The patient remains OBS appropriate and will d/c before 2 midnights.  Planned Discharge Destination:  Home    Author: Elidia Toribio Furnace, MD 08/28/2023 2:40 PM  For on call review [email or web address].

## 2023-08-28 NOTE — Assessment & Plan Note (Signed)
 Hyperkalemia and hyper P   Renal function with serum cr at 2.5 with K at 4,8 and serum bicarbonate at 29  Mg 2.2 and P 5.3  Continue furosemide  and follow up renal function and electrolytes in am.  Avoid hypotension and nephrotoxic medications

## 2023-08-29 DIAGNOSIS — I10 Essential (primary) hypertension: Secondary | ICD-10-CM | POA: Diagnosis not present

## 2023-08-29 DIAGNOSIS — K5521 Angiodysplasia of colon with hemorrhage: Secondary | ICD-10-CM | POA: Diagnosis not present

## 2023-08-29 DIAGNOSIS — K625 Hemorrhage of anus and rectum: Secondary | ICD-10-CM | POA: Diagnosis not present

## 2023-08-29 DIAGNOSIS — E785 Hyperlipidemia, unspecified: Secondary | ICD-10-CM | POA: Diagnosis not present

## 2023-08-29 DIAGNOSIS — I2510 Atherosclerotic heart disease of native coronary artery without angina pectoris: Secondary | ICD-10-CM | POA: Diagnosis not present

## 2023-08-29 DIAGNOSIS — D62 Acute posthemorrhagic anemia: Secondary | ICD-10-CM

## 2023-08-29 DIAGNOSIS — I5032 Chronic diastolic (congestive) heart failure: Secondary | ICD-10-CM | POA: Diagnosis not present

## 2023-08-29 DIAGNOSIS — K921 Melena: Principal | ICD-10-CM

## 2023-08-29 DIAGNOSIS — N184 Chronic kidney disease, stage 4 (severe): Secondary | ICD-10-CM | POA: Diagnosis not present

## 2023-08-29 DIAGNOSIS — K5730 Diverticulosis of large intestine without perforation or abscess without bleeding: Secondary | ICD-10-CM | POA: Diagnosis not present

## 2023-08-29 DIAGNOSIS — E1169 Type 2 diabetes mellitus with other specified complication: Secondary | ICD-10-CM | POA: Diagnosis not present

## 2023-08-29 DIAGNOSIS — K922 Gastrointestinal hemorrhage, unspecified: Secondary | ICD-10-CM | POA: Diagnosis not present

## 2023-08-29 DIAGNOSIS — J439 Emphysema, unspecified: Secondary | ICD-10-CM | POA: Diagnosis not present

## 2023-08-29 DIAGNOSIS — D500 Iron deficiency anemia secondary to blood loss (chronic): Secondary | ICD-10-CM | POA: Diagnosis not present

## 2023-08-29 LAB — CBC
HCT: 20.5 % — ABNORMAL LOW (ref 39.0–52.0)
HCT: 29.3 % — ABNORMAL LOW (ref 39.0–52.0)
Hemoglobin: 6.5 g/dL — CL (ref 13.0–17.0)
Hemoglobin: 9.2 g/dL — ABNORMAL LOW (ref 13.0–17.0)
MCH: 32.8 pg (ref 26.0–34.0)
MCH: 31.2 pg (ref 26.0–34.0)
MCHC: 31.7 g/dL (ref 30.0–36.0)
MCHC: 31.4 g/dL (ref 30.0–36.0)
MCV: 103.5 fL — ABNORMAL HIGH (ref 80.0–100.0)
MCV: 99.3 fL (ref 80.0–100.0)
Platelets: 164 K/uL (ref 150–400)
Platelets: 141 K/uL — ABNORMAL LOW (ref 150–400)
RBC: 1.98 MIL/uL — ABNORMAL LOW (ref 4.22–5.81)
RBC: 2.95 MIL/uL — ABNORMAL LOW (ref 4.22–5.81)
RDW: 15.3 % (ref 11.5–15.5)
RDW: 17.1 % — ABNORMAL HIGH (ref 11.5–15.5)
WBC: 7.2 K/uL (ref 4.0–10.5)
WBC: 8.2 K/uL (ref 4.0–10.5)
nRBC: 0.3 % — ABNORMAL HIGH (ref 0.0–0.2)
nRBC: 0.2 % (ref 0.0–0.2)

## 2023-08-29 LAB — BASIC METABOLIC PANEL WITH GFR
Anion gap: 9 (ref 5–15)
BUN: 49 mg/dL — ABNORMAL HIGH (ref 8–23)
CO2: 32 mmol/L (ref 22–32)
Calcium: 8.8 mg/dL — ABNORMAL LOW (ref 8.9–10.3)
Chloride: 102 mmol/L (ref 98–111)
Creatinine, Ser: 2.37 mg/dL — ABNORMAL HIGH (ref 0.61–1.24)
GFR, Estimated: 26 mL/min — ABNORMAL LOW (ref >60)
Glucose, Bld: 130 mg/dL — ABNORMAL HIGH (ref 70–99)
Potassium: 4.9 mmol/L (ref 3.5–5.1)
Sodium: 143 mmol/L (ref 135–145)

## 2023-08-29 LAB — GLUCOSE, CAPILLARY
Glucose-Capillary: 131 mg/dL — ABNORMAL HIGH (ref 70–99)
Glucose-Capillary: 232 mg/dL — ABNORMAL HIGH (ref 70–99)
Glucose-Capillary: 131 mg/dL — ABNORMAL HIGH (ref 70–99)
Glucose-Capillary: 147 mg/dL — ABNORMAL HIGH (ref 70–99)

## 2023-08-29 LAB — PREPARE RBC (CROSSMATCH)

## 2023-08-29 MED ORDER — SODIUM CHLORIDE 0.9% IV SOLUTION: Freq: Once | INTRAVENOUS | Status: AC

## 2023-08-29 NOTE — Progress Notes (Signed)
 Progress Note   Patient: Brian [name] FMW:[id number] DOB: 10-20-1931 DOA: 08/27/2023     0 DOS: the patient was seen and examined on 08/29/2023   Brief hospital course: Brian [name] was admitted to the hospital with the working diagnosis of rectal bleeding  89 yo male with the past medical history of coronary artery disease, COPD, T2DM, hypertension, hyperlipidemia, vascular dementia, and cecal AVMs who presented with rectal bleeding. Reported few days of melena and hematochezia with right lower quadrant abdominal pain, bit no nausea or vomiting, no hematemesis. On his initial physical examination his blood pressure was 153/61, HR 69, RR 24 and 02 saturation 98% Lungs with no wheezing or rhonchi, heart with S1 and S2 present and regular, abdomen with no distention and no lower extremity edema.   Na 142, K 5,6 Cl 102 bicarbonate 27 glucose 469, bun 69 cr 2,68, AST 16 ALT 15 BNP 576  Wbc 7,9 hgb 6,9 plt 151  Urine analysis SG 1,008, protein negative, leukocytes negative, hgb negative    Chest radiograph with hyperinflation and small right pleural effusion, small linear round opacity right  upper lobe. (Possible radiation changes, increased density, will need follow up CT chest.  Sternotomy wires in place.   CT chest abdomen and pelvis with small right pleural effusion, with compressive atelectasis, scattered small pulmonary nodules, moderate to advanced emphysema, probable small layering gallstone within the gallbladder. Prostate enlargement. Left colonic diverticulosis.   EKG 67 bpm, left axis deviation, normal intervals, qtc 445, sinus rhythm with no significant ST segment or T wave changes.   Patient had one unit PRBC transfusion on admission   07/10 worsening hgb, required second unit PRBC. Had recurrent black stools today.   Assessment and Plan: * Lower GI bleed Continue to have dark stools.  Sp 2 unit PRBC transfusion.  Follow up hgb is 9.2 after the second unit.   Continue with  pantoprazole  and clear liquid diet.  Follow up with GI recommendations.   Symptomatic anemia, with anemia of chronic disease.  Iron panel with serum iron at 179, TIBC 356, transferrin saturation 50 and ferritin 26   Chronic diastolic heart failure (HCC) No signs of acute exacerbation Continue diuresis with furosemide  40 mg po daily.   CKD (chronic kidney disease) stage 4, GFR 15-29 ml/min (HCC) Hyperkalemia and hyper P   Renal function today with serum cr at 2,37 with K at 4,9 and serum bicarbonate at 32 Na 143   Continue furosemide  and follow up renal function and electrolytes in am.  Avoid hypotension and nephrotoxic medications   CAD (coronary artery disease) No chest pain, continue to hold on antiplatelet therapy due to acute anemia   Hypertension Continue blood pressure monitoring  Systolic blood pressure in the 140 mmHg range.   COPD (chronic obstructive pulmonary disease) (HCC) No signs of acute exacerbation  Continue chronic steroids and PJP prophylaxis.  Continue bronchodilator therapy   Type 2 diabetes mellitus with hyperlipidemia (HCC) Continue glucose cover and monitoring with insulin  sliding scale.  Continue statin   Vascular dementia (HCC) Continue memantine , mirtazapine ,       Subjective: positive dark bowel movement today, he denies any abdominal pain, nausea or vomiting.   Physical Exam: Vitals:   08/29/23 0607 08/29/23 0622 08/29/23 0909 08/29/23 0936  BP: (!) 129/42 (!) 129/43 (!) 177/62   Pulse: 60 (!) 59 70   Resp: 16 16 20    Temp: 98 F (36.7 C) 98.2 F (36.8 C)    TempSrc: Oral Oral  SpO2: 95% 96% 98% 97%  Weight:      Height:       Neurology awake and alert ENT with mild pallor Cardiovascular with S1 and S2 present and regular with no gallops, rubs or murmurs Respiratory with no rales or wheezing no rhonchi Abdomen with soft and non tender, not distended No lower extremity edema  Data Reviewed:    Family Communication: no  family at the bedside   Disposition: Status is: Observation The patient remains OBS appropriate and will d/c before 2 midnights.  Planned Discharge Destination: Home     Author: Elidia Toribio Furnace, MD 08/29/2023 4:26 PM  For on call review [email or web address].

## 2023-08-29 NOTE — Plan of Care (Signed)
   Problem: Education: Goal: Ability to describe self-care measures that may prevent or decrease complications (Diabetes Survival Skills Education) will improve Outcome: Progressing Goal: Individualized Educational Video(s) Outcome: Progressing

## 2023-08-29 NOTE — Progress Notes (Addendum)
 Daily Progress Note  DOA: 08/27/2023 Hospital Day: 3   Cc: Lower GI bleeding  Brief History:  89 y.o. year old male with a medical history including but not limited to CKD 4, CAD status post CABG, AAA repair, hypertension, DM 2, COPD on chronic oxygen , cholelithiasis, prostamegaly, diverticulosis, adenomatous colon polyps, cecal AVMs.    Admitted with GI bleed, presumably lower and anemia.  See 7/9 GI consult  ASSESSMENT    89 year old male with painless GI bleeding, presumably lower with dark red stool on DRE .   Of note oral iron is on his home med list ( ? Taking).  Diverticular hemorrhage versus cecal AVM bleeding .  >>TODAY: Patient having trouble remembering if he had any blood in his bowel movement today.  I spoke to daughter [name] over the phone who helped him in the bathroom this morning.  Per [name] he had 1 formed BM with blood today.  Stool was still in the bedside commode.  It was a small very dark stool with maroon tint  Acute on chronic anemia 2/2 to above.  Macrocytosis   History of iron deficiency Presenting hemoglobin 6.9, down from 10 one year ago.  Despite macrocytosis, he is most likely still iron deficient with a ferritin of 26 ( not clear if he has been taking oral iron at home) .  Normal folate and normal B12 levels >>TODAY:  Hemoglobin responded to 1 unit of PRBCs yesterday with rise in hemoglobin to 8.1 but overnight hemoglobin declined to 6.5.  He has since received another unit of PRBCs.  He feels fine, is hemodynamically stable but did have another dark/reddish BM today.  Hopefully darkening of the stool represents old blood in the colon but the recurrent decline in hemoglobin is concerning   History of adenomatous colon polyps.  No polyps on colonoscopy in February 2024   Principal Problem:   Lower GI bleed Active Problems:   CAD (coronary artery disease)   Hypertension   Type 2 diabetes mellitus with hyperlipidemia (HCC)   COPD (chronic  obstructive pulmonary disease) (HCC)   CKD (chronic kidney disease) stage 4, GFR 15-29 ml/min (HCC)   Chronic diastolic heart failure (HCC)   Vascular dementia (HCC)   PLAN   --Second unit of blood completed earlier this a.m.  Will repeat CBC now to see response especially since he has had another dark reddish stool .  -- Continue empiric twice daily PPI --am CBC  Subjective   Feels okay.  Says he has a bowel movement today but does not know if it contained blood.  Daughter [name] helped him in the bathroom.    Objective   GI Studies:    Recent Labs    08/27/23 2154 08/28/23 0500 08/29/23 0436  WBC 7.9 9.0 7.2  HGB 6.9* 8.1* 6.5*  HCT 23.4* 26.7* 20.5*  MCV 109.9* 103.9* 103.5*  PLT 151 157 164   Recent Labs    08/28/23 0500  FOLATE 20.7  VITAMINB12 1,087*  FERRITIN 26  TIBC 356  IRONPCTSAT 50*   Recent Labs    08/27/23 2154 08/28/23 0500 08/29/23 0925  NA 142 142 143  K 5.6* 4.8 4.9  CL 102 103 102  CO2 27 29 32  GLUCOSE 469* 147* 130*  BUN 59* 58* 49*  CREATININE 2.68* 2.55* 2.37*  CALCIUM 8.5* 8.5* 8.8*   Recent Labs    08/27/23 2154  PROT 5.3*  ALBUMIN 3.2*  AST 16  ALT 15  ALKPHOS  43  BILITOT 0.6      Imaging:  ECHOCARDIOGRAM COMPLETE    ECHOCARDIOGRAM REPORT       Patient Name:   Brian [name] Date of Exam: 08/28/2023 Medical Rec #:  [id number]       Height:       71.0 in Accession #:    [id number]      Weight:       167.5 lb Date of Birth:  12-22-1931      BSA:          1.956 m Patient Age:    89 years        BP:           145/61 mmHg Patient Gender: M               HR:           68 bpm. Exam Location:  Inpatient  Procedure: 2D Echo, Cardiac Doppler and Color Doppler (Both Spectral and Color            Flow Doppler were utilized during procedure).  Indications:    I50.40* Unspecified combined systolic (congestive) and diastolic                 (congestive) heart failure   History:        Patient has prior history of  Echocardiogram examinations, most                 recent 09/13/2021. CAD, Abnormal ECG, Arrythmias:SVT. AV block;                 Risk Factors:Hypertension and Diabetes.   Sonographer:    Ellouise Mose RDCS Sonographer#2:  Juliene Rucks Referring Phys: [id number] [facility]    Sonographer Comments: Technically difficult study due to poor echo windows. Patient had trouble staying in left decubitus position. IMPRESSIONS   1. Left ventricular ejection fraction, by estimation, is 60 to 65%. The left ventricle has normal function. The left ventricle has no regional wall motion abnormalities. There is severe left ventricular hypertrophy of the basal-septal segment. Left  ventricular diastolic parameters were normal.  2. Right ventricular systolic function is normal. The right ventricular size is normal.  3. The mitral valve is degenerative. Mild mitral valve regurgitation. No evidence of mitral stenosis. There is mild late systolic prolapse of posterior leaflet of the mitral valve.  4. The aortic valve is tricuspid. Aortic valve regurgitation is trivial. Mild aortic valve stenosis.  FINDINGS  Left Ventricle: Left ventricular ejection fraction, by estimation, is 60 to 65%. The left ventricle has normal function. The left ventricle has no regional wall motion abnormalities. The left ventricular internal cavity size was normal in size. There is  severe left ventricular hypertrophy of the basal-septal segment. Left ventricular diastolic parameters were normal.  Right Ventricle: The right ventricular size is normal. Right ventricular systolic function is normal.  Left Atrium: Left atrial size was normal in size.  Right Atrium: Right atrial size was normal in size.  Pericardium: There is no evidence of pericardial effusion.  Mitral Valve: The mitral valve is degenerative in appearance. There is mild late systolic prolapse of posterior leaflet of the mitral valve. Mild mitral annular calcification. Mild  mitral valve regurgitation. No evidence of mitral valve stenosis. MV peak  gradient, 6.8 mmHg. The mean mitral valve gradient is 2.5 mmHg.  Tricuspid Valve: The tricuspid valve is grossly normal. Tricuspid valve regurgitation is mild . No evidence of tricuspid stenosis.  Aortic Valve: The aortic valve is tricuspid. Aortic valve regurgitation is trivial. Mild aortic stenosis is present. Aortic valve mean gradient measures 10.5 mmHg. Aortic valve peak gradient measures 20.8 mmHg.  Pulmonic Valve: The pulmonic valve was grossly normal. Pulmonic valve regurgitation is trivial. No evidence of pulmonic stenosis.  Aorta: The aortic root and ascending aorta are structurally normal, with no evidence of dilitation.  IAS/Shunts: No atrial level shunt detected by color flow Doppler.    LEFT VENTRICLE PLAX 2D LVIDd:         4.50 cm      Diastology LVIDs:         3.20 cm      LV e' medial:    8.08 cm/s LV PW:         1.10 cm      LV E/e' medial:  11.9 LV IVS:        1.30 cm      LV e' lateral:   10.40 cm/s LVOT diam:     2.00 cm      LV E/e' lateral: 9.3 LV SV:         64 LV SV Index:   33 LVOT Area:     3.14 cm   LV Volumes (MOD) LV vol d, MOD A2C: 101.0 ml LV vol d, MOD A4C: 111.1 ml LV vol s, MOD A2C: 33.4 ml LV vol s, MOD A4C: 46.2 ml LV SV MOD A2C:     67.6 ml LV SV MOD A4C:     111.1 ml LV SV MOD BP:      68.4 ml  RIGHT VENTRICLE             IVC RV S prime:     10.60 cm/s  IVC diam: 1.60 cm TAPSE (M-mode): 1.5 cm  LEFT ATRIUM             Index        RIGHT ATRIUM           Index LA diam:        2.30 cm 1.18 cm/m   RA Area:     17.80 cm LA Vol (A2C):   28.0 ml 14.32 ml/m  RA Volume:   46.70 ml  23.88 ml/m LA Vol (A4C):   13.3 ml 6.80 ml/m LA Biplane Vol: 19.9 ml 10.18 ml/m  AORTIC VALVE AV Area (Vmax):    1.45 cm AV Area (Vmean):   1.35 cm AV Area (VTI):     1.32 cm AV Vmax:           228.00 cm/s AV Vmean:          153.500 cm/s AV VTI:            0.484 m AV Peak  Grad:      20.8 mmHg AV Mean Grad:      10.5 mmHg LVOT Vmax:         105.00 cm/s LVOT Vmean:        66.200 cm/s LVOT VTI:          0.204 m LVOT/AV VTI ratio: 0.42   AORTA Ao Root diam: 3.30 cm Ao Asc diam:  3.55 cm  MITRAL VALVE MV Area (PHT): 2.46 cm     SHUNTS MV Area VTI:   2.09 cm     Systemic VTI:  0.20 m MV Peak grad:  6.8 mmHg     Systemic Diam: 2.00 cm MV Mean grad:  2.5 mmHg MV Vmax:  1.31 m/s MV Vmean:      74.4 cm/s MV Decel Time: 309 msec MV E velocity: 96.40 cm/s MV A velocity: 129.00 cm/s MV E/A ratio:  0.75  Redell Shallow MD Electronically signed by Redell Shallow MD Signature Date/Time: 08/28/2023/4:32:02 PM      Final   CT CHEST ABDOMEN PELVIS WO CONTRAST CLINICAL DATA:  Groin lymphadenopathy. Right red blood per rectum. Shortness of breath.  EXAM: CT CHEST, ABDOMEN AND PELVIS WITHOUT CONTRAST  TECHNIQUE: Multidetector CT imaging of the chest, abdomen and pelvis was performed following the standard protocol without IV contrast.  RADIATION DOSE REDUCTION: This exam was performed according to the departmental dose-optimization program which includes automated exposure control, adjustment of the mA and/or kV according to patient size and/or use of iterative reconstruction technique.  COMPARISON:  04/26/2023  FINDINGS: CT CHEST FINDINGS  Cardiovascular: Heart is normal size. Aorta is normal caliber. Prior CABG. Diffuse aortic atherosclerosis.  Mediastinum/Nodes: No mediastinal, hilar, or axillary adenopathy. Trachea and esophagus are unremarkable. Thyroid  unremarkable.  Lungs/Pleura: Moderate to advanced emphysema. Small right pleural effusion, similar to prior study. Compressive atelectasis in the right lower lobe. Calcified pleural plaque at the right lung base is unchanged. Left upper lobe pulmonary nodule on image 40 measures 4 mm compared to 5 mm previously. Postradiation bandlike scarring in the right upper lobe is unchanged.  Lingular nodule measures 4 mm compared to 6 mm previously. No new or enlarging pulmonary nodules.  Musculoskeletal: Chest wall soft tissues are unremarkable. No acute bony abnormality.  CT ABDOMEN PELVIS FINDINGS  Hepatobiliary: Gallbladder is contracted. Small layering gallstones likely present. No biliary ductal dilatation or focal hepatic abnormality.  Pancreas: No focal abnormality or ductal dilatation.  Spleen: No focal abnormality.  Normal size.  Adrenals/Urinary Tract: Renovascular calcifications. No definite urinary tract calcifications or hydronephrosis. No focal renal or adrenal abnormality. Urinary bladder unremarkable.  Stomach/Bowel: Normal appendix. Left colonic diverticulosis. No active diverticulitis. Stomach and small bowel decompressed, unremarkable. No obstruction or inflammatory process.  Vascular/Lymphatic: Status post stent graft repair of abdominal aortic aneurysm. Maximum aneurysm sac size 2.8 cm. No adenopathy.  Reproductive: Mildly enlarged prostate  Other: No free fluid or free air.  Musculoskeletal: No acute bony abnormality. Degenerative disc and facet disease in the lumbar spine.  IMPRESSION: Small right pleural effusion with compressive atelectasis in the right lower lobe, similar prior study.  Scattered small pulmonary nodules, unchanged. No new or enlarging pulmonary nodules.  Moderate to advanced emphysema.  Probable small layering gallstones within the gallbladder which is contracted. No CT evidence of acute cholecystitis.  Aortic atherosclerosis.  Status post stent graft repair of AAA.  Prostate enlargement.  Left colonic diverticulosis.  No acute findings in the chest, abdomen or pelvis.  Electronically Signed   By: Franky Crease M.D.   On: 08/28/2023 00:33     Scheduled inpatient medications:   cyanocobalamin   1,000 mcg Oral Daily   escitalopram   20 mg Oral Daily   furosemide   40 mg Oral Daily   insulin  aspart  0-5  Units Subcutaneous QHS   insulin  aspart  0-6 Units Subcutaneous TID WC   magnesium  oxide  400 mg Oral QHS   memantine   5 mg Oral BID   mirtazapine   15 mg Oral QHS   pantoprazole  (PROTONIX ) IV  40 mg Intravenous Q12H   predniSONE   10 mg Oral BID WC   simvastatin   40 mg Oral Daily   sodium chloride  flush  3 mL Intravenous Q12H   sulfamethoxazole -trimethoprim   1 tablet Oral Once per day on Monday Wednesday Friday   umeclidinium-vilanterol  1 puff Inhalation Daily   Continuous inpatient infusions:  PRN inpatient medications: ipratropium-albuterol   Vital signs in last 24 hours: Temp:  [97.5 F (36.4 C)-98.2 F (36.8 C)] 98.2 F (36.8 C) (07/10 0622) Pulse Rate:  [59-74] 70 (07/10 0909) Resp:  [16-20] 20 (07/10 0909) BP: (129-177)/(42-72) 177/62 (07/10 0909) SpO2:  [94 %-100 %] 97 % (07/10 0936) Last BM Date : 08/27/23 (Prior to admission.)  Intake/Output Summary (Last 24 hours) at 08/29/2023 1514 Last data filed at 08/29/2023 0909 Gross per 24 hour  Intake 618.16 ml  Output 850 ml  Net -231.84 ml    Intake/Output from previous day: 07/09 0701 - 07/10 0700 In: 269 [P.O.:266; I.V.:3] Out: 850 [Urine:850] Intake/Output this shift: Total I/O In: 379.2 [Blood:379.2] Out: -    Physical Exam:  General: Alert male in NAD Heart:  Regular rate and rhythm.  Pulmonary: Normal respiratory effort Abdomen: Soft, nondistended, nontender. Normal bowel sounds. Extremities: No lower extremity edema  Neurologic: Alert and oriented Psych: Pleasant. Cooperative     LOS: 0 days   Vina Dasen ,NP 08/29/2023, 3:14 PM   I have taken an interval history, thoroughly reviewed the chart and examined the patient. I agree with the Advanced Practitioner's note, impression and recommendations, and have recorded additional findings, impressions and recommendations below. I performed a substantive portion of this encounter (>50% time spent), including a complete performance of the medical  decision making.  My additional thoughts are as follows:  Still having bleeding, not clear if upper or lower source.  Given his hemodynamic stability, blood that was witnessed earlier today and the rate of blood loss, suspect it may be a slow lower GI bleed such as diverticular or recurrence of AVMs.  He may yet need endoscopic testing, though he is high risk for that with his respiratory condition.  Getting transfused today, we will see how things progress in the next 24 to 48 hours and decide how to proceed.  May be best to do a tagged RBC scan although it is not yet clear if his bleeding is fast enough to be picked up on that.    [name] Office:[phone]

## 2023-08-29 NOTE — Progress Notes (Signed)
 [facility] night cross cover note:   I was notified by the patient's RN that the patient's hemoglobin this morning of 6.5, which is relative to his recent prior hemoglobin of 8.1 yesterday morning.  Leading up to this value of 8.1, patient's initial hemoglobin was 6.9, prompting transfusion of 1 unit PRBC.   No new symptoms reported at this time, and vital signs appear stable, including heart rates in the 60s as well as systolic blood pressures in the 140s mmHg.  I subsequently ordered transfusion of 1 unit PRBC over 4 hours along with orders for repeat H&H to be checked following completion of transfusion of this 1 unit PRBC.     Eva Pore, DO Hospitalist

## 2023-08-29 NOTE — Progress Notes (Signed)
 Critical lab for Hbg in 6.5. Provider was notified. Administered one unit of RBC to the patient, tolerating well. Will continue to monitor.

## 2023-08-29 NOTE — Plan of Care (Signed)

## 2023-08-29 NOTE — Evaluation (Signed)
 Physical Therapy Evaluation and Discharge Patient Details Name: Brian [name] MRN: [id number] DOB: [birth year] Today's Date: 08/29/2023  History of Present Illness  Pt is a 89 y.o. M who presents 08/27/2023 with lower GIB. Chest radiograph with hyperinflation and small right pleural effusion, small linear round opacity right  upper lobe. Significant PMH: coronary artery disease, COPD, T2DM, hypertension, hyperlipidemia, vascular dementia, and cecal AVMs.  Clinical Impression  Patient evaluated by Physical Therapy with no further acute PT needs identified. Pt reporting he is at his functional baseline. PTA, pt lives with his daughter and is a household ambulator with a Occupational hygienist. Pt is independent with ADL's and denies recent falls. Pt reports he is on 6L O2 at home and typical sats are 90% (has several pulse oximeters). Pt received sitting on edge of bed and able to perform LB dressing. Pt ambulating 75 ft with a RW modI, Spo2 88-90% on 6L O2, HR 70's. All education has been completed and the patient has no further questions. No follow-up Physical Therapy or equipment needs. PT is signing off. Thank you for this referral.       If plan is discharge home, recommend the following: Assistance with cooking/housework   Can travel by private vehicle        Equipment Recommendations None recommended by PT  Recommendations for Other Services       Functional Status Assessment Patient has not had a recent decline in their functional status     Precautions / Restrictions Precautions Precautions: Fall;Other (comment) Precaution/Restrictions Comments: Watch O2 Restrictions Weight Bearing Restrictions Per Provider Order: No      Mobility  Bed Mobility               General bed mobility comments: Sitting EOB upon arrival    Transfers Overall transfer level: Modified independent Equipment used: Rolling walker (2 wheels)                    Ambulation/Gait Ambulation/Gait  assistance: Modified independent (Device/Increase time) Gait Distance (Feet): 75 Feet Assistive device: Rolling walker (2 wheels) Gait Pattern/deviations: Step-through pattern, Decreased stride length       General Gait Details: Slow and steady pace  Stairs            Wheelchair Mobility     Tilt Bed    Modified Rankin (Stroke Patients Only)       Balance Overall balance assessment: Mild deficits observed, not formally tested                                           Pertinent Vitals/Pain Pain Assessment Pain Assessment: No/denies pain    Home Living Family/patient expects to be discharged to:: Private residence Living Arrangements: Children Available Help at Discharge: Family Type of Home: House Home Access: Stairs to enter   Secretary/administrator of Steps: 2   Home Layout: Able to live on main level with bedroom/bathroom Home Equipment: Rollator (4 wheels);Cane - single point      Prior Function Prior Level of Function : Independent/Modified Independent;Driving             Mobility Comments: Uses Rollator ADLs Comments: Daughter can assist with IADL's     Extremity/Trunk Assessment   Upper Extremity Assessment Upper Extremity Assessment: Overall WFL for tasks assessed    Lower Extremity Assessment Lower Extremity Assessment: Overall WFL for tasks assessed  Communication   Communication Communication: No apparent difficulties    Cognition Arousal: Alert Behavior During Therapy: WFL for tasks assessed/performed   PT - Cognitive impairments: No apparent impairments                         Following commands: Intact       Cueing Cueing Techniques: Verbal cues     General Comments      Exercises     Assessment/Plan    PT Assessment Patient does not need any further PT services  PT Problem List         PT Treatment Interventions      PT Goals (Current goals can be found in the Care Plan  section)  Acute Rehab PT Goals Patient Stated Goal: go home PT Goal Formulation: All assessment and education complete, DC therapy    Frequency       Co-evaluation               AM-PAC PT 6 Clicks Mobility  Outcome Measure Help needed turning from your back to your side while in a flat bed without using bedrails?: None Help needed moving from lying on your back to sitting on the side of a flat bed without using bedrails?: None Help needed moving to and from a bed to a chair (including a wheelchair)?: None Help needed standing up from a chair using your arms (e.g., wheelchair or bedside chair)?: None Help needed to walk in hospital room?: None Help needed climbing 3-5 steps with a railing? : A Little 6 Click Score: 23    End of Session Equipment Utilized During Treatment: Gait belt;Oxygen  Activity Tolerance: Patient tolerated treatment well Patient left: in bed;with call bell/phone within reach Nurse Communication: Mobility status PT Visit Diagnosis: Difficulty in walking, not elsewhere classified (R26.2)    Time: 8848-8789 PT Time Calculation (min) (ACUTE ONLY): 19 min   Charges:   PT Evaluation $PT Eval Low Complexity: 1 Low   PT General Charges $$ ACUTE PT VISIT: 1 Visit         Brian [name], PT, DPT Acute Rehabilitation Services Office [phone]   Brian [name] 08/29/2023, 12:18 PM

## 2023-08-29 NOTE — H&P (View-Only) (Signed)
 Daily Progress Note  DOA: 08/27/2023 Hospital Day: 3   Cc: Lower GI bleeding  Brief History:  89 y.o. year old male with a medical history including but not limited to CKD 4, CAD status post CABG, AAA repair, hypertension, DM 2, COPD on chronic oxygen , cholelithiasis, prostamegaly, diverticulosis, adenomatous colon polyps, cecal AVMs.    Admitted with GI bleed, presumably lower and anemia.  See 7/9 GI consult  ASSESSMENT    89 year old male with painless GI bleeding, presumably lower with dark red stool on DRE .   Of note oral iron is on his home med list ( ? Taking).  Diverticular hemorrhage versus cecal AVM bleeding .  >>TODAY: Patient having trouble remembering if he had any blood in his bowel movement today.  I spoke to daughter Brian [name] over the phone who helped him in the bathroom this morning.  Per Brian [name] he had 1 formed BM with blood today.  Stool was still in the bedside commode.  It was a small very dark stool with maroon tint  Acute on chronic anemia 2/2 to above.  Macrocytosis   History of iron deficiency Presenting hemoglobin 6.9, down from 10 one year ago.  Despite macrocytosis, he is most likely still iron deficient with a ferritin of 26 ( not clear if he has been taking oral iron at home) .  Normal folate and normal B12 levels >>TODAY:  Hemoglobin responded to 1 unit of PRBCs yesterday with rise in hemoglobin to 8.1 but overnight hemoglobin declined to 6.5.  He has since received another unit of PRBCs.  He feels fine, is hemodynamically stable but did have another dark/reddish BM today.  Hopefully darkening of the stool represents old blood in the colon but the recurrent decline in hemoglobin is concerning   History of adenomatous colon polyps.  No polyps on colonoscopy in February 2024   Principal Problem:   Lower GI bleed Active Problems:   CAD (coronary artery disease)   Hypertension   Type 2 diabetes mellitus with hyperlipidemia (HCC)   COPD (chronic  obstructive pulmonary disease) (HCC)   CKD (chronic kidney disease) stage 4, GFR 15-29 ml/min (HCC)   Chronic diastolic heart failure (HCC)   Vascular dementia (HCC)   PLAN   --Second unit of blood completed earlier this a.m.  Will repeat CBC now to see response especially since he has had another dark reddish stool .  -- Continue empiric twice daily PPI --am CBC  Subjective   Feels okay.  Says he has a bowel movement today but does not know if it contained blood.  Daughter Brian [name] helped him in the bathroom.    Objective   GI Studies:    Recent Labs    08/27/23 2154 08/28/23 0500 08/29/23 0436  WBC 7.9 9.0 7.2  HGB 6.9* 8.1* 6.5*  HCT 23.4* 26.7* 20.5*  MCV 109.9* 103.9* 103.5*  PLT 151 157 164   Recent Labs    08/28/23 0500  FOLATE 20.7  VITAMINB12 1,087*  FERRITIN 26  TIBC 356  IRONPCTSAT 50*   Recent Labs    08/27/23 2154 08/28/23 0500 08/29/23 0925  NA 142 142 143  K 5.6* 4.8 4.9  CL 102 103 102  CO2 27 29 32  GLUCOSE 469* 147* 130*  BUN 59* 58* 49*  CREATININE 2.68* 2.55* 2.37*  CALCIUM 8.5* 8.5* 8.8*   Recent Labs    08/27/23 2154  PROT 5.3*  ALBUMIN 3.2*  AST 16  ALT 15  ALKPHOS  43  BILITOT 0.6      Imaging:  ECHOCARDIOGRAM COMPLETE    ECHOCARDIOGRAM REPORT       Patient Name:   Brian [name] Date of Exam: 08/28/2023 Medical Rec #:  [id number]       Height:       71.0 in Accession #:    [id number]      Weight:       167.5 lb Date of Birth:  01/01/1932      BSA:          1.956 m Patient Age:    89 years        BP:           145/61 mmHg Patient Gender: M               HR:           68 bpm. Exam Location:  Inpatient  Procedure: 2D Echo, Cardiac Doppler and Color Doppler (Both Spectral and Color            Flow Doppler were utilized during procedure).  Indications:    I50.40* Unspecified combined systolic (congestive) and diastolic                 (congestive) heart failure   History:        Patient has prior history of  Echocardiogram examinations, most                 recent 09/13/2021. CAD, Abnormal ECG, Arrythmias:SVT. AV block;                 Risk Factors:Hypertension and Diabetes.   Sonographer:    Ellouise Mose RDCS Sonographer#2:  Juliene Rucks Referring Phys: [id number] [facility]    Sonographer Comments: Technically difficult study due to poor echo windows. Patient had trouble staying in left decubitus position. IMPRESSIONS   1. Left ventricular ejection fraction, by estimation, is 60 to 65%. The left ventricle has normal function. The left ventricle has no regional wall motion abnormalities. There is severe left ventricular hypertrophy of the basal-septal segment. Left  ventricular diastolic parameters were normal.  2. Right ventricular systolic function is normal. The right ventricular size is normal.  3. The mitral valve is degenerative. Mild mitral valve regurgitation. No evidence of mitral stenosis. There is mild late systolic prolapse of posterior leaflet of the mitral valve.  4. The aortic valve is tricuspid. Aortic valve regurgitation is trivial. Mild aortic valve stenosis.  FINDINGS  Left Ventricle: Left ventricular ejection fraction, by estimation, is 60 to 65%. The left ventricle has normal function. The left ventricle has no regional wall motion abnormalities. The left ventricular internal cavity size was normal in size. There is  severe left ventricular hypertrophy of the basal-septal segment. Left ventricular diastolic parameters were normal.  Right Ventricle: The right ventricular size is normal. Right ventricular systolic function is normal.  Left Atrium: Left atrial size was normal in size.  Right Atrium: Right atrial size was normal in size.  Pericardium: There is no evidence of pericardial effusion.  Mitral Valve: The mitral valve is degenerative in appearance. There is mild late systolic prolapse of posterior leaflet of the mitral valve. Mild mitral annular calcification. Mild  mitral valve regurgitation. No evidence of mitral valve stenosis. MV peak  gradient, 6.8 mmHg. The mean mitral valve gradient is 2.5 mmHg.  Tricuspid Valve: The tricuspid valve is grossly normal. Tricuspid valve regurgitation is mild . No evidence of tricuspid stenosis.  Aortic Valve: The aortic valve is tricuspid. Aortic valve regurgitation is trivial. Mild aortic stenosis is present. Aortic valve mean gradient measures 10.5 mmHg. Aortic valve peak gradient measures 20.8 mmHg.  Pulmonic Valve: The pulmonic valve was grossly normal. Pulmonic valve regurgitation is trivial. No evidence of pulmonic stenosis.  Aorta: The aortic root and ascending aorta are structurally normal, with no evidence of dilitation.  IAS/Shunts: No atrial level shunt detected by color flow Doppler.    LEFT VENTRICLE PLAX 2D LVIDd:         4.50 cm      Diastology LVIDs:         3.20 cm      LV e' medial:    8.08 cm/s LV PW:         1.10 cm      LV E/e' medial:  11.9 LV IVS:        1.30 cm      LV e' lateral:   10.40 cm/s LVOT diam:     2.00 cm      LV E/e' lateral: 9.3 LV SV:         64 LV SV Index:   33 LVOT Area:     3.14 cm   LV Volumes (MOD) LV vol d, MOD A2C: 101.0 ml LV vol d, MOD A4C: 111.1 ml LV vol s, MOD A2C: 33.4 ml LV vol s, MOD A4C: 46.2 ml LV SV MOD A2C:     67.6 ml LV SV MOD A4C:     111.1 ml LV SV MOD BP:      68.4 ml  RIGHT VENTRICLE             IVC RV S prime:     10.60 cm/s  IVC diam: 1.60 cm TAPSE (M-mode): 1.5 cm  LEFT ATRIUM             Index        RIGHT ATRIUM           Index LA diam:        2.30 cm 1.18 cm/m   RA Area:     17.80 cm LA Vol (A2C):   28.0 ml 14.32 ml/m  RA Volume:   46.70 ml  23.88 ml/m LA Vol (A4C):   13.3 ml 6.80 ml/m LA Biplane Vol: 19.9 ml 10.18 ml/m  AORTIC VALVE AV Area (Vmax):    1.45 cm AV Area (Vmean):   1.35 cm AV Area (VTI):     1.32 cm AV Vmax:           228.00 cm/s AV Vmean:          153.500 cm/s AV VTI:            0.484 m AV Peak  Grad:      20.8 mmHg AV Mean Grad:      10.5 mmHg LVOT Vmax:         105.00 cm/s LVOT Vmean:        66.200 cm/s LVOT VTI:          0.204 m LVOT/AV VTI ratio: 0.42   AORTA Ao Root diam: 3.30 cm Ao Asc diam:  3.55 cm  MITRAL VALVE MV Area (PHT): 2.46 cm     SHUNTS MV Area VTI:   2.09 cm     Systemic VTI:  0.20 m MV Peak grad:  6.8 mmHg     Systemic Diam: 2.00 cm MV Mean grad:  2.5 mmHg MV Vmax:  1.31 m/s MV Vmean:      74.4 cm/s MV Decel Time: 309 msec MV E velocity: 96.40 cm/s MV A velocity: 129.00 cm/s MV E/A ratio:  0.75  Redell Shallow MD Electronically signed by Redell Shallow MD Signature Date/Time: 08/28/2023/4:32:02 PM      Final   CT CHEST ABDOMEN PELVIS WO CONTRAST CLINICAL DATA:  Groin lymphadenopathy. Right red blood per rectum. Shortness of breath.  EXAM: CT CHEST, ABDOMEN AND PELVIS WITHOUT CONTRAST  TECHNIQUE: Multidetector CT imaging of the chest, abdomen and pelvis was performed following the standard protocol without IV contrast.  RADIATION DOSE REDUCTION: This exam was performed according to the departmental dose-optimization program which includes automated exposure control, adjustment of the mA and/or kV according to patient size and/or use of iterative reconstruction technique.  COMPARISON:  04/26/2023  FINDINGS: CT CHEST FINDINGS  Cardiovascular: Heart is normal size. Aorta is normal caliber. Prior CABG. Diffuse aortic atherosclerosis.  Mediastinum/Nodes: No mediastinal, hilar, or axillary adenopathy. Trachea and esophagus are unremarkable. Thyroid  unremarkable.  Lungs/Pleura: Moderate to advanced emphysema. Small right pleural effusion, similar to prior study. Compressive atelectasis in the right lower lobe. Calcified pleural plaque at the right lung base is unchanged. Left upper lobe pulmonary nodule on image 40 measures 4 mm compared to 5 mm previously. Postradiation bandlike scarring in the right upper lobe is unchanged.  Lingular nodule measures 4 mm compared to 6 mm previously. No new or enlarging pulmonary nodules.  Musculoskeletal: Chest wall soft tissues are unremarkable. No acute bony abnormality.  CT ABDOMEN PELVIS FINDINGS  Hepatobiliary: Gallbladder is contracted. Small layering gallstones likely present. No biliary ductal dilatation or focal hepatic abnormality.  Pancreas: No focal abnormality or ductal dilatation.  Spleen: No focal abnormality.  Normal size.  Adrenals/Urinary Tract: Renovascular calcifications. No definite urinary tract calcifications or hydronephrosis. No focal renal or adrenal abnormality. Urinary bladder unremarkable.  Stomach/Bowel: Normal appendix. Left colonic diverticulosis. No active diverticulitis. Stomach and small bowel decompressed, unremarkable. No obstruction or inflammatory process.  Vascular/Lymphatic: Status post stent graft repair of abdominal aortic aneurysm. Maximum aneurysm sac size 2.8 cm. No adenopathy.  Reproductive: Mildly enlarged prostate  Other: No free fluid or free air.  Musculoskeletal: No acute bony abnormality. Degenerative disc and facet disease in the lumbar spine.  IMPRESSION: Small right pleural effusion with compressive atelectasis in the right lower lobe, similar prior study.  Scattered small pulmonary nodules, unchanged. No new or enlarging pulmonary nodules.  Moderate to advanced emphysema.  Probable small layering gallstones within the gallbladder which is contracted. No CT evidence of acute cholecystitis.  Aortic atherosclerosis.  Status post stent graft repair of AAA.  Prostate enlargement.  Left colonic diverticulosis.  No acute findings in the chest, abdomen or pelvis.  Electronically Signed   By: Franky Crease M.D.   On: 08/28/2023 00:33     Scheduled inpatient medications:   cyanocobalamin   1,000 mcg Oral Daily   escitalopram   20 mg Oral Daily   furosemide   40 mg Oral Daily   insulin  aspart  0-5  Units Subcutaneous QHS   insulin  aspart  0-6 Units Subcutaneous TID WC   magnesium  oxide  400 mg Oral QHS   memantine   5 mg Oral BID   mirtazapine   15 mg Oral QHS   pantoprazole  (PROTONIX ) IV  40 mg Intravenous Q12H   predniSONE   10 mg Oral BID WC   simvastatin   40 mg Oral Daily   sodium chloride  flush  3 mL Intravenous Q12H   sulfamethoxazole -trimethoprim   1 tablet Oral Once per day on Monday Wednesday Friday   umeclidinium-vilanterol  1 puff Inhalation Daily   Continuous inpatient infusions:  PRN inpatient medications: ipratropium-albuterol   Vital signs in last 24 hours: Temp:  [97.5 F (36.4 C)-98.2 F (36.8 C)] 98.2 F (36.8 C) (07/10 0622) Pulse Rate:  [59-74] 70 (07/10 0909) Resp:  [16-20] 20 (07/10 0909) BP: (129-177)/(42-72) 177/62 (07/10 0909) SpO2:  [94 %-100 %] 97 % (07/10 0936) Last BM Date : 08/27/23 (Prior to admission.)  Intake/Output Summary (Last 24 hours) at 08/29/2023 1514 Last data filed at 08/29/2023 0909 Gross per 24 hour  Intake 618.16 ml  Output 850 ml  Net -231.84 ml    Intake/Output from previous day: 07/09 0701 - 07/10 0700 In: 269 [P.O.:266; I.V.:3] Out: 850 [Urine:850] Intake/Output this shift: Total I/O In: 379.2 [Blood:379.2] Out: -    Physical Exam:  General: Alert male in NAD Heart:  Regular rate and rhythm.  Pulmonary: Normal respiratory effort Abdomen: Soft, nondistended, nontender. Normal bowel sounds. Extremities: No lower extremity edema  Neurologic: Alert and oriented Psych: Pleasant. Cooperative     LOS: 0 days   Vina Dasen ,NP 08/29/2023, 3:14 PM   I have taken an interval history, thoroughly reviewed the chart and examined the patient. I agree with the Advanced Practitioner's note, impression and recommendations, and have recorded additional findings, impressions and recommendations below. I performed a substantive portion of this encounter (>50% time spent), including a complete performance of the medical  decision making.  My additional thoughts are as follows:  Still having bleeding, not clear if upper or lower source.  Given his hemodynamic stability, blood that was witnessed earlier today and the rate of blood loss, suspect it may be a slow lower GI bleed such as diverticular or recurrence of AVMs.  He may yet need endoscopic testing, though he is high risk for that with his respiratory condition.  Getting transfused today, we will see how things progress in the next 24 to 48 hours and decide how to proceed.  May be best to do a tagged RBC scan although it is not yet clear if his bleeding is fast enough to be picked up on that.    [name] Office:[phone]

## 2023-08-30 ENCOUNTER — Observation Stay (HOSPITAL_COMMUNITY)

## 2023-08-30 DIAGNOSIS — D696 Thrombocytopenia, unspecified: Secondary | ICD-10-CM | POA: Diagnosis not present

## 2023-08-30 DIAGNOSIS — Z860100 Personal history of colon polyps, unspecified: Secondary | ICD-10-CM

## 2023-08-30 DIAGNOSIS — K922 Gastrointestinal hemorrhage, unspecified: Secondary | ICD-10-CM | POA: Diagnosis not present

## 2023-08-30 DIAGNOSIS — D500 Iron deficiency anemia secondary to blood loss (chronic): Secondary | ICD-10-CM | POA: Diagnosis not present

## 2023-08-30 DIAGNOSIS — K5521 Angiodysplasia of colon with hemorrhage: Secondary | ICD-10-CM | POA: Diagnosis not present

## 2023-08-30 DIAGNOSIS — K5730 Diverticulosis of large intestine without perforation or abscess without bleeding: Secondary | ICD-10-CM | POA: Diagnosis not present

## 2023-08-30 DIAGNOSIS — K625 Hemorrhage of anus and rectum: Secondary | ICD-10-CM | POA: Diagnosis not present

## 2023-08-30 LAB — CBC
HCT: 28.2 % — ABNORMAL LOW (ref 39.0–52.0)
HCT: 30.7 % — ABNORMAL LOW (ref 39.0–52.0)
Hemoglobin: 8.9 g/dL — ABNORMAL LOW (ref 13.0–17.0)
Hemoglobin: 9.8 g/dL — ABNORMAL LOW (ref 13.0–17.0)
MCH: 31.1 pg (ref 26.0–34.0)
MCH: 31.2 pg (ref 26.0–34.0)
MCHC: 31.6 g/dL (ref 30.0–36.0)
MCHC: 31.9 g/dL (ref 30.0–36.0)
MCV: 98.6 fL (ref 80.0–100.0)
MCV: 97.8 fL (ref 80.0–100.0)
Platelets: 145 K/uL — ABNORMAL LOW (ref 150–400)
Platelets: 151 K/uL (ref 150–400)
RBC: 2.86 MIL/uL — ABNORMAL LOW (ref 4.22–5.81)
RBC: 3.14 MIL/uL — ABNORMAL LOW (ref 4.22–5.81)
RDW: 16.6 % — ABNORMAL HIGH (ref 11.5–15.5)
RDW: 16 % — ABNORMAL HIGH (ref 11.5–15.5)
WBC: 8.3 K/uL (ref 4.0–10.5)
WBC: 10 K/uL (ref 4.0–10.5)
nRBC: 0.2 % (ref 0.0–0.2)
nRBC: 0 % (ref 0.0–0.2)

## 2023-08-30 LAB — GLUCOSE, CAPILLARY
Glucose-Capillary: 115 mg/dL — ABNORMAL HIGH (ref 70–99)
Glucose-Capillary: 135 mg/dL — ABNORMAL HIGH (ref 70–99)
Glucose-Capillary: 203 mg/dL — ABNORMAL HIGH (ref 70–99)
Glucose-Capillary: 268 mg/dL — ABNORMAL HIGH (ref 70–99)

## 2023-08-30 LAB — TYPE AND SCREEN
ABO/RH(D): B POS
Antibody Screen: NEGATIVE
Unit division: 0
Unit division: 0

## 2023-08-30 LAB — BPAM RBC
Blood Product Expiration Date: 202507282359
Blood Product Expiration Date: 202508032359
ISSUE DATE / TIME: 202507082355
ISSUE DATE / TIME: 202507100555
Unit Type and Rh: 7300
Unit Type and Rh: 7300

## 2023-08-30 MED ORDER — NA SULFATE-K SULFATE-MG SULF 17.5-3.13-1.6 GM/177ML PO SOLN
0.5000 | Freq: Once | ORAL | Status: AC
  Administered 2023-08-30: 177 mL via ORAL

## 2023-08-30 MED ORDER — SIMETHICONE 80 MG PO CHEW
240.0000 mg | CHEWABLE_TABLET | Freq: Once | ORAL | Status: AC
  Administered 2023-08-30: 240 mg via ORAL
  Filled 2023-08-30: qty 3

## 2023-08-30 MED ORDER — NA SULFATE-K SULFATE-MG SULF 17.5-3.13-1.6 GM/177ML PO SOLN
0.5000 | Freq: Once | ORAL | Status: AC
  Administered 2023-08-30: 177 mL via ORAL
  Filled 2023-08-30: qty 1

## 2023-08-30 MED ORDER — TECHNETIUM TC 99M-LABELED RED BLOOD CELLS IV KIT
22.0000 | PACK | Freq: Once | INTRAVENOUS | Status: AC | PRN
  Administered 2023-08-30: 22 via INTRAVENOUS

## 2023-08-30 NOTE — Progress Notes (Addendum)
 Daily Progress Note  DOA: 08/27/2023 Hospital Day: 4   Cc:  Lower GI bleeding  Brief History:  89 y.o. year old male with a medical history including but not limited to CKD 4, CAD status post CABG, AAA repair, hypertension, DM 2, COPD on chronic oxygen , cholelithiasis, prostamegaly, diverticulosis, adenomatous colon polyps, cecal AVMs.    Admitted with GI bleed, presumably lower and anemia.  See 7/9 GI consult    ASSESSMENT    89 year old male with persistent painless GI bleeding.  History of cecal AVMs in 2024.   Unclear origin of bleeding at this point.   Diverticular hemorrhage versus cecal AVM bleeding vrs upper tract source .  >>TODAY: Daugher [name] in room. Family spoke with nursing staff during the night and patient apparently had 1 or 2 liquid maroon BMs. On DRE today there is fresh maroon stool in vault. VSS   Acute on chronic anemia 2/2 to above.  >>TODAY:  Hgb improved from 6.5 to 8.9 ( 3 am draw) after another unit of blood yesterday. However he has continued to have some episodes of bleeding since then.   Mild thrombocytopenia, new Platelets stable in 140's  Chronic diastolic heart failure (HCC) No signs of acute exacerbation  CKD (chronic kidney disease) stage 4  History of adenomatous colon polyps.  No polyps on colonoscopy in February 2024   Principal Problem:   Lower GI bleed Active Problems:   CAD (coronary artery disease)   Hypertension   Type 2 diabetes mellitus with hyperlipidemia (HCC)   COPD (chronic obstructive pulmonary disease) (HCC)   CKD (chronic kidney disease) stage 4, GFR 15-29 ml/min (HCC)   Chronic diastolic heart failure (HCC)   Vascular dementia (HCC)   Hematochezia   ABLA (acute blood loss anemia)   PLAN   --Repeat CBC now to make sure hgb stable.  --Patient at increased risk for sedation / procedures so need to try and localize bleeding if possible. Unlikely that bleeding brisk enough for CT angio to detect and would  like to avoid given CKD. Will get RBC scan.  --Continue BID PPI --Keep on clear liquids for now  ADDENDUM 6:30 pm Call from primary team and [facility] about positive RBC scan with activity in cecum 1. Active bleeding site believed to be in the cecum in the right lower quadrant. Later activity along the splenic flexure region probably represents transit from the cecum, less likely a second site of bleeding.  **I discussed results and plan with Dr. Legrand .    Patient still down in nuclear medicine.  I spoke with daughter [name] over the telephone and she is actually waiting in patient's room for him to return.  We discussed scan findings and plan for colonoscopy tomorrow.  Will start bowel prep now.  CBC now and then every 6 hours.    Subjective   Feels okay. Per family, had some loose maroon stools during the night.   Objective   GI Studies:   04/04/2022 EGD and colonoscopy done for iron deficiency anemia and reported dark stools EGD : normal esophagus.  Normal stomach.  Normal examined duodenum.  No specimens collected   Colonoscopy: Bowel prep was good.  2 nonbleeding colonic angiodysplastic lesions in the cecum.  Treated with APC.  Suspected to be the cause of IDA secondary to chronic GI blood loss.  Sigmoid diverticulosis.  Remainder of exam normal   Recent Labs    08/29/23 0436 08/29/23 1546 08/30/23 0312  WBC 7.2  8.2 8.3  HGB 6.5* 9.2* 8.9*  HCT 20.5* 29.3* 28.2*  MCV 103.5* 99.3 98.6  PLT 164 141* 145*   Recent Labs    08/28/23 0500  FOLATE 20.7  VITAMINB12 1,087*  FERRITIN 26  TIBC 356  IRONPCTSAT 50*   Recent Labs    08/27/23 2154 08/28/23 0500 08/29/23 0925  NA 142 142 143  K 5.6* 4.8 4.9  CL 102 103 102  CO2 27 29 32  GLUCOSE 469* 147* 130*  BUN 59* 58* 49*  CREATININE 2.68* 2.55* 2.37*  CALCIUM 8.5* 8.5* 8.8*   Recent Labs    08/27/23 2154  PROT 5.3*  ALBUMIN 3.2*  AST 16  ALT 15  ALKPHOS 43  BILITOT 0.6      Imaging:   ECHOCARDIOGRAM COMPLETE    ECHOCARDIOGRAM REPORT       Patient Name:   Brian [name] Date of Exam: 08/28/2023 Medical Rec #:  [id number]       Height:       71.0 in Accession #:    [id number]      Weight:       167.5 lb Date of Birth:  01-14-1932      BSA:          1.956 m Patient Age:    89 years        BP:           145/61 mmHg Patient Gender: M               HR:           68 bpm. Exam Location:  Inpatient  Procedure: 2D Echo, Cardiac Doppler and Color Doppler (Both Spectral and Color            Flow Doppler were utilized during procedure).  Indications:    I50.40* Unspecified combined systolic (congestive) and diastolic                 (congestive) heart failure   History:        Patient has prior history of Echocardiogram examinations, most                 recent 09/13/2021. CAD, Abnormal ECG, Arrythmias:SVT. AV block;                 Risk Factors:Hypertension and Diabetes.   Sonographer:    Ellouise Mose RDCS Sonographer#2:  Juliene Rucks Referring Phys: [id number] [facility]    Sonographer Comments: Technically difficult study due to poor echo windows. Patient had trouble staying in left decubitus position. IMPRESSIONS   1. Left ventricular ejection fraction, by estimation, is 60 to 65%. The left ventricle has normal function. The left ventricle has no regional wall motion abnormalities. There is severe left ventricular hypertrophy of the basal-septal segment. Left  ventricular diastolic parameters were normal.  2. Right ventricular systolic function is normal. The right ventricular size is normal.  3. The mitral valve is degenerative. Mild mitral valve regurgitation. No evidence of mitral stenosis. There is mild late systolic prolapse of posterior leaflet of the mitral valve.  4. The aortic valve is tricuspid. Aortic valve regurgitation is trivial. Mild aortic valve stenosis.  FINDINGS  Left Ventricle: Left ventricular ejection fraction, by estimation, is 60 to 65%. The  left ventricle has normal function. The left ventricle has no regional wall motion abnormalities. The left ventricular internal cavity size was normal in size. There is  severe left ventricular hypertrophy of the basal-septal segment.  Left ventricular diastolic parameters were normal.  Right Ventricle: The right ventricular size is normal. Right ventricular systolic function is normal.  Left Atrium: Left atrial size was normal in size.  Right Atrium: Right atrial size was normal in size.  Pericardium: There is no evidence of pericardial effusion.  Mitral Valve: The mitral valve is degenerative in appearance. There is mild late systolic prolapse of posterior leaflet of the mitral valve. Mild mitral annular calcification. Mild mitral valve regurgitation. No evidence of mitral valve stenosis. MV peak  gradient, 6.8 mmHg. The mean mitral valve gradient is 2.5 mmHg.  Tricuspid Valve: The tricuspid valve is grossly normal. Tricuspid valve regurgitation is mild . No evidence of tricuspid stenosis.  Aortic Valve: The aortic valve is tricuspid. Aortic valve regurgitation is trivial. Mild aortic stenosis is present. Aortic valve mean gradient measures 10.5 mmHg. Aortic valve peak gradient measures 20.8 mmHg.  Pulmonic Valve: The pulmonic valve was grossly normal. Pulmonic valve regurgitation is trivial. No evidence of pulmonic stenosis.  Aorta: The aortic root and ascending aorta are structurally normal, with no evidence of dilitation.  IAS/Shunts: No atrial level shunt detected by color flow Doppler.    LEFT VENTRICLE PLAX 2D LVIDd:         4.50 cm      Diastology LVIDs:         3.20 cm      LV e' medial:    8.08 cm/s LV PW:         1.10 cm      LV E/e' medial:  11.9 LV IVS:        1.30 cm      LV e' lateral:   10.40 cm/s LVOT diam:     2.00 cm      LV E/e' lateral: 9.3 LV SV:         64 LV SV Index:   33 LVOT Area:     3.14 cm   LV Volumes (MOD) LV vol d, MOD A2C: 101.0 ml LV vol d,  MOD A4C: 111.1 ml LV vol s, MOD A2C: 33.4 ml LV vol s, MOD A4C: 46.2 ml LV SV MOD A2C:     67.6 ml LV SV MOD A4C:     111.1 ml LV SV MOD BP:      68.4 ml  RIGHT VENTRICLE             IVC RV S prime:     10.60 cm/s  IVC diam: 1.60 cm TAPSE (M-mode): 1.5 cm  LEFT ATRIUM             Index        RIGHT ATRIUM           Index LA diam:        2.30 cm 1.18 cm/m   RA Area:     17.80 cm LA Vol (A2C):   28.0 ml 14.32 ml/m  RA Volume:   46.70 ml  23.88 ml/m LA Vol (A4C):   13.3 ml 6.80 ml/m LA Biplane Vol: 19.9 ml 10.18 ml/m  AORTIC VALVE AV Area (Vmax):    1.45 cm AV Area (Vmean):   1.35 cm AV Area (VTI):     1.32 cm AV Vmax:           228.00 cm/s AV Vmean:          153.500 cm/s AV VTI:            0.484 m AV Peak Grad:  20.8 mmHg AV Mean Grad:      10.5 mmHg LVOT Vmax:         105.00 cm/s LVOT Vmean:        66.200 cm/s LVOT VTI:          0.204 m LVOT/AV VTI ratio: 0.42   AORTA Ao Root diam: 3.30 cm Ao Asc diam:  3.55 cm  MITRAL VALVE MV Area (PHT): 2.46 cm     SHUNTS MV Area VTI:   2.09 cm     Systemic VTI:  0.20 m MV Peak grad:  6.8 mmHg     Systemic Diam: 2.00 cm MV Mean grad:  2.5 mmHg MV Vmax:       1.31 m/s MV Vmean:      74.4 cm/s MV Decel Time: 309 msec MV E velocity: 96.40 cm/s MV A velocity: 129.00 cm/s MV E/A ratio:  0.75  Redell Shallow MD Electronically signed by Redell Shallow MD Signature Date/Time: 08/28/2023/4:32:02 PM      Final   CT CHEST ABDOMEN PELVIS WO CONTRAST CLINICAL DATA:  Groin lymphadenopathy. Right red blood per rectum. Shortness of breath.  EXAM: CT CHEST, ABDOMEN AND PELVIS WITHOUT CONTRAST  TECHNIQUE: Multidetector CT imaging of the chest, abdomen and pelvis was performed following the standard protocol without IV contrast.  RADIATION DOSE REDUCTION: This exam was performed according to the departmental dose-optimization program which includes automated exposure control, adjustment of the mA and/or kV according  to patient size and/or use of iterative reconstruction technique.  COMPARISON:  04/26/2023  FINDINGS: CT CHEST FINDINGS  Cardiovascular: Heart is normal size. Aorta is normal caliber. Prior CABG. Diffuse aortic atherosclerosis.  Mediastinum/Nodes: No mediastinal, hilar, or axillary adenopathy. Trachea and esophagus are unremarkable. Thyroid  unremarkable.  Lungs/Pleura: Moderate to advanced emphysema. Small right pleural effusion, similar to prior study. Compressive atelectasis in the right lower lobe. Calcified pleural plaque at the right lung base is unchanged. Left upper lobe pulmonary nodule on image 40 measures 4 mm compared to 5 mm previously. Postradiation bandlike scarring in the right upper lobe is unchanged. Lingular nodule measures 4 mm compared to 6 mm previously. No new or enlarging pulmonary nodules.  Musculoskeletal: Chest wall soft tissues are unremarkable. No acute bony abnormality.  CT ABDOMEN PELVIS FINDINGS  Hepatobiliary: Gallbladder is contracted. Small layering gallstones likely present. No biliary ductal dilatation or focal hepatic abnormality.  Pancreas: No focal abnormality or ductal dilatation.  Spleen: No focal abnormality.  Normal size.  Adrenals/Urinary Tract: Renovascular calcifications. No definite urinary tract calcifications or hydronephrosis. No focal renal or adrenal abnormality. Urinary bladder unremarkable.  Stomach/Bowel: Normal appendix. Left colonic diverticulosis. No active diverticulitis. Stomach and small bowel decompressed, unremarkable. No obstruction or inflammatory process.  Vascular/Lymphatic: Status post stent graft repair of abdominal aortic aneurysm. Maximum aneurysm sac size 2.8 cm. No adenopathy.  Reproductive: Mildly enlarged prostate  Other: No free fluid or free air.  Musculoskeletal: No acute bony abnormality. Degenerative disc and facet disease in the lumbar spine.  IMPRESSION: Small right pleural effusion  with compressive atelectasis in the right lower lobe, similar prior study.  Scattered small pulmonary nodules, unchanged. No new or enlarging pulmonary nodules.  Moderate to advanced emphysema.  Probable small layering gallstones within the gallbladder which is contracted. No CT evidence of acute cholecystitis.  Aortic atherosclerosis.  Status post stent graft repair of AAA.  Prostate enlargement.  Left colonic diverticulosis.  No acute findings in the chest, abdomen or pelvis.  Electronically Signed   By: Franky  Dover M.D.   On: 08/28/2023 00:33     Scheduled inpatient medications:   cyanocobalamin   1,000 mcg Oral Daily   escitalopram   20 mg Oral Daily   furosemide   40 mg Oral Daily   insulin  aspart  0-5 Units Subcutaneous QHS   insulin  aspart  0-6 Units Subcutaneous TID WC   magnesium  oxide  400 mg Oral QHS   memantine   5 mg Oral BID   mirtazapine   15 mg Oral QHS   pantoprazole  (PROTONIX ) IV  40 mg Intravenous Q12H   predniSONE   10 mg Oral BID WC   simvastatin   40 mg Oral Daily   sodium chloride  flush  3 mL Intravenous Q12H   sulfamethoxazole -trimethoprim   1 tablet Oral Once per day on Monday Wednesday Friday   umeclidinium-vilanterol  1 puff Inhalation Daily   Continuous inpatient infusions:  PRN inpatient medications: ipratropium-albuterol   Vital signs in last 24 hours: Temp:  [97.3 F (36.3 C)-98.3 F (36.8 C)] 97.9 F (36.6 C) (07/11 1131) Pulse Rate:  [55-69] 60 (07/11 1131) Resp:  [18-20] 18 (07/11 1131) BP: (112-161)/(50-70) 152/60 (07/11 1131) SpO2:  [85 %-100 %] 100 % (07/11 1131) Last BM Date : 08/29/23  Intake/Output Summary (Last 24 hours) at 08/30/2023 1405 Last data filed at 08/30/2023 1100 Gross per 24 hour  Intake 3 ml  Output 1150 ml  Net -1147 ml    Intake/Output from previous day: 07/10 0701 - 07/11 0700 In: 382.2 [I.V.:3; Blood:379.2] Out: 650 [Urine:650] Intake/Output this shift: Total I/O In: -  Out: 500  [Urine:500]   Physical Exam:  General: Alert male in NAD Heart:  Regular rate and rhythm.  Pulmonary: Normal respiratory effort on 02 per [place]. Decreased breath sounds bilaterally Abdomen: Soft, nondistended, nontender. Normal bowel sounds. Rectal: fresh maroon liquid stool in vault Extremities: No lower extremity edema  Neurologic: Alert and oriented Psych: Pleasant. Cooperative     LOS: 0 days   Vina Dasen ,NP 08/30/2023, 2:05 PM   I have taken an interval history, thoroughly reviewed the chart and examined the patient. I agree with the Advanced Practitioner's note, impression and recommendations, and have recorded additional findings, impressions and recommendations below. I performed a substantive portion of this encounter (>50% time spent), including a complete performance of the medical decision making.  My additional thoughts are as follows:  He continues to have relatively slow (and probably intermittent) GI bleeding of unclear source.  Given his prior history and current presentation with maroon blood found on exam today by our APP, I suspect this is more likely lower.  His hemodynamic stability speaks against that being from a rapid upper GI bleed.  He remains at high risk for endoscopic procedures but it is increasingly appearing that we may have no choice but to pursue that since he continues to bleed.  I hope we may be able to localize an approximate source with a tagged RBC scan today if that can be arranged.  I discussed all that with him and his daughter [name] at the bedside, they understand the plan and are agreeable.  I am keeping him n.p.o. after midnight tonight in case he requires a procedure tomorrow.    [name] Office:[phone]

## 2023-08-30 NOTE — Progress Notes (Signed)
  Progress Note   Patient: Brian [name] FMW:[id number] DOB: Dec 09, 1931 DOA: 08/27/2023     0 DOS: the patient was seen and examined on 08/30/2023   Brief hospital course: Patient is an 89 year old male with past medical history significant for coronary artery disease, COPD, type 2 diabetes mellitus, hypertension, hyperlipidemia, vascular dementia, sigmoid diverticulosis and cecal AVMs.  Patient was admitted with rectal bleeding/blood loss anemia.  GI team has been directing care.  RBC scan is planned for today.  Assessment and Plan: GI bleed: -Query lower versus upper GI bleed - GI bleed continues to bleed. - GI is directing care. - For RBC scan today. - Last documented hemoglobin was 8.9 g/dL. - CBC in the morning.   -Continue IV pantoprazole  40 Mg every 12 hourly.    Acute blood loss anemia: - See above documentation. - Chronic anemia baseline.    -Iron panel with serum iron at 179, TIBC 356, transferrin saturation 50 and ferritin 26   Vascular dementia (HCC) -Continue Namenda      Chronic diastolic heart failure (HCC) No signs of acute exacerbation Continue diuresis with furosemide  40 mg po daily.   CKD (chronic kidney disease) stage 4, GFR 15-29 ml/min (HCC) -Stable. - Last visualized potassium was 4.9. - Continue to monitor renal function and electrolytes.    COPD (chronic obstructive pulmonary disease) (HCC) No signs of acute exacerbation  Continue chronic steroids and PJP prophylaxis.  Continue bronchodilator therapy   Type 2 diabetes mellitus with hyperlipidemia (HCC) Continue glucose cover and monitoring with insulin  sliding scale.  Continue statin   Hypertension Continue blood pressure monitoring  Systolic blood pressure in the 140 mmHg range.   CAD (coronary artery disease) No chest pain, continue to hold on antiplatelet therapy due to acute anemia     {Tip this will not be part of the note when signed Body mass index is 23.37 kg/m. , ,   (Optional):26781}  Subjective:  -Rectal bleeding continues.    Physical Exam: Vitals:   08/30/23 0038 08/30/23 0443 08/30/23 0838 08/30/23 1131  BP: (!) 144/63 (!) 112/50 (!) 148/59 (!) 152/60  Pulse: 63 (!) 55 65 60  Resp: 20 20 18 18   Temp: 98.2 F (36.8 C) 98.1 F (36.7 C) 98 F (36.7 C) 97.9 F (36.6 C)  TempSrc:   Oral Oral  SpO2: 99% (!) 85% 95% 100%  Weight:      Height:       Neurology awake and alert ENT with mild pallor Cardiovascular with S1 and S2 present and regular with no gallops, rubs or murmurs Respiratory with no rales or wheezing no rhonchi Abdomen with soft and non tender, not distended No lower extremity edema  Data Reviewed:    Family Communication: no family at the bedside   Disposition: Status is: Observation The patient remains OBS appropriate and will d/c before 2 midnights.  Planned Discharge Destination: Home {Tip this will not be part of the note when signed  DVT Prophylaxis  ., Scds  (Optional):26781}    Author: Leatrice LILLETTE Chapel, MD 08/30/2023 6:03 PM  For on call review [email or web address].

## 2023-08-30 NOTE — Progress Notes (Signed)
 Patient refused to get a bath, per patient and CNA.

## 2023-08-31 ENCOUNTER — Encounter (HOSPITAL_COMMUNITY): Payer: Self-pay | Admitting: Internal Medicine

## 2023-08-31 ENCOUNTER — Observation Stay (HOSPITAL_COMMUNITY): Admitting: Certified Registered"

## 2023-08-31 ENCOUNTER — Encounter (HOSPITAL_COMMUNITY)
Admission: EM | Discharge status: Home / Self Care | Payer: Self-pay | Source: Home / Self Care | Attending: Internal Medicine

## 2023-08-31 DIAGNOSIS — I2510 Atherosclerotic heart disease of native coronary artery without angina pectoris: Secondary | ICD-10-CM | POA: Diagnosis not present

## 2023-08-31 DIAGNOSIS — K6289 Other specified diseases of anus and rectum: Secondary | ICD-10-CM

## 2023-08-31 DIAGNOSIS — Q439 Congenital malformation of intestine, unspecified: Secondary | ICD-10-CM

## 2023-08-31 DIAGNOSIS — K5730 Diverticulosis of large intestine without perforation or abscess without bleeding: Secondary | ICD-10-CM | POA: Diagnosis not present

## 2023-08-31 DIAGNOSIS — K922 Gastrointestinal hemorrhage, unspecified: Secondary | ICD-10-CM | POA: Diagnosis not present

## 2023-08-31 DIAGNOSIS — J449 Chronic obstructive pulmonary disease, unspecified: Secondary | ICD-10-CM | POA: Diagnosis not present

## 2023-08-31 DIAGNOSIS — L729 Follicular cyst of the skin and subcutaneous tissue, unspecified: Secondary | ICD-10-CM | POA: Diagnosis not present

## 2023-08-31 DIAGNOSIS — K5521 Angiodysplasia of colon with hemorrhage: Secondary | ICD-10-CM | POA: Diagnosis not present

## 2023-08-31 DIAGNOSIS — D500 Iron deficiency anemia secondary to blood loss (chronic): Secondary | ICD-10-CM

## 2023-08-31 HISTORY — PX: COLONOSCOPY: SHX5424

## 2023-08-31 LAB — CBC
HCT: 33.1 % — ABNORMAL LOW (ref 39.0–52.0)
HCT: 30 % — ABNORMAL LOW (ref 39.0–52.0)
HCT: 30 % — ABNORMAL LOW (ref 39.0–52.0)
Hemoglobin: 10.5 g/dL — ABNORMAL LOW (ref 13.0–17.0)
Hemoglobin: 9.2 g/dL — ABNORMAL LOW (ref 13.0–17.0)
Hemoglobin: 9.4 g/dL — ABNORMAL LOW (ref 13.0–17.0)
MCH: 31.3 pg (ref 26.0–34.0)
MCH: 30.6 pg (ref 26.0–34.0)
MCH: 31.3 pg (ref 26.0–34.0)
MCHC: 31.7 g/dL (ref 30.0–36.0)
MCHC: 30.7 g/dL (ref 30.0–36.0)
MCHC: 31.3 g/dL (ref 30.0–36.0)
MCV: 98.5 fL (ref 80.0–100.0)
MCV: 99.7 fL (ref 80.0–100.0)
MCV: 100 fL (ref 80.0–100.0)
Platelets: 184 K/uL (ref 150–400)
Platelets: 153 K/uL (ref 150–400)
Platelets: 141 K/uL — ABNORMAL LOW (ref 150–400)
RBC: 3.36 MIL/uL — ABNORMAL LOW (ref 4.22–5.81)
RBC: 3.01 MIL/uL — ABNORMAL LOW (ref 4.22–5.81)
RBC: 3 MIL/uL — ABNORMAL LOW (ref 4.22–5.81)
RDW: 16.1 % — ABNORMAL HIGH (ref 11.5–15.5)
RDW: 15.9 % — ABNORMAL HIGH (ref 11.5–15.5)
RDW: 15.9 % — ABNORMAL HIGH (ref 11.5–15.5)
WBC: 11.3 K/uL — ABNORMAL HIGH (ref 4.0–10.5)
WBC: 8.7 K/uL (ref 4.0–10.5)
WBC: 9.4 K/uL (ref 4.0–10.5)
nRBC: 0 % (ref 0.0–0.2)
nRBC: 0 % (ref 0.0–0.2)
nRBC: 0 % (ref 0.0–0.2)

## 2023-08-31 LAB — GLUCOSE, CAPILLARY
Glucose-Capillary: 157 mg/dL — ABNORMAL HIGH (ref 70–99)
Glucose-Capillary: 96 mg/dL (ref 70–99)
Glucose-Capillary: 137 mg/dL — ABNORMAL HIGH (ref 70–99)
Glucose-Capillary: 105 mg/dL — ABNORMAL HIGH (ref 70–99)
Glucose-Capillary: 129 mg/dL — ABNORMAL HIGH (ref 70–99)

## 2023-08-31 SURGERY — COLONOSCOPY
Anesthesia: Monitor Anesthesia Care

## 2023-08-31 MED ORDER — PHENYLEPHRINE 80 MCG/ML (10ML) SYRINGE FOR IV PUSH (FOR BLOOD PRESSURE SUPPORT)
PREFILLED_SYRINGE | INTRAVENOUS | Status: DC | PRN
  Administered 2023-08-31: 40 ug via INTRAVENOUS
  Administered 2023-08-31 (×2): 80 ug via INTRAVENOUS
  Administered 2023-08-31: 40 ug via INTRAVENOUS

## 2023-08-31 MED ORDER — EPHEDRINE SULFATE-NACL 50-0.9 MG/10ML-% IV SOSY
PREFILLED_SYRINGE | INTRAVENOUS | Status: DC | PRN
  Administered 2023-08-31 (×2): 10 mg via INTRAVENOUS

## 2023-08-31 MED ORDER — SODIUM CHLORIDE 0.9 % IV SOLN
INTRAVENOUS | Status: AC | PRN
  Administered 2023-08-31: 500 mL via INTRAMUSCULAR

## 2023-08-31 MED ORDER — PROPOFOL 500 MG/50ML IV EMUL
INTRAVENOUS | Status: DC | PRN
  Administered 2023-08-31: 100 ug/kg/min via INTRAVENOUS

## 2023-08-31 MED ORDER — PROPOFOL 10 MG/ML IV BOLUS
INTRAVENOUS | Status: DC | PRN
  Administered 2023-08-31: 20 mg via INTRAVENOUS

## 2023-08-31 MED ORDER — LIDOCAINE 2% (20 MG/ML) 5 ML SYRINGE
INTRAMUSCULAR | Status: DC | PRN
  Administered 2023-08-31: 50 mg via INTRAVENOUS

## 2023-08-31 MED ORDER — GLUCAGON HCL RDNA (DIAGNOSTIC) 1 MG IJ SOLR
INTRAMUSCULAR | Status: DC | PRN
  Administered 2023-08-31: .5 mg via INTRAVENOUS

## 2023-08-31 NOTE — Anesthesia Preprocedure Evaluation (Addendum)
 Anesthesia Evaluation  Patient identified by MRN, date of birth, ID band Patient awake    Reviewed: Allergy & Precautions, NPO status , Patient's Chart, lab work & pertinent test results, reviewed documented beta blocker date and time   History of Anesthesia Complications Negative for: history of anesthetic complications  Airway Mallampati: II  TM Distance: >3 FB     Dental  (+) Edentulous Upper, Edentulous Lower   Pulmonary shortness of breath, asthma , COPD,  oxygen  dependent, former smoker   breath sounds clear to auscultation + decreased breath sounds      Cardiovascular hypertension, (-) angina + CAD, + Past MI, + CABG and + Peripheral Vascular Disease  + dysrhythmias  Rhythm:Regular Rate:Normal  1. Left ventricular ejection fraction, by estimation, is 60 to 65%. The  left ventricle has normal function. The left ventricle has no regional  wall motion abnormalities. There is severe left ventricular hypertrophy of  the basal-septal segment. Left  ventricular diastolic parameters were normal.   2. Right ventricular systolic function is normal. The right ventricular  size is normal.   3. The mitral valve is degenerative. Mild mitral valve regurgitation. No  evidence of mitral stenosis. There is mild late systolic prolapse of  posterior leaflet of the mitral valve.   4. The aortic valve is tricuspid. Aortic valve regurgitation is trivial.  Mild aortic valve stenosis.     Neuro/Psych neg Seizures PSYCHIATRIC DISORDERS Anxiety    Dementia  Neuromuscular disease    GI/Hepatic ,neg GERD  ,,(+) neg Cirrhosis        Endo/Other  diabetes, Type 2    Renal/GU CRFRenal disease     Musculoskeletal  (+) Arthritis ,    Abdominal   Peds  Hematology  (+) Blood dyscrasia (hgb 9.2), anemia   Anesthesia Other Findings   Reproductive/Obstetrics                              Anesthesia  Physical Anesthesia Plan  ASA: 4  Anesthesia Plan: MAC   Post-op Pain Management:    Induction: Intravenous  PONV Risk Score and Plan: 2 and Ondansetron   Airway Management Planned: Simple Face Mask and Natural Airway  Additional Equipment:   Intra-op Plan:   Post-operative Plan:   Informed Consent: I have reviewed the patients History and Physical, chart, labs and discussed the procedure including the risks, benefits and alternatives for the proposed anesthesia with the patient or authorized representative who has indicated his/her understanding and acceptance.     Dental advisory given  Plan Discussed with: CRNA  Anesthesia Plan Comments:         Anesthesia Quick Evaluation

## 2023-08-31 NOTE — Plan of Care (Signed)
 Problem: Education: Goal: Ability to describe self-care measures that may prevent or decrease complications (Diabetes Survival Skills Education) will improve 08/31/2023 2204 by Evern Monica HERO, RN Outcome: Progressing 08/31/2023 2000 by Evern Monica HERO, RN Outcome: Progressing Goal: Individualized Educational Video(s) 08/31/2023 2204 by Evern Monica HERO, RN Outcome: Progressing 08/31/2023 2000 by Evern Monica HERO, RN Outcome: Progressing   Problem: Coping: Goal: Ability to adjust to condition or change in health will improve 08/31/2023 2204 by Evern Monica HERO, RN Outcome: Progressing 08/31/2023 2000 by Evern Monica HERO, RN Outcome: Progressing   Problem: Fluid Volume: Goal: Ability to maintain a balanced intake and output will improve 08/31/2023 2204 by Evern Monica HERO, RN Outcome: Progressing 08/31/2023 2000 by Evern Monica HERO, RN Outcome: Progressing   Problem: Health Behavior/Discharge Planning: Goal: Ability to identify and utilize available resources and services will improve 08/31/2023 2204 by Evern Monica HERO, RN Outcome: Progressing 08/31/2023 2000 by Evern Monica HERO, RN Outcome: Progressing Goal: Ability to manage health-related needs will improve 08/31/2023 2204 by Evern Monica HERO, RN Outcome: Progressing 08/31/2023 2000 by Evern Monica HERO, RN Outcome: Progressing   Problem: Metabolic: Goal: Ability to maintain appropriate glucose levels will improve 08/31/2023 2204 by Evern Monica HERO, RN Outcome: Progressing 08/31/2023 2000 by Evern Monica HERO, RN Outcome: Progressing   Problem: Nutritional: Goal: Maintenance of adequate nutrition will improve 08/31/2023 2204 by Evern Monica HERO, RN Outcome: Progressing 08/31/2023 2000 by Evern Monica HERO, RN Outcome: Progressing Goal: Progress toward achieving an optimal weight will improve 08/31/2023 2204 by Evern Monica HERO, RN Outcome: Progressing 08/31/2023 2000 by Evern Monica HERO, RN Outcome:  Progressing   Problem: Skin Integrity: Goal: Risk for impaired skin integrity will decrease 08/31/2023 2204 by Evern Monica HERO, RN Outcome: Progressing 08/31/2023 2000 by Evern Monica HERO, RN Outcome: Progressing   Problem: Tissue Perfusion: Goal: Adequacy of tissue perfusion will improve 08/31/2023 2204 by Evern Monica HERO, RN Outcome: Progressing 08/31/2023 2000 by Evern Monica HERO, RN Outcome: Progressing   Problem: Education: Goal: Knowledge of General Education information will improve Description: Including pain rating scale, medication(s)/side effects and non-pharmacologic comfort measures 08/31/2023 2204 by Evern Monica HERO, RN Outcome: Progressing 08/31/2023 2000 by Evern Monica HERO, RN Outcome: Progressing   Problem: Health Behavior/Discharge Planning: Goal: Ability to manage health-related needs will improve 08/31/2023 2204 by Evern Monica HERO, RN Outcome: Progressing 08/31/2023 2000 by Evern Monica HERO, RN Outcome: Progressing   Problem: Clinical Measurements: Goal: Ability to maintain clinical measurements within normal limits will improve 08/31/2023 2204 by Evern Monica HERO, RN Outcome: Progressing 08/31/2023 2000 by Evern Monica HERO, RN Outcome: Progressing Goal: Will remain free from infection 08/31/2023 2204 by Evern Monica HERO, RN Outcome: Progressing 08/31/2023 2000 by Evern Monica HERO, RN Outcome: Progressing Goal: Diagnostic test results will improve 08/31/2023 2204 by Evern Monica HERO, RN Outcome: Progressing 08/31/2023 2000 by Evern Monica HERO, RN Outcome: Progressing Goal: Respiratory complications will improve 08/31/2023 2204 by Evern Monica HERO, RN Outcome: Progressing 08/31/2023 2000 by Evern Monica HERO, RN Outcome: Progressing Goal: Cardiovascular complication will be avoided 08/31/2023 2204 by Evern Monica HERO, RN Outcome: Progressing 08/31/2023 2000 by Evern Monica HERO, RN Outcome: Progressing   Problem: Activity: Goal: Risk  for activity intolerance will decrease 08/31/2023 2204 by Evern Monica HERO, RN Outcome: Progressing 08/31/2023 2000 by Evern Monica HERO, RN Outcome: Progressing   Problem: Nutrition: Goal: Adequate nutrition will be maintained 08/31/2023 2204 by Evern Monica HERO, RN Outcome: Progressing 08/31/2023 2000 by Evern Monica HERO, RN Outcome: Progressing  Problem: Coping: Goal: Level of anxiety will decrease 08/31/2023 2204 by Evern Monica HERO, RN Outcome: Progressing 08/31/2023 2000 by Evern Monica HERO, RN Outcome: Progressing   Problem: Elimination: Goal: Will not experience complications related to bowel motility 08/31/2023 2204 by Evern Monica HERO, RN Outcome: Progressing 08/31/2023 2000 by Evern Monica HERO, RN Outcome: Progressing Goal: Will not experience complications related to urinary retention 08/31/2023 2204 by Evern Monica HERO, RN Outcome: Progressing 08/31/2023 2000 by Evern Monica HERO, RN Outcome: Progressing   Problem: Pain Managment: Goal: General experience of comfort will improve and/or be controlled 08/31/2023 2204 by Evern Monica HERO, RN Outcome: Progressing 08/31/2023 2000 by Evern Monica HERO, RN Outcome: Progressing   Problem: Safety: Goal: Ability to remain free from injury will improve 08/31/2023 2204 by Evern Monica HERO, RN Outcome: Progressing 08/31/2023 2000 by Evern Monica HERO, RN Outcome: Progressing   Problem: Skin Integrity: Goal: Risk for impaired skin integrity will decrease 08/31/2023 2204 by Evern Monica HERO, RN Outcome: Progressing 08/31/2023 2000 by Evern Monica HERO, RN Outcome: Progressing

## 2023-08-31 NOTE — Op Note (Signed)
 [facility] Patient Name: Brian [name] Procedure Date : 08/31/2023 MRN: [id number] Attending MD: Victory CROME. Legrand , MD, [id number] Date of Birth: 1931/04/22 CSN: [id number] Age: 89 Admit Type: Inpatient Procedure:                Colonoscopy Indications:              Hematochezia, Acute post hemorrhagic anemia Providers:                Victory L. Legrand, MD, Robie Breed, RN,                            Joya Bunnell, Technician Referring MD:             [facility] hospitalist Medicines:                Monitored Anesthesia Care. Also glucagon  0.5 mg IV Complications:            No immediate complications. Estimated Blood Loss:     Estimated blood loss was minimal. Procedure:                Pre-Anesthesia Assessment:                           - Prior to the procedure, a History and Physical                            was performed, and patient medications and                            allergies were reviewed. The patient's tolerance of                            previous anesthesia was also reviewed. The risks                            and benefits of the procedure and the sedation                            options and risks were discussed with the patient.                            All questions were answered, and informed consent                            was obtained. Prior Anticoagulants: The patient has                            taken no anticoagulant or antiplatelet agents. ASA                            Grade Assessment: IV - A patient with severe                            systemic disease that is a constant threat to life.  After reviewing the risks and benefits, the patient                            was deemed in satisfactory condition to undergo the                            procedure.                           After obtaining informed consent, the colonoscope                            was passed under direct vision.  Throughout the                            procedure, the patient's blood pressure, pulse, and                            oxygen  saturations were monitored continuously. The                            CF-HQ190L ([id number]) Olympus colonoscope was                            introduced through the anus and advanced to the the                            cecum, identified by appendiceal orifice and                            ileocecal valve. The colonoscopy was performed with                            difficulty due to a redundant colon, spasm,                            retained blood in the colon and challenging                            location of bleeding sources. Successful completion                            of the procedure was aided by using manual                            pressure, straightening and shortening the scope to                            obtain bowel loop reduction and lavage. The patient                            tolerated the procedure well. The quality of the  bowel preparation was fair (blood). The ileocecal                            valve, appendiceal orifice, and rectum were                            photographed. Scope In: 10:23:47 AM Scope Out: 11:11:28 AM Scope Withdrawal Time: 0 hours 39 minutes 46 seconds  Total Procedure Duration: 0 hours 47 minutes 41 seconds  Findings:      The digital rectal exam findings include decreased sphincter tone. There       was also incidentally a small cyst or other firm skin/ soft tissue       abnormality of the right buttock that appeared to have some irregular       tissue on top of it. No fluid or pus could be expressed from it.      Red blood was found in the entire colon.      A few small angiodysplastic lesions without bleeding were found in the       proximal ascending colon. Coagulation for hemostasis using argon plasma       was successful. (Right colon setting)      Three small  angiodysplastic lesions with intermittent bleeding were       found in the cecum (2 were definitely bleeding, one had some fresh blood       over it, but could have been blood from the other lesions). Coagulation       for hemostasis using argon plasma of all 3 lesions was successful. For       hemostasis, five hemostatic clips were successfully placed (MR       conditional). 1 clip on one lesion, 2 clips on each of the other 2       lesions (see photos)      Diverticula were found in the left colon. There was associated       tortuosity as well as a redundant sigmoid colon.      Extensive lavage performed, mucosa could not be completely cleared due       to the amount of residual blood present. No other obvious bleeding       sources seen. Impression:               - Preparation of the colon was fair.                           - Decreased sphincter tone found on digital rectal                            exam.                           - Blood in the entire examined colon.                           - A few non-bleeding colonic angiodysplastic                            lesions. Treated with argon plasma coagulation                            (  APC).                           - Three bleeding colonic angiodysplastic lesions.                            Treated with argon plasma coagulation (APC). Clips                            (MR conditional) were placed.                           - Diverticulosis in the left colon.                           - No specimens collected. Recommendation:           - Return patient to hospital ward for ongoing care.                           - Full liquid diet. If no evidence of recurrent                            bleeding by tomorrow, will likely advance to                            regular diet. Note that this patient will continue                            to pass old blood at least until tomorrow.                           - Hemoglobin hematocrit this  evening and CBC                            tomorrow                           Monitor closely for further bleeding                           Recommend hospitalist to examine patient's buttock                            lesion noted above. Procedure Code(s):        --- Professional ---                           [phone], Colonoscopy, flexible; with control of                            bleeding, any method Diagnosis Code(s):        --- Professional ---                           K62.89, Other specified diseases of anus and rectum  K92.2, Gastrointestinal hemorrhage, unspecified                           K55.21, Angiodysplasia of colon with hemorrhage                           K92.1, Melena (includes Hematochezia)                           D62, Acute posthemorrhagic anemia                           K57.30, Diverticulosis of large intestine without                            perforation or abscess without bleeding CPT copyright 2022 [employer]. All rights reserved. The codes documented in this report are preliminary and upon coder review may  be revised to meet current compliance requirements. Rudie Rikard L. Legrand, MD 08/31/2023 11:30:57 AM This report has been signed electronically. Number of Addenda: 0

## 2023-08-31 NOTE — Transfer of Care (Signed)
 Immediate Anesthesia Transfer of Care Note  Patient: Brian [name]  Procedure(s) Performed: COLONOSCOPY  Patient Location: PACU  Anesthesia Type:MAC  Level of Consciousness: drowsy  Airway & Oxygen  Therapy: Patient Spontanous Breathing and Patient connected to face mask oxygen   Post-op Assessment: Report given to RN and Post -op Vital signs reviewed and stable  Post vital signs: Reviewed and stable  Last Vitals:  Vitals Value Taken Time  BP 118/40 08/31/23 11:20  Temp 36.2 C 08/31/23 11:19  Pulse 58 08/31/23 11:25  Resp 18 08/31/23 11:25  SpO2 100 % 08/31/23 11:25  Vitals shown include unfiled device data.  Last Pain:  Vitals:   08/31/23 1119  TempSrc: Temporal  PainSc: Asleep      Patients Stated Pain Goal: 0 (08/30/23 1930)  Complications: No notable events documented.

## 2023-08-31 NOTE — Interval H&P Note (Signed)
 History and Physical Interval Note:  08/31/2023 9:54 AM  Brian [name]  has presented today for surgery, with the diagnosis of Lower GI bleed.  The various methods of treatment have been discussed with the patient and family. After consideration of risks, benefits and other options for treatment, the patient has consented to  Procedure(s): COLONOSCOPY (N/A) as a surgical intervention.  The patient's history has been reviewed, patient examined, no change in status, stable for surgery.  I have reviewed the patient's chart and labs.  Questions were answered to the patient's satisfaction.     [name]

## 2023-08-31 NOTE — Plan of Care (Signed)

## 2023-08-31 NOTE — Anesthesia Postprocedure Evaluation (Signed)
 Anesthesia Post Note  Patient: Brian [name]  Procedure(s) Performed: COLONOSCOPY     Patient location during evaluation: PACU Anesthesia Type: MAC Level of consciousness: awake and alert Pain management: pain level controlled Vital Signs Assessment: post-procedure vital signs reviewed and stable Respiratory status: spontaneous breathing, nonlabored ventilation, respiratory function stable and patient connected to nasal cannula oxygen  Cardiovascular status: stable and blood pressure returned to baseline Postop Assessment: no apparent nausea or vomiting Anesthetic complications: no   No notable events documented.  Last Vitals:  Vitals:   08/31/23 1130 08/31/23 1140  BP: 114/75 (!) 146/43  Pulse: (!) 57 (!) 57  Resp: 20 16  Temp:    SpO2: 100% 99%    Last Pain:  Vitals:   08/31/23 1140  TempSrc:   PainSc: 0-No pain                 Lynwood MARLA Cornea

## 2023-08-31 NOTE — Progress Notes (Signed)
  Progress Note   Patient: Brian [name] FMW:[id number] DOB: 1932/01/06 DOA: 08/27/2023     0 DOS: the patient was seen and examined on 08/31/2023   Brief hospital course: Patient is an 89 year old male with past medical history significant for coronary artery disease, COPD, type 2 diabetes mellitus, hypertension, hyperlipidemia, vascular dementia, sigmoid diverticulosis and cecal AVMs.  Patient was admitted with rectal bleeding/blood loss anemia.  GI team has been directing care.  RBC scan is planned for today.  08/30/2023: RBC scan revealed active bleeding at the cecum.  Communicated same with GI team.  For colonoscopy tomorrow.  08/31/2023: Patient seen alongside patient's daughter and granddaughter.  Patient underwent colonoscopy today.  Colonoscopy revealed blood in the entire colon, a few nonbleeding colonic angiodysplastic lesions that were treated with argon plasma coagulation, 3 bleeding colonic angiodysplastic lesions, treated with argon plasma coagulation, as well as, placement of clips (MR conditional); diverticulosis of the left colon also noted.  Assessment and Plan: Lower GI bleed/Cecal angiodysplastic lesions: - RBC scan revealed active bleeding at the cecal area. - Patient underwent colonoscopy today. - Lower GI bleed, likely secondary to cecal angiodysplastic lesion, s/p argon plasma coagulation and placement of clip. -H/H has been stable overnight.         - Continue to follow H/H. - Clear liquid today.  GI is directing. - Last documented hemoglobin was 9.2 g/dL. - CBC in the morning.    Acute blood loss anemia: - See above documentation. - Chronic anemia at baseline.    -Iron panel with serum iron at 179, TIBC 356, transferrin saturation 50 and ferritin 26   Vascular dementia (HCC) -Continue Namenda      Chronic diastolic heart failure (HCC) No signs of acute exacerbation Continue diuresis with furosemide  40 mg po daily.   CKD (chronic kidney disease) stage 4, GFR  15-29 ml/min (HCC) -Stable. - Last visualized potassium was 4.9. - Continue to monitor renal function and electrolytes.    COPD (chronic obstructive pulmonary disease) (HCC) -Stable.   Type 2 diabetes mellitus with hyperlipidemia (HCC) - Continue glucose monitoring and insulin  sliding scale.  - Continue statin   Hypertension -Continue blood pressure monitoring  -Systolic blood pressure in the 140 mmHg range.   CAD (coronary artery disease) No chest pain, continue to hold on antiplatelet therapy due to acute anemia       Subjective:  -No new complaints.    Physical Exam: Vitals:   08/31/23 0953 08/31/23 1119 08/31/23 1130 08/31/23 1140  BP: (!) 154/61 (!) 116/41 114/75 (!) 146/43  Pulse: 67 61 (!) 57 (!) 57  Resp: 12 18 20 16   Temp: 97.6 F (36.4 C) (!) 97.1 F (36.2 C)    TempSrc: Temporal Temporal    SpO2: 100% 100% 100% 99%  Weight:      Height:       General Condition: Not in any distress. HEENT: Patient is pale.   Cardiovascular: S1-S2. Lungs: Clear to auscultation. Neuro: Awake and alert. Abdomen: Soft and nontender. Extremities: No lower extremity edema.    Data Reviewed:    Family Communication: Daughter and Granddaughter.  Disposition: Status is: Observation The patient remains OBS appropriate and will d/c before 2 midnights.  Planned Discharge Destination: Home     Author: Leatrice LILLETTE Chapel, MD 08/31/2023 4:06 PM  For on call review [email or web address].

## 2023-09-01 DIAGNOSIS — D62 Acute posthemorrhagic anemia: Secondary | ICD-10-CM | POA: Diagnosis not present

## 2023-09-01 DIAGNOSIS — D696 Thrombocytopenia, unspecified: Secondary | ICD-10-CM | POA: Diagnosis present

## 2023-09-01 DIAGNOSIS — K921 Melena: Secondary | ICD-10-CM | POA: Diagnosis not present

## 2023-09-01 DIAGNOSIS — K5731 Diverticulosis of large intestine without perforation or abscess with bleeding: Secondary | ICD-10-CM | POA: Diagnosis present

## 2023-09-01 DIAGNOSIS — K922 Gastrointestinal hemorrhage, unspecified: Secondary | ICD-10-CM | POA: Diagnosis not present

## 2023-09-01 DIAGNOSIS — F0283 Dementia in other diseases classified elsewhere, unspecified severity, with mood disturbance: Secondary | ICD-10-CM | POA: Diagnosis present

## 2023-09-01 DIAGNOSIS — F0153 Vascular dementia, unspecified severity, with mood disturbance: Secondary | ICD-10-CM | POA: Diagnosis present

## 2023-09-01 DIAGNOSIS — J439 Emphysema, unspecified: Secondary | ICD-10-CM | POA: Diagnosis present

## 2023-09-01 DIAGNOSIS — I130 Hypertensive heart and chronic kidney disease with heart failure and stage 1 through stage 4 chronic kidney disease, or unspecified chronic kidney disease: Secondary | ICD-10-CM | POA: Diagnosis present

## 2023-09-01 DIAGNOSIS — E1151 Type 2 diabetes mellitus with diabetic peripheral angiopathy without gangrene: Secondary | ICD-10-CM | POA: Diagnosis present

## 2023-09-01 DIAGNOSIS — K5521 Angiodysplasia of colon with hemorrhage: Secondary | ICD-10-CM | POA: Diagnosis not present

## 2023-09-01 DIAGNOSIS — D631 Anemia in chronic kidney disease: Secondary | ICD-10-CM | POA: Diagnosis present

## 2023-09-01 DIAGNOSIS — Z1152 Encounter for screening for COVID-19: Secondary | ICD-10-CM | POA: Diagnosis not present

## 2023-09-01 DIAGNOSIS — E1122 Type 2 diabetes mellitus with diabetic chronic kidney disease: Secondary | ICD-10-CM | POA: Diagnosis present

## 2023-09-01 DIAGNOSIS — G309 Alzheimer's disease, unspecified: Secondary | ICD-10-CM | POA: Diagnosis present

## 2023-09-01 DIAGNOSIS — I5032 Chronic diastolic (congestive) heart failure: Secondary | ICD-10-CM | POA: Diagnosis present

## 2023-09-01 DIAGNOSIS — N184 Chronic kidney disease, stage 4 (severe): Secondary | ICD-10-CM | POA: Diagnosis present

## 2023-09-01 DIAGNOSIS — J4489 Other specified chronic obstructive pulmonary disease: Secondary | ICD-10-CM | POA: Diagnosis present

## 2023-09-01 DIAGNOSIS — D7589 Other specified diseases of blood and blood-forming organs: Secondary | ICD-10-CM | POA: Diagnosis present

## 2023-09-01 DIAGNOSIS — E1165 Type 2 diabetes mellitus with hyperglycemia: Secondary | ICD-10-CM | POA: Diagnosis present

## 2023-09-01 LAB — CBC
HCT: 27.4 % — ABNORMAL LOW (ref 39.0–52.0)
Hemoglobin: 8.4 g/dL — ABNORMAL LOW (ref 13.0–17.0)
MCH: 30.7 pg (ref 26.0–34.0)
MCHC: 30.7 g/dL (ref 30.0–36.0)
MCV: 100 fL (ref 80.0–100.0)
Platelets: 133 K/uL — ABNORMAL LOW (ref 150–400)
RBC: 2.74 MIL/uL — ABNORMAL LOW (ref 4.22–5.81)
RDW: 15.7 % — ABNORMAL HIGH (ref 11.5–15.5)
WBC: 7.6 K/uL (ref 4.0–10.5)
nRBC: 0 % (ref 0.0–0.2)

## 2023-09-01 LAB — GLUCOSE, CAPILLARY
Glucose-Capillary: 112 mg/dL — ABNORMAL HIGH (ref 70–99)
Glucose-Capillary: 208 mg/dL — ABNORMAL HIGH (ref 70–99)
Glucose-Capillary: 334 mg/dL — ABNORMAL HIGH (ref 70–99)
Glucose-Capillary: 239 mg/dL — ABNORMAL HIGH (ref 70–99)

## 2023-09-01 MED ORDER — BOOST / RESOURCE BREEZE PO LIQD CUSTOM
1.0000 | Freq: Three times a day (TID) | ORAL | Status: DC
  Administered 2023-09-01 – 2023-09-02 (×5): 1 via ORAL

## 2023-09-01 NOTE — Progress Notes (Addendum)
 Spoke over the phone with patient's daughter. She states that her father ( the patient ) have some bleeding near the rectal area, but she believes is not related with an active rectal bleeding. She says that the patient could scratch himself and this cause its bleeding. I just assessed the patient's butt and could see some scant bleeding on his briefs. However, it appears to be old ( was dry ), was not bright red and it was scant. Provider was notified. Will continue to monitor.

## 2023-09-01 NOTE — Progress Notes (Signed)
 [facility] Progress Note  Chief Complaint: Hematochezia  History:  No reports of recurrent bleeding since yesterday's procedure.  Today Mr. Aumiller feels well, he was asleep when I entered the room having eaten some of his full liquid diet breakfast, but he awakens easily and voices no complaints of abdominal pain or vomiting.  He denies chest pain.  Has chronic stable dyspnea    Objective:   Current Facility-Administered Medications:    cyanocobalamin  (VITAMIN B12) tablet 1,000 mcg, 1,000 mcg, Oral, Daily, Danis, Victory CROME III, MD, 1,000 mcg at 09/01/23 0907   escitalopram  (LEXAPRO ) tablet 20 mg, 20 mg, Oral, Daily, Danis, Victory CROME III, MD, 20 mg at 09/01/23 0908   feeding supplement (BOOST / RESOURCE BREEZE) liquid 1 Container, 1 Container, Oral, TID BM, Danis, Victory CROME III, MD   furosemide  (LASIX ) tablet 40 mg, 40 mg, Oral, Daily, Danis, Victory CROME III, MD, 40 mg at 09/01/23 0907   insulin  aspart (novoLOG ) injection 0-5 Units, 0-5 Units, Subcutaneous, QHS, Legrand Victory CROME III, MD, 3 Units at 08/30/23 2114   insulin  aspart (novoLOG ) injection 0-6 Units, 0-6 Units, Subcutaneous, TID WC, Legrand Victory CROME III, MD, 2 Units at 08/29/23 1514   ipratropium-albuterol  (DUONEB) 0.5-2.5 (3) MG/3ML nebulizer solution 3 mL, 3 mL, Nebulization, Q6H PRN, Danis, Victory CROME III, MD   magnesium  oxide (MAG-OX) tablet 400 mg, 400 mg, Oral, QHS, Danis, Victory CROME III, MD, 400 mg at 08/31/23 2143   memantine  (NAMENDA ) tablet 5 mg, 5 mg, Oral, BID, Legrand Victory CROME III, MD, 5 mg at 09/01/23 0907   mirtazapine  (REMERON ) tablet 15 mg, 15 mg, Oral, QHS, Danis, Victory CROME III, MD, 15 mg at 08/31/23 2143   pantoprazole  (PROTONIX ) injection 40 mg, 40 mg, Intravenous, Q12H, Danis, Victory CROME III, MD, 40 mg at 09/01/23 0908   predniSONE  (DELTASONE ) tablet 10 mg, 10 mg, Oral, BID WC, Legrand Victory CROME III, MD, 10 mg at 09/01/23 0908   simvastatin  (ZOCOR ) tablet 40 mg, 40 mg, Oral, Daily, Danis, Victory CROME III, MD, 40 mg at 08/31/23 2143   sodium  chloride flush (NS) 0.9 % injection 3 mL, 3 mL, Intravenous, Q12H, Danis, Victory CROME III, MD, 3 mL at 09/01/23 0908   sulfamethoxazole -trimethoprim  (BACTRIM  DS) 800-160 MG per tablet 1 tablet, 1 tablet, Oral, Once per day on Monday Wednesday Friday, Danis, Victory CROME III, MD, 1 tablet at 08/30/23 9082   umeclidinium-vilanterol (ANORO ELLIPTA ) 62.5-25 MCG/ACT 1 puff, 1 puff, Inhalation, Daily, Legrand Victory CROME III, MD, 1 puff at 09/01/23 0819     Vital signs in last 24 hrs: Vitals:   09/01/23 0809 09/01/23 0819  BP: (!) 149/57   Pulse: 69   Resp: 17   Temp: 97.9 F (36.6 C)   SpO2: 100% 100%    Intake/Output Summary (Last 24 hours) at 09/01/2023 1024 Last data filed at 09/01/2023 0315 Gross per 24 hour  Intake 266 ml  Output 875 ml  Net -609 ml     Physical Exam  Cardiac: RRR without murmurs, S1S2 heard, no peripheral edema Pulm: Prolonged expiratory phase and and expiratory wheezing bilaterally (though improved from a day or 2 ago) normal RR and normal respiratory effort noted Abdomen: soft, no tenderness, with active bowel sounds. No guarding or palpable hepatosplenomegaly Skin; warm and dry, no jaundice  Recent Labs:     Latest Ref Rng & Units 08/31/2023   12:24 PM 08/31/2023    8:07 AM 08/31/2023    1:01 AM  CBC  WBC  4.0 - 10.5 K/uL 9.4  8.7  11.3   Hemoglobin 13.0 - 17.0 g/dL 9.4  9.2  89.4   Hematocrit 39.0 - 52.0 % 30.0  30.0  33.1   Platelets 150 - 400 K/uL 141  153  184     No results for input(s): INR in the last 168 hours.    Latest Ref Rng & Units 08/29/2023    9:25 AM 08/28/2023    5:00 AM 08/27/2023    9:54 PM  CMP  Glucose 70 - 99 mg/dL 869  852  530   BUN 8 - 23 mg/dL 49  58  59   Creatinine 0.61 - 1.24 mg/dL 7.62  7.44  7.31   Sodium 135 - 145 mmol/L 143  142  142   Potassium 3.5 - 5.1 mmol/L 4.9  4.8  5.6   Chloride 98 - 111 mmol/L 102  103  102   CO2 22 - 32 mmol/L 32  29  27   Calcium 8.9 - 10.3 mg/dL 8.8  8.5  8.5   Total Protein 6.5 - 8.1 g/dL   5.3    Total Bilirubin 0.0 - 1.2 mg/dL   0.6   Alkaline Phos 38 - 126 U/L   43   AST 15 - 41 U/L   16   ALT 0 - 44 U/L   15      Radiologic studies:   Assessment & Plan  Assessment: Hematochezia Acute blood loss anemia Bleeding source was right colon AVMs, treated with endoscopic therapies as detailed in my colonoscopy report from yesterday  Patient is doing well without evidence of overt rebleeding at this point. Last hemoglobin was last evening, this morning's labs are either pending or not yet been drawn, will check into that with nursing. Advancing to regular diet, and inpatient GI service signing off.  I would keep him for observation until tomorrow and if he is doing well at that point with no evidence of recurrent bleeding then he can be discharged home from our perspective for as needed follow-up with the GI clinic.  He would benefit from a dose of IV iron prior to discharge if he meets criteria for that.  If he does not, then please give him about a month of oral iron supplements and follow-up CBC with primary care within 2 weeks after discharge.  (His family should be made aware that the iron tablets will turn his stool black)   [name] Office: [phone]

## 2023-09-01 NOTE — Plan of Care (Signed)
   Problem: Education: Goal: Ability to describe self-care measures that may prevent or decrease complications (Diabetes Survival Skills Education) will improve Outcome: Progressing Goal: Individualized Educational Video(s) Outcome: Progressing

## 2023-09-01 NOTE — Plan of Care (Signed)

## 2023-09-01 NOTE — Progress Notes (Signed)
 Progress Note   Patient: Brian [name] FMW:[id number] DOB: 1932/01/22 DOA: 08/27/2023     0 DOS: the patient was seen and examined on 09/01/2023   Brief hospital course: Patient is an 89 year old male with past medical history significant for coronary artery disease, COPD, type 2 diabetes mellitus, hypertension, hyperlipidemia, vascular dementia, sigmoid diverticulosis and cecal AVMs.  Patient was admitted with rectal bleeding/blood loss anemia.  GI team has been directing care.  RBC scan is planned for today.  08/30/2023: RBC scan revealed active bleeding at the cecum.  Communicated same with GI team.  For colonoscopy tomorrow.  08/31/2023: Patient seen alongside patient's daughter and granddaughter.  Patient underwent colonoscopy today.  Colonoscopy revealed blood in the entire colon, a few nonbleeding colonic angiodysplastic lesions that were treated with argon plasma coagulation, 3 bleeding colonic angiodysplastic lesions, treated with argon plasma coagulation, as well as, placement of clips (MR conditional); diverticulosis of the left colon also noted.  09/01/2023: Patient seen alongside patient's granddaughter.  Hemoglobin of 8.4 g/dL noted.  No new rectal bleed reported.  Patient has recurrent lesion right anal verge, chronic (patient will follow-up with colorectal surgery team on discharge).  Assessment and Plan: Lower GI bleed/Cecal angiodysplastic lesions: - RBC scan revealed active bleeding at the cecal area. - Patient underwent colonoscopy today. - Lower GI bleed, likely secondary to cecal angiodysplastic lesion, s/p argon plasma coagulation and placement of clip. -H/H has been stable overnight.         - Continue to follow H/H. - Clear liquid today.  GI is directing. 09/01/2023: No further bleeding.  Hemoglobin of 8.4 g/dL.  Diet is being advanced.  Repeat H&H in the morning.  Possible discharge tomorrow.  Acute blood loss anemia: - See above documentation. - Chronic anemia at  baseline.    -Iron panel with serum iron at 179, TIBC 356, transferrin saturation 50 and ferritin 26 09/01/2023: See above documentation.  Vascular dementia (HCC) -Continue Namenda      Chronic diastolic heart failure (HCC) No signs of acute exacerbation Continue diuresis with furosemide  40 mg po daily.   CKD (chronic kidney disease) stage 4, GFR 15-29 ml/min (HCC) -Stable. - Last visualized potassium was 4.9. - Continue to monitor renal function and electrolytes.    COPD (chronic obstructive pulmonary disease) (HCC) -Stable.   Type 2 diabetes mellitus with hyperlipidemia (HCC) - Continue glucose monitoring and insulin  sliding scale.  - Continue statin   Hypertension -Continue blood pressure monitoring  -Systolic blood pressure in the 140 mmHg range.   CAD (coronary artery disease) No chest pain, continue to hold on antiplatelet therapy due to acute anemia       Subjective:  -No new complaints. - No further bleeding reported.  Physical Exam: Vitals:   09/01/23 0809 09/01/23 0819 09/01/23 1218 09/01/23 1626  BP: (!) 149/57  (!) 134/57 122/60  Pulse: 69  65 76  Resp: 17  16 17   Temp: 97.9 F (36.6 C)  97.8 F (36.6 C) 98.1 F (36.7 C)  TempSrc:      SpO2: 100% 100% 93% 92%  Weight:      Height:       General Condition: Not in any distress. HEENT: Patient is pale.   Cardiovascular: S1-S2. Lungs: Clear to auscultation. Neuro: Awake and alert. Abdomen: Soft and nontender. Extremities: No lower extremity edema.    Data Reviewed:    Family Communication: Daughter and Granddaughter.  Disposition: Status is: Inpatient. The patient remains OBS appropriate and will d/c before 2  midnights.  Planned Discharge Destination: Home     Author: Leatrice LILLETTE Chapel, MD 09/01/2023 7:22 PM  For on call review [email or web address].

## 2023-09-02 ENCOUNTER — Encounter (HOSPITAL_COMMUNITY): Payer: Self-pay | Admitting: Gastroenterology

## 2023-09-02 DIAGNOSIS — K922 Gastrointestinal hemorrhage, unspecified: Secondary | ICD-10-CM | POA: Diagnosis not present

## 2023-09-02 LAB — CBC WITH DIFFERENTIAL/PLATELET
Abs Immature Granulocytes: 0.06 K/uL (ref 0.00–0.07)
Basophils Absolute: 0 K/uL (ref 0.0–0.1)
Basophils Relative: 0 %
Eosinophils Absolute: 0 K/uL (ref 0.0–0.5)
Eosinophils Relative: 0 %
HCT: 26.3 % — ABNORMAL LOW (ref 39.0–52.0)
Hemoglobin: 8.3 g/dL — ABNORMAL LOW (ref 13.0–17.0)
Immature Granulocytes: 1 %
Lymphocytes Relative: 11 %
Lymphs Abs: 0.9 K/uL (ref 0.7–4.0)
MCH: 31.3 pg (ref 26.0–34.0)
MCHC: 31.6 g/dL (ref 30.0–36.0)
MCV: 99.2 fL (ref 80.0–100.0)
Monocytes Absolute: 0.7 K/uL (ref 0.1–1.0)
Monocytes Relative: 8 %
Neutro Abs: 6.9 K/uL (ref 1.7–7.7)
Neutrophils Relative %: 80 %
Platelets: 128 K/uL — ABNORMAL LOW (ref 150–400)
RBC: 2.65 MIL/uL — ABNORMAL LOW (ref 4.22–5.81)
RDW: 15 % (ref 11.5–15.5)
WBC: 8.6 K/uL (ref 4.0–10.5)
nRBC: 0 % (ref 0.0–0.2)

## 2023-09-02 LAB — GLUCOSE, CAPILLARY
Glucose-Capillary: 171 mg/dL — ABNORMAL HIGH (ref 70–99)
Glucose-Capillary: 322 mg/dL — ABNORMAL HIGH (ref 70–99)
Glucose-Capillary: 265 mg/dL — ABNORMAL HIGH (ref 70–99)

## 2023-09-02 MED ORDER — PREDNISONE 10 MG PO TABS: 10.0000 mg | ORAL_TABLET | Freq: Two times a day (BID) | ORAL | 0 refills | Status: DC

## 2023-09-02 MED ORDER — MEMANTINE HCL 5 MG PO TABS: 5.0000 mg | ORAL_TABLET | Freq: Two times a day (BID) | ORAL | 0 refills | Status: DC

## 2023-09-02 MED ORDER — FUROSEMIDE 40 MG PO TABS: 40.0000 mg | ORAL_TABLET | Freq: Every day | ORAL | 0 refills | Status: DC

## 2023-09-02 NOTE — Plan of Care (Signed)
  Problem: Education: Goal: Ability to describe self-care measures that may prevent or decrease complications (Diabetes Survival Skills Education) will improve Outcome: Completed/Met Goal: Individualized Educational Video(s) Outcome: Completed/Met

## 2023-09-02 NOTE — Progress Notes (Signed)
 Patient refused to have a bath and being changed.

## 2023-09-02 NOTE — Discharge Summary (Signed)
 Physician Discharge Summary  Patient ID: Brian [name] MRN: [id number] DOB/AGE: 03/12/1931 89 y.o.  Admit date: 08/27/2023 Discharge date: 09/02/2023  Admission Diagnoses:  Discharge Diagnoses:  Principal Problem:   Lower GI bleed Active Problems:   CAD (coronary artery disease)   Hypertension   Type 2 diabetes mellitus with hyperlipidemia (HCC)   COPD (chronic obstructive pulmonary disease) (HCC)   CKD (chronic kidney disease) stage 4, GFR 15-29 ml/min (HCC)   Chronic diastolic heart failure (HCC)   Vascular dementia (HCC)   Hematochezia   ABLA (acute blood loss anemia)   AVM (arteriovenous malformation) of colon with hemorrhage   Discharged Condition: stable  Hospital Course:  Patient is an 89 year old male with past medical history significant for coronary artery disease, COPD, type 2 diabetes mellitus, hypertension, hyperlipidemia, vascular dementia, sigmoid diverticulosis and cecal AVMs.  Patient was admitted for further assessment and management of rectal bleeding/blood loss anemia.  Workup revealed lower GI bleed from cecal AVMs.  Patient underwent RBC scan that was positive.  Colonoscopy revealed cecal AVMs that was treated by the GI team.  Hemoglobin has remained stable.  Patient will be discharged back in today to the care of the primary care provider.  Patient will follow-up with GI team and colorectal surgery team on discharge.  Apparently, patient has had recurrent lesion at the right anal verge.    Lower GI bleed/Cecal angiodysplastic lesions: - RBC scan revealed active bleeding at the cecal area. - Patient underwent colonoscopy.. - Lower GI bleed, likely secondary to cecal angiodysplastic lesion, s/p argon plasma coagulation and placement of clip. - H&H has remained stable. - Patient be discharged back home to the care of the primary care provider and GI team.  Follow-up with colorectal surgery team on discharge.  Acute blood loss anemia: - See above  documentation. - Chronic anemia at baseline.    -Iron panel with serum iron at 179, TIBC 356, transferrin saturation 50 and ferritin 26   Vascular dementia (HCC) -Continue Namenda       Chronic diastolic heart failure (HCC) No signs of acute exacerbation Continue diuresis with furosemide  40 mg po daily.    CKD (chronic kidney disease) stage 4, GFR 15-29 ml/min (HCC) -Stable. - Last visualized potassium was 4.9. - Continue to monitor renal function and electrolytes.     COPD (chronic obstructive pulmonary disease) (HCC) -Stable.    Type 2 diabetes mellitus with hyperlipidemia (HCC) - Continue glucose monitoring and insulin  sliding scale.  - Continue statin    Hypertension -Continue blood pressure monitoring  -Systolic blood pressure in the 140 mmHg range.    CAD (coronary artery disease) No chest pain, continue to hold on antiplatelet therapy due to acute anemia       Consults: GI  Significant Diagnostic Studies:  Colonoscopy revealed: The digital rectal exam findings include decreased sphincter tone. There       was also incidentally a small cyst or other firm skin/ soft tissue       abnormality of the right buttock that appeared to have some irregular       tissue on top of it. No fluid or pus could be expressed from it.      Red blood was found in the entire colon.      A few small angiodysplastic lesions without bleeding were found in the       proximal ascending colon. Coagulation for hemostasis using argon plasma       was successful. (Right colon setting)  Three small angiodysplastic lesions with intermittent bleeding were       found in the cecum (2 were definitely bleeding, one had some fresh blood       over it, but could have been blood from the other lesions). Coagulation       for hemostasis using argon plasma of all 3 lesions was successful. For       hemostasis, five hemostatic clips were successfully placed (MR       conditional). 1 clip on one  lesion, 2 clips on each of the other 2       lesions (see photos)      Diverticula were found in the left colon. There was associated       tortuosity as well as a redundant sigmoid colon.      Extensive lavage performed, mucosa could not be completely cleared due       to the amount of residual blood present. No other obvious bleeding       sources seen. Impression:               - Preparation of the colon was fair.                           - Decreased sphincter tone found on digital rectal                            exam.                           - Blood in the entire examined colon.                           - A few non-bleeding colonic angiodysplastic                            lesions. Treated with argon plasma coagulation                            (APC).                           - Three bleeding colonic angiodysplastic lesions.                            Treated with argon plasma coagulation (APC). Clips                            (MR conditional) were placed.                           - Diverticulosis in the left colon.                           - No specimens collected.  Treatments: Patient underwent colonoscopy and treatment of cecal AVM  Discharge Exam: Blood pressure (!) 140/58, pulse 67, temperature 98 F (36.7 C), resp. rate 17, height 5' 11 (1.803 m), weight 76 kg, SpO2 100%.   Disposition: Discharge disposition: 01-Home or Self Care       Discharge Instructions  Diet - low sodium heart healthy   Complete by: As directed    Increase activity slowly   Complete by: As directed       Allergies as of 09/02/2023       Reactions   Meperidine Hcl Swelling, Other (See Comments)   Tongue swelling Because of a history of documented adverse serious drug reaction, Medi Alert bracelet  is recommended.   Sertraline  Shortness Of Breath, Other (See Comments)   Burning sensation from Head to toe, diff breathing   Gabapentin  Other (See Comments)   Confusion    Memantine  Other (See Comments)   Can take only up to 5 mg a day- causes lethargy past that amount   Latex Rash, Other (See Comments)   Minor rash   Zolpidem Tartrate Other (See Comments)   Nightmares        Medication List     STOP taking these medications    Aspirin  81 MG EC tablet   escitalopram  20 MG tablet Commonly known as: LEXAPRO    Magnesium  Oxide 420 MG Tabs   OXYGEN    Systane Complete PF 0.6 % Soln Generic drug: Propylene Glycol (PF)   Theratears 0.25 % Soln Generic drug: Carboxymethylcellulose Sodium   Theraworx Muscle Cramps Foam   triamcinolone  cream 0.1 % Commonly known as: KENALOG        TAKE these medications    albuterol  108 (90 Base) MCG/ACT inhaler Commonly known as: VENTOLIN  HFA Inhale 2 puffs into the lungs every 6 (six) hours as needed for wheezing or shortness of breath.   Anoro Ellipta  62.5-25 MCG/ACT Aepb Generic drug: umeclidinium-vilanterol USE 1 INHALATION DAILY   Cyanocobalamin  1000 MCG Caps Take 1,000 mcg by mouth daily.   ferrous sulfate  325 (65 FE) MG tablet Take 1 tablet (325 mg total) by mouth daily with breakfast.   furosemide  40 MG tablet Commonly known as: LASIX  Take 1 tablet (40 mg total) by mouth daily. Start taking on: September 03, 2023   memantine  5 MG tablet Commonly known as: NAMENDA  Take 1 tablet (5 mg total) by mouth 2 (two) times daily. What changed:  medication strength how much to take how to take this when to take this additional instructions   mirtazapine  15 MG tablet Commonly known as: REMERON  TAKE 1 TABLET(15 MG) BY MOUTH AT BEDTIME   predniSONE  10 MG tablet Commonly known as: DELTASONE  Take 1 tablet (10 mg total) by mouth 2 (two) times daily with a meal. What changed: See the new instructions.   simvastatin  40 MG tablet Commonly known as: ZOCOR  Take 1 tablet (40 mg total) by mouth daily.   sulfamethoxazole -trimethoprim  800-160 MG tablet Commonly known as: BACTRIM  DS TAKE 1 TABLET BY  MOUTH AS A SINGLE DOSE. TAKE 1 TABLET ON MONDAY, WEDNESDAY, AND FRIDAY   Vitamin D3 50 MCG (2000 UT) capsule Take 2,000 Units by mouth daily.       Time spent: 35 minutes.  SignedBETHA Leatrice LILLETTE Rosario 09/02/2023, 3:54 PM

## 2023-09-02 NOTE — Inpatient Diabetes Management (Signed)
 Inpatient Diabetes Program Recommendations  AACE/ADA: New Consensus Statement on Inpatient Glycemic Control (2015)  Target Ranges:  Prepandial:   less than 140 mg/dL      Peak postprandial:   less than 180 mg/dL (1-2 hours)      Critically ill patients:  140 - 180 mg/dL    Latest Reference Range & Units 09/01/23 08:10 09/01/23 12:18 09/01/23 16:25 09/01/23 20:43  Glucose-Capillary 70 - 99 mg/dL 887 (H) 791 (H)  2 units Novolog   334 (H)  4 units Novolog   239 (H)  2 units Novolog    (H): Data is abnormally high     Home DM Meds: None Listed--Diet Controlled  Current Orders: Novolog  0-6 units TID AC + HS     MD- Note pt getting Prednisone  10 mg BID  Afternoon CBGs are elevated >200  Please consider adding low dose Novolog  Meal Coverage while pt getting Steroids:  Novolog  3 units TID with meals HOLD if pt NPO HOLD if pt eats <50% meals    --Will follow patient during hospitalization--  Adina Rudolpho Arrow RN, MSN, CDCES Diabetes Coordinator Inpatient Glycemic Control Team Team Pager: [phone] (8a-5p)

## 2023-09-03 ENCOUNTER — Telehealth: Payer: Self-pay

## 2023-09-03 NOTE — Patient Instructions (Signed)
 Visit Information  Thank you for taking time to visit with me today. Please don't hesitate to contact me if I can be of assistance to you before our next scheduled telephone appointment.  Our next appointment is by telephone on 09/12/23 at 10 am  Following is a copy of your care plan:   Goals Addressed             This Visit's Progress    VBCI Transitions of Care (TOC) Care Plan       Problems:  Recent Hospitalization for treatment of Gastrointestinal bleed Knowledge Deficit Related to gastrointestinal bleed and No Hospital Follow Up Provider appointment    Goal:  Over the next 30 days, the patient will not experience hospital readmission  Interventions:  Transitions of Care: Doctor Visits  - discussed the importance of doctor visits Medications reviewed and compliance discussed Offered to schedule hospital follow up with primary care provider.  Daughter states she will call and schedule Reviewed signs of GI bleed and advised to notify provider for symptoms and/ or seek emergency services/ 911 for severe symptoms Reviewed discharged summary and answered daughters questions regarding advised medication to stop taking and provided clarity on lasix  dosage.  Reviewed heart failure action plan/ symptoms. Advised to reports symptoms to provider or call 911 for severe symptoms Advised to schedule follow up visit with nephrologist and pulmonologist.  Advised to continue to wear oxygen  and prescribed/ recommended by provider Advised to continue to monitor weight, BP, and blood sugar readings reporting readings outside of established parameters to provider.   Patient Self Care Activities:  Attend all scheduled provider appointments Call pharmacy for medication refills 3-7 days in advance of running out of medications Call provider office for new concerns or questions  Take medications as prescribed   Schedule hospital follow up visit with primary care provider. Continue ongoing  monitoring of BP, weight, and blood sugar reporting readings outside of provider established parameters.   Plan:  Telephone follow up appointment with care management team member scheduled for:  09/12/23 at 10 am The patient has been provided with contact information for the care management team and has been advised to call with any health related questions or concerns.         Patient verbalizes understanding of instructions and care plan provided today and agrees to view in MyChart. Active MyChart status and patient understanding of how to access instructions and care plan via MyChart confirmed with patient.     The patient has been provided with contact information for the care management team and has been advised to call with any health related questions or concerns.   Please call the care guide team at [phone] if you need to cancel or reschedule your appointment.   Please call the Suicide and Crisis Lifeline: 988 call 1-800-273-TALK (toll free, 24 hour hotline) if you are experiencing a Mental Health or Behavioral Health Crisis or need someone to talk to.  Arvin Seip RN, BSN, CCM [employer], Population Health Case Manager Phone: [phone]

## 2023-09-03 NOTE — Transitions of Care (Post Inpatient/ED Visit) (Unsigned)
 09/03/2023  Name: Brian [name] MRN: [id number] DOB: December 18, 1931  Today's TOC FU Call Status: Today's TOC FU Call Status:: Successful TOC FU Call Completed TOC FU Call Complete Date: 09/03/23 Patient's Name and Date of Birth confirmed.  Transition Care Management Follow-up Telephone Call Date of Discharge: 09/02/23 Discharge Facility: [facility]) Type of Discharge: Inpatient Admission Primary Inpatient Discharge Diagnosis:: Lower Gastrointestinal Bleed How have you been since you were released from the hospital?: Better (daughter states patient is doing much better. She denies patient having any ongoing GI symptoms/ bleeding. She states patient oxygen  level is at 4 L and he is breathing better.) Any questions or concerns?: Yes Patient Questions/Concerns:: Daughter asked for clarity regarding the medications that were stopped at hospital discharge and also wanted to clarify the dosage for patients lasix . Patient Questions/Concerns Addressed: Other: (Caregiver/ daughter question/ concerns were addressed.)  Items Reviewed: Did you receive and understand the discharge instructions provided?: Yes Medications obtained,verified, and reconciled?: Yes (Medications Reviewed) Any new allergies since your discharge?: No Dietary orders reviewed?: Yes Type of Diet Ordered:: low sodium, heart healthy Do you have support at home?: Yes People in Home [RPT]: child(ren), adult Name of Support/Comfort Primary Source: [name]  Medications Reviewed Today: Medications Reviewed Today     Reviewed by Ingrid Shifrin E, RN (Registered Nurse) on 09/03/23 at 1047  Med List Status: <None>   Medication Order Taking? Sig Documenting Provider Last Dose Status Informant  albuterol  (VENTOLIN  HFA) 108 (90 Base) MCG/ACT inhaler [id number] Yes Inhale 2 puffs into the lungs every 6 (six) hours as needed for wheezing or shortness of breath. Geofm Glade PARAS, MD  Active Child, Self, Pharmacy Records            Med Note (LEE, NICOLE   Wed Aug 28, 2023  1:13 AM) RESCUE INHALER; Last taken over 6 mos ago.  ANORO ELLIPTA  62.5-25 MCG/ACT AEPB [id number] Yes USE 1 INHALATION DAILY Burns, Glade PARAS, MD  Active Child, Self, Pharmacy Records  Cholecalciferol  (VITAMIN D3) 50 MCG (2000 UT) capsule [id number] Yes Take 2,000 Units by mouth daily. [provider]  Active Child, Self, Pharmacy Records  Cyanocobalamin  1000 MCG CAPS [id number] Yes Take 1,000 mcg by mouth daily. [provider]  Active Self, Child, Pharmacy Records  ferrous sulfate  325 (65 FE) MG tablet [id number] Yes Take 1 tablet (325 mg total) by mouth daily with breakfast. Geofm Glade PARAS, MD  Active Child, Self, Pharmacy Records  furosemide  (LASIX ) 40 MG tablet [id number] Yes Take 1 tablet (40 mg total) by mouth daily. Rosario Leatrice FERNS, MD  Active   memantine  (NAMENDA ) 5 MG tablet [id number] Yes Take 1 tablet (5 mg total) by mouth 2 (two) times daily. Rosario Leatrice I, MD  Active   mirtazapine  (REMERON ) 15 MG tablet [id number] Yes TAKE 1 TABLET(15 MG) BY MOUTH AT BEDTIME Geofm Glade PARAS, MD  Active Child, Self, Pharmacy Records  predniSONE  (DELTASONE ) 10 MG tablet [id number] Yes Take 1 tablet (10 mg total) by mouth 2 (two) times daily with a meal. Rosario Leatrice FERNS, MD  Active   simvastatin  (ZOCOR ) 40 MG tablet [id number] Yes Take 1 tablet (40 mg total) by mouth daily. Geofm Glade PARAS, MD  Active Child, Self, Pharmacy Records  sulfamethoxazole -trimethoprim  (BACTRIM  DS) 800-160 MG tablet [id number] Yes TAKE 1 TABLET BY MOUTH AS A SINGLE DOSE. TAKE 1 TABLET ON MONDAY, WEDNESDAY, AND FRIDAY Byrum, Lamar RAMAN, MD  Active Child, Self, Pharmacy Records  Med List Note Leobardo Garre, CPhT 08/28/23 0130):  PATIENT HAS DIFFICULTY SWALLOWING LARGE PILLS; PLEASE BREAK IN HALF. All local 30 day or less are filled at [facility] on [address].            Home Care and Equipment/Supplies: Were Home Health Services Ordered?: No Any new equipment or medical  supplies ordered?: No  Functional Questionnaire: Do you need assistance with bathing/showering or dressing?: No (daughter states patient is able to do his own bathing however she will ask his primary care provider to order a shower chair.) Do you need assistance with meal preparation?: Yes Do you need assistance with eating?: No Do you have difficulty maintaining continence: No Do you need assistance with getting out of bed/getting out of a chair/moving?: No Do you have difficulty managing or taking your medications?: Yes  Follow up appointments reviewed: PCP Follow-up appointment confirmed?: No (patient has regular follow up appointment scheduled for 10/01/23.  Daughter advised that patient needs hospital follow up visit. Daughter was offered assistance to schedule hospital follow up. Daughter states she will schedule hospital follow up visit.) MD Provider Line Number:[phone] Given:  (n/a) Specialist Hospital Follow-up appointment confirmed?: NA (Daughter states she will call and schedule patient a follow up visit with his nephrologist and pulmonologist.) Do you need transportation to your follow-up appointment?: No Do you understand care options if your condition(s) worsen?: Yes-patient verbalized understanding  SDOH Interventions Today    Flowsheet Row Most Recent Value  SDOH Interventions   Food Insecurity Interventions Intervention Not Indicated  Housing Interventions Intervention Not Indicated  Transportation Interventions Intervention Not Indicated  Utilities Interventions Intervention Not Indicated    Goals Addressed             This Visit's Progress    VBCI Transitions of Care (TOC) Care Plan       Problems:  Recent Hospitalization for treatment of Gastrointestinal bleed Knowledge Deficit Related to gastrointestinal bleed and No Hospital Follow Up Provider appointment    Goal:  Over the next 30 days, the patient will not experience hospital  readmission  Interventions:  Transitions of Care: Doctor Visits  - discussed the importance of doctor visits Medications reviewed and compliance discussed Offered to schedule hospital follow up with primary care provider.  Daughter states she will call and schedule Reviewed signs of GI bleed and advised to notify provider for symptoms and/ or seek emergency services/ 911 for severe symptoms Reviewed discharged summary and answered daughters questions regarding advised medication to stop taking and provided clarity on lasix  dosage.  Reviewed heart failure action plan/ symptoms. Advised to reports symptoms to provider or call 911 for severe symptoms Advised to schedule follow up visit with nephrologist and pulmonologist.  Advised to continue to wear oxygen  and prescribed/ recommended by provider Advised to continue to monitor weight, BP, and blood sugar readings reporting readings outside of established parameters to provider.  TOC 30 day program offered to patient - Daughter Aleksa Catterton verbally agreed to 30 day TOC program and follow up.   Patient Self Care Activities:  Attend all scheduled provider appointments Call pharmacy for medication refills 3-7 days in advance of running out of medications Call provider office for new concerns or questions  Take medications as prescribed   Schedule hospital follow up visit with primary care provider. Continue ongoing monitoring of BP, weight, and blood sugar reporting readings outside of provider established parameters.   Plan:  Telephone follow up appointment with care management team member scheduled for:  09/12/23 at 10 am The patient has been provided  with contact information for the care management team and has been advised to call with any health related questions or concerns.         Discussed and offered 30 day TOC program.  Patient's daughter, Azaryah Oleksy agreed to 30 day TOC program. The patient has been provided with contact  information for the care management team and has been advised to call with any health -related questions or concerns.  The patient verbalized understanding with current plan of care.  The patient is directed to their insurance card regarding availability of benefits coverage.    Arvin Seip RN, BSN, CCM [employer], Population Health Case Manager Phone: [phone]

## 2023-09-04 ENCOUNTER — Encounter: Payer: Self-pay | Admitting: Emergency Medicine

## 2023-09-04 ENCOUNTER — Encounter: Payer: Self-pay | Admitting: Internal Medicine

## 2023-09-04 NOTE — Telephone Encounter (Signed)
 Called and spoke with Brian [name] per DPR. Pt has been scheduled HFU with [name]. Nothing further needed.

## 2023-09-09 ENCOUNTER — Inpatient Hospital Stay: Admitting: Internal Medicine

## 2023-09-12 ENCOUNTER — Other Ambulatory Visit: Payer: Self-pay | Admitting: *Deleted

## 2023-09-12 NOTE — Transitions of Care (Post Inpatient/ED Visit) (Signed)
 Transition of Care week 2/ day # 9  Visit Note  09/12/2023  Name: Brian [name] MRN: [id number]          DOB: 1931/03/16  Situation: Patient enrolled in [facility] 30-day program. 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 (1) unplanned hospital admission x last (12) and (6) months   Initial Transition Care Management Follow-up Telephone Call    Past Medical History:  Diagnosis Date   AAA (abdominal aortic aneurysm) (HCC)    Arthritis    Asthma 1976   Bronchiolitis 06/2014   CAD (coronary artery disease)    NSTEMI 02/2010;  s/p CABG 1/12 (L-LAD, SOM1, S-PDA); echo in 08/2010: Mild LVH, EF 55%, grade 2 diastolic dysfunction, mild MR, mild LAE, mildly reduced RV function, mild RAE, PASP 42   Carpal tunnel syndrome of left wrist    Cecal angiodysplasia 04/04/2022   Colon polyps    COPD (chronic obstructive pulmonary disease) (HCC)    Diabetes mellitus 2000   Type II, controlls with diet   Dyspnea    ED (erectile dysfunction)    Gout    History of radiation therapy 03/09/2020-03/16/2020   SBRT right lung: Dr. Lynwood Nasuti   HTN (hypertension)    Hyperlipidemia    Inguinal hernia    Right   Macular degeneration    Myocardial infarction [facility]) 1 -13-2012   Neuropathy    Assessment:  caregiver/ daughter [name]: He is so much better this week; back to his normal self; he is eating good and having normal and regular bowel movements; I check each and every BM that he has for signs of blood- so far, there have been no signs of more bleeding.  He is using his cane and walker, no new falls.  We are going to see his PCP next week as planned.  I have no concerns at all today    Caregiver denies clinical concerns throughout [facility] 30-day program outreach call today  Patient Reported Symptoms: Cognitive  Cognitive Status: Normal speech and language skills, Alert and oriented to person, place, and time, Requires Assistance Decision Making, Poor judgment in daily scenarios, Struggling with memory recall (per caregiver/ daughter: Jackson- patient has dementia: daughter manages all aspects of healthcare needs/ resides with patient and is pimary caregiver) Cognitive/Intellectual Conditions Management [RPT]: Other Other: Vascular dementia      Neurological Neurological Review of Symptoms: No symptoms reported (per caregiver/ daughter: [name]: patient is much stronger than he was last week) Neurological Management Strategies: Routine screening, Medication therapy, Coping strategies  HEENT HEENT Symptoms Reported: No symptoms reported (per caregiver/ daughter: [name])      Cardiovascular Cardiovascular Symptoms Reported: No symptoms reported (per caregiver/ daughter: [name]: continnue to monitor blood pressures at home periodically; caregiver is not near recorded values from home monitoring: reports his blood pressures have all been great- in normal ranges) Does patient have uncontrolled Hypertension?: No Cardiovascular Management Strategies: Medication therapy, Routine screening, Coping strategies, Diet modification Weight: 167 lb (75.8 kg) (home reported value per caregiver/ daughter: [name])  Respiratory Respiratory Symptoms Reported: Shortness of breath Other Respiratory Symptoms: per caregiver/ daughter: [name]: His breathing is much better- back to his normal- he is always a little short of breath with activity, but he is using his home oxygen  at 4 L/min, and he gets back to rested and relaxed breathing as  soon as he sits down; caregiver denies cough, shortness of breath outside of baseline Additional Respiratory Details: Confirmed per caregiver patient continues to use home O2 at 4 L/min per baseline, prior to hospital admission Respiratory Management Strategies: Routine screening, Coping  strategies, Oxygen  therapy, Adequate rest, Medication therapy  Endocrine Endocrine Symptoms Reported: No symptoms reported Is patient diabetic?: Yes Is patient checking blood sugars at home?: Yes List most recent blood sugar readings, include date and time of day: per caregiver/ daughter: [name]: continues to monitor blood sugars periodically at home; she reports general ranges between 120-140 fasting; 200-240 post-prandial; noted patient is not currently on medication for DMII    Gastrointestinal Gastrointestinal Symptoms Reported: No symptoms reported Additional Gastrointestinal Details: per caregiver/ daughter: [name]: patient has great appetite, and is having normal and regular bowel movements; he does not have any signs of blood in his poop, I check each and every BM he has; Reports last BM on 09/11/23; Reinforced signs/ symptoms GI bleeding, including those ot associated with signs in bowel movements along with corresponding action plan- caregiver verbalizes excellent understanding of same without prompting Gastrointestinal Management Strategies: Coping strategies (caregiver monitoring each and every BM patient has for signs and symptoms of GI bleeding)    Genitourinary Genitourinary Symptoms Reported: No symptoms reported Additional Genitourinary Details: Per daughter/ caregiver: He is peeing fine and in good amounts, nice clear yellow urine Genitourinary Management Strategies: Medication therapy  Integumentary Integumentary Symptoms Reported: No symptoms reported (per caregiver/ daughter [name])    Musculoskeletal Musculoskelatal Symptoms Reviewed: Difficulty walking, Limited mobility, Weakness, Unsteady gait Additional Musculoskeletal Details: provided education/ reinforcement around fall prevention: confirmed per caregiver continues to use assistive devices on regular basis, at baseline -- cane/ walker; confirmed no changes to fall assessment completed 09/03/23: she denies new/ recent  falls post-hospital discharge on 09/02/23 Musculoskeletal Management Strategies: Routine screening, Medical device, Coping strategies      Psychosocial Psychosocial Symptoms Reported: No symptoms reported (per caregiver/ daughter [name]) Additional Psychological Details: Confirmed per caregiver/ daughter Jackson- she communicated with behavioral Health provider at [facility]: Lexapro  has been re-started: she is not near patient's medications at time of [facility] call for me to add to medication list: advised her to take dose to upcoming PCP office visit 09/18/23 so medication can be added to medication list: she verbalizes understanding/ agreement and states she will do Behavioral Management Strategies: Medication therapy Major Change/Loss/Stressor/Fears (CP): Denies Techniques to Cope with Loss/Stress/Change: Medication Quality of Family Relationships: supportive   There were no vitals filed for this visit.  Medications Reviewed Today     Reviewed by Herschell Virani M, RN (Registered Nurse) on 09/12/23 at 1004  Med List Status: <None>   Medication Order Taking? Sig Documenting Provider Last Dose Status Informant  albuterol  (VENTOLIN  HFA) 108 (90 Base) MCG/ACT inhaler [id number]  Inhale 2 puffs into the lungs every 6 (six) hours as needed for wheezing or shortness of breath. Geofm Glade PARAS, MD  Active Child, Self, Pharmacy Records           Med Note (LEE, NICOLE   Wed Aug 28, 2023  1:13 AM) RESCUE INHALER; Last taken over 6 mos ago.  ANORO ELLIPTA  62.5-25 MCG/ACT AEPB [id number]  USE 1 INHALATION DAILY Burns, Glade PARAS, MD  Active Child, Self, Pharmacy Records  Cholecalciferol  (VITAMIN D3) 50 MCG (2000 UT) capsule [id number]  Take 2,000 Units by mouth daily. [provider]  Active Child, Self, Pharmacy Records  Cyanocobalamin  1000 MCG CAPS [id number]  Take 1,000 mcg by  mouth daily. [provider]  Active Self, Child, Pharmacy Records  ferrous sulfate  325 (65 FE) MG tablet [id number]  Take 1 tablet  (325 mg total) by mouth daily with breakfast. Geofm Glade PARAS, MD  Active Child, Self, Pharmacy Records  furosemide  (LASIX ) 40 MG tablet [id number]  Take 1 tablet (40 mg total) by mouth daily. Rosario Leatrice FERNS, MD  Active   memantine  (NAMENDA ) 5 MG tablet [id number]  Take 1 tablet (5 mg total) by mouth 2 (two) times daily. Rosario Leatrice I, MD  Active   mirtazapine  (REMERON ) 15 MG tablet [id number]  TAKE 1 TABLET(15 MG) BY MOUTH AT BEDTIME Burns, Glade PARAS, MD  Active Child, Self, Pharmacy Records  predniSONE  (DELTASONE ) 10 MG tablet [id number]  Take 1 tablet (10 mg total) by mouth 2 (two) times daily with a meal. Rosario Leatrice FERNS, MD  Active   simvastatin  (ZOCOR ) 40 MG tablet [id number]  Take 1 tablet (40 mg total) by mouth daily. Geofm Glade PARAS, MD  Active Child, Self, Pharmacy Records  sulfamethoxazole -trimethoprim  (BACTRIM  DS) 800-160 MG tablet [id number]  TAKE 1 TABLET BY MOUTH AS A SINGLE DOSE. TAKE 1 TABLET ON MONDAY, WEDNESDAY, AND FRIDAY Shelah Lamar RAMAN, MD  Active Child, Self, Pharmacy Records  Med List Note Leobardo Garre, CPhT 08/28/23 0130): PATIENT HAS DIFFICULTY SWALLOWING LARGE PILLS; PLEASE BREAK IN HALF. All local 30 day or less are filled at [facility] on [address].           Recommendation:   PCP Follow-up- as scheduled 09/18/23 Specialty provider follow-up- as scheduled:  [facility]/  pulmonary Continue Current Plan of Care  Follow Up Plan:   Telephone follow-up in 1 week- as scheduled 09/19/23  Pls call/ message for questions,  Sammy Douthitt Mckinney Deniesha Stenglein, RN, BSN, CCRN Alumnus RN Care Manager  Transitions of Care  [facility] - [facility] [phone]: direct office

## 2023-09-12 NOTE — Patient Instructions (Signed)
 Visit Information  Thank you for taking time to visit with me today. Please don't hesitate to contact me if I can be of assistance to you before our next scheduled telephone appointment.  Our next appointment is by telephone on Thursday 09/19/23 at 9:15 am  Please call the care guide team at [phone] if you need to cancel or reschedule your appointment.   Following are the goals we discussed today:  Patient Self Care Activities:  Attend all scheduled provider appointments Call pharmacy for medication refills 3-7 days in advance of running out of medications Call provider office for new concerns or questions  Participate in Transition of Care Program/Attend TOC scheduled calls Take medications as prescribed   Continue ongoing monitoring of BP, weight, and blood sugar reporting readings outside of provider established parameters Continue to follow your established action plan for episodes of shortness of breath: maintenance inhaler/ rescue inhaler as needed; rest; use of home oxygen  Continue to monitor all bowel movements for signs of bleeding- if signs occur: please contact your care providers immediately or seek emergency/ urgent care as needed If you believe your condition is getting worse- contact your care providers (doctors) promptly- reaching out to your doctor early when you have concerns can prevent you from having to go to the hospital  If you are experiencing a Mental Health or Behavioral Health Crisis or need someone to talk to, please  call the Suicide and Crisis Lifeline: 988 call the [place]  National Suicide Prevention Lifeline: 609-465-1106 or TTY: 657-358-3695 TTY 8675956689) to talk to a trained counselor call 1-800-273-TALK (toll free, 24 hour hotline) go to [facility] [address], [place] [phone]) call the [facility] Crisis Line: [phone] call 911   Patient's caregiver verbalizes understanding of  instructions and care plan provided today and agrees to view in MyChart. Active MyChart status and caregiver understanding of how to access instructions and care plan via MyChart confirmed with patient.     Onyinyechi Huante Mckinney Jeury Mcnab, RN, BSN, Media planner  Transitions of Care  VBCI - [facility] [phone]: direct office

## 2023-09-17 NOTE — Progress Notes (Unsigned)
 Subjective:    Patient ID: Brian [name], male    DOB: 08-26-1931, 89 y.o.   MRN: [id number]     HPI Brian [name] is here for follow up from the hospital.   Admitted 7/8 - 7/14 for lower GI bleed  He was admitted for rectal bleeding/blood loss anemia.  Work up revealed cecal AVMs.  He had an RBC scan that was positive.  Colonoscopy reveal cecal AVMs that was treated by GI w/ argon plasma coagulation and placement of clip.  H/H was stable.  To follow up with GI and colorectal surgery.  Acute blood loss anemia -  Chronic anemia at baseline  Vascular dementia -  On namenda   HFpEF -  No signs of acute exacerbation  Continue lasix  40 mg daily  CKD, stage 4 -  Stable  COPD -  Stable  Type 2 DM w/ hld -  Insulin  SS Statin  Htn   -  Stable with SBP 140's  CAD -  No chest pain A/c held due to GIB, anemia  Lower GI bleed, cecal angiodysplastic lesions  Medications and allergies reviewed with patient and updated if appropriate.  Current Outpatient Medications on File Prior to Visit  Medication Sig Dispense Refill   albuterol  (VENTOLIN  HFA) 108 (90 Base) MCG/ACT inhaler Inhale 2 puffs into the lungs every 6 (six) hours as needed for wheezing or shortness of breath. 18 g 0   ANORO ELLIPTA  62.5-25 MCG/ACT AEPB USE 1 INHALATION DAILY 240 each 3   Cholecalciferol  (VITAMIN D3) 50 MCG (2000 UT) capsule Take 2,000 Units by mouth daily.     Cyanocobalamin  1000 MCG CAPS Take 1,000 mcg by mouth daily.     ferrous sulfate  325 (65 FE) MG tablet Take 1 tablet (325 mg total) by mouth daily with breakfast. 90 tablet 1   furosemide  (LASIX ) 40 MG tablet Take 1 tablet (40 mg total) by mouth daily. 15 tablet 0   memantine  (NAMENDA ) 5 MG tablet Take 1 tablet (5 mg total) by mouth 2 (two) times daily. 60 tablet 0   mirtazapine  (REMERON ) 15 MG tablet TAKE 1 TABLET(15 MG) BY MOUTH AT BEDTIME 90 tablet 1   predniSONE  (DELTASONE ) 10 MG tablet Take 1 tablet (10 mg total) by mouth 2 (two) times  daily with a meal. 30 tablet 0   simvastatin  (ZOCOR ) 40 MG tablet Take 1 tablet (40 mg total) by mouth daily. 90 tablet 3   sulfamethoxazole -trimethoprim  (BACTRIM  DS) 800-160 MG tablet TAKE 1 TABLET BY MOUTH AS A SINGLE DOSE. TAKE 1 TABLET ON MONDAY, WEDNESDAY, AND FRIDAY 12 tablet 5   No current facility-administered medications on file prior to visit.     Review of Systems     Objective:  There were no vitals filed for this visit. BP Readings from Last 3 Encounters:  09/02/23 (!) 155/63  05/22/23 (!) 158/93  05/02/23 (!) 163/79   Wt Readings from Last 3 Encounters:  09/12/23 167 lb (75.8 kg)  09/03/23 167 lb (75.8 kg)  08/27/23 167 lb 8.8 oz (76 kg)   There is no height or weight on file to calculate BMI.    Physical Exam     Lab Results  Component Value Date   WBC 8.6 09/02/2023   HGB 8.3 (L) 09/02/2023   HCT 26.3 (L) 09/02/2023   PLT 128 (L) 09/02/2023   GLUCOSE 130 (H) 08/29/2023   CHOL 163 04/01/2023   TRIG 134.0 04/01/2023   HDL 68.20 04/01/2023   LDLCALC  68 04/01/2023   ALT 15 08/27/2023   AST 16 08/27/2023   NA 143 08/29/2023   K 4.9 08/29/2023   CL 102 08/29/2023   CREATININE 2.37 (H) 08/29/2023   BUN 49 (H) 08/29/2023   CO2 32 08/29/2023   TSH 1.24 09/28/2022   PSA 1.42 07/16/2007   INR 1.2 01/21/2019   HGBA1C 7.1 (H) 08/28/2023   MICROALBUR 9.45 03/19/2018     Assessment & Plan:    See Problem List for Assessment and Plan of chronic medical problems.

## 2023-09-18 ENCOUNTER — Ambulatory Visit: Admitting: Internal Medicine

## 2023-09-18 ENCOUNTER — Encounter: Payer: Self-pay | Admitting: Internal Medicine

## 2023-09-18 VITALS — BP 136/60 | HR 69 | Temp 98.1°F | Ht 71.0 in | Wt 151.4 lb

## 2023-09-18 DIAGNOSIS — N184 Chronic kidney disease, stage 4 (severe): Secondary | ICD-10-CM

## 2023-09-18 DIAGNOSIS — I2510 Atherosclerotic heart disease of native coronary artery without angina pectoris: Secondary | ICD-10-CM | POA: Diagnosis not present

## 2023-09-18 DIAGNOSIS — E785 Hyperlipidemia, unspecified: Secondary | ICD-10-CM | POA: Diagnosis not present

## 2023-09-18 DIAGNOSIS — E782 Mixed hyperlipidemia: Secondary | ICD-10-CM | POA: Diagnosis not present

## 2023-09-18 DIAGNOSIS — K922 Gastrointestinal hemorrhage, unspecified: Secondary | ICD-10-CM | POA: Diagnosis not present

## 2023-09-18 DIAGNOSIS — I5032 Chronic diastolic (congestive) heart failure: Secondary | ICD-10-CM | POA: Diagnosis not present

## 2023-09-18 DIAGNOSIS — I10 Essential (primary) hypertension: Secondary | ICD-10-CM

## 2023-09-18 DIAGNOSIS — E1169 Type 2 diabetes mellitus with other specified complication: Secondary | ICD-10-CM

## 2023-09-18 LAB — CBC WITH DIFFERENTIAL/PLATELET
Basophils Absolute: 0 K/uL (ref 0.0–0.1)
Basophils Relative: 0.1 % (ref 0.0–3.0)
Eosinophils Absolute: 0 K/uL (ref 0.0–0.7)
Eosinophils Relative: 0.1 % (ref 0.0–5.0)
HCT: 31.8 % — ABNORMAL LOW (ref 39.0–52.0)
Hemoglobin: 10.3 g/dL — ABNORMAL LOW (ref 13.0–17.0)
Lymphocytes Relative: 3.7 % — ABNORMAL LOW (ref 12.0–46.0)
Lymphs Abs: 0.4 K/uL — ABNORMAL LOW (ref 0.7–4.0)
MCHC: 32.2 g/dL (ref 30.0–36.0)
MCV: 95.5 fl (ref 78.0–100.0)
Monocytes Absolute: 0.6 K/uL (ref 0.1–1.0)
Monocytes Relative: 5.1 % (ref 3.0–12.0)
Neutro Abs: 10.6 K/uL — ABNORMAL HIGH (ref 1.4–7.7)
Neutrophils Relative %: 91 % — ABNORMAL HIGH (ref 43.0–77.0)
Platelets: 185 K/uL (ref 150.0–400.0)
RBC: 3.33 Mil/uL — ABNORMAL LOW (ref 4.22–5.81)
RDW: 15.2 % (ref 11.5–15.5)
WBC: 11.7 K/uL — ABNORMAL HIGH (ref 4.0–10.5)

## 2023-09-18 LAB — BASIC METABOLIC PANEL WITH GFR
BUN: 46 mg/dL — ABNORMAL HIGH (ref 6–23)
CO2: 32 meq/L (ref 19–32)
Calcium: 9.4 mg/dL (ref 8.4–10.5)
Chloride: 101 meq/L (ref 96–112)
Creatinine, Ser: 3 mg/dL — ABNORMAL HIGH (ref 0.40–1.50)
GFR: 17.84 mL/min — ABNORMAL LOW (ref >60.00)
Glucose, Bld: 251 mg/dL — ABNORMAL HIGH (ref 70–99)
Potassium: 5.1 meq/L (ref 3.5–5.1)
Sodium: 143 meq/L (ref 135–145)

## 2023-09-18 NOTE — Assessment & Plan Note (Signed)
 Chronic With CKD  Lab Results  Component Value Date   HGBA1C 7.1 (H) 08/28/2023   Sugars controlled Continue lifestyle control

## 2023-09-18 NOTE — Patient Instructions (Addendum)
      Blood work was ordered.       Medications changes include :   None      Return for cancel august  and april appt -- schedule February appt - follow up.

## 2023-09-18 NOTE — Assessment & Plan Note (Signed)
 Chronic GFR stable Following with nephrology-Dr. Rommie an appointment next month He does appear to be euvolemic, but slightly concerned he may be too dry BMP Continue Lasix  40 mg daily

## 2023-09-18 NOTE — Assessment & Plan Note (Signed)
 Recent hospitalization for acute GI bleed-cecal AVMs, which were treated during colonoscopy with argon plasma coagulation and placement of clip H&H stable and hospital Denies any symptoms consistent with active bleed today Will recheck CBC Aspirin  held-will continue to hold

## 2023-09-18 NOTE — Assessment & Plan Note (Signed)
 Chronic Blood pressure well-controlled  Only on Lasix  40 mg daily Monitor BP at home

## 2023-09-18 NOTE — Assessment & Plan Note (Signed)
 Chronic Following with cardiology Denies any chest pain Aspirin  was discontinued because of GI bleed Continue  Zocor  40 mg daily

## 2023-09-18 NOTE — Assessment & Plan Note (Signed)
Chronic  Continue simvastatin 40 mg daily  

## 2023-09-18 NOTE — Assessment & Plan Note (Signed)
 Chronic Appears euvolemic Following with cardiology Continue furosemide  40 mg daily BMP

## 2023-09-19 ENCOUNTER — Ambulatory Visit: Payer: Self-pay | Admitting: Internal Medicine

## 2023-09-19 ENCOUNTER — Other Ambulatory Visit: Payer: Self-pay | Admitting: *Deleted

## 2023-09-19 NOTE — Patient Instructions (Signed)
 Visit Information  Thank you for taking time to visit with me today. Please don't hesitate to contact me if I can be of assistance to you before our next scheduled telephone appointment.  Our next appointment is by telephone on Friday September 27, 2023 at 9:30 am  Please call the care guide team at [phone] if you need to cancel or reschedule your appointment.   Following are the goals we discussed today:  Patient Self Care Activities:  Attend all scheduled provider appointments Call pharmacy for medication refills 3-7 days in advance of running out of medications Call provider office for new concerns or questions  Participate in Transition of Care Program/Attend TOC scheduled calls Take medications as prescribed   Continue ongoing monitoring of BP, weight, and blood sugar reporting readings outside of provider established parameters Continue to follow your established action plan for episodes of shortness of breath: maintenance inhaler/ rescue inhaler as needed; rest; use of home oxygen  Continue to monitor all bowel movements for signs of bleeding- if signs occur: please contact your care providers immediately or seek emergency/ urgent care as needed If you believe your condition is getting worse- contact your care providers (doctors) promptly- reaching out to your doctor early when you have concerns can prevent you from having to go to the hospital  If you are experiencing a Mental Health or Behavioral Health Crisis or need someone to talk to, please  call the Suicide and Crisis Lifeline: 988 call the [place]  National Suicide Prevention Lifeline: (610) 790-1031 or TTY: 618-370-3737 TTY 614-676-3657) to talk to a trained counselor call 1-800-273-TALK (toll free, 24 hour hotline) go to [facility] [address] [place] [phone]) call the [facility] Crisis Line: [phone] call 911   Caregiver verbalizes understanding of instructions  and care plan provided today and agrees to view in MyChart. Active MyChart status and patient understanding of how to access instructions and care plan via MyChart confirmed with patient.     Thiago Ragsdale Mckinney Tieshia Rettinger, RN, BSN, Media planner  Transitions of Care  [facility] - [facility] [phone]: direct office

## 2023-09-19 NOTE — Transitions of Care (Post Inpatient/ED Visit) (Signed)
 Transition of Care week 3/ day # 16  Visit Note  09/19/2023  Name: Brian [name] MRN: [id number]          DOB: 1931/12/26  Situation: Patient enrolled in [facility] 30-day program. Visit completed with patient's daughter/ caregiver [name]- verified on [facility] DPR by telephone  HIPAA identifiers x 2 verified  Background:  Recent hospitalization July 8-14, 2025 for GI bleeding: symptomatic anemia secondary to cecal AVM (1) unplanned hospital admission x last (12) and (6) months   Initial Transition Care Management Follow-up Telephone Call    Past Medical History:  Diagnosis Date   AAA (abdominal aortic aneurysm) (HCC)    Arthritis    Asthma 1976   Bronchiolitis 06/2014   CAD (coronary artery disease)    NSTEMI 02/2010;  s/p CABG 1/12 (L-LAD, SOM1, S-PDA); echo in 08/2010: Mild LVH, EF 55%, grade 2 diastolic dysfunction, mild MR, mild LAE, mildly reduced RV function, mild RAE, PASP 42   Carpal tunnel syndrome of left wrist    Cecal angiodysplasia 04/04/2022   Colon polyps    COPD (chronic obstructive pulmonary disease) (HCC)    Diabetes mellitus 2000   Type II, controlls with diet   Dyspnea    ED (erectile dysfunction)    Gout    History of radiation therapy 03/09/2020-03/16/2020   SBRT right lung: Dr. Lynwood Nasuti   HTN (hypertension)    Hyperlipidemia    Inguinal hernia    Right   Macular degeneration    Myocardial infarction [facility]) 1 -13-2012   Neuropathy    Assessment:  Per caregiver/ daughter [name]: He is still doing so much better-- he is very engaged and calm; Dr. Geofm was very impressed with how well he is doing when we saw her yesterday.  I have no concerns and am just so happy he is better    Caregiver denies clinical concerns throughout [facility] 30-day program outreach call today  Patient Reported Symptoms: Cognitive Cognitive Status: Alert and oriented to person, place, and time, Normal speech and language skills, Insightful and able to interpret abstract concepts,  Requires Assistance Decision Making (per daughter/ caregiver) Cognitive/Intellectual Conditions Management [RPT]: Other Other: Baseline vascular dementia      Neurological Neurological Review of Symptoms: No symptoms reported (per daughter/ caregiver) Neurological Management Strategies: Routine screening, Coping strategies, Medication therapy  HEENT HEENT Symptoms Reported: No symptoms reported (per daughter/ caregiver)      Cardiovascular Cardiovascular Symptoms Reported: No symptoms reported (per daughter/ caregiver) Does patient have uncontrolled Hypertension?: No Cardiovascular Management Strategies: Medication therapy, Routine screening, Coping strategies, Adequate rest Weight: 151 lb (68.5 kg) (per daughter/ caregiver: weight reflects value from PCP office visit 09/18/23)  Respiratory Respiratory Symptoms Reported: Shortness of breath Other Respiratory Symptoms: per daughter/ caregiver: baseline shortness of breath: He is breathing fine and his stamina is so much better; caregiver denies concerns around breathing status today and denies recent episodes of unmanageable shortness of breath; basic plan of action for shortness of breath reinforced- caregiver continues to verbalize excellent understanding of same without prommpting Additional Respiratory Details: Confirmed per daughter/ caregiver: continues to use home O2 as per baseline Respiratory Management Strategies: Oxygen  therapy, Adequate rest, Coping strategies, Medication therapy, Routine screening  Endocrine Endocrine Symptoms Reported: No symptoms reported Is patient diabetic?: Yes Is patient checking blood sugars at home?: Yes List most recent blood sugar readings, include date and time of day: per daughter/ caregiver: they are all still good and Dr. Geofm was very happy with his labs; confirms  she continues to monitor blood sugars at home; is not near recorded home values today to review in detail    Gastrointestinal  Gastrointestinal Symptoms Reported: No symptoms reported (per daughter/ caregiver) Additional Gastrointestinal Details: per daughter/ caregiver: continues with very good appetite; confirms no issues around constipation: going regular for BM's, they are looking normal now; confirms no signs/ symptoms GI bleeding: reinforced same along with corresponding action plan: caregiver continues to verbalize excellent understanding of same without prompting Gastrointestinal Management Strategies: Coping strategies    Genitourinary Genitourinary Symptoms Reported: No symptoms reported Additional Genitourinary Details: per daughter/ caregiver: He is peeing fine, normally, in good amounts; we go see the kidney doctor soon in August- I can't remember the exact date right now, but it is scheduled and he will be going as scheduled Genitourinary Management Strategies: Coping strategies, Medication therapy  Integumentary Integumentary Symptoms Reported: No symptoms reported (per daughter/ caregiver)    Musculoskeletal Musculoskelatal Symptoms Reviewed: Limited mobility, Unsteady gait Additional Musculoskeletal Details: per daughter/ caregiver: continues using assistive devices as per baseline; she denies new/ recent falls Musculoskeletal Management Strategies: Routine screening, Medical device, Coping strategies      Psychosocial Psychosocial Symptoms Reported: No symptoms reported (per daughter/ caregiver: His spirits are excellent- better than it has been in a long time.  He seems so much more engaged this week, than he has in a long time)         There were no vitals filed for this visit.  Medications Reviewed Today     Reviewed by Gissell Barra M, RN (Registered Nurse) on 09/19/23 at [phone]  Med List Status: <None>   Medication Order Taking? Sig Documenting Provider Last Dose Status Informant  albuterol  (VENTOLIN  HFA) 108 (90 Base) MCG/ACT inhaler [id number]  Inhale 2 puffs into the lungs every 6  (six) hours as needed for wheezing or shortness of breath. Geofm Glade PARAS, MD  Active Child, Self, Pharmacy Records           Med Note (LEE, NICOLE   Wed Aug 28, 2023  1:13 AM) RESCUE INHALER; Last taken over 6 mos ago.  ANORO ELLIPTA  62.5-25 MCG/ACT AEPB [id number]  USE 1 INHALATION DAILY Burns, Glade PARAS, MD  Active Child, Self, Pharmacy Records  Cholecalciferol  (VITAMIN D3) 50 MCG (2000 UT) capsule [id number]  Take 2,000 Units by mouth daily. [provider]  Active Child, Self, Pharmacy Records  Cyanocobalamin  1000 MCG CAPS [id number]  Take 1,000 mcg by mouth daily. [provider]  Active Self, Child, Pharmacy Records  ferrous sulfate  325 (65 FE) MG tablet [id number]  Take 1 tablet (325 mg total) by mouth daily with breakfast. Geofm Glade PARAS, MD  Active Child, Self, Pharmacy Records  furosemide  (LASIX ) 40 MG tablet [id number]  Take 1 tablet (40 mg total) by mouth daily. Rosario Leatrice FERNS, MD  Active   memantine  (NAMENDA ) 5 MG tablet [id number]  Take 1 tablet (5 mg total) by mouth 2 (two) times daily. Rosario Leatrice I, MD  Active   mirtazapine  (REMERON ) 15 MG tablet [id number]  TAKE 1 TABLET(15 MG) BY MOUTH AT BEDTIME Burns, Glade PARAS, MD  Active Child, Self, Pharmacy Records  predniSONE  (DELTASONE ) 10 MG tablet [id number]  Take 1 tablet (10 mg total) by mouth 2 (two) times daily with a meal. Rosario Leatrice FERNS, MD  Active   simvastatin  (ZOCOR ) 40 MG tablet [id number]  Take 1 tablet (40 mg total) by mouth daily. Geofm Glade PARAS, MD  Active Child, Self, Pharmacy Records  sulfamethoxazole -trimethoprim  (BACTRIM   DS) 800-160 MG tablet [id number]  TAKE 1 TABLET BY MOUTH AS A SINGLE DOSE. TAKE 1 TABLET ON MONDAY, WEDNESDAY, AND FRIDAY Byrum, Lamar RAMAN, MD  Active Child, Self, Pharmacy Records  Med List Note Leobardo Garre, CPhT 08/28/23 0130): PATIENT HAS DIFFICULTY SWALLOWING LARGE PILLS; PLEASE BREAK IN HALF. All local 30 day or less are filled at [facility] on [address].            Recommendation:   Specialty provider follow-up: renal and pulmonary provider Continue Current Plan of Care  Follow Up Plan:   Telephone follow-up in 1 week- as scheduled 09/27/23  Pls call/ message for questions,  Jonte Shiller Mckinney Gale Hulse, RN, BSN, CCRN Alumnus RN Care Manager  Transitions of Care  [facility] - [facility] [phone]: direct office

## 2023-09-24 ENCOUNTER — Encounter: Payer: Self-pay | Admitting: Emergency Medicine

## 2023-09-27 ENCOUNTER — Other Ambulatory Visit: Payer: Self-pay | Admitting: *Deleted

## 2023-09-27 ENCOUNTER — Other Ambulatory Visit: Payer: Self-pay | Admitting: Emergency Medicine

## 2023-09-27 MED ORDER — PREDNISONE 20 MG PO TABS: 20.0000 mg | ORAL_TABLET | Freq: Every day | ORAL | 1 refills | Status: DC

## 2023-09-27 NOTE — Telephone Encounter (Unsigned)
 Copied from CRM #[id number]. Topic: Clinical - Prescription Issue >> Sep 27, 2023 10:02 AM Shona RAMAN wrote: Reason for CRM: patient daughter is calling to get patient prednisone  refilled. Dr byrum is the original authorizing doctor but when patient went to hospital, the attending doctor prescribed him prednisone . Prescription is pending, please call patient

## 2023-09-27 NOTE — Telephone Encounter (Signed)
 Per lov note Continue prednisone  20 mg once daily Sending rx

## 2023-09-27 NOTE — Addendum Note (Signed)
 Addended byBETHA FRIES, Zacory Fiola A on: 09/27/2023 10:05 AM   Modules accepted: Orders

## 2023-09-27 NOTE — Transitions of Care (Post Inpatient/ED Visit) (Signed)
 Transition of Care week 4/ day # 24  Visit Note  09/27/2023  Name: Brian [name] MRN: [id number]          DOB: 1931-03-27  Situation: Patient enrolled in [facility] 30-day program. Visit completed with patient's daughter/ caregiver [name]- verified on [facility] DPR by telephone.   HIPAA identifiers x 2 verified  Background:  Recent hospitalization July 8-14, 2025 for GI bleeding: symptomatic anemia secondary to cecal AVM (1) unplanned hospital admission x last (12) and (6) months   Initial Transition Care Management Follow-up Telephone Call    Past Medical History:  Diagnosis Date   AAA (abdominal aortic aneurysm) (HCC)    Arthritis    Asthma 1976   Bronchiolitis 06/2014   CAD (coronary artery disease)    NSTEMI 02/2010;  s/p CABG 1/12 (L-LAD, SOM1, S-PDA); echo in 08/2010: Mild LVH, EF 55%, grade 2 diastolic dysfunction, mild MR, mild LAE, mildly reduced RV function, mild RAE, PASP 42   Carpal tunnel syndrome of left wrist    Cecal angiodysplasia 04/04/2022   Colon polyps    COPD (chronic obstructive pulmonary disease) (HCC)    Diabetes mellitus 2000   Type II, controlls with diet   Dyspnea    ED (erectile dysfunction)    Gout    History of radiation therapy 03/09/2020-03/16/2020   SBRT right lung: Dr. Lynwood Nasuti   HTN (hypertension)    Hyperlipidemia    Inguinal hernia    Right   Macular degeneration    Myocardial infarction [facility]) 1 -13-2012   Neuropathy    Assessment:  caregiver/ daughter [name]: He is still doing so good-- he is in high spirits and feels good; eating good, pooping normally, no signs of blood in his poop- I check after every single trip to the bathroom.  He will be attending the pulmonary visit and the kidney visit at the end of the month; still no concerns and am just so happy he is better: no falls, no episodes of shortness of breath, wearing his home oxygen  and eating good    Caregiver denies clinical concerns throughout [facility] 30-day program outreach  call today  Patient Reported Symptoms: Cognitive Cognitive Status: Alert and oriented to person, place, and time, Struggling with memory recall, Requires Assistance Decision Making, Normal speech and language skills, Insightful and able to interpret abstract concepts (per daughter/ caregiver) Cognitive/Intellectual Conditions Management [RPT]: Other Other: Baseline vascular dementia: daughter continues to manage all aspects of health care management and affairs/ daily care needs      Neurological Neurological Review of Symptoms: No symptoms reported (per daughter/ caregiver) Neurological Management Strategies: Medication therapy, Routine screening, Coping strategies Neurological Comment: Daughter continues to manage and handle all aspects of health care management/ affairs  HEENT HEENT Symptoms Reported: No symptoms reported (per daughter/ caregiver)      Cardiovascular Cardiovascular Symptoms Reported: No symptoms reported (per daughter/ caregiver) Does patient have uncontrolled Hypertension?: No Cardiovascular Management Strategies: Medication therapy, Routine screening, Coping strategies, Adequate rest Weight: 149 lb 3.2 oz (67.7 kg) (home reported value from 09/27/23)  Respiratory Respiratory Symptoms Reported: No symptoms reported (per daughter/ caregiver) Other Respiratory Symptoms: per daughter/ caregiver: Breathing fine, no episodes shortness of breath outside of his normal- he recovers within minutes if he ever gets winded.  Using his oxygen  all the time at 4 L/min all the time  Caregiver continues to verbalize without prompting excellent ongoing understanding of action plan at home for episodes shortness of breath; confirms plans to attend upcoming pulmonary  provider office visit as scheduled 10/10/23 Respiratory Management Strategies: Oxygen  therapy, Routine screening, Medication therapy, Adequate rest, Coping strategies  Endocrine Endocrine Symptoms Reported: No symptoms reported Is  patient diabetic?: Yes Is patient checking blood sugars at home?: Yes List most recent blood sugar readings, include date and time of day: per daughter/ caregiver: Sugars are running great: we check the fasting sugar every morning- it is always between 120-130  Reinforced blood sugar management at home; need to follow carbohydrate modified diet    Gastrointestinal Gastrointestinal Symptoms Reported: No symptoms reported Additional Gastrointestinal Details: Per daughter/ caregiver: No signs of any blood in his poop- He is having normal and regular bowel movements and I check behind him after every trip to the bathroom  Reinforced signs/ symptoms GI bleeding with caregiver along with corresponding action plan- she continues to verbalize excellent understanding of same without prompting Gastrointestinal Management Strategies: Coping strategies    Genitourinary Genitourinary Symptoms Reported: No symptoms reported Additional Genitourinary Details: Per daughter/ caregiver: Peeing fine in good amount; he goes to the kidney doctor in late August; his urine is still clear pale yellow in color Genitourinary Management Strategies: Medication therapy, Coping strategies  Integumentary Integumentary Symptoms Reported: No symptoms reported (Per daughter/ caregiver:) Additional Integumentary Details: Per daughter/ caregiver: she monitors his skin integrity regular Skin Management Strategies: Coping strategies, Routine screening  Musculoskeletal Musculoskelatal Symptoms Reviewed: Limited mobility, Unsteady gait, Difficulty walking Additional Musculoskeletal Details: Per daughter/ caregiver: continues using cane/ walker prn- she confirms no new/ recent falls; provided education/ reinforcement around fall prevention- caregiver continues to verbalize excellent understanding of same without prompting Musculoskeletal Management Strategies: Routine screening, Medical device, Coping strategies      Psychosocial  Psychosocial Symptoms Reported: No symptoms reported (Per daughter/ caregiver: He is in very good spirits)         Vitals:   09/27/23 0952  BP: 135/70    Medications Reviewed Today     Reviewed by Maylie Ashton M, RN (Registered Nurse) on 09/27/23 at [phone]  Med List Status: <None>   Medication Order Taking? Sig Documenting Provider Last Dose Status Informant  albuterol  (VENTOLIN  HFA) 108 (90 Base) MCG/ACT inhaler [id number]  Inhale 2 puffs into the lungs every 6 (six) hours as needed for wheezing or shortness of breath. Geofm Glade PARAS, MD  Active Child, Self, Pharmacy Records           Med Note (LEE, NICOLE   Wed Aug 28, 2023  1:13 AM) RESCUE INHALER; Last taken over 6 mos ago.  ANORO ELLIPTA  62.5-25 MCG/ACT AEPB [id number]  USE 1 INHALATION DAILY Burns, Glade PARAS, MD  Active Child, Self, Pharmacy Records  Cholecalciferol  (VITAMIN D3) 50 MCG (2000 UT) capsule [id number]  Take 2,000 Units by mouth daily. [provider]  Active Child, Self, Pharmacy Records  Cyanocobalamin  1000 MCG CAPS [id number]  Take 1,000 mcg by mouth daily. [provider]  Active Self, Child, Pharmacy Records  ferrous sulfate  325 (65 FE) MG tablet [id number]  Take 1 tablet (325 mg total) by mouth daily with breakfast. Geofm Glade PARAS, MD  Active Child, Self, Pharmacy Records  furosemide  (LASIX ) 40 MG tablet [id number]  Take 1 tablet (40 mg total) by mouth daily. Rosario Eland I, MD  Active   memantine  (NAMENDA ) 5 MG tablet [id number]  Take 1 tablet (5 mg total) by mouth 2 (two) times daily. Rosario Eland I, MD  Active   mirtazapine  (REMERON ) 15 MG tablet [id number]  TAKE 1 TABLET(15 MG) BY MOUTH AT BEDTIME Burns,  Glade PARAS, MD  Active Child, Self, Pharmacy Records  predniSONE  (DELTASONE ) 10 MG tablet [id number]  Take 1 tablet (10 mg total) by mouth 2 (two) times daily with a meal. Rosario Leatrice FERNS, MD  Active   simvastatin  (ZOCOR ) 40 MG tablet [id number]  Take 1 tablet (40 mg total) by mouth daily. Geofm Glade PARAS, MD  Active Child, Self, Pharmacy Records  sulfamethoxazole -trimethoprim  (BACTRIM  DS) 800-160 MG tablet [id number]  TAKE 1 TABLET BY MOUTH AS A SINGLE DOSE. TAKE 1 TABLET ON MONDAY, WEDNESDAY, AND FRIDAY Shelah Lamar RAMAN, MD  Active Child, Self, Pharmacy Records  Med List Note Leobardo Garre, CPhT 08/28/23 0130): PATIENT HAS DIFFICULTY SWALLOWING LARGE PILLS; PLEASE BREAK IN HALF. All local 30 day or less are filled at [facility] on [address].           Recommendation:   Specialty provider follow-up- as scheduled with pulmonary and renal providers Continue Current Plan of Care  Follow Up Plan:   Telephone follow-up in 1 week- as scheduled 10/03/23: for [facility] 30-day program case closure with transfer to longitudinal RN CM if no hospital re-admission  Pls call/ message for questions,  Noa Galvao Mckinney Rossetta Kama, RN, BSN, CCRN Alumnus RN Care Manager  Transitions of Care  [facility]BCI - [facility] [phone]: direct office

## 2023-09-27 NOTE — Patient Instructions (Signed)
 Visit Information  Thank you for taking time to visit with me today. Please don't hesitate to contact me if I can be of assistance to you before our next scheduled telephone appointment.  Our next appointment is by telephone on Thursday, 10/03/23 at 9:30 am  Please call the care guide team at [phone] if you need to cancel or reschedule your appointment.   Following are the goals we discussed today:  Patient Self Care Activities:  Attend all scheduled provider appointments Call pharmacy for medication refills 3-7 days in advance of running out of medications Call provider office for new concerns or questions  Participate in Transition of Care Program/Attend TOC scheduled calls Take medications as prescribed   Continue ongoing monitoring of BP, weight, and blood sugar reporting readings outside of provider established parameters Continue to follow your established action plan for episodes of shortness of breath: maintenance inhaler/ rescue inhaler as needed; rest; use of home oxygen  Continue to monitor all bowel movements for signs of bleeding- if signs occur: please contact your care providers immediately or seek emergency/ urgent care as needed If you believe your condition is getting worse- contact your care providers (doctors) promptly- reaching out to your doctor early when you have concerns can prevent you from having to go to the hospital  If you are experiencing a Mental Health or Behavioral Health Crisis or need someone to talk to, please  call the Suicide and Crisis Lifeline: 988 call the [place]  National Suicide Prevention Lifeline: 9597296410 or TTY: 401-075-8875 TTY 709-857-4327) to talk to a trained counselor call 1-800-273-TALK (toll free, 24 hour hotline) go to [facility] [address] [place] [phone]) call the [facility] Crisis Line: [phone]   Patient's caregiver verbalizes understanding of instructions and  care plan provided today and agrees to view in MyChart. Active MyChart status and patient understanding of how to access instructions and care plan via MyChart confirmed with patient.     Jaimie Redditt Mckinney Tayden Duran, RN, BSN, Media planner  Transitions of Care  [facility]BCI - [facility] [phone]: direct office

## 2023-09-30 ENCOUNTER — Other Ambulatory Visit: Payer: Self-pay | Admitting: Internal Medicine

## 2023-10-01 ENCOUNTER — Ambulatory Visit: Payer: Medicare Other | Admitting: Internal Medicine

## 2023-10-03 ENCOUNTER — Other Ambulatory Visit: Payer: Self-pay | Admitting: *Deleted

## 2023-10-03 VITALS — Wt 149.8 lb

## 2023-10-03 DIAGNOSIS — J439 Emphysema, unspecified: Secondary | ICD-10-CM

## 2023-10-03 DIAGNOSIS — E1169 Type 2 diabetes mellitus with other specified complication: Secondary | ICD-10-CM

## 2023-10-03 DIAGNOSIS — K922 Gastrointestinal hemorrhage, unspecified: Secondary | ICD-10-CM

## 2023-10-03 DIAGNOSIS — I259 Chronic ischemic heart disease, unspecified: Secondary | ICD-10-CM

## 2023-10-03 DIAGNOSIS — N184 Chronic kidney disease, stage 4 (severe): Secondary | ICD-10-CM

## 2023-10-03 NOTE — Patient Instructions (Signed)
 Visit Information  Thank you for taking time to visit with me today. Please don't hesitate to contact me if I can be of assistance to you before our next scheduled telephone appointment.  It has been a pleasure working with you over the last 30 days!  Great job managing your care after your hospital visit!  I am glad that you are doing well!  Please listen for a call from the scheduling care guide to schedule a phone call with the new nurse care manager who will pick up in your care where we are leaving off today  Following are the goals we discussed today:  Patient Self Care Activities:  Attend all scheduled provider appointments Call pharmacy for medication refills 3-7 days in advance of running out of medications Call provider office for new concerns or questions  Take medications as prescribed   Continue ongoing monitoring of BP, weight, and blood sugar reporting readings outside of provider established parameters Continue to follow your established action plan for episodes of shortness of breath: maintenance inhaler/ rescue inhaler as needed; rest; use of home oxygen  Continue to monitor all bowel movements for signs of bleeding- if signs occur: please contact your care providers immediately or seek emergency/ urgent care as needed If you believe your condition is getting worse- contact your care providers (doctors) promptly- reaching out to your doctor early when you have concerns can prevent you from having to go to the hospital  If you are experiencing a Mental Health or Behavioral Health Crisis or need someone to talk to, please  call the Suicide and Crisis Lifeline: 988 call the [place]  National Suicide Prevention Lifeline: 3308712487 or TTY: 616 205 9878 TTY 720 576 7817) to talk to a trained counselor call 1-800-273-TALK (toll free, 24 hour hotline) go to [facility] [address], [place] [phone]) call the [facility] Crisis  Line: (469) 400-3390 call 911   Patient's daughter/ caregiver verbalizes understanding of instructions and care plan provided today and agrees to view in MyChart. Active MyChart status and patient understanding of how to access instructions and care plan via MyChart confirmed with patient.     Brian Ordaz Mckinney Quintasha Gren, RN, BSN, Media planner  Transitions of Care  [facility] - [facility] [phone]: direct office

## 2023-10-03 NOTE — Telephone Encounter (Signed)
 Nfn closing encounter

## 2023-10-03 NOTE — Transitions of Care (Post Inpatient/ED Visit) (Signed)
 Transition of Care week # 5/ day # 30- TOC 30-day program case closure  Visit Note  10/03/2023  Name: Brian [name] MRN: [id number]          DOB: 10-Mar-1931  Situation: Patient enrolled in [facility] 30-day program. Visit completed with patient's daughter/ caregiver [name]- verified on [facility] DPR by telephone.   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 (1) unplanned hospital admission x last (12) and (6) months   Initial Transition Care Management Follow-up Telephone Call    Past Medical History:  Diagnosis Date   AAA (abdominal aortic aneurysm) (HCC)    Arthritis    Asthma 1976   Bronchiolitis 06/2014   CAD (coronary artery disease)    NSTEMI 02/2010;  s/p CABG 1/12 (L-LAD, SOM1, S-PDA); echo in 08/2010: Mild LVH, EF 55%, grade 2 diastolic dysfunction, mild MR, mild LAE, mildly reduced RV function, mild RAE, PASP 42   Carpal tunnel syndrome of left wrist    Cecal angiodysplasia 04/04/2022   Colon polyps    COPD (chronic obstructive pulmonary disease) (HCC)    Diabetes mellitus 2000   Type II, controlls with diet   Dyspnea    ED (erectile dysfunction)    Gout    History of radiation therapy 03/09/2020-03/16/2020   SBRT right lung: Dr. Lynwood Nasuti   HTN (hypertension)    Hyperlipidemia    Inguinal hernia    Right   Macular degeneration    Myocardial infarction [facility]) 1 -13-2012   Neuropathy    Assessment:  caregiver/ daughter [name]: He is still doing great-- no issues at all; eating good, pooping good with no signs of bleeding; taking his medications, and all of his numbers for blood sugars, daily weights have not changed- they are all stable and in  his normal range; breathing fine using the home oxygen  at 4 L/min, has not had any episodes of shortness of breath that sitting down does not take care of    Caregiver denies clinical concerns throughout [facility] 30-day program outreach call today  Patient Reported Symptoms: Cognitive Cognitive Status: Alert and oriented to person, place, and time, Normal speech and language skills, Insightful and able to interpret abstract concepts, Requires Assistance Decision Making (per daughter/ caregiver [name]) Cognitive/Intellectual Conditions Management [RPT]: Other Other: Baseline vascular dementia: daughter - caregiver continues to manage all aspects of health care needs/ affairs      Neurological Neurological Review of Symptoms: Other: (per daughter/ caregiver Jackson) [place] Neurological Symptoms/Conditions [RPT]: per daughter/ caregiver [name]: baselines memory issues due to mild vasculat dementia Neurological Management Strategies: Routine screening, Coping strategies, Adequate rest, Medication therapy Neurological Comment: Daughter continues to manage all aspects of daily care management and health care affairs  HEENT HEENT Symptoms Reported: No symptoms reported (per daughter/ caregiver [name])      Cardiovascular Cardiovascular Symptoms Reported: No symptoms reported, Other: (per daughter/ caregiver [name]) Does patient have uncontrolled Hypertension?: No Cardiovascular Management Strategies: Routine screening, Medication therapy, Coping strategies, Adequate rest, Diet modification Weight: 149 lb 12.8 oz (67.9 kg) (home reported weight from 10/03/23)  Respiratory Respiratory Symptoms Reported: No symptoms reported Other Respiratory Symptoms: per daughter/ caregiver [name]: no recent episodes shortness of breath outside of baseline: continues to use  home O2 continously at 4 L/min; has not needed rescue inhaler recently; Caregiver continues able to verbalize action plan for shortness of breath  without prompting; continues to monitor/ record daily weights at home- reports daily weights in same range;  reinforced rationale for daily weight monitoring at home along with weight gain guidelines/ action plan for weight gain; importance of taking diuretic as prescribed - caregiver remains able to verbalize excellent understanding of same wihtout prompting Respiratory Management Strategies: Activity, Adequate rest, Coping strategies, Oxygen  therapy, Medication therapy, Routine screening, Asthma action plan  Endocrine Endocrine Symptoms Reported: No symptoms reported (rationale for daily weight monitoring at home along with weight gain guidelines/ action plan for weight gain; importance of taking diuretic as prescribed) Is patient diabetic?: Yes Is patient checking blood sugars at home?: Yes List most recent blood sugar readings, include date and time of day: per daughter/ caregiver: continues to monitor and record blood sugars at home; continues to report blood sugars consistently betwen 120-130 (fasting)  Reinforced blood sugar management at home; need to follow carbohydrate modified diet    Gastrointestinal Gastrointestinal Symptoms Reported: No symptoms reported Additional Gastrointestinal Details: per daughter/ caregiver: continues to report excellent appetite; denies signs/ symptoms GI bleeding- reports normal and regular bowel movements Reinforced signs/ symptoms GI bleeding along with corresponding action plan- daughter able to verbalize excellent understanding of same without prompting Gastrointestinal Management Strategies: Coping strategies    Genitourinary Genitourinary Symptoms Reported: No symptoms reported Additional Genitourinary Details: Per daughter/ caregiver: continues to deny concerns: peeing fine in good amount, urine is clear yellow Genitourinary Management Strategies: Medication therapy, Coping strategies  Integumentary Integumentary Symptoms Reported: No symptoms  reported (per daughter/ caregiver) Skin Management Strategies: Routine screening, Coping strategies  Musculoskeletal Musculoskelatal Symptoms Reviewed: Limited mobility, Unsteady gait Additional Musculoskeletal Details: per daughter/ caregiver- confirmed uses assistive devices on regular basis, at baseline -- walker/ cane: she continues to supervise all ambulation; she denies new/ recent falls Musculoskeletal Management Strategies: Routine screening, Medical device, Coping strategies Falls in the past year?: No Number of falls in past year: 1 or less Was there an injury with Fall?: No (N/A- no falls reported) Fall Risk Category Calculator: 0 Patient Fall Risk Level: Low Fall Risk Patient at Risk for Falls Due to: Impaired mobility, Impaired balance/gait Fall risk Follow up: Education provided, Falls prevention discussed  Psychosocial Psychosocial Symptoms Reported: No symptoms reported (per daughter/ caregiver)         There were no vitals filed for this visit.  Medications Reviewed Today     Reviewed by Jaekwon Mcclune M, RN (Registered Nurse) on 10/03/23 at [phone]  Med List Status: <None>   Medication Order Taking? Sig Documenting Provider Last Dose Status Informant  albuterol  (VENTOLIN  HFA) 108 (90 Base) MCG/ACT inhaler [id number]  Inhale 2 puffs into the lungs every 6 (six) hours as needed for wheezing or shortness of breath. Geofm Glade PARAS, MD  Active Child, Self, Pharmacy Records           Med Note (LEE, NICOLE   Wed Aug 28, 2023  1:13 AM) RESCUE INHALER; Last taken over 6 mos ago.  ANORO ELLIPTA  62.5-25 MCG/ACT AEPB [id number]  USE 1 INHALATION DAILY Burns, Glade PARAS, MD  Active   Cholecalciferol  (VITAMIN D3) 50 MCG (2000 UT) capsule [id number]  Take 2,000 Units by mouth daily. [provider]  Active Child, Self, Pharmacy Records  Cyanocobalamin  1000 MCG CAPS [id number]  Take 1,000 mcg by mouth daily. [provider]  Active Self, Child, Pharmacy  Records  ferrous sulfate   325 (65 FE) MG tablet [id number]  Take 1 tablet (325 mg total) by mouth daily with breakfast. Geofm Glade PARAS, MD  Active Child, Self, Pharmacy Records  furosemide  (LASIX ) 40 MG tablet [id number]  Take 1 tablet (40 mg total) by mouth daily. Rosario Leatrice FERNS, MD  Active   memantine  (NAMENDA ) 5 MG tablet [id number]  Take 1 tablet (5 mg total) by mouth 2 (two) times daily. Rosario Leatrice FERNS, MD  Active   mirtazapine  (REMERON ) 15 MG tablet [id number]  TAKE 1 TABLET(15 MG) BY MOUTH AT BEDTIME Burns, Glade PARAS, MD  Active Child, Self, Pharmacy Records  predniSONE  (DELTASONE ) 10 MG tablet [id number]  Take 1 tablet (10 mg total) by mouth 2 (two) times daily with a meal. Rosario Leatrice FERNS, MD  Active   predniSONE  (DELTASONE ) 20 MG tablet [id number]  Take 1 tablet (20 mg total) by mouth daily with breakfast. Shelah Lamar RAMAN, MD  Active   simvastatin  (ZOCOR ) 40 MG tablet [id number]  Take 1 tablet (40 mg total) by mouth daily. Geofm Glade PARAS, MD  Active Child, Self, Pharmacy Records  sulfamethoxazole -trimethoprim  (BACTRIM  DS) 800-160 MG tablet [id number]  TAKE 1 TABLET BY MOUTH AS A SINGLE DOSE. TAKE 1 TABLET ON MONDAY, WEDNESDAY, AND FRIDAY Shelah Lamar RAMAN, MD  Active Child, Self, Pharmacy Records  Med List Note Leobardo Garre, CPhT 08/28/23 0130): PATIENT HAS DIFFICULTY SWALLOWING LARGE PILLS; PLEASE BREAK IN HALF. All local 30 day or less are filled at [facility] on [address].           Recommendation:   Specialty provider follow-up- pulmonary provider 10/10/23- as scheduled Referral to: [facility] Longitudinal RN CM Continue Current Plan of Care  Follow Up Plan:   Referral to RN Case Manager Closing From:  Transitions of Care Program  Pls call/ message for questions,  Que Meneely Mckinney [address], RN, BSN, CCRN Alumnus RN Care Manager  Transitions of Care  [facility] - [facility] [phone]: direct office

## 2023-10-04 ENCOUNTER — Telehealth: Payer: Self-pay | Admitting: *Deleted

## 2023-10-04 ENCOUNTER — Encounter: Payer: Self-pay | Admitting: Internal Medicine

## 2023-10-04 NOTE — Progress Notes (Signed)
 Complex Care Management Note  Care Guide Note 10/04/2023 Name: Brian [name] MRN: [id number] DOB: Jun 15, 1931  Brian [name] is a 89 y.o. year old male who sees Burns, Glade PARAS, MD for primary care. I reached out to [employer] daughter [name] by phone today to offer complex care management services.  Brian [name] was given information about Complex Care Management services today including:   The Complex Care Management services include support from the care team which includes your Nurse Care Manager, Clinical Social Worker, or Pharmacist.  The Complex Care Management team is here to help remove barriers to the health concerns and goals most important to you. Complex Care Management services are voluntary, and the patient may decline or stop services at any time by request to their care team member.   Complex Care Management Consent Status: Patient agreed to services and verbal consent obtained.   Follow up plan:  Telephone appointment with complex care management team member scheduled for:  10/15/2023  Encounter Outcome:  Patient Scheduled  Thedford Franks, CMA [place]  [facility], [facility] Guide Direct Dial: [phone]  Fax: [phone] Website: [email or web address]

## 2023-10-07 MED ORDER — MIRTAZAPINE 15 MG PO TABS: 7.5000 mg | ORAL_TABLET | Freq: Every day | ORAL | Status: AC

## 2023-10-09 DIAGNOSIS — J449 Chronic obstructive pulmonary disease, unspecified: Secondary | ICD-10-CM | POA: Diagnosis not present

## 2023-10-09 DIAGNOSIS — N184 Chronic kidney disease, stage 4 (severe): Secondary | ICD-10-CM | POA: Diagnosis not present

## 2023-10-09 DIAGNOSIS — N189 Chronic kidney disease, unspecified: Secondary | ICD-10-CM | POA: Diagnosis not present

## 2023-10-09 DIAGNOSIS — E785 Hyperlipidemia, unspecified: Secondary | ICD-10-CM | POA: Diagnosis not present

## 2023-10-09 DIAGNOSIS — D631 Anemia in chronic kidney disease: Secondary | ICD-10-CM | POA: Diagnosis not present

## 2023-10-09 DIAGNOSIS — I129 Hypertensive chronic kidney disease with stage 1 through stage 4 chronic kidney disease, or unspecified chronic kidney disease: Secondary | ICD-10-CM | POA: Diagnosis not present

## 2023-10-09 DIAGNOSIS — C3490 Malignant neoplasm of unspecified part of unspecified bronchus or lung: Secondary | ICD-10-CM | POA: Diagnosis not present

## 2023-10-09 DIAGNOSIS — K922 Gastrointestinal hemorrhage, unspecified: Secondary | ICD-10-CM | POA: Diagnosis not present

## 2023-10-10 ENCOUNTER — Encounter: Payer: Self-pay | Admitting: Primary Care

## 2023-10-10 ENCOUNTER — Ambulatory Visit (INDEPENDENT_AMBULATORY_CARE_PROVIDER_SITE_OTHER): Admitting: Primary Care

## 2023-10-10 VITALS — BP 134/62 | HR 72 | Temp 97.9°F | Ht 71.0 in | Wt 158.0 lb

## 2023-10-10 DIAGNOSIS — I509 Heart failure, unspecified: Secondary | ICD-10-CM | POA: Diagnosis not present

## 2023-10-10 DIAGNOSIS — R918 Other nonspecific abnormal finding of lung field: Secondary | ICD-10-CM | POA: Diagnosis not present

## 2023-10-10 DIAGNOSIS — J439 Emphysema, unspecified: Secondary | ICD-10-CM

## 2023-10-10 DIAGNOSIS — J45909 Unspecified asthma, uncomplicated: Secondary | ICD-10-CM

## 2023-10-10 DIAGNOSIS — J9611 Chronic respiratory failure with hypoxia: Secondary | ICD-10-CM

## 2023-10-10 LAB — LAB REPORT - SCANNED: EGFR: 25

## 2023-10-10 MED ORDER — PREDNISONE 10 MG PO TABS
ORAL_TABLET | ORAL | 1 refills | Status: DC
Start: 2023-10-10 — End: 2023-10-30

## 2023-10-10 NOTE — Patient Instructions (Addendum)
    YOUR PLAN: -CHRONIC OBSTRUCTIVE PULMONARY DISEASE (COPD) WITH CHRONIC RESPIRATORY FAILURE: COPD is a chronic lung disease that makes it hard to breathe. Your condition is well-managed, and your oxygen  needs have decreased. You should continue using the Anoro inhaler once daily in the morning and reduce your prednisone  dosage gradually over the next few weeks. Continue using supplemental oxygen  at four liters, especially during sleep, and monitor your oxygen  levels at home.  -PULMONARY NODULES, STABLE ON SURVEILLANCE: Pulmonary nodules are small growths in the lungs. Your nodules are stable, and we will continue routine surveillance with CT scans to monitor them.  -HISTORY OF NON-SMALL CELL LUNG CANCER, STATUS POST RADIATION: Non-small cell lung cancer is a type of lung cancer that you have previously been treated for with radiation. Currently, there is no need for further radiation as the nodules are stable and presumed to be scar tissue. We will continue to monitor for any changes in your lung nodules.  INSTRUCTIONS: Please follow up with your nephrologist to monitor your blood levels and determine if a red blood cell booster is needed. Continue to monitor your oxygen  levels at home and follow the adjusted prednisone  dosage schedule. Ensure you stay hydrated and maintain your current medication regimen. Schedule your next CT scan as part of routine surveillance for your pulmonary nodules.   Rx: Take 1.5 tablets (15mg  total) daily with breakfast x 30 days, then 1 tablet (10mg ) daily with breakfast x 30 days; then stay on half tablet (5mg  total) daily with breakfast until follow-up    Follow-up Cancel 9/3 apt with Dr. Shelah and follow-up in 3 months

## 2023-10-10 NOTE — Progress Notes (Signed)
 @Patient  ID: Brian [name], male    DOB: Jul 06, 1931, 89 y.o.   MRN: [id number]  No chief complaint on file.   Referring provider: Geofm Glade PARAS, MD  HPI: 89 year old male, former smoker. PMH significant for CHF, HTN, AVM, hypertension, alzheimer's, COPD/asthma, pleural effusion, type 2 diabetes, dementia, CKD.    Previous [facility] pulmonary encounter:  ROV 11/08/2022 --[name] is a 76 with a history of very severe COPD.  He also underwent empiric SBRT to a right upper lobe pulmonary nodule that was suspicious for non-small cell lung cancer.  He has multifactorial dyspnea and hypoxemia, with contributions of his COPD, hypertension and CAD/CABG with diastolic and systolic CHF, chronic pleural effusions, diabetes, chronic renal insufficiency, prior GI bleeding started him on maintenance prednisone  (currently 15 mg) and he did get some benefit - he believes that his breathing is stable, able to ambulate, able to do some cooking.  His oxygen  is at 4-6 L/min. Remains on Anoro. Never needs his albuterol .  Since last time his serum creatinine and potassium were elevated and he had to come off enalapril . Last CT chest was 07/23/2022 > stable.   ROV 03/21/2023 --follow-up visit for very pleasant 89 year old gentleman with very severe COPD, empiric SBRT to a right upper lobe pulmonary nodule that was presumed to be non-small cell lung cancer.  He has hypoxemic respiratory failure due to this as well as history of hypertension, CAD/CABG with diastolic and systolic CHF, chronic effusions, chronic renal insufficiency, history of GI bleeding. He is currently managed on oxygen  4-6 L/min, Anoro, maintenance prednisone  20 mg daily.  Continues to follow closely with Dr. Shannon at radiation oncology. He continues to get around ok. He will get dyspnea w showering. Has to stop to rest w walking through the house. He is using a cane or a walker. The change to pred 20mg  has been beneficial - better breathing and appetite.  No cough or wheeze.  CT planned for March  CT scan of the chest 01/25/2023 reviewed by me shows trace right effusion, centrilobular and paraseptal emphysema with right upper lobe radiation changes.  This region appears stable.  Stable 5 mm left upper lobe nodule.  Slight enlargement of a lingular nodule from 4 mm to 7 mm    10/10/2023- Interim hx  Discussed the use of AI scribe software for clinical note transcription with the patient, who gave verbal consent to proceed.  History of Present Illness Brian [name] is an 89 year old male with COPD, chronic respiratory failure, and pulmonary nodules who presents for a hospital follow-up after a GI bleed. He was referred by the hospital for follow-up with all his doctors after a GI bleed.  He was recently hospitalized for a gastrointestinal bleed, experiencing three bleeds located in the cecum, which required blood transfusions. Post-hospitalization, he feels significantly better, describing himself as 'a whole new person.' He remains anemic, with hemoglobin levels between 10 and 11, and is no longer on aspirin . His nephrologist is monitoring his blood levels.  He has a history of COPD and chronic respiratory failure. Since his hospitalization, his oxygen  requirements have decreased from six liters to four liters. He uses Anoro inhaler once daily in the morning and has albuterol  as a rescue inhaler, which he has not needed to use. He is currently on 20 mg of prednisone  daily, which he reports is helping him feel well, with improved appetite and energy levels. He is also gaining back weight lost during his hospital  stay.  He has a history of presumed non-small cell lung cancer with an enlarging left-sided nodule, previously measured at seven millimeters. He has undergone radiation treatment and is being monitored for stable scattered lung nodules. A follow-up CT scan in September is scheduled.   He reports improved breathing, less snoring, and better  overall color and energy levels since the hospitalization. No swelling and improved breathing since hospitalization. He has not needed to use his rescue inhaler and is sleeping well.    Allergies  Allergen Reactions   Meperidine Hcl Swelling and Other (See Comments)    Tongue swelling Because of a history of documented adverse serious drug reaction, Medi Alert bracelet  is recommended.   Sertraline  Shortness Of Breath and Other (See Comments)    Burning sensation from Head to toe, diff breathing   Gabapentin  Other (See Comments)    Confusion    Memantine  Other (See Comments)    Can take only up to 5 mg a day- causes lethargy past that amount   Latex Rash and Other (See Comments)    Minor rash   Zolpidem Tartrate Other (See Comments)    Nightmares    Immunization History  Administered Date(s) Administered   Fluad Quad(high Dose 65+) 10/15/2018, 12/02/2019, 10/19/2020   Fluad Trivalent(High Dose 65+) 11/08/2022   Influenza Split 12/21/2010, 12/05/2011   Influenza Whole 01/01/2007, 12/09/2008, 11/24/2009   Influenza, High Dose Seasonal PF 11/08/2015, 11/22/2017, 11/13/2021   Influenza,inj,Quad PF,6+ Mos 11/20/2012, 10/29/2013, 11/23/2014   Influenza-Unspecified 12/11/2016, 10/21/2019, 10/19/2020   Moderna Covid-19 Fall Seasonal Vaccine 23yrs & older 04/26/2022   Moderna Covid-19 Vaccine Bivalent Booster 77yrs & up 11/17/2020   Moderna Sars-Covid-2 Vaccination 03/15/2019, 04/12/2019, 12/24/2019, 06/10/2020   Pneumococcal Conjugate-13 03/10/2014, 03/09/2015   Pneumococcal Polysaccharide-23 02/20/2008   Pneumococcal-Unspecified 10/21/2002, 04/28/2008   Td 07/16/2007, 04/25/2021   Td (Adult),5 Lf Tetanus Toxid, Preservative Free 04/25/2021   Tdap 02/26/2011   Zoster Recombinant(Shingrix) 10/03/2016, 12/02/2016   Zoster, Live 07/16/2007    Past Medical History:  Diagnosis Date   AAA (abdominal aortic aneurysm) (HCC)    Arthritis    Asthma 1976   Bronchiolitis 06/2014   CAD  (coronary artery disease)    NSTEMI 02/2010;  s/p CABG 1/12 (L-LAD, SOM1, S-PDA); echo in 08/2010: Mild LVH, EF 55%, grade 2 diastolic dysfunction, mild MR, mild LAE, mildly reduced RV function, mild RAE, PASP 42   Carpal tunnel syndrome of left wrist    Cecal angiodysplasia 04/04/2022   Colon polyps    COPD (chronic obstructive pulmonary disease) (HCC)    Diabetes mellitus 2000   Type II, controlls with diet   Dyspnea    ED (erectile dysfunction)    Gout    History of radiation therapy 03/09/2020-03/16/2020   SBRT right lung: Dr. Lynwood Nasuti   HTN (hypertension)    Hyperlipidemia    Inguinal hernia    Right   Macular degeneration    Myocardial infarction (HCC) 1 -13-2012   Neuropathy     Tobacco History: Social History   Tobacco Use  Smoking Status Former   Current packs/day: 0.00   Average packs/day: 1 pack/day for 40.0 years (40.0 ttl pk-yrs)   Types: Cigarettes   Start date: 02/20/1963   Quit date: 02/20/2003   Years since quitting: 20.6   Passive exposure: Past  Smokeless Tobacco Never   Counseling given: Not Answered   Outpatient Medications Prior to Visit  Medication Sig Dispense Refill   albuterol  (VENTOLIN  HFA) 108 (90 Base) MCG/ACT inhaler  Inhale 2 puffs into the lungs every 6 (six) hours as needed for wheezing or shortness of breath. 18 g 0   ANORO ELLIPTA  62.5-25 MCG/ACT AEPB USE 1 INHALATION DAILY 180 each 3   Cholecalciferol  (VITAMIN D3) 50 MCG (2000 UT) capsule Take 2,000 Units by mouth daily.     Cyanocobalamin  1000 MCG CAPS Take 1,000 mcg by mouth daily.     ferrous sulfate  325 (65 FE) MG tablet Take 1 tablet (325 mg total) by mouth daily with breakfast. 90 tablet 1   furosemide  (LASIX ) 40 MG tablet Take 1 tablet (40 mg total) by mouth daily. 15 tablet 0   memantine  (NAMENDA ) 5 MG tablet Take 1 tablet (5 mg total) by mouth 2 (two) times daily. 60 tablet 0   mirtazapine  (REMERON ) 15 MG tablet Take 0.5 tablets (7.5 mg total) by mouth at bedtime.      predniSONE  (DELTASONE ) 10 MG tablet Take 1 tablet (10 mg total) by mouth 2 (two) times daily with a meal. 30 tablet 0   predniSONE  (DELTASONE ) 20 MG tablet Take 1 tablet (20 mg total) by mouth daily with breakfast. 30 tablet 1   simvastatin  (ZOCOR ) 40 MG tablet Take 1 tablet (40 mg total) by mouth daily. 90 tablet 3   sulfamethoxazole -trimethoprim  (BACTRIM  DS) 800-160 MG tablet TAKE 1 TABLET BY MOUTH AS A SINGLE DOSE. TAKE 1 TABLET ON MONDAY, WEDNESDAY, AND FRIDAY 12 tablet 5   No facility-administered medications prior to visit.   Review of Systems  Review of Systems  Respiratory:  Negative for cough, shortness of breath and wheezing.    Physical Exam  There were no vitals taken for this visit. Physical Exam Constitutional:      Appearance: Normal appearance.  Cardiovascular:     Rate and Rhythm: Normal rate and regular rhythm.     Comments: Trace edema Pulmonary:     Effort: Pulmonary effort is normal.     Breath sounds: Normal breath sounds. No wheezing, rhonchi or rales.     Comments: 4L  Neurological:     General: No focal deficit present.     Mental Status: He is alert and oriented to person, place, and time. Mental status is at baseline.  Psychiatric:        Mood and Affect: Mood normal.        Behavior: Behavior normal.        Thought Content: Thought content normal.        Judgment: Judgment normal.       Lab Results:  CBC    Component Value Date/Time   WBC 11.7 (H) 09/18/2023 1149   RBC 3.33 (L) 09/18/2023 1149   HGB 10.3 (L) 09/18/2023 1149   HCT 31.8 (L) 09/18/2023 1149   PLT 185.0 09/18/2023 1149   MCV 95.5 09/18/2023 1149   MCH 31.3 09/02/2023 0428   MCHC 32.2 09/18/2023 1149   RDW 15.2 09/18/2023 1149   LYMPHSABS 0.4 (L) 09/18/2023 1149   MONOABS 0.6 09/18/2023 1149   EOSABS 0.0 09/18/2023 1149   BASOSABS 0.0 09/18/2023 1149    BMET    Component Value Date/Time   NA 143 09/18/2023 1149   NA 143 10/10/2021 1054   K 5.1 09/18/2023 1149   CL  101 09/18/2023 1149   CO2 32 09/18/2023 1149   GLUCOSE 251 (H) 09/18/2023 1149   BUN 46 (H) 09/18/2023 1149   BUN 28 (H) 10/10/2021 1054   CREATININE 3.00 (H) 09/18/2023 1149   CREATININE 1.58 (H) 10/15/2019  1117   CALCIUM 9.4 09/18/2023 1149   CALCIUM 9.0 03/16/2016 0000   GFRNONAA 26 (L) 08/29/2023 0925   GFRNONAA 39 (L) 10/15/2019 1117   GFRAA 46 (L) 10/15/2019 1117    BNP    Component Value Date/Time   BNP 576.0 (H) 08/27/2023 2206    ProBNP No results found for: PROBNP  Imaging: No results found.   Assessment & Plan:   No problem-specific Assessment & Plan notes found for this encounter.   1. Pulmonary emphysema, unspecified emphysema type (HCC) (Primary)  2. Pulmonary nodules  3. Asthma, unspecified asthma severity, unspecified whether complicated, unspecified whether persistent  4. Chronic respiratory failure with hypoxia (HCC)  5. Pleural effusion due to CHF (congestive heart failure) (HCC)   Assessment and Plan Assessment & Plan Chronic obstructive pulmonary disease (COPD) with chronic respiratory failure  COPD with chronic respiratory failure is well-managed. Oxygen  requirements have decreased from six liters to four liters. He is currently on Anoro inhaler once daily and prednisone  20 mg daily. No recent use of albuterol  rescue inhaler. Breathing has improved since hospitalization for GI bleed. Considering reducing prednisone  dosage to minimize impact on stage four kidney disease. - Continue Anoro inhaler, one puff daily in the morning - Decrease prednisone  to 15 mg daily for four weeks, then 10 mg daily for four weeks, and then 5 mg daily until follow-up - Continue supplemental oxygen  at four liters to maintain O2 >88%   History of non-small cell lung cancer, status post radiation Non-small cell lung cancer treated with radiation. Currently, there is no indication for further radiation as nodules are stable and presumed to be scar tissue. - Continue  routine monitoring with Dr. Shannon, due for follow-up CT chest in September     Almarie LELON Ferrari, NP 10/10/2023

## 2023-10-15 ENCOUNTER — Telehealth: Payer: Self-pay

## 2023-10-15 NOTE — Patient Instructions (Signed)
 Lamar DELENA Silvan - I am sorry I was unable to reach you today for our scheduled appointment. I work with Geofm, Glade PARAS, MD and am calling to support your healthcare needs. Please contact me at [phone] at your earliest convenience. I look forward to speaking with you soon.   Thank you,   Heddy Shutter, RN, MSN, BSN, CCM [place]  [facility], Population Health Case Manager Phone: [phone]

## 2023-10-17 ENCOUNTER — Telehealth: Payer: Self-pay

## 2023-10-18 ENCOUNTER — Encounter: Payer: Self-pay | Admitting: Internal Medicine

## 2023-10-18 ENCOUNTER — Other Ambulatory Visit: Payer: Self-pay

## 2023-10-18 MED ORDER — ALBUTEROL SULFATE HFA 108 (90 BASE) MCG/ACT IN AERS: 2.0000 | INHALATION_SPRAY | Freq: Four times a day (QID) | RESPIRATORY_TRACT | 4 refills | Status: AC | PRN

## 2023-10-18 MED ORDER — SULFAMETHOXAZOLE-TRIMETHOPRIM 800-160 MG PO TABS: 1.0000 | ORAL_TABLET | ORAL | 5 refills | Status: DC

## 2023-10-18 NOTE — Telephone Encounter (Signed)
 Can we find out what the bactrim  is for and who originally ordered it? I know that I did refill it at his request x 1

## 2023-10-18 NOTE — Telephone Encounter (Signed)
 Rx sent & pt is aware. Nothing further needed.

## 2023-10-18 NOTE — Telephone Encounter (Signed)
 Called and spoke with pts daughter (DPR). Daughter states pt is taking Bactrim  along with Prednisone  due to his age and immunity. Lov Beth was wanting to change prednisone  to taper. Daughter states pt takes Bactrim  3x weekly, was told to take this while on Prednisone .  Daughter also stated Dr. Shelah is the one who started him on rx.   Dr. Shelah looks like you started him on Bactrim  per ov note 07/05/2022.

## 2023-10-18 NOTE — Telephone Encounter (Signed)
 Thank you - please refill the bactrim . We can talk about weaning the pred going forward.

## 2023-10-22 ENCOUNTER — Telehealth: Payer: Self-pay | Admitting: *Deleted

## 2023-10-22 NOTE — Progress Notes (Unsigned)
 Complex Care Management Care Guide Note  10/22/2023 Name: [name] MRN: [id number] DOB: Sep 05, 1931  Brian [name] is a 89 y.o. year old male who is a primary care patient of Geofm, Glade PARAS, MD and is actively engaged with the care management team. I reached out to Brian [name] by phone today to assist with re-scheduling  with the RN Case Manager.  Follow up plan: Unsuccessful telephone outreach attempt made. A HIPAA compliant phone message was left for the patient providing contact information and requesting a return call.  Thedford Franks, CMA [place]  [facility], [facility] Guide Direct Dial: [phone]  Fax: [phone] Website: [email or web address]

## 2023-10-23 ENCOUNTER — Ambulatory Visit: Admitting: Emergency Medicine

## 2023-10-23 NOTE — Progress Notes (Signed)
 Complex Care Management Care Guide Note  10/23/2023 Name: Brian [name] MRN: [id number] DOB: 19-Dec-1931  Brian [name] is a 89 y.o. year old male who is a primary care patient of Geofm, Glade PARAS, MD and is actively engaged with the care management team. I reached out to Brian [name] by phone today to assist with re-scheduling  with the RN Case Manager.  Follow up plan: Unsuccessful telephone outreach attempt made. A HIPAA compliant phone message was left for the patient providing contact information and requesting a return call. Not further outreach attempts will be made due to inability to maintain patient contact.   Thedford Franks, CMA [place]  [facility], [facility] Guide Direct Dial: [phone]  Fax: [phone] Website: [email or web address]

## 2023-10-25 ENCOUNTER — Emergency Department (HOSPITAL_COMMUNITY)

## 2023-10-25 ENCOUNTER — Other Ambulatory Visit: Payer: Self-pay

## 2023-10-25 ENCOUNTER — Inpatient Hospital Stay (HOSPITAL_COMMUNITY)
Admission: EM | Admit: 2023-10-25 | Discharge: 2023-10-30 | DRG: 378 | Disposition: A | Discharge status: Home / Self Care | Attending: Internal Medicine | Admitting: Internal Medicine

## 2023-10-25 DIAGNOSIS — Z809 Family history of malignant neoplasm, unspecified: Secondary | ICD-10-CM

## 2023-10-25 DIAGNOSIS — E1165 Type 2 diabetes mellitus with hyperglycemia: Secondary | ICD-10-CM | POA: Diagnosis present

## 2023-10-25 DIAGNOSIS — Z8249 Family history of ischemic heart disease and other diseases of the circulatory system: Secondary | ICD-10-CM | POA: Diagnosis not present

## 2023-10-25 DIAGNOSIS — Z951 Presence of aortocoronary bypass graft: Secondary | ICD-10-CM

## 2023-10-25 DIAGNOSIS — Z833 Family history of diabetes mellitus: Secondary | ICD-10-CM

## 2023-10-25 DIAGNOSIS — K922 Gastrointestinal hemorrhage, unspecified: Principal | ICD-10-CM | POA: Diagnosis present

## 2023-10-25 DIAGNOSIS — F0283 Dementia in other diseases classified elsewhere, unspecified severity, with mood disturbance: Secondary | ICD-10-CM | POA: Diagnosis present

## 2023-10-25 DIAGNOSIS — Z79899 Other long term (current) drug therapy: Secondary | ICD-10-CM

## 2023-10-25 DIAGNOSIS — K5521 Angiodysplasia of colon with hemorrhage: Principal | ICD-10-CM | POA: Diagnosis present

## 2023-10-25 DIAGNOSIS — J9611 Chronic respiratory failure with hypoxia: Secondary | ICD-10-CM | POA: Diagnosis not present

## 2023-10-25 DIAGNOSIS — I130 Hypertensive heart and chronic kidney disease with heart failure and stage 1 through stage 4 chronic kidney disease, or unspecified chronic kidney disease: Secondary | ICD-10-CM | POA: Diagnosis present

## 2023-10-25 DIAGNOSIS — H3530 Unspecified macular degeneration: Secondary | ICD-10-CM | POA: Diagnosis present

## 2023-10-25 DIAGNOSIS — Z860101 Personal history of adenomatous and serrated colon polyps: Secondary | ICD-10-CM

## 2023-10-25 DIAGNOSIS — Z7951 Long term (current) use of inhaled steroids: Secondary | ICD-10-CM

## 2023-10-25 DIAGNOSIS — N184 Chronic kidney disease, stage 4 (severe): Secondary | ICD-10-CM | POA: Diagnosis present

## 2023-10-25 DIAGNOSIS — R710 Precipitous drop in hematocrit: Secondary | ICD-10-CM

## 2023-10-25 DIAGNOSIS — I2510 Atherosclerotic heart disease of native coronary artery without angina pectoris: Secondary | ICD-10-CM | POA: Diagnosis present

## 2023-10-25 DIAGNOSIS — Z9981 Dependence on supplemental oxygen: Secondary | ICD-10-CM

## 2023-10-25 DIAGNOSIS — I452 Bifascicular block: Secondary | ICD-10-CM | POA: Diagnosis present

## 2023-10-25 DIAGNOSIS — Z87891 Personal history of nicotine dependence: Secondary | ICD-10-CM

## 2023-10-25 DIAGNOSIS — J90 Pleural effusion, not elsewhere classified: Secondary | ICD-10-CM | POA: Diagnosis not present

## 2023-10-25 DIAGNOSIS — I700 Atherosclerosis of aorta: Secondary | ICD-10-CM | POA: Diagnosis not present

## 2023-10-25 DIAGNOSIS — J449 Chronic obstructive pulmonary disease, unspecified: Secondary | ICD-10-CM | POA: Diagnosis present

## 2023-10-25 DIAGNOSIS — E875 Hyperkalemia: Secondary | ICD-10-CM | POA: Diagnosis not present

## 2023-10-25 DIAGNOSIS — J4489 Other specified chronic obstructive pulmonary disease: Secondary | ICD-10-CM | POA: Diagnosis present

## 2023-10-25 DIAGNOSIS — E1140 Type 2 diabetes mellitus with diabetic neuropathy, unspecified: Secondary | ICD-10-CM | POA: Diagnosis present

## 2023-10-25 DIAGNOSIS — F0153 Vascular dementia, unspecified severity, with mood disturbance: Secondary | ICD-10-CM | POA: Diagnosis present

## 2023-10-25 DIAGNOSIS — D62 Acute posthemorrhagic anemia: Secondary | ICD-10-CM | POA: Diagnosis present

## 2023-10-25 DIAGNOSIS — Z85118 Personal history of other malignant neoplasm of bronchus and lung: Secondary | ICD-10-CM

## 2023-10-25 DIAGNOSIS — E1122 Type 2 diabetes mellitus with diabetic chronic kidney disease: Secondary | ICD-10-CM | POA: Diagnosis present

## 2023-10-25 DIAGNOSIS — I252 Old myocardial infarction: Secondary | ICD-10-CM

## 2023-10-25 DIAGNOSIS — I5032 Chronic diastolic (congestive) heart failure: Secondary | ICD-10-CM | POA: Diagnosis not present

## 2023-10-25 DIAGNOSIS — Z923 Personal history of irradiation: Secondary | ICD-10-CM

## 2023-10-25 DIAGNOSIS — G309 Alzheimer's disease, unspecified: Secondary | ICD-10-CM | POA: Diagnosis not present

## 2023-10-25 DIAGNOSIS — Z832 Family history of diseases of the blood and blood-forming organs and certain disorders involving the immune mechanism: Secondary | ICD-10-CM

## 2023-10-25 DIAGNOSIS — Z8261 Family history of arthritis: Secondary | ICD-10-CM

## 2023-10-25 DIAGNOSIS — D509 Iron deficiency anemia, unspecified: Secondary | ICD-10-CM | POA: Diagnosis not present

## 2023-10-25 DIAGNOSIS — E785 Hyperlipidemia, unspecified: Secondary | ICD-10-CM | POA: Diagnosis present

## 2023-10-25 DIAGNOSIS — R0602 Shortness of breath: Secondary | ICD-10-CM | POA: Diagnosis not present

## 2023-10-25 DIAGNOSIS — D500 Iron deficiency anemia secondary to blood loss (chronic): Secondary | ICD-10-CM | POA: Diagnosis not present

## 2023-10-25 DIAGNOSIS — Z888 Allergy status to other drugs, medicaments and biological substances status: Secondary | ICD-10-CM

## 2023-10-25 DIAGNOSIS — E11649 Type 2 diabetes mellitus with hypoglycemia without coma: Secondary | ICD-10-CM | POA: Diagnosis not present

## 2023-10-25 DIAGNOSIS — Z91040 Latex allergy status: Secondary | ICD-10-CM

## 2023-10-25 DIAGNOSIS — D8989 Other specified disorders involving the immune mechanism, not elsewhere classified: Secondary | ICD-10-CM | POA: Diagnosis present

## 2023-10-25 DIAGNOSIS — Z83438 Family history of other disorder of lipoprotein metabolism and other lipidemia: Secondary | ICD-10-CM

## 2023-10-25 DIAGNOSIS — Z806 Family history of leukemia: Secondary | ICD-10-CM

## 2023-10-25 DIAGNOSIS — J439 Emphysema, unspecified: Secondary | ICD-10-CM | POA: Diagnosis not present

## 2023-10-25 DIAGNOSIS — F03A0 Unspecified dementia, mild, without behavioral disturbance, psychotic disturbance, mood disturbance, and anxiety: Secondary | ICD-10-CM | POA: Diagnosis not present

## 2023-10-25 DIAGNOSIS — Z7984 Long term (current) use of oral hypoglycemic drugs: Secondary | ICD-10-CM

## 2023-10-25 LAB — COMPREHENSIVE METABOLIC PANEL WITH GFR
ALT: 15 U/L (ref 0–44)
AST: 14 U/L — ABNORMAL LOW (ref 15–41)
Albumin: 3.9 g/dL (ref 3.5–5.0)
Alkaline Phosphatase: 65 U/L (ref 38–126)
Anion gap: 13 (ref 5–15)
BUN: 54 mg/dL — ABNORMAL HIGH (ref 8–23)
CO2: 24 mmol/L (ref 22–32)
Calcium: 9.1 mg/dL (ref 8.9–10.3)
Chloride: 103 mmol/L (ref 98–111)
Creatinine, Ser: 2.72 mg/dL — ABNORMAL HIGH (ref 0.61–1.24)
GFR, Estimated: 22 mL/min — ABNORMAL LOW (ref >60)
Glucose, Bld: 242 mg/dL — ABNORMAL HIGH (ref 70–99)
Potassium: 5.4 mmol/L — ABNORMAL HIGH (ref 3.5–5.1)
Sodium: 140 mmol/L (ref 135–145)
Total Bilirubin: 0.2 mg/dL (ref 0.0–1.2)
Total Protein: 6.1 g/dL — ABNORMAL LOW (ref 6.5–8.1)

## 2023-10-25 LAB — CBC
HCT: 29.3 % — ABNORMAL LOW (ref 39.0–52.0)
Hemoglobin: 8.6 g/dL — ABNORMAL LOW (ref 13.0–17.0)
MCH: 29.9 pg (ref 26.0–34.0)
MCHC: 29.4 g/dL — ABNORMAL LOW (ref 30.0–36.0)
MCV: 101.7 fL — ABNORMAL HIGH (ref 80.0–100.0)
Platelets: 182 K/uL (ref 150–400)
RBC: 2.88 MIL/uL — ABNORMAL LOW (ref 4.22–5.81)
RDW: 14 % (ref 11.5–15.5)
WBC: 11.8 K/uL — ABNORMAL HIGH (ref 4.0–10.5)
nRBC: 0.2 % (ref 0.0–0.2)

## 2023-10-25 LAB — TROPONIN T, HIGH SENSITIVITY
Troponin T High Sensitivity: 58 ng/L — ABNORMAL HIGH (ref 0–19)
Troponin T High Sensitivity: 53 ng/L — ABNORMAL HIGH (ref 0–19)

## 2023-10-25 LAB — MAGNESIUM: Magnesium: 2.3 mg/dL (ref 1.7–2.4)

## 2023-10-25 LAB — GLUCOSE, CAPILLARY: Glucose-Capillary: 286 mg/dL — ABNORMAL HIGH (ref 70–99)

## 2023-10-25 LAB — PRO BRAIN NATRIURETIC PEPTIDE: Pro Brain Natriuretic Peptide: 1612 pg/mL — ABNORMAL HIGH (ref <300.0)

## 2023-10-25 MED ORDER — INSULIN ASPART 100 UNIT/ML IJ SOLN
0.0000 [IU] | INTRAMUSCULAR | Status: DC
  Administered 2023-10-25 – 2023-10-26 (×3): 8 [IU] via SUBCUTANEOUS
  Administered 2023-10-26: 11 [IU] via SUBCUTANEOUS

## 2023-10-25 MED ORDER — PANTOPRAZOLE SODIUM 40 MG IV SOLR
40.0000 mg | Freq: Once | INTRAVENOUS | Status: AC
  Administered 2023-10-25: 40 mg via INTRAVENOUS
  Filled 2023-10-25: qty 10

## 2023-10-25 MED ORDER — BISACODYL 5 MG PO TBEC: 5.0000 mg | DELAYED_RELEASE_TABLET | Freq: Every day | ORAL | Status: DC | PRN

## 2023-10-25 MED ORDER — ONDANSETRON HCL 4 MG/2ML IJ SOLN: 4.0000 mg | Freq: Four times a day (QID) | INTRAMUSCULAR | Status: DC | PRN

## 2023-10-25 MED ORDER — ACETAMINOPHEN 650 MG RE SUPP: 650.0000 mg | Freq: Four times a day (QID) | RECTAL | Status: DC | PRN

## 2023-10-25 MED ORDER — ACETAMINOPHEN 325 MG PO TABS: 650.0000 mg | ORAL_TABLET | Freq: Four times a day (QID) | ORAL | Status: DC | PRN

## 2023-10-25 MED ORDER — SENNOSIDES-DOCUSATE SODIUM 8.6-50 MG PO TABS
1.0000 | ORAL_TABLET | Freq: Every evening | ORAL | Status: DC | PRN
  Administered 2023-10-28: 1 via ORAL
  Filled 2023-10-25: qty 1

## 2023-10-25 MED ORDER — ONDANSETRON HCL 4 MG PO TABS: 4.0000 mg | ORAL_TABLET | Freq: Four times a day (QID) | ORAL | Status: DC | PRN

## 2023-10-25 NOTE — ED Triage Notes (Signed)
 Patient in today reporting SOB with exerction, normally on 4LO2. 88% on 4L now on 6L. Reports blood in stools for an unknown amount of time. Dark and tarry. Lives with daughter.

## 2023-10-25 NOTE — ED Provider Notes (Signed)
 [place] EMERGENCY DEPARTMENT AT [facility] Provider Note   CSN: [id number] Arrival date & time: 10/25/23  8371     Patient presents with: Shortness of Breath and Rectal Bleeding   Brian [name] is a 89 y.o. male.   89 year old male presents today for concern of melanotic stools that started today.  History of AVM bleed and recent admission.  This was repaired in recent admission but reoccurred today.  Shortness of breath over the past couple days with increase of baseline supplemental O2 from 4 L to 6 L.  No hematemesis.  Denies other complaints.  The history is provided by the patient. No language interpreter was used.       Prior to Admission medications   Medication Sig Start Date End Date Taking? Authorizing Provider  albuterol  (VENTOLIN  HFA) 108 (90 Base) MCG/ACT inhaler Inhale 2 puffs into the lungs every 6 (six) hours as needed for wheezing or shortness of breath. 10/18/23   Geofm Glade PARAS, MD  ANORO ELLIPTA  62.5-25 MCG/ACT AEPB USE 1 INHALATION DAILY 09/30/23   Geofm Glade PARAS, MD  Cholecalciferol  (VITAMIN D3) 50 MCG (2000 UT) capsule Take 2,000 Units by mouth daily.    [provider]  Cyanocobalamin  1000 MCG CAPS Take 1,000 mcg by mouth daily.    [provider]  ferrous sulfate  325 (65 FE) MG tablet Take 1 tablet (325 mg total) by mouth daily with breakfast. 09/28/22   Geofm Glade PARAS, MD  furosemide  (LASIX ) 40 MG tablet Take 1 tablet (40 mg total) by mouth daily. 09/03/23   Rosario Leatrice FERNS, MD  memantine  (NAMENDA ) 5 MG tablet Take 1 tablet (5 mg total) by mouth 2 (two) times daily. 09/02/23   Rosario Leatrice FERNS, MD  mirtazapine  (REMERON ) 15 MG tablet Take 0.5 tablets (7.5 mg total) by mouth at bedtime. 10/07/23   Geofm Glade PARAS, MD  predniSONE  (DELTASONE ) 10 MG tablet Take 1.5 tablets (15mg  total) daily with breakfast 10/10/23   Hope Almarie ORN, NP  simvastatin  (ZOCOR ) 40 MG tablet Take 1 tablet (40 mg total) by mouth daily. 02/27/23   Geofm Glade PARAS, MD  sulfamethoxazole -trimethoprim  (BACTRIM  DS) 800-160 MG tablet Take 1 tablet by mouth 3 (three) times a week. 10/18/23   Shelah Lamar RAMAN, MD    Allergies: Meperidine hcl, Sertraline , Gabapentin , Memantine , Latex, and Zolpidem tartrate    Review of Systems  Respiratory:  Positive for shortness of breath.   Cardiovascular:  Negative for chest pain.  Gastrointestinal:  Positive for blood in stool. Negative for abdominal pain, nausea and vomiting.  Neurological:  Negative for light-headedness.  All other systems reviewed and are negative.   Updated Vital Signs BP (!) 142/58 (BP Location: Left Arm)   Pulse 71   Temp 98.2 F (36.8 C) (Oral)   Resp (!) 22   SpO2 95% Comment: on 6L  Physical Exam Vitals and nursing note reviewed.  Constitutional:      General: He is not in acute distress.    Appearance: Normal appearance. He is not ill-appearing.  HENT:     Head: Normocephalic and atraumatic.     Nose: Nose normal.  Eyes:     Conjunctiva/sclera: Conjunctivae normal.  Cardiovascular:     Rate and Rhythm: Normal rate and regular rhythm.  Pulmonary:     Effort: Pulmonary effort is normal. No respiratory distress.  Abdominal:     General: There is no distension.     Palpations: Abdomen is soft.  Tenderness: There is no abdominal tenderness. There is no guarding.  Musculoskeletal:        General: No deformity. Normal range of motion.     Cervical back: Normal range of motion.     Right lower leg: No edema.     Left lower leg: No edema.  Skin:    Findings: No rash.  Neurological:     Mental Status: He is alert.     (all labs ordered are listed, but only abnormal results are displayed) Labs Reviewed  COMPREHENSIVE METABOLIC PANEL WITH GFR  CBC  POC OCCULT BLOOD, ED  TYPE AND SCREEN    EKG: None  Radiology: No results found.   .Critical Care  Performed by: Hildegard Loge, PA-C Authorized by: Hildegard Loge, PA-C   Critical care provider statement:     Critical care time (minutes):  30   Critical care was necessary to treat or prevent imminent or life-threatening deterioration of the following conditions: GI bleed, GI consult.   Critical care was time spent personally by me on the following activities:  Development of treatment plan with patient or surrogate, discussions with consultants, evaluation of patient's response to treatment, examination of patient, ordering and review of laboratory studies, ordering and review of radiographic studies, ordering and performing treatments and interventions, pulse oximetry, re-evaluation of patient's condition and review of old charts   Care discussed with: admitting provider      Medications Ordered in the ED - No data to display  Clinical Course as of 10/25/23 1852  Fri Oct 25, 2023  8151 Spoke with Dr. Avram of gastroenterology.  Recommends admission and n.p.o. after midnight. [AA]    Clinical Course User Index [AA] Hildegard Loge, PA-C                                 Medical Decision Making Amount and/or Complexity of Data Reviewed Labs: ordered. Radiology: ordered.  Risk Prescription drug management. Decision regarding hospitalization.   Medical Decision Making / ED Course   This patient presents to the ED for concern of GI bleed, this involves an extensive number of treatment options, and is a complaint that carries with it a high risk of complications and morbidity.  The differential diagnosis includes upper GI bleed, lower GI bleed,  MDM: 89 year old male presents today for concern of shortness of breath with exertion, increasing his supplemental O2, and blood in the stool that he noticed today.  Recent history of GI bleed with AVM repair.  He states that he had a AVM in his cecum which was repaired. No bleeding episodes until today.  Follows with Dr. Legrand gastroenterology group. Denies any chest pain. Will obtain ACS workup to ensure there is no NSTEMI from demand.  Given  exertional dyspnea we will check a BNP.  Creatinine at baseline.  Hemoglobin 8.6 which is downtrending since last month. Discussed with hospitalist for admission.  Troponin mildly elevated likely due to demand.  No active chest pain. proBNP of 1600 which is less than 1800 for his age.  Discussed with hospitalist will evaluate patient for admission.  Rectal exam deferred as it would not change management.   Additional history obtained: -Additional history obtained from daughter who is at bedside.  She is also his caretaker. -External records from outside source obtained and reviewed including: Chart review including previous notes, labs, imaging, consultation notes   Lab Tests: -I ordered, reviewed, and interpreted labs.  The pertinent results include:   Labs Reviewed  COMPREHENSIVE METABOLIC PANEL WITH GFR  CBC  POC OCCULT BLOOD, ED  TYPE AND SCREEN      EKG  EKG Interpretation Date/Time:    Ventricular Rate:    PR Interval:    QRS Duration:    QT Interval:    QTC Calculation:   R Axis:      Text Interpretation:           Medicines ordered and prescription drug management: No orders of the defined types were placed in this encounter.   -I have reviewed the patients home medicines and have made adjustments as needed  Critical interventions GI bleed, Protonix , GI consult  Consultations Obtained: I requested consultation with the Dr. Avram with GI,  and discussed lab and imaging findings as well as pertinent plan - they recommend: As above   Reevaluation: After the interventions noted above, I reevaluated the patient and found that they have :stayed the same  Co morbidities that complicate the patient evaluation  Past Medical History:  Diagnosis Date   AAA (abdominal aortic aneurysm) (HCC)    Arthritis    Asthma 1976   Bronchiolitis 06/2014   CAD (coronary artery disease)    NSTEMI 02/2010;  s/p CABG 1/12 (L-LAD, SOM1, S-PDA); echo in 08/2010: Mild  LVH, EF 55%, grade 2 diastolic dysfunction, mild MR, mild LAE, mildly reduced RV function, mild RAE, PASP 42   Carpal tunnel syndrome of left wrist    Cecal angiodysplasia 04/04/2022   Colon polyps    COPD (chronic obstructive pulmonary disease) (HCC)    Diabetes mellitus 2000   Type II, controlls with diet   Dyspnea    ED (erectile dysfunction)    Gout    History of radiation therapy 03/09/2020-03/16/2020   SBRT right lung: Dr. Lynwood Nasuti   HTN (hypertension)    Hyperlipidemia    Inguinal hernia    Right   Macular degeneration    Myocardial infarction [facility]) 1 -13-2012   Neuropathy       Dispostion: Discussed with hospitalist.  They will evaluate patient for admission.  Final diagnoses:  Gastrointestinal hemorrhage, unspecified gastrointestinal hemorrhage type    ED Discharge Orders     None          Hildegard Loge, PA-C 10/25/23 1927    Dasie Faden, MD 10/25/23 2247

## 2023-10-25 NOTE — ED Provider Notes (Signed)
 I provided a substantive portion of the care of this patient.  I personally made/approved the management plan for this patient and take responsibility for the patient management.  EKG Interpretation Date/Time:  Friday October 25 2023 16:37:45 EDT Ventricular Rate:  72 PR Interval:  185 QRS Duration:  137 QT Interval:  404 QTC Calculation: 443 R Axis:   265  Text Interpretation: Sinus rhythm RBBB and LAFB Minimal ST elevation, lateral leads Baseline wander in lead(s) V3 No significant change since last tracing Confirmed by Dasie Faden ([id number]) on 10/25/2023 5:33:37 PM patient here complaining of weakness and shortness of breath.  History of COPD.  Also developed GI bleeding.  I did look at the picture from his daughter of grossly bloody stools.  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  Patient will require admission   Dasie Faden, MD 10/25/23 [phone]

## 2023-10-25 NOTE — ED Notes (Signed)
 Pt has been given saltines and a cup of water

## 2023-10-25 NOTE — H&P (Signed)
 History and Physical  Brian [name] FMW:[id number] DOB: 1931-03-23 DOA: 10/25/2023  PCP: Geofm Glade PARAS, MD   Chief Complaint: Bloody stools  HPI: Brian [name] is a 89 y.o. male with medical history significant for CAD s/p CABG, COPD, CHRF on 4-6 L O2, CKD4, diabetes type 2, hypertension, hyperlipidemia, alzheimers/vascular dementia, aortic aneurysm, and hx of GI bleeding with prior Cecal AVM who presents to the ED for evaluation of bloody stools. Patient reports he has been having some dark stools over the last few days and after having a dark tarry stool today, he called his daughter to look. Per daughter, patient has had recent dyspnea on exertion and fatigue and these are the same symptoms symptoms he had with his previous GI bleeds. Patient continues to endorse fatigue and mild shortness of breath but denies any chest pain, dizziness, headache, nausea, vomiting, abdominal pain, palpitations, dysuria or hematuria.  ED Course: Initial vitals show temp 98.2, RR 22, HR 71, BP 142/58, SpO2 95% on 4 L. Initial labs significant for WBC 11.8, Hgb 8.6, platelet 182. K+ 5.4, glucose 242, BUN/creatinine 54/2.72, troponin 58, proBNP 1612. EKG shows sinus rhythm with RBBB, LAFB and nonspecific ST changes. CXR shows small right pleural effusion. Pt received IV Protonix  40 mg x 1.  GI was consulted for evaluation. TRH was consulted for admission.   Review of Systems: Please see HPI for pertinent positives and negatives. A complete 10 system review of systems are otherwise negative.  Past Medical History:  Diagnosis Date   AAA (abdominal aortic aneurysm) (HCC)    Arthritis    Asthma 1976   Bronchiolitis 06/2014   CAD (coronary artery disease)    NSTEMI 02/2010;  s/p CABG 1/12 (L-LAD, SOM1, S-PDA); echo in 08/2010: Mild LVH, EF 55%, grade 2 diastolic dysfunction, mild MR, mild LAE, mildly reduced RV function, mild RAE, PASP 42   Carpal tunnel syndrome of left wrist    Cecal angiodysplasia 04/04/2022    Colon polyps    COPD (chronic obstructive pulmonary disease) (HCC)    Diabetes mellitus 2000   Type II, controlls with diet   Dyspnea    ED (erectile dysfunction)    Gout    History of radiation therapy 03/09/2020-03/16/2020   SBRT right lung: Dr. Lynwood Nasuti   HTN (hypertension)    Hyperlipidemia    Inguinal hernia    Right   Macular degeneration    Myocardial infarction [facility]) 1 -13-2012   Neuropathy    Past Surgical History:  Procedure Laterality Date   3 vessel CABG  03/03/2010   ABDOMINAL AORTIC ENDOVASCULAR STENT GRAFT N/A 01/21/2019   Procedure: ABDOMINAL AORTIC ENDOVASCULAR STENT GRAFT;  Surgeon: Sheree Penne Bruckner, MD;  Location: [facility] OR;  Service: Vascular;  Laterality: N/A;   cataract left eye     COLONOSCOPY     neg 2009   COLONOSCOPY  2014   5 mm sessile polyp; AVM   COLONOSCOPY N/A 08/31/2023   Procedure: COLONOSCOPY;  Surgeon: Legrand Victory LITTIE DOUGLAS, MD;  Location: [facility] ENDOSCOPY;  Service: Gastroenterology;  Laterality: N/A;   COLONOSCOPY WITH PROPOFOL  N/A 04/04/2022   Procedure: COLONOSCOPY WITH PROPOFOL ;  Surgeon: Avram Lupita BRAVO, MD;  Location: [facility] ENDOSCOPY;  Service: Gastroenterology;  Laterality: N/A;   CORONARY ARTERY BYPASS GRAFT  2012   X3   ENDOVASCULAR REPAIR/STENT GRAFT  01/21/2019   ABDOMINAL AORTIC ENDOVASCULAR STENT GRAFT (N/A )   ESOPHAGOGASTRODUODENOSCOPY (EGD) WITH PROPOFOL  N/A 04/04/2022   Procedure: ESOPHAGOGASTRODUODENOSCOPY (EGD) WITH PROPOFOL ;  Surgeon:  Avram Lupita BRAVO, MD;  Location: [facility]COPY;  Service: Gastroenterology;  Laterality: N/A;   EYE SURGERY     Left cataract   HERNIA REPAIR     HOT HEMOSTASIS N/A 04/04/2022   Procedure: HOT HEMOSTASIS (ARGON PLASMA COAGULATION/BICAP);  Surgeon: Avram Lupita BRAVO, MD;  Location: [facility];  Service: Gastroenterology;  Laterality: N/A;   INCISION AND DRAINAGE PERIRECTAL ABSCESS     INGUINAL HERNIA REPAIR     Social History:  reports that he quit smoking about 20 years ago. His smoking use  included cigarettes. He started smoking about 60 years ago. He has a 40 pack-year smoking history. He has been exposed to tobacco smoke. He has never used smokeless tobacco. He reports that he does not currently use alcohol  after a past usage of about 1.0 standard drink of alcohol  per week. He reports that he does not use drugs.  Allergies  Allergen Reactions   Meperidine Hcl Swelling and Other (See Comments)    Tongue swelling Because of a history of documented adverse serious drug reaction, Medi Alert bracelet  is recommended.   Sertraline  Shortness Of Breath and Other (See Comments)    Burning sensation from Head to toe, diff breathing   Gabapentin  Other (See Comments)    Confusion    Memantine  Other (See Comments)    Can take only up to 5 mg a day- causes lethargy past that amount   Latex Rash and Other (See Comments)    Minor rash   Zolpidem Tartrate Other (See Comments)    Nightmares    Family History  Problem Relation Age of Onset   Heart failure Mother    Heart disease Mother        CHF   Heart attack Mother    Hypertension Mother    Heart attack Father    Heart disease Father 68       MI   Hypertension Father    Heart attack Sister 28   Heart disease Sister 55       MI   Diabetes Sister    Hyperlipidemia Sister    Hypertension Sister    Varicose Veins Sister    Heart attack Brother 82   Diabetes Brother    Heart disease Brother 30       MI   Cancer Brother    Hypertension Brother    Deep vein thrombosis Daughter    Leukemia Daughter    Other Son        varicose veins   Arthritis/Rheumatoid Son    Lupus Daughter    Colon cancer Neg Hx      Prior to Admission medications   Medication Sig Start Date End Date Taking? Authorizing Provider  albuterol  (VENTOLIN  HFA) 108 (90 Base) MCG/ACT inhaler Inhale 2 puffs into the lungs every 6 (six) hours as needed for wheezing or shortness of breath. 10/18/23   Geofm Glade PARAS, MD  ANORO ELLIPTA  62.5-25 MCG/ACT AEPB USE  1 INHALATION DAILY 09/30/23   Geofm Glade PARAS, MD  Cholecalciferol  (VITAMIN D3) 50 MCG (2000 UT) capsule Take 2,000 Units by mouth daily.    [provider]  Cyanocobalamin  1000 MCG CAPS Take 1,000 mcg by mouth daily.    [provider]  ferrous sulfate  325 (65 FE) MG tablet Take 1 tablet (325 mg total) by mouth daily with breakfast. 09/28/22   Burns, Glade PARAS, MD  furosemide  (LASIX ) 40 MG tablet Take 1 tablet (40 mg total) by mouth daily. 09/03/23  Rosario Leatrice FERNS, MD  memantine  (NAMENDA ) 5 MG tablet Take 1 tablet (5 mg total) by mouth 2 (two) times daily. 09/02/23   Rosario Leatrice FERNS, MD  mirtazapine  (REMERON ) 15 MG tablet Take 0.5 tablets (7.5 mg total) by mouth at bedtime. 10/07/23   Geofm Glade PARAS, MD  predniSONE  (DELTASONE ) 10 MG tablet Take 1.5 tablets (15mg  total) daily with breakfast 10/10/23   Hope Almarie ORN, NP  simvastatin  (ZOCOR ) 40 MG tablet Take 1 tablet (40 mg total) by mouth daily. 02/27/23   Geofm Glade PARAS, MD  sulfamethoxazole -trimethoprim  (BACTRIM  DS) 800-160 MG tablet Take 1 tablet by mouth 3 (three) times a week. 10/18/23   Shelah Lamar RAMAN, MD    Physical Exam: BP (!) 142/58 (BP Location: Left Arm)   Pulse 71   Temp 98.3 F (36.8 C)   Resp (!) 22   SpO2 95% Comment: on 6L General: Pleasant, well-appearing elderly man laying in bed. No acute distress. HEENT: [place]acer/AT. Anicteric sclera CV: RRR. No murmurs, rubs, or gallops. No LE edema Pulmonary: Lungs CTAB. Normal effort. No wheezing or rales. Abdominal: Soft, nontender, nondistended. Normal bowel sounds. Extremities: Palpable radial and DP pulses. Normal ROM. Skin: Warm and dry. No obvious rash or lesions. Neuro: A&Ox3. Moves all extremities. Normal sensation to light touch. No focal deficit. Psych: Normal mood and affect          Labs on Admission:  Basic Metabolic Panel: Recent Labs  Lab 10/25/23 1707  NA 140  K 5.4*  CL 103  CO2 24  GLUCOSE 242*  BUN 54*  CREATININE 2.72*  CALCIUM 9.1   MG 2.3   Liver Function Tests: Recent Labs  Lab 10/25/23 1707  AST 14*  ALT 15  ALKPHOS 65  BILITOT 0.2  PROT 6.1*  ALBUMIN 3.9   No results for input(s): LIPASE, AMYLASE in the last 168 hours. No results for input(s): AMMONIA in the last 168 hours. CBC: Recent Labs  Lab 10/25/23 1707  WBC 11.8*  HGB 8.6*  HCT 29.3*  MCV 101.7*  PLT 182   Cardiac Enzymes: No results for input(s): CKTOTAL, CKMB, CKMBINDEX, TROPONINI in the last 168 hours. BNP (last 3 results) Recent Labs    08/27/23 2206  BNP 576.0*    ProBNP (last 3 results) Recent Labs    10/25/23 1707  PROBNP 1,612.0*    CBG: No results for input(s): GLUCAP in the last 168 hours.  Radiological Exams on Admission: DG Chest 2 View Result Date: 10/25/2023 CLINICAL DATA:  Shortness of breath EXAM: CHEST - 2 VIEW COMPARISON:  08/27/2023 and CT scan from 08/28/2023 FINDINGS: Atherosclerotic calcification of the aortic arch. Band of scarring in the right upper lobe appears stable. Blunted right costophrenic angle compatible with right pleural effusion. Prior CABG. Emphysema. Thoracic spondylosis. Overall similar appearance to the 08/27/2023 exam. IMPRESSION: 1. Small right pleural effusion. 2. Stable band of scarring in the right upper lobe. 3. Emphysema. 4. Thoracic spondylosis. 5. Aortic Atherosclerosis (ICD10-I70.0). Electronically Signed   By: Ryan Salvage M.D.   On: 10/25/2023 17:49   Assessment/Plan Brian [name] is a 89 y.o. male with medical history significant for CAD s/p CABG, COPD, CHRF on 4-6 L O2, CKD4, diabetes type 2, hypertension, hyperlipidemia, alzheimers/vascular dementia, aortic aneurysm, and hx of GI bleeding with prior Cecal AVM who presents to the ED for evaluation of bloody stools and admitted for GI bleed.  # GI bleed # Melena # Hx of AVMs - Hgb of 8.6 on admission from  baseline of 9-5 - Pt presented with a few days of black tarry stools - FOBT still pending, Pt  hemodynamically stable with no abdominal pain - Labs from recent hospitalization shows normal iron  studies vitamin B12 - GI bleed likely from AVMs - GI (Dr. Avram) consulted, will see in a.m. - IV Protonix  40 mg every 12 hours - Continue iron  supplementation - Trend CBC and Transfuse for Hgb goal > 7  # T2DM - Last A1c 7.1% 1 month ago - Blood glucose of 242 on admission - Not currently on any antidiabetic medications - Q4H SS with CBG monitoring while NPO  # CKD stage IV # Mild hyperkalemia - Creatinine of 2.72 around baseline of 2.5-2.7 - K+ slightly elevated to 5.4, no EKG changes - Avoid nephrotoxic agents - Trend renal function follow-up morning K+ - Avoid nephrotoxic agents  # HTN # HFpEF - BP elevated with SBP in the 140s to 160s - proBNP elevated however patient euvolemic on exam - Continue home Lasix  - Strict I&O's, daily weights  # COPD # Chronic hypoxic respiratory failure - Per daughter, on 4 L at baseline but occasionally requires 6 L with exertion - Respiratory status stable, appropriate SpO2 on 4 L [place] - Continue home Anoro Ellipta  - Continue prednisone  taper, currently on 15 mg daily - As needed DuoNebs  # CAD s/p CABG # HLD - Continue simvastatin   # Vascular dementia - Continue memantine   # Mood disorder - Continue Lexapro  and mirtazapine   DVT prophylaxis: SCDs    Code Status: Full Code  Consults called: GI  Family Communication: Discussed admission with daughter at bedside  Severity of Illness: The appropriate patient status for this patient is INPATIENT. Inpatient status is judged to be reasonable and necessary in order to provide the required intensity of service to ensure the patient's safety. The patient's presenting symptoms, physical exam findings, and initial radiographic and laboratory data in the context of their chronic comorbidities is felt to place them at high risk for further clinical deterioration. Furthermore, it is not  anticipated that the patient will be medically stable for discharge from the hospital within 2 midnights of admission.   * I certify that at the point of admission it is my clinical judgment that the patient will require inpatient hospital care spanning beyond 2 midnights from the point of admission due to high intensity of service, high risk for further deterioration and high frequency of surveillance required.*  Level of care: Telemetry   This record has been created using Conservation officer, historic buildings. Errors have been sought and corrected, but may not always be located. Such creation errors do not reflect on the standard of care.   Lou Claretta HERO, MD 10/25/2023, 7:58 PM [facility] Pager: [phone] Isaiah 41:10   If 7PM-7AM, please contact night-coverage [email or web address] Password [email or web address]

## 2023-10-26 ENCOUNTER — Encounter (HOSPITAL_COMMUNITY): Payer: Self-pay | Admitting: Student

## 2023-10-26 DIAGNOSIS — E1165 Type 2 diabetes mellitus with hyperglycemia: Secondary | ICD-10-CM

## 2023-10-26 DIAGNOSIS — K5521 Angiodysplasia of colon with hemorrhage: Secondary | ICD-10-CM

## 2023-10-26 DIAGNOSIS — F03A0 Unspecified dementia, mild, without behavioral disturbance, psychotic disturbance, mood disturbance, and anxiety: Secondary | ICD-10-CM

## 2023-10-26 DIAGNOSIS — D500 Iron deficiency anemia secondary to blood loss (chronic): Secondary | ICD-10-CM

## 2023-10-26 DIAGNOSIS — K922 Gastrointestinal hemorrhage, unspecified: Secondary | ICD-10-CM | POA: Diagnosis not present

## 2023-10-26 LAB — RENAL FUNCTION PANEL
Albumin: 3.4 g/dL — ABNORMAL LOW (ref 3.5–5.0)
Anion gap: 11 (ref 5–15)
BUN: 56 mg/dL — ABNORMAL HIGH (ref 8–23)
CO2: 24 mmol/L (ref 22–32)
Calcium: 8.7 mg/dL — ABNORMAL LOW (ref 8.9–10.3)
Chloride: 104 mmol/L (ref 98–111)
Creatinine, Ser: 2.72 mg/dL — ABNORMAL HIGH (ref 0.61–1.24)
GFR, Estimated: 22 mL/min — ABNORMAL LOW (ref >60)
Glucose, Bld: 158 mg/dL — ABNORMAL HIGH (ref 70–99)
Phosphorus: 3.4 mg/dL (ref 2.5–4.6)
Potassium: 4.9 mmol/L (ref 3.5–5.1)
Sodium: 139 mmol/L (ref 135–145)

## 2023-10-26 LAB — IRON AND TIBC
Iron: 32 ug/dL — ABNORMAL LOW (ref 45–182)
Saturation Ratios: 9 % — ABNORMAL LOW (ref 17.9–39.5)
TIBC: 350 ug/dL (ref 250–450)
UIBC: 319 ug/dL

## 2023-10-26 LAB — CBC
HCT: 24 % — ABNORMAL LOW (ref 39.0–52.0)
Hemoglobin: 7.2 g/dL — ABNORMAL LOW (ref 13.0–17.0)
MCH: 30.5 pg (ref 26.0–34.0)
MCHC: 30 g/dL (ref 30.0–36.0)
MCV: 101.7 fL — ABNORMAL HIGH (ref 80.0–100.0)
Platelets: 137 K/uL — ABNORMAL LOW (ref 150–400)
RBC: 2.36 MIL/uL — ABNORMAL LOW (ref 4.22–5.81)
RDW: 14 % (ref 11.5–15.5)
WBC: 10.1 K/uL (ref 4.0–10.5)
nRBC: 0.3 % — ABNORMAL HIGH (ref 0.0–0.2)

## 2023-10-26 LAB — GLUCOSE, CAPILLARY
Glucose-Capillary: 104 mg/dL — ABNORMAL HIGH (ref 70–99)
Glucose-Capillary: 165 mg/dL — ABNORMAL HIGH (ref 70–99)
Glucose-Capillary: 251 mg/dL — ABNORMAL HIGH (ref 70–99)
Glucose-Capillary: 284 mg/dL — ABNORMAL HIGH (ref 70–99)
Glucose-Capillary: 308 mg/dL — ABNORMAL HIGH (ref 70–99)
Glucose-Capillary: 39 mg/dL — CL (ref 70–99)
Glucose-Capillary: 48 mg/dL — ABNORMAL LOW (ref 70–99)
Glucose-Capillary: 63 mg/dL — ABNORMAL LOW (ref 70–99)
Glucose-Capillary: 93 mg/dL (ref 70–99)

## 2023-10-26 LAB — HEMOGLOBIN AND HEMATOCRIT, BLOOD
HCT: 25.7 % — ABNORMAL LOW (ref 39.0–52.0)
Hemoglobin: 7.7 g/dL — ABNORMAL LOW (ref 13.0–17.0)

## 2023-10-26 LAB — FERRITIN: Ferritin: 50 ng/mL (ref 24–336)

## 2023-10-26 MED ORDER — POLYVINYL ALCOHOL 1.4 % OP SOLN
1.0000 [drp] | OPHTHALMIC | Status: DC
  Administered 2023-10-26 – 2023-10-30 (×25): 1 [drp] via OPHTHALMIC
  Filled 2023-10-26: qty 15

## 2023-10-26 MED ORDER — PREDNISONE 5 MG PO TABS
15.0000 mg | ORAL_TABLET | Freq: Every day | ORAL | Status: DC
  Administered 2023-10-26 – 2023-10-29 (×4): 15 mg via ORAL
  Filled 2023-10-26 (×4): qty 3

## 2023-10-26 MED ORDER — FERROUS SULFATE 325 (65 FE) MG PO TABS
325.0000 mg | ORAL_TABLET | Freq: Every day | ORAL | Status: DC
  Administered 2023-10-26: 325 mg via ORAL
  Filled 2023-10-26: qty 1

## 2023-10-26 MED ORDER — SIMVASTATIN 40 MG PO TABS
40.0000 mg | ORAL_TABLET | Freq: Every day | ORAL | Status: DC
  Administered 2023-10-26 – 2023-10-30 (×5): 40 mg via ORAL
  Filled 2023-10-26 (×5): qty 1

## 2023-10-26 MED ORDER — OCTREOTIDE ACETATE 50 MCG/ML IJ SOLN
50.0000 ug | Freq: Two times a day (BID) | INTRAMUSCULAR | Status: DC
  Administered 2023-10-26 – 2023-10-30 (×9): 50 ug via SUBCUTANEOUS
  Filled 2023-10-26 (×9): qty 1

## 2023-10-26 MED ORDER — GLUCAGON HCL RDNA (DIAGNOSTIC) 1 MG IJ SOLR: 1.0000 mg | INTRAMUSCULAR | Status: AC

## 2023-10-26 MED ORDER — GLUCAGON HCL RDNA (DIAGNOSTIC) 1 MG IJ SOLR
INTRAMUSCULAR | Status: AC
  Administered 2023-10-26: 1 mg via INTRAMUSCULAR
  Filled 2023-10-26: qty 1

## 2023-10-26 MED ORDER — PANTOPRAZOLE SODIUM 40 MG IV SOLR
40.0000 mg | Freq: Two times a day (BID) | INTRAVENOUS | Status: DC
  Administered 2023-10-26 – 2023-10-29 (×7): 40 mg via INTRAVENOUS
  Filled 2023-10-26 (×7): qty 10

## 2023-10-26 MED ORDER — ESCITALOPRAM OXALATE 20 MG PO TABS
20.0000 mg | ORAL_TABLET | Freq: Every day | ORAL | Status: DC
Start: 2023-10-26 — End: 2023-10-30
  Administered 2023-10-26 – 2023-10-30 (×5): 20 mg via ORAL
  Filled 2023-10-26 (×5): qty 1

## 2023-10-26 MED ORDER — VITAMIN D 25 MCG (1000 UNIT) PO TABS
2000.0000 [IU] | ORAL_TABLET | Freq: Every day | ORAL | Status: DC
  Administered 2023-10-26 – 2023-10-30 (×5): 2000 [IU] via ORAL
  Filled 2023-10-26 (×5): qty 2

## 2023-10-26 MED ORDER — IRON SUCROSE 200 MG IVPB - SIMPLE MED
200.0000 mg | Freq: Once | Status: AC
  Administered 2023-10-26: 200 mg via INTRAVENOUS
  Filled 2023-10-26: qty 200

## 2023-10-26 MED ORDER — MIRTAZAPINE 15 MG PO TABS
7.5000 mg | ORAL_TABLET | Freq: Every day | ORAL | Status: DC
  Administered 2023-10-26 – 2023-10-29 (×5): 7.5 mg via ORAL
  Filled 2023-10-26 (×5): qty 1

## 2023-10-26 MED ORDER — UMECLIDINIUM-VILANTEROL 62.5-25 MCG/ACT IN AEPB
1.0000 | INHALATION_SPRAY | Freq: Every day | RESPIRATORY_TRACT | Status: DC
  Administered 2023-10-26 – 2023-10-30 (×5): 1 via RESPIRATORY_TRACT
  Filled 2023-10-26: qty 14

## 2023-10-26 MED ORDER — FUROSEMIDE 40 MG PO TABS
40.0000 mg | ORAL_TABLET | Freq: Every day | ORAL | Status: DC
  Administered 2023-10-26 – 2023-10-30 (×5): 40 mg via ORAL
  Filled 2023-10-26 (×4): qty 1
  Filled 2023-10-26: qty 2

## 2023-10-26 MED ORDER — MEMANTINE HCL 10 MG PO TABS
5.0000 mg | ORAL_TABLET | Freq: Two times a day (BID) | ORAL | Status: DC
  Administered 2023-10-26 – 2023-10-30 (×9): 5 mg via ORAL
  Filled 2023-10-26 (×9): qty 1

## 2023-10-26 NOTE — Progress Notes (Signed)
 Mobility Specialist - Progress Note   10/26/23 1011  Mobility  Activity Ambulated with assistance  Level of Assistance Contact guard assist, steadying assist  Assistive Device Other (Comment) (HHA)  Distance Ambulated (ft) 20 ft  Range of Motion/Exercises Active  Activity Response Tolerated well  Mobility Referral Yes  Mobility visit 1 Mobility  Mobility Specialist Start Time (ACUTE ONLY) 1000  Mobility Specialist Stop Time (ACUTE ONLY) 1010  Mobility Specialist Time Calculation (min) (ACUTE ONLY) 10 min   Pt requesting assistance to bathroom. Assisted to/from bathroom and returned to bed with all needs met. Son in room.   Erminio Leos,  Mobility Specialist Can be reached via Secure Chat

## 2023-10-26 NOTE — Consult Note (Addendum)
 Referring Provider: [facility]          Primary Care Physician:  Geofm Glade PARAS, MD Primary Gastroenterologist:  Dr. Aneita         Reason for Consultation: recurrent hematochezia and acute on chronic blood loss anemia                  ASSESSMENT /  PLAN    89 y.o. male with a history of iron  deficiency anemia, known bleeding cecal/right colonic angioectasias, history of adenomatous colon polyps, dementia, hypertension, hyperlipidemia, COPD, lung cancer status post radiation, CAD status post CABG who was readmitted with hematochezia and acute on chronic blood loss anemia.  Recurrent lower GI bleeding/known right colonic angioectasias --presumed recurrent bleeding from right sided colonic angioectasias.  Cannot fully exclude a diverticular hemorrhage but given his history angioectasia source is favored.  We discussed the risks and benefits of repeat colonoscopy and for now we will observe but reserve the option of colonoscopy if bleeding continues.  It is noted that he has Alzheimer's versus vascular type dementia and we would want to be cautious with repeated anesthesia episodes when possible.  We discussed adding subcutaneous octreotide  and short acting formulation to see if this is tolerated and if it is possibly Depo octreotide  could be given as an outpatient. -- Full liquid diet for now -- Trend hemoglobin every 8 hours, transfusion threshold 7.0 -- I added ferritin and TIBC to recently drawn labs; supplement iron  if needed -- Subcutaneous octreotide  every 12 hours for now to see if he tolerates this and if so possible Depo long-acting formulation as an outpatient -- Colonoscopy discussed, not prepping at this point. -- Would hold aspirin  likely indefinitely  2.  HFpEF --euvolemic, continuing home Lasix   3.  COPD with chronic hypoxic respiratory failure -- On 4 L of oxygen , continuing on home Anoro Ellipta , currently on prednisone  taper -- Daily PPI while on  prednisone   4.  Vascular dementia --see #1  5.  CKD type IV, mild hyperkalemia --per hospitalist medicine  HPI:     Brian [name] is a 89 y.o. male with a history of iron  deficiency anemia, known bleeding cecal/right colonic angioectasias, history of adenomatous colon polyps, dementia, hypertension, hyperlipidemia, COPD, lung cancer status post radiation, CAD status post CABG who was readmitted with hematochezia and acute on chronic blood loss anemia.  He is seen today with his son at bedside.  It is pertinent that he was admitted with a similar lower GI bleed in early July 2025.  On 08/30/2023 a nuclear medicine bleeding scan showed active bleeding in the cecum.  At that time he underwent colonoscopy on 08/27/2023 with Dr. Legrand.  This revealed 5 angioectasias, 3 in the cecum 2 of which were actively bleeding and 2 in the ascending colon.  APC ablation and Endo clipping was performed.  At that time hemoglobin stabilized and he was eventually discharged home.  At discharge his hemoglobin was 8.3.  It had increased to 10.3 on 09/18/2023 when he followed up with Dr. Geofm, his PCP.  On readmission here his hemoglobin was 8.6 and a 7.2 most recently (though all cell lines decreased between these 2 readings indicating probable dilution effect).  He had painless recurrent hematochezia as documented by his family.  I was able to see a picture which showed soft brown stool with maroon blood mixed in.  His family noted that he had had some mild increase in lethargy and fatigue for about 24  hours prior to presentation.  He has not had abdominal pain with this.  He has had a boil on his right upper buttocks which his family was treating with Epsom salt bath which is causing minor discomfort but improving.  He has had these lesions before several requiring I&D.  He has not had abdominal pain, nausea or vomiting.  No heartburn or dysphagia symptom.  He does not take anticoagulation other than baby aspirin .  His  son notes that he did have subjective decline in his short-term memory and mentation post anesthesia associated with colonoscopy in early July.  For this reason there is some hesitancy to repeat this unless absolutely necessary.  In February 2024 to further evaluate iron  deficiency anemia he had an EGD which was normal.  A colonoscopy on the same day revealed 2 right sided AVMs which were ablated.  He also has diverticulosis.  Past Medical History:  Diagnosis Date   AAA (abdominal aortic aneurysm) (HCC)    Arthritis    Asthma 1976   Bronchiolitis 06/2014   CAD (coronary artery disease)    NSTEMI 02/2010;  s/p CABG 1/12 (L-LAD, SOM1, S-PDA); echo in 08/2010: Mild LVH, EF 55%, grade 2 diastolic dysfunction, mild MR, mild LAE, mildly reduced RV function, mild RAE, PASP 42   Carpal tunnel syndrome of left wrist    Cecal angiodysplasia 04/04/2022   Colon polyps    COPD (chronic obstructive pulmonary disease) (HCC)    Diabetes mellitus 2000   Type II, controlls with diet   Dyspnea    ED (erectile dysfunction)    Gout    History of radiation therapy 03/09/2020-03/16/2020   SBRT right lung: Dr. Lynwood Nasuti   HTN (hypertension)    Hyperlipidemia    Inguinal hernia    Right   Macular degeneration    Myocardial infarction [facility]) 1 -13-2012   Neuropathy     Past Surgical History:  Procedure Laterality Date   3 vessel CABG  03/03/2010   ABDOMINAL AORTIC ENDOVASCULAR STENT GRAFT N/A 01/21/2019   Procedure: ABDOMINAL AORTIC ENDOVASCULAR STENT GRAFT;  Surgeon: Sheree Penne Bruckner, MD;  Location: [facility] OR;  Service: Vascular;  Laterality: N/A;   cataract left eye     COLONOSCOPY     neg 2009   COLONOSCOPY  2014   5 mm sessile polyp; AVM   COLONOSCOPY N/A 08/31/2023   Procedure: COLONOSCOPY;  Surgeon: Legrand Victory LITTIE DOUGLAS, MD;  Location: [facility] ENDOSCOPY;  Service: Gastroenterology;  Laterality: N/A;   COLONOSCOPY WITH PROPOFOL  N/A 04/04/2022   Procedure: COLONOSCOPY WITH PROPOFOL ;  Surgeon:  Avram Lupita BRAVO, MD;  Location: [facility] ENDOSCOPY;  Service: Gastroenterology;  Laterality: N/A;   CORONARY ARTERY BYPASS GRAFT  2012   X3   ENDOVASCULAR REPAIR/STENT GRAFT  01/21/2019   ABDOMINAL AORTIC ENDOVASCULAR STENT GRAFT (N/A )   ESOPHAGOGASTRODUODENOSCOPY (EGD) WITH PROPOFOL  N/A 04/04/2022   Procedure: ESOPHAGOGASTRODUODENOSCOPY (EGD) WITH PROPOFOL ;  Surgeon: Avram Lupita BRAVO, MD;  Location: [facility] ENDOSCOPY;  Service: Gastroenterology;  Laterality: N/A;   EYE SURGERY     Left cataract   HERNIA REPAIR     HOT HEMOSTASIS N/A 04/04/2022   Procedure: HOT HEMOSTASIS (ARGON PLASMA COAGULATION/BICAP);  Surgeon: Avram Lupita BRAVO, MD;  Location: [facility]OSCOPY;  Service: Gastroenterology;  Laterality: N/A;   INCISION AND DRAINAGE PERIRECTAL ABSCESS     INGUINAL HERNIA REPAIR      Prior to Admission medications   Medication Sig Start Date End Date Taking? Authorizing Provider  albuterol  (VENTOLIN  HFA) 108 (90  Base) MCG/ACT inhaler Inhale 2 puffs into the lungs every 6 (six) hours as needed for wheezing or shortness of breath. 10/18/23  Yes Burns, Glade PARAS, MD  ANORO ELLIPTA  62.5-25 MCG/ACT AEPB USE 1 INHALATION DAILY Patient taking differently: Inhale 1 puff into the lungs in the morning. 09/30/23  Yes Burns, Glade PARAS, MD  Carboxymethylcellulose Sodium 0.25 % SOLN Place 1 drop into both eyes every 4 (four) hours.   Yes [provider]  Cholecalciferol  50 MCG (2000 UT) TABS Take 2,000 Units by mouth in the morning.   Yes [provider]  cyanocobalamin  (VITAMIN B12) 500 MCG tablet Take 1,000 mcg by mouth daily.   Yes [provider]  escitalopram  (LEXAPRO ) 20 MG tablet Take 20 mg by mouth in the morning.   Yes [provider]  ferrous sulfate  325 (65 FE) MG tablet Take 1 tablet (325 mg total) by mouth daily with breakfast. 09/28/22  Yes Burns, Glade PARAS, MD  furosemide  (LASIX ) 40 MG tablet Take 1 tablet (40 mg total) by mouth daily. 09/03/23  Yes Rosario Leatrice FERNS, MD   memantine  (NAMENDA ) 10 MG tablet Take 5 mg by mouth in the morning and at bedtime.   Yes [provider]  mirtazapine  (REMERON ) 15 MG tablet Take 0.5 tablets (7.5 mg total) by mouth at bedtime. 10/07/23  Yes Burns, Glade PARAS, MD  OXYGEN  Inhale 6 L/min into the lungs continuous.   Yes [provider]  predniSONE  (DELTASONE ) 10 MG tablet Take 1.5 tablets (15mg  total) daily with breakfast 10/10/23  Yes Hope Almarie ORN, NP  simvastatin  (ZOCOR ) 40 MG tablet Take 1 tablet (40 mg total) by mouth daily. 02/27/23  Yes Burns, Glade PARAS, MD  sulfamethoxazole -trimethoprim  (BACTRIM  DS) 800-160 MG tablet Take 1 tablet by mouth 3 (three) times a week. Patient taking differently: Take 1 tablet by mouth every Monday, Wednesday, and Friday. 10/18/23  Yes Shelah Lamar RAMAN, MD    Current Facility-Administered Medications  Medication Dose Route Frequency Provider Last Rate Last Admin   acetaminophen  (TYLENOL ) tablet 650 mg  650 mg Oral Q6H PRN Amponsah, Prosper M, MD       Or   acetaminophen  (TYLENOL ) suppository 650 mg  650 mg Rectal Q6H PRN Amponsah, Prosper M, MD       artificial tears ophthalmic solution 1 drop  1 drop Both Eyes Q4H Amponsah, Prosper M, MD   1 drop at 10/26/23 9072   bisacodyl  (DULCOLAX) EC tablet 5 mg  5 mg Oral Daily PRN Amponsah, Prosper M, MD       cholecalciferol  (VITAMIN D3) 25 MCG (1000 UNIT) tablet 2,000 Units  2,000 Units Oral Daily Lou Claretta HERO, MD   2,000 Units at 10/26/23 9078   escitalopram  (LEXAPRO ) tablet 20 mg  20 mg Oral Daily Amponsah, Prosper M, MD   20 mg at 10/26/23 9077   ferrous sulfate  tablet 325 mg  325 mg Oral Q breakfast Amponsah, Prosper M, MD   325 mg at 10/26/23 9078   furosemide  (LASIX ) tablet 40 mg  40 mg Oral Daily Amponsah, Prosper M, MD       insulin  aspart (novoLOG ) injection 0-15 Units  0-15 Units Subcutaneous Q4H Lou Claretta HERO, MD   8 Units at 10/26/23 0024   memantine  (NAMENDA ) tablet 5 mg  5 mg Oral BID Amponsah, Prosper M, MD   5  mg at 10/26/23 9078   mirtazapine  (REMERON ) tablet 7.5 mg  7.5 mg Oral QHS Amponsah, Prosper M, MD   7.5 mg  at 10/26/23 0033   ondansetron  (ZOFRAN ) tablet 4 mg  4 mg Oral Q6H PRN Lou Claretta HERO, MD       Or   ondansetron  (ZOFRAN ) injection 4 mg  4 mg Intravenous Q6H PRN Lou Claretta HERO, MD       pantoprazole  (PROTONIX ) injection 40 mg  40 mg Intravenous Q12H Amponsah, Prosper M, MD   40 mg at 10/26/23 9077   predniSONE  (DELTASONE ) tablet 15 mg  15 mg Oral Q breakfast Lou Claretta HERO, MD   15 mg at 10/26/23 9077   senna-docusate (Senokot-S) tablet 1 tablet  1 tablet Oral QHS PRN Lou Claretta HERO, MD       simvastatin  (ZOCOR ) tablet 40 mg  40 mg Oral Daily Lou Claretta HERO, MD   40 mg at 10/26/23 9077   umeclidinium-vilanterol (ANORO ELLIPTA ) 62.5-25 MCG/ACT 1 puff  1 puff Inhalation Daily Lou Claretta HERO, MD   1 puff at 10/26/23 0801    Allergies as of 10/25/2023 - Review Complete 10/25/2023  Allergen Reaction Noted   Meperidine hcl Swelling and Other (See Comments) 09/24/2006   Sertraline  Shortness Of Breath and Other (See Comments) 05/01/2021   Gabapentin  Other (See Comments) 05/11/2019   Memantine  Other (See Comments) 04/02/2022   Promethazine Swelling and Other (See Comments) 10/25/2023   Latex Rash and Other (See Comments) 05/08/2022   Zolpidem tartrate Other (See Comments)     Family History  Problem Relation Age of Onset   Heart failure Mother    Heart disease Mother        CHF   Heart attack Mother    Hypertension Mother    Heart attack Father    Heart disease Father 18       MI   Hypertension Father    Heart attack Sister 36   Heart disease Sister 62       MI   Diabetes Sister    Hyperlipidemia Sister    Hypertension Sister    Varicose Veins Sister    Heart attack Brother 89   Diabetes Brother    Heart disease Brother 52       MI   Cancer Brother    Hypertension Brother    Deep vein thrombosis Daughter    Leukemia Daughter    Other Son         varicose veins   Arthritis/Rheumatoid Son    Lupus Daughter    Colon cancer Neg Hx     Social History   Tobacco Use   Smoking status: Former    Current packs/day: 0.00    Average packs/day: 1 pack/day for 40.0 years (40.0 ttl pk-yrs)    Types: Cigarettes    Start date: 02/20/1963    Quit date: 02/20/2003    Years since quitting: 20.6    Passive exposure: Past   Smokeless tobacco: Never  Vaping Use   Vaping status: Never Used  Substance Use Topics   Alcohol  use: Not Currently    Alcohol /week: 1.0 standard drink of alcohol     Types: 1 Shots of liquor per week    Comment: 1 drinks a week   Drug use: No    Review of Systems: All systems reviewed and negative except where noted in HPI.  Physical Exam: Vital signs in last 24 hours: Temp:  [97.6 F (36.4 C)-98.3 F (36.8 C)] 98.2 F (36.8 C) (09/06 0837) Pulse Rate:  [65-71] 69 (09/06 0837) Resp:  [16-22] 16 (09/06 0837) BP: (137-162)/(57-67) 160/66 (09/06 0928) SpO2:  [  95 %-100 %] 100 % (09/06 0928) FiO2 (%):  [36 %] 36 % (09/06 0801) Weight:  [69.9 kg] 69.9 kg (09/06 0555) Last BM Date : 10/25/23 Gen: awake, alert, NAD HEENT: anicteric  CV: RRR Pulm: CTA b/l Abd: soft, NT/ND, +BS throughout Ext: no c/c/e Neuro: nonfocal    Intake/Output from previous day: No intake/output data recorded. Intake/Output this shift: No intake/output data recorded.  Lab Results: Recent Labs    10/25/23 1707 10/26/23 0539  WBC 11.8* 10.1  HGB 8.6* 7.2*  HCT 29.3* 24.0*  PLT 182 137*   BMET Recent Labs    10/25/23 1707 10/26/23 0539  NA 140 139  K 5.4* 4.9  CL 103 104  CO2 24 24  GLUCOSE 242* 158*  BUN 54* 56*  CREATININE 2.72* 2.72*  CALCIUM 9.1 8.7*   LFT Recent Labs    10/25/23 1707 10/26/23 0539  PROT 6.1*  --   ALBUMIN 3.9 3.4*  AST 14*  --   ALT 15  --   ALKPHOS 65  --   BILITOT 0.2  --     .    Latest Ref Rng & Units 10/26/2023    5:39 AM 10/25/2023    5:07 PM 09/18/2023   11:49 AM  CBC   WBC 4.0 - 10.5 K/uL 10.1  11.8  11.7   Hemoglobin 13.0 - 17.0 g/dL 7.2  8.6  89.6   Hematocrit 39.0 - 52.0 % 24.0  29.3  31.8   Platelets 150 - 400 K/uL 137  182  185.0     .    Latest Ref Rng & Units 10/26/2023    5:39 AM 10/25/2023    5:07 PM 09/18/2023   11:49 AM  CMP  Glucose 70 - 99 mg/dL 841  757  748   BUN 8 - 23 mg/dL 56  54  46   Creatinine 0.61 - 1.24 mg/dL 7.27  7.27  6.99   Sodium 135 - 145 mmol/L 139  140  143   Potassium 3.5 - 5.1 mmol/L 4.9  5.4  5.1   Chloride 98 - 111 mmol/L 104  103  101   CO2 22 - 32 mmol/L 24  24  32   Calcium 8.9 - 10.3 mg/dL 8.7  9.1  9.4   Total Protein 6.5 - 8.1 g/dL  6.1    Total Bilirubin 0.0 - 1.2 mg/dL  0.2    Alkaline Phos 38 - 126 U/L  65    AST 15 - 41 U/L  14    ALT 0 - 44 U/L  15     Studies/Results: DG Chest 2 View Result Date: 10/25/2023 CLINICAL DATA:  Shortness of breath EXAM: CHEST - 2 VIEW COMPARISON:  08/27/2023 and CT scan from 08/28/2023 FINDINGS: Atherosclerotic calcification of the aortic arch. Band of scarring in the right upper lobe appears stable. Blunted right costophrenic angle compatible with right pleural effusion. Prior CABG. Emphysema. Thoracic spondylosis. Overall similar appearance to the 08/27/2023 exam. IMPRESSION: 1. Small right pleural effusion. 2. Stable band of scarring in the right upper lobe. 3. Emphysema. 4. Thoracic spondylosis. 5. Aortic Atherosclerosis (ICD10-I70.0). Electronically Signed   By: Ryan Salvage M.D.   On: 10/25/2023 17:49    Principal Problem:   GI bleed Active Problems:   COPD without exacerbation (HCC)   CKD (chronic kidney disease), stage IV (HCC)   Chronic heart failure with preserved ejection fraction (HCC)   Chronic hypoxic respiratory failure (HCC)   Type 2  diabetes mellitus with hyperglycemia, without long-term current use of insulin  (HCC)    Gordy HERO. Nimrat Woolworth, M.D. @  10/26/2023, 10:01 AM

## 2023-10-26 NOTE — Progress Notes (Signed)
 Hypoglycemic Event  CBG: 39  Treatment: 8 oz orange juice, ensure  Symptoms: Sweaty, shaky.  Follow-up CBG:15 min Time:0015  CBG Result: 93  Possible Reasons for Event: Pt on full liquid diet, with sliding scale insulin . CBG at 2000 was 308. 11 units of insulin  given.  Comments/MD notified: Lavanda Horns, NP notified. Q1 hr x 2 CBG checked. Latest CBG at 0415 is 151. Will continue to monitor pt.    Brian [name], Brian [name]

## 2023-10-26 NOTE — Progress Notes (Signed)
 PROGRESS NOTE  Brian [name]  FMW:[id number] DOB: 05/31/1931 DOA: 10/25/2023 PCP: Geofm Glade PARAS, MD   Brief Narrative: Patient is a 89 year old male with history of coronary artery disease status post CABG, COPD, chronic hypoxic respiratory failure on 4 to 6 L of oxygen  per minute, CKD stage IV, diabetes type 2, hypertension, hyperlipidemia, Alzheimer's/vascular dementia, aortic aneurysm, history of GI bleed from cecal AVMs who presented for the evaluation of bloody stools.  Report of dark stool for the last few days.  Also reported dyspnea on exertion, fatigue, mild shortness of breath.  On presentation, he was hemodynamically stable.  On baseline oxygen  of 4 L, maintaining saturation.  Hemoglobin was 8.6, creatinine of 2.7, elevated proBNP of 1612.  EKG showed nonspecific changes.  Chest x-ray showed small right pleural effusion.  GI was consulted and patient admitted for the management of  GI bleed.  Assessment & Plan:  Principal Problem:   GI bleed Active Problems:   Chronic heart failure with preserved ejection fraction (HCC)   CKD (chronic kidney disease), stage IV (HCC)   COPD without exacerbation (HCC)   Chronic hypoxic respiratory failure (HCC)   Type 2 diabetes mellitus with hyperglycemia, without long-term current use of insulin  (HCC)   Acute blood loss anemia/GI bleed/history of AVMs: Baseline hemoglobin around 9.  Presented with hemoglobin of 8.6.  Report of black tarry stools at home.  History of AVMs.  GI consulted.  Currently on Protonix  drip.  Continue iron  supplementation.  Hemoglobin in the range of 7 this morning.  Continue to monitor.  Transfuse if hemoglobin drops less than 7. Iron  studies showed low iron , will give him IV iron . Suspected cecal AVM.  GI ordering octreotide .  No hematochezia today  CKD stage IV/hyperkalemia: Baseline creatinine of 2.5-2.7.  Currently kidney function at baseline.  Hyperkalemia resolved.  Monitor kidney function  COPD/chronic hypoxic  respiratory failure: On 4 to 6 L of oxygen  at home.  Currently on the same.  Continue home inhalers.  On prednisone  at home which will be continued.  Continue bronchodilators as needed  Type 2 diabetes: Admission A1c of 7.1.  Not on any medication at home.  Might need to start on oral medication on discharge.  Continue sliding scale.  Continue to monitor  HFpEF: Patient appears euvolemic on exam.  Elevated BNP on admission.  Continue home Lasix .  Monitor daily input/output, daily weight  Hypertension: Mildly hypertensive today.  Monitor blood pressure  Coronary artery disease/hyperlipidemia: Status post CABG.  No chest pain.  No anginal symptoms.  Continue simvastatin   Mood disorder: Lexapro , mirtazapine   History of vascular dementia: On memantine .  Continue delirium precaution, frequent orientation.  He lives with daughter.  Will consult PT when appropriate            DVT prophylaxis:SCDs Start: 10/25/23 1936     Code Status: Full Code  Family Communication: Son at the bedside  Patient status:Inpatient  Patient is from :home  Anticipated discharge to: Home  Estimated DC date:1-2 days   Consultants: GI  Procedures: None  Antimicrobials:  Anti-infectives (From admission, onward)    None       Subjective: Patient seen and examined at bedside today.  Very comfortable.  Lying on bed.  No hematochezia or melena today.  Son at the bedside.  Denies any abdomen pain, nausea or vomiting.  Alert  and oriented  Objective: Vitals:   10/25/23 2131 10/26/23 0017 10/26/23 0416 10/26/23 0555  BP:  (!) 144/57 (!) 137/59  Pulse:  65 71   Resp:  18 18   Temp:  97.6 F (36.4 C) 97.7 F (36.5 C)   TempSrc:  Oral Oral   SpO2:  99% 95%   Weight:    69.9 kg  Height: 5' 11 (1.803 m)      No intake or output data in the 24 hours ending 10/26/23 0801 Filed Weights   10/26/23 0555  Weight: 69.9 kg    Examination:  General exam: Overall comfortable, not in distress,  pleasant elderly male HEENT: PERRL Respiratory system:  no wheezes or crackles  Cardiovascular system: S1 & S2 heard, RRR.  Gastrointestinal system: Abdomen is nondistended, soft and nontender. Central nervous system: Alert and oriented Extremities: No edema, no clubbing ,no cyanosis Skin: No rashes, no ulcers,no icterus     Data Reviewed: I have personally reviewed following labs and imaging studies  CBC: Recent Labs  Lab 10/25/23 1707 10/26/23 0539  WBC 11.8* 10.1  HGB 8.6* 7.2*  HCT 29.3* 24.0*  MCV 101.7* 101.7*  PLT 182 137*   Basic Metabolic Panel: Recent Labs  Lab 10/25/23 1707 10/26/23 0539  NA 140 139  K 5.4* 4.9  CL 103 104  CO2 24 24  GLUCOSE 242* 158*  BUN 54* 56*  CREATININE 2.72* 2.72*  CALCIUM 9.1 8.7*  MG 2.3  --   PHOS  --  3.4     No results found for this or any previous visit (from the past 240 hours).   Radiology Studies: DG Chest 2 View Result Date: 10/25/2023 CLINICAL DATA:  Shortness of breath EXAM: CHEST - 2 VIEW COMPARISON:  08/27/2023 and CT scan from 08/28/2023 FINDINGS: Atherosclerotic calcification of the aortic arch. Band of scarring in the right upper lobe appears stable. Blunted right costophrenic angle compatible with right pleural effusion. Prior CABG. Emphysema. Thoracic spondylosis. Overall similar appearance to the 08/27/2023 exam. IMPRESSION: 1. Small right pleural effusion. 2. Stable band of scarring in the right upper lobe. 3. Emphysema. 4. Thoracic spondylosis. 5. Aortic Atherosclerosis (ICD10-I70.0). Electronically Signed   By: Ryan Salvage M.D.   On: 10/25/2023 17:49    Scheduled Meds:  artificial tears  1 drop Both Eyes Q4H   cholecalciferol   2,000 Units Oral Daily   escitalopram   20 mg Oral Daily   ferrous sulfate   325 mg Oral Q breakfast   furosemide   40 mg Oral Daily   insulin  aspart  0-15 Units Subcutaneous Q4H   memantine   5 mg Oral BID   mirtazapine   7.5 mg Oral QHS   pantoprazole  (PROTONIX ) IV  40 mg  Intravenous Q12H   predniSONE   15 mg Oral Q breakfast   simvastatin   40 mg Oral Daily   umeclidinium-vilanterol  1 puff Inhalation Daily   Continuous Infusions:   LOS: 1 day   Ivonne Mustache, MD [facility] P9/07/2023, 8:01 AM

## 2023-10-26 NOTE — Progress Notes (Signed)
 Hypoglycemic Event  CBG: 63  Treatment: Glucagon  IM 1 mg  Symptoms: None  Follow-up CBG: Time:0555 CBG Result:165  Possible Reasons for Event: Inadequate meal intake  Comments/MD notified:On call provider notified- A. Andrez, will continue to monitor per orders    [facility]

## 2023-10-27 DIAGNOSIS — D509 Iron deficiency anemia, unspecified: Secondary | ICD-10-CM | POA: Diagnosis not present

## 2023-10-27 DIAGNOSIS — K922 Gastrointestinal hemorrhage, unspecified: Secondary | ICD-10-CM | POA: Diagnosis not present

## 2023-10-27 LAB — BASIC METABOLIC PANEL WITH GFR
Anion gap: 12 (ref 5–15)
BUN: 50 mg/dL — ABNORMAL HIGH (ref 8–23)
CO2: 25 mmol/L (ref 22–32)
Calcium: 8.8 mg/dL — ABNORMAL LOW (ref 8.9–10.3)
Chloride: 103 mmol/L (ref 98–111)
Creatinine, Ser: 2.46 mg/dL — ABNORMAL HIGH (ref 0.61–1.24)
GFR, Estimated: 24 mL/min — ABNORMAL LOW (ref >60)
Glucose, Bld: 152 mg/dL — ABNORMAL HIGH (ref 70–99)
Potassium: 5.5 mmol/L — ABNORMAL HIGH (ref 3.5–5.1)
Sodium: 139 mmol/L (ref 135–145)

## 2023-10-27 LAB — GLUCOSE, CAPILLARY
Glucose-Capillary: 143 mg/dL — ABNORMAL HIGH (ref 70–99)
Glucose-Capillary: 151 mg/dL — ABNORMAL HIGH (ref 70–99)
Glucose-Capillary: 163 mg/dL — ABNORMAL HIGH (ref 70–99)
Glucose-Capillary: 173 mg/dL — ABNORMAL HIGH (ref 70–99)
Glucose-Capillary: 182 mg/dL — ABNORMAL HIGH (ref 70–99)
Glucose-Capillary: 244 mg/dL — ABNORMAL HIGH (ref 70–99)
Glucose-Capillary: 382 mg/dL — ABNORMAL HIGH (ref 70–99)
Glucose-Capillary: 93 mg/dL (ref 70–99)

## 2023-10-27 LAB — HEMOGLOBIN AND HEMATOCRIT, BLOOD
HCT: 25.7 % — ABNORMAL LOW (ref 39.0–52.0)
HCT: 27.3 % — ABNORMAL LOW (ref 39.0–52.0)
Hemoglobin: 8 g/dL — ABNORMAL LOW (ref 13.0–17.0)
Hemoglobin: 8 g/dL — ABNORMAL LOW (ref 13.0–17.0)

## 2023-10-27 MED ORDER — FERROUS SULFATE 325 (65 FE) MG PO TABS
325.0000 mg | ORAL_TABLET | Freq: Every day | ORAL | Status: DC
  Administered 2023-10-28 – 2023-10-30 (×3): 325 mg via ORAL
  Filled 2023-10-27 (×3): qty 1

## 2023-10-27 MED ORDER — SODIUM ZIRCONIUM CYCLOSILICATE 10 G PO PACK
10.0000 g | PACK | Freq: Once | ORAL | Status: AC
  Administered 2023-10-27: 10 g via ORAL
  Filled 2023-10-27: qty 1

## 2023-10-27 MED ORDER — HYDRALAZINE HCL 20 MG/ML IJ SOLN
5.0000 mg | Freq: Once | INTRAMUSCULAR | Status: AC
  Administered 2023-10-27: 5 mg via INTRAVENOUS
  Filled 2023-10-27: qty 1

## 2023-10-27 MED ORDER — INSULIN ASPART 100 UNIT/ML IJ SOLN
0.0000 [IU] | Freq: Three times a day (TID) | INTRAMUSCULAR | Status: DC
  Administered 2023-10-27: 5 [IU] via SUBCUTANEOUS
  Administered 2023-10-27 – 2023-10-28 (×2): 2 [IU] via SUBCUTANEOUS
  Administered 2023-10-28: 5 [IU] via SUBCUTANEOUS
  Administered 2023-10-28: 2 [IU] via SUBCUTANEOUS
  Administered 2023-10-29: 1 [IU] via SUBCUTANEOUS

## 2023-10-27 NOTE — Progress Notes (Signed)
 Gastroenterology Inpatient Follow Up    Subjective: Son is at bedside today. Passed a mostly brown stool that was slightly tinged with blood. Son states that this appears to be less bloody than his prior stools. He is tolerating his octreotide  injections well. He did have an episode of hypoglycemia last night after getting a shot of insulin  that resolved after he drank some orange juice.   Objective: Vital signs in last 24 hours: Temp:  [97.7 F (36.5 C)-98.7 F (37.1 C)] 97.7 F (36.5 C) (09/07 0411) Pulse Rate:  [63-86] 63 (09/07 0556) Resp:  [18-22] 18 (09/07 0411) BP: (136-175)/(58-76) 136/69 (09/07 0556) SpO2:  [93 %-98 %] 98 % (09/07 0845) FiO2 (%):  [32 %] 32 % (09/07 0845) Last BM Date : 10/27/23 (this AM per son at bedside)  Intake/Output from previous day: 09/06 0701 - 09/07 0700 In: 870 [P.O.:760; IV Piggyback:110] Out: 225 [Urine:225] Intake/Output this shift: Total I/O In: 120 [P.O.:120] Out: -   General appearance: sleepy but able to answer some questions Resp: no increased WOB, on home nasal cannula oxygen  Cardio: regular rate GI: non-tender, non-distended  Lab Results: Recent Labs    10/25/23 1707 10/26/23 0539 10/26/23 1621 10/27/23 0537  WBC 11.8* 10.1  --   --   HGB 8.6* 7.2* 7.7* 8.0*  HCT 29.3* 24.0* 25.7* 25.7*  PLT 182 137*  --   --    BMET Recent Labs    10/25/23 1707 10/26/23 0539 10/27/23 0537  NA 140 139 139  K 5.4* 4.9 5.5*  CL 103 104 103  CO2 24 24 25   GLUCOSE 242* 158* 152*  BUN 54* 56* 50*  CREATININE 2.72* 2.72* 2.46*  CALCIUM 9.1 8.7* 8.8*   LFT Recent Labs    10/25/23 1707 10/26/23 0539  PROT 6.1*  --   ALBUMIN 3.9 3.4*  AST 14*  --   ALT 15  --   ALKPHOS 65  --   BILITOT 0.2  --    PT/INR No results for input(s): LABPROT, INR in the last 72 hours. Hepatitis Panel No results for input(s): HEPBSAG, HCVAB, HEPAIGM, HEPBIGM in the last 72 hours. C-Diff No results for input(s): CDIFFTOX in  the last 72 hours.  Studies/Results: DG Chest 2 View Result Date: 10/25/2023 CLINICAL DATA:  Shortness of breath EXAM: CHEST - 2 VIEW COMPARISON:  08/27/2023 and CT scan from 08/28/2023 FINDINGS: Atherosclerotic calcification of the aortic arch. Band of scarring in the right upper lobe appears stable. Blunted right costophrenic angle compatible with right pleural effusion. Prior CABG. Emphysema. Thoracic spondylosis. Overall similar appearance to the 08/27/2023 exam. IMPRESSION: 1. Small right pleural effusion. 2. Stable band of scarring in the right upper lobe. 3. Emphysema. 4. Thoracic spondylosis. 5. Aortic Atherosclerosis (ICD10-I70.0). Electronically Signed   By: Ryan Salvage M.D.   On: 10/25/2023 17:49    Medications: I have reviewed the patient's current medications. Scheduled:  artificial tears  1 drop Both Eyes Q4H   cholecalciferol   2,000 Units Oral Daily   escitalopram   20 mg Oral Daily   furosemide   40 mg Oral Daily   insulin  aspart  0-6 Units Subcutaneous TID WC   memantine   5 mg Oral BID   mirtazapine   7.5 mg Oral QHS   octreotide   50 mcg Subcutaneous Q12H   pantoprazole  (PROTONIX ) IV  40 mg Intravenous Q12H   predniSONE   15 mg Oral Q breakfast   simvastatin   40 mg Oral Daily   umeclidinium-vilanterol  1 puff  Inhalation Daily   Continuous: PRN:acetaminophen  **OR** acetaminophen , bisacodyl , ondansetron  **OR** ondansetron  (ZOFRAN ) IV, senna-docusate  Assessment/Plan: 89 y.o. male with a history of iron  deficiency anemia, known bleeding cecal/right colonic angioectasias, history of adenomatous colon polyps, dementia, hypertension, hyperlipidemia, COPD, lung cancer status post radiation, CAD status post CABG presented with hematochezia and acute on chronic blood loss anemia.   Recurrent lower GI bleeding/known right colonic angioectasias/iron  deficiency anemia --presumed recurrent bleeding from right sided colonic angioectasias.  Cannot fully exclude a diverticular hemorrhage but  given his history, angioectasia source is favored.  Hb is stable today. Patient's stools appear to be becoming less bloody, which is reassuring. Labs from yesterday do show iron  deficiency anemia so will start on iron  supplementation. Patient and his family are hesitant to pursue repeat colonoscopy at this time due to concerns about sedation side effects,. And it does appear that his bleeding is resolving on its own. -- Full liquid diet for now. Can advance tomorrow if Hb continues to remain stable. -- Trend hemoglobin every 8 hours, transfusion threshold 7.0 -- Will start oral iron  due to iron  deficiency -- Subcutaneous octreotide  every 12 hours and will try to get depo long-acting formulation approved by his insurance as an outpatient -- Would hold aspirin  likely indefinitely   2.  HFpEF --euvolemic, continuing home Lasix    3.  COPD with chronic hypoxic respiratory failure -- On 4 L of oxygen , continuing on home Anoro Ellipta , currently on prednisone  taper -- Daily PPI while on prednisone    4.  Vascular dementia --see #1   5.  CKD type IV, mild hyperkalemia --per hospitalist medicine    LOS: 2 days   Rosario JAYSON Kidney 10/27/2023, 10:08 AM

## 2023-10-27 NOTE — Progress Notes (Addendum)
 PROGRESS NOTE  Brian [name]  FMW:[id number] DOB: 07-08-1931 DOA: 10/25/2023 PCP: Geofm Glade PARAS, MD   Brief Narrative: Patient is a 89 year old male with history of coronary artery disease status post CABG, COPD, chronic hypoxic respiratory failure on 4 to 6 L of oxygen  per minute, CKD stage IV, diabetes type 2, hypertension, hyperlipidemia, Alzheimer's/vascular dementia, aortic aneurysm, history of GI bleed from cecal AVMs who presented for the evaluation of bloody stools.  Report of dark stool for the last few days.  Also reported dyspnea on exertion, fatigue, mild shortness of breath.  On presentation, he was hemodynamically stable.  On baseline oxygen  of 4 L, maintaining saturation.  Hemoglobin was 8.6, creatinine of 2.7, elevated proBNP of 1612.  EKG showed nonspecific changes.  Chest x-ray showed small right pleural effusion.  GI was consulted and patient admitted for the management of  GI bleed.  Clinically improving.  Possible discharge tomorrow  Assessment & Plan:  Principal Problem:   GI bleed Active Problems:   Chronic heart failure with preserved ejection fraction (HCC)   CKD (chronic kidney disease), stage IV (HCC)   COPD without exacerbation (HCC)   Chronic hypoxic respiratory failure (HCC)   Type 2 diabetes mellitus with hyperglycemia, without long-term current use of insulin  (HCC)   Acute blood loss anemia/GI bleed/history of AVMs: Baseline hemoglobin around 9.  Presented with hemoglobin of 8.6.  Report of black tarry stools at home.  History of AVMs.  GI consulted.  Currently on Protonix .  Continue iron  supplementation.  Hemoglobin in the range of 8 this morning.  Continue to monitor.  Transfuse if hemoglobin drops less than 7. Iron  studies showed low iron , given IV iron  Suspected cecal AVM.  GI started octreotide .   He had an episode of hematochezia today but most likely is old blood.  Hemoglobin has remained better than yesterday.  No abdominal pain  CKD stage  IV/hyperkalemia: Baseline creatinine of 2.5-2.7.  Currently kidney function at baseline.  Hyperkalemia resolved.  Monitor kidney function  COPD/chronic hypoxic respiratory failure: On 4 to 6 L of oxygen  at home.  Currently on the same.  Continue home inhalers.  On prednisone  at home which will be continued.  Continue bronchodilators as needed  Type 2 diabetes: Admission A1c of 7.1.  Not on any medication at home.  Might need to start on oral medication on discharge.  Continue sliding scale.  Continue to monitor.  Became hypoglycemic last night, decreased the intensity of insulin  sliding scale  HFpEF: Patient appears euvolemic on exam.  Elevated BNP on admission.  Continue home Lasix .  Monitor daily input/output, daily weight  Hypertension: Blood pressure stable this morning  Coronary artery disease/hyperlipidemia: Status post CABG.  No chest pain.  No anginal symptoms.  Continue simvastatin   Mood disorder: Lexapro , mirtazapine   History of vascular dementia: On memantine .  Continue delirium precaution, frequent orientation.  He lives with daughter.         DVT prophylaxis:SCDs Start: 10/25/23 1936     Code Status: Full Code  Family Communication: Son at the bedside on 9/6, 9/7  Patient status:Inpatient  Patient is from :home  Anticipated discharge to: Home  Estimated DC date: Tomorrow   Consultants: GI  Procedures: None  Antimicrobials:  Anti-infectives (From admission, onward)    None       Subjective: Patient seen and examined at bedside today.  Comfortable.  Son noted 1 episode of hematochezia last night.  There was blood in the commode.  Likely old blood.  Hemoglobin has remained stable.  Denies abdomen pain, nausea or vomiting  Objective: Vitals:   10/27/23 0432 10/27/23 0433 10/27/23 0556 10/27/23 0845  BP: (!) 172/68 (!) 175/65 136/69   Pulse:   63   Resp:      Temp:      TempSrc:      SpO2:    98%  Weight:      Height:        Intake/Output  Summary (Last 24 hours) at 10/27/2023 1111 Last data filed at 10/27/2023 0830 Gross per 24 hour  Intake 990 ml  Output 225 ml  Net 765 ml   Filed Weights   10/26/23 0555  Weight: 69.9 kg    Examination:  General exam: Overall comfortable, not in distress HEENT: PERRL Respiratory system:  no wheezes or crackles , diminished sounds in bases Cardiovascular system: S1 & S2 heard, RRR.  Gastrointestinal system: Abdomen is nondistended, soft and nontender. Central nervous system: Alert and oriented Extremities: No edema, no clubbing ,no cyanosis Skin: No rashes, no ulcers,no icterus       Data Reviewed: I have personally reviewed following labs and imaging studies  CBC: Recent Labs  Lab 10/25/23 1707 10/26/23 0539 10/26/23 1621 10/27/23 0537  WBC 11.8* 10.1  --   --   HGB 8.6* 7.2* 7.7* 8.0*  HCT 29.3* 24.0* 25.7* 25.7*  MCV 101.7* 101.7*  --   --   PLT 182 137*  --   --    Basic Metabolic Panel: Recent Labs  Lab 10/25/23 1707 10/26/23 0539 10/27/23 0537  NA 140 139 139  K 5.4* 4.9 5.5*  CL 103 104 103  CO2 24 24 25   GLUCOSE 242* 158* 152*  BUN 54* 56* 50*  CREATININE 2.72* 2.72* 2.46*  CALCIUM 9.1 8.7* 8.8*  MG 2.3  --   --   PHOS  --  3.4  --      No results found for this or any previous visit (from the past 240 hours).   Radiology Studies: DG Chest 2 View Result Date: 10/25/2023 CLINICAL DATA:  Shortness of breath EXAM: CHEST - 2 VIEW COMPARISON:  08/27/2023 and CT scan from 08/28/2023 FINDINGS: Atherosclerotic calcification of the aortic arch. Band of scarring in the right upper lobe appears stable. Blunted right costophrenic angle compatible with right pleural effusion. Prior CABG. Emphysema. Thoracic spondylosis. Overall similar appearance to the 08/27/2023 exam. IMPRESSION: 1. Small right pleural effusion. 2. Stable band of scarring in the right upper lobe. 3. Emphysema. 4. Thoracic spondylosis. 5. Aortic Atherosclerosis (ICD10-I70.0). Electronically Signed   By:  Ryan Salvage M.D.   On: 10/25/2023 17:49    Scheduled Meds:  artificial tears  1 drop Both Eyes Q4H   cholecalciferol   2,000 Units Oral Daily   escitalopram   20 mg Oral Daily   [START ON 10/28/2023] ferrous sulfate   325 mg Oral Q breakfast   furosemide   40 mg Oral Daily   insulin  aspart  0-6 Units Subcutaneous TID WC   memantine   5 mg Oral BID   mirtazapine   7.5 mg Oral QHS   octreotide   50 mcg Subcutaneous Q12H   pantoprazole  (PROTONIX ) IV  40 mg Intravenous Q12H   predniSONE   15 mg Oral Q breakfast   simvastatin   40 mg Oral Daily   umeclidinium-vilanterol  1 puff Inhalation Daily   Continuous Infusions:   LOS: 2 days   Ivonne Mustache, MD [facility] P9/08/2023, 11:11 AM

## 2023-10-27 NOTE — Plan of Care (Signed)
  Problem: Pain Managment: Goal: General experience of comfort will improve and/or be controlled Outcome: Progressing   Problem: Safety: Goal: Ability to remain free from injury will improve Outcome: Progressing   Problem: Skin Integrity: Goal: Risk for impaired skin integrity will decrease Outcome: Progressing

## 2023-10-28 ENCOUNTER — Other Ambulatory Visit (HOSPITAL_COMMUNITY): Payer: Self-pay

## 2023-10-28 DIAGNOSIS — K5521 Angiodysplasia of colon with hemorrhage: Secondary | ICD-10-CM | POA: Diagnosis not present

## 2023-10-28 DIAGNOSIS — K922 Gastrointestinal hemorrhage, unspecified: Secondary | ICD-10-CM | POA: Diagnosis not present

## 2023-10-28 LAB — BASIC METABOLIC PANEL WITH GFR
Anion gap: 10 (ref 5–15)
BUN: 53 mg/dL — ABNORMAL HIGH (ref 8–23)
CO2: 24 mmol/L (ref 22–32)
Calcium: 8.5 mg/dL — ABNORMAL LOW (ref 8.9–10.3)
Chloride: 101 mmol/L (ref 98–111)
Creatinine, Ser: 2.45 mg/dL — ABNORMAL HIGH (ref 0.61–1.24)
GFR, Estimated: 25 mL/min — ABNORMAL LOW (ref >60)
Glucose, Bld: 217 mg/dL — ABNORMAL HIGH (ref 70–99)
Potassium: 5.6 mmol/L — ABNORMAL HIGH (ref 3.5–5.1)
Sodium: 135 mmol/L (ref 135–145)

## 2023-10-28 LAB — GLUCOSE, CAPILLARY
Glucose-Capillary: 203 mg/dL — ABNORMAL HIGH (ref 70–99)
Glucose-Capillary: 242 mg/dL — ABNORMAL HIGH (ref 70–99)
Glucose-Capillary: 396 mg/dL — ABNORMAL HIGH (ref 70–99)
Glucose-Capillary: 191 mg/dL — ABNORMAL HIGH (ref 70–99)

## 2023-10-28 LAB — PREPARE RBC (CROSSMATCH)

## 2023-10-28 LAB — HEMOGLOBIN AND HEMATOCRIT, BLOOD
HCT: 25 % — ABNORMAL LOW (ref 39.0–52.0)
HCT: 32.9 % — ABNORMAL LOW (ref 39.0–52.0)
Hemoglobin: 7.3 g/dL — ABNORMAL LOW (ref 13.0–17.0)
Hemoglobin: 10 g/dL — ABNORMAL LOW (ref 13.0–17.0)

## 2023-10-28 MED ORDER — SODIUM CHLORIDE 0.9% IV SOLUTION: Freq: Once | INTRAVENOUS | Status: AC

## 2023-10-28 MED ORDER — SODIUM ZIRCONIUM CYCLOSILICATE 10 G PO PACK
10.0000 g | PACK | Freq: Once | ORAL | Status: AC
  Administered 2023-10-28: 10 g via ORAL
  Filled 2023-10-28: qty 1

## 2023-10-28 MED ORDER — ORAL CARE MOUTH RINSE: 15.0000 mL | OROMUCOSAL | Status: DC | PRN

## 2023-10-28 NOTE — Progress Notes (Signed)
   10/28/23 1210  TOC Brief Assessment  Insurance and Status Reviewed  Patient has primary care physician Yes  Home environment has been reviewed home w/ adult children  Prior level of function: independent  Prior/Current Home Services No current home services  Social Drivers of Health Review SDOH reviewed no interventions necessary  Readmission risk has been reviewed Yes  Transition of care needs transition of care needs identified, TOC will continue to follow

## 2023-10-28 NOTE — Progress Notes (Addendum)
 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  Report of dark stool for the last few days.  Also reported dyspnea on exertion, fatigue, mild shortness of breath.  On presentation, he was hemodynamically stable.  On baseline oxygen  of 4 L, maintaining saturation.  Hemoglobin was 8.6, creatinine of 2.7, elevated proBNP of 1612.  EKG showed nonspecific changes.  Chest x-ray showed small right pleural effusion.  GI was consulted and patient admitted for the management of  GI bleed.  Plan to monitor 1 more night before discharge.  Being transfused with a unit of PRBC today  Assessment & Plan:  Principal Problem:   GI bleed Active Problems:   Chronic heart failure with preserved ejection fraction (HCC)   CKD (chronic kidney disease), stage IV (HCC)   COPD without exacerbation (HCC)   Chronic hypoxic respiratory failure (HCC)   Type 2 diabetes mellitus with hyperglycemia, without long-term current use of insulin  (HCC)   Acute blood loss anemia/GI bleed/history of AVMs: Baseline hemoglobin around 9.  Presented with hemoglobin of 8.6.  Report of black tarry stools at home.  History of AVMs.  GI consulted.  Currently on Protonix .  Continue iron  supplementation.  Hemoglobin in the range of 7 this morning.  Continue to monitor.  Will transfuse with a unit of PRBC. Iron  studies showed low iron , given IV iron  Suspected cecal AVM.  GI started octreotide .   He he passed brown stool mixed with blood yesterday evening.  No abdominal pain.  Currently on full liquid diet.  CKD stage  IV/hyperkalemia: Baseline creatinine of 2.5-2.7.  Currently kidney function at baseline.  Potassium of 5.6 today.  Given a dose of Lokelma ..  Monitor kidney function  COPD/chronic hypoxic respiratory failure: On 4 to 6 L of oxygen  at home.  Currently on the same.  Continue home inhalers.  On prednisone  at home which will be continued.  Continue bronchodilators as needed  Type 2 diabetes: Admission A1c of 7.1.  Not on any medication at home.  Might need to start on oral medication on discharge.  Continue sliding scale.  Continue to monitor.   HFpEF: Patient appears euvolemic on exam.  Elevated BNP on admission.  Continue home Lasix .  Monitor daily input/output, daily weight  Hypertension: Blood pressure stable this morning  Coronary artery disease/hyperlipidemia: Status post CABG.  No chest pain.  No anginal symptoms.  Continue simvastatin   Mood disorder: Lexapro , mirtazapine   History of vascular dementia: On memantine .  Continue delirium precaution, frequent orientation.  He lives with daughter.         DVT prophylaxis:SCDs Start: 10/25/23 1936     Code Status: Full Code  Family Communication: Daughter at bedside on 9/8  Patient status:Inpatient  Patient is from :home  Anticipated discharge to: Home  Estimated DC date: Tomorrow   Consultants: GI  Procedures: None  Antimicrobials:  Anti-infectives (From admission, onward)    None       Subjective: Patient seen and examined at bedside today.  Comfortable.  Lying in bed.  No new complaints.  No abdominal pain.  No gross hematochezia or melena.  Passage of brown stool mixed with blood yesterday evening.  Hemoglobin dropped to the range of 7 today.  We discussed about transfusing with a unit of PRBC  Objective: Vitals:   10/27/23 1344 10/27/23 2035 10/28/23 0555 10/28/23 0851  BP: (!) 144/60 (!) 151/58 (!) 128/55   Pulse: 72 66 62   Resp: 18 14 16    Temp: 98.1 F (36.7 C) 98.4 F (36.9 C) 98.6 F (37 C)    TempSrc: Oral Oral Oral   SpO2: 97% 93% 99% 98%  Weight:      Height:        Intake/Output Summary (Last 24 hours) at 10/28/2023 1117 Last data filed at 10/27/2023 2230 Gross per 24 hour  Intake 840 ml  Output 200 ml  Net 640 ml   Filed Weights   10/26/23 0555  Weight: 69.9 kg    Examination:   General exam: Overall comfortable, not in distress HEENT: PERRL Respiratory system:  no wheezes or crackles  Cardiovascular system: S1 & S2 heard, RRR.  Gastrointestinal system: Abdomen is nondistended, soft and nontender. Central nervous system: Alert and oriented Extremities: No edema, no clubbing ,no cyanosis Skin: No rashes, no ulcers,no icterus       Data Reviewed: I have personally reviewed following labs and imaging studies  CBC: Recent Labs  Lab 10/25/23 1707 10/26/23 0539 10/26/23 1621 10/27/23 0537 10/27/23 1615 10/28/23 0447  WBC 11.8* 10.1  --   --   --   --   HGB 8.6* 7.2* 7.7* 8.0* 8.0* 7.3*  HCT 29.3* 24.0* 25.7* 25.7* 27.3* 25.0*  MCV 101.7* 101.7*  --   --   --   --   PLT 182 137*  --   --   --   --    Basic Metabolic Panel: Recent Labs  Lab 10/25/23 1707 10/26/23 0539 10/27/23 0537 10/28/23 0447  NA 140 139 139 135  K 5.4* 4.9 5.5* 5.6*  CL 103 104 103 101  CO2 24 24 25 24   GLUCOSE 242* 158* 152* 217*  BUN 54* 56* 50* 53*  CREATININE 2.72* 2.72* 2.46* 2.45*  CALCIUM 9.1 8.7* 8.8* 8.5*  MG 2.3  --   --   --   PHOS  --  3.4  --   --      No results found for this or any previous visit (from the past 240 hours).   Radiology Studies: No results found.   Scheduled Meds:  sodium chloride    Intravenous Once   artificial tears  1 drop Both Eyes Q4H   cholecalciferol   2,000 Units Oral Daily   escitalopram   20 mg Oral Daily   ferrous sulfate   325 mg Oral Q breakfast   furosemide   40 mg Oral Daily   insulin  aspart  0-6 Units Subcutaneous TID WC   memantine   5 mg Oral BID   mirtazapine   7.5 mg Oral QHS   octreotide   50 mcg Subcutaneous  Q12H   pantoprazole  (PROTONIX ) IV  40 mg Intravenous Q12H   predniSONE   15 mg Oral Q breakfast   simvastatin   40 mg Oral Daily   umeclidinium-vilanterol  1 puff Inhalation Daily   Continuous Infusions:   LOS: 3 days   Ivonne Mustache, MD [facility] P9/09/2023, 11:17 AM

## 2023-10-28 NOTE — Plan of Care (Signed)
 Pt is now in SCDs and reports no pain.

## 2023-10-28 NOTE — Progress Notes (Addendum)
 Progress Note   Subjective  Chief Complaint: GI bleed  Today, patient found with his daughter by his bedside.  She reports that he had another bowel movement which was solid but still with a small amount of bright red blood streaking the outside yesterday around 9 PM.  Nothing this morning.  He is tolerating his full liquid diet, fully enjoying his grits this morning.  No new abdominal pain or other symptoms.  Daughter says that they already discussed with the hospitalist who was concerned about drop in hemoglobin with plans for patient to stay overnight again after a unit of blood.  Hemoglobin 8.0--> 7.3 overnight   Objective   Vital signs in last 24 hours: Temp:  [98.1 F (36.7 C)-98.6 F (37 C)] 98.6 F (37 C) (09/08 0555) Pulse Rate:  [62-72] 62 (09/08 0555) Resp:  [14-18] 16 (09/08 0555) BP: (128-151)/(55-60) 128/55 (09/08 0555) SpO2:  [93 %-99 %] 98 % (09/08 0851) Last BM Date : 10/27/23 General: AA male in NAD Heart:  Regular rate and rhythm; no murmurs Lungs: Respirations even and unlabored, lungs CTA bilaterally Abdomen:  Soft, nontender and nondistended. Normal bowel sounds. Neurologic:  Alert and oriented,  grossly normal neurologically. Psych:  Cooperative. Normal mood and affect.  Intake/Output from previous day: 09/07 0701 - 09/08 0700 In: 960 [P.O.:960] Out: 200 [Urine:200]   Lab Results: Recent Labs    10/25/23 1707 10/26/23 0539 10/26/23 1621 10/27/23 0537 10/27/23 1615 10/28/23 0447  WBC 11.8* 10.1  --   --   --   --   HGB 8.6* 7.2*   < > 8.0* 8.0* 7.3*  HCT 29.3* 24.0*   < > 25.7* 27.3* 25.0*  PLT 182 137*  --   --   --   --    < > = values in this interval not displayed.   BMET Recent Labs    10/26/23 0539 10/27/23 0537 10/28/23 0447  NA 139 139 135  K 4.9 5.5* 5.6*  CL 104 103 101  CO2 24 25 24   GLUCOSE 158* 152* 217*  BUN 56* 50* 53*  CREATININE 2.72* 2.46* 2.45*  CALCIUM 8.7* 8.8* 8.5*   LFT Recent Labs    10/25/23 1707  10/26/23 0539  PROT 6.1*  --   ALBUMIN 3.9 3.4*  AST 14*  --   ALT 15  --   ALKPHOS 65  --   BILITOT 0.2  --     Assessment / Plan:   89 year old male with a history of iron  deficiency anemia, nonbleeding cecal/right colonic angioectasias, history of adenomatous colon polyps, dementia, hypertension, hyperlipidemia, COPD, lung cancer status post radiation, CAD status post CABG who presented with hematochezia and acute on chronic blood loss anemia.  Assessment: 1.  Recurrent lower GI bleeding/known right colonic angioectasias/iron  deficiency anemia: Again this is presumed recurrent bleeding from right sided colonic angioectasias, cannot fully exclude diverticular hemorrhage but given history and course angioectasias are favored, hemoglobin did have a slight drop overnight from 8.0--> 7.3, patient has continued with a very small amount per his daughter of bright red blood with stool, the last around 9 PM.  Labs from 9/6 did show iron  deficiency so patient was started on oral iron  supplement.  Again family and patient are hesitant to pursue repeat colonoscopy due to concerns about sedation side effects. 2.  Heart failure preserved ejection fraction: Euvolemic, continue on home Lasix  3.  COPD with chronic hypoxic respiratory failure: On 4 L of Oxygen , continuing on home  Ellipta, currently on a Prednisone  taper, daily PPI while on Prednisone  4.  Vascular dementia 5.  CKD stage IV, mild hyperkalemia  Plan: 1.  Again we will continue full liquid diet for now, can advance tomorrow if hemoglobin remained stable overnight 2.  Continue to trend hemoglobin every 8 hours less than 7 3.  Continue oral iron  for iron  deficiency anemia 4.  Continue subcu Octreotide  every 12 hours.  Plan is to try and get Depo long-acting formulation approved by his insurance for outpatient use. 5.  Recommend holding Aspirin  indefinitely given recurrent GI bleeding 6.  Continue daily PPI while on Prednisone  7.  Agree with  continued observation overnight, hopefully hemoglobin remained stable after unit of PRBCs and bleeding continues to stop.  If not may need to reconsider colonoscopy.  Thank you for your kind consultation, we will continue to follow along.   LOS: 3 days   Brian [name]  10/28/2023, 9:41 AM     [facility] Attending   I have taken an interval history, reviewed the chart and have seen the patient.  I have discussed the plan with the patient and his daughter.  I agree with the Advanced Practitioner's note, impression and recommendations with the following additions:  Bleeding appears to be slowing though hemoglobin lower so we will be getting blood.  Continue octreotide .  We will ask for help from pharmacy regarding outpatient long-acting octreotide  20 mg monthly IM.  If possible to give parenteral iron  would do so.  We will follow-up tomorrow.  Lupita CHARLENA Commander, MD, [facility] See TRACEY on call - gastroenterology for best contact person 10/28/2023 2:25 PM

## 2023-10-29 ENCOUNTER — Encounter: Payer: Self-pay | Admitting: Internal Medicine

## 2023-10-29 DIAGNOSIS — R710 Precipitous drop in hematocrit: Secondary | ICD-10-CM

## 2023-10-29 DIAGNOSIS — K922 Gastrointestinal hemorrhage, unspecified: Secondary | ICD-10-CM | POA: Diagnosis not present

## 2023-10-29 DIAGNOSIS — K5521 Angiodysplasia of colon with hemorrhage: Secondary | ICD-10-CM | POA: Diagnosis not present

## 2023-10-29 LAB — BPAM RBC
Blood Product Expiration Date: 202510042359
ISSUE DATE / TIME: 202509081142
Unit Type and Rh: 7300

## 2023-10-29 LAB — CBC
HCT: 30.6 % — ABNORMAL LOW (ref 39.0–52.0)
Hemoglobin: 9.6 g/dL — ABNORMAL LOW (ref 13.0–17.0)
MCH: 30.5 pg (ref 26.0–34.0)
MCHC: 31.4 g/dL (ref 30.0–36.0)
MCV: 97.1 fL (ref 80.0–100.0)
Platelets: 142 K/uL — ABNORMAL LOW (ref 150–400)
RBC: 3.15 MIL/uL — ABNORMAL LOW (ref 4.22–5.81)
RDW: 17.2 % — ABNORMAL HIGH (ref 11.5–15.5)
WBC: 8.2 K/uL (ref 4.0–10.5)
nRBC: 0.5 % — ABNORMAL HIGH (ref 0.0–0.2)

## 2023-10-29 LAB — GLUCOSE, CAPILLARY
Glucose-Capillary: 112 mg/dL — ABNORMAL HIGH (ref 70–99)
Glucose-Capillary: 198 mg/dL — ABNORMAL HIGH (ref 70–99)
Glucose-Capillary: 198 mg/dL — ABNORMAL HIGH (ref 70–99)
Glucose-Capillary: 200 mg/dL — ABNORMAL HIGH (ref 70–99)
Glucose-Capillary: 349 mg/dL — ABNORMAL HIGH (ref 70–99)
Glucose-Capillary: 411 mg/dL — ABNORMAL HIGH (ref 70–99)
Glucose-Capillary: 417 mg/dL — ABNORMAL HIGH (ref 70–99)
Glucose-Capillary: 62 mg/dL — ABNORMAL LOW (ref 70–99)

## 2023-10-29 LAB — BASIC METABOLIC PANEL WITH GFR
Anion gap: 11 (ref 5–15)
BUN: 57 mg/dL — ABNORMAL HIGH (ref 8–23)
CO2: 24 mmol/L (ref 22–32)
Calcium: 8.5 mg/dL — ABNORMAL LOW (ref 8.9–10.3)
Chloride: 100 mmol/L (ref 98–111)
Creatinine, Ser: 2.32 mg/dL — ABNORMAL HIGH (ref 0.61–1.24)
GFR, Estimated: 26 mL/min — ABNORMAL LOW (ref >60)
Glucose, Bld: 225 mg/dL — ABNORMAL HIGH (ref 70–99)
Potassium: 5.4 mmol/L — ABNORMAL HIGH (ref 3.5–5.1)
Sodium: 135 mmol/L (ref 135–145)

## 2023-10-29 LAB — TYPE AND SCREEN
ABO/RH(D): B POS
Antibody Screen: NEGATIVE
Unit division: 0

## 2023-10-29 LAB — HEMOGLOBIN A1C
Hgb A1c MFr Bld: 8.2 % — ABNORMAL HIGH (ref 4.8–5.6)
Mean Plasma Glucose: 188.64 mg/dL

## 2023-10-29 MED ORDER — PANTOPRAZOLE SODIUM 40 MG PO TBEC
40.0000 mg | DELAYED_RELEASE_TABLET | Freq: Every day | ORAL | Status: DC
  Administered 2023-10-30: 40 mg via ORAL
  Filled 2023-10-29: qty 1

## 2023-10-29 MED ORDER — INSULIN ASPART 100 UNIT/ML IJ SOLN
15.0000 [IU] | Freq: Once | INTRAMUSCULAR | Status: AC
  Administered 2023-10-29: 15 [IU] via SUBCUTANEOUS

## 2023-10-29 MED ORDER — PREDNISONE 5 MG PO TABS
10.0000 mg | ORAL_TABLET | Freq: Every day | ORAL | Status: DC
Start: 2023-10-30 — End: 2023-10-29

## 2023-10-29 MED ORDER — INSULIN ASPART 100 UNIT/ML IJ SOLN
0.0000 [IU] | Freq: Three times a day (TID) | INTRAMUSCULAR | Status: DC
  Administered 2023-10-29: 7 [IU] via SUBCUTANEOUS
  Administered 2023-10-30: 3 [IU] via SUBCUTANEOUS

## 2023-10-29 MED ORDER — SODIUM ZIRCONIUM CYCLOSILICATE 10 G PO PACK
10.0000 g | PACK | Freq: Once | ORAL | Status: AC
  Administered 2023-10-29: 10 g via ORAL
  Filled 2023-10-29: qty 1

## 2023-10-29 MED ORDER — INSULIN GLARGINE 100 UNIT/ML ~~LOC~~ SOLN
5.0000 [IU] | Freq: Every day | SUBCUTANEOUS | Status: DC
  Administered 2023-10-29: 5 [IU] via SUBCUTANEOUS
  Filled 2023-10-29 (×2): qty 0.05

## 2023-10-29 NOTE — Progress Notes (Signed)
 PROGRESS NOTE  Brian [name]  FMW:[id number] DOB: Jan 04, 1932 DOA: 10/25/2023 PCP: Geofm Glade PARAS, MD   Brief Narrative: Patient is a 89 year old male with history of coronary artery disease status post CABG, COPD, chronic hypoxic respiratory failure on 4 to 6 L of oxygen  per minute, CKD stage IV, diabetes type 2, hypertension, hyperlipidemia, Alzheimer's/vascular dementia, aortic aneurysm, history of GI bleed from cecal AVMs who presented for the evaluation of bloody stools.  Report of dark stool for the last few days.  Also reported dyspnea on exertion, fatigue, mild shortness of breath.  On presentation, he was hemodynamically stable.  On baseline oxygen  of 4 L, maintaining saturation.  Hemoglobin was 8.6, creatinine of 2.7, elevated proBNP of 1612.  EKG showed nonspecific changes.  Chest x-ray showed small right pleural effusion.  GI was consulted and patient admitted for the management of  GI bleed.  Given unit of blood transfused on 9/8.  Hemoglobin stable in the range of 9 today.  Plan for monitoring 1 more night, advancement of diet  Assessment & Plan:  Principal Problem:   GI bleed Active Problems:   Chronic heart failure with preserved ejection fraction (HCC)   CKD (chronic kidney disease), stage IV (HCC)   COPD without exacerbation (HCC)   Chronic hypoxic respiratory failure (HCC)   Type 2 diabetes mellitus with hyperglycemia, without long-term current use of insulin  (HCC)   Angiodysplasia of colon with hemorrhage   Acute blood loss anemia/GI bleed/history of AVMs: Baseline hemoglobin around 9.  Presented with hemoglobin of 8.6.  Report of black tarry stools at home.  History of AVMs.  GI consulted.  Currently on Protonix .  Continue iron  supplementation.  Hemoglobin dropped to the range of 7 on 9/8 and was given a unit of blood transfusion.  Hemoglobin is stable in the range of 9 today.  No new episodes of hematochezia . iron  studies showed low iron , given IV iron  Suspected cecal  AVM.  On  octreotide .   Diet advanced to solid today.  CKD stage IV/hyperkalemia: Baseline creatinine of 2.5-2.7.  Currently kidney function at baseline.  Potassium of 5.4 today.  Given another dose of Lokelma ..  Monitor kidney function.  He should follow-up with nephrology as an outpatient  COPD/chronic hypoxic respiratory failure: On 4 to 6 L of oxygen  at home.  Currently on the same.  Continue home inhalers.  On prednisone  15 mg at home at home , we will decrease the dose to 10 mg daily because of his persistent high blood sugar.  Continue bronchodilators as needed  Type 2 diabetes: recent  A1c of 7.1 as per July, his sugars are running high.  Will check new A1c level.  Likely from prednisone ..  Not on any antidiabetic medication at home.  Might need to start on oral medication on discharge on insulin  depending upon your A1c  . We will consult diabetic coordinator.  Will continue sliding scale and low-dose Lantus  for now.  HFpEF: Patient appears euvolemic on exam.  Elevated BNP on admission.  Continue home Lasix .  Monitor daily input/output, daily weight  Hypertension: Blood pressure slightly high this morning.  Looks like he does not take any medication at home but can consider low-dose amlodipine if blood pressure remains consistently high  Coronary artery disease/hyperlipidemia: Status post CABG.  No chest pain.  No anginal symptoms.  Continue simvastatin   Mood disorder: Lexapro , mirtazapine   History of vascular dementia: On memantine .  Continue delirium precaution, frequent orientation.  He lives with daughter.  DVT prophylaxis:SCDs Start: 10/25/23 1936     Code Status: Full Code  Family Communication: Son at bedside on 9/9  Patient status:Inpatient  Patient is from :home  Anticipated discharge to: Home  Estimated DC date: Tomorrow   Consultants: GI  Procedures: None  Antimicrobials:  Anti-infectives (From admission, onward)    None        Subjective: Patient seen and examined at bedside today.  Hemodynamically stable.  Lying comfortably in bed.  No episode of hematochezia.  Hemoglobin stable in the range of 10.  Blood sugars running high.  No new complaints.  Objective: Vitals:   10/29/23 0245 10/29/23 0255 10/29/23 0709 10/29/23 0821  BP:   (!) 151/68   Pulse:   63   Resp: (!) 22 20 16    Temp:   98.4 F (36.9 C)   TempSrc:      SpO2: (!) 86% 95% 100% 100%  Weight:      Height:        Intake/Output Summary (Last 24 hours) at 10/29/2023 1153 Last data filed at 10/29/2023 0540 Gross per 24 hour  Intake 1460 ml  Output 975 ml  Net 485 ml   Filed Weights   10/26/23 0555  Weight: 69.9 kg    Examination:  General exam: Overall comfortable, not in distress, pleasant elderly male HEENT: PERRL Respiratory system:  no wheezes or crackles  Cardiovascular system: S1 & S2 heard, RRR.  Gastrointestinal system: Abdomen is nondistended, soft and nontender. Central nervous system: Alert and oriented Extremities: No edema, no clubbing ,no cyanosis Skin: No rashes, no ulcers,no icterus      Data Reviewed: I have personally reviewed following labs and imaging studies  CBC: Recent Labs  Lab 10/25/23 1707 10/26/23 0539 10/26/23 1621 10/27/23 0537 10/27/23 1615 10/28/23 0447 10/28/23 1837 10/29/23 0444  WBC 11.8* 10.1  --   --   --   --   --  8.2  HGB 8.6* 7.2*   < > 8.0* 8.0* 7.3* 10.0* 9.6*  HCT 29.3* 24.0*   < > 25.7* 27.3* 25.0* 32.9* 30.6*  MCV 101.7* 101.7*  --   --   --   --   --  97.1  PLT 182 137*  --   --   --   --   --  142*   < > = values in this interval not displayed.   Basic Metabolic Panel: Recent Labs  Lab 10/25/23 1707 10/26/23 0539 10/27/23 0537 10/28/23 0447 10/29/23 0444  NA 140 139 139 135 135  K 5.4* 4.9 5.5* 5.6* 5.4*  CL 103 104 103 101 100  CO2 24 24 25 24 24   GLUCOSE 242* 158* 152* 217* 225*  BUN 54* 56* 50* 53* 57*  CREATININE 2.72* 2.72* 2.46* 2.45* 2.32*  CALCIUM 9.1  8.7* 8.8* 8.5* 8.5*  MG 2.3  --   --   --   --   PHOS  --  3.4  --   --   --      No results found for this or any previous visit (from the past 240 hours).   Radiology Studies: No results found.   Scheduled Meds:  artificial tears  1 drop Both Eyes Q4H   cholecalciferol   2,000 Units Oral Daily   escitalopram   20 mg Oral Daily   ferrous sulfate   325 mg Oral Q breakfast   furosemide   40 mg Oral Daily   insulin  aspart  0-6 Units Subcutaneous TID WC   memantine   5 mg Oral BID   mirtazapine   7.5 mg Oral QHS   octreotide   50 mcg Subcutaneous Q12H   pantoprazole  (PROTONIX ) IV  40 mg Intravenous Q12H   predniSONE   15 mg Oral Q breakfast   simvastatin   40 mg Oral Daily   umeclidinium-vilanterol  1 puff Inhalation Daily   Continuous Infusions:   LOS: 4 days   Ivonne Mustache, MD [facility] P9/10/2023, 11:53 AM

## 2023-10-29 NOTE — Progress Notes (Addendum)
 Progress Note   Subjective  Chief Complaint: GI bleed  Today, patient found with his son by his bedside.  He reports the last bowel movement he had was yesterday with only a scant amount of bright red blood.  He asked about results from hemoglobin testing and plans going forward.  Asked about Octreotide .  Wonders when his dad will get to go home.  Hemoglobin 8.0--> 7.3--> 1 unit PRBCs--> 10.0--> 9.6    Objective   Vital signs in last 24 hours: Temp:  [97.8 F (36.6 C)-98.4 F (36.9 C)] 98.4 F (36.9 C) (09/09 0709) Pulse Rate:  [61-69] 63 (09/09 0709) Resp:  [16-24] 16 (09/09 0709) BP: (141-159)/(58-68) 151/68 (09/09 0709) SpO2:  [86 %-100 %] 100 % (09/09 0821) Last BM Date : 10/28/23 General:   AA male in NAD Heart:  Regular rate and rhythm; no murmurs Lungs: Respirations even and unlabored, lungs CTA bilaterally Abdomen:  Soft, nontender and nondistended. Normal bowel sounds. Neurologic:  Alert and oriented,  grossly normal neurologically. Psych:  Cooperative. Normal mood and affect.  Intake/Output from previous day: 09/08 0701 - 09/09 0700 In: 1700 [P.O.:1300; Blood:400] Out: 975 [Urine:975]  Lab Results: Recent Labs    10/28/23 0447 10/28/23 1837 10/29/23 0444  WBC  --   --  8.2  HGB 7.3* 10.0* 9.6*  HCT 25.0* 32.9* 30.6*  PLT  --   --  142*   BMET Recent Labs    10/27/23 0537 10/28/23 0447 10/29/23 0444  NA 139 135 135  K 5.5* 5.6* 5.4*  CL 103 101 100  CO2 25 24 24   GLUCOSE 152* 217* 225*  BUN 50* 53* 57*  CREATININE 2.46* 2.45* 2.32*  CALCIUM 8.8* 8.5* 8.5*    Assessment / Plan:   89 year old male with a history of iron  deficiency anemia, nonbleeding cecal/right colonic angioectasias, history of adenomatous colon polyps, dementia, hypertension, hyperlipidemia, COPD, lung cancer status postradiation, CAD status post CABG who presented with hematochezia and acute on chronic blood loss anemia.  Assessment: 1.  Recurrent lower GI bleeding/normal  right colonic angiectasia/iron  deficiency anemia: This is presumed recurrent bleeding from right sided colonic angiectasia's, cannot fully exclude diverticular hemorrhage but given history and course angiectasias are favored, hemoglobin had a slight drop overnight from 9/7 to 9/8 from 8 down to 7.3, patient received unit of PRBCs yesterday with an increase in hemoglobin to 10 down to 9.6 this morning, only a very small amount of bright red blood on the solid stool yesterday 1 time, nothing at this morning, patient started on an oral iron  supplement here 2.  Heart failure preserved ejection fraction: Euvolemic, continue on home Lasix  3.  COPD with chronic hypoxic respiratory failure: On 4 L of oxygen , continue on home Ellipta, currently on a Prednisone  taper, daily PPI while on prednisone  4.  Vascular dementia 5.  CKD stage IV with mild hyperkalemia  Plan: 1.  Will advance diet to regular today and see how he does 2.  Continue to trend hemoglobin with transfusion less than 7 3.  Continue oral iron  for iron  deficiency.  Dr. Avram had recommended parenteral iron  and per hospitalist notes they were going to order this, I do not see that that was done. 4.  Will continue subcu Octreotide  every 12 hours while here.  Discussed getting the patient set up for long-acting formulation outpatient, does not look like [employer] would cover this for him per their discussion.  Explained this to the son who is going to check  with the [facility]. 5.  Recommend holding Aspirin  indefinitely given recurrent GI bleeding 6.  Continue daily PPI while on Prednisone  7.  Likely if the patient does well on a regular diet and remained stable overnight he could be discharged tomorrow.  Thank you for your kind consultation, we will continue to follow along.    LOS: 4 days   Brian [name]  10/29/2023, 9:35 AM     [facility] Attending   I have taken an interval history, reviewed the chart and examined the patient. I agree with  the Advanced Practitioner's note, impression and recommendations with the following additions:   Difficult situation.  Elderly, recurrent GI bleeding which seems to be resolving at this time.  I think medical supportive care is best option though he could bleed again in the future.  Hopefully will be able to go home tomorrow.  Family to pursue possible outpatient octreotide  through the [facility].  Brian CHARLENA Commander, MD, [facility] See TRACEY on call - gastroenterology for best contact person 10/29/2023 5:11 PM

## 2023-10-29 NOTE — Inpatient Diabetes Management (Incomplete)
 Inpatient Diabetes Program Recommendations  AACE/ADA: New Consensus Statement on Inpatient Glycemic Control (2015)  Target Ranges:  Prepandial:   less than 140 mg/dL      Peak postprandial:   less than 180 mg/dL (1-2 hours)      Critically ill patients:  140 - 180 mg/dL   Lab Results  Component Value Date   GLUCAP 417 (H) 10/29/2023   HGBA1C 7.1 (H) 08/28/2023    Review of Glycemic Control  Diabetes history: DM2 Outpatient Diabetes medications: Prednisone  15 mg with breakfast Current orders for Inpatient glycemic control: Lantus  5 daily, Novolog  0-9 TID  HgbA1C 7.1% Steroid-induced hyperglycemia with DM2  Inpatient Diabetes Program Recommendations:    HgbA1C doesn't accurately reflect measure of glycemic control with anemia. Blood sugars have been very labile, from 40s to > 400 mg/dL. Inconsistent po intake, would not have tight glycemic control, want to prevent hypos.  Would think Novolog  0-9 units TID would be the best option for home. Would not order basal insulin  for home. Spoke with daughter and son yesterday afternoon and family is very supportive.  Will teach daughter and son insulin  pen use if MD agrees. There are several things that will help in keeping blood sugars in better control at home, such as eliminating juices, beverages with sugar, candy and sweets. Gave better options such as diet soda, Crystal Light and Gatorade Zero. Increase veggies and high-fiber breads, cereals, pasta. Continue checking blood sugars several times/day. Again reviewed hypoglycemia s/s and treatment. To f/u with PCP after discharge.  Will see this morning for any further questions. Can teach insulin  pen administration if MD agrees.   Thank you. Shona Brandy, RD, LDN, CDCES Inpatient Diabetes Coordinator [phone]  Discussed SGLT-2's with pharmacist. May be good option instead of rapid-acting insulin . Farxiga  or Jardiance . RV

## 2023-10-30 ENCOUNTER — Other Ambulatory Visit (HOSPITAL_COMMUNITY): Payer: Self-pay

## 2023-10-30 ENCOUNTER — Encounter: Payer: Self-pay | Admitting: Internal Medicine

## 2023-10-30 DIAGNOSIS — K922 Gastrointestinal hemorrhage, unspecified: Secondary | ICD-10-CM | POA: Diagnosis not present

## 2023-10-30 DIAGNOSIS — R710 Precipitous drop in hematocrit: Secondary | ICD-10-CM | POA: Diagnosis not present

## 2023-10-30 DIAGNOSIS — K5521 Angiodysplasia of colon with hemorrhage: Secondary | ICD-10-CM | POA: Diagnosis not present

## 2023-10-30 LAB — BASIC METABOLIC PANEL WITH GFR
Anion gap: 10 (ref 5–15)
BUN: 67 mg/dL — ABNORMAL HIGH (ref 8–23)
CO2: 25 mmol/L (ref 22–32)
Calcium: 8.3 mg/dL — ABNORMAL LOW (ref 8.9–10.3)
Chloride: 100 mmol/L (ref 98–111)
Creatinine, Ser: 2.41 mg/dL — ABNORMAL HIGH (ref 0.61–1.24)
GFR, Estimated: 25 mL/min — ABNORMAL LOW (ref >60)
Glucose, Bld: 132 mg/dL — ABNORMAL HIGH (ref 70–99)
Potassium: 4.8 mmol/L (ref 3.5–5.1)
Sodium: 135 mmol/L (ref 135–145)

## 2023-10-30 LAB — GLUCOSE, CAPILLARY
Glucose-Capillary: 117 mg/dL — ABNORMAL HIGH (ref 70–99)
Glucose-Capillary: 121 mg/dL — ABNORMAL HIGH (ref 70–99)
Glucose-Capillary: 217 mg/dL — ABNORMAL HIGH (ref 70–99)

## 2023-10-30 LAB — CBC
HCT: 29.8 % — ABNORMAL LOW (ref 39.0–52.0)
Hemoglobin: 9 g/dL — ABNORMAL LOW (ref 13.0–17.0)
MCH: 29.5 pg (ref 26.0–34.0)
MCHC: 30.2 g/dL (ref 30.0–36.0)
MCV: 97.7 fL (ref 80.0–100.0)
Platelets: 133 K/uL — ABNORMAL LOW (ref 150–400)
RBC: 3.05 MIL/uL — ABNORMAL LOW (ref 4.22–5.81)
RDW: 16.3 % — ABNORMAL HIGH (ref 11.5–15.5)
WBC: 7.3 K/uL (ref 4.0–10.5)
nRBC: 0.3 % — ABNORMAL HIGH (ref 0.0–0.2)

## 2023-10-30 LAB — HEMOGLOBIN AND HEMATOCRIT, BLOOD
HCT: 31.8 % — ABNORMAL LOW (ref 39.0–52.0)
Hemoglobin: 9.6 g/dL — ABNORMAL LOW (ref 13.0–17.0)

## 2023-10-30 MED ORDER — PANTOPRAZOLE SODIUM 40 MG PO TBEC
40.0000 mg | DELAYED_RELEASE_TABLET | Freq: Every day | ORAL | 0 refills | Status: DC
  Filled 2023-10-30: qty 30, 30d supply, fill #0

## 2023-10-30 MED ORDER — PREDNISONE 10 MG PO TABS
10.0000 mg | ORAL_TABLET | Freq: Every day | ORAL | Status: DC
Start: 2023-10-30 — End: 2023-12-23

## 2023-10-30 MED ORDER — OCTREOTIDE ACETATE 20 MG IM KIT: 20.0000 mg | PACK | INTRAMUSCULAR | 0 refills | Status: AC

## 2023-10-30 MED ORDER — OCTREOTIDE ACETATE 20 MG IM KIT: 20.0000 mg | PACK | INTRAMUSCULAR | 0 refills | Status: DC

## 2023-10-30 MED ORDER — EMPAGLIFLOZIN 10 MG PO TABS
10.0000 mg | ORAL_TABLET | Freq: Every day | ORAL | 0 refills | Status: DC
  Filled 2023-10-30: qty 30, 30d supply, fill #0

## 2023-10-30 MED ORDER — DAPAGLIFLOZIN PROPANEDIOL 10 MG PO TABS
10.0000 mg | ORAL_TABLET | Freq: Every day | ORAL | 0 refills | Status: DC
  Filled 2023-10-30: qty 30, 30d supply, fill #0

## 2023-10-30 MED ORDER — OCTREOTIDE ACETATE 20 MG IM KIT: 20.0000 mg | PACK | INTRAMUSCULAR | Status: DC

## 2023-10-30 NOTE — Progress Notes (Addendum)
 Progress Note   Subjective  Chief Complaint: GI bleed  Today, patient is found with his daughter by his bedside.  His son and his son's wife are on the phone.  They all ask many questions in regards to his care.  They are ready for him to go home.  They report he actually had his first solid brown stool yesterday with no signs of bleeding.  Their plan is to bring a written prescription for the long-acting Octreotide  to the [facility] to try and get it filled.  Hemoglobin 8.0--> 7.3--> 1 unit PRBCs--> 10.0--> 9.6--> 9.0    Objective   Vital signs in last 24 hours: Temp:  [97.6 F (36.4 C)-98.4 F (36.9 C)] 97.6 F (36.4 C) (09/10 0412) Pulse Rate:  [64-71] 64 (09/10 0412) Resp:  [16-22] 16 (09/10 0543) BP: (130-153)/(58-59) 135/59 (09/10 0412) SpO2:  [98 %-100 %] 100 % (09/10 0921) Last BM Date : 10/29/23 General:AA male in NAD Heart:  Regular rate and rhythm; no murmurs Lungs: Respirations even and unlabored, lungs CTA bilaterally Abdomen:  Soft, nontender and nondistended. Normal bowel sounds. Psych:  Cooperative. Normal mood and affect.  Intake/Output from previous day: 09/09 0701 - 09/10 0700 In: 360 [P.O.:360] Out: 600 [Urine:600]  Lab Results: Recent Labs    10/28/23 1837 10/29/23 0444 10/30/23 0508  WBC  --  8.2 7.3  HGB 10.0* 9.6* 9.0*  HCT 32.9* 30.6* 29.8*  PLT  --  142* 133*   BMET Recent Labs    10/28/23 0447 10/29/23 0444 10/30/23 0508  NA 135 135 135  K 5.6* 5.4* 4.8  CL 101 100 100  CO2 24 24 25   GLUCOSE 217* 225* 132*  BUN 53* 57* 67*  CREATININE 2.45* 2.32* 2.41*  CALCIUM 8.5* 8.5* 8.3*     Assessment / Plan:   89 year old African-American male with a history of iron  deficiency anemia, nonbleeding cecal/right colonic angiectasia's, history of adenomatous colon polyps, dementia, hypertension, hyperlipidemia, COPD, lung cancer status postradiation, CAD status post CABG, who presented to the hospital with hematochezia and acute on chronic blood  loss anemia.  Assessment: 1.  Recurrent lower GI bleeding/normal right colonic angiectasia/iron  deficiency anemia: This is presumed recurrent bleeding from right sided colonic angiectasias, cannot fully exclude diverticular hemorrhage though history and course would favor angioectasias, hemoglobin has remained stable after 1 unit of PRBCs yesterday, currently at 9.0, no further bleeding since 10/28/2023, patient has received one dose of IV iron  here and was started on oral iron  supplement as well as Octreotide  2.  Heart failure preserved ejection fraction: Euvolemic 3.  COPD with chronic hypoxic respiratory failure: On 4 L of oxygen  and on a Prednisone  taper, daily PPI while on Prednisone  4.  Vascular dementia 5.  CKD stage IV with mild hyperkalemia  Plan: 1.  Okay for patient discharge home today from a GI standpoint.  Had a long discussion with the patient, his daughter and his son today.  They are in agreement with the plan.  Answered all of their questions. 2.  Continue oral iron  for iron  deficiency at discharge. 3.  Will send patient with a prescription for long-acting Octreotide  20 mg IM monthly.  They are going to take this to the [facility] to see if they can get it filled.  Previously had discussed with his insurance and they would not cover it for him per pharmacy team. 4.  Again recommend holding Aspirin  indefinitely given recurrent GI bleed 5.  Continue daily PPI while on Prednisone , then  can be discontinued  Thank you for your kind consultation.  We will sign off.   LOS: 5 days   Delon Hendricks Failing  10/30/2023, 10:58 AM    [facility] Attending   I have taken an interval history, reviewed the chart and  saw the patient. I agree with the Advanced Practitioner's note, impression and recommendations.  Lupita CHARLENA Commander, MD, [facility] See TRACEY on call - gastroenterology for best contact person 10/30/2023 2:00 PM

## 2023-10-30 NOTE — Discharge Summary (Addendum)
 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  Report of dark stool for the last few days.  Also reported dyspnea on exertion, fatigue, mild shortness of breath.  On presentation, he was hemodynamically stable.  On baseline oxygen  of 4 L, maintaining saturation.  Hemoglobin was 8.6, creatinine of 2.7, elevated proBNP of 1612.  EKG showed nonspecific changes.  Chest x-ray showed small right pleural effusion.  GI was consulted and patient admitted for the management of  GI bleed.  Given unit of blood transfused on 9/8.  Hemoglobin stable in the range of 9 today.  Tolerating solid diet.  Medically stable for discharge home today.  Following problems were addressed during the hospitalization:  Acute blood loss anemia/GI bleed/history of AVMs: Baseline hemoglobin around 9.  Presented with hemoglobin of 8.6.  Report of black tarry stools at home.  History of AVMs.  GI consulted.  Currently on Protonix .  Continue iron  supplementation.  Hemoglobin dropped to the range of 7 on 9/8 and was given a unit of blood transfusion.  Hemoglobin is stable in the range of 9 today.  No new episodes of hematochezia . iron  studies showed low iron , given IV iron  Suspected cecal AVM. He was octreotide  here.  GI recommending to continue IM octreotide  as an outpatient once a month Diet advanced to solid today.    CKD stage IV/hyperkalemia: Baseline creatinine of 2.5-2.7.  Currently kidney function at baseline.   He should follow-up with nephrology as an outpatient   COPD/chronic hypoxic respiratory failure: On 4 to 6 L of oxygen  at home.  Currently on the same.  Continue home inhalers.  On prednisone  15 mg at home at home , we will decrease the dose to 10 mg daily because of his persistent high blood sugar.     Type 2 diabetes: recent  A1c of 7.1 as per July, his sugars are running high.  New A1c of 8 .likely from prednisone ..  Not on any antidiabetic medication at home.  Started on Jardiance  .  I have instructed the family to follow-up with his PCP next week and discuss about management of his diabetes   HFpEF: Patient appears euvolemic on exam.  Elevated BNP on admission.  Continue home Lasix .     Hypertension: Blood pressure slightly high this morning.  Looks like he does not take any medication at home .  Can follow-up with his PCP and discuss about the need of any antihypertensives if blood pressure remains high  Coronary artery disease/hyperlipidemia: Status post CABG.  No chest pain.  No anginal symptoms.  Continue simvastatin    Mood disorder: Lexapro , mirtazapine    History of vascular dementia: On memantine .He lives with daughter.        Discharge Diagnoses:  Principal Problem:   GI bleed Active Problems:   Chronic heart failure with preserved ejection fraction (HCC)   CKD (chronic kidney disease), stage IV (HCC)   COPD without exacerbation (HCC)   Chronic hypoxic respiratory failure (HCC)  Type 2 diabetes mellitus with hyperglycemia, without long-term current use of insulin  (HCC)   Angiodysplasia of colon with hemorrhage   Decreased hemoglobin    Discharge Instructions  Discharge Instructions     Diet Carb Modified   Complete by: As directed    Discharge instructions   Complete by: As directed    1)Please take your medications as instructed 2)Follow up with your PCP next  week.  Check your  hemoglobin during the follow-up  3)Monitor your blood sugars at home.Follow-up with your PCP closely for the management of your diabetes. 4)Follow up with gastroenterology as an outpatient.   Increase activity slowly   Complete by: As directed       Allergies as of 10/30/2023       Reactions   Meperidine Hcl Swelling, Other (See Comments)   Tongue swelling Because of a history of documented adverse serious drug reaction, Medi Alert bracelet  is recommended.   Sertraline  Shortness Of Breath, Other (See Comments)   Burning sensation from Head to toes, diff breathing   Gabapentin  Other (See Comments)   Confusion   Memantine  Other (See Comments)   Can take only up to 5 mg a day- causes lethargy past that amount   Promethazine Swelling, Other (See Comments)   Tongue swells   Latex Rash, Other (See Comments)   Minor rash   Zolpidem Tartrate Other (See Comments)   Nightmares        Medication List     TAKE these medications    albuterol  108 (90 Base) MCG/ACT inhaler Commonly known as: VENTOLIN  HFA Inhale 2 puffs into the lungs every 6 (six) hours as needed for wheezing or shortness of breath.   Anoro Ellipta  62.5-25 MCG/ACT Aepb Generic drug: umeclidinium-vilanterol USE 1 INHALATION DAILY What changed: See the new instructions.   Carboxymethylcellulose Sodium 0.25 % Soln Place 1 drop into both eyes every 4 (four) hours.   Cholecalciferol  50 MCG (2000 UT) Tabs Take 2,000 Units by mouth in the morning.   cyanocobalamin  500 MCG tablet Commonly known as: VITAMIN B12 Take 1,000 mcg by mouth daily.   empagliflozin  10 MG Tabs tablet Commonly known as: Jardiance  Take 1 tablet (10 mg total) by mouth daily before breakfast.   escitalopram  20 MG tablet Commonly known as: LEXAPRO  Take 20 mg by mouth in the morning.   ferrous sulfate  325 (65 FE) MG tablet Take 1 tablet (325 mg total) by mouth daily with breakfast.   furosemide  40 MG tablet Commonly  known as: LASIX  Take 1 tablet (40 mg total) by mouth daily.   memantine  10 MG tablet Commonly known as: NAMENDA  Take 5 mg by mouth in the morning and at bedtime.   mirtazapine  15 MG tablet Commonly known as: REMERON  Take 0.5 tablets (7.5 mg total) by mouth at bedtime.   octreotide  20 MG injection Commonly known as: SANDOSTATIN  LAR Inject 20 mg into the muscle every 30 (thirty) days. Start taking on: October 31, 2023   OXYGEN  Inhale 6 L/min into the lungs continuous.   pantoprazole  40 MG tablet Commonly known as: PROTONIX  Take 1 tablet (40 mg total) by mouth daily. Start taking on: October 31, 2023   predniSONE  10 MG tablet Commonly known as: DELTASONE  Take 1 tablet (10 mg total) by mouth daily with breakfast. Take 1.5 tablets (15mg  total) daily with breakfast What changed:  how much to take how to take this when to take this   simvastatin  40 MG tablet Commonly known as: ZOCOR  Take 1  tablet (40 mg total) by mouth daily.   sulfamethoxazole -trimethoprim  800-160 MG tablet Commonly known as: BACTRIM  DS Take 1 tablet by mouth 3 (three) times a week. What changed: when to take this        Follow-up Information     Burns, Glade PARAS, MD. Schedule an appointment as soon as possible for a visit in 1 week(s).   Specialty: Internal Medicine Contact information: [address] [place] [address] [phone]                Allergies  Allergen Reactions   Meperidine Hcl Swelling and Other (See Comments)    Tongue swelling Because of a history of documented adverse serious drug reaction, Medi Alert bracelet  is recommended.   Sertraline  Shortness Of Breath and Other (See Comments)    Burning sensation from Head to toes, diff breathing   Gabapentin  Other (See Comments)    Confusion    Memantine  Other (See Comments)    Can take only up to 5 mg a day- causes lethargy past that amount   Promethazine Swelling and Other (See Comments)    Tongue swells    Latex Rash and Other (See Comments)    Minor rash   Zolpidem Tartrate Other (See Comments)    Nightmares    Consultations: GI   Procedures/Studies: DG Chest 2 View Result Date: 10/25/2023 CLINICAL DATA:  Shortness of breath EXAM: CHEST - 2 VIEW COMPARISON:  08/27/2023 and CT scan from 08/28/2023 FINDINGS: Atherosclerotic calcification of the aortic arch. Band of scarring in the right upper lobe appears stable. Blunted right costophrenic angle compatible with right pleural effusion. Prior CABG. Emphysema. Thoracic spondylosis. Overall similar appearance to the 08/27/2023 exam. IMPRESSION: 1. Small right pleural effusion. 2. Stable band of scarring in the right upper lobe. 3. Emphysema. 4. Thoracic spondylosis. 5. Aortic Atherosclerosis (ICD10-I70.0). Electronically Signed   By: Ryan Salvage M.D.   On: 10/25/2023 17:49      Subjective: Patient seen and examined at bedside today.  Hemodynamically stable.  Comfortable.  Hemoglobin in the range of 9 today.  No hematochezia or melena.  Medically stable for discharge.  Discharge Exam: Vitals:   10/30/23 0543 10/30/23 0921  BP:    Pulse:    Resp: 16   Temp:    SpO2:  100%   Vitals:   10/30/23 0116 10/30/23 0412 10/30/23 0543 10/30/23 0921  BP:  (!) 135/59    Pulse:  64    Resp: (!) 21 18 16    Temp:  97.6 F (36.4 C)    TempSrc:      SpO2:  100%  100%  Weight:      Height:        General: Pt is alert, awake, not in acute distress Cardiovascular: RRR, S1/S2 +, no rubs, no gallops Respiratory: CTA bilaterally, no wheezing, no rhonchi Abdominal: Soft, NT, ND, bowel sounds + Extremities: no edema, no cyanosis    The results of significant diagnostics from this hospitalization (including imaging, microbiology, ancillary and laboratory) are listed below for reference.     Microbiology: No results found for this or any previous visit (from the past 240 hours).   Labs: BNP (last 3 results) Recent Labs    08/27/23 2206  BNP  576.0*   Basic Metabolic Panel: Recent Labs  Lab 10/25/23 1707 10/26/23 0539 10/27/23 0537 10/28/23 0447 10/29/23 0444 10/30/23 0508  NA 140 139 139 135 135 135  K 5.4* 4.9 5.5* 5.6* 5.4* 4.8  CL 103 104 103 101 100 100  CO2 24 24 25 24 24 25   GLUCOSE 242* 158* 152* 217* 225* 132*  BUN 54* 56* 50* 53* 57* 67*  CREATININE 2.72* 2.72* 2.46* 2.45* 2.32* 2.41*  CALCIUM 9.1 8.7* 8.8* 8.5* 8.5* 8.3*  MG 2.3  --   --   --   --   --   PHOS  --  3.4  --   --   --   --    Liver Function Tests: Recent Labs  Lab 10/25/23 1707 10/26/23 0539  AST 14*  --   ALT 15  --   ALKPHOS 65  --   BILITOT 0.2  --   PROT 6.1*  --   ALBUMIN 3.9 3.4*   No results for input(s): LIPASE, AMYLASE in the last 168 hours. No results for input(s): AMMONIA in the last 168 hours. CBC: Recent Labs  Lab 10/25/23 1707 10/26/23 0539 10/26/23 1621 10/28/23 0447 10/28/23 1837 10/29/23 0444 10/30/23 0508 10/30/23 1059  WBC 11.8* 10.1  --   --   --  8.2 7.3  --   HGB 8.6* 7.2*   < > 7.3* 10.0* 9.6* 9.0* 9.6*  HCT 29.3* 24.0*   < > 25.0* 32.9* 30.6* 29.8* 31.8*  MCV 101.7* 101.7*  --   --   --  97.1 97.7  --   PLT 182 137*  --   --   --  142* 133*  --    < > = values in this interval not displayed.   Cardiac Enzymes: No results for input(s): CKTOTAL, CKMB, CKMBINDEX, TROPONINI in the last 168 hours. BNP: Invalid input(s): POCBNP CBG: Recent Labs  Lab 10/29/23 2017 10/29/23 2332 10/30/23 0409 10/30/23 0736 10/30/23 1151  GLUCAP 62* 112* 117* 121* 217*   D-Dimer No results for input(s): DDIMER in the last 72 hours. Hgb A1c Recent Labs    10/29/23 0444  HGBA1C 8.2*   Lipid Profile No results for input(s): CHOL, HDL, LDLCALC, TRIG, CHOLHDL, LDLDIRECT in the last 72 hours. Thyroid  function studies No results for input(s): TSH, T4TOTAL, T3FREE, THYROIDAB in the last 72 hours.  Invalid input(s): FREET3 Anemia work up No results for input(s):  VITAMINB12, FOLATE, FERRITIN, TIBC, IRON , RETICCTPCT in the last 72 hours. Urinalysis    Component Value Date/Time   COLORURINE STRAW (A) 08/28/2023 0455   APPEARANCEUR CLEAR 08/28/2023 0455   LABSPEC 1.008 08/28/2023 0455   PHURINE 5.0 08/28/2023 0455   GLUCOSEU 50 (A) 08/28/2023 0455   HGBUR NEGATIVE 08/28/2023 0455   BILIRUBINUR NEGATIVE 08/28/2023 0455   KETONESUR NEGATIVE 08/28/2023 0455   PROTEINUR NEGATIVE 08/28/2023 0455   UROBILINOGEN 1.0 03/02/2010 2320   NITRITE NEGATIVE 08/28/2023 0455   LEUKOCYTESUR NEGATIVE 08/28/2023 0455   Sepsis Labs Recent Labs  Lab 10/25/23 1707 10/26/23 0539 10/29/23 0444 10/30/23 0508  WBC 11.8* 10.1 8.2 7.3   Microbiology No results found for this or any previous visit (from the past 240 hours).  Please note: You were cared for by a hospitalist during your hospital stay. Once you are discharged, your primary care physician will handle any further medical issues. Please note that NO REFILLS for any discharge medications will be authorized once you are discharged, as it is imperative that you return to your primary care physician (or establish a relationship with a primary care physician if you do not have one) for your post hospital discharge needs so that they can reassess your need for medications and monitor your  lab values.    Time coordinating discharge: 40 minutes  SIGNED:   Ivonne Mustache, MD  [facility] 10/30/2023, 1:06 PM Pager [phone]  If 7PM-7AM, please contact night-coverage [email or web address] Password [email or web address]

## 2023-10-30 NOTE — TOC Transition Note (Signed)
 Transition of Care [facility]) - Discharge Note   Patient Details  Name: Brian [name] MRN: [id number] Date of Birth: [birth year]  [facility] CM/SW Contact:  Bascom Service, RN Phone Number: 10/30/2023, 11:36 AM   Clinical Narrative: Spoke to dtr Brian [name]-she was very happy with the care from the entire team-supv informed. No further CM needs.      Final next level of care: Home/Self Care Barriers to Discharge: No Barriers Identified   Patient Goals and CMS Choice Patient states their goals for this hospitalization and ongoing recovery are:: Home          Discharge Placement                       Discharge Plan and Services Additional resources added to the After Visit Summary for                                       Social Drivers of Health (SDOH) Interventions SDOH Screenings   Food Insecurity: No Food Insecurity (10/25/2023)  Housing: Low Risk  (10/25/2023)  Transportation Needs: No Transportation Needs (10/25/2023)  Utilities: Not At Risk (10/25/2023)  Alcohol  Screen: Low Risk  (05/09/2023)  Depression (PHQ2-9): Low Risk  (05/09/2023)  Financial Resource Strain: Low Risk  (05/09/2023)  Physical Activity: Insufficiently Active (05/09/2023)  Social Connections: Socially Isolated (10/25/2023)  Stress: No Stress Concern Present (05/09/2023)  Recent Concern: Stress - Stress Concern Present (04/01/2023)  Tobacco Use: Medium Risk (10/26/2023)  Health Literacy: Adequate Health Literacy (05/09/2023)     Readmission Risk Interventions    10/28/2023   12:10 PM  Readmission Risk Prevention Plan  Transportation Screening Complete  PCP or Specialist Appt within 3-5 Days Complete  HRI or Home Care Consult Complete  Social Work Consult for Recovery Care Planning/Counseling Complete  Palliative Care Screening Not Applicable  Medication Review [employer]) Complete

## 2023-10-31 ENCOUNTER — Telehealth: Payer: Self-pay

## 2023-10-31 NOTE — Transitions of Care (Post Inpatient/ED Visit) (Signed)
   10/31/2023  Name: Brian [name] MRN: [id number] DOB: 05/27/1931  Today's TOC FU Call Status: Today's TOC FU Call Status:: Unsuccessful Call (1st Attempt) Unsuccessful Call (1st Attempt) Date: 10/31/23  Attempted to reach the patient regarding the most recent Inpatient/ED visit.  Follow Up Plan: Additional outreach attempts will be made to reach the patient to complete the Transitions of Care (Post Inpatient/ED visit) call.   [name] J. Abreanna Drawdy RN, MSN [facility] RN Care Manager Direct Dial: [phone]  Fax: [phone] Website: [email or web address]

## 2023-11-01 ENCOUNTER — Telehealth: Payer: Self-pay | Admitting: *Deleted

## 2023-11-01 NOTE — Transitions of Care (Post Inpatient/ED Visit) (Signed)
 11/01/2023  Name: Brian [name] MRN: [id number] DOB: 1932/01/24  Today's TOC FU Call Status: Today's TOC FU Call Status:: Successful TOC FU Call Completed TOC FU Call Complete Date: 11/01/23 Patient's Name and Date of Birth confirmed.  Transition Care Management Follow-up Telephone Call Date of Discharge: 10/30/23 Discharge Facility: [facility]) Type of Discharge: Inpatient Admission Primary Inpatient Discharge Diagnosis:: GI Bleed How have you been since you were released from the hospital?: Better (eating, drinking well, no signs/ symptoms GI bleeding) Any questions or concerns?: No  Items Reviewed: Did you receive and understand the discharge instructions provided?: Yes Medications obtained,verified, and reconciled?: Yes (Medications Reviewed) Any new allergies since your discharge?: No Dietary orders reviewed?: Yes Type of Diet Ordered:: heart healthy, carbohydrate modified Do you have support at home?: Yes People in Home [RPT]: child(ren), adult Name of Support/Comfort Primary Source: adult daughter Brian [name]  Medications Reviewed Today: Medications Reviewed Today     Reviewed by Aura Mliss DELENA, RN (Registered Nurse) on 11/01/23 at 1344  Med List Status: <None>   Medication Order Taking? Sig Documenting Provider Last Dose Status Informant  albuterol  (VENTOLIN  HFA) 108 (90 Base) MCG/ACT inhaler [id number] Yes Inhale 2 puffs into the lungs every 6 (six) hours as needed for wheezing or shortness of breath. Geofm Glade PARAS, MD  Active Family Member  ANORO ELLIPTA  62.5-25 MCG/ACT AEPB [id number] Yes USE 1 INHALATION DAILY Geofm Glade PARAS, MD  Active Family Member  Carboxymethylcellulose Sodium 0.25 % SOLN [id number] Yes Place 1 drop into both eyes every 4 (four) hours. [provider]  Active Family Member  Cholecalciferol  50 MCG (2000 UT) TABS [id number] Yes Take 2,000 Units by mouth in the morning. [provider]  Active Family Member  cyanocobalamin   (VITAMIN B12) 500 MCG tablet [id number] Yes Take 1,000 mcg by mouth daily. [provider]  Active Family Member  empagliflozin  (JARDIANCE ) 10 MG TABS tablet [id number] Yes Take 1 tablet (10 mg total) by mouth daily before breakfast. Jillian Buttery, MD  Active   escitalopram  (LEXAPRO ) 20 MG tablet [id number] Yes Take 20 mg by mouth in the morning. [provider]  Active Family Member  ferrous sulfate  325 (65 FE) MG tablet [id number] Yes Take 1 tablet (325 mg total) by mouth daily with breakfast. Geofm Glade PARAS, MD  Active Family Member  furosemide  (LASIX ) 40 MG tablet [id number] Yes Take 1 tablet (40 mg total) by mouth daily. Rosario Leatrice FERNS, MD  Active Family Member  memantine  (NAMENDA ) 10 MG tablet [id number] Yes Take 5 mg by mouth in the morning and at bedtime. [provider]  Active Family Member  mirtazapine  (REMERON ) 15 MG tablet [id number] Yes Take 0.5 tablets (7.5 mg total) by mouth at bedtime. Geofm Glade PARAS, MD  Active Family Member  octreotide  (SANDOSTATIN  LAR) 20 MG injection [id number] Yes Inject 20 mg into the muscle every 30 (thirty) days. Jillian Buttery, MD  Active   OXYGEN  [id number] Yes Inhale 6 L/min into the lungs continuous.  Patient taking differently: Inhale 4-6 L/min into the lungs continuous.   [provider]  Active Family Member  pantoprazole  (PROTONIX ) 40 MG tablet [id number] Yes Take 1 tablet (40 mg total) by mouth daily. Jillian Buttery, MD  Active   predniSONE  (DELTASONE ) 10 MG tablet [id number] Yes Take 1 tablet (10 mg total) by mouth daily with breakfast. Take 1.5 tablets (15mg  total) daily with breakfast Jillian Buttery, MD  Active   simvastatin  (ZOCOR ) 40 MG tablet [id number] Yes Take 1 tablet (40  mg total) by mouth daily. Geofm Glade PARAS, MD  Active Family Member  sulfamethoxazole -trimethoprim  (BACTRIM  DS) 800-160 MG tablet [id number] Yes Take 1 tablet by mouth 3 (three) times a week. Shelah Lamar RAMAN, MD  Active Family Member  Med List  Note Brian [name], CPhT 08/28/23 0130): PATIENT HAS DIFFICULTY SWALLOWING LARGE PILLS; PLEASE BREAK IN HALF. All local 30 day or less are filled at [facility] on [address].            Home Care and Equipment/Supplies: Were Home Health Services Ordered?: No Any new equipment or medical supplies ordered?: No  Functional Questionnaire: Do you need assistance with bathing/showering or dressing?: Yes (shower seat with back) Do you need assistance with meal preparation?: Yes (daughter) Do you need assistance with eating?: No Do you have difficulty maintaining continence: No Do you need assistance with getting out of bed/getting out of a chair/moving?: Yes (walker, cane) Do you have difficulty managing or taking your medications?: Yes (adult daughter provides all oversight)  Follow up appointments reviewed: PCP Follow-up appointment confirmed?: Yes Date of PCP follow-up appointment?: 11/05/23 Follow-up Provider: Glade Geofm MD Specialist Hospital Follow-up appointment confirmed?: Yes Date of Specialist follow-up appointment?:  ([facility] states [facility] and [facility]) Follow-[facility]ty [facility] Do you need transportation to your follow-up appointment?: No Do you understand care options if your condition(s) worsen?: Yes-patient verbalized understanding  SDOH Interventions Today    Flowsheet Row Most Recent Value  SDOH Interventions   Food Insecurity Interventions Intervention Not Indicated  Housing Interventions Intervention Not Indicated  Transportation Interventions Intervention Not Indicated  Utilities Interventions Intervention Not Indicated    Goals Addressed             This Visit's Progress    VBCI Transitions of Care (TOC) Care Plan       Problems:  Recent Hospitalization for treatment of GI bleed and Alzheimer's dementia Spoke with daughter Jackson, [place] (lives with pt) who reports pt is feeling much better, has had no  further GI bleeding since hospital discharge, continues on oxygen  4-6 liters via [place] continuously, 02 saturations checked throughout the day,  CBG checked QID, pt has all necessary DME in the home, pt to follow up with GI doctor at [facility]  Goal:  Over the next 30 days, the patient will not experience hospital readmission  Interventions:  Transitions of Care: Doctor Visits  - discussed the importance of doctor visits Reviewed Signs and symptoms of infection Evaluation of current treatment plan related to GI bleed, Memory Deficits, Inability to perform ADL's independently, and Inability to perform IADL's independently self-management and patient's adherence to plan as established by provider. Discussed plans with patient for ongoing care management follow up and provided patient with direct contact information for care management team Evaluation of current treatment plan related to GI bleed and patient's adherence to plan as established by provider Provided education to patient re: signs/ symptoms GI bleeding, reportable signs /symptoms Reviewed medications with patient and discussed importance of taking as prescribed Reviewed scheduled/upcoming provider appointments including 11/05/23 Discussed plans with patient for ongoing care management follow up and provided patient with direct contact information for care management team Screening for signs and symptoms of depression related to chronic disease state  Assessed social determinant of health barriers Reviewed oxygen  safety Reviewed safety precautions, importance of using cane and walker Reviewed care/ everyday tips for patient's with dementia  Patient Self Care Activities:  Attend all scheduled provider appointments Attend  church or other social activities Call pharmacy for medication refills 3-7 days in advance of running out of medications Call provider office for new concerns or questions  Notify RN Care Manager of TOC call rescheduling  needs Participate in Transition of Care Program/Attend TOC scheduled calls Take medications as prescribed   Report any GI bleeding immediately to your provider, seek emergency assistance as needed Everyday tips for care of patient with dementia- Keep a routine, such as bathing dressing eating at the same time each day, plan activities that the person enjoys and try to do them at the same time each day, serve meals in a consistent, familiar place and give patient enough time to eat, encourage use of loose-fitting, comfortable, easy to use clothing such as, elastic waistbands, fabric fasteners, or large zipper pulls instead of shoelaces, buttons, or buckles  Plan:  Telephone follow up appointment with care management team member scheduled for:  11/07/23 @ 145 pm with Laine Tousey RN        Mliss Creed [facility], BSN RN Care Manager/ Transition of Care [facility] [phone]

## 2023-11-01 NOTE — Telephone Encounter (Signed)
 This encounter was created in error - please disregard.

## 2023-11-04 ENCOUNTER — Other Ambulatory Visit: Payer: Self-pay | Admitting: Radiology

## 2023-11-04 ENCOUNTER — Telehealth: Payer: Self-pay | Admitting: Radiation Oncology

## 2023-11-04 DIAGNOSIS — R911 Solitary pulmonary nodule: Secondary | ICD-10-CM

## 2023-11-04 NOTE — Progress Notes (Unsigned)
 Subjective:    Patient ID: Brian [name], male    DOB: [birth year], 89 y.o.   MRN: [id number]     HPI Brian [name] is here for follow up from the hospital.   Admitted 9/5-9/10  Presented with bloody stools.  He has a history of GI bleeding with prior cecal AVMs who presented to the ED with dark stools times few days.  He also was experiencing increased dyspnea on exertion and fatigue.  He denied any chest pain, dizziness, abdominal pain, nausea, vomiting.  Initial labs showed a hemoglobin of 8.6, WBC 11.8, potassium 5.4, BUN/creatinine 54/2.72, troponin 58, proBNP 1612.  Chest x-ray showed small right pleural effusion.  He received IV Protonix  40 mg x 1, GI consulted.   Acute blood loss anemia, GI bleed, history of AVMs: Baseline hemoglobin around 9-presented with hemoglobin 8.6 Black tarry stools, history of AVMs GI consulted Started on IV Protonix  Iron  supplementation continued Hemoglobin dropped to 7, given 1 unit blood transfusion No new episodes of hematochezia Hemoglobin stable around 9 upon discharge Received IV iron  for low iron  Cecal AVM suspected-started on octreotide  GI recommended p.o. tried IM as an outpatient once a month Diet advanced to solids   CKD stage IV, hyperkalemia: Baseline creatinine 2.5-2.6 Kidney function at baseline upon discharge   COPD, chronic hypoxic respiratory failure: On continuous oxygen  4-6 L/min No change in oxygen  requirements Continued home inhalers  Was on 15 mg of prednisone  at home-decrease to 10 mg because of persistent high sugar   Type 2 diabetes: Recent A1c 7.1% in July Sugars running high-A1c in hospital 8%-likely from prednisone  Not on any diabetic medication-started on Jardiance    HFpEF: Appeared euvolemic Elevated BNP on admission Continued home Lasix  Started on Jardiance    Hypertension: BP slightly elevated morning of discharge They did not feel he was taking his medication at home, but could be getting  BP medications from [place]   CAD, hyperlipidemia: S/p CABG No anginal symptoms Continue simvastatin    Mood disorder: Stable Continue Lexapro , mirtazapine    Vascular, neurodegenerative dementia: On memantine    Medications and allergies reviewed with patient and updated if appropriate.  Current Outpatient Medications on File Prior to Visit  Medication Sig Dispense Refill   albuterol  (VENTOLIN  HFA) 108 (90 Base) MCG/ACT inhaler Inhale 2 puffs into the lungs every 6 (six) hours as needed for wheezing or shortness of breath. 18 g 4   ANORO ELLIPTA  62.5-25 MCG/ACT AEPB USE 1 INHALATION DAILY 180 each 3   Carboxymethylcellulose Sodium 0.25 % SOLN Place 1 drop into both eyes every 4 (four) hours.     Cholecalciferol  50 MCG (2000 UT) TABS Take 2,000 Units by mouth in the morning.     cyanocobalamin  (VITAMIN B12) 500 MCG tablet Take 1,000 mcg by mouth daily.     empagliflozin  (JARDIANCE ) 10 MG TABS tablet Take 1 tablet (10 mg total) by mouth daily before breakfast. 30 tablet 0   escitalopram  (LEXAPRO ) 20 MG tablet Take 20 mg by mouth in the morning.     ferrous sulfate  325 (65 FE) MG tablet Take 1 tablet (325 mg total) by mouth daily with breakfast. 90 tablet 1   furosemide  (LASIX ) 40 MG tablet Take 1 tablet (40 mg total) by mouth daily. 15 tablet 0   memantine  (NAMENDA ) 10 MG tablet Take 5 mg by mouth in the morning and at bedtime.     mirtazapine  (REMERON ) 15 MG tablet Take 0.5 tablets (7.5 mg total) by mouth at bedtime.  octreotide  (SANDOSTATIN  LAR) 20 MG injection Inject 20 mg into the muscle every 30 (thirty) days. 1 each 0   OXYGEN  Inhale 6 L/min into the lungs continuous. (Patient taking differently: Inhale 4-6 L/min into the lungs continuous.)     pantoprazole  (PROTONIX ) 40 MG tablet Take 1 tablet (40 mg total) by mouth daily. 30 tablet 0   predniSONE  (DELTASONE ) 10 MG tablet Take 1 tablet (10 mg total) by mouth daily with breakfast. Take 1.5 tablets (15mg  total) daily with breakfast      simvastatin  (ZOCOR ) 40 MG tablet Take 1 tablet (40 mg total) by mouth daily. 90 tablet 3   sulfamethoxazole -trimethoprim  (BACTRIM  DS) 800-160 MG tablet Take 1 tablet by mouth 3 (three) times a week. 12 tablet 5   No current facility-administered medications on file prior to visit.     Review of Systems     Objective:  There were no vitals filed for this visit. BP Readings from Last 3 Encounters:  10/30/23 (!) 135/59  10/10/23 134/62  09/27/23 135/70   Wt Readings from Last 3 Encounters:  10/26/23 154 lb 1.6 oz (69.9 kg)  10/10/23 158 lb (71.7 kg)  10/03/23 149 lb 12.8 oz (67.9 kg)   There is no height or weight on file to calculate BMI.    Physical Exam     Lab Results  Component Value Date   WBC 7.3 10/30/2023   HGB 9.6 (L) 10/30/2023   HCT 31.8 (L) 10/30/2023   PLT 133 (L) 10/30/2023   GLUCOSE 132 (H) 10/30/2023   CHOL 163 04/01/2023   TRIG 134.0 04/01/2023   HDL 68.20 04/01/2023   LDLCALC 68 04/01/2023   ALT 15 10/25/2023   AST 14 (L) 10/25/2023   NA 135 10/30/2023   K 4.8 10/30/2023   CL 100 10/30/2023   CREATININE 2.41 (H) 10/30/2023   BUN 67 (H) 10/30/2023   CO2 25 10/30/2023   TSH 1.24 09/28/2022   PSA 1.42 07/16/2007   INR 1.2 01/21/2019   HGBA1C 8.2 (H) 10/29/2023   MICROALBUR 9.45 03/19/2018     Assessment & Plan:    See Problem List for Assessment and Plan of chronic medical problems.

## 2023-11-04 NOTE — Telephone Encounter (Signed)
 Pt's daughter called to r/s upcoming appt for .pt. 9/18 f/u to review CT. R/s to 9/29@11 :15am/

## 2023-11-04 NOTE — Patient Instructions (Incomplete)
      Blood work was ordered.       Medications changes include :   None    A referral was ordered and someone will call you to schedule an appointment.     No follow-ups on file.

## 2023-11-05 ENCOUNTER — Ambulatory Visit (INDEPENDENT_AMBULATORY_CARE_PROVIDER_SITE_OTHER): Admitting: Internal Medicine

## 2023-11-05 VITALS — BP 130/60 | HR 78 | Temp 98.7°F | Ht 71.0 in

## 2023-11-05 DIAGNOSIS — J9611 Chronic respiratory failure with hypoxia: Secondary | ICD-10-CM

## 2023-11-05 DIAGNOSIS — N184 Chronic kidney disease, stage 4 (severe): Secondary | ICD-10-CM | POA: Diagnosis not present

## 2023-11-05 DIAGNOSIS — E1122 Type 2 diabetes mellitus with diabetic chronic kidney disease: Secondary | ICD-10-CM

## 2023-11-05 DIAGNOSIS — F419 Anxiety disorder, unspecified: Secondary | ICD-10-CM

## 2023-11-05 DIAGNOSIS — Z7984 Long term (current) use of oral hypoglycemic drugs: Secondary | ICD-10-CM

## 2023-11-05 DIAGNOSIS — K922 Gastrointestinal hemorrhage, unspecified: Secondary | ICD-10-CM

## 2023-11-05 DIAGNOSIS — D62 Acute posthemorrhagic anemia: Secondary | ICD-10-CM

## 2023-11-05 DIAGNOSIS — Z794 Long term (current) use of insulin: Secondary | ICD-10-CM

## 2023-11-05 DIAGNOSIS — E1169 Type 2 diabetes mellitus with other specified complication: Secondary | ICD-10-CM

## 2023-11-05 DIAGNOSIS — E1159 Type 2 diabetes mellitus with other circulatory complications: Secondary | ICD-10-CM

## 2023-11-05 DIAGNOSIS — F01A0 Vascular dementia, mild, without behavioral disturbance, psychotic disturbance, mood disturbance, and anxiety: Secondary | ICD-10-CM

## 2023-11-05 DIAGNOSIS — G479 Sleep disorder, unspecified: Secondary | ICD-10-CM

## 2023-11-05 DIAGNOSIS — I10 Essential (primary) hypertension: Secondary | ICD-10-CM

## 2023-11-05 DIAGNOSIS — I5032 Chronic diastolic (congestive) heart failure: Secondary | ICD-10-CM

## 2023-11-05 DIAGNOSIS — I152 Hypertension secondary to endocrine disorders: Secondary | ICD-10-CM

## 2023-11-05 MED ORDER — INSULIN PEN NEEDLE 32G X 6 MM MISC: 3 refills | Status: DC

## 2023-11-05 MED ORDER — INSULIN GLARGINE 100 UNIT/ML ~~LOC~~ SOLN: 10.0000 [IU] | Freq: Every day | SUBCUTANEOUS | 3 refills | Status: DC

## 2023-11-05 NOTE — Assessment & Plan Note (Signed)
 Chronic Sleeping good, but sleeping too much Currently taking mirtazapine  15 mg at bedtime Decrease mirtazapine  to 7.5 mg at bedtime

## 2023-11-05 NOTE — Assessment & Plan Note (Signed)
 Chronic Currently on 4L/min of oxygen  SOB stable On chronic prednisone  -- decreased from 15 mg daily to 10 mg daily On Bactrim  DS 1 tab TIW Continue albuterol  inhaler as needed, Anoro inhaler daily

## 2023-11-05 NOTE — Assessment & Plan Note (Signed)
 Recent hospitalized for BRBPR  Likely related to cecal AVMs S/p 1 unit PRBCs, IV iron  H/h stable On pantoprazole  40 mg daily On octreotide  20 mg Q month for prevention

## 2023-11-05 NOTE — Assessment & Plan Note (Signed)
 Chronic On memantine  5 mg bid

## 2023-11-05 NOTE — Assessment & Plan Note (Addendum)
 Chronic With CKD stage 4  Lab Results  Component Value Date   HGBA1C 8.2 (H) 10/29/2023   Sugars not controlled - likely related to prednisone  Continue jardiance  10 mg daily Sugars at home in 200's - 300's Start lantus  10 units daily -- will slowly increase to get sugars better controlled Monitor sugars closely-update me with readings

## 2023-11-05 NOTE — Assessment & Plan Note (Signed)
 Related to GI bleed - likely cecal AVMs S/p 1 unit PRBCs, IV iron  H/h stable On octreotide  20 mg Q month for prevention

## 2023-11-05 NOTE — Assessment & Plan Note (Signed)
 Chronic Blood pressure well-controlled  Only on Lasix  40 mg daily Monitor BP at home

## 2023-11-05 NOTE — Assessment & Plan Note (Signed)
 Chronic Controlled  Continue Lexapro  10 mg daily, Remeron  15 mg at bedtime

## 2023-11-05 NOTE — Assessment & Plan Note (Signed)
 Chronic Appears euvolemic Following with cardiology Continue furosemide  40 mg daily

## 2023-11-05 NOTE — Assessment & Plan Note (Signed)
 Chronic GFR stable Following with nephrology-Dr. Gearline He does appear to be euvolemic Continue Lasix  40 mg daily, jardiance  10 mg daily

## 2023-11-06 ENCOUNTER — Telehealth: Payer: Self-pay

## 2023-11-06 ENCOUNTER — Encounter: Payer: Self-pay | Admitting: Internal Medicine

## 2023-11-06 ENCOUNTER — Other Ambulatory Visit: Payer: Self-pay

## 2023-11-06 MED ORDER — INSULIN GLARGINE 100 UNIT/ML SOLOSTAR PEN: 10.0000 [IU] | PEN_INJECTOR | Freq: Every day | SUBCUTANEOUS | 11 refills | Status: DC

## 2023-11-06 NOTE — Telephone Encounter (Signed)
 Copied from CRM [id number]. Topic: Clinical - Prescription Issue >> Nov 06, 2023 10:52 AM Burnard DEL wrote: Reason for CRM: Patients daughter called in stating that  insulin  glargine (LANTUS ) 100 UNIT/ML injection were suppose to be the pens instead of the injection that has to be drawn up.She would like to know if a new prescription or the pens could be sen to the pharmacy?  [id number] [place], [place] - [address] AT [facility] [address]  Phone: [phone] Fax: [phone]

## 2023-11-06 NOTE — Telephone Encounter (Signed)
 My-chart message sent to [facility] to address.

## 2023-11-07 ENCOUNTER — Ambulatory Visit: Payer: Self-pay | Admitting: Radiation Oncology

## 2023-11-07 ENCOUNTER — Other Ambulatory Visit: Payer: Self-pay | Admitting: *Deleted

## 2023-11-07 NOTE — Transitions of Care (Post Inpatient/ED Visit) (Signed)
 Transition of Care week 2/ day # 6  Visit Note  11/07/2023  Name: Brian [name] MRN: [id number]          DOB: 25-Oct-1931  Situation: Patient enrolled in [facility] 30-day program. Visit completed with patient's daughter/ caregiver [name]- verified on [facility] DPR by telephone.   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 (2) unplanned hospital admission x last (12) and (6) months  Baseline vascular dementia:  Resides with caregiver/ daughter who manages all aspects of patient's daily care/ healthcare needs  Initial Transition Care Management Follow-up Telephone Call    Past Medical History:  Diagnosis Date   AAA (abdominal aortic aneurysm) (HCC)    Arthritis    Asthma 1976   Bronchiolitis 06/2014   CAD (coronary artery disease)    NSTEMI 02/2010;  s/p CABG 1/12 (L-LAD, SOM1, S-PDA); echo in 08/2010: Mild LVH, EF 55%, grade 2 diastolic dysfunction, mild MR, mild LAE, mildly reduced RV function, mild RAE, PASP 42   Carpal tunnel syndrome of left wrist    Cecal angiodysplasia 04/04/2022   Colon polyps    COPD (chronic obstructive pulmonary disease) (HCC)    Diabetes mellitus 2000   Type II, controlls with diet   Dyspnea    ED (erectile dysfunction)    Gout    History of radiation therapy 03/09/2020-03/16/2020   SBRT right lung: Dr. Lynwood Nasuti   HTN (hypertension)    Hyperlipidemia    Inguinal hernia    Right   Macular degeneration    Myocardial infarction (HCC) 1 -13-2012   Neuropathy    Assessment:  Things are going well; we are so hoping these ocreotide injections work to keep him from having more bleeding episodes.  So far, so good; He is doing well, eating good, no signs of bleeding at this time.  We are checking his blood sugars and now that we have the solostar insulin  pen, we'll start giving him that today.  Things are back to normal and there are no concerns     Denies clinical concerns and sounds to be in no distress throughout [facility] 30-day program outreach call today  Patient Reported Symptoms: Cognitive Cognitive Status: No symptoms reported, Requires Assistance Decision Making, Struggling with memory recall (per caregiver/ daughter) Cognitive/Intellectual Conditions Management [RPT]: Other Other: per caregiver/ daughter: he has his regular memory issues that he has had for years      Neurological Neurological Review of Symptoms: No symptoms reported, Other: (per caregiver/ daughter) Oher Neurological Symptoms/Conditions [RPT]: per caregiver/ daughter: baseline memory issues due to mild vascular dementia Neurological Management Strategies: Routine screening, Adequate rest, Coping strategies  HEENT HEENT Symptoms Reported: No symptoms reported (per daughter/ caregiver)      Cardiovascular Cardiovascular Symptoms Reported: No symptoms reported Does patient have uncontrolled Hypertension?: No Cardiovascular Management Strategies: Routine screening, Coping strategies, Medication therapy, Adequate rest  Respiratory Respiratory Symptoms Reported: No symptoms reported Other Respiratory Symptoms: Caregiver reports patient breathing fine; confirms no shortness of breath outside of baseline; continues to use home O2- currently at 4 L/min as per baseline; Confirmed/ reinforced action plan for shortness of breath: verbalizes excellent ongoing understanding/ adherence to same;  provided reinforcement around basic safe use of home O2-- caregiver able to verbalize all without additional prompting Respiratory Management Strategies: Oxygen  therapy, Routine screening, Asthma action plan, Adequate rest, Medication therapy, Coping strategies  Endocrine Endocrine Symptoms Reported: No symptoms reported Is patient  diabetic?: Yes Is patient checking blood sugars at home?: Yes List most recent blood sugar readings, include date and time of day: Per caregiver-  Reports blood sugars still on high side; confirms long acting insulin  has been obtained- she plans to start giving soon; now that she has needed autopen after outreaching PCP for order clarification; reports post-prandial (mid-day) blood sugar of 227; Provided education around blood sugar management at home; need to follow carbohydrate modified diet; use of long- acting insulin ; signs/ symptoms low blood sugar with corresponding action plan; strategies to avoid hypoglycemia (timing of snacks);  Reviewed most recent A1C value from 10/29/23: 8.2; reviewed historical trends over time with caregiver who verbalizes good understanding of same    Gastrointestinal Gastrointestinal Symptoms Reported: No symptoms reported Additional Gastrointestinal Details: per caregiver patient eating good, good appetite; having normal and regular BM's- no signs of blood in BM's; Reinforced signs/ symptoms GI bleeding along with corresponding action plan- caregiver able to verbalize without additional prompting Gastrointestinal Management Strategies: Coping strategies, Diet modification, Adequate rest    Genitourinary Genitourinary Symptoms Reported: No symptoms reported (per caregiver)    Integumentary Integumentary Symptoms Reported: No symptoms reported (per caregiver)    Musculoskeletal Musculoskelatal Symptoms Reviewed: Limited mobility (per caregiver) Additional Musculoskeletal Details: confirmed uses assistive devices on regular basis, at baseline -- walker/ cane; denies new- recent falls; provided reinforcement around fall prevention Musculoskeletal Management Strategies: Medical device, Coping strategies, Routine screening      Psychosocial Psychosocial Symptoms Reported: No symptoms reported (per caregiver) Behavioral Management Strategies: Support system Major Change/Loss/Stressor/Fears (CP): Denies (per caregiver) Techniques to Cope with Loss/Stress/Change: Diversional activities Quality of Family  Relationships: helpful, involved, supportive (per caregiver)   There were no vitals filed for this visit.  Medications Reviewed Today     Reviewed by Gissell Barra M, RN (Registered Nurse) on 11/07/23 at 1345  Med List Status: <None>   Medication Order Taking? Sig Documenting Provider Last Dose Status Informant  albuterol  (VENTOLIN  HFA) 108 (90 Base) MCG/ACT inhaler [id number]  Inhale 2 puffs into the lungs every 6 (six) hours as needed for wheezing or shortness of breath. Geofm Glade PARAS, MD  Active Family Member  ANORO ELLIPTA  62.5-25 MCG/ACT AEPB [id number]  USE 1 INHALATION DAILY Geofm Glade PARAS, MD  Active Family Member  Carboxymethylcellulose Sodium 0.25 % SOLN [id number]  Place 1 drop into both eyes every 4 (four) hours. [provider]  Active Family Member  Cholecalciferol  50 MCG (2000 UT) TABS [id number]  Take 2,000 Units by mouth in the morning. [provider]  Active Family Member  cyanocobalamin  (VITAMIN B12) 500 MCG tablet [id number]  Take 1,000 mcg by mouth daily. [provider]  Active Family Member  empagliflozin  (JARDIANCE ) 10 MG TABS tablet [id number]  Take 1 tablet (10 mg total) by mouth daily before breakfast. Jillian Buttery, MD  Active   escitalopram  (LEXAPRO ) 20 MG tablet [id number]  Take 20 mg by mouth in the morning. [provider]  Active Family Member  ferrous sulfate  325 (65 FE) MG tablet [id number]  Take 1 tablet (325 mg total) by mouth daily with breakfast. Geofm Glade PARAS, MD  Active Family Member  furosemide  (LASIX ) 40 MG tablet [id number]  Take 1 tablet (40 mg total) by mouth daily. Rosario Leatrice FERNS, MD  Active Family Member  insulin  glargine (LANTUS ) 100 UNIT/ML Solostar Pen [id number]  Inject 10 Units into the skin daily. Geofm Glade PARAS, MD  Active   Insulin  Pen Needle 32G X 6 MM MISC [id number]  UAD for daily lantus  injections Geofm Glade PARAS, MD  Active   memantine  (NAMENDA ) 10 MG tablet [id number]  Take 5 mg by mouth in the morning  and at bedtime. [provider]  Active Family Member  mirtazapine  (REMERON ) 15 MG tablet [id number]  Take 0.5 tablets (7.5 mg total) by mouth at bedtime. Geofm Glade PARAS, MD  Active Family Member  octreotide  (SANDOSTATIN  LAR) 20 MG injection [id number]  Inject 20 mg into the muscle every 30 (thirty) days. Jillian Buttery, MD  Active   OXYGEN  [id number]  Inhale 6 L/min into the lungs continuous.  Patient taking differently: Inhale 4-6 L/min into the lungs continuous.   [provider]  Active Family Member  pantoprazole  (PROTONIX ) 40 MG tablet [id number]  Take 1 tablet (40 mg total) by mouth daily. Jillian Buttery, MD  Active   predniSONE  (DELTASONE ) 10 MG tablet [id number]  Take 1 tablet (10 mg total) by mouth daily with breakfast. Take 1.5 tablets (15mg  total) daily with breakfast Jillian Buttery, MD  Active   simvastatin  (ZOCOR ) 40 MG tablet [id number]  Take 1 tablet (40 mg total) by mouth daily. Geofm Glade PARAS, MD  Active Family Member  sulfamethoxazole -trimethoprim  (BACTRIM  DS) 800-160 MG tablet [id number]  Take 1 tablet by mouth 3 (three) times a week. Shelah Lamar RAMAN, MD  Active Family Member  Med List Note Leobardo Garre, CPhT 08/28/23 0130): PATIENT HAS DIFFICULTY SWALLOWING LARGE PILLS; PLEASE BREAK IN HALF. All local 30 day or less are filled at [facility] on [address].           Recommendation:   Continue Current Plan of Care  Follow Up Plan:   Telephone follow-up in 1 week- as scheduled 11/14/23  Pls call/ message for questions,  Cozette Braggs Mckinney Kanda Deluna, RN, BSN, CCRN Alumnus RN Care Manager  Transitions of Care  [facility] - [facility] [phone]: direct office

## 2023-11-07 NOTE — Patient Instructions (Signed)
 Visit Information  Thank you for taking time to visit with me today. Please don't hesitate to contact me if I can be of assistance to you before our next scheduled telephone appointment.  Our next appointment is by telephone on Thursday, November 14, 2023 at 1:45 pm  Please call the care guide team at [phone] if you need to cancel or reschedule your appointment.   Following are the goals we discussed today:  Patient Self Care Activities:  Attend all scheduled provider appointments Call provider office for new concerns or questions  Participate in Transition of Care Program/Attend TOC scheduled calls Take medications as prescribed   Report any signs/ symptoms GI bleeding ( as we have discussed during our phone calls) immediately to your provider, seek emergency assistance as needed Everyday tips for care of patient with dementia- Keep a routine, such as bathing dressing eating at the same time each day, plan activities that the person enjoys and try to do them at the same time each day, serve meals in a consistent, familiar place and give patient enough time to eat, encourage use of loose-fitting, comfortable, easy to use clothing such as, elastic waistbands, fabric fasteners, or large zipper pulls instead of shoelaces, buttons, or buckles Continue pacing activity as your recuperation from recent hospital visit continues Continue monitoring and recording your blood sugars at home Continue using home oxygen  as prescribed Use assistive devices as needed to prevent falls If you believe your condition is getting worse- contact your care providers (doctors) promptly- reaching out to your doctor early when you have concerns can prevent you from having to go to the hospital  If you are experiencing a Mental Health or Behavioral Health Crisis or need someone to talk to, please  call the Suicide and Crisis Lifeline: 988 call the [place]  National Suicide Prevention Lifeline: 406 266 7381 or TTY:  204-775-8951 TTY (864)681-9978) to talk to a trained counselor call 1-800-273-TALK (toll free, 24 hour hotline) go to [facility] [address] [place] [phone]) call the [facility] Crisis Line: [phone] call 911   Patient's daughter- caregiver verbalizes understanding of instructions and care plan provided today and agrees to view in MyChart. Active MyChart status and patient understanding of how to access instructions and care plan via MyChart confirmed with patient.     Kemond Amorin Mckinney Sadye Kiernan, RN, BSN, Media planner  Transitions of Care  [facility] - [facility] [phone]: direct office

## 2023-11-08 ENCOUNTER — Other Ambulatory Visit: Payer: Self-pay

## 2023-11-08 ENCOUNTER — Encounter (HOSPITAL_COMMUNITY): Payer: Self-pay | Admitting: Emergency Medicine

## 2023-11-08 ENCOUNTER — Emergency Department (HOSPITAL_COMMUNITY)
Admission: EM | Admit: 2023-11-08 | Discharge: 2023-11-09 | Disposition: A | Discharge status: Home / Self Care | Attending: Emergency Medicine | Admitting: Emergency Medicine

## 2023-11-08 DIAGNOSIS — Z794 Long term (current) use of insulin: Secondary | ICD-10-CM | POA: Insufficient documentation

## 2023-11-08 DIAGNOSIS — R739 Hyperglycemia, unspecified: Secondary | ICD-10-CM | POA: Insufficient documentation

## 2023-11-08 DIAGNOSIS — Z91040 Latex allergy status: Secondary | ICD-10-CM | POA: Diagnosis not present

## 2023-11-08 DIAGNOSIS — N189 Chronic kidney disease, unspecified: Secondary | ICD-10-CM | POA: Diagnosis not present

## 2023-11-08 DIAGNOSIS — E1165 Type 2 diabetes mellitus with hyperglycemia: Secondary | ICD-10-CM | POA: Diagnosis not present

## 2023-11-08 DIAGNOSIS — Z7984 Long term (current) use of oral hypoglycemic drugs: Secondary | ICD-10-CM | POA: Insufficient documentation

## 2023-11-08 LAB — CBG MONITORING, ED: Glucose-Capillary: 380 mg/dL — ABNORMAL HIGH (ref 70–99)

## 2023-11-08 NOTE — ED Triage Notes (Signed)
 Pt arrives w/ daughter w/ c/o hyperglycemia. States he does take prednisone  but has been on it for a year. States she checked his sugar and it read HI. Blood sugar 380 in triage. Denies any s/s.

## 2023-11-09 LAB — URINALYSIS, ROUTINE W REFLEX MICROSCOPIC
Bacteria, UA: NONE SEEN
Bilirubin Urine: NEGATIVE
Glucose, UA: >=500 mg/dL — AB
Hgb urine dipstick: NEGATIVE
Ketones, ur: NEGATIVE mg/dL
Leukocytes,Ua: NEGATIVE
Nitrite: NEGATIVE
Protein, ur: NEGATIVE mg/dL
Specific Gravity, Urine: 1.016 (ref 1.005–1.030)
pH: 5 (ref 5.0–8.0)

## 2023-11-09 LAB — COMPREHENSIVE METABOLIC PANEL WITH GFR
ALT: 24 U/L (ref 0–44)
AST: 18 U/L (ref 15–41)
Albumin: 4 g/dL (ref 3.5–5.0)
Alkaline Phosphatase: 92 U/L (ref 38–126)
Anion gap: 14 (ref 5–15)
BUN: 77 mg/dL — ABNORMAL HIGH (ref 8–23)
CO2: 23 mmol/L (ref 22–32)
Calcium: 9.1 mg/dL (ref 8.9–10.3)
Chloride: 101 mmol/L (ref 98–111)
Creatinine, Ser: 2.9 mg/dL — ABNORMAL HIGH (ref 0.61–1.24)
GFR, Estimated: 20 mL/min — ABNORMAL LOW (ref >60)
Glucose, Bld: 390 mg/dL — ABNORMAL HIGH (ref 70–99)
Potassium: 5.1 mmol/L (ref 3.5–5.1)
Sodium: 138 mmol/L (ref 135–145)
Total Bilirubin: 0.2 mg/dL (ref 0.0–1.2)
Total Protein: 6.4 g/dL — ABNORMAL LOW (ref 6.5–8.1)

## 2023-11-09 LAB — BLOOD GAS, VENOUS
Acid-base deficit: 1.4 mmol/L (ref 0.0–2.0)
Bicarbonate: 26 mmol/L (ref 20.0–28.0)
O2 Saturation: 76.8 %
Patient temperature: 37
pCO2, Ven: 54 mmHg (ref 44–60)
pH, Ven: 7.29 (ref 7.25–7.43)
pO2, Ven: 46 mmHg — ABNORMAL HIGH (ref 32–45)

## 2023-11-09 LAB — I-STAT CHEM 8, ED
BUN: 77 mg/dL — ABNORMAL HIGH (ref 8–23)
Calcium, Ion: 1.18 mmol/L (ref 1.15–1.40)
Chloride: 103 mmol/L (ref 98–111)
Creatinine, Ser: 3.4 mg/dL — ABNORMAL HIGH (ref 0.61–1.24)
Glucose, Bld: 403 mg/dL — ABNORMAL HIGH (ref 70–99)
HCT: 33 % — ABNORMAL LOW (ref 39.0–52.0)
Hemoglobin: 11.2 g/dL — ABNORMAL LOW (ref 13.0–17.0)
Potassium: 5 mmol/L (ref 3.5–5.1)
Sodium: 138 mmol/L (ref 135–145)
TCO2: 24 mmol/L (ref 22–32)

## 2023-11-09 LAB — CBG MONITORING, ED
Glucose-Capillary: 292 mg/dL — ABNORMAL HIGH (ref 70–99)
Glucose-Capillary: 295 mg/dL — ABNORMAL HIGH (ref 70–99)

## 2023-11-09 LAB — BETA-HYDROXYBUTYRIC ACID: Beta-Hydroxybutyric Acid: 0.25 mmol/L (ref 0.05–0.27)

## 2023-11-09 MED ORDER — INSULIN ASPART 100 UNIT/ML IJ SOLN
4.0000 [IU] | Freq: Once | INTRAMUSCULAR | Status: AC
  Administered 2023-11-09: 3 [IU] via SUBCUTANEOUS
  Filled 2023-11-09: qty 0.04

## 2023-11-09 MED ORDER — SODIUM CHLORIDE 0.9 % IV BOLUS
500.0000 mL | Freq: Once | INTRAVENOUS | Status: AC
  Administered 2023-11-09: 500 mL via INTRAVENOUS

## 2023-11-09 NOTE — ED Notes (Signed)
BS 292

## 2023-11-09 NOTE — ED Provider Notes (Signed)
 [place] EMERGENCY DEPARTMENT AT [facility] Provider Note   CSN: [id number] Arrival date & time: 11/08/23  2327     Patient presents with: Hyperglycemia   Brian [name] is a 89 y.o. male.   The history is provided by the patient, a relative and medical records.  Hyperglycemia Brian [name] is a 89 y.o. male who presents to the Emergency Department complaining of elevated blood sugar. He presents the emergency department for evaluation of elevated blood sugars that have been ongoing for some time but have been up trending today. He was started on guardians about a week ago and today took 10 units of glargine. This evening his blood sugars increased after taking the insulin  and read to hide to check. Daughter was concerned that his urine smelled fruity. He denies any somatic complaints. In particular no fever, chest pain, difficulty breathing, nausea, vomiting, dysuria, wounds. He was recently admitted for G.I. bleed DVT AVM and has started octreotide . He has been on prednisone  for two years.    Prior to Admission medications   Medication Sig Start Date End Date Taking? Authorizing Provider  albuterol  (VENTOLIN  HFA) 108 (90 Base) MCG/ACT inhaler Inhale 2 puffs into the lungs every 6 (six) hours as needed for wheezing or shortness of breath. 10/18/23   Geofm Glade PARAS, MD  ANORO ELLIPTA  62.5-25 MCG/ACT AEPB USE 1 INHALATION DAILY 09/30/23   Geofm Glade PARAS, MD  Carboxymethylcellulose Sodium 0.25 % SOLN Place 1 drop into both eyes every 4 (four) hours.    [provider]  Cholecalciferol  50 MCG (2000 UT) TABS Take 2,000 Units by mouth in the morning.    [provider]  cyanocobalamin  (VITAMIN B12) 500 MCG tablet Take 1,000 mcg by mouth daily.    [provider]  empagliflozin  (JARDIANCE ) 10 MG TABS tablet Take 1 tablet (10 mg total) by mouth daily before breakfast. 10/30/23   Jillian Buttery, MD  escitalopram  (LEXAPRO ) 20 MG tablet Take 20 mg by mouth  in the morning.    [provider]  ferrous sulfate  325 (65 FE) MG tablet Take 1 tablet (325 mg total) by mouth daily with breakfast. 09/28/22   Burns, Glade PARAS, MD  furosemide  (LASIX ) 40 MG tablet Take 1 tablet (40 mg total) by mouth daily. 09/03/23   Rosario Leatrice FERNS, MD  insulin  glargine (LANTUS ) 100 UNIT/ML Solostar Pen Inject 10 Units into the skin daily. 11/06/23   Geofm Glade PARAS, MD  Insulin  Pen Needle 32G X 6 MM MISC UAD for daily lantus  injections 11/05/23   Burns, Glade PARAS, MD  memantine  (NAMENDA ) 10 MG tablet Take 5 mg by mouth in the morning and at bedtime.    [provider]  mirtazapine  (REMERON ) 15 MG tablet Take 0.5 tablets (7.5 mg total) by mouth at bedtime. 10/07/23   Geofm Glade PARAS, MD  octreotide  (SANDOSTATIN  LAR) 20 MG injection Inject 20 mg into the muscle every 30 (thirty) days. 10/31/23   Adhikari, Amrit, MD  OXYGEN  Inhale 6 L/min into the lungs continuous. Patient taking differently: Inhale 4 L/min into the lungs continuous. 11/07/23: Reports during TOC call currently using 4 L/min    [provider]  pantoprazole  (PROTONIX ) 40 MG tablet Take 1 tablet (40 mg total) by mouth daily. 10/31/23   Jillian Buttery, MD  predniSONE  (DELTASONE ) 10 MG tablet Take 1 tablet (10 mg total) by mouth daily with breakfast. Take 1.5 tablets (15mg  total) daily with breakfast 10/30/23   Jillian Buttery, MD  simvastatin  (  ZOCOR ) 40 MG tablet Take 1 tablet (40 mg total) by mouth daily. 02/27/23   Geofm Glade PARAS, MD  sulfamethoxazole -trimethoprim  (BACTRIM  DS) 800-160 MG tablet Take 1 tablet by mouth 3 (three) times a week. 10/18/23   Shelah Lamar RAMAN, MD    Allergies: Meperidine hcl, Sertraline , Gabapentin , Memantine , Promethazine, Latex, and Zolpidem tartrate    Review of Systems  All other systems reviewed and are negative.   Updated Vital Signs BP (!) 150/79   Pulse 68   Temp 98.8 F (37.1 C) (Oral)   Resp 15   SpO2 100%   Physical Exam Vitals and nursing note  reviewed.  Constitutional:      Appearance: He is well-developed.  HENT:     Head: Normocephalic and atraumatic.  Cardiovascular:     Rate and Rhythm: Normal rate and regular rhythm.     Heart sounds: No murmur heard. Pulmonary:     Effort: Pulmonary effort is normal. No respiratory distress.     Breath sounds: Normal breath sounds.  Abdominal:     Palpations: Abdomen is soft.     Tenderness: There is no abdominal tenderness. There is no guarding or rebound.  Musculoskeletal:        General: No tenderness.     Comments: 2+ DP pulses bilaterally. No wound to the feet   Skin:    General: Skin is warm and dry.  Neurological:     Mental Status: He is alert and oriented to person, place, and time.  Psychiatric:        Behavior: Behavior normal.     (all labs ordered are listed, but only abnormal results are displayed) Labs Reviewed  URINALYSIS, ROUTINE W REFLEX MICROSCOPIC - Abnormal; Notable for the following components:      Result Value   Color, Urine STRAW (*)    Glucose, UA >=500 (*)    All other components within normal limits  COMPREHENSIVE METABOLIC PANEL WITH GFR - Abnormal; Notable for the following components:   Glucose, Bld 390 (*)    BUN 77 (*)    Creatinine, Ser 2.90 (*)    Total Protein 6.4 (*)    GFR, Estimated 20 (*)    All other components within normal limits  BLOOD GAS, VENOUS - Abnormal; Notable for the following components:   pO2, Ven 46 (*)    All other components within normal limits  CBG MONITORING, ED - Abnormal; Notable for the following components:   Glucose-Capillary 380 (*)    All other components within normal limits  CBG MONITORING, ED - Abnormal; Notable for the following components:   Glucose-Capillary 292 (*)    All other components within normal limits  I-STAT CHEM 8, ED - Abnormal; Notable for the following components:   BUN 77 (*)    Creatinine, Ser 3.40 (*)    Glucose, Bld 403 (*)    Hemoglobin 11.2 (*)    HCT 33.0 (*)    All  other components within normal limits  CBG MONITORING, ED - Abnormal; Notable for the following components:   Glucose-Capillary 295 (*)    All other components within normal limits  BETA-HYDROXYBUTYRIC ACID  CBC  CBG MONITORING, ED    EKG: None  Radiology: No results found.   Procedures   Medications Ordered in the ED  sodium chloride  0.9 % bolus 500 mL (0 mLs Intravenous Stopped 11/09/23 0444)  insulin  aspart (novoLOG ) injection 4 Units (3 Units Subcutaneous Given 11/09/23 0221)  Medical Decision Making Amount and/or Complexity of Data Reviewed Labs: ordered.  Risk Prescription drug management.   Patient with CKD, chronic prednisone  use, history of G.I. bleed due to AVM here for evaluation of persistently elevated blood sugars despite starting insulin . He has no systemic complaints. Renal function is slightly worse compared to recent hospital discharge with creatinine up to 2.9. Blood sugar is elevated to the 300s. No evidence of DKA. No evidence of acute infectious process. He was treated with IV fluids with improvement in his blood sugar as well as subcu insulin . Feel he is stable for discharge home. Discussed continued his current insulin  regimen. It would hold his Lasix  the next two days given his kidney function. Discussed slow titration of his insulin . Discussed return precautions if he does have systemic or new concerns.     Final diagnoses:  Hyperglycemia    ED Discharge Orders     None          Griselda Norris, MD 11/09/23 [phone]

## 2023-11-09 NOTE — ED Notes (Signed)
 Patient d/c with home care instructions. IV discontinued. Patient assisted out of bed into wheel chair and out to car with daughter driving. Patient ok to be d/c'd without BS check at this time per provider rees.

## 2023-11-11 ENCOUNTER — Other Ambulatory Visit: Payer: Self-pay | Admitting: Internal Medicine

## 2023-11-11 ENCOUNTER — Other Ambulatory Visit: Payer: Self-pay

## 2023-11-11 ENCOUNTER — Encounter: Payer: Self-pay | Admitting: Internal Medicine

## 2023-11-11 MED ORDER — EMPAGLIFLOZIN 10 MG PO TABS: 10.0000 mg | ORAL_TABLET | Freq: Every day | ORAL | 0 refills | Status: DC

## 2023-11-11 MED ORDER — FUROSEMIDE 40 MG PO TABS: 40.0000 mg | ORAL_TABLET | Freq: Every day | ORAL | 0 refills | Status: DC

## 2023-11-11 NOTE — Patient Instructions (Incomplete)
   Medications changes include :   fluoxetine 20 mg daily,  xanax 0.5 mg twice daily as needed for panic attack.  Buspar 5 mg at night as needed for sleep.

## 2023-11-11 NOTE — Telephone Encounter (Signed)
 Copied from CRM [id number]. Topic: Clinical - Medication Refill >> Nov 11, 2023  1:12 PM Fredrica W wrote: Medication: empagliflozin  (JARDIANCE ) 10 MG TABS tablet - originally prescribed at hospital  Has the patient contacted their pharmacy? Yes - pharmacy called to request (Agent: If no, request that the patient contact the pharmacy for the refill. If patient does not wish to contact the pharmacy document the reason why and proceed with request.) (Agent: If yes, when and what did the pharmacy advise?)  This is the patient's preferred pharmacy:  [facility]  Phone: [phone] Fax: [phone]    Is this the correct pharmacy for this prescription? No If no, delete pharmacy and type the correct one.   Has the prescription been filled recently? Yes  Is the patient out of the medication? N/A  Has the patient been seen for an appointment in the last year OR does the patient have an upcoming appointment? Yes  Can we respond through MyChart? No  Agent: Please be advised that Rx refills may take up to 3 business days. We ask that you follow-up with your pharmacy.

## 2023-11-11 NOTE — Progress Notes (Unsigned)
 Subjective:    Patient ID: Brian [name], male    DOB: 1931-06-17, 89 y.o.   MRN: [id number]     HPI Brian [name] is here for follow up of his chronic medical problems.  ED 9/19 with sugars in 300's.  No evidence of DKA.    Medications and allergies reviewed with patient and updated if appropriate.  Current Outpatient Medications on File Prior to Visit  Medication Sig Dispense Refill   albuterol  (VENTOLIN  HFA) 108 (90 Base) MCG/ACT inhaler Inhale 2 puffs into the lungs every 6 (six) hours as needed for wheezing or shortness of breath. 18 g 4   ANORO ELLIPTA  62.5-25 MCG/ACT AEPB USE 1 INHALATION DAILY 180 each 3   Carboxymethylcellulose Sodium 0.25 % SOLN Place 1 drop into both eyes every 4 (four) hours.     Cholecalciferol  50 MCG (2000 UT) TABS Take 2,000 Units by mouth in the morning.     cyanocobalamin  (VITAMIN B12) 500 MCG tablet Take 1,000 mcg by mouth daily.     empagliflozin  (JARDIANCE ) 10 MG TABS tablet Take 1 tablet (10 mg total) by mouth daily before breakfast. 30 tablet 0   escitalopram  (LEXAPRO ) 20 MG tablet Take 20 mg by mouth in the morning.     ferrous sulfate  325 (65 FE) MG tablet Take 1 tablet (325 mg total) by mouth daily with breakfast. 90 tablet 1   furosemide  (LASIX ) 40 MG tablet Take 1 tablet (40 mg total) by mouth daily. 30 tablet 0   insulin  glargine (LANTUS ) 100 UNIT/ML Solostar Pen Inject 10 Units into the skin daily. 15 mL 11   Insulin  Pen Needle 32G X 6 MM MISC UAD for daily lantus  injections 90 each 3   memantine  (NAMENDA ) 10 MG tablet Take 5 mg by mouth in the morning and at bedtime.     mirtazapine  (REMERON ) 15 MG tablet Take 0.5 tablets (7.5 mg total) by mouth at bedtime.     octreotide  (SANDOSTATIN  LAR) 20 MG injection Inject 20 mg into the muscle every 30 (thirty) days. 1 each 0   OXYGEN  Inhale 6 L/min into the lungs continuous. (Patient taking differently: Inhale 4 L/min into the lungs continuous. 11/07/23: Reports during TOC call currently using 4  L/min)     pantoprazole  (PROTONIX ) 40 MG tablet Take 1 tablet (40 mg total) by mouth daily. 30 tablet 0   predniSONE  (DELTASONE ) 10 MG tablet Take 1 tablet (10 mg total) by mouth daily with breakfast. Take 1.5 tablets (15mg  total) daily with breakfast     simvastatin  (ZOCOR ) 40 MG tablet Take 1 tablet (40 mg total) by mouth daily. 90 tablet 3   sulfamethoxazole -trimethoprim  (BACTRIM  DS) 800-160 MG tablet Take 1 tablet by mouth 3 (three) times a week. 12 tablet 5   No current facility-administered medications on file prior to visit.     Review of Systems     Objective:  There were no vitals filed for this visit. BP Readings from Last 3 Encounters:  11/08/23 (!) 150/79  11/05/23 130/60  10/30/23 (!) 135/59   Wt Readings from Last 3 Encounters:  10/26/23 154 lb 1.6 oz (69.9 kg)  10/10/23 158 lb (71.7 kg)  10/03/23 149 lb 12.8 oz (67.9 kg)   There is no height or weight on file to calculate BMI.    Physical Exam     Lab Results  Component Value Date   WBC 7.3 10/30/2023   HGB 11.2 (L) 11/09/2023   HCT 33.0 (L) 11/09/2023  PLT 133 (L) 10/30/2023   GLUCOSE 390 (H) 11/09/2023   CHOL 163 04/01/2023   TRIG 134.0 04/01/2023   HDL 68.20 04/01/2023   LDLCALC 68 04/01/2023   ALT 24 11/09/2023   AST 18 11/09/2023   NA 138 11/09/2023   K 5.1 11/09/2023   CL 101 11/09/2023   CREATININE 2.90 (H) 11/09/2023   BUN 77 (H) 11/09/2023   CO2 23 11/09/2023   TSH 1.24 09/28/2022   PSA 1.42 07/16/2007   INR 1.2 01/21/2019   HGBA1C 8.2 (H) 10/29/2023   MICROALBUR 9.45 03/19/2018     Assessment & Plan:    See Problem List for Assessment and Plan of chronic medical problems.

## 2023-11-12 ENCOUNTER — Other Ambulatory Visit: Payer: Self-pay | Admitting: Radiation Oncology

## 2023-11-12 ENCOUNTER — Ambulatory Visit (INDEPENDENT_AMBULATORY_CARE_PROVIDER_SITE_OTHER): Admitting: Internal Medicine

## 2023-11-12 ENCOUNTER — Telehealth: Payer: Self-pay

## 2023-11-12 ENCOUNTER — Encounter: Payer: Self-pay | Admitting: Internal Medicine

## 2023-11-12 VITALS — BP 130/70 | HR 78 | Temp 98.1°F | Ht 71.0 in

## 2023-11-12 DIAGNOSIS — E1122 Type 2 diabetes mellitus with diabetic chronic kidney disease: Secondary | ICD-10-CM | POA: Diagnosis not present

## 2023-11-12 DIAGNOSIS — N184 Chronic kidney disease, stage 4 (severe): Secondary | ICD-10-CM | POA: Diagnosis not present

## 2023-11-12 DIAGNOSIS — Z794 Long term (current) use of insulin: Secondary | ICD-10-CM

## 2023-11-12 MED ORDER — LORAZEPAM 0.5 MG PO TABS: 0.5000 mg | ORAL_TABLET | ORAL | 0 refills | Status: AC

## 2023-11-12 MED ORDER — INSULIN PEN NEEDLE 32G X 6 MM MISC: 3 refills | Status: DC

## 2023-11-12 MED ORDER — DEXCOM G7 RECEIVER DEVI: 0 refills | Status: DC

## 2023-11-12 MED ORDER — DEXCOM G7 SENSOR MISC: 3 refills | Status: DC

## 2023-11-12 MED ORDER — INSULIN LISPRO 100 UNIT/ML IJ SOLN: INTRAMUSCULAR | 11 refills | Status: DC

## 2023-11-12 MED ORDER — INSULIN GLARGINE 100 UNIT/ML SOLOSTAR PEN: 14.0000 [IU] | PEN_INJECTOR | Freq: Every day | SUBCUTANEOUS | Status: DC

## 2023-11-12 NOTE — Assessment & Plan Note (Signed)
 Chronic With CKD stage 4  Lab Results  Component Value Date   HGBA1C 8.2 (H) 10/29/2023   Sugars not controlled - related to prednisone  Continue jardiance  10 mg daily Sugars at home in 200's  Increase lantus  14 units daily  Start humalog  2-4 units depending on sugar prior to meals Dexcom monitor

## 2023-11-12 NOTE — Telephone Encounter (Signed)
 Received a call from patients daughter Cruz requesting a prescription for Lorazepam  0.5 mg be sent to [facility] on [address] prior to patient's CT scan. Patient's scan is scheduled for 11/15/23.

## 2023-11-13 ENCOUNTER — Encounter: Payer: Self-pay | Admitting: Internal Medicine

## 2023-11-14 ENCOUNTER — Telehealth: Payer: Self-pay

## 2023-11-14 ENCOUNTER — Other Ambulatory Visit: Payer: Self-pay | Admitting: *Deleted

## 2023-11-14 NOTE — Telephone Encounter (Signed)
 Copied from CRM #[id number]. Topic: Clinical - Medication Question >> Nov 14, 2023  4:32 PM Brian [name] wrote: Reason for CRM: Pt daughter, Brian [name] , wants to know if there is reason the vial of Humalog  was sent in rather than the pen. She would rather have the pen, but is able to do the vial if necessary. Pls call [phone] or message on her MyChart.

## 2023-11-14 NOTE — Transitions of Care (Post Inpatient/ED Visit) (Signed)
 Transition of Care week 3/ day # 13  Visit Note  11/14/2023  Name: Brian [name] MRN: [id number]          DOB: Nov 14, 1931  Situation: Patient enrolled in [facility] 30-day program. Visit completed with [facility]- caregiver/ daughter: verified on [facility] DPR by telephone.   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 (2) unplanned hospital admission x last (12) and (6) months  Baseline vascular dementia:  Resides with caregiver/ daughter who manages all aspects of patient's daily care/ healthcare needs  Initial Transition Care Management Follow-up Telephone Call    Past Medical History:  Diagnosis Date   AAA (abdominal aortic aneurysm)    Arthritis    Asthma 1976   Bronchiolitis 06/2014   CAD (coronary artery disease)    NSTEMI 02/2010;  s/p CABG 1/12 (L-LAD, SOM1, S-PDA); echo in 08/2010: Mild LVH, EF 55%, grade 2 diastolic dysfunction, mild MR, mild LAE, mildly reduced RV function, mild RAE, PASP 42   Carpal tunnel syndrome of left wrist    Cecal angiodysplasia 04/04/2022   Colon polyps    COPD (chronic obstructive pulmonary disease) (HCC)    Diabetes mellitus 2000   Type II, controlls with diet   Dyspnea    ED (erectile dysfunction)    Gout    History of radiation therapy 03/09/2020-03/16/2020   SBRT right lung: Dr. Lynwood Nasuti   HTN (hypertension)    Hyperlipidemia    Inguinal hernia    Right   Macular degeneration    Myocardial infarction (HCC) 1 -13-2012   Neuropathy    Assessment:  Things are going well now- but we took him to the ER the other day because his blood sugars are staying high on this new ocreotide, which he needs for the GI bleeding; took him to Dr. Geofm after the ER visit, she adjusted the long acting insulin  up and has told us  to start using the short-acting insulin  too--- waiting on the outpatient pharmacy to call about the short-acting insulin , will  start giving it as soon as we get it.  Blood sugars are much better just using the higher dose of the long acting.  [facility]-- otherwise, everything is the same and he is doing well    Denies clinical concerns and sounds to be in no distress throughout [facility] 30-day program outreach call today  Patient Reported Symptoms: Cognitive Cognitive Status: Normal speech and language skills, Alert and oriented to person, place, and time, Poor judgment in daily scenarios, Struggling with memory recall (per caregiver report)      Neurological Neurological Review of Symptoms: No symptoms reported (per caregiver report) Oher Neurological Symptoms/Conditions [RPT]: per caregiver report: baseline vascular dementia Neurological Management Strategies: Routine screening, Coping strategies, Adequate rest  HEENT HEENT Symptoms Reported: No symptoms reported (per caregiver report)      Cardiovascular Cardiovascular Symptoms Reported: No symptoms reported (per caregiver report) Does patient have uncontrolled Hypertension?: No Cardiovascular Management Strategies: Routine screening, Coping strategies, Medication therapy, Adequate rest  Respiratory Respiratory Symptoms Reported: No symptoms reported (per caregiver report) Other Respiratory Symptoms: Reports breathing fine; using home O2 as his normal at 4- 5 L/min; no shortness of breath outside of his normal, whenever he is active- he recovers quickly as soon as he rests Confirmed/ reinforced action plan for shortness of breath: verbalizes excellent ongoing understanding/ adherence to same Respiratory Management Strategies: Oxygen  therapy, Routine  screening, Coping strategies, Asthma action plan, Adequate rest, Medication therapy  Endocrine Endocrine Symptoms Reported: No symptoms reported (per caregiver report) Is patient diabetic?: Yes Is patient checking blood sugars at home?: Yes List most recent blood sugar readings, include date and time  of day: Continues to report increased blood sugars post- recent hospital discharge: the doctors tell us  this is from both the new ocreotide and the prednisone ; Reports ED visit 11/08/23 and PCP visit 11/12/23: Reviewed new instructions for insulin  with caregiver who reports very good understanding of same with minimal prompting: she is currently awaiting both Humalog  to be filled and is waiting on CGM from [place]- has spoken with both outpatient's phamacies to obtain: Fasting blood sugar this morning: 108-- reports blood sugars trending down after new dosing instructions in place    Gastrointestinal Gastrointestinal Symptoms Reported: No symptoms reported (per caregiver report) Additional Gastrointestinal Details: Eating good, no signs/ symptoms bleeding, having normal and regular BM's Gastrointestinal Management Strategies: Coping strategies, Medication therapy    Genitourinary Genitourinary Symptoms Reported: No symptoms reported (per caregiver report)    Integumentary Integumentary Symptoms Reported: No symptoms reported (per caregiver report)    Musculoskeletal Musculoskelatal Symptoms Reviewed: Limited mobility (per caregiver report) Additional Musculoskeletal Details: confirmed uses assistive devices on regular basis, at baseline -- walker; caregiver continues to supervise all ambulation; denies new/ recent falls Musculoskeletal Management Strategies: Coping strategies, Medical device, Routine screening      Psychosocial Psychosocial Symptoms Reported: No symptoms reported (per caregiver report) Behavioral Management Strategies: Support system, Coping strategies Major Change/Loss/Stressor/Fears (CP): Denies (per caregiver report) Techniques to Cope with Loss/Stress/Change: Diversional activities Quality of Family Relationships: supportive, involved, helpful   There were no vitals filed for this visit.  Medications Reviewed Today     Reviewed by Zeena Starkel M, RN (Registered Nurse) on  11/14/23 at 1408  Med List Status: <None>   Medication Order Taking? Sig Documenting Provider Last Dose Status Informant  albuterol  (VENTOLIN  HFA) 108 (90 Base) MCG/ACT inhaler [id number] Yes Inhale 2 puffs into the lungs every 6 (six) hours as needed for wheezing or shortness of breath. Geofm Glade PARAS, MD  Active Family Member  ANORO ELLIPTA  62.5-25 MCG/ACT AEPB [id number] Yes USE 1 INHALATION DAILY Geofm Glade PARAS, MD  Active Family Member  Carboxymethylcellulose Sodium 0.25 % SOLN [id number] Yes Place 1 drop into both eyes every 4 (four) hours. [provider]  Active Family Member  Cholecalciferol  50 MCG (2000 UT) TABS [id number] Yes Take 2,000 Units by mouth in the morning. [provider]  Active Family Member  Continuous Glucose Receiver [facility] G7 RECEIVER) DEVI [id number]  UAD to monitor sugars  E11.22, N18.4 Geofm Glade PARAS, MD  Active   Continuous Glucose Sensor (DEXCOM G7 SENSOR) MISC [id number]  UAD to monitor sugars - IDDM, E11.22, N18.4 Geofm Glade PARAS, MD  Active   cyanocobalamin  (VITAMIN B12) 500 MCG tablet [id number] Yes Take 1,000 mcg by mouth daily. [provider]  Active Family Member  empagliflozin  (JARDIANCE ) 10 MG TABS tablet [id number] Yes Take 1 tablet (10 mg total) by mouth daily before breakfast. Geofm Glade PARAS, MD  Active   escitalopram  (LEXAPRO ) 20 MG tablet [id number] Yes Take 20 mg by mouth in the morning. [provider]  Active Family Member  ferrous sulfate  325 (65 FE) MG tablet [id number] Yes Take 1 tablet (325 mg total) by mouth daily with breakfast. Geofm Glade PARAS, MD  Active Family Member  furosemide  (LASIX ) 40 MG tablet [id number] Yes Take 1 tablet (40  mg total) by mouth daily. Verlin Lonni BIRCH, MD  Active   insulin  glargine (LANTUS ) 100 UNIT/ML Solostar Pen [id number] Yes Inject 14 Units into the skin daily. Geofm Glade PARAS, MD  Active   insulin  lispro (HUMALOG ) 100 UNIT/ML injection [id number] Yes Inject 2-4 units into the skin 3  times a day before meals.  < 150  0 units, 151-200  2 units,  201-250  3 units, >250    4 units Burns, Glade PARAS, MD  Active   Insulin  Pen Needle 32G X 6 MM MISC [id number] Yes UAD for daily lantus  injections and humalog  injections tid prn Geofm Glade PARAS, MD  Active   memantine  (NAMENDA ) 10 MG tablet [id number] Yes Take 5 mg by mouth in the morning and at bedtime. [provider]  Active Family Member  mirtazapine  (REMERON ) 15 MG tablet [id number] Yes Take 0.5 tablets (7.5 mg total) by mouth at bedtime. Geofm Glade PARAS, MD  Active Family Member  octreotide  (SANDOSTATIN  LAR) 20 MG injection [id number] Yes Inject 20 mg into the muscle every 30 (thirty) days. Jillian Buttery, MD  Active   OXYGEN  [id number] Yes Inhale 6 L/min into the lungs continuous. [provider]  Active Family Member  pantoprazole  (PROTONIX ) 40 MG tablet [id number] Yes Take 1 tablet (40 mg total) by mouth daily. Jillian Buttery, MD  Active   predniSONE  (DELTASONE ) 10 MG tablet [id number] Yes Take 1 tablet (10 mg total) by mouth daily with breakfast. Take 1.5 tablets (15mg  total) daily with breakfast Jillian Buttery, MD  Active   simvastatin  (ZOCOR ) 40 MG tablet [id number] Yes Take 1 tablet (40 mg total) by mouth daily. Geofm Glade PARAS, MD  Active Family Member  sulfamethoxazole -trimethoprim  (BACTRIM  DS) 800-160 MG tablet [id number] Yes Take 1 tablet by mouth 3 (three) times a week. Shelah Lamar RAMAN, MD  Active Family Member  Med List Note Leobardo Garre, CPhT 08/28/23 0130): PATIENT HAS DIFFICULTY SWALLOWING LARGE PILLS; PLEASE BREAK IN HALF. All local 30 day or less are filled at [facility] on [address].           Recommendation:   Specialty provider follow-up: routine Chest CT 11/15/23; oncology radiation provider 11/18/23- to discuss CT results Continue Current Plan of Care  Follow Up Plan:   Telephone follow-up in 1 week- as scheduled 11/21/23  Pls call/ message for questions,  Mirelle Biskup Mckinney Emonte Dieujuste, RN, BSN, CCRN  Alumnus RN Care Manager  Transitions of Care  [facility] - [facility] [phone]: direct office

## 2023-11-14 NOTE — Patient Instructions (Signed)
 Visit Information  Thank you for taking time to visit with me today. Please don't hesitate to contact me if I can be of assistance to you before our next scheduled telephone appointment.  Our next appointment is by telephone on Thursday, November 21, 2023 at 1:30 pm  Please call the care guide team at [phone] if you need to cancel or reschedule your appointment.   Following are the goals we discussed today:  Patient Self Care Activities:  Attend all scheduled provider appointments Call provider office for new concerns or questions  Participate in Transition of Care Program/Attend TOC scheduled calls Take medications as prescribed   Report any signs/ symptoms GI bleeding ( as we have discussed during our phone calls) immediately to your provider, seek emergency assistance as needed Everyday tips for care of patient with dementia- Keep a routine, such as bathing dressing eating at the same time each day, plan activities that the person enjoys and try to do them at the same time each day, serve meals in a consistent, familiar place and give patient enough time to eat, encourage use of loose-fitting, comfortable, easy to use clothing such as, elastic waistbands, fabric fasteners, or large zipper pulls instead of shoelaces, buttons, or buckles Continue pacing activity as your recuperation from recent hospital visit continues Continue monitoring and recording your blood sugars at home and using the new insulin  as prescribed Continue using home oxygen  as prescribed Use assistive devices as needed to prevent falls If you believe your condition is getting worse- contact your care providers (doctors) promptly- reaching out to your doctor early when you have concerns can prevent you from having to go to the hospital  If you are experiencing a Mental Health or Behavioral Health Crisis or need someone to talk to, please  call the Suicide and Crisis Lifeline: 988 call the [place]  National Suicide Prevention  Lifeline: (276)711-2984 or TTY: 805-742-3816 TTY (915)175-5609) to talk to a trained counselor call 1-800-273-TALK (toll free, 24 hour hotline) go to [facility] [address] [place] [phone]) call the [facility] Crisis Line: (415)748-6366 call 911   Caregiver verbalizes understanding of instructions and care plan provided today and agrees to view in MyChart. Active MyChart status and patient understanding of how to access instructions and care plan via MyChart confirmed with caregiver     Beatris Blinda Lawrence, RN, BSN, CCRN Alumnus RN Care Manager  Transitions of Care  [facility] - [facility] [phone]: direct office

## 2023-11-15 ENCOUNTER — Other Ambulatory Visit (HOSPITAL_COMMUNITY): Payer: Self-pay

## 2023-11-15 ENCOUNTER — Telehealth: Payer: Self-pay

## 2023-11-15 ENCOUNTER — Ambulatory Visit (HOSPITAL_COMMUNITY)
Admission: RE | Admit: 2023-11-15 | Discharge: 2023-11-15 | Disposition: A | Discharge status: Home / Self Care | Source: Ambulatory Visit | Attending: Radiology | Admitting: Radiology

## 2023-11-15 DIAGNOSIS — R911 Solitary pulmonary nodule: Secondary | ICD-10-CM | POA: Diagnosis present

## 2023-11-15 NOTE — Progress Notes (Signed)
 Radiation Oncology         [phone] ________________________________  Name: Brian [name] MRN: [id number]  Date: 11/18/2023  DOB: 01/27/1932  Follow-Up Visit Note  CC: Geofm Glade PARAS, MD  Shelah Lamar RAMAN, MD  No diagnosis found.  Diagnosis: Pet positive Right upper lobe lung nodule, suspicious for primary bronchogenic carcinoma   Interval Since Last Radiation: 3 years, 8 months, and 3 days   Radiation Treatment Dates: 03/09/2020 through 03/16/2020   Site: Right lung Technique: SBRT Total Dose (Gy): 54/54 Dose per Fx (Gy): 18 Completed Fx: 3/3 Beam Energies: 6XFFF  Narrative:  The patient returns today for routine follow-up and to review recent imaging. He was last seen here for follow-up on 05/02/23.      Since his last visit, he has continued to follow with Dr. Hope at [facility] for management of COPD and in the setting of his history of pulmonary nodules.   He was also hospitalized from 08/27/23 through 09/02/23 for management of a GI bleed. After being discharged, he followed up with Dr. Hope on 10/10/23 and reported a decrease in supplemental O2 requirements from 6 down to 4 L after his hospitalization.  He also endorsed improved breathing, less snoring, and having better energy levels since his hospitalization. Given his overall improvement in respiratory status (and in the setting of his stage 4 CKD), Dr. Hope recommended reducing his prednisone  dose and started him on a prednisone  taper. Otherwise, he was advised to continue with his inhaler regimen (consisting of an Anoro inhaler). He was also cleared to use his supplemental O2 at 4L.           Of note: He did have a CT CAP performed while hospitalized on 08/28/23 which demonstrated: stability of the small right pleural effusion with compressive atelectasis in the right lower lobe, and stability of the scattered small pulmonary nodules. Imaging otherwise showed no new or enlarging pulmonary nodules.                 He was unfortunately hospitalized again from 10/25/23 through 10/30/23 with a recurrent GI bleed associated with anemia. Hospital course consisted of iron  supplementation, octreotide , and transfusions. He was discharged home in stable condition and advised to continue on once monthly octreotide  in an OP setting.   His most recent chest CT performed on 11/15/23 demonstrates: ***  Allergies:  is allergic to meperidine hcl, sertraline , gabapentin , memantine , promethazine, latex, and zolpidem tartrate.  Meds: Current Outpatient Medications  Medication Sig Dispense Refill   albuterol  (VENTOLIN  HFA) 108 (90 Base) MCG/ACT inhaler Inhale 2 puffs into the lungs every 6 (six) hours as needed for wheezing or shortness of breath. 18 g 4   ANORO ELLIPTA  62.5-25 MCG/ACT AEPB USE 1 INHALATION DAILY 180 each 3   Carboxymethylcellulose Sodium 0.25 % SOLN Place 1 drop into both eyes every 4 (four) hours.     Cholecalciferol  50 MCG (2000 UT) TABS Take 2,000 Units by mouth in the morning.     Continuous Glucose Receiver (DEXCOM G7 RECEIVER) DEVI UAD to monitor sugars  E11.22, N18.4 1 each 0   Continuous Glucose Sensor (DEXCOM G7 SENSOR) MISC UAD to monitor sugars - IDDM, E11.22, N18.4 9 each 3   cyanocobalamin  (VITAMIN B12) 500 MCG tablet Take 1,000 mcg by mouth daily.     empagliflozin  (JARDIANCE ) 10 MG TABS tablet Take 1 tablet (10 mg total) by mouth daily before breakfast. 30 tablet 0   escitalopram  (LEXAPRO ) 20 MG tablet Take 20  mg by mouth in the morning.     ferrous sulfate  325 (65 FE) MG tablet Take 1 tablet (325 mg total) by mouth daily with breakfast. 90 tablet 1   furosemide  (LASIX ) 40 MG tablet Take 1 tablet (40 mg total) by mouth daily. 30 tablet 0   insulin  glargine (LANTUS ) 100 UNIT/ML Solostar Pen Inject 14 Units into the skin daily.     insulin  lispro (HUMALOG ) 100 UNIT/ML injection Inject 2-4 units into the skin 3 times a day before meals.  < 150  0 units, 151-200  2 units,  201-250  3 units,  >250    4 units 10 mL 11   Insulin  Pen Needle 32G X 6 MM MISC UAD for daily lantus  injections and humalog  injections tid prn 120 each 3   memantine  (NAMENDA ) 10 MG tablet Take 5 mg by mouth in the morning and at bedtime.     mirtazapine  (REMERON ) 15 MG tablet Take 0.5 tablets (7.5 mg total) by mouth at bedtime.     octreotide  (SANDOSTATIN  LAR) 20 MG injection Inject 20 mg into the muscle every 30 (thirty) days. 1 each 0   OXYGEN  Inhale 6 L/min into the lungs continuous.     pantoprazole  (PROTONIX ) 40 MG tablet Take 1 tablet (40 mg total) by mouth daily. 30 tablet 0   predniSONE  (DELTASONE ) 10 MG tablet Take 1 tablet (10 mg total) by mouth daily with breakfast. Take 1.5 tablets (15mg  total) daily with breakfast     simvastatin  (ZOCOR ) 40 MG tablet Take 1 tablet (40 mg total) by mouth daily. 90 tablet 3   sulfamethoxazole -trimethoprim  (BACTRIM  DS) 800-160 MG tablet Take 1 tablet by mouth 3 (three) times a week. 12 tablet 5   No current facility-administered medications for this encounter.    Physical Findings: The patient is in no acute distress. Patient is alert and oriented.  vitals were not taken for this visit. .  No significant changes. Lungs are clear to auscultation bilaterally. Heart has regular rate and rhythm. No palpable cervical, supraclavicular, or axillary adenopathy. Abdomen soft, non-tender, normal bowel sounds.   Lab Findings: Lab Results  Component Value Date   WBC 7.3 10/30/2023   HGB 11.2 (L) 11/09/2023   HCT 33.0 (L) 11/09/2023   MCV 97.7 10/30/2023   PLT 133 (L) 10/30/2023    Radiographic Findings: DG Chest 2 View Result Date: 10/25/2023 CLINICAL DATA:  Shortness of breath EXAM: CHEST - 2 VIEW COMPARISON:  08/27/2023 and CT scan from 08/28/2023 FINDINGS: Atherosclerotic calcification of the aortic arch. Band of scarring in the right upper lobe appears stable. Blunted right costophrenic angle compatible with right pleural effusion. Prior CABG. Emphysema. Thoracic  spondylosis. Overall similar appearance to the 08/27/2023 exam. IMPRESSION: 1. Small right pleural effusion. 2. Stable band of scarring in the right upper lobe. 3. Emphysema. 4. Thoracic spondylosis. 5. Aortic Atherosclerosis (ICD10-I70.0). Electronically Signed   By: Ryan Salvage M.D.   On: 10/25/2023 17:49    Impression:  Pet positive Right upper lobe lung nodule, suspicious for primary bronchogenic carcinoma   The patient is recovering from the effects of radiation.  ***  Plan:  ***   *** minutes of total time was spent for this patient encounter, including preparation, face-to-face counseling with the patient and coordination of care, physical exam, and documentation of the encounter. ____________________________________  Lynwood CHARM Nasuti, PhD, MD  This document serves as a record of services personally performed by Lynwood Nasuti, MD. It was created on his behalf by  Dorthy Fuse, a trained medical scribe. The creation of this record is based on the scribe's personal observations and the provider's statements to them. This document has been checked and approved by the attending provider.

## 2023-11-15 NOTE — Telephone Encounter (Signed)
 Pharmacy Patient Advocate Encounter   Received notification from Patient Pharmacy that prior authorization for Humalog  100u/ml is required/requested.   Insurance verification completed.   The patient is insured through [employer] .   Per test claim: Refill too soon. PA is not needed at this time. Medication was filled 11/14/23. Next eligible fill date is 11/19/23.   Key: [id number]

## 2023-11-18 ENCOUNTER — Encounter: Payer: Self-pay | Admitting: Radiation Oncology

## 2023-11-18 ENCOUNTER — Other Ambulatory Visit: Payer: Self-pay

## 2023-11-18 ENCOUNTER — Ambulatory Visit
Admission: RE | Admit: 2023-11-18 | Discharge: 2023-11-18 | Disposition: A | Discharge status: Home / Self Care | Source: Ambulatory Visit | Attending: Radiation Oncology | Admitting: Radiation Oncology

## 2023-11-18 DIAGNOSIS — Z79899 Other long term (current) drug therapy: Secondary | ICD-10-CM | POA: Insufficient documentation

## 2023-11-18 DIAGNOSIS — J432 Centrilobular emphysema: Secondary | ICD-10-CM | POA: Diagnosis not present

## 2023-11-18 DIAGNOSIS — J449 Chronic obstructive pulmonary disease, unspecified: Secondary | ICD-10-CM | POA: Insufficient documentation

## 2023-11-18 DIAGNOSIS — R911 Solitary pulmonary nodule: Secondary | ICD-10-CM

## 2023-11-18 DIAGNOSIS — Z7952 Long term (current) use of systemic steroids: Secondary | ICD-10-CM | POA: Insufficient documentation

## 2023-11-18 DIAGNOSIS — Z7984 Long term (current) use of oral hypoglycemic drugs: Secondary | ICD-10-CM | POA: Diagnosis not present

## 2023-11-18 DIAGNOSIS — I700 Atherosclerosis of aorta: Secondary | ICD-10-CM | POA: Diagnosis not present

## 2023-11-18 DIAGNOSIS — M47814 Spondylosis without myelopathy or radiculopathy, thoracic region: Secondary | ICD-10-CM | POA: Diagnosis not present

## 2023-11-18 DIAGNOSIS — R918 Other nonspecific abnormal finding of lung field: Secondary | ICD-10-CM | POA: Diagnosis not present

## 2023-11-18 DIAGNOSIS — Z923 Personal history of irradiation: Secondary | ICD-10-CM | POA: Diagnosis not present

## 2023-11-18 DIAGNOSIS — J90 Pleural effusion, not elsewhere classified: Secondary | ICD-10-CM | POA: Insufficient documentation

## 2023-11-18 DIAGNOSIS — Z9889 Other specified postprocedural states: Secondary | ICD-10-CM | POA: Insufficient documentation

## 2023-11-18 DIAGNOSIS — Z951 Presence of aortocoronary bypass graft: Secondary | ICD-10-CM | POA: Diagnosis not present

## 2023-11-18 DIAGNOSIS — I2510 Atherosclerotic heart disease of native coronary artery without angina pectoris: Secondary | ICD-10-CM | POA: Insufficient documentation

## 2023-11-18 DIAGNOSIS — M4854XA Collapsed vertebra, not elsewhere classified, thoracic region, initial encounter for fracture: Secondary | ICD-10-CM | POA: Diagnosis not present

## 2023-11-18 DIAGNOSIS — E1122 Type 2 diabetes mellitus with diabetic chronic kidney disease: Secondary | ICD-10-CM

## 2023-11-18 MED ORDER — DEXCOM G7 SENSOR MISC: 3 refills | Status: DC

## 2023-11-18 MED ORDER — EMPAGLIFLOZIN 10 MG PO TABS: 10.0000 mg | ORAL_TABLET | Freq: Every day | ORAL | 0 refills | Status: DC

## 2023-11-18 MED ORDER — DEXCOM G7 RECEIVER DEVI: 0 refills | Status: DC

## 2023-11-18 MED ORDER — INSULIN GLARGINE 100 UNIT/ML SOLOSTAR PEN: 14.0000 [IU] | PEN_INJECTOR | Freq: Every day | SUBCUTANEOUS | 0 refills | Status: DC

## 2023-11-18 MED ORDER — INSULIN PEN NEEDLE 32G X 6 MM MISC: 3 refills | Status: DC

## 2023-11-18 MED ORDER — INSULIN LISPRO 100 UNIT/ML IJ SOLN: INTRAMUSCULAR | 11 refills | Status: DC

## 2023-11-18 NOTE — Progress Notes (Signed)
 Brian [name] is here today for follow up post radiation to the lung.  Lung Side:  Right, patient completed treatment on 03/16/20  Does the patient complain of any of the following: Pain: Denies Shortness of breath w/wo exertion: O2 @ 4L Cough: Denies Hemoptysis: Denies Pain with swallowing: Denies Swallowing/choking concerns: Denies Appetite: Good Energy Level: Good Post radiation skin Changes: Denies    Additional comments if applicable: None at this time.  BP (P) 128/64 (BP Location: Left Arm, Patient Position: Sitting)   Pulse (P) 69   Temp (!) (P) 96 F (35.6 C) (Temporal)   Resp (P) 18   Ht (P) 5' 11 (1.803 m)   Wt (P) 155 lb 2 oz (70.4 kg)   BMI (P) 21.64 kg/m

## 2023-11-21 ENCOUNTER — Other Ambulatory Visit: Payer: Self-pay | Admitting: *Deleted

## 2023-11-21 NOTE — Transitions of Care (Post Inpatient/ED Visit) (Signed)
 Transition of Care week 4/ day # 20  Visit Note  11/21/2023  Name: WHIT BRUNI MRN: [id number]          DOB: 11-May-1931  Situation: Patient enrolled in [facility] 30-day program. Visit completed with patient's daughter- caregiver [name]- verified on [facility] DPR by telephone.   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 (2) unplanned hospital admission x last (12) and (6) months  Baseline vascular dementia:  Resides with caregiver/ daughter who manages all aspects of patient's daily care/ healthcare needs  Initial Transition Care Management Follow-up Telephone Call    Past Medical History:  Diagnosis Date   AAA (abdominal aortic aneurysm)    Arthritis    Asthma 1976   Bronchiolitis 06/2014   CAD (coronary artery disease)    NSTEMI 02/2010;  s/p CABG 1/12 (L-LAD, SOM1, S-PDA); echo in 08/2010: Mild LVH, EF 55%, grade 2 diastolic dysfunction, mild MR, mild LAE, mildly reduced RV function, mild RAE, PASP 42   Carpal tunnel syndrome of left wrist    Cecal angiodysplasia 04/04/2022   Colon polyps    COPD (chronic obstructive pulmonary disease) (HCC)    Diabetes mellitus 2000   Type II, controlls with diet   Dyspnea    ED (erectile dysfunction)    Gout    History of radiation therapy 03/09/2020-03/16/2020   SBRT right lung: Dr. Lynwood Nasuti   HTN (hypertension)    Hyperlipidemia    Inguinal hernia    Right   Macular degeneration    Myocardial infarction (HCC) 1 -13-2012   Neuropathy    Assessment:  Things are still going very well, no problems at all.  Got a good report form the [place] doctors yesterday and from his CT scan where they are monitoring the lung nodule-- doesn't have to go back for another 6 months.  His blood sugars are being managed very well with the sliding scale insulin  and his blood pressures are all normal.  No signs of any more blood in his stools.  He goes too  the [place] again tomorrow for his GI follow up- he is tolerating the octreotide  injections very well    Caregiver denies clinical concerns and reports patient in no distress   Patient Reported Symptoms: Cognitive Cognitive Status: Normal speech and language skills, Alert and oriented to person, place, and time, Insightful and able to interpret abstract concepts (per caregiver- daughter: ongoing memory issues as per baseline: she continues to manage all medical affairs for patient) Cognitive/Intellectual Conditions Management [RPT]: None reported or documented in medical history or problem list Other: Per caregiver: she continues to manage all medical affairs- baseline memory issues      Neurological Neurological Review of Symptoms: No symptoms reported (per caregiver/ daughter)    HEENT HEENT Symptoms Reported: No symptoms reported (per caregiver/ daughter)      Cardiovascular Cardiovascular Symptoms Reported: No symptoms reported, Other: (per caregiver/ daughter) Other Cardiovascular Symptoms: We check his blood pressures at home and they have all been great; I am notnear the recorded list right now, but they have all been within normal limits Does patient have uncontrolled Hypertension?: No Cardiovascular Management Strategies: Adequate rest, Coping strategies, Routine screening, Medication therapy  Respiratory Respiratory Symptoms Reported: No symptoms reported (per caregiver/ daughter) Other Respiratory Symptoms: No shortness of breath; still using home O2 as always- 4-5 L/minute; any shortness of breath he has with activity goes right away when  he stops the activity, within seconds of stopping Respiratory Management Strategies: Oxygen  therapy, Medication therapy, Coping strategies, Asthma action plan, Adequate rest  Endocrine Endocrine Symptoms Reported: No symptoms reported (per caregiver/ daughter) Is patient diabetic?: Yes Is patient checking blood sugars at home?: Yes List most  recent blood sugar readings, include date and time of day: Reports am fasting blood sugar 138 on 11/21/23: reports using sliding scale short-acting insulin  prn: denies significantly low or high recent values; confirmed attended endocrinology provider office visit 11/20/23 at [facility]: they did not make any changes and were happy with his numbers; they have an order in for a CGM which we will pick up when it is ready    Gastrointestinal Gastrointestinal Symptoms Reported: No symptoms reported Additional Gastrointestinal Details: Great appetite; eating good; normal and regular BM's- soft but formed; no signs or symptoms of any GI bleeding Gastrointestinal Management Strategies: Coping strategies, Medication therapy, Diet modification, Adequate rest    Genitourinary Genitourinary Symptoms Reported: No symptoms reported (per caregiver/ daughter) Additional Genitourinary Details: Peeing normally and in good amounts; clear yellow urine    Integumentary Integumentary Symptoms Reported: No symptoms reported (per caregiver/ daughter)    Musculoskeletal Musculoskelatal Symptoms Reviewed: Limited mobility (per caregiver/ daughter) Additional Musculoskeletal Details: confirmed uses assistive devices on regular basis, at baseline -- walker; daughter assists with ambulation as indicated; confirms no new/ recent falls        Psychosocial Psychosocial Symptoms Reported: No symptoms reported (per caregiver/ daughter) Behavioral Management Strategies: Support system Major Change/Loss/Stressor/Fears (CP): Denies Techniques to Cope with Loss/Stress/Change: Diversional activities Quality of Family Relationships: helpful, involved, supportive   There were no vitals filed for this visit.  Medications Reviewed Today     Reviewed by Avaley Coop M, RN (Registered Nurse) on 11/21/23 at 1421  Med List Status: <None>   Medication Order Taking? Sig Documenting Provider Last Dose Status Informant  albuterol   (VENTOLIN  HFA) 108 (90 Base) MCG/ACT inhaler [id number]  Inhale 2 puffs into the lungs every 6 (six) hours as needed for wheezing or shortness of breath. Geofm Glade PARAS, MD  Active Family Member  ANORO ELLIPTA  62.5-25 MCG/ACT AEPB [id number]  USE 1 INHALATION DAILY Geofm Glade PARAS, MD  Active Family Member  Carboxymethylcellulose Sodium 0.25 % SOLN [id number]  Place 1 drop into both eyes every 4 (four) hours. [provider]  Active Family Member  Cholecalciferol  50 MCG (2000 UT) TABS [id number]  Take 2,000 Units by mouth in the morning. [provider]  Active Family Member  Continuous Glucose Receiver [facility] G7 RECEIVER) DEVI [id number]  UAD to monitor sugars  E11.22, N18.4 Geofm Glade PARAS, MD  Active   Continuous Glucose Sensor (DEXCOM G7 SENSOR) MISC [id number]  UAD to monitor sugars - IDDM, E11.22, N18.4 Geofm Glade PARAS, MD  Active   cyanocobalamin  (VITAMIN B12) 500 MCG tablet [id number]  Take 1,000 mcg by mouth daily. [provider]  Active Family Member  empagliflozin  (JARDIANCE ) 10 MG TABS tablet [id number]  Take 1 tablet (10 mg total) by mouth daily before breakfast. Geofm Glade PARAS, MD  Active   escitalopram  (LEXAPRO ) 20 MG tablet [id number]  Take 20 mg by mouth in the morning. [provider]  Active Family Member  ferrous sulfate  325 (65 FE) MG tablet [id number]  Take 1 tablet (325 mg total) by mouth daily with breakfast. Geofm Glade PARAS, MD  Active Family Member  furosemide  (LASIX ) 40 MG tablet [id number]  Take 1 tablet (40 mg total) by mouth daily. Verlin Lonni BIRCH,  MD  Active   insulin  glargine (LANTUS ) 100 UNIT/ML Solostar Pen [id number]  Inject 14 Units into the skin daily. Inject 14 Units into the skin daily. Geofm Glade PARAS, MD  Active   insulin  lispro (HUMALOG ) 100 UNIT/ML injection [id number]  Inject 2-4 units into the skin 3 times a day before meals.  < 150  0 units, 151-200  2 units,  201-250  3 units, >250    4 units Burns, Glade PARAS, MD  Active   Insulin   Pen Needle 32G X 6 MM MISC [id number]  UAD for daily lantus  injections and humalog  injections tid prn Geofm Glade PARAS, MD  Active   memantine  (NAMENDA ) 10 MG tablet [id number]  Take 5 mg by mouth in the morning and at bedtime. [provider]  Active Family Member  mirtazapine  (REMERON ) 15 MG tablet [id number]  Take 0.5 tablets (7.5 mg total) by mouth at bedtime. Geofm Glade PARAS, MD  Active Family Member  octreotide  (SANDOSTATIN  LAR) 20 MG injection [id number]  Inject 20 mg into the muscle every 30 (thirty) days. Jillian Buttery, MD  Active   OXYGEN  [id number]  Inhale 6 L/min into the lungs continuous. [provider]  Active Family Member  pantoprazole  (PROTONIX ) 40 MG tablet [id number]  Take 1 tablet (40 mg total) by mouth daily. Jillian Buttery, MD  Active   predniSONE  (DELTASONE ) 10 MG tablet [id number]  Take 1 tablet (10 mg total) by mouth daily with breakfast. Take 1.5 tablets (15mg  total) daily with breakfast Jillian Buttery, MD  Active   simvastatin  (ZOCOR ) 40 MG tablet [id number]  Take 1 tablet (40 mg total) by mouth daily. Geofm Glade PARAS, MD  Active Family Member  sulfamethoxazole -trimethoprim  (BACTRIM  DS) 800-160 MG tablet [id number]  Take 1 tablet by mouth 3 (three) times a week. Shelah Lamar RAMAN, MD  Active Family Member  Med List Note Leobardo Garre, CPhT 08/28/23 0130): PATIENT HAS DIFFICULTY SWALLOWING LARGE PILLS; PLEASE BREAK IN HALF. All local 30 day or less are filled at [facility] on [address].           Recommendation:   Specialty provider follow-up- as [facility] Continue Current Plan of Care  Follow Up Plan:   Telephone follow-up in 1 week- as scheduled 11/29/23  Pls call/ message for questions,  Chandlar Guice Mckinney Dylan Monforte, RN, BSN, CCRN Alumnus RN Care Manager  Transitions of Care  [facility] - [facility] [phone]: direct office

## 2023-11-21 NOTE — Telephone Encounter (Signed)
This was addressed already.

## 2023-11-21 NOTE — Patient Instructions (Signed)
 Visit Information  Thank you for taking time to visit with me today. Please don't hesitate to contact me if I can be of assistance to you before our next scheduled telephone appointment.  Our next appointment is by telephone on Friday, November 29, 2023 at 11:30 am  Please call the care guide team at [phone] if you need to cancel or reschedule your appointment.   Following are the goals we discussed today:  Patient Self Care Activities:  Attend all scheduled provider appointments Call provider office for new concerns or questions  Participate in Transition of Care Program/Attend TOC scheduled calls Take medications as prescribed   Report any signs/ symptoms GI bleeding ( as we have discussed during our phone calls) immediately to your provider, seek emergency assistance as needed Everyday tips for care of patient with dementia- Keep a routine, such as bathing dressing eating at the same time each day, plan activities that the person enjoys and try to do them at the same time each day, serve meals in a consistent, familiar place and give patient enough time to eat, encourage use of loose-fitting, comfortable, easy to use clothing such as, elastic waistbands, fabric fasteners, or large zipper pulls instead of shoelaces, buttons, or buckles Continue pacing activity as your recuperation from recent hospital visit continues Continue monitoring and recording your blood sugars at home and using the new insulin  as prescribed Continue using home oxygen  as prescribed Use assistive devices as needed to prevent falls If you believe your condition is getting worse- contact your care providers (doctors) promptly- reaching out to your doctor early when you have concerns can prevent you from having to go to the hospital  If you are experiencing a Mental Health or Behavioral Health Crisis or need someone to talk to, please  call the Suicide and Crisis Lifeline: 988 call the [place]  National Suicide Prevention  Lifeline: 437-702-6696 or TTY: 954-594-8222 TTY 437-861-4914) to talk to a trained counselor call 1-800-273-TALK (toll free, 24 hour hotline) go to [facility] [address], [place] [phone]) call the [facility] Crisis Line: 316-425-2875 call 911   Patient's caregiver verbalizes understanding of instructions and care plan provided today and agrees to view in MyChart. Active MyChart status and patient understanding of how to access instructions and care plan via MyChart confirmed with patient.     Pls call/ message for questions,  Rashawd Laskaris Mckinney Kalsey Lull, RN, BSN, CCRN Alumnus RN Care Manager  Transitions of Care  [facility] - [facility] [phone]: direct office

## 2023-11-22 ENCOUNTER — Other Ambulatory Visit: Payer: Self-pay

## 2023-11-22 DIAGNOSIS — E1122 Type 2 diabetes mellitus with diabetic chronic kidney disease: Secondary | ICD-10-CM

## 2023-11-22 MED ORDER — INSULIN PEN NEEDLE 32G X 6 MM MISC: Status: DC

## 2023-11-22 MED ORDER — EMPAGLIFLOZIN 10 MG PO TABS: 10.0000 mg | ORAL_TABLET | Freq: Every day | ORAL | 5 refills | Status: DC

## 2023-11-22 MED ORDER — INSULIN GLARGINE 100 UNIT/ML SOLOSTAR PEN
14.0000 [IU] | PEN_INJECTOR | Freq: Every day | SUBCUTANEOUS | Status: DC
Start: 2023-11-22 — End: 2023-11-22

## 2023-11-22 MED ORDER — INSULIN LISPRO 100 UNIT/ML IJ SOLN: INTRAMUSCULAR | 11 refills | Status: AC

## 2023-11-22 MED ORDER — DEXCOM G7 SENSOR MISC: Status: DC

## 2023-11-22 MED ORDER — INSULIN PEN NEEDLE 32G X 6 MM MISC: 2 refills | Status: AC

## 2023-11-22 MED ORDER — EMPAGLIFLOZIN 10 MG PO TABS
10.0000 mg | ORAL_TABLET | Freq: Every day | ORAL | Status: DC
Start: 2023-11-22 — End: 2023-11-22

## 2023-11-22 MED ORDER — DEXCOM G7 RECEIVER DEVI: 0 refills | Status: DC

## 2023-11-22 MED ORDER — DEXCOM G7 SENSOR MISC: 4 refills | Status: DC

## 2023-11-22 MED ORDER — DEXCOM G7 RECEIVER DEVI: Status: DC

## 2023-11-22 MED ORDER — INSULIN GLARGINE 100 UNIT/ML SOLOSTAR PEN: 14.0000 [IU] | PEN_INJECTOR | Freq: Every day | SUBCUTANEOUS | Status: DC

## 2023-11-22 MED ORDER — INSULIN LISPRO 100 UNIT/ML IJ SOLN
INTRAMUSCULAR | Status: DC
Start: 2023-11-22 — End: 2023-11-22

## 2023-11-22 MED ORDER — INSULIN GLARGINE 100 UNIT/ML SOLOSTAR PEN: 14.0000 [IU] | PEN_INJECTOR | Freq: Every day | SUBCUTANEOUS | 2 refills | Status: AC

## 2023-11-29 ENCOUNTER — Other Ambulatory Visit: Payer: Self-pay | Admitting: *Deleted

## 2023-11-29 DIAGNOSIS — E1122 Type 2 diabetes mellitus with diabetic chronic kidney disease: Secondary | ICD-10-CM

## 2023-11-29 DIAGNOSIS — K922 Gastrointestinal hemorrhage, unspecified: Secondary | ICD-10-CM

## 2023-11-29 DIAGNOSIS — J449 Chronic obstructive pulmonary disease, unspecified: Secondary | ICD-10-CM

## 2023-11-29 NOTE — Patient Instructions (Signed)
 Visit Information  Thank you for taking time to visit with me today. Please don't hesitate to contact me if I can be of assistance to you before our next scheduled telephone appointment.  It has been a pleasure working with you over the last 30 days!  Please listen for a call from the scheduling care guide to schedule a phone call with the new nurse care manager who will pick up in your care where we are leaving off today  Following are the goals we discussed today:  Patient Self Care Activities:  Attend all scheduled provider appointments Call provider office for new concerns or questions  Take medications as prescribed   Report any signs/ symptoms GI bleeding ( as we have discussed during our phone calls) immediately to your provider, seek emergency assistance as needed Everyday tips for care of patient with dementia- Keep a routine, such as bathing dressing eating at the same time each day, plan activities that the person enjoys and try to do them at the same time each day, serve meals in a consistent, familiar place and give patient enough time to eat, encourage use of loose-fitting, comfortable, easy to use clothing such as, elastic waistbands, fabric fasteners, or large zipper pulls instead of shoelaces, buttons, or buckles Continue pacing activity as your recuperation from recent hospital visit continues Continue monitoring and recording your blood sugars at home and using the new insulin  as prescribed Continue using home oxygen  as prescribed Use assistive devices as needed to prevent falls If you believe your condition is getting worse- contact your care providers (doctors) promptly- reaching out to your doctor early when you have concerns can prevent you from having to go to the hospital  If you are experiencing a Mental Health or Behavioral Health Crisis or need someone to talk to, please  call the Suicide and Crisis Lifeline: 988 call the [place]  National Suicide Prevention Lifeline:  (289)276-3929 or TTY: 603-393-3897 TTY 434-584-5933) to talk to a trained counselor call 1-800-273-TALK (toll free, 24 hour hotline) go to [facility] [address] [place] [phone]) call the [facility] Crisis Line: [phone] call 911   Patient's caregiver verbalizes understanding of instructions and care plan provided today and agrees to view in MyChart. Active MyChart status and patient understanding of how to access instructions and care plan via MyChart confirmed with patient.     Briseidy Spark Mckinney Cherissa Hook, RN, BSN, Media planner  Transitions of Care  [facility] - [facility] [phone]: direct office

## 2023-11-29 NOTE — Transitions of Care (Post Inpatient/ED Visit) (Signed)
 Transition of Care week # 5/ day # 28 TOC 30-day program case closure with referral to [facility] longitudinal RN CM  Visit Note  11/29/2023  Name: Brian [name] MRN: [id number]          DOB: 1931/12/04  Situation: Patient enrolled in [facility] 30-day program. Visit completed with patient's daughter- caregiver  by telephone.   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 (2) unplanned hospital admission x last (12) and (6) months  Baseline vascular dementia:  Resides with caregiver/ daughter who manages all aspects of patient's daily care/ healthcare needs Home O2 and walker use at baseline Recent insulin  therapy: due to concomitant octreotide  and prednisone  therapy  TOC 30--day outreach completed; patient has successfully met/ accomplished established goals for [facility] 30-day program without hospital readmission; referral was sent for ongoing follow up with longitudinal RN CM   Initial Transition Care Management Follow-up Telephone Call    Past Medical History:  Diagnosis Date   AAA (abdominal aortic aneurysm)    Arthritis    Asthma 1976   Bronchiolitis 06/2014   CAD (coronary artery disease)    NSTEMI 02/2010;  s/p CABG 1/12 (L-LAD, SOM1, S-PDA); echo in 08/2010: Mild LVH, EF 55%, grade 2 diastolic dysfunction, mild MR, mild LAE, mildly reduced RV function, mild RAE, PASP 42   Carpal tunnel syndrome of left wrist    Cecal angiodysplasia 04/04/2022   Colon polyps    COPD (chronic obstructive pulmonary disease) (HCC)    Diabetes mellitus 2000   Type II, controlls with diet   Dyspnea    ED (erectile dysfunction)    Gout    History of radiation therapy 03/09/2020-03/16/2020   SBRT right lung: Dr. Lynwood Nasuti   HTN (hypertension)    Hyperlipidemia    Inguinal hernia    Right   Macular degeneration    Myocardial infarction [facility]) 1 -13-2012   Neuropathy    Assessment:  Everything is going fine; he is still having  some low blood sugars now and then- but we are giving him the regular snacks like you told us  to; he always tells us  he feels fine with the low numbers- and he doesn't have symptoms of feeling bad in any way- so whenever he is low- we just feed him right away and that takes care of it.  No falls; no signs of any more blood in his poop since they started the octreotide  injections.  No falls- still using walker; using home O2 like he has for years: 4 L/min.  Everything is looking really good   Caregiver denies clinical concerns and reports patient in no distress   Patient Reported Symptoms: Cognitive Cognitive Status: Normal speech and language skills, Alert and oriented to person, place, and time, Insightful and able to interpret abstract concepts, Requires Assistance Decision Making (per caregiver- daughter: she continues to manage all health care affairs) Cognitive/Intellectual Conditions Management [RPT]: None reported or documented in medical history or problem list      Neurological Neurological Review of Symptoms: No symptoms reported (per caregiver- daughter) Oher Neurological Symptoms/Conditions [RPT]: per caregiver report: baseline vascular dementia    HEENT HEENT Symptoms Reported: No symptoms reported (per caregiver- daughter)      Cardiovascular Cardiovascular Symptoms Reported: No symptoms reported (per caregiver/ daughter) Does patient have uncontrolled Hypertension?: No Cardiovascular Management Strategies: Adequate rest, Coping strategies, Medication therapy, Routine screening  Respiratory Respiratory Symptoms Reported: No symptoms reported (per caregiver/ daughter) Other Respiratory  Symptoms: Still using home O2 at 4 L/min; breathing fine with no shortness of breath that is not normal- his shortness of breath goes right away when he rests Respiratory Management Strategies: Oxygen  therapy, Medication therapy, Adequate rest, Asthma action plan, Coping strategies  Endocrine  Endocrine Symptoms Reported: No symptoms reported (per caregiver/ daughter) Is patient diabetic?: Yes Is patient checking blood sugars at home?: Yes List most recent blood sugar readings, include date and time of day: Reports He has been having occasional low blood sugar readings since they started the insulin - this morning it was 64- but he had no symptoms of low blood sugar- just the low number- said he felt fine.  I fed him right away and he looks good and says he feels fine.  Still monitoring the blood sugars several times a day every day.  [facility] CGM Dexcom- his PCP has been out on vacation so we are just using the meter for now.  He is eating good and we are giving him regaular snacks as you advised since they started this new insulin     Gastrointestinal Gastrointestinal Symptoms Reported: No symptoms reported (per caregiver/ daughter) Additional Gastrointestinal Details: Daughter denies signs/ symptoms recent GI bleeding- she is able to verbalize along with corresponding action plan without additional prompting; confirms having normal and regular BM's; good appetite Confirms continues taking ocreotide injections as prescribed Gastrointestinal Management Strategies: Coping strategies, Adequate rest, Medication therapy    Genitourinary Genitourinary Symptoms Reported: No symptoms reported (per caregiver/ daughter)    Integumentary Integumentary Symptoms Reported: No symptoms reported (Reinforced signs/ symptoms GI bleeding along with corresponding action plan)    Musculoskeletal Musculoskelatal Symptoms Reviewed: Limited mobility, Difficulty walking, Unsteady gait Additional Musculoskeletal Details: confirmed uses assistive devices on regular basis, at baseline -- walker; daughter/ son assists with ambulation as indicated (gait belt); confirms no new/ recent falls Musculoskeletal Management Strategies: Adequate rest, Coping strategies, Routine  screening, Medical device   Fall risk Follow up: Education provided, Falls prevention discussed  Psychosocial Psychosocial Symptoms Reported: No symptoms reported (Reinforced signs/ symptoms GI bleeding along with corresponding action plan) Behavioral Management Strategies: Support system, Coping strategies Major Change/Loss/Stressor/Fears (CP): Denies (Reinforced signs/ symptoms GI bleeding along with corresponding action plan) Techniques to Cope with Loss/Stress/Change: Diversional activities Quality of Family Relationships: supportive, involved, helpful (Reinforced signs/ symptoms GI bleeding along with corresponding action plan)   There were no vitals filed for this visit.  Medications Reviewed Today     Reviewed by Ayahna Solazzo M, RN (Registered Nurse) on 11/29/23 at 1131  Med List Status: <None>   Medication Order Taking? Sig Documenting Provider Last Dose Status Informant  albuterol  (VENTOLIN  HFA) 108 (90 Base) MCG/ACT inhaler [id number]  Inhale 2 puffs into the lungs every 6 (six) hours as needed for wheezing or shortness of breath. Geofm Glade PARAS, MD  Active Family Member  ANORO ELLIPTA  62.5-25 MCG/ACT AEPB [id number]  USE 1 INHALATION DAILY Geofm Glade PARAS, MD  Active Family Member  Carboxymethylcellulose Sodium 0.25 % SOLN [id number]  Place 1 drop into both eyes every 4 (four) hours. [provider]  Active Family Member  Cholecalciferol  50 MCG (2000 UT) TABS [id number]  Take 2,000 Units by mouth in the morning. [provider]  Active Family Member  Continuous Glucose Receiver [place] G7 RECEIVER) DEVI [id number]  UAD to monitor sugars  E11.22, N18.4 Geofm Glade PARAS, MD  Active   Continuous Glucose Sensor (DEXCOM G7 SENSOR) [place] [id number]  UAD to monitor sugars - IDDM, E11.22, N18.4 Burns, Glade PARAS, MD  Active   cyanocobalamin  (VITAMIN B12) 500 MCG tablet [id number]  Take 1,000 mcg by mouth daily. [provider]  Active Family Member  empagliflozin  (JARDIANCE )  10 MG TABS tablet [id number]  Take 1 tablet (10 mg total) by mouth daily before breakfast. Geofm Glade PARAS, MD  Active   escitalopram  (LEXAPRO ) 20 MG tablet [id number]  Take 20 mg by mouth in the morning. [provider]  Active Family Member  ferrous sulfate  325 (65 FE) MG tablet [id number]  Take 1 tablet (325 mg total) by mouth daily with breakfast. Geofm Glade PARAS, MD  Active Family Member  furosemide  (LASIX ) 40 MG tablet [id number]  Take 1 tablet (40 mg total) by mouth daily. Verlin Lonni BIRCH, MD  Active   insulin  glargine (LANTUS ) 100 UNIT/ML Solostar Pen [id number]  Inject 14 Units into the skin daily. Inject 14 Units into the skin daily. Geofm Glade PARAS, MD  Active   insulin  lispro (HUMALOG ) 100 UNIT/ML injection [id number]  Inject 2-4 units into the skin 3 times a day before meals.  < 150  0 units, 151-200  2 units,  201-250  3 units, >250    4 units Burns, Glade PARAS, MD  Active   Insulin  Pen Needle 32G X 6 MM MISC [id number]  UAD for daily lantus  injections and humalog  injections tid prn Geofm Glade PARAS, MD  Active   memantine  (NAMENDA ) 10 MG tablet [id number]  Take 5 mg by mouth in the morning and at bedtime. [provider]  Active Family Member  mirtazapine  (REMERON ) 15 MG tablet [id number]  Take 0.5 tablets (7.5 mg total) by mouth at bedtime. Geofm Glade PARAS, MD  Active Family Member  octreotide  (SANDOSTATIN  LAR) 20 MG injection [id number]  Inject 20 mg into the muscle every 30 (thirty) days. Jillian Buttery, MD  Active   OXYGEN  [id number]  Inhale 6 L/min into the lungs continuous. [provider]  Active Family Member  pantoprazole  (PROTONIX ) 40 MG tablet [id number]  Take 1 tablet (40 mg total) by mouth daily. Jillian Buttery, MD  Active   predniSONE  (DELTASONE ) 10 MG tablet [id number]  Take 1 tablet (10 mg total) by mouth daily with breakfast. Take 1.5 tablets (15mg  total) daily with breakfast Jillian Buttery, MD  Active   simvastatin  (ZOCOR ) 40 MG tablet [id number]  Take 1  tablet (40 mg total) by mouth daily. Geofm Glade PARAS, MD  Active Family Member  sulfamethoxazole -trimethoprim  (BACTRIM  DS) 800-160 MG tablet [id number]  Take 1 tablet by mouth 3 (three) times a week. Shelah Lamar RAMAN, MD  Active Family Member  Med List Note Leobardo Garre, CPhT 08/28/23 0130): PATIENT HAS DIFFICULTY SWALLOWING LARGE PILLS; PLEASE BREAK IN HALF. All local 30 day or less are filled at [facility] on [address].           Recommendation:   Continue Current Plan of Care  Follow Up Plan:   Referral to RN Case Manager Closing From:  Transitions of Care Program  Pls call/ message for questions,  Samul Mcinroy Mckinney Nyana Haren, RN, BSN, CCRN Alumnus RN Care Manager  Transitions of Care  [facility] - [facility] [phone]: direct office

## 2023-12-02 ENCOUNTER — Other Ambulatory Visit: Payer: Self-pay

## 2023-12-02 ENCOUNTER — Encounter: Payer: Self-pay | Admitting: Internal Medicine

## 2023-12-02 MED ORDER — PANTOPRAZOLE SODIUM 40 MG PO TBEC: 40.0000 mg | DELAYED_RELEASE_TABLET | Freq: Every day | ORAL | 1 refills | Status: DC

## 2023-12-09 ENCOUNTER — Encounter: Payer: Self-pay | Admitting: Emergency Medicine

## 2023-12-09 MED ORDER — SULFAMETHOXAZOLE-TRIMETHOPRIM 800-160 MG PO TABS: 1.0000 | ORAL_TABLET | ORAL | 5 refills | Status: DC

## 2023-12-10 ENCOUNTER — Other Ambulatory Visit: Payer: Self-pay

## 2023-12-10 MED ORDER — PANTOPRAZOLE SODIUM 40 MG PO TBEC: 40.0000 mg | DELAYED_RELEASE_TABLET | Freq: Every day | ORAL | 1 refills | Status: AC

## 2023-12-16 ENCOUNTER — Telehealth: Payer: Self-pay

## 2023-12-17 ENCOUNTER — Ambulatory Visit

## 2023-12-17 ENCOUNTER — Encounter: Payer: Self-pay | Admitting: Primary Care

## 2023-12-17 ENCOUNTER — Ambulatory Visit: Payer: Self-pay | Admitting: Emergency Medicine

## 2023-12-17 ENCOUNTER — Ambulatory Visit: Admitting: Primary Care

## 2023-12-17 VITALS — BP 118/66 | HR 63 | Temp 97.7°F | Ht 70.5 in | Wt 165.4 lb

## 2023-12-17 DIAGNOSIS — R0602 Shortness of breath: Secondary | ICD-10-CM

## 2023-12-17 DIAGNOSIS — J9610 Chronic respiratory failure, unspecified whether with hypoxia or hypercapnia: Secondary | ICD-10-CM

## 2023-12-17 DIAGNOSIS — J209 Acute bronchitis, unspecified: Secondary | ICD-10-CM

## 2023-12-17 DIAGNOSIS — J449 Chronic obstructive pulmonary disease, unspecified: Secondary | ICD-10-CM

## 2023-12-17 DIAGNOSIS — Z87891 Personal history of nicotine dependence: Secondary | ICD-10-CM

## 2023-12-17 DIAGNOSIS — E119 Type 2 diabetes mellitus without complications: Secondary | ICD-10-CM

## 2023-12-17 DIAGNOSIS — I5032 Chronic diastolic (congestive) heart failure: Secondary | ICD-10-CM

## 2023-12-17 LAB — CBC WITH DIFFERENTIAL/PLATELET
Basophils Absolute: 0 K/uL (ref 0.0–0.1)
Basophils Relative: 0.3 % (ref 0.0–3.0)
Eosinophils Absolute: 0.1 K/uL (ref 0.0–0.7)
Eosinophils Relative: 0.7 % (ref 0.0–5.0)
HCT: 30.5 % — ABNORMAL LOW (ref 39.0–52.0)
Hemoglobin: 9.7 g/dL — ABNORMAL LOW (ref 13.0–17.0)
Lymphocytes Relative: 5.8 % — ABNORMAL LOW (ref 12.0–46.0)
Lymphs Abs: 0.5 K/uL — ABNORMAL LOW (ref 0.7–4.0)
MCHC: 31.8 g/dL (ref 30.0–36.0)
MCV: 93.7 fl (ref 78.0–100.0)
Monocytes Absolute: 0.3 K/uL (ref 0.1–1.0)
Monocytes Relative: 4.3 % (ref 3.0–12.0)
Neutro Abs: 7.2 K/uL (ref 1.4–7.7)
Neutrophils Relative %: 88.9 % — ABNORMAL HIGH (ref 43.0–77.0)
Platelets: 170 K/uL (ref 150.0–400.0)
RBC: 3.25 Mil/uL — ABNORMAL LOW (ref 4.22–5.81)
RDW: 16.7 % — ABNORMAL HIGH (ref 11.5–15.5)
WBC: 8.1 K/uL (ref 4.0–10.5)

## 2023-12-17 LAB — BASIC METABOLIC PANEL WITH GFR
BUN: 42 mg/dL — ABNORMAL HIGH (ref 6–23)
CO2: 29 meq/L (ref 19–32)
Calcium: 8.7 mg/dL (ref 8.4–10.5)
Chloride: 103 meq/L (ref 96–112)
Creatinine, Ser: 2.56 mg/dL — ABNORMAL HIGH (ref 0.40–1.50)
GFR: 21.54 mL/min — ABNORMAL LOW (ref >60.00)
Glucose, Bld: 258 mg/dL — ABNORMAL HIGH (ref 70–99)
Potassium: 5.7 meq/L — ABNORMAL HIGH (ref 3.5–5.1)
Sodium: 140 meq/L (ref 135–145)

## 2023-12-17 NOTE — Progress Notes (Unsigned)
 @Patient  ID: Brian [name], male    DOB: [birth year], 89 y.o.   MRN: [id number]  No chief complaint on file.   Referring provider: Geofm Glade PARAS, MD  HPI: 89 year old male, former smoker quit in 2005 (40 pack year hx). PMH significant for COPD, pleural effusion due to CHF, chronic hypoxemic respiratory.   12/17/2023 Discussed the use of AI scribe software for clinical note transcription with the patient, who gave verbal consent to proceed.  History of Present Illness Brian [name] is an 89 year old male with COPD and lung cancer who presents with increased shortness of breath, wheezing, and productive cough. He primarily follows with Dr. Shelah for his COPD management.  He has a history of COPD and lung cancer, for which he received radiation therapy. In August, his oxygen  requirements decreased from six liters to four liters, and a prednisone  taper was initiated. Over the past week, he has experienced increased shortness of breath and wheezing, which has gradually worsened and occurs with minimal activity. He denies any significant cough or mucus production, sister did notice slight production with yellow mucus but does not sound significant per patient.   He was instructed to taper his prednisone  from twenty milligrams to fifteen milligrams daily for four weeks, then to ten milligrams daily for four weeks, and finally to five milligrams daily. However, he was only able to reduce to ten milligrams before his symptoms worsened. Recently, his prednisone  was increased back to twenty milligrams due to the exacerbation of symptoms.  He underwent a CT scan in September, which showed stable post-treatment changes in the right upper lobe with no evidence of local recurrence or metastatic disease, and a decrease in fluid in the right lung. He was hospitalized in September for a GI bleed and was in the emergency room for high glucose levels. He takes iron  daily with vitamin C and his blood sugars  have been stable, though occasionally low in the morning.  He is currently on forty milligrams of furosemide  daily, which was increased from an alternating dose of forty and twenty milligrams after his hospitalization in July for a bowel bleed. He also takes Bactrim  three times a week as prophylaxis while on prednisone , and Anoro once a day. His oxygen  is set at four liters, but it is increased to five liters during activities such as showering. He has a nebulizer at home. No recent fevers or contact with sick individuals.  CT chest in September 2025 showed stable post treatment changes right upper lobe. No evidence of local recurrence or metastatic disease. Internal decrease in size of chronic right pleural effusion    Allergies  Allergen Reactions   Meperidine Hcl Swelling and Other (See Comments)    Tongue swelling Because of a history of documented adverse serious drug reaction, Medi Alert bracelet  is recommended.   Sertraline  Shortness Of Breath and Other (See Comments)    Burning sensation from Head to toes, diff breathing   Gabapentin  Other (See Comments)    Confusion    Memantine  Other (See Comments)    Can take only up to 5 mg a day- causes lethargy past that amount   Promethazine Swelling and Other (See Comments)    Tongue swells   Latex Rash and Other (See Comments)    Minor rash   Zolpidem Tartrate Other (See Comments)    Nightmares    Immunization History  Administered Date(s) Administered   Fluad Quad(high Dose 65+) 10/15/2018, 12/02/2019, 10/19/2020  Fluad Trivalent(High Dose 65+) 11/08/2022   INFLUENZA, HIGH DOSE SEASONAL PF 11/08/2015, 11/22/2017, 11/13/2021   Influenza Split 12/21/2010, 12/05/2011   Influenza Whole 01/01/2007, 12/09/2008, 11/24/2009   Influenza,inj,Quad PF,6+ Mos 11/20/2012, 10/29/2013, 11/23/2014   Influenza-Unspecified 12/11/2016, 10/21/2019, 10/19/2020   Moderna Covid-19 Fall Seasonal Vaccine 62yrs & older 04/26/2022   Moderna Covid-19  Vaccine Bivalent Booster 80yrs & up 11/17/2020   Moderna Sars-Covid-2 Vaccination 03/15/2019, 04/12/2019, 12/24/2019, 06/10/2020   Pneumococcal Conjugate-13 03/10/2014, 03/09/2015   Pneumococcal Polysaccharide-23 02/20/2008   Pneumococcal-Unspecified 10/21/2002, 04/28/2008   Td 07/16/2007, 04/25/2021   Td (Adult),5 Lf Tetanus Toxid, Preservative Free 04/25/2021   Tdap 02/26/2011   Zoster Recombinant(Shingrix) 10/03/2016, 12/02/2016   Zoster, Live 07/16/2007    Past Medical History:  Diagnosis Date   AAA (abdominal aortic aneurysm)    Arthritis    Asthma 1976   Bronchiolitis 06/2014   CAD (coronary artery disease)    NSTEMI 02/2010;  s/p CABG 1/12 (L-LAD, SOM1, S-PDA); echo in 08/2010: Mild LVH, EF 55%, grade 2 diastolic dysfunction, mild MR, mild LAE, mildly reduced RV function, mild RAE, PASP 42   Carpal tunnel syndrome of left wrist    Cecal angiodysplasia 04/04/2022   Colon polyps    COPD (chronic obstructive pulmonary disease) (HCC)    Diabetes mellitus 2000   Type II, controlls with diet   Dyspnea    ED (erectile dysfunction)    Gout    History of radiation therapy 03/09/2020-03/16/2020   SBRT right lung: Dr. Lynwood Nasuti   HTN (hypertension)    Hyperlipidemia    Inguinal hernia    Right   Macular degeneration    Myocardial infarction (HCC) 1 -13-2012   Neuropathy     Tobacco History: Social History   Tobacco Use  Smoking Status Former   Current packs/day: 0.00   Average packs/day: 1 pack/day for 40.0 years (40.0 ttl pk-yrs)   Types: Cigarettes   Start date: 02/20/1963   Quit date: 02/20/2003   Years since quitting: 20.8   Passive exposure: Past  Smokeless Tobacco Never   Counseling given: Not Answered   Outpatient Medications Prior to Visit  Medication Sig Dispense Refill   albuterol  (VENTOLIN  HFA) 108 (90 Base) MCG/ACT inhaler Inhale 2 puffs into the lungs every 6 (six) hours as needed for wheezing or shortness of breath. 18 g 4   ANORO ELLIPTA  62.5-25  MCG/ACT AEPB USE 1 INHALATION DAILY 180 each 3   Carboxymethylcellulose Sodium 0.25 % SOLN Place 1 drop into both eyes every 4 (four) hours.     Cholecalciferol  50 MCG (2000 UT) TABS Take 2,000 Units by mouth in the morning.     Continuous Glucose Receiver (DEXCOM G7 RECEIVER) [place] UAD to monitor sugars  E11.22, N18.4 1 each 0   Continuous Glucose Sensor (DEXCOM G7 SENSOR) MISC UAD to monitor sugars - IDDM, E11.22, N18.4 3 each 4   cyanocobalamin  (VITAMIN B12) 500 MCG tablet Take 1,000 mcg by mouth daily.     empagliflozin  (JARDIANCE ) 10 MG TABS tablet Take 1 tablet (10 mg total) by mouth daily before breakfast. 30 tablet 5   escitalopram  (LEXAPRO ) 20 MG tablet Take 20 mg by mouth in the morning.     ferrous sulfate  325 (65 FE) MG tablet Take 1 tablet (325 mg total) by mouth daily with breakfast. 90 tablet 1   furosemide  (LASIX ) 40 MG tablet Take 1 tablet (40 mg total) by mouth daily. 30 tablet 0   insulin  glargine (LANTUS ) 100 UNIT/ML Solostar Pen Inject 14  Units into the skin daily. Inject 14 Units into the skin daily. 15 mL 2   insulin  lispro (HUMALOG ) 100 UNIT/ML injection Inject 2-4 units into the skin 3 times a day before meals.  < 150  0 units, 151-200  2 units,  201-250  3 units, >250    4 units 10 mL 11   Insulin  Pen Needle 32G X 6 MM MISC UAD for daily lantus  injections and humalog  injections tid prn 100 each 2   memantine  (NAMENDA ) 10 MG tablet Take 5 mg by mouth in the morning and at bedtime.     mirtazapine  (REMERON ) 15 MG tablet Take 0.5 tablets (7.5 mg total) by mouth at bedtime.     octreotide  (SANDOSTATIN  LAR) 20 MG injection Inject 20 mg into the muscle every 30 (thirty) days. 1 each 0   OXYGEN  Inhale 6 L/min into the lungs continuous.     pantoprazole  (PROTONIX ) 40 MG tablet Take 1 tablet (40 mg total) by mouth daily. 90 tablet 1   predniSONE  (DELTASONE ) 10 MG tablet Take 1 tablet (10 mg total) by mouth daily with breakfast. Take 1.5 tablets (15mg  total) daily with breakfast      simvastatin  (ZOCOR ) 40 MG tablet Take 1 tablet (40 mg total) by mouth daily. 90 tablet 3   sulfamethoxazole -trimethoprim  (BACTRIM  DS) 800-160 MG tablet Take 1 tablet by mouth 3 (three) times a week. 12 tablet 5   No facility-administered medications prior to visit.   Review of Systems  Review of Systems  Constitutional: Negative.   HENT:  Negative for congestion.   Respiratory:  Positive for shortness of breath and wheezing. Negative for cough.   Cardiovascular: Negative.    Physical Exam  There were no vitals taken for this visit. Physical Exam Constitutional:      General: He is not in acute distress.    Appearance: He is well-developed. He is not ill-appearing.  Cardiovascular:     Rate and Rhythm: Normal rate and regular rhythm.     Comments: No LE edema Pulmonary:     Effort: Pulmonary effort is normal.     Breath sounds: No decreased breath sounds, wheezing or rhonchi.     Comments: Rales to auscultation on anterior chest wall   Musculoskeletal:        General: Normal range of motion.  Skin:    General: Skin is warm and dry.  Neurological:     General: No focal deficit present.     Mental Status: He is alert and oriented to person, place, and time.  Psychiatric:        Mood and Affect: Mood normal.        Behavior: Behavior normal.      Lab Results:  CBC    Component Value Date/Time   WBC 7.3 10/30/2023 0508   RBC 3.05 (L) 10/30/2023 0508   HGB 11.2 (L) 11/09/2023 0019   HCT 33.0 (L) 11/09/2023 0019   PLT 133 (L) 10/30/2023 0508   MCV 97.7 10/30/2023 0508   MCH 29.5 10/30/2023 0508   MCHC 30.2 10/30/2023 0508   RDW 16.3 (H) 10/30/2023 0508   LYMPHSABS 0.4 (L) 09/18/2023 1149   MONOABS 0.6 09/18/2023 1149   EOSABS 0.0 09/18/2023 1149   BASOSABS 0.0 09/18/2023 1149    BMET    Component Value Date/Time   NA 138 11/09/2023 0048   NA 143 10/10/2021 1054   K 5.1 11/09/2023 0048   CL 101 11/09/2023 0048   CO2 23 11/09/2023 0048  GLUCOSE 390 (H)  11/09/2023 0048   BUN 77 (H) 11/09/2023 0048   BUN 28 (H) 10/10/2021 1054   CREATININE 2.90 (H) 11/09/2023 0048   CREATININE 1.58 (H) 10/15/2019 1117   CALCIUM 9.1 11/09/2023 0048   CALCIUM 9.0 03/16/2016 0000   GFRNONAA 20 (L) 11/09/2023 0048   GFRNONAA 39 (L) 10/15/2019 1117   GFRAA 46 (L) 10/15/2019 1117    BNP    Component Value Date/Time   BNP 576.0 (H) 08/27/2023 2206    ProBNP    Component Value Date/Time   PROBNP 1,612.0 (H) 10/25/2023 1707    Imaging: No results found.   Assessment & Plan:   1. Acute bronchitis, unspecified organism (Primary) - DG Chest 2 View; Future  2. Shortness of breath - Pro b natriuretic peptide; Future - Basic metabolic panel with GFR; Future - CBC with Differential; Future  3. Chronic heart failure with preserved ejection fraction (HCC) - Pro b natriuretic peptide; Future - Basic metabolic panel with GFR; Future  Assessment and Plan Assessment & Plan Heart failure with suspected volume overload exacerbation Suspected exacerbation of heart failure with volume overload, possibly contributing to increased shortness of breath and wheezing. Recent chest x-ray shows small-moderate pleural effusion in the right lung. Differential includes COPD exacerbation and heart failure exacerbation. BNP was previously elevated at 1600, indicating possible heart failure exacerbation. - Ordered blood work to assess BNP and kidney function - Will consider increasing diuretics if kidney function allows - Will consult cardiology if diuretic adjustment is insufficient - Will consider thoracentesis if diuretic adjustment is not feasible due to kidney function  Chronic obstructive pulmonary disease (COPD) with chronic respiratory failure Recent increase in shortness of breath, wheezing. Rare productive cough. Symptoms have been gradually worsening over the past week. Recent prednisone  tapering may have contributed to exacerbation. Current assessment leans  towards fluid overload rather than COPD exacerbation. - Continue prednisone  at 20 mg daily, try to taper back to baseline of 10mg  daily at follow-up - Continue Bactrim  MWF for PJP while on prednisone  dose >20mg  - Monitor oxygen  levels and adjust to 6 liters if needed during exertion - Await chest x-ray and lab results to guide further management - Continue supplemental oxygen  4-6 L to maintain O2 >88-90%   Type 2 diabetes mellitus without complications Blood sugars have been well-controlled at home, with occasional low readings in the morning. Recent hospitalization for GI bleed may have impacted glucose control. - Continue current diabetes management plan  Prophylactic treatment for Pneumocystis jirovecii pneumonia (PJP) due to chronic steroid use Currently on Bactrim  for PJP prophylaxis due to chronic steroid use. Discussion about discontinuing Bactrim  if prednisone  dose is reduced to baseline of 10 mg daily. - Continue Bactrim  for now - Will reassess need for Bactrim  at follow-up if prednisone  dose is reduced   I personally spent a total of 35 minutes in the care of the patient today including placing orders, independently interpreting results, communicating results, and coordinating care.   Almarie LELON Ferrari, NP 12/17/2023

## 2023-12-17 NOTE — Telephone Encounter (Signed)
 FYI Only or Action Required?: Action required by provider: update on patient condition.  Patient is followed in Pulmonology for COPd, last seen on 10/10/2023 by Hope Almarie ORN, NP.  Called Nurse Triage reporting Wheezing, Shortness of Breath, and Cough.  Symptoms began a week ago.  Interventions attempted: Prescription medications: prednisone , bactrim , Rescue inhaler, and Home oxygen  use.  Symptoms are: gradually worsening.  Triage Disposition: See HCP Within 4 Hours (Or PCP Triage)  Patient/caregiver understands and will follow disposition?: Yes  Copied from CRM #[id number]. Topic: Clinical - Red Word Triage >> Dec 17, 2023 10:11 AM Lavanda D wrote: Red Word that prompted transfer to Nurse Triage: Patient has been having some wheezing, unable to walk more than 5 steps without wheezing. Coughing up phlegm with a yellow tint. Whenever this happens she takes his pulse ox and it's around 70. She walks him through breathing exercises and is able to get him up to 90 pulse ox. Patient's daughter/caregiver is on the line. Reason for Disposition  [1] MILD difficulty breathing (e.g., minimal/no SOB at rest, SOB with walking, pulse < 100) AND [2] NEW-onset or WORSE than normal  Answer Assessment - Initial Assessment Questions Increased prednisone  yesterday to 20 mg   1. RESPIRATORY STATUS: Describe your breathing? (e.g., wheezing, shortness of breath, unable to speak, severe coughing)      Wheezing and SOB with minimal activity 2. ONSET: When did this breathing problem begin?      One week ago, Worsening over the past couple of days 3. PATTERN Does the difficult breathing come and go, or has it been constant since it started?      Significantly worsened with activity         6. CARDIAC HISTORY: Do you have any history of heart disease? (e.g., heart attack, angina, bypass surgery, angioplasty)      htn 7. LUNG HISTORY: Do you have any history of lung disease?  (e.g., pulmonary  embolus, asthma, emphysema)     COPD 8. CAUSE: What do you think is causing the breathing problem?      unsure 9. OTHER SYMPTOMS: Do you have any other symptoms? (e.g., chest pain, cough, dizziness, fever, runny nose)    Productive cough, wheezing when walking almost immediately 10. O2 SATURATION MONITOR:  Do you use an oxygen  saturation monitor (pulse oximeter) at home? If Yes, ask: What is your reading (oxygen  level) today? What is your usual oxygen  saturation reading? (e.g., 95%)       Baseline 96% at rest on 6L [place] Walking 90% when walking/wheezing  Protocols used: Breathing Difficulty-A-AH

## 2023-12-17 NOTE — Telephone Encounter (Signed)
 Pt scheduled acute ov 10/28.

## 2023-12-17 NOTE — Patient Instructions (Signed)
 CXR showed small to moderate right pleural effusion with associated passive atelectasis. Stable bandlike density in the right mid lung, attributable to prior radiation therapy.  Checking labs, if BNP elevated we will coordinate with cardiology about possibly increasing your diuretics    VISIT SUMMARY: Today, you were seen for increased shortness of breath, wheezing, and a productive cough. We discussed your history of COPD and lung cancer, and reviewed your recent symptoms and treatments. We also addressed your heart failure, chronic kidney disease, and diabetes management.  YOUR PLAN: -HEART FAILURE WITH SUSPECTED VOLUME OVERLOAD EXACERBATION: Heart failure means your heart is not pumping blood as well as it should, which can cause fluid buildup in your lungs and other parts of your body. We suspect this is contributing to your increased shortness of breath and wheezing. We have ordered blood work to check your heart and kidney function. Depending on the results, we may increase your diuretic medication to help remove excess fluid. If needed, we will consult a heart specialist or consider a procedure to remove fluid from your lungs.  -CHRONIC OBSTRUCTIVE PULMONARY DISEASE (COPD): COPD is a chronic lung disease that makes it hard to breathe. Your recent increase in symptoms may be due to fluid overload rather than a COPD flare-up. Continue taking prednisone  at 20 mg daily and monitor your oxygen  levels, increasing to 6 liters during activity if needed. We will review your chest x-ray and lab results to guide further treatment.  -CHRONIC KIDNEY DISEASE: Chronic kidney disease means your kidneys are not working as well as they should. This affects how we manage your heart failure and COPD. We will monitor your kidney function with blood work and adjust your diuretic medication based on the results.  -TYPE 2 DIABETES MELLITUS WITHOUT COMPLICATIONS: Type 2 diabetes means your body does not use insulin   properly, leading to high blood sugar levels. Your blood sugars have been well-controlled, but we will continue to monitor them, especially since your recent hospitalization may have affected your glucose levels. Continue with your current diabetes management plan.  -PROPHYLACTIC TREATMENT FOR PNEUMOCYSTIS JIROVECII PNEUMONIA (PJP): PJP is a type of lung infection that can occur in people with weakened immune systems, such as those on long-term steroids. You are currently taking Bactrim  to prevent this infection. We will continue this medication for now and reassess its necessity if your prednisone  dose is reduced.  INSTRUCTIONS: Please follow up with the recommended blood work to check your heart and kidney function. Continue taking your medications as prescribed and monitor your oxygen  levels. If your symptoms worsen or you experience any new symptoms, contact our office immediately.  Orders: Labs   Medication changes Start on prednisone  20mg  daily Continue Bactrim  MWF Continue lasix  40mg  daily until labs come back   Follow-up 2 weeks with Dr. Shelah, if no openings can use Beth's 1pm slot

## 2023-12-18 LAB — BRAIN NATRIURETIC PEPTIDE: Pro B Natriuretic peptide (BNP): 432 pg/mL — ABNORMAL HIGH (ref 0.0–100.0)

## 2023-12-19 ENCOUNTER — Ambulatory Visit: Payer: Self-pay | Admitting: Adult Health

## 2023-12-19 ENCOUNTER — Telehealth: Payer: Self-pay | Admitting: Cardiovascular Disease

## 2023-12-19 NOTE — Progress Notes (Signed)
 I called and spoke to pt's daughter. DPR. Pt daughter states the pt has not seen his cardiologist in a while but she is able to make him an appt to discuss this. I informed [name] that if she would keep us  updated so [name], NP would know what to do next. Pt daughter verbalized understanding. FYI to [facility] so she is aware.

## 2023-12-19 NOTE — Telephone Encounter (Signed)
 Np at [facility] pulmonary [phone] wanted to discuss lasix  dose per family. Pt daughter would like a c/b from a nurse to discuss

## 2023-12-20 ENCOUNTER — Telehealth: Payer: Self-pay | Admitting: Primary Care

## 2023-12-20 ENCOUNTER — Telehealth: Payer: Self-pay | Admitting: Gastroenterology

## 2023-12-20 DIAGNOSIS — N184 Chronic kidney disease, stage 4 (severe): Secondary | ICD-10-CM

## 2023-12-20 DIAGNOSIS — R0602 Shortness of breath: Secondary | ICD-10-CM

## 2023-12-20 DIAGNOSIS — J90 Pleural effusion, not elsewhere classified: Secondary | ICD-10-CM

## 2023-12-20 NOTE — Telephone Encounter (Signed)
 The patient was sent with a prescription for long-acting octreotide  20 mg IM monthly to try to treat recurrent bleeding from angiodysplasia.  I think we determined it was not possible to prescribe that through his insurance i.e. it was not covered and the family requested a prescription to take to the [facility].  So I think, to answer the nurses question is that it would be long-term and somebody needs to monitor his hemoglobin.  The patient is demented and the family did not want further sedation as they thought his cognitive function declined after sedated procedures previously.

## 2023-12-20 NOTE — Telephone Encounter (Signed)
 I spoke with patient's daughter. She has spoken with pulmonary this AM and reports patient does not need urgent cardiology appointment at this time.  Patient will plan to follow up with Delon Hoover, NP on 11/20 as scheduled.

## 2023-12-20 NOTE — Telephone Encounter (Signed)
 Patient is scheduled and Brian [name] is aware. Nothing further needed.

## 2023-12-20 NOTE — Telephone Encounter (Signed)
-----   Message from Orren LOISE Fabry sent at 12/19/2023  4:48 PM EDT ----- Regarding: RE: CHF exacerbation/ pleural effusion With his creatinine as high as it is and his age, I would refer to nephrology! I would consider thora if you think its needed.   Thanks Orren LOISE Fabry, PA-C ----- Message ----- From: Hope Almarie ORN, NP Sent: 12/18/2023   2:35 PM EDT To: Orren LOISE Fabry, PA-C Subject: CHF exacerbation/ pleural effusion             Hi Tessa, I am writing to you about a mutual patient we share. This is a patient of Dr. Verlin, who was last seen by you in 2024 for heart failure. I saw him in clinic for increasing dyspnea and wheezing symptoms. CXR showed small-moderate right pleural effusion, BNP was 432. Symptoms not felt to be related to his underlying COPD. He is taking lasix  40mg  daily. His creatinine is 2.5, would you recommend increasing his diuretic ?  Alternatively if we can not safely increase his diuretics we can consider thoracentesis but would prefer to manage medically if we can.  Can he set up a visit to see you guys here shortly?  Thanks, Landry NP

## 2023-12-20 NOTE — Telephone Encounter (Signed)
 Dr Legrand please see message and advise

## 2023-12-20 NOTE — Telephone Encounter (Signed)
Left message for patient's daughter to call office.

## 2023-12-20 NOTE — Telephone Encounter (Signed)
 Brian [name],  Can you please see the message from a provider at the [facility] questioning a plan for octreotide  on this patient?  I last saw him in July and a colonoscopy was done to treat bleeding right colon AVMs.  It looks like he was hospitalized again for this in September.  Was there a plan for him to go home and get IM LAR form of octreotide ? Who is managing that?  He previously saw Dr. Aneita, then it looks like you saw him in February 2024 inpatient consultation, I saw him this past July and you saw him last month.  HD

## 2023-12-20 NOTE — Telephone Encounter (Signed)
 Inbound call from [facility] NP named Damien is wanting to know what the patient treatment plan is seen he was in the hospital and prescribed Octretide and what the long time usage is of the injectable. Please advise.  FYI: [phone] [id number] and fax number is [phone]

## 2023-12-20 NOTE — Telephone Encounter (Signed)
 Spoke with patient's sister [name] regarding need for thoracentesis. Explained benefits and risk of procedure. He is not on a blood thinner. Patient has been contacted by cardiology and is an established patient with nephrology and will make an apt with them. Continue current dose of lasix  at 40mg  daily and we will set up thoracentesis.

## 2023-12-22 NOTE — Progress Notes (Signed)
 "  @Patient  ID: Brian [name], male    DOB: [birth year], 89 y.o.   MRN: [id number]  No chief complaint on file.   Referring provider: Geofm Glade PARAS, MD  HPI: 89 year old male, former smoker quit in 2005 (40 pack year hx). PMH significant for COPD, pleural effusion due to CHF, chronic hypoxemic respiratory.   Previous [facility] pulmonary encounter: 12/17/2023 Discussed the use of AI scribe software for clinical note transcription with the patient, who gave verbal consent to proceed.  History of Present Illness Brian [name] is an 89 year old male with COPD and lung cancer who presents with increased shortness of breath, wheezing, and productive cough. He primarily follows with Dr. Shelah for his COPD management.  He has a history of COPD and lung cancer, for which he received radiation therapy. In August, his oxygen  requirements decreased from six liters to four liters, and a prednisone  taper was initiated. Over the past week, he has experienced increased shortness of breath and wheezing, which has gradually worsened and occurs with minimal activity. He denies any significant cough or mucus production, sister did notice slight production with yellow mucus but does not sound significant per patient.   He was instructed to taper his prednisone  from twenty milligrams to fifteen milligrams daily for four weeks, then to ten milligrams daily for four weeks, and finally to five milligrams daily. However, he was only able to reduce to ten milligrams before his symptoms worsened. Recently, his prednisone  was increased back to twenty milligrams due to the exacerbation of symptoms.  He underwent a CT scan in September, which showed stable post-treatment changes in the right upper lobe with no evidence of local recurrence or metastatic disease, and a decrease in fluid in the right lung. He was hospitalized in September for a GI bleed and was in the emergency room for high glucose levels. He takes iron  daily  with vitamin C and his blood sugars have been stable, though occasionally low in the morning.  He is currently on forty milligrams of furosemide  daily, which was increased from an alternating dose of forty and twenty milligrams after his hospitalization in July for a bowel bleed. He also takes Bactrim  three times a week as prophylaxis while on prednisone , and Anoro once a day. His oxygen  is set at four liters, but it is increased to five liters during activities such as showering. He has a nebulizer at home. No recent fevers or contact with sick individuals.  CT chest in September 2025 showed stable post treatment changes right upper lobe. No evidence of local recurrence or metastatic disease. Interval decrease in size of chronic right pleural effusion    12/23/2023- interim hx  Discussed the use of AI scribe software for clinical note transcription with the patient, who gave verbal consent to proceed.  History of Present Illness Brian [name] is an 89 year old male with heart failure and kidney disease who presents with shortness of breath and pleural effusion. He is accompanied by his daughter, who is one of his primary caregivers.  He has been experiencing increased shortness of breath and variations in oxygen  needs, particularly with exertion. A small to moderate right-sided pleural effusion has been identified on chest imaging. Thoracentesis has been scheduled for 11/4.   His history of heart failure and chronic kidney disease are contributing to his current symptoms. Most recent BNP 432 on October 28th, 2025. His creatinine was 2.56. He is not on dialysis, and his nephrologist has  left the decision up to him. His kidney function has been declining, and he has noticed fluid retention, evidenced by an approximate eight-pound weight gain despite not eating much.  He is managing his diabetes with a Freestyle Libre and has been experiencing fluctuations in blood sugar levels, particularly spiking  at night. He was previously on aspirin  but was taken off due to lower bowel bleeds and is currently on actriocide, which has helped with the bleeding but exacerbated his diabetes. He is not on any blood thinners currently. Patient plans to follow-up with cardiology and nephrology.    Allergies  Allergen Reactions   Meperidine Hcl Swelling and Other (See Comments)    Tongue swelling Because of a history of documented adverse serious drug reaction, Medi Alert bracelet  is recommended.   Sertraline  Shortness Of Breath and Other (See Comments)    Burning sensation from Head to toes, diff breathing   Gabapentin  Other (See Comments)    Confusion    Memantine  Other (See Comments)    Can take only up to 5 mg a day- causes lethargy past that amount   Promethazine Swelling and Other (See Comments)    Tongue swells   Latex Rash and Other (See Comments)    Minor rash   Zolpidem Tartrate Other (See Comments)    Nightmares    Immunization History  Administered Date(s) Administered   Fluad Quad(high Dose 65+) 10/15/2018, 12/02/2019, 10/19/2020   Fluad Trivalent(High Dose 65+) 11/08/2022   INFLUENZA, HIGH DOSE SEASONAL PF 11/08/2015, 11/22/2017, 11/13/2021   Influenza Split 12/21/2010, 12/05/2011   Influenza Whole 01/01/2007, 12/09/2008, 11/24/2009   Influenza,inj,Quad PF,6+ Mos 11/20/2012, 10/29/2013, 11/23/2014   Influenza-Unspecified 12/11/2016, 10/21/2019, 10/19/2020   Moderna Covid-19 Fall Seasonal Vaccine 62yrs & older 04/26/2022   Moderna Covid-19 Vaccine Bivalent Booster 48yrs & up 11/17/2020   Moderna Sars-Covid-2 Vaccination 03/15/2019, 04/12/2019, 12/24/2019, 06/10/2020   Pneumococcal Conjugate-13 03/10/2014, 03/09/2015   Pneumococcal Polysaccharide-23 02/20/2008   Pneumococcal-Unspecified 10/21/2002, 04/28/2008   Td 07/16/2007, 04/25/2021   Td (Adult),5 Lf Tetanus Toxid, Preservative Free 04/25/2021   Tdap 02/26/2011   Zoster Recombinant(Shingrix) 10/03/2016, 12/02/2016    Zoster, Live 07/16/2007    Past Medical History:  Diagnosis Date   AAA (abdominal aortic aneurysm)    Arthritis    Asthma 1976   Bronchiolitis 06/2014   CAD (coronary artery disease)    NSTEMI 02/2010;  s/p CABG 1/12 (L-LAD, SOM1, S-PDA); echo in 08/2010: Mild LVH, EF 55%, grade 2 diastolic dysfunction, mild MR, mild LAE, mildly reduced RV function, mild RAE, PASP 42   Carpal tunnel syndrome of left wrist    Cecal angiodysplasia 04/04/2022   Colon polyps    COPD (chronic obstructive pulmonary disease) (HCC)    Diabetes mellitus 2000   Type II, controlls with diet   Dyspnea    ED (erectile dysfunction)    Gout    History of radiation therapy 03/09/2020-03/16/2020   SBRT right lung: Dr. Lynwood Nasuti   HTN (hypertension)    Hyperlipidemia    Inguinal hernia    Right   Macular degeneration    Myocardial infarction (HCC) 1 -13-2012   Neuropathy     Tobacco History: Social History   Tobacco Use  Smoking Status Former   Current packs/day: 0.00   Average packs/day: 1 pack/day for 40.0 years (40.0 ttl pk-yrs)   Types: Cigarettes   Start date: 02/20/1963   Quit date: 02/20/2003   Years since quitting: 20.8   Passive exposure: Past  Smokeless Tobacco Never  Counseling given: Not Answered   Outpatient Medications Prior to Visit  Medication Sig Dispense Refill   albuterol  (VENTOLIN  HFA) 108 (90 Base) MCG/ACT inhaler Inhale 2 puffs into the lungs every 6 (six) hours as needed for wheezing or shortness of breath. 18 g 4   ANORO ELLIPTA  62.5-25 MCG/ACT AEPB USE 1 INHALATION DAILY 180 each 3   Carboxymethylcellulose Sodium 0.25 % SOLN Place 1 drop into both eyes every 4 (four) hours.     Cholecalciferol  50 MCG (2000 UT) TABS Take 2,000 Units by mouth in the morning.     Continuous Glucose Receiver (DEXCOM G7 RECEIVER) DEVI UAD to monitor sugars  E11.22, N18.4 1 each 0   Continuous Glucose Sensor (DEXCOM G7 SENSOR) MISC UAD to monitor sugars - IDDM, E11.22, N18.4 3 each 4    cyanocobalamin  (VITAMIN B12) 500 MCG tablet Take 1,000 mcg by mouth daily.     empagliflozin  (JARDIANCE ) 10 MG TABS tablet Take 1 tablet (10 mg total) by mouth daily before breakfast. 30 tablet 5   escitalopram  (LEXAPRO ) 20 MG tablet Take 20 mg by mouth in the morning.     ferrous sulfate  325 (65 FE) MG tablet Take 1 tablet (325 mg total) by mouth daily with breakfast. 90 tablet 1   furosemide  (LASIX ) 40 MG tablet Take 1 tablet (40 mg total) by mouth daily. 30 tablet 0   insulin  glargine (LANTUS ) 100 UNIT/ML Solostar Pen Inject 14 Units into the skin daily. Inject 14 Units into the skin daily. 15 mL 2   insulin  lispro (HUMALOG ) 100 UNIT/ML injection Inject 2-4 units into the skin 3 times a day before meals.  < 150  0 units, 151-200  2 units,  201-250  3 units, >250    4 units 10 mL 11   Insulin  Pen Needle 32G X 6 MM MISC UAD for daily lantus  injections and humalog  injections tid prn 100 each 2   memantine  (NAMENDA ) 10 MG tablet Take 5 mg by mouth in the morning and at bedtime.     mirtazapine  (REMERON ) 15 MG tablet Take 0.5 tablets (7.5 mg total) by mouth at bedtime.     octreotide  (SANDOSTATIN  LAR) 20 MG injection Inject 20 mg into the muscle every 30 (thirty) days. 1 each 0   OXYGEN  Inhale 6 L/min into the lungs continuous.     pantoprazole  (PROTONIX ) 40 MG tablet Take 1 tablet (40 mg total) by mouth daily. 90 tablet 1   predniSONE  (DELTASONE ) 10 MG tablet Take 1 tablet (10 mg total) by mouth daily with breakfast. Take 1.5 tablets (15mg  total) daily with breakfast     simvastatin  (ZOCOR ) 40 MG tablet Take 1 tablet (40 mg total) by mouth daily. 90 tablet 3   sulfamethoxazole -trimethoprim  (BACTRIM  DS) 800-160 MG tablet Take 1 tablet by mouth 3 (three) times a week. 12 tablet 5   No facility-administered medications prior to visit.    Review of Systems  Review of Systems  Constitutional:  Positive for unexpected weight change.  Respiratory:  Positive for shortness of breath.    Physical  Exam  There were no vitals taken for this visit. Physical Exam Constitutional:      General: He is not in acute distress.    Appearance: Normal appearance. He is well-developed. He is not ill-appearing.  HENT:     Head: Normocephalic and atraumatic.     Mouth/Throat:     Mouth: Mucous membranes are moist.     Pharynx: Oropharynx is clear.  Cardiovascular:  Rate and Rhythm: Normal rate and regular rhythm.     Heart sounds: Normal heart sounds.  Pulmonary:     Effort: Pulmonary effort is normal. No respiratory distress.     Breath sounds: Normal breath sounds. No wheezing or rhonchi.     Comments: CTA; supplemental oxygen   Musculoskeletal:        General: Normal range of motion.     Cervical back: Normal range of motion and neck supple.  Skin:    General: Skin is warm and dry.     Findings: No erythema or rash.  Neurological:     General: No focal deficit present.     Mental Status: He is alert and oriented to person, place, and time. Mental status is at baseline.  Psychiatric:        Mood and Affect: Mood normal.        Behavior: Behavior normal.        Thought Content: Thought content normal.        Judgment: Judgment normal.     Lab Results:  CBC    Component Value Date/Time   WBC 8.1 12/17/2023 1521   RBC 3.25 (L) 12/17/2023 1521   HGB 9.7 (L) 12/17/2023 1521   HCT 30.5 (L) 12/17/2023 1521   PLT 170.0 12/17/2023 1521   MCV 93.7 12/17/2023 1521   MCH 29.5 10/30/2023 0508   MCHC 31.8 12/17/2023 1521   RDW 16.7 (H) 12/17/2023 1521   LYMPHSABS 0.5 (L) 12/17/2023 1521   MONOABS 0.3 12/17/2023 1521   EOSABS 0.1 12/17/2023 1521   BASOSABS 0.0 12/17/2023 1521    BMET    Component Value Date/Time   NA 140 12/17/2023 1521   NA 143 10/10/2021 1054   K 5.7 No hemolysis seen (H) 12/17/2023 1521   CL 103 12/17/2023 1521   CO2 29 12/17/2023 1521   GLUCOSE 258 (H) 12/17/2023 1521   BUN 42 (H) 12/17/2023 1521   BUN 28 (H) 10/10/2021 1054   CREATININE 2.56 (H)  12/17/2023 1521   CREATININE 1.58 (H) 10/15/2019 1117   CALCIUM 8.7 12/17/2023 1521   CALCIUM 9.0 03/16/2016 0000   GFRNONAA 20 (L) 11/09/2023 0048   GFRNONAA 39 (L) 10/15/2019 1117   GFRAA 46 (L) 10/15/2019 1117    BNP    Component Value Date/Time   BNP 576.0 (H) 08/27/2023 2206    ProBNP    Component Value Date/Time   PROBNP 432.0 (H) 12/17/2023 1544    Imaging: DG Chest 2 View Result Date: 12/17/2023 EXAM: 2 VIEW(S) XRAY OF THE CHEST 12/17/2023 01:04:53 PM COMPARISON: 10/29/2023. CLINICAL HISTORY: Acute bronchitis. FINDINGS: LUNGS AND PLEURA: Small to moderate right pleural effusion with associated passive atelectasis. Stable bandlike density in the right mid lung, possibly some fluid along the minor fissure or due to scarring. Emphysema noted. No pulmonary edema. No pneumothorax. HEART AND MEDIASTINUM: Atheromatous vascular calcification of the thoracic aorta. Prior CABG. Abdominal aortic stent noted. BONES AND SOFT TISSUES: Thoracic spondylosis. No acute osseous abnormality. IMPRESSION: 1. Small to moderate right pleural effusion with associated passive atelectasis. 2. Stable bandlike density in the right mid lung, attributable to prior radiation therapy. 3. Emphysema. Aortic atherosclerosis. 4. Thoracic spondylosis. Electronically signed by: Ryan Salvage MD 12/17/2023 01:32 PM EDT RP Workstation: [id number]     Assessment & Plan:    1. COPD without exacerbation (HCC) (Primary)  2. Asthma, unspecified asthma severity, unspecified whether complicated, unspecified whether persistent  3. Chronic hypoxic respiratory failure (HCC)   Assessment and Plan  Assessment & Plan Right pleural effusion  Small to moderate right-sided pleural effusion likely due to heart failure and chronic kidney disease. Symptoms include increased dyspnea and variable oxygen  needs. Differential diagnosis includes infection, malignancy, or heart failure, with heart failure being the most likely  cause. - Scheduled thoracentesis for diagnostic evaluation and therapeutic relief of pleural effusion. - Will perform ultrasound-guided thoracentesis in interventional radiology on 11/4. - Will obtain post-procedure chest x-ray. - Will follow up with cardiologist and nephrologist for ongoing management. - Reviewed risk of thoracentesis including pneumothorax 5-10% (lower with ultrasound guidance), bleeding, infection, organ puncture.  Chronic kidney disease stage 4 Contributing to pleural effusion. No current dialysis. Discussion with nephrologist regarding potential benefits of dialysis for fluid retention and symptom management. He is aware of the pros and cons and will make the final decision. - Discuss potential benefits of dialysis with nephrologist.  Heart failure Symptoms include weight gain and fluid retention. Management includes diuretics and coordination with cardiologist. - Continue lasix  40mg  daily  - Follow up with cardiologist to manage diuretics and assess heart failure status.  Chronic respiratory failure with hypoxia Chronic respiratory failure with hypoxia. No current use of blood thinners due to previous lower bowel bleeds. Thoracentesis planned to alleviate symptoms related to pleural effusion. - Proceed with thoracentesis to alleviate symptoms related to pleural effusion. - Continue supplemental oxygen  @4  L to maintain O2 >88-90%   Type 2 diabetes mellitus Type 2 diabetes mellitus, exacerbated by medication changes. Blood glucose levels fluctuate, with spikes at night and drops in the morning. Managed with [facility] monitoring. - Continue monitoring blood glucose levels with Freestyle Libre. - Continue Jardiance , Humalog  and Lantus    Chronic obstructive pulmonary disease (COPD) COPD not suspected to be the cause of current pleural effusion. Prednisone  use may impact kidney function. - Continue Anoro Ellipta  twp puffs daily  - Taper prednisone  to 10 mg to  minimize impact on kidney function - No longer need to be on Bactrim  for PJP since prednisone  dose 20mg     Almarie LELON Ferrari, NP 12/22/2023 "

## 2023-12-22 NOTE — Telephone Encounter (Signed)
 Patty,  Please see reply from Dr. Avram.  This patient has had recurrent colon AVM bleeding and anemia leading to hospitalizations in July and September of this year.  The recommendation is for the long-acting (LAR) form of octreotide  as a 20 mg intramuscular injection monthly indefinitely.  [facility] not [facility] not [facility] and will manage that, since reportedly Medicare would not cover it.  If he has not yet had any blood work at the [facility] since that hospital discharge, then he should have a CBC and iron  studies at our office in the next 2 weeks.  VEAR Brand MD

## 2023-12-23 ENCOUNTER — Encounter: Payer: Self-pay | Admitting: Primary Care

## 2023-12-23 ENCOUNTER — Ambulatory Visit: Admitting: Primary Care

## 2023-12-23 VITALS — BP 144/70 | HR 70 | Temp 98.1°F | Ht 70.0 in | Wt 160.6 lb

## 2023-12-23 DIAGNOSIS — I509 Heart failure, unspecified: Secondary | ICD-10-CM

## 2023-12-23 DIAGNOSIS — J449 Chronic obstructive pulmonary disease, unspecified: Secondary | ICD-10-CM

## 2023-12-23 DIAGNOSIS — J90 Pleural effusion, not elsewhere classified: Secondary | ICD-10-CM

## 2023-12-23 DIAGNOSIS — Z87891 Personal history of nicotine dependence: Secondary | ICD-10-CM

## 2023-12-23 DIAGNOSIS — J45909 Unspecified asthma, uncomplicated: Secondary | ICD-10-CM | POA: Diagnosis not present

## 2023-12-23 DIAGNOSIS — E119 Type 2 diabetes mellitus without complications: Secondary | ICD-10-CM

## 2023-12-23 DIAGNOSIS — J9611 Chronic respiratory failure with hypoxia: Secondary | ICD-10-CM | POA: Diagnosis not present

## 2023-12-23 DIAGNOSIS — N184 Chronic kidney disease, stage 4 (severe): Secondary | ICD-10-CM

## 2023-12-23 MED ORDER — PREDNISONE 10 MG PO TABS: 10.0000 mg | ORAL_TABLET | Freq: Every day | ORAL | 2 refills | Status: DC

## 2023-12-23 NOTE — Telephone Encounter (Signed)
 Attempted to call and line rings no answer no voice mail

## 2023-12-23 NOTE — Patient Instructions (Addendum)
  VISIT SUMMARY: Today, we discussed your increased shortness of breath and identified a small to moderate right-sided pleural effusion, likely due to your heart failure and chronic kidney disease. We also reviewed your chronic kidney disease, heart failure, chronic respiratory failure, type 2 diabetes, and COPD. Your daughter was present during the visit.  YOUR PLAN: -RIGHT PLEURAL EFFUSION: A pleural effusion is a buildup of fluid between the tissues that line the lungs and the chest. This is likely due to your heart failure and chronic kidney disease. We have scheduled a thoracentesis, a procedure to remove the fluid, which will be done with ultrasound guidance in interventional radiology. A chest x-ray will be done after the procedure. Follow up with your cardiologist and nephrologist for ongoing management.  -CHRONIC KIDNEY DISEASE STAGE 4: Chronic kidney disease is a long-term condition where the kidneys do not work as well as they should. It is contributing to your pleural effusion. We discussed the potential benefits of dialysis for managing fluid retention and symptoms. Please discuss this further with your nephrologist.  -HEART FAILURE: Heart failure means your heart is not pumping blood as well as it should. This is contributing to your pleural effusion and fluid retention. We will manage this with diuretics and you should follow up with your cardiologist to assess your heart failure status.  -CHRONIC RESPIRATORY FAILURE WITH HYPOXIA: Chronic respiratory failure with hypoxia means your lungs are not getting enough oxygen  into your blood. This condition is contributing to your symptoms. We will proceed with the thoracentesis to help alleviate your symptoms. Continue supplemental oxygen  to maintain O2 >88-90%.   -TYPE 2 DIABETES MELLITUS: Type 2 diabetes is a condition where your body does not use insulin  properly, leading to high blood sugar levels. Your blood glucose levels have been  fluctuating, especially at night. Continue to monitor your blood glucose levels with the Freestyle Libre.  -CHRONIC OBSTRUCTIVE PULMONARY DISEASE (COPD): COPD is a chronic inflammatory lung disease that causes obstructed airflow from the lungs. It is not suspected to be the cause of your current pleural effusion. Continue Anoro. We will taper your prednisone  to 10 mg to minimize its impact on your kidney function.  INSTRUCTIONS: Please follow up with your cardiologist and nephrologist for ongoing management of your conditions. Discuss the potential benefits of dialysis with your nephrologist. Continue monitoring your blood glucose levels with the Freestyle Libre. A thoracentesis has been scheduled to remove the fluid from your pleural effusion, and a chest x-ray will be done after the procedure.  Follow-up 2-4 weeks with Dr. Shelah, if no openings can see [facility]

## 2023-12-23 NOTE — Telephone Encounter (Signed)
 Left message on machine to call back

## 2023-12-24 ENCOUNTER — Other Ambulatory Visit: Payer: Self-pay | Admitting: Primary Care

## 2023-12-24 ENCOUNTER — Ambulatory Visit (HOSPITAL_COMMUNITY)
Admission: RE | Admit: 2023-12-24 | Discharge: 2023-12-24 | Disposition: A | Discharge status: Home / Self Care | Source: Ambulatory Visit | Attending: Primary Care | Admitting: Primary Care

## 2023-12-24 DIAGNOSIS — J90 Pleural effusion, not elsewhere classified: Secondary | ICD-10-CM

## 2023-12-24 DIAGNOSIS — R0602 Shortness of breath: Secondary | ICD-10-CM | POA: Insufficient documentation

## 2023-12-24 DIAGNOSIS — N184 Chronic kidney disease, stage 4 (severe): Secondary | ICD-10-CM

## 2023-12-24 MED ORDER — LIDOCAINE-EPINEPHRINE 1 %-1:100000 IJ SOLN
INTRAMUSCULAR | Status: AC
  Filled 2023-12-24: qty 1

## 2023-12-24 NOTE — Progress Notes (Signed)
 Patient with history of COPD, lung cancer s/p radiation, CHF, CKD with chronic small right pleural effusion. Recently seen by pulmonary for increased dyspnea and recommendation made for possible thoracentesis in IR.  Limited chest US  shows scant pleural fluid on the right which is not amenable to thoracentesis. No pleural fluid on the left.  No procedure performed, imaging reviewed with patient and family member. Images available for review under imaging section of Epic.  Brian Hesselbach, PA-C

## 2023-12-24 NOTE — Telephone Encounter (Signed)
 Spoke with Brian [name] at the [facility] and gave the recommendation per Dr Avram and Legrand.  I will fax notes to [phone] as requested to attention Carla/Emily

## 2023-12-25 ENCOUNTER — Telehealth: Payer: Self-pay | Admitting: *Deleted

## 2023-12-25 NOTE — Progress Notes (Unsigned)
 Complex Care Management Care Guide Note  12/25/2023 Name: [name] MRN: [id number] DOB: 10-28-1931  Brian [name] is a 89 y.o. year old male who is a primary care patient of Geofm, Glade PARAS, MD and is actively engaged with the care management team. I reached out to Brian [name] by phone today to assist with re-scheduling  with the RN Case Manager.  Follow up plan: Unsuccessful telephone outreach attempt made. A HIPAA compliant phone message was left for the patient providing contact information and requesting a return call.  Thedford Franks, CMA [place]  [facility], [facility] [place] Guide Direct Dial: [phone]  Fax: [phone] Website: [email or web address]

## 2023-12-26 NOTE — Progress Notes (Signed)
 Complex Care Management Care Guide Note  12/26/2023 Name: Brian [name] MRN: [id number] DOB: September 30, 1931  Brian [name] is a 89 y.o. year old male who is a primary care patient of Geofm, Glade PARAS, MD and is actively engaged with the care management team. I reached out to Brian [name] by phone today to assist with re-scheduling  with the RN Case Manager.  Follow up plan: Telephone appointment with complex care management team member scheduled for:  01/10/2024  Thedford Franks, CMA   [facility], [facility] Guide Direct Dial: [phone]  Fax: [phone] Website: [email or web address]

## 2024-01-01 ENCOUNTER — Encounter: Payer: Self-pay | Admitting: Internal Medicine

## 2024-01-08 ENCOUNTER — Encounter: Payer: Self-pay | Admitting: Emergency Medicine

## 2024-01-08 DIAGNOSIS — J449 Chronic obstructive pulmonary disease, unspecified: Secondary | ICD-10-CM

## 2024-01-08 NOTE — Progress Notes (Signed)
 Cardiology Office Note   Date:  01/09/2024  ID:  Brian [name], DOB Jun 16, 1931, MRN [id number] PCP: Geofm Glade PARAS, MD  [facility] Providers Cardiologist:  Lonni Cash, MD     History of Present Illness Brian [name] is a 89 y.o. male with a past medical history of CAD s/p CABG, combined heart failure, hypertension, dyslipidemia, DM2, COPD--follows with [facility] on O2, lung cancer treated with XRT, CKD stage IV, AAA s/p EVAR in 2020, history of AV block while on beta-blocker.  08/28/2023 echo EF 60 to 65%, severe LVH of the basal septal segment, mild MR, mild aortic valve stenosis 09/13/2021 echo EF 60 to 65%, severe asymmetric LVH, grade 1 DD, mildly elevated PASP  In 2012 he was admitted with a non-STEMI left heart cath revealed severe three-vessel CAD>>  >> ultimately underwent CABG x 3 on 03/03/2010.  In 2020 he underwent EVAR for 5.5 cm AAA.Evaluated by Dr. Fernande at some point after he had type I AV block while on a beta-blocker and was not felt that PPM was indicated at that time since beta-blocker therapy had been discontinued. Evaluated by Dr. Cash on 12/27/2021 overall stable but now requiring continuous oxygen , he showed signs of being volume overloaded so his Lasix  was resumed for a few days and then he was advised to take on a as needed basis with plans to follow-up in 6 months.  Most recently evaluated by Orren Fabry, PA in November 2024, blood pressure was high in the office but his daughter reported it was stable at home, overall no changes were made and he was advised he can follow-up in 3 months.  He presents today accompanied by his daughter and his son. He is doing ok, his daughter lives with him and his caregiver and takes excellent care of him. He was evaluated by pulmonology, some discussion about a thoracentesis but fluid level was not felt to be enough to warrant. He is short of breath, on O2 chronically, and he appears comfortable in the office  today--nods off during most of the evaluation. No pedal edema, abdomen is soft, but at times family states it is firm, also decreased appetite and increased somnolence. His children want to make sure he is comfortable but treat anything that is reasonable.  We briefly touched on additional help in the home however Mr. Montini quickly perks up and says he does not need any additional help at this time.  He denies chest pain, palpitations,  pnd, orthopnea, n, v, dizziness, syncope, edema, weight gain, or early satiety.   ROS: Review of Systems  Constitutional:        Decreased interest in food but no weight loss  Respiratory:  Positive for shortness of breath.   Psychiatric/Behavioral:  Positive for memory loss.   All other systems reviewed and are negative.    Studies Reviewed      Cardiac Studies & Procedures   ______________________________________________________________________________________________     ECHOCARDIOGRAM  ECHOCARDIOGRAM COMPLETE 08/28/2023  Narrative ECHOCARDIOGRAM REPORT    Patient Name:   Brian [name] Date of Exam: 08/28/2023 Medical Rec #:  [id number]       Height:       71.0 in Accession #:    [id number]      Weight:       167.5 lb Date of Birth:  December 27, 1931      BSA:          1.956 m Patient Age:    60 years  BP:           145/61 mmHg Patient Gender: M               HR:           68 bpm. Exam Location:  Inpatient  Procedure: 2D Echo, Cardiac Doppler and Color Doppler (Both Spectral and Color Flow Doppler were utilized during procedure).  Indications:    I50.40* Unspecified combined systolic (congestive) and diastolic (congestive) heart failure  History:        Patient has prior history of Echocardiogram examinations, most recent 09/13/2021. CAD, Abnormal ECG, Arrythmias:SVT. AV block; Risk Factors:Hypertension and Diabetes.  Sonographer:    Ellouise Mose RDCS Sonographer#2:  Juliene Rucks Referring Phys: [id number] [facility]   Sonographer  Comments: Technically difficult study due to poor echo windows. Patient had trouble staying in left decubitus position. IMPRESSIONS   1. Left ventricular ejection fraction, by estimation, is 60 to 65%. The left ventricle has normal function. The left ventricle has no regional wall motion abnormalities. There is severe left ventricular hypertrophy of the basal-septal segment. Left ventricular diastolic parameters were normal. 2. Right ventricular systolic function is normal. The right ventricular size is normal. 3. The mitral valve is degenerative. Mild mitral valve regurgitation. No evidence of mitral stenosis. There is mild late systolic prolapse of posterior leaflet of the mitral valve. 4. The aortic valve is tricuspid. Aortic valve regurgitation is trivial. Mild aortic valve stenosis.  FINDINGS Left Ventricle: Left ventricular ejection fraction, by estimation, is 60 to 65%. The left ventricle has normal function. The left ventricle has no regional wall motion abnormalities. The left ventricular internal cavity size was normal in size. There is severe left ventricular hypertrophy of the basal-septal segment. Left ventricular diastolic parameters were normal.  Right Ventricle: The right ventricular size is normal. Right ventricular systolic function is normal.  Left Atrium: Left atrial size was normal in size.  Right Atrium: Right atrial size was normal in size.  Pericardium: There is no evidence of pericardial effusion.  Mitral Valve: The mitral valve is degenerative in appearance. There is mild late systolic prolapse of posterior leaflet of the mitral valve. Mild mitral annular calcification. Mild mitral valve regurgitation. No evidence of mitral valve stenosis. MV peak gradient, 6.8 mmHg. The mean mitral valve gradient is 2.5 mmHg.  Tricuspid Valve: The tricuspid valve is grossly normal. Tricuspid valve regurgitation is mild . No evidence of tricuspid stenosis.  Aortic Valve: The  aortic valve is tricuspid. Aortic valve regurgitation is trivial. Mild aortic stenosis is present. Aortic valve mean gradient measures 10.5 mmHg. Aortic valve peak gradient measures 20.8 mmHg.  Pulmonic Valve: The pulmonic valve was grossly normal. Pulmonic valve regurgitation is trivial. No evidence of pulmonic stenosis.  Aorta: The aortic root and ascending aorta are structurally normal, with no evidence of dilitation.  IAS/Shunts: No atrial level shunt detected by color flow Doppler.   LEFT VENTRICLE PLAX 2D LVIDd:         4.50 cm      Diastology LVIDs:         3.20 cm      LV e' medial:    8.08 cm/s LV PW:         1.10 cm      LV E/e' medial:  11.9 LV IVS:        1.30 cm      LV e' lateral:   10.40 cm/s LVOT diam:     2.00 cm  LV E/e' lateral: 9.3 LV SV:         64 LV SV Index:   33 LVOT Area:     3.14 cm  LV Volumes (MOD) LV vol d, MOD A2C: 101.0 ml LV vol d, MOD A4C: 111.1 ml LV vol s, MOD A2C: 33.4 ml LV vol s, MOD A4C: 46.2 ml LV SV MOD A2C:     67.6 ml LV SV MOD A4C:     111.1 ml LV SV MOD BP:      68.4 ml  RIGHT VENTRICLE             IVC RV S prime:     10.60 cm/s  IVC diam: 1.60 cm TAPSE (M-mode): 1.5 cm  LEFT ATRIUM             Index        RIGHT ATRIUM           Index LA diam:        2.30 cm 1.18 cm/m   RA Area:     17.80 cm LA Vol (A2C):   28.0 ml 14.32 ml/m  RA Volume:   46.70 ml  23.88 ml/m LA Vol (A4C):   13.3 ml 6.80 ml/m LA Biplane Vol: 19.9 ml 10.18 ml/m AORTIC VALVE AV Area (Vmax):    1.45 cm AV Area (Vmean):   1.35 cm AV Area (VTI):     1.32 cm AV Vmax:           228.00 cm/s AV Vmean:          153.500 cm/s AV VTI:            0.484 m AV Peak Grad:      20.8 mmHg AV Mean Grad:      10.5 mmHg LVOT Vmax:         105.00 cm/s LVOT Vmean:        66.200 cm/s LVOT VTI:          0.204 m LVOT/AV VTI ratio: 0.42  AORTA Ao Root diam: 3.30 cm Ao Asc diam:  3.55 cm  MITRAL VALVE MV Area (PHT): 2.46 cm     SHUNTS MV Area VTI:   2.09 cm      Systemic VTI:  0.20 m MV Peak grad:  6.8 mmHg     Systemic Diam: 2.00 cm MV Mean grad:  2.5 mmHg MV Vmax:       1.31 m/s MV Vmean:      74.4 cm/s MV Decel Time: 309 msec MV E velocity: 96.40 cm/s MV A velocity: 129.00 cm/s MV E/A ratio:  0.75  Redell Shallow MD Electronically signed by Redell Shallow MD Signature Date/Time: 08/28/2023/4:32:02 PM    Final          ______________________________________________________________________________________________      Risk Assessment/Calculations           Physical Exam VS:  BP 136/78   Pulse 72   Ht 5' 10 (1.778 m)   Wt 159 lb (72.1 kg)   SpO2 97%   BMI 22.81 kg/m        Wt Readings from Last 3 Encounters:  01/09/24 159 lb (72.1 kg)  12/23/23 160 lb 9.6 oz (72.8 kg)  12/17/23 165 lb 6.4 oz (75 kg)    GEN: Well cared for in a wheelchair,  no acute distress NECK: No JVD; No carotid bruits CARDIAC: RRR, no murmurs, rubs, gallops RESPIRATORY:  fine crackles but no rales ABDOMEN: Soft, non-tender, non-distended  EXTREMITIES:  No edema; No deformity   ASSESSMENT AND PLAN CAD - s/p CABG x 3. Stable with no anginal symptoms. No indication for ischemic evaluation.  Not currently on Plavix as he has a history of a GI bleed.  COPD-on chronic oxygen .  Follows with [facility], some discussion about possible thoracentesis however accumulation of fluid is not felt to be significant enough.  HFpEF-echo in July revealed a preserved EF, BNP was elevated a few weeks ago.  Will check proBNP, c-Met, see if there is any room to titrate any of his medications.  His family understands that it is a fine balance between supporting his heart and not stressing out his kidneys.  Currently on Lasix  40 mg daily, Jardiance  10 mg daily.  CKD stage IV-follows with Dr. Gearline.  Will check BMET.  Hypertension-blood pressure is elevated in the office today, daughter states it typically is at home his blood pressure is well-controlled.        Dispo: BMET, proBNP, follow-up in 3 months.  Signed, Delon JAYSON Hoover, NP

## 2024-01-09 ENCOUNTER — Ambulatory Visit: Attending: Cardiology | Admitting: Cardiology

## 2024-01-09 VITALS — BP 136/78 | HR 72 | Ht 70.0 in | Wt 159.0 lb

## 2024-01-09 DIAGNOSIS — I714 Abdominal aortic aneurysm, without rupture, unspecified: Secondary | ICD-10-CM

## 2024-01-09 DIAGNOSIS — I2510 Atherosclerotic heart disease of native coronary artery without angina pectoris: Secondary | ICD-10-CM

## 2024-01-09 DIAGNOSIS — I441 Atrioventricular block, second degree: Secondary | ICD-10-CM | POA: Diagnosis not present

## 2024-01-09 DIAGNOSIS — I10 Essential (primary) hypertension: Secondary | ICD-10-CM

## 2024-01-09 DIAGNOSIS — J449 Chronic obstructive pulmonary disease, unspecified: Secondary | ICD-10-CM

## 2024-01-09 DIAGNOSIS — N184 Chronic kidney disease, stage 4 (severe): Secondary | ICD-10-CM

## 2024-01-09 NOTE — Patient Instructions (Addendum)
 Medication Instructions:   Your physician recommends that you continue on your current medications as directed. Please refer to the Current Medication list given to you today.  *If you need a refill on your cardiac medications before your next appointment, please call your pharmacy*   Lab Work:   PLEASE GO DOWN STAIRS  [facility]  FIRST FLOOR   ( GET OFF ELEVATORS WALK TOWARDS WAITING AREA LAB LOCATED BY PHARMACY):  BNP AND C,MET TODAY       If you have labs (blood work) drawn today and your tests are completely normal, you will receive your results only by: MyChart Message (if you have MyChart) OR A paper copy in the mail If you have any lab test that is abnormal or we need to change your treatment, we will call you to review the results.  Testing/Procedures: NONE ORDERED  TODAY    Follow-Up: At [facility], you and your health needs are our priority.  As part of our continuing mission to provide you with exceptional heart care, our providers are all part of one team.  This team includes your primary Cardiologist (physician) and Advanced Practice Providers or APPs (Physician Assistants and Nurse Practitioners) who all work together to provide you with the care you need, when you need it.  Your next appointment:  3 month(s)  Provider:   Lonni Cash, MD    We recommend signing up for the patient portal called MyChart.  Sign up information is provided on this After Visit Summary.  MyChart is used to connect with patients for Virtual Visits (Telemedicine).  Patients are able to view lab/test results, encounter notes, upcoming appointments, etc.  Non-urgent messages can be sent to your provider as well.   To learn more about what you can do with MyChart, go to [email or web address].   Other Instructions

## 2024-01-10 ENCOUNTER — Telehealth: Payer: Self-pay

## 2024-01-10 LAB — COMPREHENSIVE METABOLIC PANEL WITH GFR
ALT: 12 IU/L (ref 0–44)
AST: 9 IU/L (ref 0–40)
Albumin: 3.8 g/dL (ref 3.7–4.7)
Alkaline Phosphatase: 77 IU/L (ref 48–129)
BUN/Creatinine Ratio: 16 (ref 10–24)
BUN: 52 mg/dL — ABNORMAL HIGH (ref 8–27)
Bilirubin Total: 0.3 mg/dL (ref 0.0–1.2)
CO2: 27 mmol/L (ref 20–29)
Calcium: 9.2 mg/dL (ref 8.6–10.2)
Chloride: 99 mmol/L (ref 96–106)
Creatinine, Ser: 3.28 mg/dL — ABNORMAL HIGH (ref 0.76–1.27)
Globulin, Total: 2.5 g/dL (ref 1.5–4.5)
Glucose: 154 mg/dL — ABNORMAL HIGH (ref 70–99)
Potassium: 5.3 mmol/L — ABNORMAL HIGH (ref 3.5–5.2)
Sodium: 141 mmol/L (ref 134–144)
Total Protein: 6.3 g/dL (ref 6.0–8.5)
eGFR: 17 mL/min/1.73 — ABNORMAL LOW

## 2024-01-10 LAB — PRO B NATRIURETIC PEPTIDE: NT-Pro BNP: 2134 pg/mL — ABNORMAL HIGH (ref 0–486)

## 2024-01-13 ENCOUNTER — Ambulatory Visit: Payer: Self-pay | Admitting: Cardiology

## 2024-01-13 ENCOUNTER — Other Ambulatory Visit: Payer: Self-pay | Admitting: Cardiovascular Disease

## 2024-01-20 MED ORDER — FUROSEMIDE 40 MG PO TABS: 40.0000 mg | ORAL_TABLET | Freq: Every day | ORAL | 3 refills | Status: AC

## 2024-01-21 ENCOUNTER — Other Ambulatory Visit: Payer: Self-pay | Admitting: Cardiovascular Disease

## 2024-01-22 ENCOUNTER — Ambulatory Visit: Admitting: Emergency Medicine

## 2024-01-22 ENCOUNTER — Telehealth: Payer: Self-pay | Admitting: Primary Care

## 2024-01-22 ENCOUNTER — Other Ambulatory Visit: Payer: Self-pay | Admitting: *Deleted

## 2024-01-22 MED ORDER — PREDNISONE 10 MG PO TABS: ORAL_TABLET | ORAL | 2 refills | Status: AC

## 2024-01-22 NOTE — Telephone Encounter (Signed)
 I called and spoke with patient's daughter, [name] [facility]), advised her of recommendations per [facility].  She said she did not know if what was sent into the pharmacy previously would be enough with the change in dosage.  I advised her I would send in a new prescription with refills.  She verbalized understanding.  Nothing further needed.

## 2024-01-22 NOTE — Telephone Encounter (Signed)
 Patient daughter is asking for a switch of prednisone  to 20mg  as he does better on it please advise if okay to send it in

## 2024-01-22 NOTE — Telephone Encounter (Signed)
 Lets try alternative 10mg  and 15mg  every other day and see if he does ok on that

## 2024-01-22 NOTE — Telephone Encounter (Signed)
 Copied from CRM #[id number]. Topic: Clinical - Medication Refill >> Jan 22, 2024  8:24 AM Ismael A wrote: Medication: predniSONE  (DELTASONE ) 10 MG table   PATIENT'S DAUGHTER IS REQUESTING 20MG  STATING THE PT DOES BETTER ON IT INSTEAD OF 10MG  - daughter [name] gave # [phone]   Has the patient contacted their pharmacy? Yes (Agent: If no, request that the patient contact the pharmacy for the refill. If patient does not wish to contact the pharmacy document the reason why and proceed with request.) (Agent: If yes, when and what did the pharmacy advise?)  This is the patient's preferred pharmacy:  [id number] - [place], [place] - [address] AT [facility] [address]  [place] [address] Phone: [phone] Fax: [phone]  Is this the correct pharmacy for this prescription? Yes If no, delete pharmacy and type the correct one.   Has the prescription been filled recently? No  Is the patient out of the medication? Yes   Has the patient been seen for an appointment in the last year OR does the patient have an upcoming appointment? Yes  Can we respond through MyChart? Yes  Agent: Please be advised that Rx refills may take up to 3 business days. We ask that you follow-up with your pharmacy.

## 2024-01-23 ENCOUNTER — Other Ambulatory Visit: Payer: Self-pay

## 2024-01-23 NOTE — Telephone Encounter (Signed)
 Has been handled in [facility] encounter

## 2024-01-23 NOTE — Patient Instructions (Signed)
 Visit Information  Thank you for taking time to visit with me today. Please don't hesitate to contact me if I can be of assistance to you before our next scheduled appointment.  Our next appointment is by telephone on 02/04/2024 at 2 pm. Please call the care guide team at [phone] if you need to cancel or reschedule your appointment.   Following is a copy of your care plan:   Goals Addressed             This Visit's Progress    VBCI RN Care Plan       Problems:  Chronic Disease Management support and education needs related to COPD and DMII  Goal: Over the next 90 days the patient/caregiver will continue to work with RN Care Manager and/or Social Worker to address care management and care coordination needs related to COPD and DMII as evidenced by adherence to care management team scheduled appointments     demonstrate Ongoing adherence to prescribed treatment plan for COPD and DMII as evidenced by patient/caregiver report or review of chart take all medications exactly as prescribed and will call provider for medication related questions as evidenced by Patient/caregiver report or review of chart.     Interventions:   COPD Interventions: Assessed social determinant of health barriers Advised daughter to reschedule pulmonology appointment Confirmed with daughter that patient has pulse ox and is monitoring pulse oximetry. Offered educational resources- daughter reports not needed and declines.    Diabetes Interventions: Assessed patient's understanding of A1c goal: <8% Reviewed medications with patient and discussed importance of medication adherence Screening for signs and symptoms of depression related to chronic disease state  Assessed social determinant of health barriers Lab Results  Component Value Date   HGBA1C 8.2 (H) 10/29/2023    Patient Self-Care Activities:  Attend all scheduled provider appointments Call provider office for new concerns or questions  Take  medications as prescribed    Plan:  Telephone follow up appointment with care management team member scheduled for:  02/04/2024 at 2 pm        Please call the Suicide and Crisis Lifeline: 988 call the [place]  National Suicide Prevention Lifeline: 316-417-3830 or TTY: 930-343-4924 TTY 310-721-6049) to talk to a trained counselor if you are experiencing a Mental Health or Behavioral Health Crisis or need someone to talk to.  Caregiververbalized understanding of Care plan and visit instructions communicated this visit. Patient/caregiver agrees to review care plan and patient instructions.   Heddy Shutter, RN, MSN, BSN, CCM [place]  [facility], Population Health Case Manager Phone: [phone]

## 2024-01-23 NOTE — Patient Outreach (Signed)
 Complex Care Management   Visit Note  01/23/2024  Name:  Brian [name] MRN: [id number] DOB: 10-20-1931  Situation: Referral received for Complex Care Management related to COPD and Diabetes with complications. I obtained verbal consent from Brian [name] (dpr/daughter).  Visit completed with Brian [name]  on the phone  Background:   Past Medical History:  Diagnosis Date   AAA (abdominal aortic aneurysm)    Arthritis    Asthma 1976   Bronchiolitis 06/2014   CAD (coronary artery disease)    NSTEMI 02/2010;  s/p CABG 1/12 (L-LAD, SOM1, S-PDA); echo in 08/2010: Mild LVH, EF 55%, grade 2 diastolic dysfunction, mild MR, mild LAE, mildly reduced RV function, mild RAE, PASP 42   Carpal tunnel syndrome of left wrist    Cecal angiodysplasia 04/04/2022   Colon polyps    COPD (chronic obstructive pulmonary disease) (HCC)    Diabetes mellitus 2000   Type II, controlls with diet   Dyspnea    ED (erectile dysfunction)    Gout    History of radiation therapy 03/09/2020-03/16/2020   SBRT right lung: Dr. Lynwood Nasuti   HTN (hypertension)    Hyperlipidemia    Inguinal hernia    Right   Macular degeneration    Myocardial infarction [facility]) [phone]   Neuropathy     Assessment: Spoke with daughter/dpr Brian [name]. Daughter reports patient is doing well. Denies any additional GI bleeding and states he is taking all medications as prescribed. Continues to wear O2 at 6L Nasal cannula. Reports using Freestyle Libre and BS are well controlled. Daughter is not with patient at this time and is unable to report BS readings. Patient Reported Symptoms:  Cognitive Cognitive Status: Requires Assistance Decision Making, Other: (forgetful, poor short term memory)   Health Maintenance Behaviors: Annual physical exam, Healthy diet Health Facilitated by: Healthy diet, Rest  Neurological Neurological Review of Symptoms: No symptoms reported    HEENT HEENT Symptoms Reported: No symptoms reported       Cardiovascular Cardiovascular Symptoms Reported: No symptoms reported Does patient have uncontrolled Hypertension?: No Cardiovascular Management Strategies: Medication therapy, Adequate rest, Routine screening, Weight management  Respiratory Respiratory Symptoms Reported: No symptoms reported Other Respiratory Symptoms: shortness of breath with activity. Wears O2 continuous Respiratory Management Strategies: Oxygen  therapy  Endocrine Endocrine Symptoms Reported: No symptoms reported Is patient diabetic?: Yes Is patient checking blood sugars at home?: Yes    Gastrointestinal Gastrointestinal Symptoms Reported: No symptoms reported      Genitourinary Genitourinary Symptoms Reported: No symptoms reported    Integumentary Integumentary Symptoms Reported: No symptoms reported    Musculoskeletal Musculoskelatal Symptoms Reviewed: Unsteady gait Additional Musculoskeletal Details: daughter states he uses his walker. When he is up and ambulating, someone is with patient at all times. Musculoskeletal Management Strategies: Medical device, Routine screening, Adequate rest Musculoskeletal Self-Management Outcome: 4 (good) Falls in the past year?: No Patient at Risk for Falls Due to: Impaired balance/gait Fall risk Follow up: Education provided  Psychosocial Psychosocial Symptoms Reported: No symptoms reported (Daughter reports no signs/symptoms of anxiety or depression)     Quality of Family Relationships: supportive Do you feel physically threatened by others?:  (Patient not available)   Today's Vitals   01/23/24 1518  BP: 138/70   Pain Scale: 0-10 Pain Score: 0-No pain  Medications Reviewed Today     Reviewed by Jahn Franchini M, RN (Registered Nurse) on 01/23/24 at 1535  Med List Status: <None>   Medication Order Taking? Sig Documenting Provider Last Dose  Status Informant  albuterol  (VENTOLIN  HFA) 108 (90 Base) MCG/ACT inhaler [id number] Yes Inhale 2 puffs into the lungs every 6  (six) hours as needed for wheezing or shortness of breath. Geofm Glade PARAS, MD  Active Family Member  ANORO ELLIPTA  62.5-25 MCG/ACT AEPB [id number] Yes USE 1 INHALATION DAILY Geofm Glade PARAS, MD  Active Family Member  Carboxymethylcellulose Sodium 0.25 % SOLN [id number] Yes Place 1 drop into both eyes every 4 (four) hours. [provider]  Active Family Member  Cholecalciferol  50 MCG (2000 UT) TABS [id number]  Take 2,000 Units by mouth in the morning.  Patient not taking: Reported on 01/23/2024   [provider]  Active Family Member  Continuous Glucose Receiver [facility] G7 RECEIVER) DEVI [id number]  UAD to monitor sugars  E11.22, N18.4  Patient not taking: Reported on 01/23/2024   Geofm Glade PARAS, MD  Active   Continuous Glucose Sensor (DEXCOM G7 SENSOR) [place] [id number]  UAD to monitor sugars - IDDM, E11.22, N18.4  Patient not taking: Reported on 01/23/2024   Geofm Glade PARAS, MD  Active   cyanocobalamin  (VITAMIN B12) 500 MCG tablet [id number] Yes Take 1,000 mcg by mouth daily. [provider]  Active Family Member  empagliflozin  (JARDIANCE ) 10 MG TABS tablet [id number] Yes Take 1 tablet (10 mg total) by mouth daily before breakfast. Geofm Glade PARAS, MD  Active   escitalopram  (LEXAPRO ) 20 MG tablet [id number] Yes Take 20 mg by mouth in the morning. [provider]  Active Family Member  ferrous sulfate  325 (65 FE) MG tablet [id number] Yes Take 1 tablet (325 mg total) by mouth daily with breakfast. Geofm Glade PARAS, MD  Active Family Member  furosemide  (LASIX ) 40 MG tablet [id number] Yes Take 1 tablet (40 mg total) by mouth daily.  Patient taking differently: Take 1 tablet (40 mg total) by mouth daily.   Verlin Lonni BIRCH, MD  Active   insulin  glargine (LANTUS ) 100 UNIT/ML Solostar Pen [id number] Yes Inject 14 Units into the skin daily. Inject 14 Units into the skin daily. Geofm Glade PARAS, MD  Active   insulin  lispro (HUMALOG ) 100 UNIT/ML injection [id number] Yes Inject 2-4 units  into the skin 3 times a day before meals.  < 150  0 units, 151-200  2 units,  201-250  3 units, >250    4 units Burns, Glade PARAS, MD  Active   Insulin  Pen Needle 32G X 6 MM MISC [id number]  UAD for daily lantus  injections and humalog  injections tid prn Geofm Glade PARAS, MD  Active   memantine  (NAMENDA ) 10 MG tablet [id number] Yes Take 5 mg by mouth in the morning and at bedtime. [provider]  Active Family Member  mirtazapine  (REMERON ) 15 MG tablet [id number] Yes Take 0.5 tablets (7.5 mg total) by mouth at bedtime. Geofm Glade PARAS, MD  Active Family Member  octreotide  (SANDOSTATIN  LAR) 20 MG injection [id number] Yes Inject 20 mg into the muscle every 30 (thirty) days. Jillian Buttery, MD  Active   OXYGEN  [id number] Yes Inhale 6 L/min into the lungs continuous. [provider]  Active Family Member  pantoprazole  (PROTONIX ) 40 MG tablet [id number] Yes Take 1 tablet (40 mg total) by mouth daily. Geofm Glade PARAS, MD  Active   predniSONE  (DELTASONE ) 10 MG tablet [id number]  Take 1 tablet (10 mg total) by mouth daily with breakfast. Take 1.5 tablets (15mg  total) daily with breakfast  Patient not taking: Reported on 01/23/2024   Hope Almarie ORN, NP  Active   predniSONE  (DELTASONE ) 10  MG tablet [id number] Yes Alternate 10 mg and 15 mg by mouth every other day. Hope Almarie ORN, NP  Active   simvastatin  (ZOCOR ) 40 MG tablet [id number]  Take 1 tablet (40 mg total) by mouth daily.  Patient not taking: Reported on 01/23/2024   Geofm Glade PARAS, MD  Active Family Member  Med List Note Leobardo Garre, CPhT 08/28/23 0130): PATIENT HAS DIFFICULTY SWALLOWING LARGE PILLS; PLEASE BREAK IN HALF. All local 30 day or less are filled at [facility] on [address].          Recommendation:   Daughter to reschedule office visit with pulmonology.   Follow Up Plan:   Telephone follow up appointment date/time:  02/04/24 @ 2pm  Heddy Shutter, RN, MSN, BSN, CCM [place]  [facility], Population  Health Case Manager Phone: [phone]

## 2024-01-31 ENCOUNTER — Telehealth: Payer: Self-pay

## 2024-01-31 NOTE — Telephone Encounter (Signed)
 Patient was identified as falling into the [place] Measure - Diabetes.   Patient was: Appointment already scheduled for:  02/24/2022. No earlier dates available.

## 2024-01-31 NOTE — Telephone Encounter (Signed)
 Patient was identified as falling into the [place] Measure - Diabetes.   Patient was: Attribution and/or data issue.  Validation/Investigation needed.  Explanation:  Patient attributed to [facility] but sees PCP Harlene Hope. Appt made with Dr Hope

## 2024-02-04 ENCOUNTER — Other Ambulatory Visit: Payer: Self-pay

## 2024-02-04 NOTE — Patient Instructions (Signed)
 Visit Information  Thank you for taking time to visit with me today. Please don't hesitate to contact me if I can be of assistance to you before our next scheduled appointment.  Your next care management appointment is by telephone on 03/06/24 at 3:00 pm  Please call the care guide team at [phone] if you need to cancel, schedule, or reschedule an appointment.   Please call the Suicide and Crisis Lifeline: 988 call the [place]  National Suicide Prevention Lifeline: 540 612 2872 or TTY: 816-154-7489 TTY 925-866-1871) to talk to a trained counselor if you are experiencing a Mental Health or Behavioral Health Crisis or need someone to talk to.  Heddy Shutter, RN, MSN, BSN, CCM [place]  [facility], Population Health Case Manager Phone: [phone]

## 2024-02-04 NOTE — Patient Outreach (Signed)
 Complex Care Management   Visit Note  02/04/2024  Name:  Brian [name] MRN: [id number] DOB: [birth year]  Situation: Referral received for Complex Care Management related to COPD and Diabetes with Complications I obtained verbal consent from [name] Coopman(daughter/dpr).  Visit completed with [facility](daughter/dpr)  on the phone  Background:   Past Medical History:  Diagnosis Date   AAA (abdominal aortic aneurysm)    Arthritis    Asthma 1976   Bronchiolitis 06/2014   CAD (coronary artery disease)    NSTEMI 02/2010;  s/p CABG 1/12 (L-LAD, SOM1, S-PDA); echo in 08/2010: Mild LVH, EF 55%, grade 2 diastolic dysfunction, mild MR, mild LAE, mildly reduced RV function, mild RAE, PASP 42   Carpal tunnel syndrome of left wrist    Cecal angiodysplasia 04/04/2022   Colon polyps    COPD (chronic obstructive pulmonary disease) (HCC)    Diabetes mellitus 2000   Type II, controlls with diet   Dyspnea    ED (erectile dysfunction)    Gout    History of radiation therapy 03/09/2020-03/16/2020   SBRT right lung: Dr. Lynwood Nasuti   HTN (hypertension)    Hyperlipidemia    Inguinal hernia    Right   Macular degeneration    Myocardial infarction [facility]) 1 -13-2012   Neuropathy     Assessment: Patient Reported Symptoms:  Cognitive Cognitive Status: Requires Assistance Decision Making (per daughter, forgetful, poor short term memory)      Neurological Neurological Review of Symptoms: No symptoms reported    HEENT HEENT Symptoms Reported: No symptoms reported      Cardiovascular Cardiovascular Symptoms Reported: No symptoms reported Does patient have uncontrolled Hypertension?: No Weight: 153 lb (69.4 kg) (per daughter checked today)  Respiratory Respiratory Symptoms Reported: No symptoms reported Other Respiratory Symptoms: continues to wear O2 at 6L/[place]. per daughter O2 sat 92-94%. She denies any questions or concerns regarding patient respiratory status. Daughter reports she will  schedule missed follow up appointment with pulmonology Respiratory Management Strategies: Oxygen  therapy Respiratory Self-Management Outcome: 4 (good)  Endocrine Endocrine Symptoms Reported: No symptoms reported Is patient diabetic?: Yes Is patient checking blood sugars at home?: Yes List most recent blood sugar readings, include date and time of day: BS this am was 106. currently only needing short acting at lunch Endocrine Self-Management Outcome: 4 (good)  Gastrointestinal Gastrointestinal Symptoms Reported: No symptoms reported Additional Gastrointestinal Details: denies any signs/symptoms of GI bleed      Genitourinary Genitourinary Symptoms Reported: No symptoms reported    Integumentary Integumentary Symptoms Reported: No symptoms reported    Musculoskeletal Musculoskelatal Symptoms Reviewed: Unsteady gait        Psychosocial Psychosocial Symptoms Reported: Not assessed         Today's Vitals   02/04/24 1447  BP: 138/78  Pulse: 70  Weight: 153 lb (69.4 kg)     Medications Reviewed Today     Reviewed by Kissy Cielo M, RN (Registered Nurse) on 02/04/24 at 1434  Med List Status: <None>   Medication Order Taking? Sig Documenting Provider Last Dose Status Informant  albuterol  (VENTOLIN  HFA) 108 (90 Base) MCG/ACT inhaler [id number] Yes Inhale 2 puffs into the lungs every 6 (six) hours as needed for wheezing or shortness of breath. Geofm Glade PARAS, MD  Active Family Member  ANORO ELLIPTA  62.5-25 MCG/ACT AEPB [id number] Yes USE 1 INHALATION DAILY Geofm Glade PARAS, MD  Active Family Member  Carboxymethylcellulose Sodium 0.25 % SOLN [id number] Yes Place 1 drop into both eyes every 4 (four) hours.  [provider]  Active Family Member  Cholecalciferol  50 MCG (2000 UT) TABS [id number]  Take 2,000 Units by mouth in the morning.  Patient not taking: Reported on 02/04/2024   [provider]  Active Family Member  Continuous Glucose Receiver [facility] G7 RECEIVER) DEVI  [id number]  UAD to monitor sugars  E11.22, N18.4  Patient not taking: Reported on 02/04/2024   Geofm Glade PARAS, MD  Active   Continuous Glucose Sensor (DEXCOM G7 SENSOR) [place] [id number] Yes UAD to monitor sugars - IDDM, E11.22, N18.4 Geofm Glade PARAS, MD  Active   cyanocobalamin  (VITAMIN B12) 500 MCG tablet [id number] Yes Take 1,000 mcg by mouth daily. [provider]  Active Family Member  empagliflozin  (JARDIANCE ) 10 MG TABS tablet [id number] Yes Take 1 tablet (10 mg total) by mouth daily before breakfast. Geofm Glade PARAS, MD  Active   escitalopram  (LEXAPRO ) 20 MG tablet [id number] Yes Take 20 mg by mouth in the morning. [provider]  Active Family Member  ferrous sulfate  325 (65 FE) MG tablet [id number] Yes Take 1 tablet (325 mg total) by mouth daily with breakfast. Geofm Glade PARAS, MD  Active Family Member  furosemide  (LASIX ) 40 MG tablet [id number] Yes Take 1 tablet (40 mg total) by mouth daily. Verlin Lonni BIRCH, MD  Active   insulin  glargine (LANTUS ) 100 UNIT/ML Solostar Pen [id number] Yes Inject 14 Units into the skin daily. Inject 14 Units into the skin daily. Geofm Glade PARAS, MD  Active   insulin  lispro (HUMALOG ) 100 UNIT/ML injection [id number] Yes Inject 2-4 units into the skin 3 times a day before meals.  < 150  0 units, 151-200  2 units,  201-250  3 units, >250    4 units Burns, Glade PARAS, MD  Active   Insulin  Pen Needle 32G X 6 MM MISC [id number]  UAD for daily lantus  injections and humalog  injections tid prn Geofm Glade PARAS, MD  Active   memantine  (NAMENDA ) 10 MG tablet [id number] Yes Take 5 mg by mouth in the morning and at bedtime. [provider]  Active Family Member  mirtazapine  (REMERON ) 15 MG tablet [id number] Yes Take 0.5 tablets (7.5 mg total) by mouth at bedtime. Geofm Glade PARAS, MD  Active Family Member  octreotide  (SANDOSTATIN  LAR) 20 MG injection [id number] Yes Inject 20 mg into the muscle every 30 (thirty) days. Jillian Buttery, MD  Active   OXYGEN  [id number]  Yes Inhale 6 L/min into the lungs continuous. [provider]  Active Family Member  pantoprazole  (PROTONIX ) 40 MG tablet [id number] Yes Take 1 tablet (40 mg total) by mouth daily. Geofm Glade PARAS, MD  Active   predniSONE  (DELTASONE ) 10 MG tablet [id number]  Take 1 tablet (10 mg total) by mouth daily with breakfast. Take 1.5 tablets (15mg  total) daily with breakfast  Patient not taking: Reported on 01/23/2024   Hope Almarie ORN, NP  Active   predniSONE  (DELTASONE ) 10 MG tablet [id number] Yes Alternate 10 mg and 15 mg by mouth every other day. Hope Almarie ORN, NP  Active   simvastatin  (ZOCOR ) 40 MG tablet [id number]  Take 1 tablet (40 mg total) by mouth daily.  Patient not taking: Reported on 02/04/2024   Geofm Glade PARAS, MD  Active Family Member  Med List Note Leobardo Garre, CPhT 08/28/23 0130): PATIENT HAS DIFFICULTY SWALLOWING LARGE PILLS; PLEASE BREAK IN HALF. All local 30 day or less are filled at [facility] on [address].          Recommendation:   Continue Current  Plan of Care  Follow Up Plan:   Telephone follow up appointment date/time:  03/07/23 at 3:00 pm  Heddy Shutter, RN, MSN, BSN, CCM [place]  [facility], Population Health Case Manager Phone: [phone]

## 2024-02-18 ENCOUNTER — Encounter: Payer: Self-pay | Admitting: Internal Medicine

## 2024-02-18 NOTE — Progress Notes (Signed)
 "     Subjective:    Patient ID: Brian [name], male    DOB: 08-May-1931, 89 y.o.   MRN: [id number]     HPI Brian [name] is here for follow up of his chronic medical problems.  She is here with her daughter.  Lantus  14 units daily, Humalog  SS  2-4 units B4 meals  Gets humalog  at lunch time.  Typically does not get any other time.  Eating very healthy.  On 6 L at home.  Sometimes 5 L of O2.  Occasionally gets into 80's but comes up quickly  Overall doing well.  Medications and allergies reviewed with patient and updated if appropriate.  Medications Ordered Prior to Encounter[1]   Review of Systems  Constitutional:  Negative for appetite change and unexpected weight change.  Respiratory:  Positive for cough (productive, clear), shortness of breath and wheezing.   Cardiovascular:  Negative for chest pain, palpitations and leg swelling.  Gastrointestinal:  Negative for abdominal pain and blood in stool.  Neurological:  Negative for dizziness, light-headedness and headaches.  Psychiatric/Behavioral:  Negative for sleep disturbance.        Objective:   Vitals:   02/25/24 1443  BP: 120/70  Pulse: 71  Temp: 97.8 F (36.6 C)  SpO2: 93%   BP Readings from Last 3 Encounters:  02/25/24 120/70  02/04/24 138/78  01/23/24 138/70   Wt Readings from Last 3 Encounters:  02/25/24 164 lb 3.2 oz (74.5 kg)  02/04/24 153 lb (69.4 kg)  01/09/24 159 lb (72.1 kg)   Body mass index is 23.56 kg/m.    Physical Exam Constitutional:      General: He is not in acute distress.    Appearance: Normal appearance. He is not ill-appearing.  HENT:     Head: Normocephalic and atraumatic.  Eyes:     Conjunctiva/sclera: Conjunctivae normal.  Cardiovascular:     Rate and Rhythm: Normal rate and regular rhythm.     Heart sounds: Normal heart sounds.  Pulmonary:     Effort: Pulmonary effort is normal. No respiratory distress.     Breath sounds: No wheezing or rales.     Comments: Diffusely  decreased breath sounds-stable Musculoskeletal:     Right lower leg: Edema (Trace) present.     Left lower leg: Edema (Trace) present.  Skin:    General: Skin is warm and dry.     Findings: No rash.  Neurological:     Mental Status: He is alert. Mental status is at baseline.  Psychiatric:        Mood and Affect: Mood normal.        Lab Results  Component Value Date   WBC 8.1 12/17/2023   HGB 9.7 (L) 12/17/2023   HCT 30.5 (L) 12/17/2023   PLT 170.0 12/17/2023   GLUCOSE 154 (H) 01/09/2024   CHOL 163 04/01/2023   TRIG 134.0 04/01/2023   HDL 68.20 04/01/2023   LDLCALC 68 04/01/2023   ALT 12 01/09/2024   AST 9 01/09/2024   NA 141 01/09/2024   K 5.3 (H) 01/09/2024   CL 99 01/09/2024   CREATININE 3.28 (H) 01/09/2024   BUN 52 (H) 01/09/2024   CO2 27 01/09/2024   TSH 1.24 09/28/2022   PSA 1.42 07/16/2007   INR 1.2 01/21/2019   HGBA1C 8.2 (H) 10/29/2023   MICROALBUR 9.45 03/19/2018     Assessment & Plan:    See Problem List for Assessment and Plan of chronic medical problems.   [facility] and will have labs done then    [1]  Current Outpatient Medications on File Prior to Visit  Medication Sig Dispense Refill   albuterol  (VENTOLIN  HFA) 108 (90 Base) MCG/ACT inhaler Inhale 2 puffs into the lungs every 6 (six) hours as needed for wheezing or shortness of breath. 18 g 4   ANORO ELLIPTA  62.5-25 MCG/ACT AEPB USE 1 INHALATION DAILY 180 each 3   Carboxymethylcellulose Sodium 0.25 % SOLN Place 1 drop into both eyes every 4 (four) hours.     cyanocobalamin  (VITAMIN B12) 500 MCG tablet Take 1,000 mcg by mouth daily.     escitalopram  (LEXAPRO ) 20 MG tablet Take 20 mg by mouth in the morning.     ferrous sulfate  325 (65 FE) MG tablet Take 1 tablet (325 mg total) by mouth daily with breakfast. 90 tablet 1   furosemide  (LASIX ) 40 MG tablet Take 1 tablet (40 mg total) by mouth daily. 90 tablet 3   insulin  glargine (LANTUS ) 100 UNIT/ML Solostar Pen Inject 14 Units into  the skin daily. Inject 14 Units into the skin daily. 15 mL 2   insulin  lispro (HUMALOG ) 100 UNIT/ML injection Inject 2-4 units into the skin 3 times a day before meals.  < 150  0 units, 151-200  2 units,  201-250  3 units, >250    4 units 10 mL 11   Insulin  Pen Needle 32G X 6 MM MISC UAD for daily lantus  injections and humalog  injections tid prn 100 each 2   JARDIANCE  10 MG TABS tablet TAKE 1 TABLET DAILY BEFORE BREAKFAST 30 tablet 11   memantine  (NAMENDA ) 10 MG tablet Take 5 mg by mouth in the morning and at bedtime.     mirtazapine  (REMERON ) 15 MG tablet Take 0.5 tablets (7.5 mg total) by mouth at bedtime.     octreotide  (SANDOSTATIN  LAR) 20 MG injection Inject 20 mg into the muscle every 30 (thirty) days. 1 each 0   OXYGEN  Inhale 6 L/min into the lungs continuous.     pantoprazole  (PROTONIX ) 40 MG tablet Take 1 tablet (40 mg total) by mouth daily. 90 tablet 1   predniSONE  (DELTASONE ) 10 MG tablet Alternate 10 mg and 15 mg by mouth every other day. 37.5 tablet 2   No current facility-administered medications on file prior to visit.   "

## 2024-02-18 NOTE — Patient Instructions (Addendum)
" ° ° ° ° °  Medications changes include :   None      Return in about 6 months (around 08/24/2024) for follow up, cancel Feb appt.  "

## 2024-02-20 ENCOUNTER — Other Ambulatory Visit: Payer: Self-pay | Admitting: Internal Medicine

## 2024-02-20 DIAGNOSIS — E1122 Type 2 diabetes mellitus with diabetic chronic kidney disease: Secondary | ICD-10-CM

## 2024-02-25 ENCOUNTER — Ambulatory Visit: Admitting: Internal Medicine

## 2024-02-25 VITALS — BP 120/70 | HR 71 | Temp 97.8°F | Ht 70.0 in | Wt 164.2 lb

## 2024-02-25 DIAGNOSIS — D500 Iron deficiency anemia secondary to blood loss (chronic): Secondary | ICD-10-CM | POA: Diagnosis not present

## 2024-02-25 DIAGNOSIS — Z794 Long term (current) use of insulin: Secondary | ICD-10-CM

## 2024-02-25 DIAGNOSIS — I152 Hypertension secondary to endocrine disorders: Secondary | ICD-10-CM | POA: Diagnosis not present

## 2024-02-25 DIAGNOSIS — N184 Chronic kidney disease, stage 4 (severe): Secondary | ICD-10-CM

## 2024-02-25 DIAGNOSIS — F01A0 Vascular dementia, mild, without behavioral disturbance, psychotic disturbance, mood disturbance, and anxiety: Secondary | ICD-10-CM | POA: Diagnosis not present

## 2024-02-25 DIAGNOSIS — G479 Sleep disorder, unspecified: Secondary | ICD-10-CM | POA: Diagnosis not present

## 2024-02-25 DIAGNOSIS — E1159 Type 2 diabetes mellitus with other circulatory complications: Secondary | ICD-10-CM

## 2024-02-25 DIAGNOSIS — E1122 Type 2 diabetes mellitus with diabetic chronic kidney disease: Secondary | ICD-10-CM

## 2024-02-25 DIAGNOSIS — F419 Anxiety disorder, unspecified: Secondary | ICD-10-CM

## 2024-02-25 DIAGNOSIS — E785 Hyperlipidemia, unspecified: Secondary | ICD-10-CM

## 2024-02-25 DIAGNOSIS — E1169 Type 2 diabetes mellitus with other specified complication: Secondary | ICD-10-CM | POA: Diagnosis not present

## 2024-02-25 LAB — POCT GLYCOSYLATED HEMOGLOBIN (HGB A1C)
HbA1c POC (<> result, manual entry): 8 %
HbA1c, POC (controlled diabetic range): 8 % — AB (ref 0.0–7.0)
HbA1c, POC (prediabetic range): 8 % — AB (ref 5.7–6.4)
Hemoglobin A1C: 8 % — AB (ref 4.0–5.6)

## 2024-02-25 NOTE — Assessment & Plan Note (Signed)
 Chronic Stable  On memantine  5 mg bid

## 2024-02-25 NOTE — Assessment & Plan Note (Signed)
 Chronic Following with nephrology-Dr. Gearline He does appear to be euvolemic Continue Lasix  40 mg daily, jardiance  10 mg daily

## 2024-02-25 NOTE — Assessment & Plan Note (Signed)
 Chronic Associated with CKD stage 4  Lab Results  Component Value Date   HGBA1C 8.0 (A) 02/25/2024   HGBA1C 8.0 02/25/2024   HGBA1C 8.0 (A) 02/25/2024   HGBA1C 8.0 (A) 02/25/2024   On chronic prednisone  Sugars adequately controlled for age-A1c 8.0% today Continue jardiance  10 mg daily, lantus  14 units daily  Start humalog  2-4 units depending on sugar prior to meals Dexcom monitor

## 2024-02-25 NOTE — Assessment & Plan Note (Signed)
 Chronic Controlled  Continue Lexapro  20 mg daily, Remeron  7.5 mg at bedtime

## 2024-02-25 NOTE — Assessment & Plan Note (Signed)
 Chronic No longer taking any statin/cholesterol medication given age, comorbidities

## 2024-02-25 NOTE — Assessment & Plan Note (Signed)
 Chronic Blood pressure well-controlled  Only on Lasix  40 mg daily Monitor BP at home

## 2024-02-25 NOTE — Assessment & Plan Note (Signed)
 Chronic Related to recurrent GI bleed - AVMs No symptoms consistent with active bleeding Doing well on octreotide  injections Q 30 days

## 2024-02-25 NOTE — Assessment & Plan Note (Signed)
 Chronic Sleeping good, often takes a nap during the day but night sleep is good Currently taking mirtazapine  7.5 mg at bedtime

## 2024-03-02 ENCOUNTER — Telehealth: Payer: Self-pay | Admitting: *Deleted

## 2024-03-02 NOTE — Telephone Encounter (Signed)
 CALLED PATIENT TO INFORM OF CT FOR 05-15-24- ARRIVAL TIME- 12:15 PM @ [facility], NO RESTRICTIONS TO SCAN, PATIENT TO RECEIVE RESULTS FROM DR. KINARD ON 05-25-24 @ 11 AM , SPOKE WITH PATIENT'S DAUGHTER - FALEISA MILLER AND SHE IS AWARE OF THESE APPTS. AND THE INSTRUCTIONS

## 2024-03-06 ENCOUNTER — Telehealth: Payer: Self-pay

## 2024-03-06 NOTE — Patient Instructions (Signed)
 Brian [name] - I am sorry I was unable to reach you today for our scheduled appointment. I work with Geofm, Glade PARAS, MD and am calling to support your healthcare needs. Please contact me at [phone] at your earliest convenience. I look forward to speaking with you soon.   Thank you,   Heddy Shutter, RN, MSN, BSN, CCM [place]  [facility], Population Health Case Manager Phone: [phone]

## 2024-03-18 ENCOUNTER — Other Ambulatory Visit: Payer: Self-pay

## 2024-03-25 ENCOUNTER — Ambulatory Visit: Admitting: Internal Medicine

## 2024-04-01 ENCOUNTER — Telehealth

## 2024-05-15 ENCOUNTER — Ambulatory Visit (HOSPITAL_COMMUNITY)

## 2024-05-20 ENCOUNTER — Encounter: Admitting: Internal Medicine

## 2024-05-20 ENCOUNTER — Ambulatory Visit

## 2024-05-25 ENCOUNTER — Ambulatory Visit: Payer: Self-pay | Admitting: Radiation Oncology
